# Patient Record
Sex: Male | Born: 1940 | Race: Black or African American | Hispanic: No | Smoking: Current some day smoker
Health system: Southern US, Community
[De-identification: ages and names within clinical notes are randomized; demographics above are authoritative.]

## PROBLEM LIST (undated history)

## (undated) DIAGNOSIS — Z8709 Personal history of other diseases of the respiratory system: Secondary | ICD-10-CM

## (undated) DIAGNOSIS — R0602 Shortness of breath: Secondary | ICD-10-CM

## (undated) DIAGNOSIS — R200 Anesthesia of skin: Secondary | ICD-10-CM

## (undated) DIAGNOSIS — I10 Essential (primary) hypertension: Secondary | ICD-10-CM

## (undated) DIAGNOSIS — J449 Chronic obstructive pulmonary disease, unspecified: Secondary | ICD-10-CM

## (undated) DIAGNOSIS — F419 Anxiety disorder, unspecified: Secondary | ICD-10-CM

## (undated) DIAGNOSIS — Z9289 Personal history of other medical treatment: Secondary | ICD-10-CM

## (undated) DIAGNOSIS — I5030 Unspecified diastolic (congestive) heart failure: Secondary | ICD-10-CM

## (undated) DIAGNOSIS — I4891 Unspecified atrial fibrillation: Secondary | ICD-10-CM

## (undated) DIAGNOSIS — D649 Anemia, unspecified: Secondary | ICD-10-CM

## (undated) DIAGNOSIS — R918 Other nonspecific abnormal finding of lung field: Secondary | ICD-10-CM

## (undated) DIAGNOSIS — I272 Pulmonary hypertension, unspecified: Secondary | ICD-10-CM

## (undated) DIAGNOSIS — J45909 Unspecified asthma, uncomplicated: Secondary | ICD-10-CM

## (undated) HISTORY — PX: EYE SURGERY: SHX253

## (undated) HISTORY — PX: NOSE SURGERY: SHX723

## (undated) HISTORY — PX: HERNIA REPAIR: SHX51

## (undated) HISTORY — PX: OTHER SURGICAL HISTORY: SHX169

## (undated) HISTORY — DX: Anemia, unspecified: D64.9

## (undated) HISTORY — DX: Chronic obstructive pulmonary disease, unspecified: J44.9

## (undated) HISTORY — DX: Unspecified atrial fibrillation: I48.91

## (undated) HISTORY — PX: TENDON REPAIR: SHX5111

---

## 2003-01-06 ENCOUNTER — Emergency Department (HOSPITAL_COMMUNITY): Admission: EM | Admit: 2003-01-06 | Discharge: 2003-01-06 | Payer: Self-pay | Admitting: Emergency Medicine

## 2003-01-07 ENCOUNTER — Emergency Department (HOSPITAL_COMMUNITY): Admission: EM | Admit: 2003-01-07 | Discharge: 2003-01-07 | Payer: Self-pay | Admitting: Emergency Medicine

## 2003-01-08 ENCOUNTER — Emergency Department (HOSPITAL_COMMUNITY): Admission: EM | Admit: 2003-01-08 | Discharge: 2003-01-08 | Payer: Self-pay | Admitting: Emergency Medicine

## 2003-01-27 ENCOUNTER — Ambulatory Visit (HOSPITAL_COMMUNITY): Admission: RE | Admit: 2003-01-27 | Discharge: 2003-01-27 | Payer: Self-pay | Admitting: General Surgery

## 2003-03-03 ENCOUNTER — Emergency Department (HOSPITAL_COMMUNITY): Admission: EM | Admit: 2003-03-03 | Discharge: 2003-03-03 | Payer: Self-pay | Admitting: Emergency Medicine

## 2003-08-18 ENCOUNTER — Emergency Department (HOSPITAL_COMMUNITY): Admission: EM | Admit: 2003-08-18 | Discharge: 2003-08-18 | Payer: Self-pay | Admitting: Emergency Medicine

## 2004-06-10 ENCOUNTER — Encounter (INDEPENDENT_AMBULATORY_CARE_PROVIDER_SITE_OTHER): Payer: Self-pay | Admitting: Internal Medicine

## 2004-06-10 LAB — CONVERTED CEMR LAB
Cholesterol: 135 mg/dL
Triglycerides: 89 mg/dL

## 2005-06-27 ENCOUNTER — Ambulatory Visit (HOSPITAL_COMMUNITY): Admission: RE | Admit: 2005-06-27 | Discharge: 2005-06-27 | Payer: Self-pay | Admitting: General Surgery

## 2005-06-30 ENCOUNTER — Ambulatory Visit (HOSPITAL_COMMUNITY): Admission: RE | Admit: 2005-06-30 | Discharge: 2005-06-30 | Payer: Self-pay | Admitting: General Surgery

## 2005-06-30 ENCOUNTER — Ambulatory Visit: Payer: Self-pay | Admitting: Cardiology

## 2006-01-24 ENCOUNTER — Ambulatory Visit: Payer: Self-pay | Admitting: Internal Medicine

## 2006-01-24 LAB — CONVERTED CEMR LAB
BUN: 11 mg/dL
Basophils Absolute: 0 10*3/uL
Basophils Relative: 1 %
Calcium: 9.6 mg/dL
Creatinine, Ser: 1.03 mg/dL
Eosinophils Absolute: 0.2 10*3/uL
Eosinophils Relative: 4 %
HCT: 40.5 %
Hemoglobin: 12.6 g/dL
MCV: 89.4 fL
Monocytes Absolute: 0.8 10*3/uL
RBC count: 4.53 10*6/uL
RBC: 4.53 M/uL
WBC, blood: 4.6 10*3/uL
WBC: 4.6 10*3/uL

## 2006-02-13 ENCOUNTER — Encounter: Payer: Self-pay | Admitting: Internal Medicine

## 2006-02-13 DIAGNOSIS — D649 Anemia, unspecified: Secondary | ICD-10-CM

## 2006-02-13 DIAGNOSIS — J309 Allergic rhinitis, unspecified: Secondary | ICD-10-CM | POA: Insufficient documentation

## 2006-02-13 DIAGNOSIS — I4891 Unspecified atrial fibrillation: Secondary | ICD-10-CM | POA: Insufficient documentation

## 2006-02-13 DIAGNOSIS — I10 Essential (primary) hypertension: Secondary | ICD-10-CM | POA: Insufficient documentation

## 2006-03-20 ENCOUNTER — Ambulatory Visit: Payer: Self-pay | Admitting: Internal Medicine

## 2006-03-27 ENCOUNTER — Ambulatory Visit: Payer: Self-pay | Admitting: Internal Medicine

## 2006-04-03 ENCOUNTER — Ambulatory Visit: Payer: Self-pay | Admitting: Internal Medicine

## 2006-04-03 LAB — CONVERTED CEMR LAB: Prothrombin Time: 26.1 s

## 2006-04-12 ENCOUNTER — Ambulatory Visit (HOSPITAL_COMMUNITY): Admission: RE | Admit: 2006-04-12 | Discharge: 2006-04-12 | Payer: Self-pay | Admitting: Internal Medicine

## 2006-04-12 ENCOUNTER — Ambulatory Visit: Payer: Self-pay | Admitting: Internal Medicine

## 2006-05-03 ENCOUNTER — Encounter (INDEPENDENT_AMBULATORY_CARE_PROVIDER_SITE_OTHER): Payer: Self-pay | Admitting: Family Medicine

## 2006-05-03 ENCOUNTER — Ambulatory Visit: Payer: Self-pay | Admitting: Internal Medicine

## 2006-05-03 DIAGNOSIS — R109 Unspecified abdominal pain: Secondary | ICD-10-CM | POA: Insufficient documentation

## 2006-05-03 DIAGNOSIS — I509 Heart failure, unspecified: Secondary | ICD-10-CM

## 2006-05-03 LAB — CONVERTED CEMR LAB
INR: 1.3
Prothrombin Time: 14.2 s

## 2006-05-10 ENCOUNTER — Encounter (INDEPENDENT_AMBULATORY_CARE_PROVIDER_SITE_OTHER): Payer: Self-pay | Admitting: Internal Medicine

## 2006-05-11 ENCOUNTER — Ambulatory Visit (HOSPITAL_COMMUNITY): Admission: RE | Admit: 2006-05-11 | Discharge: 2006-05-11 | Payer: Self-pay | Admitting: Internal Medicine

## 2006-05-11 ENCOUNTER — Ambulatory Visit: Payer: Self-pay | Admitting: Internal Medicine

## 2006-05-11 DIAGNOSIS — J441 Chronic obstructive pulmonary disease with (acute) exacerbation: Secondary | ICD-10-CM

## 2006-05-11 DIAGNOSIS — R609 Edema, unspecified: Secondary | ICD-10-CM | POA: Insufficient documentation

## 2006-05-11 LAB — CONVERTED CEMR LAB: INR: 1.1

## 2006-05-14 ENCOUNTER — Encounter (INDEPENDENT_AMBULATORY_CARE_PROVIDER_SITE_OTHER): Payer: Self-pay | Admitting: *Deleted

## 2006-05-21 ENCOUNTER — Ambulatory Visit: Payer: Self-pay | Admitting: Internal Medicine

## 2006-05-21 DIAGNOSIS — F528 Other sexual dysfunction not due to a substance or known physiological condition: Secondary | ICD-10-CM

## 2006-05-21 LAB — CONVERTED CEMR LAB
INR: 1.2
Prothrombin Time: 13.4 s

## 2006-05-22 ENCOUNTER — Telehealth (INDEPENDENT_AMBULATORY_CARE_PROVIDER_SITE_OTHER): Payer: Self-pay | Admitting: *Deleted

## 2006-05-23 ENCOUNTER — Telehealth (INDEPENDENT_AMBULATORY_CARE_PROVIDER_SITE_OTHER): Payer: Self-pay | Admitting: Internal Medicine

## 2006-05-26 ENCOUNTER — Inpatient Hospital Stay (HOSPITAL_COMMUNITY): Admission: EM | Admit: 2006-05-26 | Discharge: 2006-05-31 | Payer: Self-pay | Admitting: Emergency Medicine

## 2006-05-28 ENCOUNTER — Ambulatory Visit: Payer: Self-pay | Admitting: Cardiology

## 2006-05-28 ENCOUNTER — Telehealth (INDEPENDENT_AMBULATORY_CARE_PROVIDER_SITE_OTHER): Payer: Self-pay | Admitting: Internal Medicine

## 2006-05-30 LAB — CONVERTED CEMR LAB
Basophils Absolute: 0 10*3/uL
Basophils Relative: 0 %
Eosinophils Absolute: 0 10*3/uL
Eosinophils Relative: 0 %
HCT: 40.2 %
Lymphocytes Relative: 1 %
MCHC: 30.9 g/dL
Monocytes Absolute: 0.5 10*3/uL
Platelets: 160 10*3/uL
RDW: 14.7 %

## 2006-05-31 LAB — CONVERTED CEMR LAB
Creatinine, Ser: 1.01 mg/dL
GFR calc Af Amer: >60 mL/min
GFR calc non Af Amer: >60 mL/min
Glucose, Bld: 125 mg/dL
INR: 2.1
Sodium: 141 meq/L

## 2006-06-18 ENCOUNTER — Ambulatory Visit: Payer: Self-pay | Admitting: Internal Medicine

## 2006-06-22 ENCOUNTER — Ambulatory Visit: Payer: Self-pay | Admitting: Internal Medicine

## 2006-06-22 DIAGNOSIS — G4700 Insomnia, unspecified: Secondary | ICD-10-CM

## 2006-06-22 LAB — CONVERTED CEMR LAB: INR: 1.5

## 2006-07-03 ENCOUNTER — Telehealth (INDEPENDENT_AMBULATORY_CARE_PROVIDER_SITE_OTHER): Payer: Self-pay | Admitting: Internal Medicine

## 2006-07-05 ENCOUNTER — Encounter (INDEPENDENT_AMBULATORY_CARE_PROVIDER_SITE_OTHER): Payer: Self-pay | Admitting: Internal Medicine

## 2006-07-20 ENCOUNTER — Ambulatory Visit: Payer: Self-pay | Admitting: Internal Medicine

## 2006-08-17 ENCOUNTER — Ambulatory Visit: Payer: Self-pay | Admitting: Internal Medicine

## 2006-08-22 ENCOUNTER — Ambulatory Visit: Payer: Self-pay | Admitting: Internal Medicine

## 2006-08-23 ENCOUNTER — Ambulatory Visit (HOSPITAL_COMMUNITY): Admission: RE | Admit: 2006-08-23 | Discharge: 2006-08-23 | Payer: Self-pay | Admitting: Internal Medicine

## 2006-08-29 ENCOUNTER — Ambulatory Visit: Payer: Self-pay | Admitting: Cardiology

## 2006-08-31 ENCOUNTER — Ambulatory Visit: Payer: Self-pay | Admitting: Internal Medicine

## 2006-08-31 LAB — CONVERTED CEMR LAB: Prothrombin Time: 22.2 s

## 2006-09-10 ENCOUNTER — Ambulatory Visit: Payer: Self-pay | Admitting: Internal Medicine

## 2006-09-10 LAB — CONVERTED CEMR LAB: INR: 2.9

## 2006-10-01 ENCOUNTER — Ambulatory Visit: Payer: Self-pay | Admitting: Internal Medicine

## 2006-10-19 ENCOUNTER — Ambulatory Visit (HOSPITAL_COMMUNITY): Admission: RE | Admit: 2006-10-19 | Discharge: 2006-10-19 | Payer: Self-pay | Admitting: Internal Medicine

## 2006-10-19 ENCOUNTER — Ambulatory Visit: Payer: Self-pay | Admitting: Internal Medicine

## 2006-10-19 LAB — CONVERTED CEMR LAB
INR: 1.7
Prothrombin Time: 16 s

## 2006-11-01 ENCOUNTER — Ambulatory Visit: Payer: Self-pay | Admitting: Internal Medicine

## 2006-11-01 LAB — CONVERTED CEMR LAB
INR: 1.6
Prothrombin Time: 15.7 s

## 2006-11-14 ENCOUNTER — Ambulatory Visit: Payer: Self-pay | Admitting: Family Medicine

## 2006-11-14 LAB — CONVERTED CEMR LAB
INR: 1.2
Prothrombin Time: 13.6 s

## 2006-11-26 ENCOUNTER — Ambulatory Visit: Payer: Self-pay | Admitting: Internal Medicine

## 2006-11-26 LAB — CONVERTED CEMR LAB
INR: 4.6
INR: 4.1 — ABNORMAL HIGH (ref 0.0–1.5)
Prothrombin Time: 42.4 s — ABNORMAL HIGH (ref 11.6–15.2)

## 2006-12-06 ENCOUNTER — Ambulatory Visit: Payer: Self-pay | Admitting: Internal Medicine

## 2006-12-06 LAB — CONVERTED CEMR LAB: Prothrombin Time: 12.9 s

## 2006-12-07 ENCOUNTER — Encounter (INDEPENDENT_AMBULATORY_CARE_PROVIDER_SITE_OTHER): Payer: Self-pay | Admitting: Internal Medicine

## 2006-12-17 ENCOUNTER — Ambulatory Visit: Payer: Self-pay | Admitting: Internal Medicine

## 2006-12-31 ENCOUNTER — Ambulatory Visit: Payer: Self-pay | Admitting: Internal Medicine

## 2007-01-01 ENCOUNTER — Telehealth (INDEPENDENT_AMBULATORY_CARE_PROVIDER_SITE_OTHER): Payer: Self-pay | Admitting: Internal Medicine

## 2007-01-10 ENCOUNTER — Emergency Department (HOSPITAL_COMMUNITY): Admission: EM | Admit: 2007-01-10 | Discharge: 2007-01-10 | Payer: Self-pay | Admitting: Emergency Medicine

## 2007-01-11 ENCOUNTER — Emergency Department (HOSPITAL_COMMUNITY): Admission: EM | Admit: 2007-01-11 | Discharge: 2007-01-11 | Payer: Self-pay | Admitting: Emergency Medicine

## 2007-01-11 ENCOUNTER — Ambulatory Visit: Payer: Self-pay | Admitting: Internal Medicine

## 2007-01-11 LAB — CONVERTED CEMR LAB
INR: 2.6
Prothrombin Time: 19.5 s

## 2007-01-12 ENCOUNTER — Emergency Department (HOSPITAL_COMMUNITY): Admission: EM | Admit: 2007-01-12 | Discharge: 2007-01-12 | Payer: Self-pay | Admitting: *Deleted

## 2007-01-14 ENCOUNTER — Emergency Department (HOSPITAL_COMMUNITY): Admission: EM | Admit: 2007-01-14 | Discharge: 2007-01-14 | Payer: Self-pay | Admitting: Emergency Medicine

## 2007-01-21 ENCOUNTER — Ambulatory Visit: Payer: Self-pay | Admitting: Internal Medicine

## 2007-01-21 LAB — CONVERTED CEMR LAB: INR: 1.9

## 2007-01-22 ENCOUNTER — Telehealth (INDEPENDENT_AMBULATORY_CARE_PROVIDER_SITE_OTHER): Payer: Self-pay | Admitting: *Deleted

## 2007-01-22 ENCOUNTER — Encounter (INDEPENDENT_AMBULATORY_CARE_PROVIDER_SITE_OTHER): Payer: Self-pay | Admitting: Internal Medicine

## 2007-01-22 LAB — CONVERTED CEMR LAB
Calcium: 8.9 mg/dL (ref 8.4–10.5)
Cholesterol: 154 mg/dL (ref 0–200)
Glucose, Bld: 123 mg/dL — ABNORMAL HIGH (ref 70–99)
HDL: 37 mg/dL — ABNORMAL LOW (ref >39)
Sodium: 141 meq/L (ref 135–145)
Total CHOL/HDL Ratio: 4.2

## 2007-01-28 ENCOUNTER — Ambulatory Visit: Payer: Self-pay | Admitting: Internal Medicine

## 2007-02-04 ENCOUNTER — Ambulatory Visit: Payer: Self-pay | Admitting: Internal Medicine

## 2007-02-04 LAB — CONVERTED CEMR LAB: INR: 1.6

## 2007-02-15 ENCOUNTER — Ambulatory Visit: Payer: Self-pay | Admitting: Internal Medicine

## 2007-02-26 ENCOUNTER — Encounter (INDEPENDENT_AMBULATORY_CARE_PROVIDER_SITE_OTHER): Payer: Self-pay | Admitting: Internal Medicine

## 2007-03-06 ENCOUNTER — Ambulatory Visit: Payer: Self-pay | Admitting: Internal Medicine

## 2007-03-06 LAB — CONVERTED CEMR LAB
INR: 2.5
Prothrombin Time: 19.2 s

## 2007-03-11 ENCOUNTER — Encounter (INDEPENDENT_AMBULATORY_CARE_PROVIDER_SITE_OTHER): Payer: Self-pay | Admitting: Internal Medicine

## 2007-04-03 ENCOUNTER — Encounter (INDEPENDENT_AMBULATORY_CARE_PROVIDER_SITE_OTHER): Payer: Self-pay | Admitting: Internal Medicine

## 2007-04-04 ENCOUNTER — Inpatient Hospital Stay (HOSPITAL_COMMUNITY): Admission: EM | Admit: 2007-04-04 | Discharge: 2007-04-06 | Payer: Self-pay | Admitting: Emergency Medicine

## 2007-04-04 ENCOUNTER — Ambulatory Visit: Payer: Self-pay | Admitting: Internal Medicine

## 2007-04-05 LAB — CONVERTED CEMR LAB
Cholesterol: 146 mg/dL
TSH: 0.394 microintl units/mL
Triglycerides: 61 mg/dL

## 2007-04-06 LAB — CONVERTED CEMR LAB
BUN: 34 mg/dL
Basophils Absolute: 0 10*3/uL
Basophils Relative: 0 %
CO2: 32 meq/L
Chloride: 96 meq/L
Creatinine, Ser: 1.75 mg/dL
Glucose, Bld: 141 mg/dL
MCHC: 32.1 g/dL
Neutro Abs: 10 10*3/uL
Neutrophils Relative %: 92 %
Platelets: 244 10*3/uL
Prothrombin Time: 23.5 s
RBC: 4.03 M/uL
RDW: 14.6 %

## 2007-04-15 ENCOUNTER — Ambulatory Visit: Payer: Self-pay | Admitting: Internal Medicine

## 2007-04-15 LAB — CONVERTED CEMR LAB
ALT: 31 units/L (ref 0–53)
AST: 23 units/L (ref 0–37)
BUN: 19 mg/dL (ref 6–23)
Basophils Absolute: 0 10*3/uL (ref 0.0–0.1)
Basophils Relative: 0 % (ref 0–1)
CO2: 36 meq/L — ABNORMAL HIGH (ref 19–32)
Calcium: 9.5 mg/dL (ref 8.4–10.5)
Creatinine, Ser: 1.08 mg/dL (ref 0.40–1.50)
Eosinophils Relative: 3 % (ref 0–5)
HCT: 40.4 % (ref 39.0–52.0)
Hemoglobin: 13 g/dL (ref 13.0–17.0)
MCHC: 32.3 g/dL (ref 30.0–36.0)
Monocytes Absolute: 1.3 10*3/uL — ABNORMAL HIGH (ref 0.1–1.0)
Monocytes Relative: 14 % — ABNORMAL HIGH (ref 3–12)
Prothrombin Time: 46.4 s — ABNORMAL HIGH (ref 11.6–15.2)
RBC: 4.46 M/uL (ref 4.22–5.81)
RDW: 14.8 % (ref 11.5–15.5)
Total Bilirubin: 0.7 mg/dL (ref 0.3–1.2)

## 2007-04-22 ENCOUNTER — Ambulatory Visit: Payer: Self-pay | Admitting: Internal Medicine

## 2007-05-06 ENCOUNTER — Ambulatory Visit: Payer: Self-pay | Admitting: Internal Medicine

## 2007-05-06 LAB — CONVERTED CEMR LAB
Bilirubin Urine: NEGATIVE
INR: 3.4
Protein, U semiquant: NEGATIVE
Prothrombin Time: 22.4 s
Urobilinogen, UA: NEGATIVE

## 2007-05-24 ENCOUNTER — Ambulatory Visit: Payer: Self-pay | Admitting: Internal Medicine

## 2007-05-24 LAB — CONVERTED CEMR LAB: INR: 1.6

## 2007-06-03 ENCOUNTER — Ambulatory Visit: Payer: Self-pay | Admitting: Internal Medicine

## 2007-06-03 LAB — CONVERTED CEMR LAB: Prothrombin Time: 17.7 s

## 2007-06-21 ENCOUNTER — Ambulatory Visit: Payer: Self-pay | Admitting: Internal Medicine

## 2007-06-21 LAB — CONVERTED CEMR LAB: INR: 1.5

## 2007-07-08 ENCOUNTER — Ambulatory Visit: Payer: Self-pay | Admitting: Internal Medicine

## 2007-07-24 ENCOUNTER — Ambulatory Visit: Payer: Self-pay | Admitting: Internal Medicine

## 2007-07-24 DIAGNOSIS — R05 Cough: Secondary | ICD-10-CM

## 2007-07-24 LAB — CONVERTED CEMR LAB: INR: 2.3

## 2007-07-25 ENCOUNTER — Ambulatory Visit (HOSPITAL_COMMUNITY): Admission: RE | Admit: 2007-07-25 | Discharge: 2007-07-25 | Payer: Self-pay | Admitting: Ophthalmology

## 2007-08-14 ENCOUNTER — Ambulatory Visit: Payer: Self-pay | Admitting: Internal Medicine

## 2007-08-14 LAB — CONVERTED CEMR LAB
INR: 2.3
Prothrombin Time: 18.6 s

## 2007-08-19 ENCOUNTER — Encounter (INDEPENDENT_AMBULATORY_CARE_PROVIDER_SITE_OTHER): Payer: Self-pay | Admitting: Internal Medicine

## 2007-09-04 ENCOUNTER — Ambulatory Visit: Payer: Self-pay | Admitting: Internal Medicine

## 2007-09-04 LAB — CONVERTED CEMR LAB
INR: 3
Prothrombin Time: 21 s

## 2007-09-09 ENCOUNTER — Encounter (INDEPENDENT_AMBULATORY_CARE_PROVIDER_SITE_OTHER): Payer: Self-pay | Admitting: Internal Medicine

## 2007-09-26 ENCOUNTER — Ambulatory Visit: Payer: Self-pay | Admitting: Internal Medicine

## 2007-10-10 ENCOUNTER — Ambulatory Visit: Payer: Self-pay | Admitting: Internal Medicine

## 2007-10-28 ENCOUNTER — Ambulatory Visit: Payer: Self-pay | Admitting: Internal Medicine

## 2007-10-28 LAB — CONVERTED CEMR LAB: INR: 3.9

## 2007-11-01 ENCOUNTER — Encounter (INDEPENDENT_AMBULATORY_CARE_PROVIDER_SITE_OTHER): Payer: Self-pay | Admitting: Internal Medicine

## 2007-11-11 ENCOUNTER — Ambulatory Visit: Payer: Self-pay | Admitting: Internal Medicine

## 2007-11-25 ENCOUNTER — Ambulatory Visit: Payer: Self-pay | Admitting: Internal Medicine

## 2007-11-27 ENCOUNTER — Encounter (INDEPENDENT_AMBULATORY_CARE_PROVIDER_SITE_OTHER): Payer: Self-pay | Admitting: Internal Medicine

## 2007-12-16 ENCOUNTER — Ambulatory Visit: Payer: Self-pay | Admitting: Internal Medicine

## 2007-12-17 ENCOUNTER — Encounter (INDEPENDENT_AMBULATORY_CARE_PROVIDER_SITE_OTHER): Payer: Self-pay | Admitting: Internal Medicine

## 2007-12-18 ENCOUNTER — Encounter (INDEPENDENT_AMBULATORY_CARE_PROVIDER_SITE_OTHER): Payer: Self-pay | Admitting: Internal Medicine

## 2007-12-18 LAB — CONVERTED CEMR LAB
BUN: 12 mg/dL (ref 6–23)
CO2: 23 meq/L (ref 19–32)
Chloride: 106 meq/L (ref 96–112)
Glucose, Bld: 91 mg/dL (ref 70–99)
Potassium: 4.2 meq/L (ref 3.5–5.3)

## 2007-12-28 ENCOUNTER — Emergency Department (HOSPITAL_COMMUNITY): Admission: EM | Admit: 2007-12-28 | Discharge: 2007-12-28 | Payer: Self-pay | Admitting: Emergency Medicine

## 2008-01-08 ENCOUNTER — Telehealth (INDEPENDENT_AMBULATORY_CARE_PROVIDER_SITE_OTHER): Payer: Self-pay | Admitting: Internal Medicine

## 2008-01-10 ENCOUNTER — Encounter (INDEPENDENT_AMBULATORY_CARE_PROVIDER_SITE_OTHER): Payer: Self-pay | Admitting: Internal Medicine

## 2008-01-15 ENCOUNTER — Ambulatory Visit: Payer: Self-pay | Admitting: Internal Medicine

## 2008-01-23 ENCOUNTER — Ambulatory Visit (HOSPITAL_COMMUNITY): Admission: RE | Admit: 2008-01-23 | Discharge: 2008-01-23 | Payer: Self-pay | Admitting: Ophthalmology

## 2008-01-29 ENCOUNTER — Ambulatory Visit: Payer: Self-pay | Admitting: Internal Medicine

## 2008-02-12 ENCOUNTER — Ambulatory Visit: Payer: Self-pay | Admitting: Internal Medicine

## 2008-02-26 ENCOUNTER — Ambulatory Visit: Payer: Self-pay | Admitting: Internal Medicine

## 2008-02-26 ENCOUNTER — Ambulatory Visit (HOSPITAL_COMMUNITY): Admission: RE | Admit: 2008-02-26 | Discharge: 2008-02-26 | Payer: Self-pay | Admitting: Internal Medicine

## 2008-02-26 DIAGNOSIS — R0602 Shortness of breath: Secondary | ICD-10-CM

## 2008-02-26 LAB — CONVERTED CEMR LAB: INR: 1.3

## 2008-03-03 ENCOUNTER — Ambulatory Visit: Payer: Self-pay | Admitting: Internal Medicine

## 2008-03-03 LAB — CONVERTED CEMR LAB: INR: 1.5

## 2008-03-16 ENCOUNTER — Emergency Department (HOSPITAL_COMMUNITY): Admission: EM | Admit: 2008-03-16 | Discharge: 2008-03-16 | Payer: Self-pay | Admitting: Emergency Medicine

## 2008-03-17 ENCOUNTER — Ambulatory Visit: Payer: Self-pay | Admitting: Internal Medicine

## 2008-03-17 ENCOUNTER — Ambulatory Visit (HOSPITAL_COMMUNITY): Admission: RE | Admit: 2008-03-17 | Discharge: 2008-03-17 | Payer: Self-pay | Admitting: Internal Medicine

## 2008-03-17 DIAGNOSIS — Z9189 Other specified personal risk factors, not elsewhere classified: Secondary | ICD-10-CM

## 2008-03-17 LAB — CONVERTED CEMR LAB: INR: 1.5

## 2008-03-23 ENCOUNTER — Encounter (INDEPENDENT_AMBULATORY_CARE_PROVIDER_SITE_OTHER): Payer: Self-pay | Admitting: Internal Medicine

## 2008-04-02 ENCOUNTER — Ambulatory Visit: Payer: Self-pay | Admitting: Internal Medicine

## 2008-04-17 ENCOUNTER — Ambulatory Visit: Payer: Self-pay | Admitting: Cardiology

## 2008-04-17 ENCOUNTER — Inpatient Hospital Stay (HOSPITAL_COMMUNITY): Admission: EM | Admit: 2008-04-17 | Discharge: 2008-04-21 | Payer: Self-pay | Admitting: Emergency Medicine

## 2008-04-20 ENCOUNTER — Encounter (INDEPENDENT_AMBULATORY_CARE_PROVIDER_SITE_OTHER): Payer: Self-pay | Admitting: Family Medicine

## 2008-04-21 LAB — CONVERTED CEMR LAB
BUN: 29 mg/dL
Basophils Absolute: 0 10*3/uL
Basophils Relative: 0 %
CO2: 34 meq/L
Chloride: 98 meq/L
Cholesterol: 116 mg/dL
Creatinine, Ser: 1.35 mg/dL
Eosinophils Absolute: 0 10*3/uL
Glucose, Bld: 128 mg/dL
HDL: 16 mg/dL
MCHC: 32.1 g/dL
MCV: 92.3 fL
Monocytes Relative: 2 %
Neutro Abs: 18.4 10*3/uL
Neutrophils Relative %: 97 %
Platelets: 458 10*3/uL
Potassium: 4.4 meq/L
RBC: 3.92 M/uL
RDW: 14.8 %
Total CHOL/HDL Ratio: 7.3
Triglycerides: 78 mg/dL

## 2008-04-24 ENCOUNTER — Ambulatory Visit: Payer: Self-pay | Admitting: Internal Medicine

## 2008-04-24 LAB — CONVERTED CEMR LAB
INR: 3.4
TSH: 0.618 microintl units/mL

## 2008-04-30 ENCOUNTER — Ambulatory Visit: Payer: Self-pay | Admitting: Cardiology

## 2008-05-06 ENCOUNTER — Ambulatory Visit (HOSPITAL_COMMUNITY): Admission: RE | Admit: 2008-05-06 | Discharge: 2008-05-06 | Payer: Self-pay | Admitting: Cardiology

## 2008-05-08 ENCOUNTER — Ambulatory Visit: Payer: Self-pay | Admitting: Internal Medicine

## 2008-05-08 DIAGNOSIS — M545 Low back pain, unspecified: Secondary | ICD-10-CM | POA: Insufficient documentation

## 2008-05-22 ENCOUNTER — Ambulatory Visit: Payer: Self-pay | Admitting: Internal Medicine

## 2008-06-05 ENCOUNTER — Ambulatory Visit: Payer: Self-pay | Admitting: Internal Medicine

## 2008-06-05 LAB — CONVERTED CEMR LAB: INR: 2.6

## 2008-06-25 ENCOUNTER — Ambulatory Visit: Payer: Self-pay | Admitting: Family Medicine

## 2008-06-25 ENCOUNTER — Encounter (INDEPENDENT_AMBULATORY_CARE_PROVIDER_SITE_OTHER): Payer: Self-pay | Admitting: Internal Medicine

## 2008-06-25 LAB — CONVERTED CEMR LAB: INR: 2.8

## 2008-07-23 ENCOUNTER — Ambulatory Visit: Payer: Self-pay | Admitting: Internal Medicine

## 2008-07-23 LAB — CONVERTED CEMR LAB: INR: 2

## 2008-08-20 ENCOUNTER — Ambulatory Visit: Payer: Self-pay | Admitting: Internal Medicine

## 2008-09-03 ENCOUNTER — Ambulatory Visit: Payer: Self-pay | Admitting: Internal Medicine

## 2008-09-03 LAB — CONVERTED CEMR LAB: INR: 5

## 2008-09-17 ENCOUNTER — Ambulatory Visit: Payer: Self-pay | Admitting: Internal Medicine

## 2008-10-01 ENCOUNTER — Ambulatory Visit: Payer: Self-pay | Admitting: Internal Medicine

## 2008-10-01 DIAGNOSIS — M6530 Trigger finger, unspecified finger: Secondary | ICD-10-CM | POA: Insufficient documentation

## 2008-10-01 LAB — CONVERTED CEMR LAB: INR: 5.5

## 2008-10-02 ENCOUNTER — Encounter (INDEPENDENT_AMBULATORY_CARE_PROVIDER_SITE_OTHER): Payer: Self-pay | Admitting: Internal Medicine

## 2008-10-02 LAB — CONVERTED CEMR LAB
BUN: 11 mg/dL (ref 6–23)
Basophils Absolute: 0 10*3/uL (ref 0.0–0.1)
Basophils Relative: 1 % (ref 0–1)
CO2: 26 meq/L (ref 19–32)
Calcium: 9 mg/dL (ref 8.4–10.5)
Chloride: 105 meq/L (ref 96–112)
Creatinine, Ser: 1.07 mg/dL (ref 0.40–1.50)
Eosinophils Relative: 3 % (ref 0–5)
HCT: 39.6 % (ref 39.0–52.0)
Hemoglobin: 12.2 g/dL — ABNORMAL LOW (ref 13.0–17.0)
MCHC: 30.8 g/dL (ref 30.0–36.0)
Monocytes Absolute: 0.7 10*3/uL (ref 0.1–1.0)
Neutro Abs: 2.1 10*3/uL (ref 1.7–7.7)
Platelets: 213 10*3/uL (ref 150–400)
RDW: 13.8 % (ref 11.5–15.5)

## 2008-10-14 ENCOUNTER — Ambulatory Visit: Payer: Self-pay | Admitting: Internal Medicine

## 2008-10-28 ENCOUNTER — Ambulatory Visit: Payer: Self-pay | Admitting: Internal Medicine

## 2008-10-28 LAB — CONVERTED CEMR LAB: INR: 2.9

## 2008-11-16 ENCOUNTER — Encounter (INDEPENDENT_AMBULATORY_CARE_PROVIDER_SITE_OTHER): Payer: Self-pay | Admitting: Internal Medicine

## 2008-11-18 ENCOUNTER — Ambulatory Visit: Payer: Self-pay | Admitting: Internal Medicine

## 2008-11-18 LAB — CONVERTED CEMR LAB: INR: 3.1

## 2008-11-26 ENCOUNTER — Encounter (INDEPENDENT_AMBULATORY_CARE_PROVIDER_SITE_OTHER): Payer: Self-pay | Admitting: Internal Medicine

## 2008-12-02 ENCOUNTER — Ambulatory Visit: Payer: Self-pay | Admitting: Internal Medicine

## 2008-12-02 LAB — CONVERTED CEMR LAB: INR: 2.6

## 2008-12-07 ENCOUNTER — Encounter (INDEPENDENT_AMBULATORY_CARE_PROVIDER_SITE_OTHER): Payer: Self-pay | Admitting: Internal Medicine

## 2008-12-09 ENCOUNTER — Ambulatory Visit: Payer: Self-pay | Admitting: Cardiology

## 2008-12-16 ENCOUNTER — Inpatient Hospital Stay (HOSPITAL_COMMUNITY): Admission: EM | Admit: 2008-12-16 | Discharge: 2008-12-17 | Payer: Self-pay | Admitting: Emergency Medicine

## 2008-12-16 ENCOUNTER — Encounter (INDEPENDENT_AMBULATORY_CARE_PROVIDER_SITE_OTHER): Payer: Self-pay | Admitting: *Deleted

## 2008-12-16 LAB — CONVERTED CEMR LAB
Brain Natriuretic Peptide: 503
CK-MB: 2.7 ng/mL
Calcium: 9.4 mg/dL
Chloride: 102 meq/L
Glomerular Filtration Rate, Af Am: >60 mL/min/{1.73_m2}
MCV: 90.9 fL
Platelets: 164 10*3/uL
Potassium: 4 meq/L
Sodium: 139 meq/L
Troponin I: <0.05 ng/mL

## 2008-12-17 ENCOUNTER — Encounter: Payer: Self-pay | Admitting: Family Medicine

## 2008-12-24 ENCOUNTER — Encounter (INDEPENDENT_AMBULATORY_CARE_PROVIDER_SITE_OTHER): Payer: Self-pay | Admitting: *Deleted

## 2008-12-24 ENCOUNTER — Ambulatory Visit: Payer: Self-pay | Admitting: Cardiology

## 2008-12-24 LAB — CONVERTED CEMR LAB: POC INR: 5.8

## 2008-12-28 ENCOUNTER — Ambulatory Visit: Payer: Self-pay | Admitting: Cardiology

## 2008-12-28 LAB — CONVERTED CEMR LAB: POC INR: 1.9

## 2009-01-13 ENCOUNTER — Ambulatory Visit: Payer: Self-pay | Admitting: Cardiology

## 2009-01-29 ENCOUNTER — Ambulatory Visit: Payer: Self-pay | Admitting: Cardiology

## 2009-02-15 ENCOUNTER — Encounter: Payer: Self-pay | Admitting: Family Medicine

## 2009-03-04 ENCOUNTER — Ambulatory Visit: Payer: Self-pay | Admitting: Cardiology

## 2009-03-24 ENCOUNTER — Ambulatory Visit: Payer: Self-pay | Admitting: Cardiology

## 2009-03-24 ENCOUNTER — Ambulatory Visit: Payer: Self-pay | Admitting: Family Medicine

## 2009-03-25 ENCOUNTER — Encounter (INDEPENDENT_AMBULATORY_CARE_PROVIDER_SITE_OTHER): Payer: Self-pay | Admitting: *Deleted

## 2009-03-25 ENCOUNTER — Inpatient Hospital Stay (HOSPITAL_COMMUNITY): Admission: EM | Admit: 2009-03-25 | Discharge: 2009-03-27 | Payer: Self-pay | Admitting: Emergency Medicine

## 2009-03-25 ENCOUNTER — Ambulatory Visit: Payer: Self-pay | Admitting: Cardiology

## 2009-03-26 ENCOUNTER — Encounter (INDEPENDENT_AMBULATORY_CARE_PROVIDER_SITE_OTHER): Payer: Self-pay | Admitting: *Deleted

## 2009-03-26 ENCOUNTER — Encounter: Payer: Self-pay | Admitting: Cardiology

## 2009-03-26 ENCOUNTER — Encounter (INDEPENDENT_AMBULATORY_CARE_PROVIDER_SITE_OTHER): Payer: Self-pay | Admitting: Internal Medicine

## 2009-03-26 LAB — CONVERTED CEMR LAB
Albumin: 3.6 g/dL
Alkaline Phosphatase: 95 units/L
BUN: 13 mg/dL
CO2: 31 meq/L
Calcium: 9.2 mg/dL
Chloride: 102 meq/L
Creatinine, Ser: 1.13 mg/dL
Glucose, Bld: 126 mg/dL
Potassium: 5.5 meq/L
Total Protein: 7.6 g/dL

## 2009-03-27 ENCOUNTER — Encounter (INDEPENDENT_AMBULATORY_CARE_PROVIDER_SITE_OTHER): Payer: Self-pay | Admitting: *Deleted

## 2009-03-27 LAB — CONVERTED CEMR LAB
BUN: 20 mg/dL
Calcium: 9.4 mg/dL
Glomerular Filtration Rate, Af Am: >60 mL/min/{1.73_m2}
Potassium: 4.2 meq/L

## 2009-04-12 ENCOUNTER — Encounter (INDEPENDENT_AMBULATORY_CARE_PROVIDER_SITE_OTHER): Payer: Self-pay | Admitting: *Deleted

## 2009-04-12 ENCOUNTER — Encounter: Payer: Self-pay | Admitting: Family Medicine

## 2009-04-15 ENCOUNTER — Ambulatory Visit: Payer: Self-pay | Admitting: Cardiology

## 2009-04-15 LAB — CONVERTED CEMR LAB: POC INR: 2.3

## 2009-04-20 ENCOUNTER — Ambulatory Visit: Payer: Self-pay | Admitting: Cardiology

## 2009-04-20 ENCOUNTER — Ambulatory Visit (HOSPITAL_COMMUNITY): Admission: RE | Admit: 2009-04-20 | Discharge: 2009-04-20 | Payer: Self-pay | Admitting: Cardiology

## 2009-04-20 LAB — CONVERTED CEMR LAB
BUN: 17 mg/dL (ref 6–23)
Creatinine, Ser: 1.36 mg/dL (ref 0.40–1.50)
Potassium: 3.9 meq/L (ref 3.5–5.3)
Pro B Natriuretic peptide (BNP): 479.8 pg/mL — ABNORMAL HIGH (ref 0.0–100.0)

## 2009-04-23 ENCOUNTER — Encounter (INDEPENDENT_AMBULATORY_CARE_PROVIDER_SITE_OTHER): Payer: Self-pay | Admitting: *Deleted

## 2009-05-13 ENCOUNTER — Ambulatory Visit: Payer: Self-pay | Admitting: Cardiology

## 2009-05-13 LAB — CONVERTED CEMR LAB: POC INR: 2.3

## 2009-06-14 ENCOUNTER — Encounter (INDEPENDENT_AMBULATORY_CARE_PROVIDER_SITE_OTHER): Payer: Self-pay | Admitting: *Deleted

## 2009-06-22 ENCOUNTER — Ambulatory Visit: Payer: Self-pay | Admitting: Family Medicine

## 2009-06-23 LAB — CONVERTED CEMR LAB
Calcium: 9.5 mg/dL (ref 8.4–10.5)
Chloride: 104 meq/L (ref 96–112)
Creatinine, Ser: 0.96 mg/dL (ref 0.40–1.50)
Sodium: 141 meq/L (ref 135–145)

## 2009-06-25 ENCOUNTER — Emergency Department (HOSPITAL_COMMUNITY): Admission: EM | Admit: 2009-06-25 | Discharge: 2009-06-25 | Payer: Self-pay | Admitting: Emergency Medicine

## 2009-07-07 ENCOUNTER — Encounter (INDEPENDENT_AMBULATORY_CARE_PROVIDER_SITE_OTHER): Payer: Self-pay | Admitting: Pharmacist

## 2009-07-29 ENCOUNTER — Encounter (INDEPENDENT_AMBULATORY_CARE_PROVIDER_SITE_OTHER): Payer: Self-pay | Admitting: Pharmacist

## 2009-09-04 ENCOUNTER — Emergency Department (HOSPITAL_COMMUNITY): Admission: EM | Admit: 2009-09-04 | Discharge: 2009-09-04 | Payer: Self-pay | Admitting: Emergency Medicine

## 2009-10-13 ENCOUNTER — Encounter (INDEPENDENT_AMBULATORY_CARE_PROVIDER_SITE_OTHER): Payer: Self-pay | Admitting: Pharmacist

## 2009-10-18 ENCOUNTER — Emergency Department (HOSPITAL_COMMUNITY): Admission: EM | Admit: 2009-10-18 | Discharge: 2009-10-18 | Payer: Self-pay | Admitting: Emergency Medicine

## 2009-10-27 ENCOUNTER — Encounter (INDEPENDENT_AMBULATORY_CARE_PROVIDER_SITE_OTHER): Payer: Self-pay | Admitting: Pharmacist

## 2009-11-24 ENCOUNTER — Encounter (INDEPENDENT_AMBULATORY_CARE_PROVIDER_SITE_OTHER): Payer: Self-pay | Admitting: Pharmacist

## 2009-11-26 ENCOUNTER — Ambulatory Visit: Payer: Self-pay | Admitting: Family Medicine

## 2009-11-26 ENCOUNTER — Encounter (INDEPENDENT_AMBULATORY_CARE_PROVIDER_SITE_OTHER): Payer: Self-pay | Admitting: *Deleted

## 2009-11-26 DIAGNOSIS — M109 Gout, unspecified: Secondary | ICD-10-CM

## 2009-11-26 LAB — CONVERTED CEMR LAB
BUN: 11 mg/dL
Calcium: 9.3 mg/dL
Creatinine, Ser: 1.09 mg/dL

## 2009-11-27 ENCOUNTER — Encounter: Payer: Self-pay | Admitting: Family Medicine

## 2009-11-29 ENCOUNTER — Encounter: Payer: Self-pay | Admitting: Family Medicine

## 2009-11-29 LAB — CONVERTED CEMR LAB
Chloride: 107 meq/L (ref 96–112)
Potassium: 4.7 meq/L (ref 3.5–5.3)
Sodium: 145 meq/L (ref 135–145)
Uric Acid, Serum: 9.9 mg/dL — ABNORMAL HIGH (ref 4.0–7.8)

## 2009-12-05 ENCOUNTER — Emergency Department (HOSPITAL_COMMUNITY): Admission: EM | Admit: 2009-12-05 | Discharge: 2009-12-05 | Payer: Self-pay | Admitting: Emergency Medicine

## 2009-12-18 ENCOUNTER — Inpatient Hospital Stay (HOSPITAL_COMMUNITY): Admission: EM | Admit: 2009-12-18 | Discharge: 2009-12-20 | Payer: Self-pay | Admitting: Emergency Medicine

## 2009-12-18 ENCOUNTER — Encounter (INDEPENDENT_AMBULATORY_CARE_PROVIDER_SITE_OTHER): Payer: Self-pay | Admitting: *Deleted

## 2009-12-18 LAB — CONVERTED CEMR LAB: TSH: 0.429 microintl units/mL

## 2009-12-22 ENCOUNTER — Encounter: Payer: Self-pay | Admitting: Physician Assistant

## 2009-12-22 ENCOUNTER — Ambulatory Visit: Payer: Self-pay | Admitting: Family Medicine

## 2009-12-22 DIAGNOSIS — J449 Chronic obstructive pulmonary disease, unspecified: Secondary | ICD-10-CM

## 2009-12-22 DIAGNOSIS — J4489 Other specified chronic obstructive pulmonary disease: Secondary | ICD-10-CM | POA: Insufficient documentation

## 2009-12-23 LAB — CONVERTED CEMR LAB: Prothrombin Time: 18 s — ABNORMAL HIGH (ref 11.6–15.2)

## 2009-12-27 ENCOUNTER — Ambulatory Visit: Payer: Self-pay | Admitting: Cardiology

## 2009-12-27 LAB — CONVERTED CEMR LAB: POC INR: 2.9

## 2010-01-03 ENCOUNTER — Ambulatory Visit: Payer: Self-pay | Admitting: Family Medicine

## 2010-01-05 ENCOUNTER — Encounter: Payer: Self-pay | Admitting: Family Medicine

## 2010-01-10 ENCOUNTER — Ambulatory Visit: Payer: Self-pay | Admitting: Cardiology

## 2010-01-10 LAB — CONVERTED CEMR LAB: POC INR: 2.9

## 2010-01-24 ENCOUNTER — Ambulatory Visit: Payer: Self-pay | Admitting: Family Medicine

## 2010-01-24 DIAGNOSIS — K4090 Unilateral inguinal hernia, without obstruction or gangrene, not specified as recurrent: Secondary | ICD-10-CM | POA: Insufficient documentation

## 2010-01-26 ENCOUNTER — Ambulatory Visit: Payer: Self-pay | Admitting: Cardiology

## 2010-02-04 ENCOUNTER — Encounter (INDEPENDENT_AMBULATORY_CARE_PROVIDER_SITE_OTHER): Payer: Self-pay | Admitting: *Deleted

## 2010-02-04 ENCOUNTER — Ambulatory Visit: Payer: Self-pay | Admitting: Cardiology

## 2010-02-07 ENCOUNTER — Encounter (INDEPENDENT_AMBULATORY_CARE_PROVIDER_SITE_OTHER): Payer: Self-pay | Admitting: *Deleted

## 2010-02-08 LAB — CONVERTED CEMR LAB
AST: 17 units/L (ref 0–37)
Albumin: 3.9 g/dL (ref 3.5–5.2)
Alkaline Phosphatase: 130 units/L — ABNORMAL HIGH (ref 39–117)
BUN: 13 mg/dL (ref 6–23)
Eosinophils Absolute: 0 10*3/uL (ref 0.0–0.7)
INR: 1.11 (ref <1.50)
Lymphocytes Relative: 13 % (ref 12–46)
Lymphs Abs: 0.6 10*3/uL — ABNORMAL LOW (ref 0.7–4.0)
Neutro Abs: 3.7 10*3/uL (ref 1.7–7.7)
Neutrophils Relative %: 82 % — ABNORMAL HIGH (ref 43–77)
Platelets: 324 10*3/uL (ref 150–400)
Potassium: 5.3 meq/L (ref 3.5–5.3)
Prothrombin Time: 14.5 s (ref 11.6–15.2)
WBC: 4.5 10*3/uL (ref 4.0–10.5)

## 2010-02-18 ENCOUNTER — Ambulatory Visit: Payer: Self-pay | Admitting: Cardiology

## 2010-02-23 ENCOUNTER — Ambulatory Visit: Payer: Self-pay | Admitting: Cardiovascular Disease

## 2010-03-04 ENCOUNTER — Ambulatory Visit: Payer: Self-pay | Admitting: Cardiology

## 2010-03-04 ENCOUNTER — Encounter: Payer: Self-pay | Admitting: Cardiology

## 2010-03-23 ENCOUNTER — Ambulatory Visit: Admission: RE | Admit: 2010-03-23 | Discharge: 2010-03-23 | Payer: Self-pay | Source: Home / Self Care

## 2010-03-23 LAB — CONVERTED CEMR LAB: POC INR: 2.7

## 2010-03-25 ENCOUNTER — Ambulatory Visit: Admit: 2010-03-25 | Payer: Self-pay | Admitting: Family Medicine

## 2010-04-03 ENCOUNTER — Encounter: Payer: Self-pay | Admitting: Cardiology

## 2010-04-12 LAB — CONVERTED CEMR LAB
CO2: 28 meq/L (ref 19–32)
Chloride: 102 meq/L (ref 96–112)
Creatinine, Ser: 0.93 mg/dL (ref 0.40–1.50)
Potassium: 4.3 meq/L (ref 3.5–5.3)
Sodium: 139 meq/L (ref 135–145)

## 2010-04-12 NOTE — Assessment & Plan Note (Signed)
Summary: F UP   Vital Signs:  Patient profile:   70 year old male Height:      66 inches Weight:      163.75 pounds BMI:     26.53 Pulse rate:   84 / minute Pulse rhythm:   regular Resp:     16 per minute BP sitting:   160 / 98  (left arm) Cuff size:   regular  Vitals Entered By: Everitt Amber LPN (November 26, 2009 10:57 AM)  Nutrition Counseling: Patient's BMI is greater than 25 and therefore counseled on weight management options. CC: Follow up chronic problems, wants to get rx for cialis or levitra   Primary Care Provider:  Dr. Syliva Overman  CC:  Follow up chronic problems and wants to get rx for cialis or levitra.  History of Present Illness: Reports  that he has been doing fairly well. He was recently in the Ed for treatment of gout affecting his riught great toe, states he was initilly being treated as infection, but when no better re-evaluation established a dx of gout. He has not taken his BP med today, states he does not take when going to MD as it makes hiom "sleep", suggested taking it at bedtime instead, and stressed the importance oftaking at the same time every day. Denies recent fever or chills. Denies sinus pressure, nasal congestion , ear pain or sore throat. Denies chest congestion, or cough productive of sputum. Denies chest pain, palpitations, PND, orthopnea or leg swelling. Denies abdominal pain, nausea, vomitting, diarrhea or constipation. Denies change in bowel movements or bloody stool. Denies dysuria , frequency, incontinence or hesitancy.c/o erectile dysfunction and wANTS A SCRIPT.The complaint is not new. Denies  joint pain, swelling, or reduced mobility. Denies headaches, vertigo, seizures. Denies depression, anxiety or insomnia. Denies  rash, lesions, or itch.     Preventive Screening-Counseling & Management  Alcohol-Tobacco     Smoking Cessation Counseling: yes  Current Medications (verified): 1)  Coumadin 4 Mg Tabs (Warfarin Sodium)  .... 2 By Mouth On Mon & Thurs, 1 By Mouth All Other Days 2)  Furosemide 40 Mg  Tabs (Furosemide) .Marland Kitchen.. 1 By Mouth Once Daily 3)  Metoprolol Tartrate 25 Mg  Tabs (Metoprolol Tartrate) .Marland Kitchen.. 1 By Mouth Two Times A Day 4)  Diltiazem Hcl Er Beads 360 Mg Cp24 (Diltiazem Hcl Er Beads) .Marland Kitchen.. 1 By Mouth Once Daily 5)  Spiriva Handihaler 18 Mcg  Caps (Tiotropium Bromide Monohydrate) .Marland Kitchen.. 1 Puff Once Daily 6)  Potassium Chloride Crys Cr 20 Meq  Tbcr (Potassium Chloride Crys Cr) .Marland Kitchen.. 1 By Mouth Once Daily 7)  Proair Hfa 108 (90 Base) Mcg/act Aers (Albuterol Sulfate) .... 2 Puffs Q 6 Hours As Needed For Shortness of Breath 8)  Albuterol Sulfate (2.5 Mg/81ml) 0.083% Nebu (Albuterol Sulfate) .... One Vial Per Nebulizer Every Six Hours As Needed 9)  Travatan Z 0.004 % Soln (Travoprost) .... One Drop in Each Eye At Bedtime 10)  Alphagan P 0.1 % Soln (Brimonidine Tartrate) .... One Drop in Both Eyes Two Times A Day  Allergies (verified): No Known Drug Allergies  Review of Systems      See HPI Eyes:  Denies discharge, eye pain, and red eye. GU:  Complains of erectile dysfunction, nocturia, and urinary hesitancy. Endo:  Denies cold intolerance, excessive thirst, excessive urination, and heat intolerance. Heme:  Denies abnormal bruising and bleeding. Allergy:  Denies hives or rash and itching eyes.  Physical Exam  General:  Well-developed,well-nourished,in no acute distress; alert,appropriate  and cooperative throughout examination HEENT: No facial asymmetry,  EOMI, No sinus tenderness, TM's Clear, oropharynx  pink and moist.   Chest: Clear to auscultation bilaterally.  CVS: S1, S2, No murmurs, No S3.   Abd: Soft, Nontender.  MS: Adequate ROM spine, hips, shoulders and knees.  Ext: No edema.   CNS: CN 2-12 intact, power tone and sensation normal throughout.   Skin: Intact, no visible lesions or rashes.  Psych: Good eye contact, normal affect.  Memory intact, not anxious or depressed  appearing.    Impression & Recommendations:  Problem # 1:  GOUT (ICD-274.9) Assessment Improved  Orders: T-Uric Acid (Blood) ([id number])  Problem # 2:  C H F (ICD-428.0) Assessment: Improved  His updated medication list for this problem includes:    Coumadin 4 Mg Tabs (Warfarin sodium) .Marland Kitchen... 2 by mouth on mon & thurs, 1 by mouth all other days    Furosemide 40 Mg Tabs (Furosemide) .Marland Kitchen... 1 by mouth once daily    Metoprolol Tartrate 25 Mg Tabs (Metoprolol tartrate) .Marland Kitchen... 1 by mouth two times a day  Problem # 3:  HYPERTENSION (ICD-401.9) Assessment: Deteriorated  His updated medication list for this problem includes:    Furosemide 40 Mg Tabs (Furosemide) .Marland Kitchen... 1 by mouth once daily    Metoprolol Tartrate 25 Mg Tabs (Metoprolol tartrate) .Marland Kitchen... 1 by mouth two times a day    Diltiazem Hcl Er Beads 360 Mg Cp24 (Diltiazem hcl er beads) .Marland Kitchen... 1 by mouth once daily Check your Blood Pressure regularly. If it is above150/95  you should make an appointment.   Orders: T-Basic Metabolic Panel [id number])  BP today: 160/98 Prior BP: 110/74 (06/22/2009)  Labs Reviewed: K+: 4.0 (06/22/2009) Creat: : 0.96 (06/22/2009)   Chol: 116 (04/21/2008)   HDL: 16 (04/21/2008)   LDL: 84 (04/21/2008)   TG: 78 (04/21/2008)  Problem # 4:  ERECTILE DYSFUNCTION (ICD-302.72) Assessment: Unchanged  The following medications were removed from the medication list:    Cialis 20 Mg Tabs (Tadalafil) .Marland Kitchen... Take 1 tablet by mouth once a day as needed His updated medication list for this problem includes:    Cialis 5 Mg Tabs (Tadalafil) .Marland Kitchen... Take 1 tablet by mouth once a day  Problem # 5:  DYSPNEA (ICD-786.05) Assessment: Improved  Problem # 6:  TOBACCO ABUSE-50 PACK YEARS (ICD-305.1) Assessment: Unchanged  Encouraged smoking cessation and discussed different methods for smoking cessation.   Complete Medication List: 1)  Coumadin 4 Mg Tabs (Warfarin sodium) .... 2 by mouth on mon & thurs, 1 by mouth  all other days 2)  Furosemide 40 Mg Tabs (Furosemide) .Marland Kitchen.. 1 by mouth once daily 3)  Metoprolol Tartrate 25 Mg Tabs (Metoprolol tartrate) .Marland Kitchen.. 1 by mouth two times a day 4)  Diltiazem Hcl Er Beads 360 Mg Cp24 (Diltiazem hcl er beads) .Marland Kitchen.. 1 by mouth once daily 5)  Spiriva Handihaler 18 Mcg Caps (Tiotropium bromide monohydrate) .Marland Kitchen.. 1 puff once daily 6)  Potassium Chloride Crys Cr 20 Meq Tbcr (Potassium chloride crys cr) .Marland Kitchen.. 1 by mouth once daily 7)  Proair Hfa 108 (90 Base) Mcg/act Aers (Albuterol sulfate) .... 2 puffs q 6 hours as needed for shortness of breath 8)  Albuterol Sulfate (2.5 Mg/31ml) 0.083% Nebu (Albuterol sulfate) .... One vial per nebulizer every six hours as needed 9)  Travatan Z 0.004 % Soln (Travoprost) .... One drop in each eye at bedtime 10)  Alphagan P 0.1 % Soln (Brimonidine tartrate) .... One drop in both eyes two times a  day 11)  Cialis 5 Mg Tabs (Tadalafil) .... Take 1 tablet by mouth once a day  Other Orders: Influenza Vaccine NON MCR ([id number])  Patient Instructions: 1)  Please schedule a follow-up appointment in 3.5 months. 2)  Tobacco is very bad for your health and your loved ones! You Should stop smoking!. 3)  Stop Smoking Tips: Choose a Quit date. Cut down before the Quit date. decide what you will do as a substitute when you feel the urge to smoke(gum,toothpick,exercise). 4)  Uric acid and chem7 today Prescriptions: CIALIS 5 MG TABS (TADALAFIL) Take 1 tablet by mouth once a day  #30 x 0   Entered by:   Everitt Amber LPN   Authorized by:   Syliva Overman MD   Signed by:   Syliva Overman MD on 11/27/2009   Method used:   Handwritten   RxID:   [id number] PROAIR HFA 108 (90 BASE) MCG/ACT AERS (ALBUTEROL SULFATE) 2 puffs q 6 hours as needed for shortness of breath  #8.5 Gram x 3   Entered by:   Everitt Amber LPN   Authorized by:   Syliva Overman MD   Signed by:   Everitt Amber LPN on 08/65/7846   Method used:   Electronically to        [facility] [address] [phone]* (retail)       [address] [place], [place]  [address]       Ph: [phone]       Fax: [phone]   RxID:   [id number] FUROSEMIDE 40 MG  TABS (FUROSEMIDE) 1 by mouth once daily  #30 x 3   Entered by:   Everitt Amber LPN   Authorized by:   Syliva Overman MD   Signed by:   Everitt Amber LPN on 18/84/1660   Method used:   Electronically to        [facility] [phone]* (retail)       [address] [place]  [address]       Ph: [phone]       Fax: [phone]   RxID:   [id number] DILTIAZEM HCL ER BEADS 360 MG CP24 (DILTIAZEM HCL ER BEADS) 1 by mouth once daily  #30.0 Each x 3   Entered by:   Everitt Amber LPN   Authorized by:   Syliva Overman MD   Signed by:   Everitt Amber LPN on 62/69/4854   Method used:   Electronically to        [facility] [phone]* (retail)       [address] [place], [place]  [address]       Ph: [phone]       Fax: [phone]   RxID:   [id number]     Influenza Vaccine    Vaccine Type: Fluvax Non-MCR    Site: right deltoid    Mfr: novatis    Dose: 0.5 ml    Route: IM    Given by: Everitt Amber LPN    Exp. Date: 07/2010    Lot #: [id number]

## 2010-04-12 NOTE — Letter (Signed)
Summary: Custom - Delinquent Coumadin 1  [facility]  [address], [place] [address]   Phone: [phone]  Fax: [phone]     July 07, 2009 MRN: [id number]   Theodore [name] [address] [place], [place]  [address]   Dear [name],  This letter is being sent to you as a reminder that it is necessary for you to get your INR/PT checked regularly so that we can optimize your care.  Our records indicate that you were scheduled to have a test done recently.  As of today, we have not received the results of this test.  It is very important that you have your INR checked.  Please call our office at the number listed above to schedule an appointment at your earliest convenience.    If you have recently had your protime checked or have discontinued this medication, please contact our office at the above phone number to clarify this issue.  Thank you for this prompt attention to this important health care matter.  Sincerely, Vashti Hey, RN  [facility] Team

## 2010-04-12 NOTE — Letter (Signed)
Summary: MED REVIEW   MED REVIEW   Imported By: Lind Guest 12/23/2009 14:09:01  _____________________________________________________________________  External Attachment:    Type:   Image     Comment:   External Document

## 2010-04-12 NOTE — Letter (Signed)
Summary: Letter  Letter   Imported By: Lind Guest 11/29/2009 14:04:54  _____________________________________________________________________  External Attachment:    Type:   Image     Comment:   External Document

## 2010-04-12 NOTE — Assessment & Plan Note (Signed)
Summary: NEEDS SURG CLEARANCE PER DR.ZIEGLER FOR HERNIA REPAIR/TG   Visit Type:  Follow-up Primary Provider:  Dr. Syliva Overman   History of Present Illness: [name] returns to the office for continued assessment and treatment of chronic atrial fibrillation, chronic obstructive pulmonary disease and congestive heart failure with preserved LV systolic function.  He was admitted to the hospital approximately 2 months ago for vertigo.  Those records were retrieved and reviewed.  MRI of the brain was negative.  As a result of bradycardia with heart rates in the 40s on telemetry, his rate control medications were readjusted.  He had no symptoms referable to bradycardia nor has he had any particular problems since discharge.  EKG  Procedure date:  02/04/2010  Findings:      Atrial fibrillation with a rapid ventricular response Heart rate is 122 bpm Left anterior fascicular block No previous tracing for comparison.  MRI Brain  Procedure date:  12/20/2009  Findings:      MRI of Brain     IMPRESSION:   No acute intracranial abnormality.  Brain is negative aside from   rare evidence of small vessel ischemia in the posterior   circulation, and probable fetal type PCA origins - anatomic   variant.    Read By:  Augusto Gamble,  M.D.  CXR  Procedure date:  12/19/2009  Findings:      Clinical Data: Atrial fibrillation.  Dizziness.    CHEST - 2 VIEW    Comparison: 12/18/2009    Findings: The cardiopericardial silhouette is enlarged. Pulmonary   arteries remain prominent.  The right hilum now has imaging   features very similar to the 06/25/2009 exam suggesting that the   prominence on the most recent comparison study was simply related   to a slight projectional difference.  There is no edema or focal   pneumonia. Telemetry leads overlie the chest.    IMPRESSION:   Cardiomegaly without acute cardiopulmonary findings.    Read By:  Kennith Center,  M.D.   Current  Medications (verified): 1)  Coumadin 4 Mg Tabs (Warfarin Sodium) .... 2 By Mouth On Mon & Thurs, 1 By Mouth All Other Days 2)  Furosemide 40 Mg  Tabs (Furosemide) .Marland Kitchen.. 1 By Mouth Once Every Other Day 3)  Spiriva Handihaler 18 Mcg  Caps (Tiotropium Bromide Monohydrate) .Marland Kitchen.. 1 Puff Once Daily 4)  Potassium Chloride Crys Cr 20 Meq  Tbcr (Potassium Chloride Crys Cr) .Marland Kitchen.. 1 By Mouth Once Daily 5)  Proair Hfa 108 (90 Base) Mcg/act Aers (Albuterol Sulfate) .... 2 Puffs Q 6 Hours As Needed For Shortness of Breath 6)  Albuterol Sulfate (2.5 Mg/27ml) 0.083% Nebu (Albuterol Sulfate) .... One Vial Per Nebulizer Every Six Hours As Needed 7)  Travatan Z 0.004 % Soln (Travoprost) .... One Drop in Each Eye At Bedtime 8)  Cartia Xt 300 Mg Xr24h-Cap (Diltiazem Hcl Coated Beads) .... Take 1 Tablet By Mouth Once A Day 9)  Warfarin Sodium 4 Mg Tabs (Warfarin Sodium) .... Use As Directed 10)  Metoprolol Succinate 50 Mg Xr24h-Tab (Metoprolol Succinate) .... Take One Tablet By Mouth Daily  Allergies (verified): No Known Drug Allergies  Comments:  Nurse/Medical Assistant: patient brought meds and the only diffrence is he no longer has the allopurinol  Past History:  PMH, FH, and Social History reviewed and updated.  Past Medical History: Atrial fibrillation;  coumadin; asymptomatic bradycardia on telemetry in 12/2009 H/o CHF with preserved LV EF; pulmonary hypertension; normal EF in 03/2009 Hypertension Chronic  obstructive pulmonary disease Anemia: Hemoglobin 11.6 in 04/2008->resolved LE edema Tobacco abuse Erectile dysfunction Glaucoma Allergic rhinitis  Review of Systems       The patient complains of dyspnea on exertion.  The patient denies anorexia, vision loss, decreased hearing, hoarseness, chest pain, syncope, peripheral edema, prolonged cough, headaches, abdominal pain, weight loss, weight gain, and hemoptysis.    Vital Signs:  Patient profile:   70 year old male Weight:      156 pounds O2  Sat:      91 % on Room air Pulse rate:   114 / minute BP sitting:   143 / 85  (right arm)  Vitals Entered By: Dreama Saa, CNA (February 04, 2010 10:53 AM)  O2 Flow:  Room air  Physical Exam  General:  Proportionate weight and height; well-developed, well-nourished, in no acute distress; Weight: 156, 11 pounds less then 9 months ago. Neck-No JVD; no carotid bruits: Lungs-No tachypnea, no rales;expiratory rhonchi; no wheezes; prolonged expiratory phase; increased A-P diameter; rales at the base of the left lateral lung field Cardiovascular-rapid, irregular rhythm; normal PMI; normal S1 and S2; prominent S4 Abdomen-BS normal; soft and non-tender without masses or organomegaly; epigastric bruit Musculoskeletal-No deformities, no cyanosis or clubbing: Neurologic-Normal cranial nerves; symmetric strength and tone:  Skin-Warm, excessive dryness and flaking over the lower extremities Extremities- distal pulses intact; no edema:     Impression & Recommendations:  Problem # 1:  INGUINAL HERNIA, LEFT (ICD-550.90) Although [name] has significant comorbidities including atrial arrhythmias and chronic obstructive pulmonary disease, the proposed surgical procedure entails little physiologic stress, and the risk of perioperative complications is perfectly acceptable.  Beta blocker will be started both for its potential cardioprotective effect and for better control of heart rate, and should be continued throughout the operative interval.  Problem # 2:  COUMADIN THERAPY (ICD-V58.61) Risk of thromboembolism is low during a brief discontinuation of anticoagulant therapy.  Warfarin can be stopped 4 days prior to the procedure and resumed the evening of the procedure.  Problem # 3:  ATRIAL FIBRILLATION (ICD-427.31) Rate in atrial fibrillation is excessive.  There was probably no need to reduce his doses of rate control medicine for asymptomatic bradycardia, particularly if it occurred at night.   Metoprolol will be resumed at a decreased dose of 25 mg q.d., and diltiazem increased to a dose lower than he took previously, 300 mg q.d.  A nursing visit in 2 weeks will assess heart rate control.  Problem # 4:  COPD (ICD-496) Patient reports that cigarette consumption has decreased by a factor of 10 to approximately 2-to 3 cigarettes per day.  I suggested that he discontinue tobacco use entirely.  He has previously stopped with the assistance of nicotine patches for as long as one month.  He will use this nicotine replacement modality once again.  Problem # 5:  HYPERTENSION (ICD-401.9) Blood pressure control is good; current medications will be continued.  A return office visit is anticipated in 4 months.  In the interim, anticoagulation will be managed in our clinic.  Stool for Hemoccult testing and a CBC will be obtained as well as his other preoperative laboratory studies.  Other Orders: T-Comprehensive Metabolic Panel [id number]) T-CBC w/Diff [id number]) T-PTT [id number]) T-Protime, Auto ([id number]) Hemoccult Cards (Take Home) (Hemoccult Cards)  Patient Instructions: 1)  Your physician recommends that you schedule a follow-up appointment in: 4 MONTHS 2)  Your physician recommends that you return for lab work in: TODAY 3)  Your physician has recommended you  make the following change in your medication: INCREASE CARTIA TO 300MG  DAILY WITH NEXT REFILL, START METOPROLOL SUCCINATE 50MG  DAILY 4)  You have been referred to NURSE VISIT IN 2 WEEK WITH A RHYTHM STRIP 5)  Your physician has requested that you regularly monitor and record your blood pressure readings at home.  Please use the same machine at the same time of day to check your readings and record them to bring to your follow-up visit. 6)  Your physician has asked that you test your stool for blood. It is necessary to test 3 different stool specimens for accuracy. You will be given 3 hemoccult cards for specimen collection.  For each stool specimen, place a small portion of stool sample (from 2 different areas of the stool) into the 2 squares on the card. Close card. Repeat with 2 more stool specimens. Bring the cards back to the office for testing. 7)  Your physician discussed the hazards of tobacco use.  Tobacco use cessation is recommended and techniques and options to help you quit were discussed. Prescriptions: METOPROLOL SUCCINATE 50 MG XR24H-TAB (METOPROLOL SUCCINATE) Take one tablet by mouth daily  #30 x 3   Entered by:   Teressa Lower RN   Authorized by:   Kathlen Brunswick, MD, [facility]   Signed by:   Teressa Lower RN on 02/04/2010   Method used:   Electronically to        [facility]. [phone]* (retail)       [address] [place], [place]  [address]       Ph: [phone]       Fax: [phone]   RxID:   [id number] CARTIA XT 300 MG XR24H-CAP (DILTIAZEM HCL COATED BEADS) Take 1 tablet by mouth once a day  #30 x 3   Entered by:   Teressa Lower RN   Authorized by:   Kathlen Brunswick, MD, [facility]   Signed by:   Teressa Lower RN on 02/04/2010   Method used:   Electronically to        [facility]. [phone]* (retail)       [address] [place]  [address]       Ph: [phone]       Fax: [phone]   RxID:   [id number]

## 2010-04-12 NOTE — Medication Information (Signed)
Summary: CCN 4MG  TWO TAB T,T,S,S 1 TAB M,W,F  Anticoagulant Therapy  Managed by: Vashti Hey, RN PCP: Dr. Syliva Overman Supervising MD: Diona Browner MD, Remi Deter Indication 1: Atrial Fibrillation (chronic) Lab Used: [facility] [place] Site: [place] INR POC 2.9  Dietary changes: no    Health status changes: no    Bleeding/hemorrhagic complications: no    Recent/future hospitalizations: yes       Details: In [facility] 12/18/09 - 12/20/09 for vertigo    Any missed doses?: yes     Details: Coumadin has been increased to 8mg  qd except 4mg  on M,W,F  Is patient compliant with meds? no     Details: documented non-compliance   Allergies: No Known Drug Allergies  Anticoagulation Management History:      The patient is taking warfarin and comes in today for a routine follow up visit.  He is being anticoagulated because of chronic atrial fibrillation.  Positive risk factors for bleeding include an age of 18 years or older.  Negative risk factors for bleeding include no history of CVA/TIA, no history of GI bleeding, and absence of serious comorbidities.  The bleeding index is 'intermediate risk'.  Positive CHADS2 values include History of CHF and History of HTN.  Negative CHADS2 values include Age > 22 years old, History of Diabetes, and Prior Stroke/CVA/TIA.  Plans are to continue warfarin for life.  His last INR was 1.47.  Anticoagulation responsible provider: Diona Browner MD, Remi Deter.  INR POC: 2.9.  Cuvette Lot#: [id number].  Exp: 10/11.    Anticoagulation Management Assessment/Plan:      The patient's current anticoagulation dose is Coumadin 4 mg tabs: 2 by mouth on Mon & Thurs, 1 by mouth all other days.  The target INR is 2.0-3.0.  The next INR is due 01/10/2010.  Anticoagulation instructions were given to patient.  Results were reviewed/authorized by Vashti Hey, RN.  He was notified by Vashti Hey RN.        Coagulation management information includes: Cell phone [phone].  Prior  Anticoagulation Instructions: INR 2.3 Continue coumadin 1 tablet once daily except 2 tablets on Tuesdays, Thursdays and Saturdays  Current Anticoagulation Instructions: INR 2.9 Decrease dose to 4mg  once daily except 8mg  on Tuesdays, Thursdays and Saturdays Dose was increase to 8mg  once daily except 4mg  on M,W,F last week and INR jumpef from 1.47 to 2.9

## 2010-04-12 NOTE — Miscellaneous (Signed)
  Clinical Lists Changes  Medications: Changed medication from POTASSIUM CHLORIDE CRYS CR 20 MEQ  TBCR (POTASSIUM CHLORIDE CRYS CR) 1 by mouth once daily to POTASSIUM CHLORIDE CRYS CR 20 MEQ  TBCR (POTASSIUM CHLORIDE CRYS CR) 1/2  by mouth once daily - Signed Orders: Added new Test order of T-Basic Metabolic Panel [id number]) - Signed

## 2010-04-12 NOTE — Assessment & Plan Note (Signed)
Summary: post hosp [facility] per pt phone call/tg   Visit Type:  Follow-up Primary Provider:  Dr. 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  Since resuming daily furosemide, Mr. Brockmann has returned to asymptomatic status.  He is relatively sedentary, but walks outside without difficulty.  He denies chest discomfort, lightheadedness or syncope.  He has had no pedal edema.  A recent surveillance colonoscopy was reportedly negative.  Current Medications (verified): 1)  Coumadin 4 Mg Tabs (Warfarin Sodium) .... 2 By Mouth On Mon & Thurs, 1 By Mouth All Other Days 2)  Furosemide 40 Mg  Tabs (Furosemide) .Marland Kitchen.. 1 By Mouth Once Daily 3)  Metoprolol Tartrate 25 Mg  Tabs (Metoprolol Tartrate) .Marland Kitchen.. 1 By Mouth Two Times A Day 4)  Diltiazem Hcl Er Beads 360 Mg Cp24 (Diltiazem Hcl Er Beads) .Marland Kitchen.. 1 By Mouth Once Daily 5)  Spiriva Handihaler 18 Mcg  Caps (Tiotropium Bromide Monohydrate) .Marland Kitchen.. 1 Puff Once Daily 6)  Potassium Chloride Crys Cr 20 Meq  Tbcr (Potassium Chloride Crys Cr) .Marland Kitchen.. 1 By Mouth Once Daily 7)  Proair Hfa 108 (90 Base) Mcg/act Aers (Albuterol Sulfate) .... 2 Puffs Q 6 Hours As Needed For Shortness of Breath 8)  Albuterol Sulfate (2.5 Mg/44ml) 0.083% Nebu (Albuterol Sulfate) .... One Vial Per Nebulizer Every Six Hours As Needed 9)  Cialis 20 Mg Tabs (Tadalafil) .... Take 1 Tablet By Mouth Once A Day As Needed  Allergies (verified): No Known Drug Allergies  Past History:  PMH, FH, and Social History reviewed and updated.  Past Medical History: Atrial fibrillation;  coumadin History of Congestive heart failure with preserved left ventricular systolic function; moderate        pulmonary hypertension; normal EF in 03/2009 Hypertension Chronic  obstructive pulmonary disease Anemia: Hemoglobin 11.6 in 04/2008->resolved LE edema tobacco abuse erectile dysfunction insomnia cataracts glaucoma Allergic rhinitis  Past Surgical History: Right hand surgical procedure for a tendon repair Bilateral cataract surgery Herniorrhaphy  Review of Systems       see history of present illness  Vital Signs:  Patient profile:   70 year old male Weight:      167 pounds Pulse rate:   50 / minute BP sitting:   127 / 61  (right arm)  Physical Exam  General:    Well developed; no acute distress; proportionate weight and height Neck-No JVD; no carotid bruits: Lungs-No tachypnea,few earlier respiratory rales on the left; no rhonchi; no wheezes; slight prolongation of the expiratory phase Cardiovascular-irregular rhythm; normal PMI; split S1 and S2; minimal systolic ejection murmur Abdomen-BS normal; soft and non-tender without masses or organomegaly:  Musculoskeletal-No deformities, no cyanosis or clubbing: Neurologic-Normal cranial nerves; symmetric strength and tone:  Skin-Warm, no significant lesions: Extremities-Nl distal pulses;trace edema:    Impression & Recommendations:  Problem # 1:  C H F (ICD-428.0) Patient has clear diastolic dysfunction with congestive heart failure despite a normal ejection fraction and normal renal function.  He does well with a relatively low dose of furosemide as long as he takes it.  Continuing compliance was encouraged.  A repeat chemistry profile and BNP level will be obtained.  Problem # 2:  ATRIAL FIBRILLATION (ICD-427.31) Heart rate is well controlled.  Chronic anticoagulation is required and has been well tolerated.  Recent CBC and colonoscopy  were normal.  Stool for Hemoccult testing and blood count will be followed annually.  Warfarin dosage will continue to be adjusted in anticoagulation clinic.  Problem # 3:  HYPERTENSION (ICD-401.9) Blood pressure control is excellent; current medication  will be continued.  I will plan to see this nice gentleman again in 6 months.  Other Orders: T-Basic Metabolic Panel [phone]) T-BNP  (B Natriuretic Peptide) [phone]) T-Chest x-ray, 2 views ([id number])   Patient Instructions: 1)  Your physician recommends that you schedule a follow-up appointment in: 6 MONTHS 2)  Your physician recommends that you return for lab work in: TODAY 3)  A chest x-ray takes a picture of the organs and structures inside the chest, including the heart, lungs, and blood vessels. This test can show several things, including, whether the heart is enlarged; whether fluid is building up in the lungs; and whether pacemaker / defibrillator leads are still in place. TODAY

## 2010-04-12 NOTE — Miscellaneous (Signed)
  Clinical Lists Changes  Medications: Added new medication of ALLOPURINOL 300 MG TABS (ALLOPURINOL) Take 1 tablet by mouth once a day - Signed Rx of ALLOPURINOL 300 MG TABS (ALLOPURINOL) Take 1 tablet by mouth once a day;  #30 x 3;  Signed;  Entered by: Syliva Overman MD;  Authorized by: Syliva Overman MD;  Method used: Electronically to [facility] [address] [phone]*, [address], [place], [place]  [address], Ph: [phone], Fax: [phone]    Prescriptions: ALLOPURINOL 300 MG TABS (ALLOPURINOL) Take 1 tablet by mouth once a day  #30 x 3   Entered and Authorized by:   Syliva Overman MD   Signed by:   Syliva Overman MD on 11/27/2009   Method used:   Electronically to        [facility] [address] [phone]* (retail)       [address] [place]  [address]       Ph: [phone]       Fax: [phone]   RxID:   [id number]

## 2010-04-12 NOTE — Letter (Signed)
Summary: Custom - Delinquent Coumadin 2  [facility]  [address] [place] [address]   Phone: [phone]  Fax: [phone]     October 27, 2009 MRN: [id number]   Theodore [name] [address] [place], [place]  [address]   Dear [name],  We have attempted to contact you by phone and letter on multiple occasions to contact our office for important blood work associated with the blood thinner, warfarin (Coumadin).  Warfarin is a very important drug that can cause life threatening side effects including, bleeding, and thus requires close laboratory monitoring.  We are unable to accept responsibility for blood thinner-related health problems you may develop because you have not followed our recommendations for appropriate monitoring.  These may include abnormal bleeding occurrences and/or development of blood clots (stroke, heart attack, blood clots in legs or lungs, etc.).  We need for you to contact this office at the number listed above to schedule and complete this very important blood work.  Thank you for your assistance in this urgent matter.  Sincerely, Vashti Hey, RN [facility] Team

## 2010-04-12 NOTE — Letter (Signed)
Summary: [name], agricultural at [facility]  [address] [place] [address]   Phone: [phone]  Fax: [phone]      April 23, 2009 MRN: [id number]   Theodore [name] [address] [place], [place]  [address]   Dear Mr. BORQUEZ,  Your test ordered by Selena Batten has been reviewed by your physician (or physician assistant) and was found to be normal or stable. Your physician (or physician assistant) felt no changes were needed at this time.  ____ Echocardiogram  ____ Cardiac Stress Test  __x__ Lab Work  ____ Peripheral vascular study of arms, legs or neck  __x__ CT scan or X-ray  ____ Lung or Breathing test  ____ Other:  No change in medical treatment at this time, per Dr. Dietrich Pates. Enclosed is a copy of your labwork for your records.   Thank you, Reygan Heagle Allyne Gee RN    Vienna Bing, MD, Lenise Arena.C.Gaylord Shih, MD, F.A.C.C Lewayne Bunting, MD, F.A.C.C Nona Dell, MD, F.A.C.C Charlton Haws, MD, Lenise Arena.C.C

## 2010-04-12 NOTE — Progress Notes (Signed)
Summary: [facility]  [facility]   Imported By: Lind Guest 04/16/2009 09:40:27  _____________________________________________________________________  External Attachment:    Type:   Image     Comment:   External Document

## 2010-04-12 NOTE — Assessment & Plan Note (Signed)
Summary: office visit   Vital Signs:  Patient profile:   70 year old male Height:      66 inches Weight:      173.13 pounds BMI:     28.04 O2 Sat:      92 % Pulse rate:   95 / minute Pulse rhythm:   regular Resp:     16 per minute BP sitting:   110 / 74  (left arm) Cuff size:   regular  Vitals Entered By: Everitt Amber LPN (June 22, 2009 11:19 AM)  Nutrition Counseling: Patient's BMI is greater than 25 and therefore counseled on weight management options. CC: he gets short of breath every now and then    Primary Care Provider:  Dr. Syliva Overman  CC:  he gets short of breath every now and then .  History of Present Illness: Reports  thathe has been doing fairly  well. Since his last visit he was hospitalised with acute heart failre, but reports that he is essentially symptom free at this time, only getting short of breath occasionally. Denies recent fever or chills. Denies sinus pressure, nasal congestion , ear pain or sore throat. Denies chest congestion, or cough productive of sputum. Denies chest pain, palpitations, PND, orthopnea or leg swelling. Denies abdominal pain, nausea, vomitting, diarrhea or constipation. Denies change in bowel movements or bloody stool. Denies dysuria , frequency, incontinence or hesitancy. Denies  joint pain, swelling, or reduced mobility. Denies headaches, vertigo, seizures. Denies depression, anxiety or insomnia. Denies  rash, lesions, or itch.     Current Medications (verified): 1)  Coumadin 4 Mg Tabs (Warfarin Sodium) .... 2 By Mouth On Mon & Thurs, 1 By Mouth All Other Days 2)  Furosemide 40 Mg  Tabs (Furosemide) .Marland Kitchen.. 1 By Mouth Once Daily 3)  Metoprolol Tartrate 25 Mg  Tabs (Metoprolol Tartrate) .Marland Kitchen.. 1 By Mouth Two Times A Day 4)  Diltiazem Hcl Er Beads 360 Mg Cp24 (Diltiazem Hcl Er Beads) .Marland Kitchen.. 1 By Mouth Once Daily 5)  Spiriva Handihaler 18 Mcg  Caps (Tiotropium Bromide Monohydrate) .Marland Kitchen.. 1 Puff Once Daily 6)  Potassium Chloride  Crys Cr 20 Meq  Tbcr (Potassium Chloride Crys Cr) .Marland Kitchen.. 1 By Mouth Once Daily 7)  Proair Hfa 108 (90 Base) Mcg/act Aers (Albuterol Sulfate) .... 2 Puffs Q 6 Hours As Needed For Shortness of Breath 8)  Albuterol Sulfate (2.5 Mg/89ml) 0.083% Nebu (Albuterol Sulfate) .... One Vial Per Nebulizer Every Six Hours As Needed 9)  Cialis 20 Mg Tabs (Tadalafil) .... Take 1 Tablet By Mouth Once A Day As Needed 10)  Travatan Z 0.004 % Soln (Travoprost) .... One Drop in Each Eye At Bedtime 11)  Alphagan P 0.1 % Soln (Brimonidine Tartrate) .... One Drop in Both Eyes Two Times A Day  Allergies (verified): No Known Drug Allergies  Review of Systems      See HPI Eyes:  Denies blurring and discharge. Endo:  Denies cold intolerance, excessive hunger, excessive thirst, excessive urination, heat intolerance, polyuria, and weight change. Heme:  Denies abnormal bruising and bleeding. Allergy:  Complains of sneezing.  Physical Exam  General:  Well-developed,well-nourished,in no acute distress; alert,appropriate and cooperative throughout examination HEENT: No facial asymmetry,  EOMI, No sinus tenderness, TM's Clear, oropharynx  pink and moist. no JVD  Chest: decreased air entry, few crackles and wheezes CVS: S1, S2, No murmurs, No S3.   Abd: Soft, Nontender.  MS: Adequate ROM spine, hips, shoulders and knees.  Ext: No edema.   CNS:  CN 2-12 intact, power tone and sensation normal throughout.   Skin: Intact, no visible lesions or rashes.  Psych: Good eye contact, normal affect.  Memory intact, not anxious or depressed appearing.     Impression & Recommendations:  Problem # 1:  C H F (ICD-428.0) Assessment Improved  His updated medication list for this problem includes:    Coumadin 4 Mg Tabs (Warfarin sodium) .Marland Kitchen... 2 by mouth on mon & thurs, 1 by mouth all other days    Furosemide 40 Mg Tabs (Furosemide) .Marland Kitchen... 1 by mouth once daily    Metoprolol Tartrate 25 Mg Tabs (Metoprolol tartrate) .Marland Kitchen... 1 by mouth  two times a day  Orders: T-Basic Metabolic Panel [id number])  Problem # 2:  HYPERTENSION (ICD-401.9) Assessment: Unchanged  His updated medication list for this problem includes:    Furosemide 40 Mg Tabs (Furosemide) .Marland Kitchen... 1 by mouth once daily    Metoprolol Tartrate 25 Mg Tabs (Metoprolol tartrate) .Marland Kitchen... 1 by mouth two times a day    Diltiazem Hcl Er Beads 360 Mg Cp24 (Diltiazem hcl er beads) .Marland Kitchen... 1 by mouth once daily  BP today: 110/74 Prior BP: 127/61 (04/20/2009)  Labs Reviewed: K+: 3.9 (04/20/2009) Creat: : 1.36 (04/20/2009)   Chol: 116 (04/21/2008)   HDL: 16 (04/21/2008)   LDL: 84 (04/21/2008)   TG: 78 (04/21/2008)  Problem # 3:  ALLERGIC RHINITIS (ICD-477.9) Assessment: Improved  Problem # 4:  ATRIAL FIBRILLATION (ICD-427.31) Assessment: Improved  His updated medication list for this problem includes:    Coumadin 4 Mg Tabs (Warfarin sodium) .Marland Kitchen... 2 by mouth on mon & thurs, 1 by mouth all other days    Metoprolol Tartrate 25 Mg Tabs (Metoprolol tartrate) .Marland Kitchen... 1 by mouth two times a day    Diltiazem Hcl Er Beads 360 Mg Cp24 (Diltiazem hcl er beads) .Marland Kitchen... 1 by mouth once daily rate is controlled, and pt has his coumadin followed at the coumadin clinic  Complete Medication List: 1)  Coumadin 4 Mg Tabs (Warfarin sodium) .... 2 by mouth on mon & thurs, 1 by mouth all other days 2)  Furosemide 40 Mg Tabs (Furosemide) .Marland Kitchen.. 1 by mouth once daily 3)  Metoprolol Tartrate 25 Mg Tabs (Metoprolol tartrate) .Marland Kitchen.. 1 by mouth two times a day 4)  Diltiazem Hcl Er Beads 360 Mg Cp24 (Diltiazem hcl er beads) .Marland Kitchen.. 1 by mouth once daily 5)  Spiriva Handihaler 18 Mcg Caps (Tiotropium bromide monohydrate) .Marland Kitchen.. 1 puff once daily 6)  Potassium Chloride Crys Cr 20 Meq Tbcr (Potassium chloride crys cr) .Marland Kitchen.. 1 by mouth once daily 7)  Proair Hfa 108 (90 Base) Mcg/act Aers (Albuterol sulfate) .... 2 puffs q 6 hours as needed for shortness of breath 8)  Albuterol Sulfate (2.5 Mg/50ml) 0.083% Nebu  (Albuterol sulfate) .... One vial per nebulizer every six hours as needed 9)  Cialis 20 Mg Tabs (Tadalafil) .... Take 1 tablet by mouth once a day as needed 10)  Travatan Z 0.004 % Soln (Travoprost) .... One drop in each eye at bedtime 11)  Alphagan P 0.1 % Soln (Brimonidine tartrate) .... One drop in both eyes two times a day  Other Orders: T-PSA ([id number])  Patient Instructions: 1)  Please schedule a follow-up appointment in 3 months. 2)  BMP prior to visit, ICD-9:  stat today 3)  PSA 4)  It is important that you exercise regularly at least 20 minutes 5 times a week. If you develop chest pain, have severe difficulty breathing, or feel very tired ,  stop exercising immediately and seek medical attention. 5)  You need to lose weight. Consider a lower calorie diet and regular exercise.

## 2010-04-12 NOTE — Miscellaneous (Signed)
Summary: LABS 03/25/2009  Clinical Lists Changes  Observations: Added new observation of TSH: 0.543 microintl units/mL (03/25/2009 9:34) Added new observation of T4, FREE: 1.04 ng/dL (29/56/2130 8:65) Added new observation of INR: 4.2  (03/25/2009 9:34) Added new observation of PT PATIENT: 44.3 s (03/25/2009 9:34)

## 2010-04-12 NOTE — Letter (Signed)
Summary: [place] [facility], agricultural at [facility]  [address], [place] [address]   Phone: [phone]  Fax: [phone]     February 07, 2010 MRN: [id number]   Theodore [name] [address] [place], [place]  [address]      YOUR LAB WORK IS DUE   April 11, 2010  Please go to [facility], located across the street from [facility] on the second floor.  Hours are Monday - Friday 7am until 7:30pm         Saturday 8am until 12noon      _X_ YOUR LABWORK IS NOT FASTING --YOU MAY EAT PRIOR TO LABWORK

## 2010-04-12 NOTE — Assessment & Plan Note (Signed)
Summary: NEW PATIENT   Vital Signs:  Patient profile:   70 year old male Height:      66 inches Weight:      170 pounds BMI:     27.54 Pulse rate:   91 / minute Pulse rhythm:   irregularly irregular Resp:     16 per minute BP sitting:   150 / 90  (left arm)  Vitals Entered By: Worthy Keeler LPN (March 24, 2009 10:46 AM)  Nutrition Counseling: Patient's BMI is greater than 25 and therefore counseled on weight management options. CC: new patient Is Patient Diabetic? No Pain Assessment Patient in pain? no        Primary Care Provider:  Dr. Syliva Overman  CC:  new patient.  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 He comes in today with no specificcomplaints or concerns. e has noted his breathing has been a bit more labored, he attributes that to lack of an inhaler and requestsarefill. He is still smoking with no quit date set. He denies depression or anxiety. He denies abdominal pain or change in bowel habits.    Preventive Screening-Counseling & Management  Alcohol-Tobacco     Smoking Cessation Counseling: yes  Current Medications (verified): 1)  Coumadin 4 Mg Tabs (Warfarin Sodium) .... 2 By Mouth On Mon & Thurs, 1 By Mouth All Other Days 2)  Furosemide 40 Mg  Tabs (Furosemide) .Marland Kitchen.. 1 By Mouth Once Daily 3)  Cialis 20 Mg  Tabs (Tadalafil) .Marland Kitchen.. 1 By Mouth As Needed 4)  Metoprolol Tartrate 25 Mg  Tabs (Metoprolol Tartrate) .Marland Kitchen.. 1 By Mouth Two Times A Day 5)  Diltiazem Hcl Er Beads 360 Mg Cp24 (Diltiazem Hcl Er Beads) .Marland Kitchen.. 1 By Mouth Once Daily 6)  Spiriva Handihaler 18 Mcg  Caps (Tiotropium Bromide Monohydrate) .Marland Kitchen.. 1 Puff Once Daily 7)  Potassium Chloride Crys Cr 20 Meq  Tbcr (Potassium Chloride Crys Cr) .Marland Kitchen.. 1 By Mouth Once Daily 8)  Proair Hfa 108 (90 Base) Mcg/act Aers (Albuterol Sulfate) .... 2 Puffs Q 6 Hours As Needed For  Shortness of Breath 9)  Albuterol Sulfate (2.5 Mg/29ml) 0.083% Nebu (Albuterol Sulfate) .... One Vial Per Nebulizer Every Six Hours As Needed  Allergies (verified): No Known Drug Allergies  Past History:  Past Surgical History: SURGERY ON RIGHT HAND FOR TENDONITIS bilateral cataract surgery hernia surgery several years ago  Family History: Reviewed history from 03/03/2008 and no changes required. father-deceased-?CA mother-deceased-?CA sisters x1 brothers x2, 1 brother has CAD children    male-14    male-29  Social History: Married, lives with wife Smoker 3-4 cig. a day Alcohol use-no Drug use-no Retired from [employer] 13 yrs ago, was working part time in the school system up to 6 yrs ago  Review of Systems      See HPI General:  Complains of fatigue; denies chills, fever, and malaise. Eyes:  pt has a h/o  glaucoma. ENT:  Complains of nasal congestion; intermittent clear nasal drainage. CV:  Complains of palpitations and shortness of breath with exertion; denies chest pain or discomfort, lightheadness, near fainting, swelling of feet, and swelling of hands; chronic a fib, . Resp:  Complains of shortness of breath; denies cough and sputum productive. GI:  Denies abdominal pain, constipation, diarrhea, nausea, and vomiting. GU:  Complains of erectile dysfunction; denies dysuria and urinary frequency. MS:  Complains of low back pain and mid back pain; denies joint pain and stiffness; chronic. Derm:  Denies itching and rash. Neuro:  Denies headaches, seizures, sensation of room spinning, and tingling. Psych:  Denies anxiety and depression. Endo:  Denies cold intolerance, excessive hunger, excessive thirst, excessive urination, heat intolerance, polyuria, and weight change. Heme:  Denies abnormal bruising and bleeding. Allergy:  Complains of seasonal allergies.  Physical Exam  General:  Well-developed,well-nourished,in no acute distress; alert,appropriate and  cooperative throughout examination HEENT: No facial asymmetry,  EOMI, No sinus tenderness, TM's Clear, oropharynx  pink and moist.   Chest: decreased air entry, few crackles and wheezes CVS: S1, S2, No murmurs, No S3.   Abd: Soft, Nontender.  MS: Adequate ROM spine, hips, shoulders and knees.  Ext: No edema.   CNS: CN 2-12 intact, power tone and sensation normal throughout.   Skin: Intact, no visible lesions or rashes.  Psych: Good eye contact, normal affect.  Memory intact, not anxious or depressed appearing.     Impression & Recommendations:  Problem # 1:  C H F (ICD-428.0) Assessment Unchanged  His updated medication list for this problem includes:    Coumadin 4 Mg Tabs (Warfarin sodium) .Marland Kitchen... 2 by mouth on mon & thurs, 1 by mouth all other days    Furosemide 40 Mg Tabs (Furosemide) .Marland Kitchen... 1 by mouth once daily    Metoprolol Tartrate 25 Mg Tabs (Metoprolol tartrate) .Marland Kitchen... 1 by mouth two times a day  Problem # 2:  ATRIAL FIBRILLATION (ICD-427.31) Assessment: Unchanged  His updated medication list for this problem includes:    Coumadin 4 Mg Tabs (Warfarin sodium) .Marland Kitchen... 2 by mouth on mon & thurs, 1 by mouth all other days    Metoprolol Tartrate 25 Mg Tabs (Metoprolol tartrate) .Marland Kitchen... 1 by mouth two times a day    Diltiazem Hcl Er Beads 360 Mg Cp24 (Diltiazem hcl er beads) .Marland Kitchen... 1 by mouth once daily  Problem # 3:  TOBACCO ABUSE-50 PACK YEARS (ICD-305.1) Assessment: Unchanged  Encouraged smoking cessation and discussed different methods for smoking cessation.   Problem # 4:  HYPERTENSION (ICD-401.9) Assessment: Unchanged  His updated medication list for this problem includes:    Furosemide 40 Mg Tabs (Furosemide) .Marland Kitchen... 1 by mouth once daily    Metoprolol Tartrate 25 Mg Tabs (Metoprolol tartrate) .Marland Kitchen... 1 by mouth two times a day    Diltiazem Hcl Er Beads 360 Mg Cp24 (Diltiazem hcl er beads) .Marland Kitchen... 1 by mouth once daily  Orders: T-Basic Metabolic Panel [id number])  BP  today: 150/90 Prior BP: 163/94 (01/29/2009)  Labs Reviewed: K+: 4.0 (12/16/2008) Creat: : 0.97 (12/16/2008)   Chol: 116 (04/21/2008)   HDL: 16 (04/21/2008)   LDL: 84 (04/21/2008)   TG: 78 (04/21/2008)  Problem # 5:  ALLERGIC RHINITIS (ICD-477.9) Assessment: Unchanged  Complete Medication List: 1)  Coumadin 4 Mg Tabs (Warfarin sodium) .... 2 by mouth on mon & thurs, 1 by mouth all other days 2)  Furosemide 40 Mg Tabs (Furosemide) .Marland Kitchen.. 1 by mouth once daily 3)  Metoprolol Tartrate 25 Mg Tabs (Metoprolol tartrate) .Marland Kitchen.. 1 by mouth two times a day 4)  Diltiazem Hcl Er Beads 360 Mg Cp24 (Diltiazem hcl er beads) .Marland Kitchen.. 1 by mouth once daily 5)  Spiriva Handihaler 18 Mcg Caps (Tiotropium bromide monohydrate) .Marland Kitchen.. 1 puff once daily 6)  Potassium Chloride Crys Cr 20 Meq Tbcr (Potassium chloride crys cr) .Marland Kitchen.. 1 by mouth once daily 7)  Proair Hfa 108 (90 Base) Mcg/act Aers (  Albuterol sulfate) .... 2 puffs q 6 hours as needed for shortness of breath 8)  Albuterol Sulfate (2.5 Mg/54ml) 0.083% Nebu (Albuterol sulfate) .... One vial per nebulizer every six hours as needed 9)  Cialis 20 Mg Tabs (Tadalafil) .... Take 1 tablet by mouth once a day as needed  Other Orders: T-CBC w/Diff ([id number]) T-PSA (09811-91478) Tdap => 55yrs IM (29562) Admin 1st Vaccine (13086) Gastroenterology Referral (GI)  Patient Instructions: 1)  Please schedule a follow-up appointment in 3 months. 2)  Tobacco is very bad for your health and your loved ones! You Should stop smoking!. 3)  Stop Smoking Tips: Choose a Quit date. Cut down before the Quit date. decide what you will do as a substitute when you feel the urge to smoke(gum,toothpick,exercise). 4)  BMP prior to visit, ICD-9: 5)  CBC w/ Diff prior to visit, ICD-9:  fasting asap 6)  PSA prior to visit, ICD-9: 7)  you will be referred for a screening colonscopy. 8)  pLS ensure that you check your coumadin regularly at the coumadin clinc, this is very impt. 9)  tDap  today. 10)  Check with your pharmacy to see if the shingles vaccine is covered pls. Prescriptions: CIALIS 20 MG TABS (TADALAFIL) Take 1 tablet by mouth once a day as needed  #3 x 3   Entered and Authorized by:   Syliva Overman MD   Signed by:   Syliva Overman MD on 03/24/2009   Method used:   Electronically to        [facility]  [address] (retail)       [address]       [place], [place]  [address]       Ph: [phone]       Fax: [phone]   RxID:   [id number]    Immunizations Administered:  Tetanus Vaccine:    Vaccine Type: Tdap    Site: right deltoid    Mfr: Sanofi Pasteur    Dose: 0.5 ml    Route: IM    Given by: Worthy Keeler LPN    Exp. Date: 04/10/2011    Lot #: [id number]    VIS given: 01/29/07 version given March 24, 2009.

## 2010-04-12 NOTE — Medication Information (Signed)
Summary: ccr-lr  Anticoagulant Therapy  Managed by: Vashti Hey, RN PCP: Dr. Syliva Overman Supervising MD: Dietrich Pates MD, Molly Maduro Indication 1: Atrial Fibrillation (chronic) Lab Used: [facility] [place] Site: [place] INR POC 2.0  Dietary changes: no    Health status changes: no    Bleeding/hemorrhagic complications: no    Recent/future hospitalizations: no    Any changes in medication regimen? no    Recent/future dental: no  Any missed doses?: no       Is patient compliant with meds? yes       Allergies: No Known Drug Allergies  Anticoagulation Management History:      The patient is taking warfarin and comes in today for a routine follow up visit.  Warfarin therapy is being given due to chronic atrial fibrillation.  Positive risk factors for bleeding include an age of 71 years or older.  Negative risk factors for bleeding include no history of CVA/TIA, no history of GI bleeding, and absence of serious comorbidities.  The bleeding index is 'intermediate risk'.  Positive CHADS2 values include History of CHF and History of HTN.  Negative CHADS2 values include Age > 9 years old, History of Diabetes, and Prior Stroke/CVA/TIA.  Plans are to continue warfarin for life.  His last INR was 1.47.  Anticoagulation responsible provider: Dietrich Pates MD, Molly Maduro.  INR POC: 2.0.  Cuvette Lot#: [id number].  Exp: 10/11.    Anticoagulation Management Assessment/Plan:      The patient's current anticoagulation dose is Coumadin 4 mg tabs: 2 by mouth on Mon & Thurs, 1 by mouth all other days, Warfarin sodium 4 mg tabs: Use as directed.  The target INR is 2.0-3.0.  The next INR is due 02/23/2010.  Anticoagulation instructions were given to patient.  Results were reviewed/authorized by Vashti Hey, RN.  He was notified by Vashti Hey RN.         Prior Anticoagulation Instructions: INR 2.9 Continue coumadin 4mg  once daily except 8mg  on Tuesdays, Thursdays and Saturdays Gave pt coumadin 2mg   tablet samples to take for the next 3 days until he gets Rx filled on 01/13/10.  Current Anticoagulation Instructions: INR 2.0 Take extra 1/2 tablet today then resume 1 tablet once daily except 2 tablets on Tuesdays, Thursdays and Saturdays

## 2010-04-12 NOTE — Assessment & Plan Note (Signed)
Summary: certification   Allergies: No Known Drug Allergies   Complete Medication List: 1)  Coumadin 4 Mg Tabs (Warfarin sodium) .... 2 by mouth on mon & thurs, 1 by mouth all other days 2)  Furosemide 40 Mg Tabs (Furosemide) .Marland Kitchen.. 1 by mouth once daily 3)  Cartia Xt 240 Mg Xr24h-cap (Diltiazem hcl coated beads) .... Take 1 tablet by mouth once a day 4)  Spiriva Handihaler 18 Mcg Caps (Tiotropium bromide monohydrate) .Marland Kitchen.. 1 puff once daily 5)  Potassium Chloride Crys Cr 20 Meq Tbcr (Potassium chloride crys cr) .Marland Kitchen.. 1 by mouth once daily 6)  Proair Hfa 108 (90 Base) Mcg/act Aers (Albuterol sulfate) .... 2 puffs q 6 hours as needed for shortness of breath 7)  Albuterol Sulfate (2.5 Mg/51ml) 0.083% Nebu (Albuterol sulfate) .... One vial per nebulizer every six hours as needed 8)  Travatan Z 0.004 % Soln (Travoprost) .... One drop in each eye at bedtime 9)  Allopurinol 300 Mg Tabs (Allopurinol) .... Take 1 tablet by mouth once a day   Orders Added: 1)  New Certification-Home Health [G0180]

## 2010-04-12 NOTE — Medication Information (Signed)
Summary: ccr-lr  Anticoagulant Therapy  Managed by: Theodore Hey, RN PCP: Dr. Syliva Overman Supervising MD: Diona Browner MD, Remi Deter Indication 1: Atrial Fibrillation (chronic) Lab Used: [facility] [place] Site:  INR POC 2.3  Dietary changes: no    Health status changes: no    Bleeding/hemorrhagic complications: no    Recent/future hospitalizations: no    Any changes in medication regimen? no    Recent/future dental: no  Any missed doses?: no       Is patient compliant with meds? yes       Allergies: No Known Drug Allergies  Anticoagulation Management History:      The patient is taking warfarin and comes in today for a routine follow up visit.  Anticoagulation is being administered due to chronic atrial fibrillation.  Positive risk factors for bleeding include an age of 70 years or older.  Negative risk factors for bleeding include no history of CVA/TIA, no history of GI bleeding, and absence of serious comorbidities.  The bleeding index is 'intermediate risk'.  Positive CHADS2 values include History of CHF and History of HTN.  Negative CHADS2 values include Age > 13 years old, History of Diabetes, and Prior Stroke/CVA/TIA.  Plans are to continue warfarin for life.  His last INR was 4.2.  Anticoagulation responsible provider: Diona Browner MD, Remi Deter.  INR POC: 2.3.  Cuvette Lot#: [id number].  Exp: 10/11.    Anticoagulation Management Assessment/Plan:      The patient's current anticoagulation dose is Coumadin 4 mg tabs: 2 by mouth on Mon & Thurs, 1 by mouth all other days.  The target INR is 2.0-3.0.  The next INR is due 06/10/2009.  Anticoagulation instructions were given to patient.  Results were reviewed/authorized by Theodore Hey, RN.  He was notified by Theodore Hey RN.         Prior Anticoagulation Instructions: INR 2.4 Continue coumadin 4mg  once daily except 8mg  on Tuesdays, Thursdays and Saturdays  Current Anticoagulation Instructions: INR 2.3 Continue  coumadin 1 tablet once daily except 2 tablets on Tuesdays, Thursdays and Saturdays

## 2010-04-12 NOTE — Miscellaneous (Signed)
Summary: LABS BMP,11/26/2009,TSH,12/18/2009  Clinical Lists Changes  Observations: Added new observation of TSH: 0.429 microintl units/mL (12/18/2009 8:16) Added new observation of CALCIUM: 9.3 mg/dL (29/51/8841 6:60) Added new observation of CREATININE: 1.09 mg/dL (63/03/6008 9:32) Added new observation of BUN: 11 mg/dL (35/57/3220 2:54) Added new observation of BG RANDOM: 85 mg/dL (27/08/2374 2:83) Added new observation of CO2 PLSM/SER: 29 meq/L (11/26/2009 8:16) Added new observation of CL SERUM: 107 meq/L (11/26/2009 8:16) Added new observation of K SERUM: 4.7 meq/L (11/26/2009 8:16) Added new observation of NA: 145 meq/L (11/26/2009 8:16)

## 2010-04-12 NOTE — Assessment & Plan Note (Signed)
Summary: F UP FROM HOSPITAL   Vital Signs:  Patient profile:   70 year old male Height:      66 inches Weight:      160.25 pounds BMI:     25.96 O2 Sat:      93 % Pulse rate:   89 / minute Resp:     16 per minute BP sitting:   128 / 76  (left arm)  Vitals Entered By: Everitt Amber LPN (December 22, 2009 4:20 PM) CC: Hospital follow up    Primary Provider:  Dr. Syliva Overman  CC:  Hospital follow up .  History of Present Illness: Pt was discharged from hosp 12-20-09.  States he has been feeling well since dishg. Little cough only, which he developed in the hospital.  They prescribed cough medicine for him.   He denies any dizziness, or syncope. No chest pain, palpitations or difficulty breathing. Pt needs his coumadin level checked.  Is taking coumadin as directed in hospital.  States he will not stop taking it again, that he now understands the importance of  it.  Hx of htn. Taking medications as prescribed and denies side effects.  No headache, chest pain or palpitations.  Hx of COPD. Using inhalers as prescribed.     Current Medications (verified): 1)  Coumadin 4 Mg Tabs (Warfarin Sodium) .... 2 By Mouth On Mon & Thurs, 1 By Mouth All Other Days 2)  Furosemide 40 Mg  Tabs (Furosemide) .Marland Kitchen.. 1 By Mouth Once Daily 3)  Cartia Xt 240 Mg Xr24h-Cap (Diltiazem Hcl Coated Beads) .... Take 1 Tablet By Mouth Once A Day 4)  Spiriva Handihaler 18 Mcg  Caps (Tiotropium Bromide Monohydrate) .Marland Kitchen.. 1 Puff Once Daily 5)  Potassium Chloride Crys Cr 20 Meq  Tbcr (Potassium Chloride Crys Cr) .Marland Kitchen.. 1 By Mouth Once Daily 6)  Proair Hfa 108 (90 Base) Mcg/act Aers (Albuterol Sulfate) .... 2 Puffs Q 6 Hours As Needed For Shortness of Breath 7)  Albuterol Sulfate (2.5 Mg/35ml) 0.083% Nebu (Albuterol Sulfate) .... One Vial Per Nebulizer Every Six Hours As Needed 8)  Travatan Z 0.004 % Soln (Travoprost) .... One Drop in Each Eye At Bedtime 9)  Allopurinol 300 Mg Tabs (Allopurinol) .... Take 1 Tablet By  Mouth Once A Day  Allergies (verified): No Known Drug Allergies  Past History:  Past medical history reviewed for relevance to current acute and chronic problems.  Past Medical History: Reviewed history from 04/20/2009 and no changes required. Atrial fibrillation;  coumadin History of Congestive heart failure with preserved left ventricular systolic function; moderate        pulmonary hypertension; normal EF in 03/2009 Hypertension Chronic obstructive pulmonary disease Anemia: Hemoglobin 11.6 in 04/2008->resolved LE edema tobacco abuse erectile dysfunction insomnia cataracts glaucoma Allergic rhinitis  Review of Systems General:  Denies chills and fever. ENT:  Denies earache, nasal congestion, sinus pressure, and sore throat. CV:  Denies chest pain or discomfort and palpitations. Resp:  Complains of cough; denies shortness of breath, sputum productive, and wheezing.  Physical Exam  General:  Well-developed,well-nourished,in no acute distress; alert,appropriate and cooperative throughout examination Head:  Normocephalic and atraumatic without obvious abnormalities. No apparent alopecia or balding. Ears:  External ear exam shows no significant lesions or deformities.  Otoscopic examination reveals clear canals, tympanic membranes are intact bilaterally without bulging, retraction, inflammation or discharge. Hearing is grossly normal bilaterally. Nose:  External nasal examination shows no deformity or inflammation. Nasal mucosa are pink and moist without lesions or exudates.  Mouth:  Oral mucosa and oropharynx without lesions or exudates.   Neck:  No deformities, masses, or tenderness noted. Lungs:  Normal respiratory effort, chest expands symmetrically. Lungs are clear to auscultation, no crackles or wheezes. Heart:  Normal rate and regular rhythm. S1 and S2 normal without gallop, murmur, click, rub or other extra sounds. Cervical Nodes:  No lymphadenopathy noted Psych:   Cognition and judgment appear intact. Alert and cooperative with normal attention span and concentration. No apparent delusions, illusions, hallucinations   Impression & Recommendations:  Problem # 1:  ATRIAL FIBRILLATION (ICD-427.31) Assessment Comment Only  The following medications were removed from the medication list:    Metoprolol Tartrate 25 Mg Tabs (Metoprolol tartrate) .Marland Kitchen... 1 by mouth two times a day His updated medication list for this problem includes:    Coumadin 4 Mg Tabs (Warfarin sodium) .Marland Kitchen... 2 by mouth on mon & thurs, 1 by mouth all other days    Cartia Xt 240 Mg Xr24h-cap (Diltiazem hcl coated beads) .Marland Kitchen... Take 1 tablet by mouth once a day  Orders: INR/PT-[facility] ([address]25) [address] Coumadin Clinic Referral (Coumadin clinic)  Problem # 2:  HYPERTENSION (ICD-401.9) Assessment: Improved  The following medications were removed from the medication list:    Metoprolol Tartrate 25 Mg Tabs (Metoprolol tartrate) .Marland Kitchen... 1 by mouth two times a day His updated medication list for this problem includes:    Furosemide 40 Mg Tabs (Furosemide) .Marland Kitchen... 1 by mouth once daily    Cartia Xt 240 Mg Xr24h-cap (Diltiazem hcl coated beads) .Marland Kitchen... Take 1 tablet by mouth once a day  BP today: 128/76 Prior BP: 160/98 (11/26/2009)  Labs Reviewed: K+: 4.7 (11/26/2009) Creat: : 1.09 (11/26/2009)   Chol: 116 (04/21/2008)   HDL: 16 (04/21/2008)   LDL: 84 (04/21/2008)   TG: 78 (04/21/2008)  Problem # 3:  COPD (ICD-496) Assessment: Comment Only  His updated medication list for this problem includes:    Spiriva Handihaler 18 Mcg Caps (Tiotropium bromide monohydrate) .Marland Kitchen... 1 puff once daily    Proair Hfa 108 (90 Base) Mcg/act Aers (Albuterol sulfate) .Marland Kitchen... 2 puffs q 6 hours as needed for shortness of breath    Albuterol Sulfate (2.5 Mg/97ml) 0.083% Nebu (Albuterol sulfate) ..... One vial per nebulizer every six hours as needed  Complete Medication List: 1)  Coumadin 4 Mg Tabs (Warfarin sodium) .... 2  by mouth on mon & thurs, 1 by mouth all other days 2)  Furosemide 40 Mg Tabs (Furosemide) .Marland Kitchen.. 1 by mouth once daily 3)  Cartia Xt 240 Mg Xr24h-cap (Diltiazem hcl coated beads) .... Take 1 tablet by mouth once a day 4)  Spiriva Handihaler 18 Mcg Caps (Tiotropium bromide monohydrate) .Marland Kitchen.. 1 puff once daily 5)  Potassium Chloride Crys Cr 20 Meq Tbcr (Potassium chloride crys cr) .Marland Kitchen.. 1 by mouth once daily 6)  Proair Hfa 108 (90 Base) Mcg/act Aers (Albuterol sulfate) .... 2 puffs q 6 hours as needed for shortness of breath 7)  Albuterol Sulfate (2.5 Mg/43ml) 0.083% Nebu (Albuterol sulfate) .... One vial per nebulizer every six hours as needed 8)  Travatan Z 0.004 % Soln (Travoprost) .... One drop in each eye at bedtime 9)  Allopurinol 300 Mg Tabs (Allopurinol) .... Take 1 tablet by mouth once a day  Patient Instructions: 1)  Please schedule a follow-up appointment in 1 month. 2)  Have your blood drawn today to check your coumadin level. 3)  You are being referred to the coumadin clinic for future testing and monitoring of  your coumadin level.

## 2010-04-12 NOTE — Medication Information (Signed)
Summary: ccr-lr  Anticoagulant Therapy  Managed by: Vashti Hey, RN PCP: Dr. Syliva Overman Supervising MD: Dietrich Pates MD, Molly Maduro Indication 1: Atrial Fibrillation (chronic) Lab Used: [facility] [place] Site: [place] INR POC 2.3  Dietary changes: no    Health status changes: no    Bleeding/hemorrhagic complications: no    Recent/future hospitalizations: yes       Details: Admitted to [facility] 03/25/09-03/27/09 for CHF  Any changes in medication regimen? no    Recent/future dental: no  Any missed doses?: no       Is patient compliant with meds? yes       Allergies: No Known Drug Allergies  Anticoagulation Management History:      The patient is taking warfarin and comes in today for a routine follow up visit.  Anticoagulation is being administered due to chronic atrial fibrillation.  Positive risk factors for bleeding include an age of 35 years or older.  Negative risk factors for bleeding include no history of CVA/TIA, no history of GI bleeding, and absence of serious comorbidities.  The bleeding index is 'intermediate risk'.  Positive CHADS2 values include History of CHF and History of HTN.  Negative CHADS2 values include Age > 21 years old, History of Diabetes, and Prior Stroke/CVA/TIA.  Plans are to continue warfarin for life.  His last INR was 4.2.  Anticoagulation responsible provider: Dietrich Pates MD, Molly Maduro.  INR POC: 2.3.  Cuvette Lot#: [id number].  Exp: 10/11.    Anticoagulation Management Assessment/Plan:      The patient's current anticoagulation dose is Coumadin 4 mg tabs: 2 by mouth on Mon & Thurs, 1 by mouth all other days.  The target INR is 2.0-3.0.  The next INR is due 05/13/2009.  Anticoagulation instructions were given to patient.  Results were reviewed/authorized by Vashti Hey, RN.  He was notified by Vashti Hey RN.         Prior Anticoagulation Instructions: INR 2.4 Continue coumadin 4mg  once daily except 8mg  on Tuesdays, Thursdays and  Saturdays  Current Anticoagulation Instructions: Same as Prior Instructions.

## 2010-04-12 NOTE — Letter (Signed)
Summary: Custom - Delinquent Coumadin 2  [facility]  [address], [place] [address]   Phone: [phone]  Fax: [phone]     November 24, 2009 MRN: [id number]   SAHITH NURSE [address] [place], [place]  [address]   Dear Mr. STANDAGE,  We have attempted to contact you by phone and letter on multiple occasions to contact our office for important blood work associated with the blood thinner, warfarin (Coumadin).  Warfarin is a very important drug that can cause life threatening side effects including, bleeding, and thus requires close laboratory monitoring.  We are unable to accept responsibility for blood thinner-related health problems you may develop because you have not followed our recommendations for appropriate monitoring.  These may include abnormal bleeding occurrences and/or development of blood clots (stroke, heart attack, blood clots in legs or lungs, etc.).  We need for you to contact this office at the number listed above to schedule and complete this very important blood work.  Thank you for your assistance in this urgent matter.  Sincerely, Vashti Hey RN [facility] Team

## 2010-04-12 NOTE — Miscellaneous (Signed)
Summary: Home Care Report  Home Care Report   Imported By: Lind Guest 01/05/2010 13:38:49  _____________________________________________________________________  External Attachment:    Type:   Image     Comment:   External Document

## 2010-04-12 NOTE — Medication Information (Signed)
Summary: ccr  Anticoagulant Therapy  Managed by: Vashti Hey, RN PCP: Dr. Syliva Overman Supervising MD: Diona Browner MD, Remi Deter Indication 1: Atrial Fibrillation (chronic) Lab Used: [facility] [place] Site: [place] INR POC 2.9  Dietary changes: no    Health status changes: no    Bleeding/hemorrhagic complications: no    Recent/future hospitalizations: no    Any changes in medication regimen? no    Recent/future dental: no  Any missed doses?: yes     Details: Ran out of coumadin yesterday  Is patient compliant with meds? no       Allergies: No Known Drug Allergies  Anticoagulation Management History:      The patient is taking warfarin and comes in today for a routine follow up visit.  He is being anticoagulated because of chronic atrial fibrillation.  Positive risk factors for bleeding include an age of 47 years or older.  Negative risk factors for bleeding include no history of CVA/TIA, no history of GI bleeding, and absence of serious comorbidities.  The bleeding index is 'intermediate risk'.  Positive CHADS2 values include History of CHF and History of HTN.  Negative CHADS2 values include Age > 62 years old, History of Diabetes, and Prior Stroke/CVA/TIA.  Plans are to continue warfarin for life.  His last INR was 1.47.  Anticoagulation responsible provider: Diona Browner MD, Remi Deter.  INR POC: 2.9.  Cuvette Lot#: [id number].  Exp: 10/11.    Anticoagulation Management Assessment/Plan:      The patient's current anticoagulation dose is Coumadin 4 mg tabs: 2 by mouth on Mon & Thurs, 1 by mouth all other days.  The target INR is 2.0-3.0.  The next INR is due 01/26/2010.  Anticoagulation instructions were given to patient.  Results were reviewed/authorized by Vashti Hey, RN.  He was notified by Vashti Hey RN.         Prior Anticoagulation Instructions: INR 2.9 Decrease dose to 4mg  once daily except 8mg  on Tuesdays, Thursdays and Saturdays Dose was increase to 8mg  once  daily except 4mg  on M,W,F last week and INR jumpef from 1.47 to 2.9  Current Anticoagulation Instructions: INR 2.9 Continue coumadin 4mg  once daily except 8mg  on Tuesdays, Thursdays and Saturdays Gave pt coumadin 2mg  tablet samples to take for the next 3 days until he gets Rx filled on 01/13/10.

## 2010-04-12 NOTE — Miscellaneous (Signed)
Summary: HOSP LABS 03/26/09 03/27/2009  Clinical Lists Changes  Observations: Added new observation of CALCIUM: 9.4 mg/dL (96/06/5407 8:11) Added new observation of GFR AA: >60 mL/min/1.10m2 (03/27/2009 9:52) Added new observation of GFR: 50 mL/min (03/27/2009 9:52) Added new observation of CREATININE: 1.40 mg/dL (91/47/8295 6:21) Added new observation of BUN: 20 mg/dL (30/86/5784 6:96) Added new observation of BG RANDOM: 78 mg/dL (29/52/8413 2:44) Added new observation of CO2 PLSM/SER: 35 meq/L (03/27/2009 9:52) Added new observation of CL SERUM: 100 meq/L (03/27/2009 9:52) Added new observation of K SERUM: 4.2 meq/L (03/27/2009 9:52) Added new observation of NA: 140 meq/L (03/27/2009 9:52) Added new observation of CALCIUM: 9.2 mg/dL (03/15/7251 6:64) Added new observation of ALBUMIN: 3.6 g/dL (40/34/7425 9:56) Added new observation of PROTEIN, TOT: 7.6 g/dL (38/75/6433 2:95) Added new observation of SGPT (ALT): 13 units/L (03/26/2009 9:52) Added new observation of SGOT (AST): 23 units/L (03/26/2009 9:52) Added new observation of ALK PHOS: 95 units/L (03/26/2009 9:52) Added new observation of GFR AA: >60 mL/min/1.42m2 (03/26/2009 9:52) Added new observation of GFR: >60 mL/min (03/26/2009 9:52) Added new observation of CREATININE: 1.13 mg/dL (18/84/1660 6:30) Added new observation of BUN: 13 mg/dL (16/03/930 3:55) Added new observation of BG RANDOM: 126 mg/dL (73/22/0254 2:70) Added new observation of CO2 PLSM/SER: 31 meq/L (03/26/2009 9:52) Added new observation of CL SERUM: 102 meq/L (03/26/2009 9:52) Added new observation of K SERUM: 5.5 meq/L (03/26/2009 9:52) Added new observation of NA: 139 meq/L (03/26/2009 9:52)

## 2010-04-12 NOTE — Letter (Signed)
Summary: Appointment - Missed  [facility], Main Office  [address]   [place], [place] [address]   Phone: [phone]  Fax: [phone]     June 14, 2009 MRN: [id number]   SAGAN MASELLI [address] [place], [place]  [address]   Dear Mr. MUSA,  Our records indicate you missed your appointment on    06-10-09    [facility] It is very important that we reach you to reschedule this appointment. We look forward to participating in your health care needs. Please contact us at the number listed above at your earliest convenience to reschedule this appointment.     Sincerely,    Glass blower/designer

## 2010-04-12 NOTE — Consult Note (Signed)
Summary: [facility]   [facility]   Imported By: Roderic Ovens 04/12/2009 10:12:29  _____________________________________________________________________  External Attachment:    Type:   Image     Comment:   External Document

## 2010-04-12 NOTE — Letter (Signed)
Summary: Custom - Delinquent Coumadin 2  [facility]  [address], [place] [address]   Phone: [phone]  Fax: [phone]     Jul 29, 2009 MRN: [id number]   ELVER STADLER [address] [place], [place]  [address]   Dear Mr. SMITHEY,  We have attempted to contact you by phone and letter on multiple occasions to contact our office for important blood work associated with the blood thinner, warfarin (Coumadin).  Warfarin is a very important drug that can cause life threatening side effects including, bleeding, and thus requires close laboratory monitoring.  We are unable to accept responsibility for blood thinner-related health problems you may develop because you have not followed our recommendations for appropriate monitoring.  These may include abnormal bleeding occurrences and/or development of blood clots (stroke, heart attack, blood clots in legs or lungs, etc.).  We need for you to contact this office at the number listed above to schedule and complete this very important blood work.  Thank you for your assistance in this urgent matter.  Sincerely, Vashti Hey RN [facility] Team  Tried to call you to set up appt but you phone was disconnected.  Please call to schedule your appointment.

## 2010-04-12 NOTE — Letter (Signed)
Summary: Custom - Delinquent Coumadin 2  [facility]  [address], [place] [address]   Phone: [phone]  Fax: [phone]     October 13, 2009 MRN: [id number]   Theodore [name] [address] [place], [place]  [address]   Dear [name],  We have attempted to contact you by phone and letter on multiple occasions to contact our office for important blood work associated with the blood thinner, warfarin (Coumadin).  Warfarin is a very important drug that can cause life threatening side effects including, bleeding, and thus requires close laboratory monitoring.  We are unable to accept responsibility for blood thinner-related health problems you may develop because you have not followed our recommendations for appropriate monitoring.  These may include abnormal bleeding occurrences and/or development of blood clots (stroke, heart attack, blood clots in legs or lungs, etc.).  We need for you to contact this office at the number listed above to schedule and complete this very important blood work.  Thank you for your assistance in this urgent matter.  Sincerely, Vashti Hey, RN [facility] Team  PLEASE CALL OUR OFFICE IMMEDIATELY TO DISCUSS THIS ISSUE!

## 2010-04-12 NOTE — Assessment & Plan Note (Signed)
Summary: F UP   Vital Signs:  Patient profile:   70 year old male Height:      66 inches Weight:      156.50 pounds Resp:     16 per minute BP sitting:   150 / 90  (left arm)  Vitals Entered By: Mauricia Area CMA (January 24, 2010 4:48 PM) CC: Pain in abdomen when coughs   Primary Care Provider:  Dr. Syliva Overman  CC:  Pain in abdomen when coughs.  History of Present Illness: LLQ pain and swelling intermittently with cough x 4 week. Reports  that he is doing fairly well. Denies recent fever or chills. Denies sinus pressure, nasal congestion , ear pain or sore throat. Denies chest congestion, or cough productive of sputum. Denies chest pain, palpitations, PND, orthopnea or leg swelling. Denies  nausea, vomitting, diarrhea or constipation.Reports LLQ pain and swelling intermittently over the pasr several months. Denies change in bowel movements or bloody stool. Denies dysuria , frequency, incontinence or hesitancy. Reports  joint pain, swelling, and  reduced mobility. Denies headaches, vertigo, seizures. Denies depression, anxiety or insomnia. Denies  rash, lesions, or itch.     Allergies (verified): No Known Drug Allergies  Review of Systems      See HPI General:  Complains of fatigue and sleep disorder. Eyes:  Denies double vision, eye pain, and red eye. MS:  Complains of joint pain and stiffness. Psych:  Denies anxiety and depression. Endo:  Denies excessive hunger, excessive thirst, and excessive urination. Heme:  Denies abnormal bruising, bleeding, enlarge lymph nodes, and fevers. Allergy:  Denies hives or rash and itching eyes.  Physical Exam  General:  Well-developed,well-nourished,in no acute distress; alert,appropriate and cooperative throughout examination HEENT: No facial asymmetry,  EOMI, No sinus tenderness, TM's Clear, oropharynx  pink and moist.   Chest: decreasedair entry, no crackles or wheezes CVS: S1, S2, No murmurs, No S3.   Abd: Soft,  Nontender. Left inguinal hernia [id number]  ROM spine, hips, shoulders and knees.  Ext: No edema.   CNS: CN 2-12 intact, power tone and sensation normal throughout.   Skin: Intact, no visible lesions or rashes.  Psych: Good eye contact, normal affect.  Memory intact, not anxious or depressed appearing.  Head:  ecreased    Impression & Recommendations:  Problem # 1:  INGUINAL HERNIA, LEFT (ICD-550.90) Assessment Comment Only  Orders: Surgical Referral (Surgery)  Problem # 2:  COPD (ICD-496) Assessment: Unchanged  His updated medication list for this problem includes:    Spiriva Handihaler 18 Mcg Caps (Tiotropium bromide monohydrate) .Marland Kitchen... 1 puff once daily    Proair Hfa 108 (90 Base) Mcg/act Aers (Albuterol sulfate) .Marland Kitchen... 2 puffs q 6 hours as needed for shortness of breath    Albuterol Sulfate (2.5 Mg/35ml) 0.083% Nebu (Albuterol sulfate) ..... One vial per nebulizer every six hours as needed  Orders: Medicare Electronic Prescription [phone])  Problem # 3:  C H F (ICD-428.0) Assessment: Unchanged  His updated medication list for this problem includes:    Coumadin 4 Mg Tabs (Warfarin sodium) .Marland Kitchen... 2 by mouth on mon & thurs, 1 by mouth all other days    Furosemide 40 Mg Tabs (Furosemide) .Marland Kitchen... 1 by mouth once every other day    Warfarin Sodium 4 Mg Tabs (Warfarin sodium) ..... Use as directed  Problem # 4:  INSOMNIA (ICD-780.52) Assessment: Comment Only  Discussed sleep hygiene.   Complete Medication List: 1)  Coumadin 4 Mg Tabs (Warfarin sodium) .... 2 by mouth  on mon & thurs, 1 by mouth all other days 2)  Furosemide 40 Mg Tabs (Furosemide) .Marland Kitchen.. 1 by mouth once every other day 3)  Spiriva Handihaler 18 Mcg Caps (Tiotropium bromide monohydrate) .Marland Kitchen.. 1 puff once daily 4)  Potassium Chloride Crys Cr 20 Meq Tbcr (Potassium chloride crys cr) .Marland Kitchen.. 1 by mouth once daily 5)  Proair Hfa 108 (90 Base) Mcg/act Aers (Albuterol sulfate) .... 2 puffs q 6 hours as needed for shortness  of breath 6)  Albuterol Sulfate (2.5 Mg/35ml) 0.083% Nebu (Albuterol sulfate) .... One vial per nebulizer every six hours as needed 7)  Travatan Z 0.004 % Soln (Travoprost) .... One drop in each eye at bedtime 8)  Allopurinol 300 Mg Tabs (Allopurinol) .... Take 1 tablet by mouth once a day 9)  Cartia Xt 240 Mg Xr24h-cap (Diltiazem hcl coated beads) .... Take 1 tablet by mouth once a day 10)  Warfarin Sodium 4 Mg Tabs (Warfarin sodium) .... Use as directed  Patient Instructions: 1)  Please schedule a follow-up appointment in 4 months. 2)  You will be referred to dr Lovell Sheehan eval of possible left  inguinal hernia. Prescriptions: SPIRIVA HANDIHALER 18 MCG  CAPS (TIOTROPIUM BROMIDE MONOHYDRATE) 1 puff once daily  #30.0 Each x 3   Entered by:   Adella Hare LPN   Authorized by:   Syliva Overman MD   Signed by:   Adella Hare LPN on 30/16/0109   Method used:   Electronically to        [facility] [address] [phone]* (retail)       [address] [place], [place]  [address]       Ph: [phone]       Fax: [phone]   RxID:   [id number] WARFARIN SODIUM 4 MG TABS (WARFARIN SODIUM) Use as directed  #120 x 1   Entered and Authorized by:   Syliva Overman MD   Signed by:   Syliva Overman MD on 01/24/2010   Method used:   Electronically to        [facility] [address] [phone]* (retail)       [address] [place], [place]  [address]       Ph: [phone]       Fax: [phone]   RxID:   [id number] CARTIA XT 240 MG XR24H-CAP (DILTIAZEM HCL COATED BEADS) Take 1 tablet by mouth once a day  #30 x 5   Entered and Authorized by:   Syliva Overman MD   Signed by:   Syliva Overman MD on 01/24/2010   Method used:   Electronically to        [facility] [address] [phone]* (retail)       [address] [place], [place]  [address]       Ph: [phone]       Fax: [phone]   RxID:   [id number]    Orders Added: 1)  Est. Patient Level IV [41937] 2)  Medicare  Electronic Prescription [G8553] 3)  Surgical Referral [Surgery]

## 2010-04-12 NOTE — Medication Information (Signed)
Summary: ccr-lr  Anticoagulant Therapy  Managed by: Vashti Hey, RN PCP: Dr. Syliva Overman Supervising MD: Dietrich Pates MD, Molly Maduro Indication 1: Atrial Fibrillation (chronic) Lab Used: [facility] [place] Site: [place] INR POC 2.4  Dietary changes: no    Health status changes: no    Bleeding/hemorrhagic complications: no    Recent/future hospitalizations: no    Any changes in medication regimen? no    Recent/future dental: no  Any missed doses?: no       Is patient compliant with meds? yes       Allergies: No Known Drug Allergies  Anticoagulation Management History:      The patient is taking warfarin and comes in today for a routine follow up visit.  Anticoagulation is being administered due to chronic atrial fibrillation.  Positive risk factors for bleeding include an age of 71 years or older.  Negative risk factors for bleeding include no history of CVA/TIA, no history of GI bleeding, and absence of serious comorbidities.  The bleeding index is 'intermediate risk'.  Positive CHADS2 values include History of CHF and History of HTN.  Negative CHADS2 values include Age > 71 years old, History of Diabetes, and Prior Stroke/CVA/TIA.  Plans are to continue warfarin for life.  His last INR was 1.96.  Anticoagulation responsible provider: Dietrich Pates MD, Molly Maduro.  INR POC: 2.4.  Cuvette Lot#: [id number].  Exp: 10/11.    Anticoagulation Management Assessment/Plan:      The patient's current anticoagulation dose is Coumadin 4 mg tabs: 2 by mouth on Mon & Thurs, 1 by mouth all other days.  The target INR is 2.0-3.0.  The next INR is due 04/15/2009.  Anticoagulation instructions were given to patient.  Results were reviewed/authorized by Vashti Hey, RN.  He was notified by Vashti Hey RN.         Prior Anticoagulation Instructions: INR 1.9 Increase coumadin to 4mg  once daily except 8mg  on Tuesdays, Thursdays and Saturdays  Current Anticoagulation Instructions: INR 2.4 Continue  coumadin 4mg  once daily except 8mg  on Tuesdays, Thursdays and Saturdays

## 2010-04-14 NOTE — Assessment & Plan Note (Signed)
Summary: nurse visit per lynn hadn't started metoprolol yet/sn  Nurse Visit   Vital Signs:  Patient profile:   70 year old male Weight:      162 pounds O2 Sat:      93 % on Room air Pulse rate:   72 / minute BP sitting:   152 / 85  (left arm)  Vitals Entered ByLarita Fife Via LPN (March 04, 2010 9:47 AM)  O2 Flow:  Room air  Current Medications (verified): 1)  Furosemide 40 Mg  Tabs (Furosemide) .Marland Kitchen.. 1 By Mouth Once Every Other Day 2)  Spiriva Handihaler 18 Mcg  Caps (Tiotropium Bromide Monohydrate) .Marland Kitchen.. 1 Puff Once Daily 3)  Potassium Chloride Crys Cr 20 Meq  Tbcr (Potassium Chloride Crys Cr) .... 1/2  By Mouth Once Daily 4)  Proair Hfa 108 (90 Base) Mcg/act Aers (Albuterol Sulfate) .... 2 Puffs Q 6 Hours As Needed For Shortness of Breath 5)  Albuterol Sulfate (2.5 Mg/75ml) 0.083% Nebu (Albuterol Sulfate) .... One Vial Per Nebulizer Every Six Hours As Needed 6)  Travatan Z 0.004 % Soln (Travoprost) .... One Drop in Each Eye At Bedtime 7)  Cartia Xt 300 Mg Xr24h-Cap (Diltiazem Hcl Coated Beads) .... Take 1 Tablet By Mouth Once A Day 8)  Warfarin Sodium 4 Mg Tabs (Warfarin Sodium) .... Use As Directed 9)  Metoprolol Succinate 50 Mg Xr24h-Tab (Metoprolol Succinate) .... Take One Tablet By Mouth Daily  Allergies (verified): No Known Drug Allergies  Primary Theodore [name]:  Dr. Syliva Overman   History of Present Illness: S: Pt. arrives in office for 1 month BP check and rhythm strip with nurse. B: On last OV with Dr. Dietrich Pates on 02-04-10 pt. was advised to increase Cartia to 300mg  by mouth once daily (on next refill) and start taking Metoprolol Succ. 50mg  by mouth once daily. A: Rhythm strip scanned into chart. Pt. c/o SOB (O2=93%). He did not bring in BP diary but did bring meds. He has not started increased dose of Cartia as he has not yet finished current bottle of 240mg  (has 3 tabs left) Pt. is taking Metoprolol as advised. His BP this morning is 152/85 and pulse is 72. On last OV  BP=143/85 and pulse=114.  R: Pt. advised that we will contact him with Dr. Langston Masker recommendations  03/11/10  Increase diltiazem dose as previously advised.  Theodore [name], M.D.   Pt. states he has started the increased dose of Diltiazem and he has no complaints at this time. Larita Fife Via LPN  March 11, 2010 11:04 AM

## 2010-04-14 NOTE — Medication Information (Signed)
Summary: ccr-lr  Anticoagulant Therapy  Managed by: Theodore Hey, RN PCP: Dr. Syliva Overman Supervising MD: Dietrich Pates MD, Molly Maduro Indication 1: Atrial Fibrillation (chronic) Lab Used: [facility] [place] Site:  INR POC 2.7  Dietary changes: no    Health status changes: no    Bleeding/hemorrhagic complications: no    Recent/future hospitalizations: no    Any changes in medication regimen? no    Recent/future dental: no  Any missed doses?: no       Is patient compliant with meds? yes       Allergies: No Known Drug Allergies  Anticoagulation Management History:      The patient is taking warfarin and comes in today for a routine follow up visit.  Anticoagulation is being administered due to chronic atrial fibrillation.  Positive risk factors for bleeding include an age of 71 years or older.  Negative risk factors for bleeding include no history of CVA/TIA, no history of GI bleeding, and absence of serious comorbidities.  The bleeding index is 'intermediate risk'.  Positive CHADS2 values include History of CHF and History of HTN.  Negative CHADS2 values include Age > 49 years old, History of Diabetes, and Prior Stroke/CVA/TIA.  Plans are to continue warfarin for life.  His last INR was 1.11.  Anticoagulation responsible provider: Dietrich Pates MD, Molly Maduro.  INR POC: 2.7.  Cuvette Lot#: [id number].  Exp: 10/11.    Anticoagulation Management Assessment/Plan:      The patient's current anticoagulation dose is Warfarin sodium 4 mg tabs: Use as directed.  The target INR is 2.0-3.0.  The next INR is due 04/20/2010.  Anticoagulation instructions were given to patient.  Results were reviewed/authorized by Theodore Hey, RN.  He was notified by Theodore Hey RN.         Prior Anticoagulation Instructions: INR 1.8 Take coumadin 1 1/2 tablets tonight then increase dose to 2 tablets once daily except 1 tablet on Mondays, Wednesdays and Fridays  Current Anticoagulation  Instructions: INR 2.7 Continue coumadin 8mg  once daily except 4mg  on Mondays, Wednesdays and Fridays

## 2010-04-14 NOTE — Medication Information (Signed)
Summary: ccr-lr  Anticoagulant Therapy  Managed by: Vashti Hey, RN PCP: Dr. Syliva Overman Supervising MD: Eden Emms MD, Theron Arista Indication 1: Atrial Fibrillation (chronic) Lab Used: [facility] [place] Site: [place] INR POC 1.8  Dietary changes: no    Health status changes: no    Bleeding/hemorrhagic complications: no    Recent/future hospitalizations: no    Any changes in medication regimen? no    Recent/future dental: no  Any missed doses?: no       Is patient compliant with meds? yes       Allergies: No Known Drug Allergies  Anticoagulation Management History:      The patient is taking warfarin and comes in today for a routine follow up visit.  Warfarin therapy is being given due to chronic atrial fibrillation.  Positive risk factors for bleeding include an age of 71 years or older.  Negative risk factors for bleeding include no history of CVA/TIA, no history of GI bleeding, and absence of serious comorbidities.  The bleeding index is 'intermediate risk'.  Positive CHADS2 values include History of CHF and History of HTN.  Negative CHADS2 values include Age > 80 years old, History of Diabetes, and Prior Stroke/CVA/TIA.  Plans are to continue warfarin for life.  His last INR was 1.11.  Anticoagulation responsible provider: Eden Emms MD, Theron Arista.  INR POC: 1.8.  Cuvette Lot#: [id number].  Exp: 10/11.    Anticoagulation Management Assessment/Plan:      The patient's current anticoagulation dose is Coumadin 71 mg tabs: 2 by mouth on Mon & Thurs, 1 by mouth all other days, Warfarin sodium 4 mg tabs: Use as directed.  The target INR is 2.0-3.0.  The next INR is due 03/23/2010.  Anticoagulation instructions were given to patient.  Results were reviewed/authorized by Vashti Hey, RN.  He was notified by Vashti Hey RN.         Prior Anticoagulation Instructions: INR 2.0 Take extra 1/2 tablet today then resume 1 tablet once daily except 2 tablets on Tuesdays, Thursdays and  Saturdays  Current Anticoagulation Instructions: INR 1.8 Take coumadin 1 1/2 tablets tonight then increase dose to 2 tablets once daily except 1 tablet on Mondays, Wednesdays and Fridays

## 2010-04-20 ENCOUNTER — Encounter: Payer: Self-pay | Admitting: Cardiology

## 2010-04-20 ENCOUNTER — Encounter (INDEPENDENT_AMBULATORY_CARE_PROVIDER_SITE_OTHER): Payer: MEDICARE

## 2010-04-20 DIAGNOSIS — Z7901 Long term (current) use of anticoagulants: Secondary | ICD-10-CM

## 2010-04-20 DIAGNOSIS — I4891 Unspecified atrial fibrillation: Secondary | ICD-10-CM

## 2010-04-25 ENCOUNTER — Encounter: Payer: Self-pay | Admitting: Family Medicine

## 2010-04-25 ENCOUNTER — Ambulatory Visit (INDEPENDENT_AMBULATORY_CARE_PROVIDER_SITE_OTHER): Payer: MEDICARE | Admitting: Family Medicine

## 2010-04-25 DIAGNOSIS — Z7901 Long term (current) use of anticoagulants: Secondary | ICD-10-CM

## 2010-04-25 DIAGNOSIS — J449 Chronic obstructive pulmonary disease, unspecified: Secondary | ICD-10-CM

## 2010-04-25 DIAGNOSIS — I10 Essential (primary) hypertension: Secondary | ICD-10-CM

## 2010-04-26 DIAGNOSIS — R972 Elevated prostate specific antigen [PSA]: Secondary | ICD-10-CM | POA: Insufficient documentation

## 2010-04-27 LAB — CONVERTED CEMR LAB: PSA: 6.21 ng/mL — ABNORMAL HIGH (ref <=4.00)

## 2010-04-28 NOTE — Medication Information (Signed)
Summary: ccr-lr [place]  Anticoagulant Therapy Managed by: Vashti Hey, RN Patient Assessment Part 2:  Have you MISSED ANY DOSES or CHANGED TABLETS?  0  Have you had any BRUISING or BLEEDING ( nose or gum bleeds,blood in urine or stool)?  Have you STARTED or STOPPED any MEDICATIONS, including OTC meds,herbals or supplements?  Have you CHANGED your DIET, especially green vegetables,or ALCOHOL intake?  Have you had any ILLNESSES or HOSPITALIZATIONS?  Have you had any signs of CLOTTING?(chest discomfort,dizziness,shortness of breath,arms tingling,slurred speech,swelling or redness in leg)       Regimen Out:    Total Weekly: 44 mg mg  Next INR Due: 05/18/2010      Allergies: No Known Drug Allergies  Anticoagulant Therapy  Managed by: Vashti Hey, RN PCP: Dr. Syliva Overman Supervising MD: Dietrich Pates MD, Molly Maduro Indication 1: Atrial Fibrillation (chronic) Lab Used: [facility] [place] Site: [place] INR POC 2.1  Dietary changes: no    Health status changes: no    Bleeding/hemorrhagic complications: no    Recent/future hospitalizations: no    Any changes in medication regimen? no    Recent/future dental: no  Any missed doses?: no       Is patient compliant with meds? yes         Anticoagulation Management History:      The patient is taking warfarin and comes in today for a routine follow up visit.  Anticoagulation is being administered due to chronic atrial fibrillation.  Positive risk factors for bleeding include an age of 38 years or older.  Negative risk factors for bleeding include no history of CVA/TIA, no history of GI bleeding, and absence of serious comorbidities.  The bleeding index is 'intermediate risk'.  Positive CHADS2 values include History of CHF and History of HTN.  Negative CHADS2 values include Age > 38 years old, History of Diabetes, and Prior Stroke/CVA/TIA.  Plans are to continue warfarin for life.  His last INR was 1.11.   Anticoagulation responsible provider: Dietrich Pates MD, Molly Maduro.  INR POC: 2.1.  Cuvette Lot#: [id number].  Exp: 10/11.    Anticoagulation Management Assessment/Plan:      The patient's current anticoagulation dose is Warfarin sodium 4 mg tabs: Use as directed.  The target INR is 2.0-3.0.  The next INR is due 05/18/2010.  Anticoagulation instructions were given to patient.  Results were reviewed/authorized by Vashti Hey, RN.  He was notified by Vashti Hey RN.         Prior Anticoagulation Instructions: INR 2.7 Continue coumadin 8mg  once daily except 4mg  on Mondays, Wednesdays and Fridays  Current Anticoagulation Instructions: INR 2.1 Continue coumadin 8mg  once daily except 4mg  on Mondays, Wednesdays and Fridays

## 2010-05-10 NOTE — Assessment & Plan Note (Signed)
Summary: follow up 3 mth/slj   Vital Signs:  Patient profile:   70 year old male Height:      66 inches Weight:      162 pounds BMI:     26.24 O2 Sat:      92 % Pulse rate:   101 / minute Pulse rhythm:   regular Resp:     16 per minute BP sitting:   150 / 74  (left arm) Cuff size:   regular  Vitals Entered By: Everitt Amber LPN (April 25, 2010 3:42 PM)  Nutrition Counseling: Patient's BMI is greater than 25 and therefore counseled on weight management options. CC: Follow up chronic problems   Primary Care Provider:  Dr. Syliva Overman  CC:  Follow up chronic problems.  History of Present Illness: Reports  that he is doing fairly well. Denies recent fever or chills. Denies sinus pressure, nasal congestion , ear pain or sore throat. Denies chest congestion, or cough productive of sputum.He has chronic exertional dyspnea due to copd and chf Denies chest pain, palpitations, PND, orthopnea or leg swelling. Denies  nausea, vomitting, diarrhea or constipation.intermittent left lower qud pain Denies change in bowel movements or bloody stool. Denies dysuria , frequency, incontinence or hesitancy.  Denies headaches, vertigo, seizures. Denies depression, anxiety or insomnia. Denies  rash, lesions, or itch.     Preventive Screening-Counseling & Management  Alcohol-Tobacco     Smoking Cessation Counseling: yes  Current Medications (verified): 1)  Furosemide 40 Mg  Tabs (Furosemide) .Marland Kitchen.. 1 By Mouth Once Every Other Day 2)  Spiriva Handihaler 18 Mcg  Caps (Tiotropium Bromide Monohydrate) .Marland Kitchen.. 1 Puff Once Daily 3)  Potassium Chloride Crys Cr 20 Meq  Tbcr (Potassium Chloride Crys Cr) .... 1/2  By Mouth Once Daily 4)  Proair Hfa 108 (90 Base) Mcg/act Aers (Albuterol Sulfate) .... 2 Puffs Q 6 Hours As Needed For Shortness of Breath 5)  Albuterol Sulfate (2.5 Mg/27ml) 0.083% Nebu (Albuterol Sulfate) .... One Vial Per Nebulizer Every Six Hours As Needed 6)  Travatan Z 0.004 % Soln  (Travoprost) .... One Drop in Each Eye At Bedtime 7)  Cartia Xt 300 Mg Xr24h-Cap (Diltiazem Hcl Coated Beads) .... Take 1 Tablet By Mouth Once A Day 8)  Warfarin Sodium 4 Mg Tabs (Warfarin Sodium) .... Use As Directed 9)  Metoprolol Succinate 50 Mg Xr24h-Tab (Metoprolol Succinate) .... Take One Tablet By Mouth Daily  Allergies (verified): No Known Drug Allergies  Review of Systems      See HPI General:  Complains of fatigue. Eyes:  Denies discharge, eye pain, and red eye. CV:  Denies chest pain or discomfort, difficulty breathing while lying down, palpitations, and shortness of breath with exertion. MS:  Complains of joint pain and stiffness. Endo:  Denies cold intolerance, excessive hunger, excessive thirst, and excessive urination. Heme:  Denies abnormal bruising and bleeding. Allergy:  Denies hives or rash and itching eyes.  Physical Exam  General:  Well-developed,well-nourished,in no acute distress; alert,appropriate and cooperative throughout examination HEENT: No facial asymmetry,  EOMI, No sinus tenderness, TM's Clear, oropharynx  pink and moist.   Chest: decreasedair entry, no crackles or wheezes CVS: S1, S2, No murmurs, No S3.   Abd: Soft, Nontender. Left inguinal hernia ZO:[id number]  ROM spine, hips, shoulders and knees.  Ext: No edema.   CNS: CN 2-12 intact, power tone and sensation normal throughout.   Skin: Intact, no visible lesions or rashes.  Psych: Good eye contact, normal affect.  Memory intact, not  anxious or depressed appearing.    Impression & Recommendations:  Problem # 1:  ELEVATED PROSTATE SPECIFIC ANTIGEN (ICD-790.93) Assessment Comment Only  Orders: T-PSA ([id number]) Urology Referral (Urology)  Problem # 2:  INGUINAL HERNIA, LEFT (ICD-550.90) Assessment: Unchanged pt awaiting date foor surgery he has been cleared medically by cardiology  Problem # 3:  COPD (ICD-496) Assessment: Unchanged  His updated medication list for this problem  includes:    Spiriva Handihaler 18 Mcg Caps (Tiotropium bromide monohydrate) .Marland Kitchen... 1 puff once daily    Proair Hfa 108 (90 Base) Mcg/act Aers (Albuterol sulfate) .Marland Kitchen... 2 puffs q 6 hours as needed for shortness of breath    Albuterol Sulfate (2.5 Mg/32ml) 0.083% Nebu (Albuterol sulfate) ..... One vial per nebulizer every six hours as needed  Pulmonary Functions Reviewed: FEV1: 1.41 (01/24/2006)   O2 sat: 92 (04/25/2010)     Vaccines Reviewed: Pneumovax: Historical (01/24/2006)   Flu Vax: Fluvax Non-MCR (11/26/2009)  Problem # 4:  C H F (ICD-428.0) Assessment: Unchanged  His updated medication list for this problem includes:    Furosemide 40 Mg Tabs (Furosemide) .Marland Kitchen... 1 by mouth once every other day    Warfarin Sodium 4 Mg Tabs (Warfarin sodium) ..... Use as directed    Metoprolol Succinate 50 Mg Xr24h-tab (Metoprolol succinate) .Marland Kitchen... Take one tablet by mouth daily  Problem # 5:  HYPERTENSION (ICD-401.9) Assessment: Unchanged  His updated medication list for this problem includes:    Furosemide 40 Mg Tabs (Furosemide) .Marland Kitchen... 1 by mouth once every other day    Cartia Xt 300 Mg Xr24h-cap (Diltiazem hcl coated beads) .Marland Kitchen... Take 1 tablet by mouth once a day    Metoprolol Succinate 50 Mg Xr24h-tab (Metoprolol succinate) .Marland Kitchen... Take one tablet by mouth daily  BP today: 150/74 Prior BP: 152/85 (03/04/2010)  Labs Reviewed: K+: 4.3 (04/12/2010) Creat: : 0.93 (04/12/2010)   Chol: 116 (04/21/2008)   HDL: 16 (04/21/2008)   LDL: 84 (04/21/2008)   TG: 78 (04/21/2008)  Problem # 6:  TOBACCO ABUSE-50 PACK YEARS (ICD-305.1) Assessment: Unchanged  Encouraged smoking cessation and discussed different methods for smoking cessation.   Complete Medication List: 1)  Furosemide 40 Mg Tabs (Furosemide) .Marland Kitchen.. 1 by mouth once every other day 2)  Spiriva Handihaler 18 Mcg Caps (Tiotropium bromide monohydrate) .Marland Kitchen.. 1 puff once daily 3)  Potassium Chloride Crys Cr 20 Meq Tbcr (Potassium chloride crys cr) ....  1/2  by mouth once daily 4)  Proair Hfa 108 (90 Base) Mcg/act Aers (Albuterol sulfate) .... 2 puffs q 6 hours as needed for shortness of breath 5)  Albuterol Sulfate (2.5 Mg/41ml) 0.083% Nebu (Albuterol sulfate) .... One vial per nebulizer every six hours as needed 6)  Travatan Z 0.004 % Soln (Travoprost) .... One drop in each eye at bedtime 7)  Cartia Xt 300 Mg Xr24h-cap (Diltiazem hcl coated beads) .... Take 1 tablet by mouth once a day 8)  Warfarin Sodium 4 Mg Tabs (Warfarin sodium) .... Use as directed 9)  Metoprolol Succinate 50 Mg Xr24h-tab (Metoprolol succinate) .... Take one tablet by mouth daily  Patient Instructions: 1)  Please schedule a follow-up appointment in 4 months. 2)  Tobacco is very bad for your health and your loved ones! You Should stop smoking!. 3)  Stop Smoking Tips: Choose a Quit date. Cut down before the Quit date. decide what you will do as a substitute when you feel the urge to smoke(gum,toothpick,exercise). 4)  PSA prior to visit, ICD-9:   today.Pls call for result in  2 days 5)  You were cleared for surgery by Dr Dietrich Pates since Dec we will send the note to Dr Marliss Coots   Orders Added: 1)  Est. Patient Level IV [98119] 2)  T-PSA [[id number]] 3)  Urology Referral [Urology]

## 2010-05-15 DIAGNOSIS — Z1211 Encounter for screening for malignant neoplasm of colon: Secondary | ICD-10-CM

## 2010-05-15 DIAGNOSIS — Z79899 Other long term (current) drug therapy: Secondary | ICD-10-CM | POA: Insufficient documentation

## 2010-05-15 DIAGNOSIS — Z23 Encounter for immunization: Secondary | ICD-10-CM | POA: Insufficient documentation

## 2010-05-18 ENCOUNTER — Encounter (INDEPENDENT_AMBULATORY_CARE_PROVIDER_SITE_OTHER): Payer: MEDICARE

## 2010-05-18 ENCOUNTER — Encounter: Payer: Self-pay | Admitting: Cardiovascular Disease

## 2010-05-18 DIAGNOSIS — I4891 Unspecified atrial fibrillation: Secondary | ICD-10-CM

## 2010-05-18 DIAGNOSIS — Z7901 Long term (current) use of anticoagulants: Secondary | ICD-10-CM

## 2010-05-18 LAB — CONVERTED CEMR LAB: POC INR: 2

## 2010-05-24 NOTE — Medication Information (Signed)
Summary: ccr-lr  Anticoagulant Therapy  Managed by: Vashti Hey, RN PCP: Dr. Syliva Overman Supervising MD: Eden Emms MD, Theron Arista Indication 1: Atrial Fibrillation (chronic) Lab Used: [facility]  Site: [place] INR POC 2.0  Dietary changes: no    Health status changes: no    Bleeding/hemorrhagic complications: no    Recent/future hospitalizations: no    Any changes in medication regimen? no    Recent/future dental: no  Any missed doses?: no       Is patient compliant with meds? yes       Allergies: No Known Drug Allergies  Anticoagulation Management History:      The patient is taking warfarin and comes in today for a routine follow up visit.  Anticoagulation is being administered due to chronic atrial fibrillation.  Positive risk factors for bleeding include an age of 4 years or older.  Negative risk factors for bleeding include no history of CVA/TIA, no history of GI bleeding, and absence of serious comorbidities.  The bleeding index is 'intermediate risk'.  Positive CHADS2 values include History of CHF and History of HTN.  Negative CHADS2 values include Age > 40 years old, History of Diabetes, and Prior Stroke/CVA/TIA.  Plans are to continue warfarin for life.  His last INR was 1.11.  Anticoagulation responsible provider: Eden Emms MD, Theron Arista.  INR POC: 2.0.  Cuvette Lot#: [id number].  Exp: 10/11.    Anticoagulation Management Assessment/Plan:      The patient's current anticoagulation dose is Warfarin sodium 4 mg tabs: Use as directed.  The target INR is 2.0-3.0.  The next INR is due 06/15/2010.  Anticoagulation instructions were given to patient.  Results were reviewed/authorized by Vashti Hey, RN.  He was notified by Vashti Hey RN.         Prior Anticoagulation Instructions: INR 2.1 Continue coumadin 8mg  once daily except 4mg  on Mondays, Wednesdays and Fridays  Current Anticoagulation Instructions: INR 2.0 Take coumadin 1 extra tablet today then resume 2  tablets once daily except 1 tablet on Mondays, Wednesdays and Fridays

## 2010-05-25 LAB — DIFFERENTIAL
Basophils Absolute: 0 10*3/uL (ref 0.0–0.1)
Basophils Relative: 0 % (ref 0–1)
Lymphocytes Relative: 10 % — ABNORMAL LOW (ref 12–46)
Lymphocytes Relative: 17 % (ref 12–46)
Lymphs Abs: 0.6 10*3/uL — ABNORMAL LOW (ref 0.7–4.0)
Lymphs Abs: 1.2 10*3/uL (ref 0.7–4.0)
Lymphs Abs: 1.4 10*3/uL (ref 0.7–4.0)
Monocytes Absolute: 0.7 10*3/uL (ref 0.1–1.0)
Monocytes Absolute: 0.8 10*3/uL (ref 0.1–1.0)
Monocytes Absolute: 1.3 10*3/uL — ABNORMAL HIGH (ref 0.1–1.0)
Monocytes Relative: 11 % (ref 3–12)
Monocytes Relative: 18 % — ABNORMAL HIGH (ref 3–12)
Neutro Abs: 4.5 10*3/uL (ref 1.7–7.7)
Neutro Abs: 4.6 10*3/uL (ref 1.7–7.7)
Neutro Abs: 5.4 10*3/uL (ref 1.7–7.7)

## 2010-05-25 LAB — CBC
HCT: 33 % — ABNORMAL LOW (ref 39.0–52.0)
HCT: 36.5 % — ABNORMAL LOW (ref 39.0–52.0)
HCT: 37.5 % — ABNORMAL LOW (ref 39.0–52.0)
Hemoglobin: 10.6 g/dL — ABNORMAL LOW (ref 13.0–17.0)
Hemoglobin: 11.6 g/dL — ABNORMAL LOW (ref 13.0–17.0)
MCHC: 31.9 g/dL (ref 30.0–36.0)
RBC: 3.64 MIL/uL — ABNORMAL LOW (ref 4.22–5.81)
RBC: 4.13 MIL/uL — ABNORMAL LOW (ref 4.22–5.81)
RDW: 13.8 % (ref 11.5–15.5)
WBC: 6.2 10*3/uL (ref 4.0–10.5)
WBC: 7.4 10*3/uL (ref 4.0–10.5)

## 2010-05-25 LAB — LIPID PANEL
Cholesterol: 123 mg/dL (ref 0–200)
LDL Cholesterol: 71 mg/dL (ref 0–99)
VLDL: 14 mg/dL (ref 0–40)

## 2010-05-25 LAB — COMPREHENSIVE METABOLIC PANEL
Albumin: 3.5 g/dL (ref 3.5–5.2)
Alkaline Phosphatase: 91 U/L (ref 39–117)
BUN: 13 mg/dL (ref 6–23)
Chloride: 96 mEq/L (ref 96–112)
Glucose, Bld: 144 mg/dL — ABNORMAL HIGH (ref 70–99)
Potassium: 4.2 mEq/L (ref 3.5–5.1)
Total Bilirubin: 0.5 mg/dL (ref 0.3–1.2)

## 2010-05-25 LAB — GLUCOSE, CAPILLARY
Glucose-Capillary: 117 mg/dL — ABNORMAL HIGH (ref 70–99)
Glucose-Capillary: 144 mg/dL — ABNORMAL HIGH (ref 70–99)

## 2010-05-25 LAB — URINALYSIS, ROUTINE W REFLEX MICROSCOPIC
Glucose, UA: NEGATIVE mg/dL
Hgb urine dipstick: NEGATIVE
Ketones, ur: NEGATIVE mg/dL
Protein, ur: NEGATIVE mg/dL

## 2010-05-25 LAB — CARDIAC PANEL(CRET KIN+CKTOT+MB+TROPI)
CK, MB: 4.5 ng/mL — ABNORMAL HIGH (ref 0.3–4.0)
Relative Index: INVALID (ref 0.0–2.5)
Total CK: 86 U/L (ref 7–232)

## 2010-05-25 LAB — POCT CARDIAC MARKERS: Myoglobin, poc: 85.3 ng/mL (ref 12–200)

## 2010-05-25 LAB — BASIC METABOLIC PANEL
BUN: 10 mg/dL (ref 6–23)
GFR calc Af Amer: >60 mL/min (ref >60)
GFR calc non Af Amer: >60 mL/min (ref >60)
Potassium: 4 mEq/L (ref 3.5–5.1)
Sodium: 140 mEq/L (ref 135–145)

## 2010-05-25 LAB — PROTIME-INR
INR: 1.18 (ref 0.00–1.49)
Prothrombin Time: 14.5 seconds (ref 11.6–15.2)

## 2010-05-29 LAB — DIFFERENTIAL
Basophils Absolute: 0 10*3/uL (ref 0.0–0.1)
Basophils Absolute: 0 10*3/uL (ref 0.0–0.1)
Eosinophils Absolute: 0 10*3/uL (ref 0.0–0.7)
Eosinophils Relative: 1 % (ref 0–5)
Lymphocytes Relative: 15 % (ref 12–46)
Lymphocytes Relative: 16 % (ref 12–46)
Monocytes Absolute: 0.2 10*3/uL (ref 0.1–1.0)
Monocytes Absolute: 0.7 10*3/uL (ref 0.1–1.0)
Monocytes Relative: 6 % (ref 3–12)
Neutro Abs: 2.7 10*3/uL (ref 1.7–7.7)

## 2010-05-29 LAB — CARDIAC PANEL(CRET KIN+CKTOT+MB+TROPI)
Relative Index: 3.3 — ABNORMAL HIGH (ref 0.0–2.5)
Relative Index: 3.6 — ABNORMAL HIGH (ref 0.0–2.5)
Total CK: 223 U/L (ref 7–232)
Troponin I: 0.03 ng/mL (ref 0.00–0.06)
Troponin I: 0.04 ng/mL (ref 0.00–0.06)
Troponin I: 0.04 ng/mL (ref 0.00–0.06)

## 2010-05-29 LAB — BASIC METABOLIC PANEL
BUN: 20 mg/dL (ref 6–23)
CO2: 30 mEq/L (ref 19–32)
CO2: 35 mEq/L — ABNORMAL HIGH (ref 19–32)
Chloride: 101 mEq/L (ref 96–112)
GFR calc non Af Amer: 50 mL/min — ABNORMAL LOW (ref >60)
GFR calc non Af Amer: >60 mL/min (ref >60)
Glucose, Bld: 114 mg/dL — ABNORMAL HIGH (ref 70–99)
Glucose, Bld: 78 mg/dL (ref 70–99)
Potassium: 4.2 mEq/L (ref 3.5–5.1)
Potassium: 4.8 mEq/L (ref 3.5–5.1)
Sodium: 141 mEq/L (ref 135–145)

## 2010-05-29 LAB — CBC
HCT: 40.8 % (ref 39.0–52.0)
HCT: 40.9 % (ref 39.0–52.0)
Hemoglobin: 12.9 g/dL — ABNORMAL LOW (ref 13.0–17.0)
MCHC: 31.9 g/dL (ref 30.0–36.0)
MCV: 91.1 fL (ref 78.0–100.0)
MCV: 91.3 fL (ref 78.0–100.0)
Platelets: 167 10*3/uL (ref 150–400)
RDW: 14.4 % (ref 11.5–15.5)
RDW: 14.6 % (ref 11.5–15.5)

## 2010-05-29 LAB — BRAIN NATRIURETIC PEPTIDE
Pro B Natriuretic peptide (BNP): 1380 pg/mL — ABNORMAL HIGH (ref 0.0–100.0)
Pro B Natriuretic peptide (BNP): 1590 pg/mL — ABNORMAL HIGH (ref 0.0–100.0)

## 2010-05-29 LAB — COMPREHENSIVE METABOLIC PANEL
Albumin: 3.6 g/dL (ref 3.5–5.2)
BUN: 13 mg/dL (ref 6–23)
Creatinine, Ser: 1.13 mg/dL (ref 0.4–1.5)
Total Bilirubin: 1 mg/dL (ref 0.3–1.2)
Total Protein: 7.6 g/dL (ref 6.0–8.3)

## 2010-05-29 LAB — POCT CARDIAC MARKERS
CKMB, poc: 5.6 ng/mL (ref 1.0–8.0)
Myoglobin, poc: 200 ng/mL (ref 12–200)

## 2010-05-29 LAB — LIPID PANEL
Cholesterol: 141 mg/dL (ref 0–200)
LDL Cholesterol: 89 mg/dL (ref 0–99)

## 2010-05-31 LAB — CBC
HCT: 36 % — ABNORMAL LOW (ref 39.0–52.0)
Hemoglobin: 12.1 g/dL — ABNORMAL LOW (ref 13.0–17.0)
MCV: 90.9 fL (ref 78.0–100.0)
RBC: 3.97 MIL/uL — ABNORMAL LOW (ref 4.22–5.81)
WBC: 6 10*3/uL (ref 4.0–10.5)

## 2010-05-31 LAB — BLOOD GAS, ARTERIAL
FIO2: 21 %
Patient temperature: 37
TCO2: 25.7 mmol/L (ref 0–100)
pH, Arterial: 7.398 (ref 7.350–7.450)

## 2010-05-31 LAB — BASIC METABOLIC PANEL
Chloride: 102 mEq/L (ref 96–112)
GFR calc Af Amer: >60 mL/min (ref >60)
Potassium: 4 mEq/L (ref 3.5–5.1)
Sodium: 139 mEq/L (ref 135–145)

## 2010-05-31 LAB — DIFFERENTIAL
Eosinophils Absolute: 0.1 10*3/uL (ref 0.0–0.7)
Eosinophils Relative: 1 % (ref 0–5)
Lymphs Abs: 0.7 10*3/uL (ref 0.7–4.0)
Monocytes Relative: 21 % — ABNORMAL HIGH (ref 3–12)

## 2010-05-31 LAB — BRAIN NATRIURETIC PEPTIDE: Pro B Natriuretic peptide (BNP): 542 pg/mL — ABNORMAL HIGH (ref 0.0–100.0)

## 2010-06-02 ENCOUNTER — Encounter: Payer: Self-pay | Admitting: Adult Health

## 2010-06-02 ENCOUNTER — Ambulatory Visit: Payer: Self-pay | Admitting: Cardiology

## 2010-06-02 ENCOUNTER — Ambulatory Visit (INDEPENDENT_AMBULATORY_CARE_PROVIDER_SITE_OTHER): Payer: MEDICARE | Admitting: Adult Health

## 2010-06-02 ENCOUNTER — Encounter: Payer: Self-pay | Admitting: Cardiology

## 2010-06-02 DIAGNOSIS — I10 Essential (primary) hypertension: Secondary | ICD-10-CM

## 2010-06-02 DIAGNOSIS — I4891 Unspecified atrial fibrillation: Secondary | ICD-10-CM

## 2010-06-02 DIAGNOSIS — I509 Heart failure, unspecified: Secondary | ICD-10-CM

## 2010-06-02 MED ORDER — FUROSEMIDE 40 MG PO TABS: 40.0000 mg | ORAL_TABLET | ORAL | Status: DC

## 2010-06-02 NOTE — Progress Notes (Signed)
Theodore [name] is a pleasant 70 y/o AAM we are following for ongoing assessment and treatment of Atrial fib (on coumadin and followed in our coumadin clinic), hypertension, with history of CHF.  He is without complaint today. He has been medically compliant but has not taken his lasix today because of appointments.  He smokes on cigarette a week.  He is watching salt intake.  He denies symptoms.  Review of systems complete and found to be negative unless listed above  General: Well developed, well nourished, in no acute distress Head: Eyes PERRLA, No xanthomas.   Normal cephalic and atramatic  Lungs: Mild bibasilar crackles without wheezes.Marland Kitchen Heart: IRRR S1 S2, with soft S4 murmur.  Pulses are 2+ & equal.            No carotid bruit. No JVD.  No abdominal bruits. No femoral bruits. Abdomen: Bowel sounds are positive, abdomen soft and non-tender without masses or                  Hernia's noted. Msk:  Back normal, normal gait. Normal strength and tone for age. Extremities: No clubbing, cyanosis or edema.  DP +1 Neuro: Alert and oriented X 3. Psych:  Good affect, responds appropriately

## 2010-06-02 NOTE — Patient Instructions (Signed)
Your physician wants you to follow-up in: 6 months  You will receive a reminder letter in the mail two months in advance. If you don't receive a letter, please call our office to schedule the follow-up appointment.  Your physician recommends that you continue on your current medications as directed. Please refer to the Current Medication list given to you today.  

## 2010-06-02 NOTE — Assessment & Plan Note (Addendum)
Theodore [name] is doing well concerning his rate control.  He is medically compliant and has brought all of his medications.  He did not take the lasix yet today because of appointments but will take it when he goes home.  He is asymptomatic, denying palpitations or increased shortness of breath. He has not been scheduled for ventral hernia repair.  He was cleared by cardiology to have this completed back in November of 2011.

## 2010-06-02 NOTE — Assessment & Plan Note (Signed)
Currently moderately controlled. Suspect some of this is from not taking lasix.

## 2010-06-02 NOTE — Assessment & Plan Note (Signed)
No evidence of fluid overload.  Mild crackles in the bases.

## 2010-06-08 ENCOUNTER — Encounter: Payer: Self-pay | Admitting: Cardiology

## 2010-06-08 DIAGNOSIS — I4891 Unspecified atrial fibrillation: Secondary | ICD-10-CM

## 2010-06-08 DIAGNOSIS — Z7901 Long term (current) use of anticoagulants: Secondary | ICD-10-CM | POA: Insufficient documentation

## 2010-06-15 ENCOUNTER — Ambulatory Visit (INDEPENDENT_AMBULATORY_CARE_PROVIDER_SITE_OTHER): Payer: MEDICARE | Admitting: *Deleted

## 2010-06-15 ENCOUNTER — Telehealth: Payer: Self-pay | Admitting: Cardiology

## 2010-06-15 DIAGNOSIS — I4891 Unspecified atrial fibrillation: Secondary | ICD-10-CM

## 2010-06-15 DIAGNOSIS — Z7901 Long term (current) use of anticoagulants: Secondary | ICD-10-CM

## 2010-06-15 NOTE — Telephone Encounter (Signed)
Per dr ziglars office patient was seen 06/02/10 by [place] and they never received a fax or notification of surgical clearance request, she states that they sent it over before patient appointment. He is having hernia repair.

## 2010-06-16 LAB — BASIC METABOLIC PANEL
BUN: 11 mg/dL (ref 6–23)
Creatinine, Ser: 0.97 mg/dL (ref 0.4–1.5)
GFR calc non Af Amer: >60 mL/min (ref >60)
Glucose, Bld: 115 mg/dL — ABNORMAL HIGH (ref 70–99)
Potassium: 4 mEq/L (ref 3.5–5.1)

## 2010-06-16 LAB — DIFFERENTIAL
Basophils Absolute: 0 10*3/uL (ref 0.0–0.1)
Eosinophils Absolute: 0.1 10*3/uL (ref 0.0–0.7)
Eosinophils Relative: 2 % (ref 0–5)
Lymphocytes Relative: 11 % — ABNORMAL LOW (ref 12–46)

## 2010-06-16 LAB — CBC
HCT: 37.3 % — ABNORMAL LOW (ref 39.0–52.0)
MCV: 90.9 fL (ref 78.0–100.0)
Platelets: 164 10*3/uL (ref 150–400)
RDW: 14.2 % (ref 11.5–15.5)

## 2010-06-16 LAB — PROTIME-INR
INR: 1.76 — ABNORMAL HIGH (ref 0.00–1.49)
Prothrombin Time: 20.4 seconds — ABNORMAL HIGH (ref 11.6–15.2)
Prothrombin Time: 22.2 seconds — ABNORMAL HIGH (ref 11.6–15.2)

## 2010-06-16 NOTE — Telephone Encounter (Signed)
Sent copy of office note with clearance to Dr. Aris Lot office

## 2010-06-23 ENCOUNTER — Other Ambulatory Visit: Payer: Self-pay | Admitting: Family Medicine

## 2010-06-23 DIAGNOSIS — R600 Localized edema: Secondary | ICD-10-CM

## 2010-06-23 DIAGNOSIS — J449 Chronic obstructive pulmonary disease, unspecified: Secondary | ICD-10-CM

## 2010-06-27 LAB — DIFFERENTIAL
Basophils Absolute: 0.1 10*3/uL (ref 0.0–0.1)
Lymphocytes Relative: 11 % — ABNORMAL LOW (ref 12–46)
Monocytes Absolute: 0.8 10*3/uL (ref 0.1–1.0)
Monocytes Relative: 10 % (ref 3–12)
Neutro Abs: 6.4 10*3/uL (ref 1.7–7.7)

## 2010-06-27 LAB — CBC
Hemoglobin: 13.1 g/dL (ref 13.0–17.0)
RBC: 4.62 MIL/uL (ref 4.22–5.81)
WBC: 8.2 10*3/uL (ref 4.0–10.5)

## 2010-06-28 LAB — URINE CULTURE: Culture: NO GROWTH

## 2010-06-28 LAB — BASIC METABOLIC PANEL
BUN: 15 mg/dL (ref 6–23)
BUN: 20 mg/dL (ref 6–23)
BUN: 28 mg/dL — ABNORMAL HIGH (ref 6–23)
BUN: 29 mg/dL — ABNORMAL HIGH (ref 6–23)
CO2: 30 mEq/L (ref 19–32)
CO2: 34 mEq/L — ABNORMAL HIGH (ref 19–32)
Calcium: 8.9 mg/dL (ref 8.4–10.5)
Calcium: 9.2 mg/dL (ref 8.4–10.5)
Calcium: 9.2 mg/dL (ref 8.4–10.5)
Calcium: 9.4 mg/dL (ref 8.4–10.5)
Calcium: 9.5 mg/dL (ref 8.4–10.5)
Chloride: 103 mEq/L (ref 96–112)
Creatinine, Ser: 1.09 mg/dL (ref 0.4–1.5)
Creatinine, Ser: 1.32 mg/dL (ref 0.4–1.5)
GFR calc Af Amer: >60 mL/min (ref >60)
GFR calc non Af Amer: 52 mL/min — ABNORMAL LOW (ref >60)
GFR calc non Af Amer: 54 mL/min — ABNORMAL LOW (ref >60)
GFR calc non Af Amer: >60 mL/min (ref >60)
Glucose, Bld: 128 mg/dL — ABNORMAL HIGH (ref 70–99)
Glucose, Bld: 129 mg/dL — ABNORMAL HIGH (ref 70–99)
Glucose, Bld: 142 mg/dL — ABNORMAL HIGH (ref 70–99)
Glucose, Bld: 153 mg/dL — ABNORMAL HIGH (ref 70–99)
Potassium: 4.7 mEq/L (ref 3.5–5.1)
Sodium: 138 mEq/L (ref 135–145)
Sodium: 138 mEq/L (ref 135–145)
Sodium: 139 mEq/L (ref 135–145)
Sodium: 140 mEq/L (ref 135–145)

## 2010-06-28 LAB — GLUCOSE, CAPILLARY
Glucose-Capillary: 145 mg/dL — ABNORMAL HIGH (ref 70–99)
Glucose-Capillary: 146 mg/dL — ABNORMAL HIGH (ref 70–99)
Glucose-Capillary: 150 mg/dL — ABNORMAL HIGH (ref 70–99)
Glucose-Capillary: 151 mg/dL — ABNORMAL HIGH (ref 70–99)
Glucose-Capillary: 166 mg/dL — ABNORMAL HIGH (ref 70–99)
Glucose-Capillary: 170 mg/dL — ABNORMAL HIGH (ref 70–99)
Glucose-Capillary: 170 mg/dL — ABNORMAL HIGH (ref 70–99)
Glucose-Capillary: 173 mg/dL — ABNORMAL HIGH (ref 70–99)

## 2010-06-28 LAB — BRAIN NATRIURETIC PEPTIDE
Pro B Natriuretic peptide (BNP): 1020 pg/mL — ABNORMAL HIGH (ref 0.0–100.0)
Pro B Natriuretic peptide (BNP): 959 pg/mL — ABNORMAL HIGH (ref 0.0–100.0)

## 2010-06-28 LAB — DIFFERENTIAL
Basophils Absolute: 0 10*3/uL (ref 0.0–0.1)
Basophils Absolute: 0 10*3/uL (ref 0.0–0.1)
Basophils Absolute: 0 10*3/uL (ref 0.0–0.1)
Basophils Relative: 0 % (ref 0–1)
Basophils Relative: 0 % (ref 0–1)
Eosinophils Absolute: 0 10*3/uL (ref 0.0–0.7)
Eosinophils Relative: 0 % (ref 0–5)
Eosinophils Relative: 0 % (ref 0–5)
Eosinophils Relative: 0 % (ref 0–5)
Lymphocytes Relative: 1 % — ABNORMAL LOW (ref 12–46)
Lymphocytes Relative: 5 % — ABNORMAL LOW (ref 12–46)
Lymphs Abs: 0.3 10*3/uL — ABNORMAL LOW (ref 0.7–4.0)
Lymphs Abs: 0.5 10*3/uL — ABNORMAL LOW (ref 0.7–4.0)
Lymphs Abs: 0.8 10*3/uL (ref 0.7–4.0)
Monocytes Absolute: 0.4 10*3/uL (ref 0.1–1.0)
Monocytes Absolute: 0.9 10*3/uL (ref 0.1–1.0)
Monocytes Relative: 11 % (ref 3–12)
Monocytes Relative: 3 % (ref 3–12)
Neutro Abs: 17.6 10*3/uL — ABNORMAL HIGH (ref 1.7–7.7)
Neutro Abs: 18.4 10*3/uL — ABNORMAL HIGH (ref 1.7–7.7)
Neutro Abs: 6.4 10*3/uL (ref 1.7–7.7)
Neutrophils Relative %: 93 % — ABNORMAL HIGH (ref 43–77)

## 2010-06-28 LAB — URINALYSIS, ROUTINE W REFLEX MICROSCOPIC
Glucose, UA: NEGATIVE mg/dL
Nitrite: NEGATIVE
Protein, ur: NEGATIVE mg/dL
pH: 5.5 (ref 5.0–8.0)

## 2010-06-28 LAB — CARDIAC PANEL(CRET KIN+CKTOT+MB+TROPI)
CK, MB: 5.3 ng/mL — ABNORMAL HIGH (ref 0.3–4.0)
CK, MB: 6.1 ng/mL — ABNORMAL HIGH (ref 0.3–4.0)
Relative Index: 3.5 — ABNORMAL HIGH (ref 0.0–2.5)
Relative Index: 3.8 — ABNORMAL HIGH (ref 0.0–2.5)
Total CK: 175 U/L (ref 7–232)
Troponin I: 0.08 ng/mL — ABNORMAL HIGH (ref 0.00–0.06)

## 2010-06-28 LAB — TSH: TSH: 0.618 u[IU]/mL (ref 0.350–4.500)

## 2010-06-28 LAB — CBC
HCT: 33.7 % — ABNORMAL LOW (ref 39.0–52.0)
HCT: 34 % — ABNORMAL LOW (ref 39.0–52.0)
Hemoglobin: 10.9 g/dL — ABNORMAL LOW (ref 13.0–17.0)
Hemoglobin: 11.2 g/dL — ABNORMAL LOW (ref 13.0–17.0)
Hemoglobin: 11.6 g/dL — ABNORMAL LOW (ref 13.0–17.0)
MCHC: 32.1 g/dL (ref 30.0–36.0)
MCHC: 32.2 g/dL (ref 30.0–36.0)
MCHC: 32.3 g/dL (ref 30.0–36.0)
MCV: 91.8 fL (ref 78.0–100.0)
Platelets: 363 10*3/uL (ref 150–400)
Platelets: 408 10*3/uL — ABNORMAL HIGH (ref 150–400)
Platelets: 458 10*3/uL — ABNORMAL HIGH (ref 150–400)
RBC: 3.8 MIL/uL — ABNORMAL LOW (ref 4.22–5.81)
RDW: 14.3 % (ref 11.5–15.5)
RDW: 14.6 % (ref 11.5–15.5)
RDW: 14.8 % (ref 11.5–15.5)
RDW: 14.8 % (ref 11.5–15.5)
WBC: 16.5 10*3/uL — ABNORMAL HIGH (ref 4.0–10.5)
WBC: 18.3 10*3/uL — ABNORMAL HIGH (ref 4.0–10.5)

## 2010-06-28 LAB — PROTIME-INR
INR: 1.2 (ref 0.00–1.49)
INR: 1.3 (ref 0.00–1.49)
INR: 1.4 (ref 0.00–1.49)
Prothrombin Time: 16.3 seconds — ABNORMAL HIGH (ref 11.6–15.2)
Prothrombin Time: 17.6 seconds — ABNORMAL HIGH (ref 11.6–15.2)
Prothrombin Time: 19.9 seconds — ABNORMAL HIGH (ref 11.6–15.2)

## 2010-06-28 LAB — LIPID PANEL
Cholesterol: 116 mg/dL (ref 0–200)
LDL Cholesterol: 84 mg/dL (ref 0–99)
Triglycerides: 78 mg/dL (ref <150)
VLDL: 16 mg/dL (ref 0–40)

## 2010-06-28 LAB — POCT CARDIAC MARKERS
CKMB, poc: 4.3 ng/mL (ref 1.0–8.0)
Myoglobin, poc: 321 ng/mL (ref 12–200)

## 2010-06-28 LAB — APTT: aPTT: 41 seconds — ABNORMAL HIGH (ref 24–37)

## 2010-06-29 ENCOUNTER — Other Ambulatory Visit: Payer: Self-pay | Admitting: General Surgery

## 2010-06-29 ENCOUNTER — Encounter (HOSPITAL_COMMUNITY): Payer: Medicare Other

## 2010-06-29 ENCOUNTER — Ambulatory Visit (INDEPENDENT_AMBULATORY_CARE_PROVIDER_SITE_OTHER): Payer: MEDICARE | Admitting: *Deleted

## 2010-06-29 DIAGNOSIS — R0989 Other specified symptoms and signs involving the circulatory and respiratory systems: Secondary | ICD-10-CM

## 2010-06-29 LAB — CBC
HCT: 40.2 % (ref 39.0–52.0)
Hemoglobin: 12.7 g/dL — ABNORMAL LOW (ref 13.0–17.0)
WBC: 4 10*3/uL (ref 4.0–10.5)

## 2010-06-29 LAB — BASIC METABOLIC PANEL
GFR calc non Af Amer: >60 mL/min (ref >60)
Potassium: 4.5 mEq/L (ref 3.5–5.1)
Sodium: 138 mEq/L (ref 135–145)

## 2010-06-29 LAB — SURGICAL PCR SCREEN
MRSA, PCR: NEGATIVE
Staphylococcus aureus: NEGATIVE

## 2010-06-29 LAB — PROTIME-INR: INR: 2.39 — ABNORMAL HIGH (ref 0.00–1.49)

## 2010-06-29 LAB — APTT: aPTT: 40 seconds — ABNORMAL HIGH (ref 24–37)

## 2010-06-29 NOTE — Patient Instructions (Signed)
INR 2.39 Resume coumadin

## 2010-07-04 ENCOUNTER — Ambulatory Visit (HOSPITAL_COMMUNITY)
Admission: RE | Admit: 2010-07-04 | Discharge: 2010-07-04 | Disposition: A | Discharge status: Home / Self Care | Payer: Medicare Other | Source: Ambulatory Visit | Attending: General Surgery | Admitting: General Surgery

## 2010-07-04 DIAGNOSIS — J4489 Other specified chronic obstructive pulmonary disease: Secondary | ICD-10-CM | POA: Insufficient documentation

## 2010-07-04 DIAGNOSIS — Z01812 Encounter for preprocedural laboratory examination: Secondary | ICD-10-CM | POA: Insufficient documentation

## 2010-07-04 DIAGNOSIS — J449 Chronic obstructive pulmonary disease, unspecified: Secondary | ICD-10-CM | POA: Insufficient documentation

## 2010-07-04 DIAGNOSIS — Z7901 Long term (current) use of anticoagulants: Secondary | ICD-10-CM | POA: Insufficient documentation

## 2010-07-04 DIAGNOSIS — I10 Essential (primary) hypertension: Secondary | ICD-10-CM | POA: Insufficient documentation

## 2010-07-04 DIAGNOSIS — Z79899 Other long term (current) drug therapy: Secondary | ICD-10-CM | POA: Insufficient documentation

## 2010-07-04 DIAGNOSIS — K4090 Unilateral inguinal hernia, without obstruction or gangrene, not specified as recurrent: Secondary | ICD-10-CM | POA: Insufficient documentation

## 2010-07-04 LAB — PROTIME-INR
INR: 1.31 (ref 0.00–1.49)
Prothrombin Time: 16.5 seconds — ABNORMAL HIGH (ref 11.6–15.2)

## 2010-07-11 ENCOUNTER — Other Ambulatory Visit: Payer: Self-pay | Admitting: Family Medicine

## 2010-07-18 ENCOUNTER — Ambulatory Visit (INDEPENDENT_AMBULATORY_CARE_PROVIDER_SITE_OTHER): Payer: Medicare Other | Admitting: *Deleted

## 2010-07-18 DIAGNOSIS — I4891 Unspecified atrial fibrillation: Secondary | ICD-10-CM

## 2010-07-18 DIAGNOSIS — Z7901 Long term (current) use of anticoagulants: Secondary | ICD-10-CM

## 2010-07-18 NOTE — Op Note (Signed)
NAMEBRAYAM, BOEKE NO.:  [id number]  MEDICAL RECORD NO.:  [id number]           PATIENT TYPE:  O  LOCATION:  [facility]                          FACILITY:  [facility]  PHYSICIAN:  Tilford Pillar, MD      DATE OF BIRTH:  07-11-1940  DATE OF PROCEDURE:  07/04/2010 DATE OF DISCHARGE:  07/04/2010                              OPERATIVE REPORT   PREOPERATIVE DIAGNOSIS:  Left inguinal hernia.  POSTOPERATIVE DIAGNOSIS:  Left inguinal hernia.  PROCEDURE:  Left inguinal hernia repair with mesh.  SURGEON:  Dr. Tilford Pillar  ANESTHESIA:  Spinal and local anesthetic.  SPECIMEN:  None.  ESTIMATED BLOOD LOSS:  Minimal.  INDICATIONS:  The patient is a 70 year old male who presented to my office with a bulge in the left groin.  Risks, benefits, and alternatives of left inguinal hernia repair with mesh were discussed at length with the patient including but not limited to risk of bleeding, infection, infection of the mesh requiring removal and subsequent repair, possible recurrence, paresthesias, nerve entrapment, and chronic pain, ischemic orchitis, testicular and possible testicular loss as well as possibility of entrapped cardiac and pulmonary events.  His questions and concerns were addressed, and the patient was consented for the planned procedure.  OPERATION:  The patient was taken to the operating room and was placed in the supine position on the operating table after spinal anesthetic was administered by Anesthesia.  At this point, after confirming the spinal anesthetic had taken effect, his left groin was prepped and draped in standard fashion.  Incision was created in the left groin with a 10 blade scalpel additional dissection down through the subcu tissue was carried out using electrocautery including division of Scarpa fascia.  This dissection was carried out down to the external oblique fascia which was scored with a 15 blade scalpel and opened medially to the  external inguinal ring with Metzenbaum scissors.  At this point, the cord structures are dissected free from the surrounding canal walls including the hernia sac.  A window was created bluntly behind the cord structures over the pubic tubercle with blunt digital dissection.  A Penrose drain was utilized to help retraction of the cord structures. At this time, I carefully dissected the hernia sac off of the cord structures, once freed the hernia sac was twisted upon itself in a high ligation fashion with 0 Ethibond suture.  The redundant sac was excised and disposed off.  A large mesh plug was then inserted into the internal inguinal ring and secured with 0 Ethibond suture x2 to pex the mesh in location.  The mesh onlay was then placed pexing medially over the pubic tubercle inferiorly to the shelving portion of the inguinal ligament superiorly to the conjoined tendon and laterally to the keyhole defect is secured and the tails were placed underneath of the external oblique fascia.  The pexing sutures were all carried out using 0 Ethibond suture in simple interrupted fashion.  At this point, I was quite pleased with the appearance of the repair.  The wound was irrigated the cord structures were returned back into the inguinal canal.  The  external oblique fascia was reapproximated using a 2-0 Vicryl.  The local anesthetic was instilled.  A 3-0 Vicryl was utilized to reapproximate Scarpa fascia and then a 4-0 Monocryl in running subcu sutures utilized to reapproximate the skin edges.  Skin was washed and dried with moist dry towel.  Benzoin was applied around the incision.  Half inch Steri- Strips were placed.  Drapes were removed.  The patient was allowed to come out of general anesthetic and was transferred back to regular hospital bed in stable condition.  At the conclusion of the procedure all instrument, sponge, and needle counts were correct.  The patient tolerated the procedure  extremely well.     Tilford Pillar, MD     BZ/MEDQ  D:  07/15/2010  T:  07/15/2010  Job:  [id number]  Electronically Signed by Tilford Pillar MD on 07/18/2010 08:54:14 AM

## 2010-07-26 NOTE — Group Therapy Note (Signed)
NAMEBAIN, WHICHARD NO.:  [id number]   MEDICAL RECORD NO.:  [id number]          PATIENT TYPE:  INP   LOCATION:  A211                          FACILITY:  [facility]   PHYSICIAN:  Skeet Latch, DO    DATE OF BIRTH:  12/13/1940   DATE OF PROCEDURE:  04/05/2007  DATE OF DISCHARGE:                                 PROGRESS NOTE   SUBJECTIVE:  Mr. Novosel states that he is doing wonderfully today. The  patient states that his shortness of breath has improved. The patient  denies any chest pain, abdominal pain, altered mental status, dizziness,  or light-headedness at this time. The patient still has some wheezy-  typeepisodes, but overall he states that he is feeling better.   OBJECTIVE:  VITAL SIGNS:  Temperature 98.2, pulse 76, respirations 16,  blood pressure 134/79.  GENERAL:  He is a 70 year old African-American male. He is well  nourished, well hydrated, and well developed in no acute distress.   PHYSICAL EXAMINATION:  LUNGS:  He is wheezing. Noted bilaterally. Slight  rales at the bases.  HEART: Irregularly irregular rhythm, no murmurs appreciated.  ABDOMEN:  Nontender, soft, positive bowel sounds.  EXTREMITIES:  Trace edema.  NEUROLOGIC:  He is alert and oriented x3. Cranial nerves II-XII are  grossly intact.   Chest x-ray showed cardiomegaly with mild interstitial edema and small  effusions.   LABORATORY DATA:  Lipid panel shows a cholesterol of 146, triglycerides  61, HDL 29, LDL 105. BNP 615. His phosphorus is 3.6, magnesium 2.0,  sodium 136, potassium 4.3, chloride 99, CO2 31, glucose 152, BUN 21,  creatinine 1.30, white count 4.4, hemoglobin 12.1, hematocrit 38.0,  platelets 234, PTT 43, PT 24.3, INR 2.1.   ASSESSMENT AND PLAN:  1. Shortness of breath, probably secondary to his CHF. The patient      continues to have IV diuresis at this time and he seems to be      improving.  2. Atrial fibrillation. The patient's Toprol has been increased to 25   mg b.i.d. The patient continues to be on telemetry.  3. Chronic obstructive pulmonary disease. The patient is on IV Solu-      Medrol and this will be continued at this time. The patient is also      getting breathing treatments every 4 hours p.r.n.   We will continue to follow him. Hopefully, the patient will be  discharged in the next 1 to 2 days as stable.      Skeet Latch, DO  Electronically Signed     SM/MEDQ  D:  04/05/2007  T:  04/06/2007  Job:  [id number]

## 2010-07-26 NOTE — Assessment & Plan Note (Signed)
[facility]                       [place] [facility] OFFICE NOTE   Theodore [name], Theodore [name]                     MRN:          [id number]  DATE:08/17/2006                            DOB:          10/14/1940    IDENTIFICATION:  Theodore [name] is a 70 year old gentleman with a history of  atrial fibrillation and presumed diastolic dysfunction.  I last saw him  in clinic back in April.   In the interval he has done okay.  He says he is walking some, he gets  out of breath a little at the end of the walk.  He denies any PND, no  palpitations, no dizziness, notes some ankle swelling.   CURRENT MEDICATIONS:  1. ProAir 2 puffs q.i.d.  2. Metoprolol ER 25 b.i.d.  3. Lasix 120 daily.  4. Spiriva b.i.d.  5. Prevacid 30 daily.  6. Coumadin as directed.  7. Diltiazem ER 360 daily.   PHYSICAL EXAMINATION:  On exam the patient is in no distress.  Blood  pressure is 142/78, pulse 72 and irregular, weight is 187 up 5 pounds  from previous.  NECK:  JVP approximately 10.  LUNGS:  Rales bilateral bases.  CARDIAC:  Irregularly irregular, S1-S2, no S3, no significant murmurs.  ABDOMEN:  No hepatomegaly.  EXTREMITIES:  Trace to 1+ pitting edema.   IMPRESSION:  1. Atrial fibrillation, will set the patient up for a Holter monitor.      Evaluate his 24 hour rate control.  2. Diastolic dysfunction with congestive heart failure.  We will check      a BMET, BNP today.  I have told him to take 4 Lasix tomorrow with      an extra potassium.  Be in touch with him regarding the labs in      order to proceed.   Tentatively I will set followup for 2 months, sooner if problems  develop.  I will be in touch with him regarding the test results.     Pricilla Riffle, MD, [facility]  Electronically Signed    PVR/MedQ  DD: 08/17/2006  DT: 08/17/2006  Job #: [id number]

## 2010-07-26 NOTE — Discharge Summary (Signed)
NAMESANTANNA, Theodore [name] NO.:  [id number]   MEDICAL RECORD NO.:  [id number]          PATIENT TYPE:  INP   LOCATION:  A211                          FACILITY:  [facility]   PHYSICIAN:  Skeet Latch, DO    DATE OF BIRTH:  [birth year]   DATE OF ADMISSION:  04/04/2007  DATE OF DISCHARGE:  01/24/2009LH                               DISCHARGE SUMMARY   DISCHARGE DIAGNOSES:  1. Acute exacerbation of congestive heart failure.  2. Chronic destructive pulmonary disease.  3. Chronic atrial fibrillation.  4. Hypertension.  5. History of asthma.   BRIEF HOSPITAL COURSE:  This is a 66-year African American male with a  history of CHF, COPD, atrial fibrillation who presented to the hospital  with progressive shortness of breath.  The patient states over the last  few days he had progressive shortness of breath.  The patient usually  has exertional dyspnea but states it got severe and his inhaler  nebulizers did not work, so the patient came to the emergency room for  evaluation.  The patient was seen in the emergency room.  Initial chest  x-ray showed stable cardiomegaly with increase in mild interstitial  edema with some small effusions.  The patient was admitted to telemetry  unit with strict I's and O's.  The patient was given IV steroids and  nasal cannula oxygen.  The patient was placed on DVT and GI prophylaxis.  The patient was placed on Lasix IV, placed on his home medications which  included diltiazem and Toprol-XL.  Secondary to his CHF, cardiology was  consulted and they agreed with IV diuresis, increased his IV Lasix.  His  Toprol-XL was also increased.  The patient's Coumadin was adjusted by  pharmacy also.  The patient's breathing has improved and the patient is  anticipating being discharged today.   RADIOLOGY FINDINGS:  Chest x-ray showed:  1. Cardiomegaly with increasing central venous congestion and mild      interstitial edema.  2. Small bilateral pleural  effusions.  3. No focal infiltrates.  4. Prominent appearance of the hila that is unchanged from multiple      appearances.   HOME MEDICATIONS:  Will include:  1. Metoprolol tartrate 25 mg one tablet twice a day.  2. Diltiazem 360 mg daily.  3. Continue his previous dose of his potassium.  4. Lasix 40 mg daily.  5. ProAir inhaler two puffs q.6h.  6. Albuterol 2.5/3 mL four times a day.  7. Spiriva inhaler daily.  8. Warfarin 4 mg:  Monday, Wednesday, Friday, Sunday, two tablets; on      Tuesday, Thursday, Saturday one-and-a-half tablets.  9. The patient will be sent home on a prednisone taper.   VITAL SIGNS ON DISCHARGE:  Temperature is 97.6, pulse 86, respirations  24, blood pressure 131/74, saturating 95% on room air.   LABORATORIES:  His PT is 23.5, INR 2.0.  BNP is 350.  Sodium 137,  potassium 4.2, chloride 96, CO2 32, glucose 141, BUN 34, creatinine  1.75.  White count 10.9, hemoglobin 11.8, hematocrit 36.8, platelets  244.   CONDITION ON DISCHARGE:  Stable.   DISPOSITION:  The patient will be discharged to home.   INSTRUCTIONS:  The patient is to follow up with Dr. Jen Mow in the next 1-2  weeks.  I will defer to her regarding the patient's appointment with  cardiology.  The patient is to maintain low-salt heart-healthy diet.  He  is to increase his activity slowly.   I explained in detail about the patient's diet and need to decrease his  salt intake, decrease sodas and any added salt to his food.  Explained  to the patient regarding his congestive heart failure and fluid intake.  He needs to decrease his fluid intake also and take his diuretics as  directed.  The patient is told to return to the emergency room if he has  severe shortness of breath or any increasing severe edema on his lower  extremities.  I believe the patient understands these instructions.      Skeet Latch, DO  Electronically Signed     SM/MEDQ  D:  04/06/2007  T:  04/07/2007  Job:   [id number]   cc:   Erle Crocker, M.D.

## 2010-07-26 NOTE — Consult Note (Signed)
NAMEMACAULEY, MOSSBERG NO.:  [id number]   MEDICAL RECORD NO.:  [id number]          PATIENT TYPE:  INP   LOCATION:  A211                          FACILITY:  [facility]   PHYSICIAN:  Pricilla Riffle, MD, FACCDATE OF BIRTH:  1940/09/28   DATE OF CONSULTATION:  DATE OF DISCHARGE:                                 CONSULTATION   IDENTIFICATION:  Mr. Brisbin is a gentleman I follow in clinic.  We are  asked to see regarding atrial fibrillation with rapid response and CHF.   HISTORY OF PRESENT ILLNESS:  I saw the patient in clinic not that long  ago, he said over the past week to week and a half he has had worsening  shortness of breath.  He denies chest pain.  He also said he felt his  heart rate going faster some, 1 episode of dizziness, but no syncope.  He denies PND.  He has chronic two-pillow orthopnea, has some yellowish  sputum, but has been chronic no fevers, chills.   ALLERGIES:  None.   MEDICATIONS:  Prior to hospitalization include:  1. Pro Air 2 puffs daily.  2. Metoprolol ER 25 daily.  3. Lasix 20/40 daily.  4. Spiriva b.i.d.  5. Coumadin.  6. Diltiazem 360.  7. Penicillin 500 t.i.d., question when started here in the hospital.  8. Lasix 40 IV daily.  9. Diltiazem 360.  10.Toprol XL 25.  11.Solu-Medrol 80, then 60 IV q. 8.  12.Nicotine.  13.Xopenex.   PAST MEDICAL HISTORY:  1. Atrial fibrillation.  2. COPD/asthma/bronchitis.  3. History of diastolic dysfunction.   SOCIAL HISTORY:  The patient has a history of smoking, does not drink.   FAMILY HISTORY:  Noncontributory.   REVIEW OF SYSTEMS:  No fevers, no chills.  No nasal discharge.  Otherwise, HEENT:  Negative.  CARDIAC:  No chest pain.  Trace edema.  Occasional palpitations, presyncope, but no syncope.  GU:  Negative.  MUSCULOSKELETAL:  Negative.  GI:  Negative.  No nausea, vomiting.  Appetite okay.  Endocrine negative.  Otherwise all systems reviewed  negative to the above problem, except as noted  above.   PHYSICAL EXAMINATION:  GENERAL:  On exam the patient is in no acute  distress.  VITAL SIGNS:  Blood pressure was 140s systolic.  Pulse was in the 100s-  130s, atrial fibrillation.  Respiratory rate was 20.  He is in no acute  distress now on the telemetry floor.  HEENT:  Normocephalic, atraumatic.  EOMI.  PERRL.  Mucous membranes are  moist.  NECK:  Increased JVP.  No bruits.  LUNGS:  Moving air, mild wheezes with forced expiration.  Rales at  bases.  CARDIAC:  Irregularly irregular, S1-S2, question S3.  No significant  murmurs.  ABDOMEN:  Slightly distended.  No frank hepatomegaly.  Supple,  nontender.  EXTREMITIES:  Trace edema, 2+ pulses.  MUSCULOSKELETAL:  Deferred.  NEUROLOGICAL:  Alert and oriented x3.  Cranial nerves II-XII grossly  intact.  Strength - moving all extremities, otherwise deferred.   Chest x-ray shows stable cardiomegaly with mild interstitial edema and  small effusions.  A 12-lead ELECTROCARDIOGRAM, atrial fibrillation with occasional PVCs,  left anterior fascicular block, possible inferior MI.   Labs significant for a hemoglobin of 12.7, WBC of 7.3, platelets of 230,  BUN/creatinine of 20/1.45, potassium 3.8, INR of 2, troponin less than  0.05.  BNP 974 was 379 in clinic a few weeks ago, MB of 4.   IMPRESSION:  The patient is very familiar to me from clinic.  He has a  history of significant COPD, moderate LVH on echo, CHF, presents  secondary to diastolic dysfunction and atrial fibrillation. Note his  last labs as an outpatient was BNP of 379.  He has been using 40 of  Lasix daily, at 60 he gets leg cramps.   He presents with 1-week history of shortness of breath, found to be in  CHF with rapid atrial fibrillation.  Agree with IV diuresis.  Would  increase to 40 IV b.i.d., watch K and creatinine, continue __________ ,  increase Toprol to 25 b.i.d. Agree with empiric steroids until situation  is cleared.   Would recommend a dietary consult  as the patient says he had salt at  home and changed diet to a low salt diet.  Atrial fibrillation as noted  above.  Continue Coumadin.   Otherwise, we will continue to follow with you.      Pricilla Riffle, MD, [facility]  Electronically Signed     PVR/MEDQ  D:  04/04/2007  T:  04/05/2007  Job:  [id number]

## 2010-07-26 NOTE — Discharge Summary (Signed)
NAMEBRANON, SABINE NO.:  [id number]   MEDICAL RECORD NO.:  [id number]          PATIENT TYPE:  INP   LOCATION:  A331                          FACILITY:  [facility]   PHYSICIAN:  Dorris Singh, DO    DATE OF BIRTH:  23-Jul-1940   DATE OF ADMISSION:  04/17/2008  DATE OF DISCHARGE:  02/09/2010LH                               DISCHARGE SUMMARY   ADDENDUM:   PRIMARY CARE PHYSICIAN:  Dr. Jen Mow.   CONSULTATIONS:  [facility].   ADMISSION DIAGNOSES:  1. Pneumonia.  2. Anemia.  3. History of congestive heart failure.  4. History of atrial fibrillation.  5. History of hypertension.   DISCHARGE DIAGNOSES:  1. Pneumonia, which is resolving.  2. Congestive heart failure.  3. History of atrial fibrillation.  4. Coumadin therapy.  5. History of hypertension.  6. History of anemia.  7. Claudication.  8. Bronchitis.   The patient's testing that was done includes:  On April 17, 2008, he  had a two-view chest x-ray which demonstrated cardiomegaly with  pulmonary venous hypertension and question pulmonary arterial  hypertension, chronic bronchitic changes.  On April 17, 2008, he had  a one-view chest x-ray which showed suspicion for a lingular infiltrate,  also infiltrate at the right base, consider follow-up chest radiograph  post therapy to assess for clearing.  A two-view chest x-ray in  February, no focal acute chronic cardiomegaly.   The patient's H and P was done by Dr. Lilian Kapur, please refer.  To  summarize, the patient is a 70 year old African American male who was  admitted with the probability of having pneumonia on chest x-ray.  He  was started on antibiotic therapy and also placed on nebulizer  treatments.  He continued to improve without any problems.  Another  chest x-ray was obtained, which did not show pneumonia, however, he had  an elevated BNP at this time, so cardiology was consulted to manage  this.  The patient continued to do well  without any problem.  He was  also complaining of some issues of possible claudication and cardiology  had decided that they will follow this up outpatient.  While he was  here, he was placed on Lasix 60 mg p.o. daily; however, he will be sent  back home on 40 mg p.o. daily with close follow-up with his primary care  physician due to the concerns of worsening renal function if not  followed up properly and monitored while he is on such high doses of  Lasix.  The patient continued to do well, however, he never looked  clinically sick.  [facility] stated that from their standpoint  he could be discharged to follow up with them outpatient.  He should  follow up with Dr. Jen Mow to manage his Coumadin therapy.   His will be sent home on the following medications:  1. Spiriva one inhalation daily.  2. Albuterol sulfate as needed.  3. __________ XT 360 mg daily.  4. Warfarin 4 mg 2 pills daily, except on Tuesday and Friday take 3.  5. Klor-Con 20 mEq daily.  6. Metoprolol 25  mg twice a day.  7. Torsemide 40 mg daily.  8. Levaquin 500 mg p.o. x5 days.  9. Z-Pak to use as directed.  10.Tessalon Perles 100 mg p.o. daily.   DISCHARGE INSTRUCTIONS:  The patient is to follow up Dr. Jen Mow in 1-3  days.  I recommend that he have BNP drawn and his Lasix can be adjusted  as necessary.  He is to follow up with Dr. Dietrich Pates on April 30, 2008, and also he is suppose to follow up with radiology on April 30, 2008, to have an arterial study done for possible claudication.  He is  to take medications as recommended.  He is to continue nebulizer  treatments.  He is to return if symptoms worsen.      Dorris Singh, DO  Electronically Signed     CB/MEDQ  D:  04/21/2008  T:  04/21/2008  Job:  [id number]   cc:   Erle Crocker, M.D.

## 2010-07-26 NOTE — Group Therapy Note (Signed)
Theodore [name] NO.:  [id number]   MEDICAL RECORD NO.:  [id number]          PATIENT TYPE:  INP   LOCATION:  A331                          FACILITY:  [facility]   PHYSICIAN:  Dorris Singh, DO    DATE OF BIRTH:  October 25, 1940   DATE OF PROCEDURE:  04/19/2008  DATE OF DISCHARGE:                                 PROGRESS NOTE   Patient seen today on oxygen.  States that he does not feel bad, is  surprised that he is still here.  I discussed with them that, you know,  even though clinically he was looking better his BNP was actually quite  elevated and we are going to try to diurese him a little bit more and  have cardiology see him as well.   VITALS:  Are as follows:  Temperature 97.9, pulse 97, respirations 20,  blood pressure 127/85.  GENERAL:  The patient is a 70 year old African American male who is well-  developed, well-nourished, in no acute distress.  HEART:  Regular rate and rhythm.  LUNGS:  Clear to auscultation bilaterally.  ABDOMEN:  Soft, nontender, nondistended.  EXTREMITIES:  Positive pulses.  No ecchymosis or cyanosis, +1 pitting  edema.   LABS FOR TODAY:  Are as follows:  A white count of 16.5, hemoglobin of  11.2, hematocrit of 35, platelet count of 383, glucose of 173, sodium of  134, potassium of 4.9, chloride 99, CO2 26, glucose 142, BUN 20 and  creatinine 1.32.  Also had a BNP of 1070 and we have been monitoring his  cardiac enzymes which were 0.1.   ASSESSMENT AND PLAN:  1. Elevated BNP.  2. Patient was originally diagnosed with pneumonia, however there is      no acute findings.  There was some concern with his first chest      radiograph that he could have been here for pneumonia.  However, he      does have a white count today.  Will go ahead and continue to      monitor his white count and any other vital signs as well and start      him empirically on antibiotics.  Await cardiology consult for the      morning and I have ordered a  2-D echocardiogram on him.      Dorris Singh, DO  Electronically Signed     CB/MEDQ  D:  04/19/2008  T:  04/19/2008  Job:  [id number]

## 2010-07-26 NOTE — Consult Note (Signed)
[name], [name] NO.:  [id number]   MEDICAL RECORD NO.:  [id number]          PATIENT TYPE:  INP   LOCATION:  A331                          FACILITY:  [facility]   PHYSICIAN:  Theodore Sidle, MD DATE OF BIRTH:  [birth year]   DATE OF CONSULTATION:  04/20/2008  DATE OF DISCHARGE:                                 CONSULTATION   PRIMARY CARE PHYSICIAN:  Theodore [name], M.D.   CARDIOLOGIST:  Previously followed by Dr. Dietrich [name].   REASON FOR CONSULTATION:  Congestive heart failure.   HISTORY OF PRESENT ILLNESS:  [name] is a 70 year old male patient  with a history of permanent atrial fibrillation on chronic Coumadin  therapy followed by his primary care physician, as well as a history of  chronic diastolic congestive heart failure, who missed several of his  medications for the 2 days prior to presenting to the hospital.  He  presented to [facility] with development of cough and increased  sputum production and dyspnea with exertion.  His sputum was non  purulent.  He denied any hemoptysis.  He did have a chest x-ray in the  emergency room that was suspicious for pneumonia.  Specifically, he had  suspicion for lingular infiltrate and also infiltrate of the right base.  He was admitted for IV antibiotic therapy.  He was placed back on his  Lasix on an IV formulation.  Follow up chest x-ray showed no acute  disease.  His initial white count was normal, and he had no fever.  His  BNP was noted to be elevated.  It was in the 1000 range at one point.  We are therefore asked to further evaluate.  The patient now has an  elevated white count on Solu-Medrol therapy.  He feels better in regards  to cough and shortness of breath.  He has noted some chest tightness  only with cough and with use of his albuterol.  He sleeps on 2 pillows  chronically.  He does note some mild pedal edema.  He denies paroxysmal  nocturnal dyspnea.  He denies any syncope.   PAST  MEDICAL HISTORY:  As outlined above.  He also has a history of:  1. COPD.  2. Hypertension.  3. He reports a history of hyperlipidemia but is not on any therapy.  4. Echocardiogram in March of 2008 demonstrated moderately decreased      RV systolic function, mild to moderate increased RV systolic      pressure, low normal LV function, moderate LVH.   MEDICATIONS AT HOME:  1. Spiriva daily.  2. Albuterol.  3. Cartia XT 360 mg daily.  4. Warfarin as directed.  5. Potassium 20 mEq daily.  6. Metoprolol XL 25 mg b.i.d.  7. Furosemide 40 mg daily.   ALLERGIES:  No known drug allergies.   SOCIAL HISTORY:  The patient lives in [place] with his wife.  He has  a 50 plus pack year history of smoking.  He continues to smoke 1-2  cigarettes per day.  Denies alcohol abuse.   FAMILY HISTORY:  Significant for cancer in  his mother (unknown type).  She died in her 67s.  His father died of colon cancer.   REVIEW OF SYSTEMS:  Please see history of present illness.  Denies  fevers, but has had chills.  Denies weight changes.  Denies headache or  rash.  Denies dysuria or hematuria.  Denies bright red blood per rectum  or melena.  Denies dysphagia.  He does note some leg pain with  ambulation.  This is suspicious for claudication.  The rest of the  review of systems are negative.   PHYSICAL EXAMINATION:  GENERAL:  He is a well-nourished, well-developed  male in no acute distress.  VITAL SIGNS:  Blood pressure is 131/72, pulse 65, respirations 20,  temperature 96.9, oxygen saturation 96% on 2 liters.  HEENT:  Normal.  NECK:  Without JVD.  LYMPH:  Without lymphadenopathy.  ENDOCRINE:  Without thyromegaly.  CARDIAC:  Normal S1 and S2.  Irregularly irregular rhythm.  No murmur.  LUNGS:  Bibasilar crackles.  Otherwise clear.  SKIN:  Without rash.  ABDOMEN:  Soft, nontender with normoactive bowel sounds.  No  organomegaly.  EXTREMITIES:  Trace edema bilaterally.  MUSCULOSKELETAL:  Without joint  deformity.  NEUROLOGIC:  He is alert and oriented x3.  Cranial nerves II-XII are  grossly intact.  VASCULAR:  No carotid bruits noted bilaterally.  Dorsalis pedis and  posterior tibialis pulses are diminished bilaterally.   CHEST X-RAY:  Chest x-ray from April 17, 2008 is as outlined above.  Chest x-ray from April 19, 2008 reveals no acute finding of  cardiomegaly.   ELECTROCARDIOGRAM:  Atrial fibrillation with heart rate of 62, leftward  axis, T wave inversions in III, aVF and V5 through 6.   LABORATORY DATA:  White count 18,300, hemoglobin 11, hematocrit 34,  platelet count 408,000.  Sodium 138, potassium 4.7, BUN 28, creatinine  1.37, glucose 129.  BNP of 893.  TSH of 0.618.  CK of 99, 139, 175.  CK-  MB of 3.2, 5.3, 6.1.  Troponin I of 0.1, 0.08, 0.1.  INR of 1.4.  Urine  culture no growth.   ASSESSMENT:  1. Probable acute-on-chronic diastolic congestive heart failure in a      patient with noted normal left ventricular function in the past.      He does note noncompliance with medications recently.  2. Questionable chronic obstructive pulmonary disease exacerbation.  3. Hypertension.  4. Permanent atrial fibrillation on chronic Coumadin therapy with      controlled ventricular rate.  5. Questionable lower extremity claudication.  6. Medical noncompliance.  7. Tobacco abuse.   RECOMMENDATIONS:  The patient was also interviewed and examined by Dr.  Diona [name].  The patient presents with shortness of breath and cough.  He  does have an element of congestive heart failure by chest x-ray.  His  heart rate is controlled in permanent atrial fibrillation.  He does have  a minor increase in his troponin without anginal symptoms.  He seems to  have improved on IV antibiotics, IV Lasix and IV Solu-Medrol.  It is  questionable that he may have superimposed chronic obstructive pulmonary  disease exacerbation in the setting of his acute-on-chronic diastolic  congestive heart failure.   At this point, we plan to proceed with a 2-D  echocardiogram and adjust his medical therapy with diuresis.  We do not  plan ischemic work-up at this time unless he has left ventricular  systolic dysfunction or new wall motion abnormalities.   Thank you very much for  the consultation.  We will be glad to follow the  patient throughout the remainder of this admission.      Tereso Newcomer, PA-C      Theodore Sidle, MD  Electronically Signed    SW/MEDQ  D:  04/20/2008  T:  04/20/2008  Job:  [id number]   cc:   Dorris Singh, DO   Theodore [name], M.D.

## 2010-07-26 NOTE — Assessment & Plan Note (Signed)
[facility]                       [place] [facility]ARDI[facility] OFFICE NOTE   Teng, Decou OVIDIO STEELE                     MRN:          [id number]  DATE:10/19/2006                            DOB:          [birth year]    IDENTIFICATION:  Mr. Phung is a 69 year old gentleman with a history of  atrial fibrillation and presumed diastolic dysfunction.  I saw him back  in June.  When I saw him last I set him up for Holter monitor.  He wore  this for 24 hours.  Average heart rate was actually 75 beats per minute  (atrial fibrillation), rates from 43 to 140.  I also checked a BNP and  BMET.  BNP was slightly elevated at 422.  I had instructed him to take  extra Lasix every other day for a few days with extra potassium.  He is  not on a true potassium supplement.   On talking to the patient he says he has noticed his fluid was better,  ankles down.  He takes extra Lasix every now and then.  He does say he  has had a cold for the past 2 months with some wheezing, cough  productive of mild, yellow sputum.   CURRENT MEDICATIONS:  1. ProAir 2 puffs q.i.d.  2. Metoprolol 25 b.i.d.  3. Lasix 120 daily.  4. Spiriva b.i.d.  5. Prevacid 30.  6. Warfarin 4 as directed.  7. Diltiazem ER 360.   PHYSICAL EXAMINATION:  On exam, the patient is in no distress.  Blood pressure is 140/72, pulse is 68 and irregular.  Weight is 189, up  from 187 last visit.  LUNGS:  Mild wheezes throughout.  No rales.  CARDIAC EXAM:  Irregularly irregular, S1, S2, no S3.  ABDOMEN:  Slightly distended, supple, nontender, no hepatomegaly.  EXTREMITIES:  Trivial edema.   IMPRESSION:  1. Wheezing.  I would check a BMET, BNP, as well as a CBC today and a      chest x-ray.  I am not convinced he has significant volume      overload, but will compare the films.  Be in touch with him with      the results.  2. Atrial fibrillation.  Adequate rate control, continue.   I will set to see the patient  tentatively in 3-4 months, but we will be  in touch with him regarding the results and forward to Dr. Jen Mow' office.     Pricilla Riffle, MD, [facility]  Electronically Signed    PVR/MedQ  DD: 10/19/2006  DT: 10/19/2006  Job #: [id number]   cc:   Erle Crocker, M.D.

## 2010-07-26 NOTE — H&P (Signed)
NAMETYLEK, BONEY NO.:  [id number]   MEDICAL RECORD NO.:  [id number]          PATIENT TYPE:  INP   LOCATION:  A211                          FACILITY:  [facility]   PHYSICIAN:  Skeet Latch, DO    DATE OF BIRTH:  Jun 07, 1940   DATE OF ADMISSION:  04/04/2007  DATE OF DISCHARGE:  LH                              HISTORY & PHYSICAL   PRIMARY CARE PHYSICIAN:  Dr. Jen Mow.   CHIEF COMPLAINT:  Shortness of breath.   HISTORY OF PRESENT ILLNESS:  This is a 70 year old African-American male  who has a history of CHF, COPD, atrial fibrillation, who presented with  ongoing shortness of breath.  The patient states that over the last few  days his shortness of breath has progressed.  The patient is usually  short of breath on exertion and states that today he really got severe  shortness of breath, for which he took his inhaler and nebulizer with no  relief.  The patient states that then he drove to the emergency room to  be evaluated.  The patient was seen multiple times this past year for  similar complaints.   PAST MEDICAL HISTORY:  Asthma, COPD, hypertension, congestive heart  failure, atrial fibrillation.   SOCIAL HISTORY:  He is a 1-pack-per-day smoker for over 40 years.  Still  currently smokes.  Does not drink any alcohol.  No illicit drug use.   ALLERGIES:  No known drug allergies.   CURRENT MEDICATIONS:  1. Lasix 20 mg p.r.n.  2. Diltiazem HCl 360 mg once a daily.  3. Toprol XL 25 mg daily.  4. Coumadin 4 mg specialized dosing.  5. Albuterol inhaler 2 puffs as needed.  6. Hydrocodone 5/500 mg as needed.  7. Penicillin V 500 mg 3 times daily.   FAMILY HISTORY:  Unremarkable.   PAST SURGICAL HISTORY:  Left tendon repair in his left arm and hernia  repair.   REVIEW OF SYSTEMS:  HEENT:  Unremarkable.  RESPIRATORY:  Positive for  shortness of breath and wheezing.  CARDIOVASCULAR:  No palpitations,  chest pain.  GENITOURINARY:  No complaints.  GASTROINTESTINAL:   No  nausea, vomiting or diarrhea.  NEUROLOGIC:  No dizziness,  lightheadedness.   PHYSICAL EXAMINATION:  VITAL SIGNS:  Temperature 97.5, pulse 83,  respirations 17, blood pressure 146/97, satting 92% on room air.  GENERAL:  This is a 70 year old African-American male, looks stated age.  Well-nourished, well-hydrated, well-developed.  Does not appear to be in  any acute distress.  Is pleasant and cooperative.  HEENT:  Head is atraumatic, normocephalic.  PERRLA.  EOMI.  NECK:  Soft, supple.  Nontender, nondistended.  CARDIOVASCULAR:  Regular rate and rhythm.  No murmurs appreciated.  LUNGS:  Decreased breath sounds. Wheezing is noted bilaterally.  No  rhonchi.  ABDOMEN:  Soft, nondistended, nontender.  Positive bowel sounds.  EXTREMITIES:  He has trace edema, bilateral lower extremities.  No  cyanosis, no clubbing.  NEUROLOGIC:  Cranial nerves II-XII are grossly intact.  The patient is  alert and oriented x 3.   LABORATORY DATA:  CK-MB is 4.0, troponin less  than 0.05.  BNP is 974.  PT 23.7, INR 2.0.  Sodium 138, potassium 3.8, chloride 96, CO2 30,  glucose 116, BUN 22, creatinine 1.45.  White count 7.3, hemoglobin 12.7,  hematocrit 40.2, platelets 230.  Chest x-ray shows stable cardiomegaly  with increase in mild interstitial edema, small effusions.  No focal  infiltrates.  Stable hilar prominence.  EKG in the emergency room showed  atrial fibrillation with RVR.   ASSESSMENT:  1. Shortness of breath.  2. Atrial fibrillation.  3. Congestive heart failure.  4. Chronic obstructive pulmonary disease.   PLAN:  The patient will be admitted to telemetry unit.  The patient will  have strict Is and Os and we will try to keep his sats greater than 90%.  The patient will be placed on a heart-healthy diet.  His IV will be  saline locked.  We will get a.m. labs, check his potassium, BNP, PT/INR,  CBC.  We will get a repeat EKG in the a.m.  The patient is on Coumadin.  We will continue him on  Coumadin per pharmacy protocol.  The patient  will be placed on GI prophylaxis.  For his CHF, we will give Lasix IV  daily.  We will place the patient on his Diltiazem and Toprol orally at  this time.  This may need to be increased or changed to IV if the  patient's heart rate stays elevated.  As for his nicotine dependence, we  will place the patient on a nicotine patch and get counseling as well as  counseling for his CHF.  Depending on the progress, the patient may need  a cardiology consult.  We will see how the patient does overnight.  If  he does not improve, we will get a cardiology consult at that time.      Skeet Latch, DO  Electronically Signed     SM/MEDQ  D:  04/04/2007  T:  04/04/2007  Job:  [id number]

## 2010-07-26 NOTE — Assessment & Plan Note (Signed)
[facility]                       [place] CARDIOLOGY OFFICE NOTE   Theodore [name]                     MRN:          [id number]  DATE:04/30/2008                            DOB:          November 04, 1940    CARDIOLOGIST:  Jonelle Sidle, MD   PRIMARY CARE PHYSICIAN:  Erle Crocker, MD   REASON FOR VISIT:  Post hospitalization followup.   HISTORY OF PRESENT ILLNESS:  Theodore [name] is a 70 year old male patient  with a history of permanent atrial fibrillation, chronic Coumadin  therapy that is followed by Dr. Jen Mow who was recently hospitalized at  [facility] with acute on chronic diastolic congestive heart  failure.  He missed his Lasix for a few days prior to developing volume  overload.  He also had questionable COPD exacerbation and was treated  with antibiotics.  He improved with restarting his diuretics.  He was  eventually discharged to home and follows up today.  He is doing well  without any significant shortness of breath.  He describes NYHA class  IIB symptoms.  He denies orthopnea or PND.  He does have pedal edema  that is fairly stable.  He denies any chest discomfort.  He denies  syncope.   CURRENT MEDICATIONS:  ProAir 2 puffs q.i.d. p.r.n., Metoprolol ER 25 mg  b.i.d., Spiriva daily, Warfarin as directed, Diltiazem ER 360 mg daily,  Potassium 20 mEq daily, Furosemide 40 mg daily,  Tessalon Perles p.r.n. nebulizer, Rozerem 8 mg at bedtime p.r.n.   PHYSICAL EXAMINATION:  GENERAL:  He is well-nourished, well-developed  male.  VITAL SIGNS:  Blood pressure is 118/62, pulse 72, and weight 182 pounds.  HEENT:  Normal.  NECK:  With minimal JVD.  CARDIAC:  Normal S1 and S2.  Regular rate and rhythm.  LUNGS:  Clear to auscultation bilaterally.  No wheezing.  No rales.  ABDOMEN:  Soft and nontender.  EXTREMITIES:  With 1+ pitting edema bilaterally.  NEUROLOGIC:  He is alert and oriented x3.  Cranial nerves II-XII grossly  intact.  SKIN:  Warm and dry.   ASSESSMENT AND PLAN:  1. Chronic diastolic congestive heart failure.  The patient had an      echocardiogram during his hospitalization February 2010, that      demonstrated an EF of 55% with mildly increased LV wall thickness.      He had mild-to-moderate mitral regurgitation, mild-to-moderate      tricuspid regurgitation, moderately increased peak pulmonary artery      systolic pressure, and mildly reduced RV function.  I suspect, he      has an element of right heart failure as well.  We will continue      his Lasix at the current dose.  We will check a BMET today.  I have      recommended lower extremity compression stockings to help him      further with his edema.  I talked to him today about reducing his      salt.  He seems to have some dietary indiscretion.  I have also      asked him to  weigh himself daily and to contact us if he gains 3      pounds or more in 1 day.  2. Permanent atrial fibrillation.  He is rate controlled.  His      Coumadin is followed by Dr. Jen Mow.  3. Hypertension.  This is well controlled.  4. Chronic obstructive pulmonary disease.  He continues on his      pulmonary medicines.  5. Insomnia.  He recently was given Rozerem by his primary care      physician.  He is requesting samples.  If we have samples of this,      we can provide him with 1 box.  If not, I have recommended he try      Benadryl over the counter to see if this helps with sleeping.  6. Leg pain.  The patient complained of leg pain in the hospital that      was somewhat concerning for claudication.  We will arrange ABIs in      followup of this.  If he has any significant signs of PAD, we can      refer him to one of the physicians in our group that treats this.      Otherwise, he can follow up as indicated.   DISPOSITION:  Follow up with me or Dr. Diona Browner in 6 months or sooner  p.r.n.      Tereso Newcomer, PA-C  Electronically Signed      Jonelle Sidle, MD  Electronically Signed   SW/MedQ  DD: 04/30/2008  DT: 05/01/2008  Job #: [id number]   cc:   Erle Crocker, M.D.

## 2010-07-26 NOTE — Assessment & Plan Note (Signed)
[facility]                       [place] CARDIOLOGY OFFICE NOTE   Theodore [name], Theodore [name]                     MRN:          [id number]  DATE:01/28/2007                            DOB:          24-Mar-1940    IDENTIFICATION:  Theodore [name] is a 70 year old gentleman last seen in  August of this year.  He has a history of atrial fibrillation and  presumed diastolic dysfunction.   Note, echocardiogram shows normal left ventricular function, moderate  LVH (March 2008).  When I saw him last, he had some wheezing.  I did  some blood work.  His BNP was mildly elevated at 591.  I told him to  increase his Lasix to 4 tablets (20 mg tablets) every day as needed.  It  turns out the patient backed down on his Lasix before I saw him the last  time to 20 to 40 mg daily (1-2 tablets).  He did this because he was  having right sided cramping.   In the interval, he has done about the same.  His breathing might be a  little bit better than last summer.   CURRENT MEDICATIONS:  1. ProAir 2 puffs q.i.d.  2. Metoprolol ER 25 b.i.d.  3. Lasix 20 or 40 mg daily.  4. Spiriva b.i.d.  5. Coumadin as directed.  6. Diltiazem 360 daily.   PHYSICAL EXAMINATION:  GENERAL:  The patient is in no distress.  VITAL SIGNS:  Blood pressure 140/64, pulse is 64 and irregular, weight  192 which is up from 189 on last check.  LUNGS:  Show mild wheezing throughout with some decreased air flow.  No  rales.  CARDIAC:  Irregularly irregular.  S1 S2.  No S3.  ABDOMEN:  Obese.  No hepatomegaly.  EXTREMITIES:  No significant edema.   IMPRESSION:  1. Dyspnea.  Again, he has significant chronic obstructive pulmonary      disease.  He said his inhalers make it a little hard for him to      sleep at night and I told him discuss with Dr. Jen Mow any      modification that can be made.  On exam, I am not convinced there      is any significant volume overload but I would like to check the  patient for B-MET, BNP today.  Note, I told him to drink liquids      with eating but not to over do it.  He may be drinking more fluid      than he actually needs and we are just chasing it with the Lasix.  2. Atrial fibrillation on Coumadin, rate controlled medicines.  Note,      a Holter monitor this past year average heart rate was under good      control.   All will set to see him back in the winter, sooner if problems develop.     Pricilla Riffle, MD, [facility]  Electronically Signed    PVR/MedQ  DD: 01/28/2007  DT: 01/28/2007  Job #: [id number]   cc:   Erle Crocker, M.D.

## 2010-07-26 NOTE — H&P (Signed)
NAMEJETSON, PICKREL NO.:  [id number]   MEDICAL RECORD NO.:  [id number]          PATIENT TYPE:  INP   LOCATION:  A331                          FACILITY:  [facility]   PHYSICIAN:  Skeet Latch, DO    DATE OF BIRTH:  Apr 06, 1940   DATE OF ADMISSION:  04/17/2008  DATE OF DISCHARGE:  LH                              HISTORY & PHYSICAL   PRIMARY CARE PHYSICIAN:  Erle Crocker, M.D.   CHIEF COMPLAINT:  Cough and shortness of breath.   HISTORY OF PRESENT ILLNESS:  This is a 70 year old African American male  who presents with a productive cough for approximately 3-4 weeks.  The  patient is also complaining of some chest soreness.  Denies fever,  chills, nausea, vomiting, abdominal pain.  The patient states the cough  started 3-4 weeks ago.  It is productive in nature.  The patient also  admits to not taking his medications regularly.  The patient was seen by  his primary care physician over the past week.  The patient does admit  to having some mild shortness of breath associated with the cough.  Upon  being seen in the emergency room, chest x-ray showed suspicion for  lingular infiltrate and also infiltrate at the right lung base being  suspicious for pneumonia.   PAST MEDICAL HISTORY:  1. COPD.  2. Asthma.  3. Hypertension.  4. Congestive heart failure.  5. Atrial fibrillation.   SOCIAL HISTORY:  He is a 1 pack a day smoker for 40 years.  Denies any  illicit drug use or alcohol abuse.   ALLERGIES:  NONE.   CURRENT HOME MEDICATIONS:  1. Spiriva daily.  2. Albuterol sulfate twice a day as needed.  3. Taztia XT 360 mg daily.  4. Warfarin 4 mg 2 pills daily except on Tuesday and Friday he takes 3      pills.  5. Klor-Con 20 mEq daily.  6. Toprol 25 mg twice a day.  7. Furosemide 40 mg daily.   FAMILY HISTORY:  Unremarkable.   PAST SURGICAL HISTORY:  1. Left tendon repair.  2. Left__________hernia repair.   REVIEW OF SYSTEMS:  RESPIRATORY:  Positive for  shortness of breath with  some wheezing.  HEENT:  Unremarkable.  CARDIOVASCULAR:  No chest pain,  palpitations.  GENITOURINARY:  Unremarkable.  GASTROINTESTINAL:  No  abdominal pain, nausea, vomiting or diarrhea.  NEUROLOGIC:  Dizziness  and lightheadedness.   PHYSICAL EXAMINATION:  VITAL SIGNS:  Temperature 98, pulse 54,  respirations 20, blood pressure 121/69, satting 94% on 2 liters.  GENERAL:  Well-nourished, well-developed, well-hydrated in no acute  distress.  HEENT:  Head is normocephalic, atraumatic.  Eyes:  PERRLA.  EOMI.  NECK:  Soft, supple, nontender and nondistended.  CARDIOVASCULAR:  Regular rate and rhythm.  No murmurs, rubs or gallops.  LUNGS:  He has decreased breath sounds, slight rales at the bases with  slight expiratory wheezing noted.  No rhonchi.  ABDOMEN:  Soft, nontender and nondistended.  Positive bowel sounds.  EXTREMITIES:  No clubbing, cyanosis, or edema.  NEUROLOGIC:  Cranial nerves II-XII are grossly intact.  The patient is  alert and oriented x3.   LABORATORY DATA:  BNP is 959, sodium 139, potassium 4.4, chloride 103,  CO2 30, glucose 102, BUN 13, creatinine 1.01, PTT 41, PT 15.8, INR 1.2,  CK-MB 4.3, troponin less than 0.05, myoglobin 321, white count 8.3,  hemoglobin 11.2, hematocrit 34.8, platelet count 410,000.   DIAGNOSTICS:  Chest x-ray showed suspicion for lingular infiltrate and  also with infiltrate at the right base.  Consider follow up chest  radiograph post-therapy to assess for clearing.   ASSESSMENT:  1. Probable pneumonia.  2. History of anemia.  3. History of congestive heart failure.  4. History of atrial fibrillation.  5. History of hypertension.   PLAN:  The patient will be admitted to the service of [facility].  For  his pneumonia, the patient will be placed on IV antibiotics and will  receive __________breathing treatments as well as oxygen to keep his  sats above or greater than 90%.  The patient will have incentive   spirometry also at bedside.  For his history of CHF, the patient's Lasix  will be changed over to IV Lasix.  We will get a repeat BNP in the a.m.  For his hypertension, the patient will be placed on his home  medications.  If his blood pressure seems to be stable, we will continue  to monitor closely.  For his COPD, the patient will be on nebulizing  treatments.  We will place the patient on IV Solu-Medrol at this time  and try to wean quickly as possible.  For his history of atrial  fibrillation, the patient is on Coumadin.  We will discharge with  Lovenox and have pharmacy dose his Coumadin.  Lastly, the patient will  be on GI prophylaxis at this time.      Skeet Latch, DO  Electronically Signed     SM/MEDQ  D:  04/18/2008  T:  04/18/2008  Job:  [id number]   cc:   Erle Crocker, M.D.

## 2010-07-26 NOTE — Assessment & Plan Note (Signed)
[place] [facility]                       [place] CARDIOLOGY OFFICE NOTE   Aquila, Delaughter NOBEL BRAR                     MRN:          [id number]  DATE:06/03/2007                            DOB:          January 25, 1941    PATIENT IDENTIFICATION:  [name] is a 70 year old gentleman who I saw  back in November of last year.  He has a history of atrial fibrillation,  presumed diastolic dysfunction and chronic dyspnea.  He has significant  COPD.   When I saw him last, some labs were checked as BNP was slightly elevated  at 611.  I recommended continuing his medical therapy.   Today he Says he has been doing fairly well.  He denies any chest  pressure.  Note, he was admitted in January for heart failure.  It looks  like he was in rapid atrial fibrillation on arrival.  He denies that his  breathing has been near that at all.   CURRENT MEDICINES:  1. ProAir two puffs q.i.d.  2. Metoprolol b.i.d.  3. ER b.i.d. 25 mg.  4. Spiriva b.i.d.  5. Coumadin as directed.  6. Diltiazem 360.  7. Potassium 20.  8. Lasix 40.  9. Loratadine 10.   PHYSICAL EXAMINATION:  GENERAL:  The patient is in no distress.  VITAL SIGNS:  Blood pressure is 126/70, pulse is 64, weight is 196 up 4  pounds from November.  LUNGS:  Relatively clear with some decreased air flow.  No wheezing  audible.  No rales.  CARDIAC:  Irregularly irregular.  No S3.  ABDOMEN:  Benign.  EXTREMITIES:  No significant edema.   IMPRESSION:  1. Congestive heart failure, again, felt to be secondary to diastolic      dysfunction, also in the setting of atrial fibrillation.  I would      check his labs today.  His status overall looks pretty good.  I      would not change anything.  He is watching his salt and fluid      intake more.  Continue on the same medical regimen.  2. Atrial fibrillation.  Again, as noted above.  Holter monitor done      this last summer showed      good rate control on average.  3.  I will set to see the patient, otherwise in July, sooner if      problems develop.     Pricilla Riffle, MD, [facility]  Electronically Signed    PVR/MedQ  DD: 06/03/2007  DT: 06/04/2007  Job #: [id number]   cc:   Erle Crocker, M.D.

## 2010-07-29 NOTE — Discharge Summary (Signed)
NAMETIGRAN, HAYNIE NO.:  [id number]   MEDICAL RECORD NO.:  [id number]          PATIENT TYPE:  INP   LOCATION:  A219                          FACILITY:  [facility]   PHYSICIAN:  Skeet Latch, DO    DATE OF BIRTH:  11-Mar-1941   DATE OF ADMISSION:  05/25/2006  DATE OF DISCHARGE:  LH                               DISCHARGE SUMMARY   ADDENDUM:  This is an addendum to the discharge summary on the patient,  05/31/2006.  Please in discharge medications take out __________  SR 180  mg p.o. daily.      Skeet Latch, DO  Electronically Signed     SM/MEDQ  D:  05/31/2006  T:  05/31/2006  Job:  [id number]

## 2010-07-29 NOTE — Group Therapy Note (Signed)
NAMEMUKHTAR, SHAMS NO.:  [id number]   MEDICAL RECORD NO.:  [id number]          PATIENT TYPE:  INP   LOCATION:  IC03                          FACILITY:  [facility]   PHYSICIAN:  Edward L. Juanetta Gosling, M.D.DATE OF BIRTH:  09-Nov-1940   DATE OF PROCEDURE:  DATE OF DISCHARGE:                                 PROGRESS NOTE   The patient of the hospitalist.  Mr. Buffalo is overall about the same.  He continues with high CO2 on BiPAP but I am virtually certain that his  CO2 is high, even at home, when he is doing well.   PHYSICAL EXAMINATION:  His exam otherwise shows that he is awake and  alert, he looks fairly comfortable.  His blood pressure is 132/80, pulse 100.  His chest is relatively clear.  His temp 36.1.   White count 9,500, hemoglobin 12.1, PT 17.5, pH 7.36, pCO2 of 67, pO2 of  81.  This was on BiPAP.   ASSESSMENT:  My assessment is that he is better.   PLAN:  Is to try to get him to see if he can tolerate being off the  BiPAP and recheck a blood gas in about an hour and see where he is.      Edward L. Juanetta Gosling, M.D.  Electronically Signed     ELH/MEDQ  D:  05/28/2006  T:  05/28/2006  Job:  [id number]

## 2010-07-29 NOTE — Group Therapy Note (Signed)
NAMEJAVANI, Theodore [name] NO.:  [id number]   MEDICAL RECORD NO.:  [id number]          PATIENT TYPE:  INP   LOCATION:  IC03                          FACILITY:  [facility]   PHYSICIAN:  Edward L. Juanetta Gosling, M.D.DATE OF BIRTH:  12/15/1940   DATE OF PROCEDURE:  DATE OF DISCHARGE:                                 PROGRESS NOTE   [name] is in the Intensive Care Unit.  He has severe COPD with  chronic and acute respiratory failure.  He also has congestive heart  failure, hypertension.  Today he says he is feeling a little better, he  is sleepy, he remains on BiPAP, his legs are still quite swollen.  His  heart rate is in the 80's, looks like atrial fib still.  His blood  pressure 123/87.  Chest a little clear, he still has some rhonchi and  wheezes.   LAB WORK:  Sodium was 135, potassium 4.3, chloride 97, CO2 of 32,  glucose 129, BUN 39, creatinine 1.33.  The SGOT is 43, albumin 2.8.  The  CBC shows white count is 8200, hemoglobin is 11.8.  Blood gas:  A pH  7.34, pCO2 of 63, pO2 of 81.   ASSESSMENT:  He is improving slowly, he looks better.  He still has, of  course, multiple medical problems and multiple difficulties.  I think we  should continue working with trying to get his, hopefully getting him  off of the BiPAP, continue with everything else and follow.      Edward L. Juanetta Gosling, M.D.  Electronically Signed     ELH/MEDQ  D:  05/27/2006  T:  05/27/2006  Job:  [id number]

## 2010-07-29 NOTE — Procedures (Signed)
NAMEJAHREE, Theodore [name] NO.:  [id number]   MEDICAL RECORD NO.:  [id number]          PATIENT TYPE:  INP   LOCATION:  IC03                          FACILITY:  [facility]   PHYSICIAN:  Gerrit Friends. Dietrich Pates, MD, FACCDATE OF BIRTH:  1940-11-01   DATE OF PROCEDURE:  05/28/2006  DATE OF DISCHARGE:                                ECHOCARDIOGRAM   CLINICAL DATA:  A 70 year old gentleman with congestive heart failure,  hypertension and dyspnea.  M-mode aorta 3.7, left atrium 4.4, septum  1.5, posterior wall 1.6, LV diastole 4.3, LV systole 3.5.   1. Technically adequate echocardiographic study.  2. Moderate left and right atrial enlargement.  3. Normal right ventricular size and wall thickness; moderately      impaired systolic function.  4. Normal mitral valve leaflets; moderate annular calcification with      very mild regurgitation.  5. Normal diameter of the proximal ascending aorta; mild to moderate      calcification of the wall and annulus.  6. Mild sclerosis of the aortic valve.  7. Normal tricuspid valve; very mild regurgitation; mild to moderate      elevation in estimated RV systolic pressure.  8. Normal pulmonic valve and proximal pulmonary artery.  9. Normal left ventricular size; moderate hypertrophy; low normal      overall LV systolic function.  10.Normal IVC.  11.Comparison with prior study of June 30, 2005:  No significant      interval change.      Gerrit Friends. Dietrich Pates, MD, [facility]  Electronically Signed     RMR/MEDQ  D:  05/29/2006  T:  05/29/2006  Job:  [id number]

## 2010-07-29 NOTE — Procedures (Signed)
NAMEAKING, KLABUNDE NO.:  [id number]   MEDICAL RECORD NO.:  [id number]          PATIENT TYPE:  OUT   LOCATION:  RAD                           FACILITY:  [facility]   PHYSICIAN:  Vineland Bing, M.D. [facility] OF BIRTH:  24-Mar-1940   DATE OF PROCEDURE:  06/30/2005  DATE OF DISCHARGE:                                  ECHOCARDIOGRAM   CLINICAL DATA:  70 year old gentleman with dyspnea, cardiomegaly and  hypertension. M-mode aorta 3.2, left atrium 5.2, septum 1.5, posterior wall  1.5, LV diastole 4.9, LV systole 3.6.   1.  Technically adequate echocardiographic study.  2.  Mild to moderate left atrial enlargement; moderate right atrial      enlargement. Normal right ventricular size with mildly depressed      function and mild hypertrophy.  3.  Normal trileaflet aortic valve.  4.  Normal diameter of the ascending aorta with mild calcification of the      wall.  5.  Normal mitral valve; moderate posterior annular calcification.  6.  Normal tricuspid valve; mild regurgitation; moderate elevation in      estimated RV systolic pressure.  7.  Normal pulmonic valve and proximal pulmonary artery.  8.  Normal left ventricular size; moderate concentric hypertrophy; regional      and global function are low normal.  9.  Normal IVC.  10. Comparison with prior study of January 27, 2003, left ventricular      hypertrophy is more impressive with perhaps slightly decreased left      ventricular systolic function. There has been a decrease in RV systolic      function and a decrease in the intensity of tricuspid regurgitation.       Bing, M.D. [facility]  Electronically Signed     RR/MEDQ  D:  06/30/2005  T:  07/01/2005  Job:  [id number]

## 2010-07-29 NOTE — Consult Note (Signed)
Theodore [name], Theodore [name] NO.:  [id number]   MEDICAL RECORD NO.:  [id number]          PATIENT TYPE:  INP   LOCATION:  IC03                          FACILITY:  [facility]   PHYSICIAN:  Gerrit Friends. Dietrich Pates, MD, FACCDATE OF BIRTH:  December 07, 1940   DATE OF CONSULTATION:  05/28/2006  DATE OF DISCHARGE:                                 CONSULTATION   PRIMARY CARE PHYSICIAN:  Dr. Jen Mow.   HISTORY OF PRESENT ILLNESS:  A 70 year old gentleman with a history of  atrial fibrillation and COPD admitted to the hospital with chronic and  acute respiratory failure.  Mr. Wafer does not recall ever having seen  a cardiologist, but he is quite vague regarding the details of his  medical history.  He has been anticoagulated as managed by Dr. Jen Mow for  chronic atrial fibrillation.  He has had previous echocardiograms in  2004 and 2007 revealing evidence for pulmonary hypertension and LVH with  preserved left ventricular systolic function.  There has been no  significant valvular disease except for tricuspid regurgitation.  Mr.  Doo has a longstanding history of cigarette smoking and COPD.  He  presented to the emergency department on May 25, 2006 with increasing  dyspnea at rest and possible orthopnea accompanied by increasing pedal  edema.  Initial evaluation was notable for a chest x-ray showing  vascular redistribution without pulmonary edema, mildly elevated cardiac  markers with troponin of 0.15 and CPK of 400-500 with a negative MB  fraction, normal renal function, normal CBC and a BNP level of 730.  He  was in atrial fibrillation with a rapid ventricular response and had  hypertension out of control.  With initial therapy including BiPAP, he  has improved to the point that he is now on nasal oxygen and feeling  relatively well.  He denies any chest discomfort.  He does not note  palpitations.  He has had no lightheadedness or syncope.   In the hospital, he has diuresed 4-5 liters on  moderate dose intravenous  Lasix.  Arterial blood gas has been consistent with a chronic  respiratory acidosis.  He has been afebrile.  The renal function has  tolerated diuresis well.   PAST MEDICAL HISTORY:  Notable for hypertension.   PAST SURGICAL HISTORY:  The patient had one outpatient surgery on his  left arm for a tendon problem approximately 10 years ago.   MEDICATIONS PRIOR TO ADMISSION:  1. Lisinopril 40 mg daily.  2. Prednisone 20 mg daily.  3. Levsin one daily.  4. Warfarin 5 mg daily.  5. Furosemide 20 mg daily.  6. Verapamil 180 mg daily.   FAMILY HISTORY:  Mother and father both died due to neoplastic disease.   ALLERGIES:  The patient has no known allergies.   SOCIAL HISTORY:  A 50 pack-year history of cigarette smoking; no  extensive alcohol.   REVIEW OF SYSTEMS:  Chronic dyspnea with mild exertion; vaccinations are  not up-to-date; all other systems reviewed and are negative.   PHYSICAL EXAMINATION:  GENERAL:  A pleasant gentleman in mild  respiratory distress.  VITAL SIGNS:  The  heart rate is 120 and irregular.  Blood pressure  150/80, respirations 24, afebrile.  HEENT:  Anicteric sclerae; normal lids and conjunctiva; normal oral  mucosa.  NECK:  No jugular venous distension; normal carotid upstrokes without  bruits.  ENDOCRINE:  No thyromegaly.  HEMATOPOIETIC:  No adenopathy.  LUNGS:  Early inspiratory rales throughout; markedly prolonged  expiratory phase with expiratory wheezing; moderate use of accessory  muscles.  CARDIAC:  Irregular rhythm; normal first and second heart sounds; no  third heart sound appreciated; normal PMI.  ABDOMEN:  Slightly taut and distended; nontender; normal bowel sounds;  no masses; no organomegaly.  EXTREMITIES:  2-3+ edema; distal pulses decreased, which may simply  reflect the presence of edema.  NEUROMUSCULAR:  Symmetric strength and tone; normal cranial nerves.  MUSCULOSKELETAL:  No joint deformities.  SKIN:  No  significant lesions; thinning of the skin over the lower  extremities.   ELECTROCARDIOGRAM:  On May 26, 2006, when the last tracing was  obtained, atrial fibrillation with a controlled ventricular response was  present.  Left anterior fascicular block.  Nonspecific T-wave  abnormality.  No prior tracing for comparison.   LABORATORY DATA:  Laboratory otherwise notable for a good lipid profile,  normal CBC, normal chemistry profile except for a slightly elevated  glucose, normal TSH, normal magnesium, mildly elevated total CPK with a  normal MB and slightly elevated troponin.  BNP was 730 on admission.  Arterial blood gas shows adequate oxygenation with supplemental oxygen,  moderately elevated CO2 and a normal pH.   CHEST X-RAY:  Cardiomegaly; vascular redistribution; no edema.   IMPRESSION:  Mr. Lopezgarcia presents with progressive respiratory problems,  likely predominantly related to chronic obstructive pulmonary disease.  He does have a component of pulmonary hypertension, probably  contributing to right-sided congestive heart failure.  He has responded  well to initial therapy with diuretics and bronchodilators, which should  be continued.  Warfarin requires adjustment, as does his rate control  medication and antihypertensives.  Repeat echocardiogram will be  obtained.  He does not appear to have any acute myocardial injury of  significance.  He has had nonsustained ventricular tachycardia on  telemetry, which does not require specific therapy.   We greatly appreciate the request for consultation and will be happy to  follow this nice gentleman with you both in hospital and subsequent to  discharge.      Gerrit Friends. Dietrich Pates, MD, [facility]  Electronically Signed     RMR/MEDQ  D:  05/28/2006  T:  05/28/2006  Job:  [id number]

## 2010-07-29 NOTE — H&P (Signed)
NAMEHOGAN, HOOBLER NO.:  [id number]   MEDICAL RECORD NO.:  [id number]          PATIENT TYPE:  INP   LOCATION:  IC03                          FACILITY:  [facility]   PHYSICIAN:  Skeet Latch, DO    DATE OF BIRTH:  1940/05/20   DATE OF ADMISSION:  05/25/2006  DATE OF DISCHARGE:  LH                              HISTORY & PHYSICAL   PRIMARY CARE PHYSICIAN:  Erle Crocker, M.D.   CHIEF COMPLAINT:  Shortness of breath, leg-swelling.   HISTORY OF PRESENT ILLNESS:  This is a 70 year old African-American  male, who presents to the emergency room, complaining of shortness of  breath.  This history is provided from ER notes, secondary to patient is  unable to give history due to his respiratory status.  Apparently,  patient has been short of breath for a few days.  Patient has had  multiple episodes of shortness of breath and it has persisted.  Patient  also apparently states that he has been having increased lower leg  edema.  States that the symptoms have become severe and they are  aggravated by lying flat, walking, exercising and other positions.  Patient also was complaining of some abdominal pain, cough that is  productive.  The patient did drive himself to the emergency room,  secondary to his wife's being ill.   PAST MEDICAL HISTORY:  Includes asthma, COPD and hypertension.   SOCIAL HISTORY:  Patient is a smoker, no known alcohol or illicit drug  use.   ALLERGIES:  No known drug allergies.   FAMILY HISTORY:  Unknown.   PAST SURGICAL HISTORY:  Unknown.   REVIEW OF SYSTEMS:  Unable to obtain.   CURRENT MEDICATIONS:  1. Lisinopril 40 mg daily.  2. Prednisone 20 mg once a day.  3. Levsin daily.  4. Coumadin 5 mg daily.  5. Lasix 20 mg daily.  6. Calan SR 180 mg once a day.   PHYSICAL EXAM:  VITAL SIGNS:  Temperature is 98.2, blood pressure  113/76, pulse 110s, respiratory rate in the 20s.  Patient was saturating  100%.  GENERALLY:  The patient is  alert and oriented, can respond to questions.  Currently, patient is not in acute distress, is breathing rapidly.  HEENT:  Head is atraumatic, normocephalic.  Eyes are PERLA, EOMI.  NECK:  Supple, no JVD appreciated.  CARDIOVASCULAR:  Irregularly irregular rhythm, tachycardic.  RESPIRATORY:  Increased breath sounds, slight rales appreciated.  ABDOMEN:  Soft, distended, nontender, positive bowel sounds.  EXTREMITIES:  Showed +3 pitting edema bilaterally, no clubbing, cyanosis  or edema.  NEUROLOGICALLY:  He is alert and oriented times three.  Cranial nerves  II through XII are grossly intact.   LABORATORIES:  First ABG with pH of 7.39, pCO2 of 49.7, pO2 is 109,  bicarb is 29.54.  Second ABG with pH 7.358, pCO2 53.2, bicarb 29.1.  White blood cell count 13.1, hemoglobin 13.1, hematocrit 41.8, platelets  are 208.  PT 15.3, INR 1.2.  Sodium 140, potassium 4.5, chloride 102,  CO2 32, glucose 152, BUN 24, creatinine 1.2.  AST is 44, ALT is 40.  Troponins less than 0.05.  BNP 730.   Patient did have an EKG which showed atrial fibrillation with rapid  ventricular response with premature aberrantly conducted complexes that  showed a left axis.  Rate was 160.   Chest x-ray showed cardiomegaly, possible pulmonary artery hypertension  and no acute pulmonary abnormalities noted.   ASSESSMENT:  1. Acute respiratory distress.  2. Hypertensive crisis.  3. Atrial fibrillation.  4. Tachycardia.  5. Emphysema.  6. Congestive heart failure.   PLAN:  1. For the patient's respiratory distress, the patient is currently on      BiPAP.  This will be maintained.  The ABG has been repeated.  Also      we will get another ABG in the a.m.  Patient will be sent to the      ICU for close monitoring.  We will try to keep his sats above 90%.      Patient will be on continuous pulse oximetry.  2. For his hypertensive crisis, the patient is currently on a      nitroglycerin drip.  This will be titrated per  protocol.  Patient      will be placed on his home medications, which include lisinopril,      and his Calan.  I will try to wean his nitroglycerin per protocol.  3. For his atrial fibrillation, the patient is currently on 5 mg of      Coumadin.  His INR is 1.2.  We will increase his Coumadin to 7.5      and get daily PT and INR.  Patient will also have a cardiology      consult regarding his atrial fibrillation.  For his tachycardia,      patient is currently on a Cardizem drip.  This also will be      continued per protocol and be weaned as needed.  4. For his edema, the patient has received approximately 80 mg of      Lasix.  Patient has had good urinary output.  I will continue him      in approximately 60 mg of IV Lasix and increase as needed,      monitoring his BUN and creatinine.  5. For his congestive heart failure, we will continue diuretics, as      stated above.  We will repeat a chest x-ray in the a.m. or possibly      get a CT of his chest.  6. Also, we will get an acute abdominal series.  Patient is      complaining of some abdominal discomfort at this time.  7. I will monitor patient closely and decide if patient may need to be      transferred to another facility.      Skeet Latch, DO  Electronically Signed     SM/MEDQ  D:  05/25/2006  T:  05/26/2006  Job:  [id number]   cc:   Erle Crocker, M.D.

## 2010-07-29 NOTE — Group Therapy Note (Signed)
Theodore [name], Theodore [name] NO.:  [id number]   MEDICAL RECORD NO.:  [id number]          PATIENT TYPE:  INP   LOCATION:  IC03                          FACILITY:  [facility]   PHYSICIAN:  Edward L. Juanetta Gosling, M.D.DATE OF BIRTH:  October 04, 1940   DATE OF PROCEDURE:  DATE OF DISCHARGE:                                 PROGRESS NOTE   SUBJECTIVE:  Mr. Gaal seems overall to be doing better.  He was able  to stay off of the BiPAP, and things are going fairly well.  He is awake  and alert and has no complaints.   PHYSICAL EXAMINATION:  GENERAL:  His physical examination this morning  shows he is awake and alert.  CHEST:  His chest still shows fairly marked bilateral rhonchi.  VITAL SIGNS:  His heart rate is in the 70s, still in atrial fibroid;  blood pressure 120 systolic.   His BMET this morning shows BUN 41, creatinine 1.08.  He is fully  anticoagulated with an INR of 2.6.  He did not have a chest x-ray or a  blood gas this morning which I think it is okay.  I do not think he  necessarily needs to have that checked today with him doing clinically  as well as he is.   ASSESSMENT:  He is much improved.  He has multiple problems including  congestive heart failure, chronic obstructive pulmonary disease, atrial  fibrillation, all of which are contributing to his shortness of breath.  I think he has chronic respiratory failure and expect that his partial  pressure of carbon dioxide probably runs in the high 50s to low 60s all  the time.      Edward L. Juanetta Gosling, M.D.  Electronically Signed     ELH/MEDQ  D:  05/29/2006  T:  05/29/2006  Job:  [id number]

## 2010-07-29 NOTE — Procedures (Signed)
NAME:  JOS, CYGAN NO.:  [id number]   MEDICAL RECORD NO.:  [id number]                   PATIENT TYPE:  OUT   LOCATION:  RAD                                  FACILITY:  [facility]   PHYSICIAN:  Vida Roller, M.D.                DATE OF BIRTH:  10/30/1940   DATE OF PROCEDURE:  01/27/2003  DATE OF DISCHARGE:                                  ECHOCARDIOGRAM   TAPE NUMBER:  [id number]   TAPE COUNT:  [id number]   CLINICAL INFORMATION:  [age]-year-old male with hypertension and  congestive heart failure.  Technical quality of the study is good.   PREVIOUS STUDY:  Attention is directed to an echocardiogram performed in  January 2001.   M-MODE:  AORTA:  34 mm  (<4.0)  LEFT ATRIUM:  45 mm  (<4.0)  SEPTUM:  15 mm  (0.7-1.1)  POSTERIOR WALL:  13 mm  (0.7-1.1)  LV-DIASTOLE:  47 mm  (<5.7)  LV-SYSTOLE:  29 mm  (<4.0)  E-SEPTAL:  @@  (<0.5)  RV-DIASTOLE:  @@  (<2.5)  IVC:  @@  (<2.0)   TWO-DIMENSIONAL AND DOPPLER IMAGING:  The left ventricle is normal size with  mild concentric left ventricular hypertrophy.  The overall systolic function  is normal with an estimated ejection fraction of 60-65%.  There is mildly  impaired diastolic function with a grade 1 relaxation abnormality as seen by  the transmitral and pulmonary venous Doppler profiles.   The right ventricle is mildly enlarged.  The overall free wall appears to  move normally.  The overall ejection fraction appears to be normal.   Both atria are enlarged, right significantly greater than left.  There is no  obvious atrial septal defect.   The aortic valve is mildly sclerotic with no evidence of stenosis or  regurgitation.   The mitral valve is morphologically unremarkable with mild annular  calcification.  There is mild insufficiency.  No stenosis is seen.   The tricuspid valve appears to have a dilated annulus.  There is moderate to  moderately severe regurgitation.  No stenosis is seen.   The  pulmonic valve was not well seen but appears to have trivial  insufficiency.   The pericardial structures appear normal.   The ascending aorta is not well seen.   The inferior vena cava appears to be normal size with normal respiratory  variation.  Estimated right ventricular systolic pressure is between 45 and  50 mmHg.      ___________________________________________                                            Vida Roller, M.D.   JH/MEDQ  D:  01/27/2003  T:  01/27/2003  Job:  [id number]

## 2010-07-29 NOTE — Discharge Summary (Signed)
NAMEKENNIS, WISSMANN NO.:  [id number]   MEDICAL RECORD NO.:  [id number]          PATIENT TYPE:  INP   LOCATION:  A219                          FACILITY:  [facility]   PHYSICIAN:  Skeet Latch, DO    DATE OF BIRTH:  09-18-1940   DATE OF ADMISSION:  05/25/2006  DATE OF DISCHARGE:  03/20/2008LH                               DISCHARGE SUMMARY   DISCHARGE DIAGNOSES:  1. Acute respiratory distress.  2. Hypertensive crisis.  3. Atrial fibrillation.  4. Tachycardia.  5. Emphysema.  6. Congestive heart failure.   HISTORY OF PRESENT ILLNESS:  [name] is a 70 year old African-  American male who presented to the emergency room complaining of  shortness of breath.  Apparently the patient had some shortness of  breath for a few days and has had multiple episodes, and also was  complaining of increased lower extremity edema.  The patient's symptoms  had become severe and they were aggravated by the patient lying flat,  walking, exercise or any positions.  The patient did have some slight  abdominal pain with cough that was productive on admission.  The patient  drove himself to the emergency room to be evaluated.   PAST MEDICAL HISTORY:  Asthma.  COPD.  Hypertension.   BRIEF HOSPITAL COURSE:  The patient was seen in the emergency room and  evaluated.  The patient had ABG that showed a pH of 7.39, pCO2 of 49,  pO2 109, bicarb 29.54.  The patient was in some acute distress--  respiratory.  The patient was placed on BiPAP and was continued on BiPAP  when I evaluated him initially.  EKG did show some atrial fibrillation  with RVR and his chest x-ray showed some cardiomegaly, pulmonary artery  hypertension but no acute abnormalities.  The patient was maintained on  BiPAP in the ER.  The patient was sent to the ICU to keep his sats above  90% and have continuous pulse oximetry.  The patient was also found to  be extremely hypertensive.  The patient was placed on a  nitroglycerin  drip.  For his atrial fibrillation the patient was currently on Coumadin  and was maintained on his Coumadin.  It was subtherapeutic, has been  adjusted during hospital stay.  A Cardiology consult was obtained.  The  patient's medications were adjusted.  The patient ws subsequently placed  on O2 via nasal cannula and sent to the general medical floor.  Prior to  this all his nitroglycerin drip and Cardizem drip were discontinued and  the patient was placed on oral medications.  The patient was continued  on diuretics for his edema and his edema has improved.  At this time the  patient's respiratory status has improved, edema also has improved and  his heart rate and cardiovascular status were also improved.  Patient  stable enough for discharge.  He was to be sent home on the following  medications.   DISCHARGE MEDICATIONS:  1. Lisinopril 40 mg p.o. daily.  2. Toprol 25 mg p.o. b.i.d.  3. Diltiazem 360 mg p.o. daily.  4. Lasix 60 mg  daily.  5. Levaquin 500 mg daily x5 days.  6. Calan-SR 180 mg p.o. daily.  7. Coumadin 4 mg daily.  8. Prednisone 20 mg p.o. daily.  9. Prevacid 30 mg p.o. daily.   DIET:  Maintain a low salt, 2-gram sodium, low fat diet.  Limit his  alcohol intake.   ACTIVITY:  He is to increase his activity slowly.   CONDITION ON DISCHARGE:  Stable.   DISPOSITION:  The patient will be discharged to home.   DISCHARGE INSTRUCTIONS:  1. He is to call his physician if he notes abnormal amount of weight      gain, increasing shortness of breath or swelling in the      extremities.  2. The patient is to follow up with Dr. Jen Mow in 1-2 weeks.  3. Follow up with [facility] in 1-2 weeks.      Skeet Latch, DO  Electronically Signed     SM/MEDQ  D:  05/31/2006  T:  05/31/2006  Job:  [id number]   cc:   Erle Crocker, M.D.   [facility]

## 2010-07-29 NOTE — Assessment & Plan Note (Signed)
[facility]                       [place] CARDIOLOGY OFFICE NOTE   Theodore [name], Theodore [name]                     MRN:          [id number]  DATE:06/18/2006                            DOB:          12/04/1940    IDENTIFICATION:  Theodore [name] is a 70 year old gentleman who we are  following post hospital.   The patient was admitted in March with acute respiratory distress,  hypertensive crisis, atrial fibrillation.  He was diuresed and treated  medically.   Since discharge, he has had some shortness of breath, but says it is  getting better.  He denies palpitations.  No chest pain.   CURRENT MEDICATIONS:  1. ProAir 2 puffs q.i.d.  2. Metoprolol ER 25 mg b.i.d.  3. Furosemide 20 mg daily? was 60 mg on discharge, paperwork.  4. Spiriva b.i.d.  5. Prevacid 30 mg daily.  6. Coumadin as directed.  7. Diltiazem ER 360 mg daily.  Note, the patient had also been on prednisone, which he is no longer on  and Levaquin.  Also lisinopril, which again is not on his current list  was on the discharge summary.   PAST MEDICAL HISTORY:  1. Hypertension.  2. Atrial fibrillation.  3. Emphysema.  4. CHF.  5. Asthma.   FAMILY HISTORY:  Mother and father had cancer, no CAD.   SOCIAL HISTORY:  A 50 pack-year history of tobacco, no extensive  alcohol.   REVIEW OF SYSTEMS:  All systems reviewed, negative to the above problem  except as noted.  Note, echo in 2007 is all I see with pulmonary  hypertension, LVH, normal LVEF.   PHYSICAL EXAMINATION:  On exam the patient is in no acute distress.  Blood pressure 120/78, pulse is 72, weight 182.  HEENT:  Normocephalic, atraumatic, EOMI, PERRL.  NECK:  No bruits, JVP is normal.  LUNGS:  Clear, some decreased airflow, no wheezes or rales.  CARDIAC:  Irregularly irregular S1, S2, no S3.  ABDOMEN:  Supple, nontender.  EXTREMITIES:  Trace to 1+ edema.   IMPRESSION:  1. Atrial fibrillation.  The patient is on rate control  and Coumadin.      At some time we would like to get a Holter monitor, evaluate 24-      hour control.  We will hold for now.  2. Congestive heart failure, most likely diastolic.  I will need to      get the copy of the echo recently done.  It was normal in the past.      I would check a BMET and a BNP today.  I have talked to him about      salt and watching the amount that he takes in.  Continue again      current medicines.   I would like to see him back in about 6-8 weeks.  Again, I will be in  touch with him regarding his blood work, but keep the regimen the same  for now.     Pricilla Riffle, MD, [facility]  Electronically Signed    PVR/MedQ  DD: 06/18/2006  DT: 06/19/2006  Job #: [id number]  cc:   Erle Crocker, M.D.

## 2010-07-29 NOTE — Group Therapy Note (Signed)
Theodore [name], KARGE NO.:  [id number]   MEDICAL RECORD NO.:  [id number]          PATIENT TYPE:  INP   LOCATION:  IC03                          FACILITY:  [facility]   PHYSICIAN:  Margaretmary Dys, M.D.DATE OF BIRTH:  Jan 05, 1941   DATE OF PROCEDURE:  DATE OF DISCHARGE:                                 PROGRESS NOTE   SUBJECTIVE:  The patient remains in the intensive care unit.  He had  BiPAP overnight.  He has acute on chronic respiratory failure due to  severe COPD exacerbation.  The patient feels a little better today.   OBJECTIVE:  Conscious, alert, comfortable, not in acute distress.  VITAL SIGNS:  Blood pressure 120/70, pulse was ranging between 80-92.  Temperature 98.3.  Saturation was 100% on 4 liters.  The patient was on  BiPAP of 16/7.  HEENT:  Normocephalic and atraumatic.  Oral mucosa was moist.  Normal  exudates.  NECK:  No supple.  No JVD.  No lymphadenopathy.  LUNGS:  Showed occasional crackles but was mostly clear.  HEART:  S1-S2 irregularly irregular.  ABDOMEN:  Soft, nontender.  Bowel sounds positive.  EXTREMITIES:  No edema.   LABORATORY/DIAGNOSTIC DATA:  Blood gas this morning showed a pH of  7.341, pCO2 was 62, pO2 of 81.4, bicarbonate of 30.  Saturation was  96.2%.  White blood cell count 8.2, hemoglobin of 11.8, hematocrit 37.5,  platelet count was 166.  PT 15.3, INR 1.25, PTT is 29.  Sodium 135,  potassium 4.3, chloride of 97.  CO2 32, glucose 129, BUN of 39,  creatinine 1.3.  Albumin was 2.8.  Cardiac enzymes were really not  impressive.   ASSESSMENT/PLAN:  Acute on chronic obstructive pulmonary disease  exacerbation with hypercapnic respiratory failure:  The patient is doing  a little bit better today.  Continue on antibiotic intravenous steroids  and BiPAP therapy.  Overall, the patient remains stable.  He has atrial  fibrillation.  Remains on a Cardizem drip.  We will try to start  switching him to oral in the morning.  All his other  home medications  have been continued.      Margaretmary Dys, M.D.  Electronically Signed     AM/MEDQ  D:  05/27/2006  T:  05/27/2006  Job:  [id number]

## 2010-07-29 NOTE — Group Therapy Note (Signed)
Theodore [name], IODICE NO.:  [id number]   MEDICAL RECORD NO.:  [id number]          PATIENT TYPE:  INP   LOCATION:  A219                          FACILITY:  [facility]   PHYSICIAN:  Edward L. Juanetta Gosling, M.D.DATE OF BIRTH:  02-Oct-1940   DATE OF PROCEDURE:  DATE OF DISCHARGE:                                 PROGRESS NOTE   SUBJECTIVE:  Mr. Gilson is doing much better.  He is awake and alert,  sitting up.  He has moved out of the intensive care unit and he says he  feels back to normal.  His physical exam shows his temperature is 97.9,  pulse 83, respirations 20, blood pressure 140/90, O2 sat is 96% on 2  liters.  His chest is clear.  His heart is regular.   ASSESSMENT:  He has what is clearly COPD and CHF.  He has chronic  respiratory failure and I am going to plan at this point to go ahead and  sign off.  I do not think he needs my services at this point but I will  of course be glad to see him at anytime at your request.      Ramon Dredge L. Juanetta Gosling, M.D.  Electronically Signed     ELH/MEDQ  D:  05/30/2006  T:  05/30/2006  Job:  [id number]

## 2010-07-29 NOTE — Consult Note (Signed)
NAMEROGERS, DITTER NO.:  [id number]   MEDICAL RECORD NO.:  [id number]          PATIENT TYPE:  INP   LOCATION:  IC03                          FACILITY:  [facility]   PHYSICIAN:  Edward L. Juanetta Gosling, M.D.DATE OF BIRTH:  Jun 10, 1940   DATE OF CONSULTATION:  DATE OF DISCHARGE:                                 CONSULTATION   REASON FOR CONSULTATION:  Respiratory failure.   HISTORY:  Mr. Fancher is a 70 year old with history of shortness of  breath.  He came to the emergency room on the 15th and was complaining  of abdominal discomfort and shortness of breath.  He says he has been  getting short of breath for the last several weeks.  He has had a lot of  swelling of his legs.  He said he had lower abdominal pain.  He has been  coughing and coughing up some sputum.  Earlier, during his  hospitalization he had what looked like a markedly reduced breath  sounds, etc. and he was started on BiPAP and improved.  He has limited  information about his past medical history but says he has a history of  hypertension.   MEDICATIONS:  He does not know of any history of COPD but he does use an  inhaler and he is on a large number of medications at home, including  lisinopril 40 mg daily, prednisone 20 mg daily, Levsin SL as needed,  Coumadin 5 mg daily, Lasix 20 mg daily, Calan SR 180 mg daily.   ALLERGIES:  He has no known drug allergies.   PAST MEDICAL HISTORY:  He does have the hypertension.   FAMILY HISTORY:  His family history he says both parents died of cancer  but he did not know the type.   SOCIAL HISTORY:  He has about a 60 pack-year smoking history.  He says  he is down to about four cigarettes a day.  I am uncertain of his  alcohol history.   REVIEW OF SYSTEMS:  Other than as mentioned, he has had a lot of leg  swelling and a cough.   PHYSICAL EXAMINATION:  GENERAL:  He appears to be in atrial  fibrillation, which I believe must be a chronic problem since he is on  Coumadin.  VITAL SIGNS:  His pulse rate is 70, blood pressure initially was about  180/104.  HEENT:  His pupils were reactive.  Mucous membranes are moist.  NECK:  Supple.  He does some have jugular venous distention.  CHEST:  Shows decreased breath sounds from wheezing.  HEART:  Irregular.  ABDOMEN:  Soft.  He really does not have any masses.  He has 2+ edema of  both legs and feet.   PERTINENT LABORATORY AND X-RAY DATA:  Chest x-ray shows cardiac  enlargement.  He had an echocardiogram done in 2004 which was said to  have a diagnosis of congestive heart failure.  It showed a normal left  ventricular size with concentric left ventricular hypertrophy.  Ejection  fraction 60-65%.  Impaired diastolic function.  The right ventricle was  enlarged with high right ventricular systolic  pressure.  Another  echocardiogram done in 2007 shows normal right ventricular size, mildly  depressed function and mild hypertrophy.  Left ventricular size was  normal.  Moderate concentric hypertrophy.  Compared to the echo of 2004,  the left ventricular hypertrophy was more impressive with slightly  decreased left ventricular systolic function.  Decrease in right  ventricular systolic function.  His chest x-ray shows cardiomegaly.  He  does have CO2 retention on his blood gas.  His prothrombin time is very  slightly elevated.   ASSESSMENT:  I think the problem is likely a combination of COPD and  probably some CHF.  He does not seem to have pneumonia.  Today his white  blood count is 11,800, hemoglobin 12.5, platelets 194.  BUN is 25,  creatinine 1.3.  Blood gas on 50% in BiPAP - CO2 is 195, pCO2 of 50, pH  7.36.  Cardiac panel shows some nonspecific elevation.  Magnesium and  phosphorous are normal.  His prothrombin time has slightly elevated,  15.7 and 1.2 INR.   PLAN:  Continue with his treatment including antibiotic __________ .  Continue Zithromax and Rocephin.  Continue with his Lasix at an   increased dose over what he had before.  Continue Prinivil.  Add  Protonix.  He is on prednisone orally.  He is on verapamil.  He is on  Coumadin.  Apparently he is on a Cardizem drip to control his atrial  fibrillation.  Concern, of course, is that he has a number of  intertwined problems and I suspect that what has happened now is that he  has an exacerbation of his COPD, which has made his other problems  worse.      Edward L. Juanetta Gosling, M.D.  Electronically Signed     ELH/MEDQ  D:  05/26/2006  T:  05/27/2006  Job:  [id number]

## 2010-08-04 ENCOUNTER — Other Ambulatory Visit: Payer: Self-pay | Admitting: Family Medicine

## 2010-08-05 ENCOUNTER — Other Ambulatory Visit: Payer: Self-pay

## 2010-08-05 DIAGNOSIS — R600 Localized edema: Secondary | ICD-10-CM

## 2010-08-05 MED ORDER — FUROSEMIDE 40 MG PO TABS: 40.0000 mg | ORAL_TABLET | Freq: Every day | ORAL | Status: DC

## 2010-08-05 MED ORDER — ALBUTEROL SULFATE HFA 108 (90 BASE) MCG/ACT IN AERS: 2.0000 | INHALATION_SPRAY | Freq: Four times a day (QID) | RESPIRATORY_TRACT | Status: DC | PRN

## 2010-08-15 ENCOUNTER — Ambulatory Visit (INDEPENDENT_AMBULATORY_CARE_PROVIDER_SITE_OTHER): Payer: Medicare Other | Admitting: *Deleted

## 2010-08-15 DIAGNOSIS — I4891 Unspecified atrial fibrillation: Secondary | ICD-10-CM

## 2010-08-15 DIAGNOSIS — Z7901 Long term (current) use of anticoagulants: Secondary | ICD-10-CM

## 2010-08-18 ENCOUNTER — Telehealth: Payer: Self-pay | Admitting: *Deleted

## 2010-08-19 NOTE — Telephone Encounter (Signed)
Asked pt to take either take  diltiazem or metoprolol in the evening over the weekend and call back on Monday if his symptoms are not resolved by alternating the medications

## 2010-08-22 ENCOUNTER — Encounter: Payer: Self-pay | Admitting: Family Medicine

## 2010-08-22 NOTE — Telephone Encounter (Signed)
Pt stated dizziness resolved with alternating bp medicaitons

## 2010-08-25 ENCOUNTER — Other Ambulatory Visit: Payer: Self-pay | Admitting: Family Medicine

## 2010-08-25 ENCOUNTER — Encounter: Payer: Self-pay | Admitting: Family Medicine

## 2010-08-28 ENCOUNTER — Other Ambulatory Visit: Payer: Self-pay | Admitting: Family Medicine

## 2010-08-29 ENCOUNTER — Emergency Department (HOSPITAL_COMMUNITY)
Admission: EM | Admit: 2010-08-29 | Discharge: 2010-08-29 | Disposition: A | Discharge status: Home / Self Care | Payer: Medicare Other | Attending: Emergency Medicine | Admitting: Emergency Medicine

## 2010-08-29 ENCOUNTER — Emergency Department (HOSPITAL_COMMUNITY): Payer: Medicare Other

## 2010-08-29 ENCOUNTER — Ambulatory Visit (INDEPENDENT_AMBULATORY_CARE_PROVIDER_SITE_OTHER): Payer: Medicare Other | Admitting: Family Medicine

## 2010-08-29 ENCOUNTER — Encounter: Payer: Self-pay | Admitting: Family Medicine

## 2010-08-29 VITALS — BP 160/80 | HR 66 | Resp 16 | Ht 67.5 in | Wt 168.0 lb

## 2010-08-29 DIAGNOSIS — F17200 Nicotine dependence, unspecified, uncomplicated: Secondary | ICD-10-CM | POA: Insufficient documentation

## 2010-08-29 DIAGNOSIS — I509 Heart failure, unspecified: Secondary | ICD-10-CM | POA: Insufficient documentation

## 2010-08-29 DIAGNOSIS — I4891 Unspecified atrial fibrillation: Secondary | ICD-10-CM

## 2010-08-29 DIAGNOSIS — J449 Chronic obstructive pulmonary disease, unspecified: Secondary | ICD-10-CM

## 2010-08-29 DIAGNOSIS — R609 Edema, unspecified: Secondary | ICD-10-CM

## 2010-08-29 DIAGNOSIS — R0602 Shortness of breath: Secondary | ICD-10-CM

## 2010-08-29 DIAGNOSIS — I10 Essential (primary) hypertension: Secondary | ICD-10-CM

## 2010-08-29 DIAGNOSIS — B029 Zoster without complications: Secondary | ICD-10-CM

## 2010-08-29 DIAGNOSIS — J4489 Other specified chronic obstructive pulmonary disease: Secondary | ICD-10-CM | POA: Insufficient documentation

## 2010-08-29 DIAGNOSIS — R600 Localized edema: Secondary | ICD-10-CM

## 2010-08-29 DIAGNOSIS — R109 Unspecified abdominal pain: Secondary | ICD-10-CM

## 2010-08-29 MED ORDER — FUROSEMIDE 40 MG PO TABS: 40.0000 mg | ORAL_TABLET | Freq: Every day | ORAL | Status: DC

## 2010-08-29 MED ORDER — POTASSIUM CHLORIDE CRYS ER 20 MEQ PO TBCR: EXTENDED_RELEASE_TABLET | ORAL | Status: DC

## 2010-08-29 NOTE — Progress Notes (Signed)
  Subjective:    Patient ID: Theodore [name], male    DOB: 05-23-1940, 70 y.o.   MRN: [id number]  HPI Seen in the Ed today, forabd pain , RUQ was dx with shingles. He will start med for this. Reports bloating , belching , and excessive belching, has had this prob for a while and actually this week was nauseated. Until his shingles is treated, I will need to hold off on gallbladder studies, last checked in 2008   Review of Systems Denies recent fever or chills. Denies sinus pressure, nasal congestion, ear pain or sore throat. Chronic shortness of breath with cough and sputum Denies chest pains, palpitations, paroxysmal nocturnal dyspnea, orthopnea and leg swelling Denies dysuria, frequency, hesitancy or incontinence. Denies joint pain, swelling and limitation in mobility. Denies headaches, seizure, numbness, or tingling. Denies depression, anxiety or insomnia.         Objective:   Physical Exam Patient alert and oriented and in no Cardiopulmonary distress.  HEENT: No facial asymmetry, EOMI, no sinus tenderness, TM's clear, Oropharynx pink and moist.  Neck supple no adenopathy.  Chest: decreased air entry , scattered and few wheezes and crackles CVS: S1, S2 no murmurs, no S3.  ABD: Soft non tender. Bowel sounds normal.  Ext: No edema  MS: decreased  ROM spine, shoulders, hips and knees.  Skin:active shingles rash on LUQ, just under breast, Psych: Good eye contact, normal affect. Memory intact not anxious or depressed appearing.  CNS: CN 2-12 intact, power, tone and sensation normal throughout.        Assessment & Plan:

## 2010-08-29 NOTE — Patient Instructions (Signed)
F/u in 4 months.  You do have shingles  You will be referred for a gall bladder US in the next 2 to 3 weeks.

## 2010-08-31 MED ORDER — ALBUTEROL SULFATE HFA 108 (90 BASE) MCG/ACT IN AERS: 2.0000 | INHALATION_SPRAY | Freq: Four times a day (QID) | RESPIRATORY_TRACT | Status: DC | PRN

## 2010-08-31 MED ORDER — WARFARIN SODIUM 4 MG PO TABS: 4.0000 mg | ORAL_TABLET | Freq: Every day | ORAL | Status: DC

## 2010-09-12 ENCOUNTER — Ambulatory Visit (INDEPENDENT_AMBULATORY_CARE_PROVIDER_SITE_OTHER): Payer: Medicare Other | Admitting: *Deleted

## 2010-09-12 DIAGNOSIS — I4891 Unspecified atrial fibrillation: Secondary | ICD-10-CM

## 2010-09-12 DIAGNOSIS — Z7901 Long term (current) use of anticoagulants: Secondary | ICD-10-CM

## 2010-09-12 LAB — POCT INR: INR: 3.2

## 2010-09-14 DIAGNOSIS — B029 Zoster without complications: Secondary | ICD-10-CM | POA: Insufficient documentation

## 2010-09-14 NOTE — Assessment & Plan Note (Signed)
Continues to deteriorate with nicotine use

## 2010-09-14 NOTE — Assessment & Plan Note (Signed)
Diagnosed on the day of the visit in the Ed, he is to start medication, and advised not to scratch the rash

## 2010-09-14 NOTE — Assessment & Plan Note (Signed)
Currently stable, pt encouraged to quit smoking so his condition will improve

## 2010-09-14 NOTE — Assessment & Plan Note (Signed)
Deteriorated, symptoms are suggestive of gall bladder disease, will refer for [place]

## 2010-09-14 NOTE — Assessment & Plan Note (Addendum)
Medication compliance addressed. Commitment to regular exercise and healthy  food choices, with portion control discussed. DASH diet and low fat diet discussed and literature offered. No Changes in medication made at this visit.  

## 2010-09-24 ENCOUNTER — Other Ambulatory Visit: Payer: Self-pay | Admitting: Family Medicine

## 2010-09-27 ENCOUNTER — Telehealth: Payer: Self-pay | Admitting: Family Medicine

## 2010-09-27 ENCOUNTER — Other Ambulatory Visit: Payer: Self-pay | Admitting: Family Medicine

## 2010-09-27 ENCOUNTER — Ambulatory Visit (HOSPITAL_COMMUNITY)
Admission: RE | Admit: 2010-09-27 | Discharge: 2010-09-27 | Disposition: A | Discharge status: Home / Self Care | Payer: Medicare Other | Source: Ambulatory Visit | Attending: Family Medicine | Admitting: Family Medicine

## 2010-09-27 DIAGNOSIS — R1013 Epigastric pain: Secondary | ICD-10-CM

## 2010-09-27 DIAGNOSIS — K3189 Other diseases of stomach and duodenum: Secondary | ICD-10-CM | POA: Insufficient documentation

## 2010-09-27 DIAGNOSIS — R109 Unspecified abdominal pain: Secondary | ICD-10-CM | POA: Insufficient documentation

## 2010-09-27 NOTE — Telephone Encounter (Signed)
Message copied by Diamantina Monks on Tue Sep 27, 2010  1:53 PM ------      Message from: Syliva Overman MD E      Created: Tue Sep 27, 2010 12:43 PM       pls advise he has no gallstones, I recommend another test to see if the gallbladder works normally, hIDA scan, let me know if he agrees so I can refer to scheduling

## 2010-09-27 NOTE — Telephone Encounter (Signed)
pls review this message, I see no documentration that pt wants the hida scan, but am entering basedon what you stated

## 2010-09-27 NOTE — Telephone Encounter (Signed)
He wants to know about the pain medicine

## 2010-09-27 NOTE — Telephone Encounter (Signed)
Patient will need an appt for this. No antibiotics without ov. If nothing available this week, will need to go to urgent care

## 2010-09-28 NOTE — Telephone Encounter (Signed)
It was attached to imaging result, routed to Dr Lodema Hong

## 2010-09-29 ENCOUNTER — Telehealth: Payer: Self-pay | Admitting: Family Medicine

## 2010-09-29 ENCOUNTER — Other Ambulatory Visit: Payer: Self-pay | Admitting: Family Medicine

## 2010-09-29 DIAGNOSIS — B0229 Other postherpetic nervous system involvement: Secondary | ICD-10-CM

## 2010-09-29 MED ORDER — GABAPENTIN 300 MG PO CAPS: 300.0000 mg | ORAL_CAPSULE | Freq: Two times a day (BID) | ORAL | Status: DC

## 2010-09-29 NOTE — Telephone Encounter (Signed)
Pt walked in c/o uncontrolled pain in LUQ from recent shingles , cannot sleep at night, will send in gabapentin 300mg  twice daily for 2 months total

## 2010-10-02 ENCOUNTER — Emergency Department (HOSPITAL_COMMUNITY)
Admission: EM | Admit: 2010-10-02 | Discharge: 2010-10-02 | Disposition: A | Discharge status: Home / Self Care | Payer: Medicare Other | Attending: Emergency Medicine | Admitting: Emergency Medicine

## 2010-10-02 ENCOUNTER — Emergency Department (HOSPITAL_COMMUNITY): Payer: Medicare Other

## 2010-10-02 ENCOUNTER — Encounter (HOSPITAL_COMMUNITY): Payer: Self-pay | Admitting: *Deleted

## 2010-10-02 ENCOUNTER — Other Ambulatory Visit: Payer: Self-pay

## 2010-10-02 DIAGNOSIS — R0602 Shortness of breath: Secondary | ICD-10-CM | POA: Insufficient documentation

## 2010-10-02 DIAGNOSIS — Z862 Personal history of diseases of the blood and blood-forming organs and certain disorders involving the immune mechanism: Secondary | ICD-10-CM | POA: Insufficient documentation

## 2010-10-02 DIAGNOSIS — J441 Chronic obstructive pulmonary disease with (acute) exacerbation: Secondary | ICD-10-CM | POA: Insufficient documentation

## 2010-10-02 DIAGNOSIS — I509 Heart failure, unspecified: Secondary | ICD-10-CM | POA: Insufficient documentation

## 2010-10-02 DIAGNOSIS — I4891 Unspecified atrial fibrillation: Secondary | ICD-10-CM | POA: Insufficient documentation

## 2010-10-02 DIAGNOSIS — B029 Zoster without complications: Secondary | ICD-10-CM | POA: Insufficient documentation

## 2010-10-02 DIAGNOSIS — F17200 Nicotine dependence, unspecified, uncomplicated: Secondary | ICD-10-CM | POA: Insufficient documentation

## 2010-10-02 DIAGNOSIS — Z7901 Long term (current) use of anticoagulants: Secondary | ICD-10-CM | POA: Insufficient documentation

## 2010-10-02 DIAGNOSIS — I10 Essential (primary) hypertension: Secondary | ICD-10-CM | POA: Insufficient documentation

## 2010-10-02 LAB — BLOOD GAS, ARTERIAL
Acid-Base Excess: 3.8 mmol/L — ABNORMAL HIGH (ref 0.0–2.0)
Bicarbonate: 29.1 mEq/L — ABNORMAL HIGH (ref 20.0–24.0)
O2 Saturation: 94 %
Patient temperature: 37
TCO2: 26.5 mmol/L (ref 0–100)

## 2010-10-02 LAB — CBC
Hemoglobin: 13.3 g/dL (ref 13.0–17.0)
RBC: 4.48 MIL/uL (ref 4.22–5.81)
WBC: 4.1 10*3/uL (ref 4.0–10.5)

## 2010-10-02 LAB — DIFFERENTIAL
Basophils Relative: 1 % (ref 0–1)
Lymphs Abs: 1.6 10*3/uL (ref 0.7–4.0)
Monocytes Relative: 17 % — ABNORMAL HIGH (ref 3–12)
Neutro Abs: 1.6 10*3/uL — ABNORMAL LOW (ref 1.7–7.7)
Neutrophils Relative %: 39 % — ABNORMAL LOW (ref 43–77)

## 2010-10-02 LAB — BASIC METABOLIC PANEL
BUN: 16 mg/dL (ref 6–23)
Chloride: 102 mEq/L (ref 96–112)
GFR calc Af Amer: >60 mL/min (ref >60)
Potassium: 4.5 mEq/L (ref 3.5–5.1)

## 2010-10-02 LAB — CARDIAC PANEL(CRET KIN+CKTOT+MB+TROPI)
Relative Index: 3.3 — ABNORMAL HIGH (ref 0.0–2.5)
Troponin I: <0.3 ng/mL (ref <0.30)

## 2010-10-02 MED ORDER — IPRATROPIUM BROMIDE 0.02 % IN SOLN
0.5000 mg | Freq: Once | RESPIRATORY_TRACT | Status: AC
  Administered 2010-10-02: 0.5 mg via RESPIRATORY_TRACT
  Filled 2010-10-02: qty 2.5

## 2010-10-02 MED ORDER — ALBUTEROL SULFATE (5 MG/ML) 0.5% IN NEBU
5.0000 mg | INHALATION_SOLUTION | Freq: Once | RESPIRATORY_TRACT | Status: AC
  Administered 2010-10-02: 5 mg via RESPIRATORY_TRACT
  Filled 2010-10-02: qty 1

## 2010-10-02 MED ORDER — ALBUTEROL SULFATE HFA 108 (90 BASE) MCG/ACT IN AERS
INHALATION_SPRAY | RESPIRATORY_TRACT | Status: AC
  Administered 2010-10-02: 2
  Filled 2010-10-02: qty 6.7

## 2010-10-02 MED ORDER — PREDNISONE 20 MG PO TABS: 20.0000 mg | ORAL_TABLET | Freq: Every day | ORAL | Status: AC

## 2010-10-02 MED ORDER — SODIUM CHLORIDE 0.9 % IJ SOLN
INTRAMUSCULAR | Status: AC
  Administered 2010-10-02: 10 mL via INTRAVENOUS
  Filled 2010-10-02: qty 10

## 2010-10-02 MED ORDER — IPRATROPIUM BROMIDE 0.02 % IN SOLN
RESPIRATORY_TRACT | Status: AC
  Filled 2010-10-02: qty 2.5

## 2010-10-02 MED ORDER — ALBUTEROL SULFATE HFA 108 (90 BASE) MCG/ACT IN AERS: 1.0000 | INHALATION_SPRAY | Freq: Four times a day (QID) | RESPIRATORY_TRACT | Status: DC | PRN

## 2010-10-02 NOTE — ED Notes (Signed)
Respiratory in with pt given treatment

## 2010-10-02 NOTE — ED Notes (Signed)
Increased sob x 1 wk, worse today with non-productive cough.  Denies chest pain.  Pt speaking short phrases in triage, tripoding.

## 2010-10-02 NOTE — ED Notes (Signed)
Pt states that he feels much better now. Pt resting comfortably on stretcher. Talking in full sentences.

## 2010-10-02 NOTE — ED Notes (Signed)
Pt states he is ready to go home

## 2010-10-02 NOTE — ED Notes (Signed)
Pt sitting up in bed no complaints.

## 2010-10-02 NOTE — ED Notes (Signed)
Provided pt with soft drink per nurse

## 2010-10-02 NOTE — ED Provider Notes (Signed)
History     Chief Complaint  Patient presents with  . Shortness of Breath   HPI Comments: Patient presents with gradually worsening shortness of breath over the course of the last week. He states that he has both COPD and congestive heart failure and continues to smoke cigarettes. He notes that he has had a slight cough productive of a clear phlegm. He denies fevers, swelling, nausea or vomiting, abdominal pain, chest pain. He has ran out of his albuterol inhaler approximately one week ago and thus has had no wheezing medicine since that time.  He is unsure of the last time that he required steroid treatment for his wheezing. He also notes that occasionally he will take a fluid pill which will improve his breathing difficulties. Currently his symptoms are moderate, worse with position and exertion. It is associated with coughing.  Patient is a 70 y.o. male presenting with shortness of breath. The history is provided by the patient and medical records.  Shortness of Breath  Associated symptoms include shortness of breath.    Past Medical History  Diagnosis Date  . Anemia     Hemoglobin 11.6 in 04/2008->resolved   . Edema     Lower Extremity  . Tobacco abuse   . Glaucoma   . Allergic rhinitis   . Atrial fibrillation     Coumadin;asymptomatic bradycardia on telemetry in 12/2009  . CHF (congestive heart failure)     H/o CHF with preserved LV EF; pulmonary hypertension; normal EF in 03/2009  . Hypertension   . Shingles     Past Surgical History  Procedure Date  . Bilateral cataract surgery   . Herniorrhapy   . Tendon repair     right hand surgical procedure for a tendon repair    Family History  Problem Relation Age of Onset  . Cancer Mother   . Cancer Father   . Coronary artery disease Brother     History  Substance Use Topics  . Smoking status: Current Everyday Smoker -- 0.5 packs/day    Types: Cigarettes  . Smokeless tobacco: Never Used  . Alcohol Use: No       Review of Systems  Respiratory: Positive for shortness of breath.   All other systems reviewed and are negative.    Physical Exam  BP 159/111  Pulse 87  Resp 30  Ht 5\' 6"  (1.676 m)  Wt 160 lb (72.576 kg)  BMI 25.82 kg/m2  SpO2 92%  Physical Exam  Constitutional: He appears well-developed and well-nourished.  HENT:  Head: Normocephalic and atraumatic.  Mouth/Throat: Oropharynx is clear and moist. No oropharyngeal exudate.  Eyes: Conjunctivae and EOM are normal. Pupils are equal, round, and reactive to light. Right eye exhibits no discharge. Left eye exhibits no discharge. No scleral icterus.  Neck: Normal range of motion. Neck supple. No JVD present. No thyromegaly present.  Cardiovascular: Normal rate, regular rhythm, normal heart sounds and intact distal pulses.  Exam reveals no gallop and no friction rub.   No murmur heard. Pulmonary/Chest: He has wheezes. He has rales.       Tachypnea, diffuse wheezing in all lung fields. Patient is able to speak in full sentences.  Abdominal: Soft. Bowel sounds are normal. He exhibits no distension and no mass. There is no tenderness.  Musculoskeletal: Normal range of motion. He exhibits no edema and no tenderness.  Lymphadenopathy:    He has no cervical adenopathy.  Neurological: He is alert. Coordination normal.  Skin: Skin is warm and  dry. No rash noted. He is not diaphoretic. No erythema.  Psychiatric: He has a normal mood and affect. His behavior is normal.    ED Course  Procedures  MDM Overall patient appears to be in mild respiratory distress with tachypnea and diffuse wheezing. With history of congestive heart failure this may be the source however given that he has run out of his beta agonist medication as of one week ago this is also a likely source. We'll obtain imaging of his chest to rule out pulmonary edema, and laboratory workup including a BNP, EKG which has been obtained and shows no ischemia, and treat with  nebulizer treatments for potential COPD exacerbation.  ED ECG REPORT   Date: 10/02/2010   Rate: 102  Rhythm: normal sinus rhythm and atrial fibrillation  QRS Axis: left  Intervals: atrial fibrillation  ST/T Wave abnormalities: normal  Conduction Disutrbances:none  Narrative Interpretation: afib, rate controlled, inferior Q waves  Old EKG Reviewed: none available  Patient has improved significantly and states that the albuterol is making so much better. Review of his lab work and chest x-ray shows that despite having a elevated BNP, he has no overt pulmonary edema. I suspect that his wheezing has come from being out of his albuterol for the last week. I will place him on prednisone for the next week. His expresses understanding to the plan.  Results for orders placed during the hospital encounter of 10/02/10  CBC      Component Value Range   WBC 4.1  4.0 - 10.5 (K/uL)   RBC 4.48  4.22 - 5.81 (MIL/uL)   Hemoglobin 13.3  13.0 - 17.0 (g/dL)   HCT 16.1  09.6 - 04.5 (%)   MCV 94.6  78.0 - 100.0 (fL)   MCH 29.7  26.0 - 34.0 (pg)   MCHC 31.4  30.0 - 36.0 (g/dL)   RDW 40.9  81.1 - 91.4 (%)   Platelets 184  150 - 400 (K/uL)  DIFFERENTIAL      Component Value Range   Neutrophils Relative 39 (*) 43 - 77 (%)   Neutro Abs 1.6 (*) 1.7 - 7.7 (K/uL)   Lymphocytes Relative 39  12 - 46 (%)   Lymphs Abs 1.6  0.7 - 4.0 (K/uL)   Monocytes Relative 17 (*) 3 - 12 (%)   Monocytes Absolute 0.7  0.1 - 1.0 (K/uL)   Eosinophils Relative 5  0 - 5 (%)   Eosinophils Absolute 0.2  0.0 - 0.7 (K/uL)   Basophils Relative 1  0 - 1 (%)   Basophils Absolute 0.1  0.0 - 0.1 (K/uL)  BASIC METABOLIC PANEL      Component Value Range   Sodium 139  135 - 145 (mEq/L)   Potassium 4.5  3.5 - 5.1 (mEq/L)   Chloride 102  96 - 112 (mEq/L)   CO2 33 (*) 19 - 32 (mEq/L)   Glucose, Bld 136 (*) 70 - 99 (mg/dL)   BUN 16  6 - 23 (mg/dL)   Creatinine, Ser 7.82  0.50 - 1.35 (mg/dL)   Calcium 9.4  8.4 - 95.6 (mg/dL)   GFR calc  non Af Amer >60  >60 (mL/min)   GFR calc Af Amer >60  >60 (mL/min)  CARDIAC PANEL(CRET KIN+CKTOT+MB+TROPI)      Component Value Range   Total CK 123  7 - 232 (U/L)   CK, MB 4.1 (*) 0.3 - 4.0 (ng/mL)   Troponin I <0.30  <0.30 (ng/mL)   Relative Index  3.3 (*) 0.0 - 2.5   PRO B NATRIURETIC PEPTIDE      Component Value Range   BNP, POC 5836.0 (*) 0 - 125 (pg/mL)  BLOOD GAS, ARTERIAL      Component Value Range   O2 Content, Ven 2.0     Delivery systems NASAL CANNULA     pH, Arterial 7.348 (*) 7.350 - 7.450    pCO2 54.3 (*) 35.0 - 45.0 (mmHg)   pO2, Arterial 73.5 (*) 80.0 - 100.0 (mmHg)   Bicarbonate 29.1 (*) 20.0 - 24.0 (mEq/L)   TCO2 26.5  0 - 100 (mmol/L)   Acid-Base Excess 3.8 (*) 0.0 - 2.0 (mmol/L)   O2 Saturation 94.0     Patient temperature 37.0     Collection site LEFT BRACHIAL     Drawn by COLLECTED BY RT     Sample type ARTERIAL     Allens test (pass/fail) NOT INDICATED (*) PASS   PROTIME-INR      Component Value Range   Prothrombin Time 26.5 (*) 11.6 - 15.2 (seconds)   INR 2.39 (*) 0.00 - 1.49     Dg Chest Portable 1 View  10/02/2010  *RADIOLOGY REPORT*  Clinical Data: Asthma exacerbation with shortness of breath.  PORTABLE CHEST - 1 VIEW 10/02/2010 1808 hours:  Comparison: Two-view chest x-ray 08/29/2010, 12/29/2009, and 06/25/2009.  Findings: Stable marked cardiomegaly.  Thoracic aorta mildly tortuous atherosclerotic, unchanged.  Hilar and mediastinal contours otherwise unremarkable.  Lungs clear.  Pulmonary vascularity normal.  No visible pleural effusions.  IMPRESSION: Stable marked cardiomegaly.  No acute cardiopulmonary disease.  Original Report Authenticated By: Arnell Sieving, M.D.           Vida Roller, MD 10/02/10 2219

## 2010-10-04 ENCOUNTER — Telehealth: Payer: Self-pay

## 2010-10-04 NOTE — Telephone Encounter (Signed)
Please advise 

## 2010-10-05 ENCOUNTER — Other Ambulatory Visit: Payer: Self-pay

## 2010-10-05 DIAGNOSIS — J449 Chronic obstructive pulmonary disease, unspecified: Secondary | ICD-10-CM

## 2010-10-05 MED ORDER — BUDESONIDE-FORMOTEROL FUMARATE 160-4.5 MCG/ACT IN AERO: 2.0000 | INHALATION_SPRAY | Freq: Two times a day (BID) | RESPIRATORY_TRACT | Status: DC

## 2010-10-05 MED ORDER — ALBUTEROL SULFATE HFA 108 (90 BASE) MCG/ACT IN AERS: 2.0000 | INHALATION_SPRAY | Freq: Two times a day (BID) | RESPIRATORY_TRACT | Status: DC

## 2010-10-05 NOTE — Telephone Encounter (Signed)
Patient aware and meds called into [facility]

## 2010-10-05 NOTE — Telephone Encounter (Signed)
Ok to change to 2 puffs four times daily, also let pt know he need to strt aditional inhaler since he is using the albuterol so often, advise pharmacy alo and erx symbicort 160/4.5 2 puffs twice daily,#1 refill 3, also encourage him to stop smoking tis is making his breathing worse

## 2010-10-10 ENCOUNTER — Ambulatory Visit (INDEPENDENT_AMBULATORY_CARE_PROVIDER_SITE_OTHER): Payer: Medicare Other | Admitting: *Deleted

## 2010-10-10 DIAGNOSIS — Z7901 Long term (current) use of anticoagulants: Secondary | ICD-10-CM

## 2010-10-10 DIAGNOSIS — I4891 Unspecified atrial fibrillation: Secondary | ICD-10-CM

## 2010-10-20 ENCOUNTER — Other Ambulatory Visit: Payer: Self-pay | Admitting: Family Medicine

## 2010-10-20 ENCOUNTER — Ambulatory Visit (INDEPENDENT_AMBULATORY_CARE_PROVIDER_SITE_OTHER): Payer: Medicare Other | Admitting: *Deleted

## 2010-10-20 DIAGNOSIS — I4891 Unspecified atrial fibrillation: Secondary | ICD-10-CM

## 2010-10-20 DIAGNOSIS — Z7901 Long term (current) use of anticoagulants: Secondary | ICD-10-CM

## 2010-10-20 LAB — POCT INR: INR: 2.4

## 2010-10-20 MED ORDER — WARFARIN SODIUM 4 MG PO TABS: ORAL_TABLET | ORAL | Status: DC

## 2010-11-03 ENCOUNTER — Encounter: Payer: Medicare Other | Admitting: *Deleted

## 2010-11-03 ENCOUNTER — Ambulatory Visit (INDEPENDENT_AMBULATORY_CARE_PROVIDER_SITE_OTHER): Payer: Medicare Other | Admitting: *Deleted

## 2010-11-03 DIAGNOSIS — Z7901 Long term (current) use of anticoagulants: Secondary | ICD-10-CM

## 2010-11-03 DIAGNOSIS — I4891 Unspecified atrial fibrillation: Secondary | ICD-10-CM

## 2010-11-03 LAB — POCT INR: INR: 3.2

## 2010-11-24 ENCOUNTER — Ambulatory Visit (INDEPENDENT_AMBULATORY_CARE_PROVIDER_SITE_OTHER): Payer: Medicare Other | Admitting: *Deleted

## 2010-11-24 DIAGNOSIS — I4891 Unspecified atrial fibrillation: Secondary | ICD-10-CM

## 2010-11-24 DIAGNOSIS — Z7901 Long term (current) use of anticoagulants: Secondary | ICD-10-CM

## 2010-11-24 LAB — POCT INR: INR: 2.6

## 2010-11-28 ENCOUNTER — Other Ambulatory Visit: Payer: Self-pay

## 2010-11-28 DIAGNOSIS — B0229 Other postherpetic nervous system involvement: Secondary | ICD-10-CM

## 2010-11-28 MED ORDER — GABAPENTIN 300 MG PO CAPS: 300.0000 mg | ORAL_CAPSULE | Freq: Two times a day (BID) | ORAL | Status: DC

## 2010-12-01 LAB — DIFFERENTIAL
Basophils Absolute: 0
Basophils Absolute: 0
Basophils Relative: 0
Eosinophils Absolute: 0.1
Eosinophils Relative: 0
Eosinophils Relative: 0
Eosinophils Relative: 2
Lymphocytes Relative: 12
Lymphocytes Relative: 4 — ABNORMAL LOW
Lymphs Abs: 0.4 — ABNORMAL LOW
Lymphs Abs: 0.5 — ABNORMAL LOW
Lymphs Abs: 1.1
Monocytes Absolute: 0.2
Monocytes Absolute: 0.4
Monocytes Absolute: 1.1 — ABNORMAL HIGH
Monocytes Relative: 15 — ABNORMAL HIGH
Monocytes Relative: 4
Neutro Abs: 10 — ABNORMAL HIGH
Neutro Abs: 3.7

## 2010-12-01 LAB — BASIC METABOLIC PANEL
BUN: 22
Calcium: 9.1
Calcium: 9.3
Calcium: 9.4
GFR calc Af Amer: 47 — ABNORMAL LOW
GFR calc non Af Amer: 39 — ABNORMAL LOW
GFR calc non Af Amer: 49 — ABNORMAL LOW
GFR calc non Af Amer: 55 — ABNORMAL LOW
Glucose, Bld: 116 — ABNORMAL HIGH
Glucose, Bld: 141 — ABNORMAL HIGH
Potassium: 4.2
Potassium: 4.3
Sodium: 136
Sodium: 137

## 2010-12-01 LAB — PROTIME-INR
INR: 2.1 — ABNORMAL HIGH
Prothrombin Time: 24.3 — ABNORMAL HIGH

## 2010-12-01 LAB — MAGNESIUM: Magnesium: 2

## 2010-12-01 LAB — CBC
HCT: 36.8 — ABNORMAL LOW
HCT: 38 — ABNORMAL LOW
Hemoglobin: 11.8 — ABNORMAL LOW
Hemoglobin: 12.1 — ABNORMAL LOW
Platelets: 230
Platelets: 234
RBC: 4.03 — ABNORMAL LOW
RDW: 14.1
RDW: 14.6
WBC: 10.9 — ABNORMAL HIGH
WBC: 4.4
WBC: 7.3

## 2010-12-01 LAB — LIPID PANEL
Cholesterol: 146
HDL: 29 — ABNORMAL LOW
Total CHOL/HDL Ratio: 5

## 2010-12-01 LAB — APTT: aPTT: 43 — ABNORMAL HIGH

## 2010-12-01 LAB — POCT CARDIAC MARKERS
CKMB, poc: 4
Myoglobin, poc: 220
Troponin i, poc: <0.05

## 2010-12-01 LAB — B-NATRIURETIC PEPTIDE (CONVERTED LAB): Pro B Natriuretic peptide (BNP): 350 — ABNORMAL HIGH

## 2010-12-07 ENCOUNTER — Telehealth: Payer: Self-pay | Admitting: *Deleted

## 2010-12-07 DIAGNOSIS — J449 Chronic obstructive pulmonary disease, unspecified: Secondary | ICD-10-CM

## 2010-12-07 MED ORDER — ALBUTEROL SULFATE HFA 108 (90 BASE) MCG/ACT IN AERS: 2.0000 | INHALATION_SPRAY | RESPIRATORY_TRACT | Status: DC | PRN

## 2010-12-07 NOTE — Telephone Encounter (Signed)
Please change to 2 puffs eveery 4 to 6 hours as needed for wheezing, send in new directions with 2 refills pls

## 2010-12-07 NOTE — Telephone Encounter (Signed)
MED CHANGE SENT TO PHARMACY

## 2010-12-12 LAB — CBC
MCHC: 32.1
MCV: 91.8
Platelets: 191

## 2010-12-12 LAB — DIFFERENTIAL
Basophils Relative: 0
Eosinophils Absolute: 0.1
Eosinophils Relative: 2
Monocytes Relative: 12
Neutrophils Relative %: 75

## 2010-12-12 LAB — BASIC METABOLIC PANEL
BUN: 6
CO2: 29
Chloride: 107
Creatinine, Ser: 0.98

## 2010-12-12 LAB — POCT CARDIAC MARKERS
CKMB, poc: 4
Myoglobin, poc: 179

## 2010-12-13 LAB — BASIC METABOLIC PANEL
BUN: 10
CO2: 28
GFR calc non Af Amer: >60
Glucose, Bld: 105 — ABNORMAL HIGH
Potassium: 4.3

## 2010-12-20 ENCOUNTER — Emergency Department (HOSPITAL_COMMUNITY): Payer: Medicare Other

## 2010-12-20 ENCOUNTER — Emergency Department (HOSPITAL_COMMUNITY)
Admission: EM | Admit: 2010-12-20 | Discharge: 2010-12-20 | Disposition: A | Discharge status: Home / Self Care | Payer: Medicare Other | Attending: Emergency Medicine | Admitting: Emergency Medicine

## 2010-12-20 ENCOUNTER — Encounter (HOSPITAL_COMMUNITY): Payer: Self-pay | Admitting: *Deleted

## 2010-12-20 DIAGNOSIS — I10 Essential (primary) hypertension: Secondary | ICD-10-CM

## 2010-12-20 DIAGNOSIS — Z79899 Other long term (current) drug therapy: Secondary | ICD-10-CM | POA: Insufficient documentation

## 2010-12-20 DIAGNOSIS — R0602 Shortness of breath: Secondary | ICD-10-CM | POA: Insufficient documentation

## 2010-12-20 DIAGNOSIS — F17200 Nicotine dependence, unspecified, uncomplicated: Secondary | ICD-10-CM | POA: Insufficient documentation

## 2010-12-20 DIAGNOSIS — J40 Bronchitis, not specified as acute or chronic: Secondary | ICD-10-CM | POA: Insufficient documentation

## 2010-12-20 DIAGNOSIS — R05 Cough: Secondary | ICD-10-CM | POA: Insufficient documentation

## 2010-12-20 DIAGNOSIS — R059 Cough, unspecified: Secondary | ICD-10-CM | POA: Insufficient documentation

## 2010-12-20 DIAGNOSIS — J3489 Other specified disorders of nose and nasal sinuses: Secondary | ICD-10-CM | POA: Insufficient documentation

## 2010-12-20 DIAGNOSIS — R062 Wheezing: Secondary | ICD-10-CM

## 2010-12-20 MED ORDER — PREDNISONE 20 MG PO TABS: 60.0000 mg | ORAL_TABLET | Freq: Every day | ORAL | Status: DC

## 2010-12-20 MED ORDER — ALBUTEROL SULFATE HFA 108 (90 BASE) MCG/ACT IN AERS: 2.0000 | INHALATION_SPRAY | RESPIRATORY_TRACT | Status: DC | PRN

## 2010-12-20 MED ORDER — IPRATROPIUM BROMIDE 0.02 % IN SOLN
0.5000 mg | Freq: Once | RESPIRATORY_TRACT | Status: AC
  Administered 2010-12-20: 0.5 mg via RESPIRATORY_TRACT
  Filled 2010-12-20: qty 2.5

## 2010-12-20 MED ORDER — AMOXICILLIN 500 MG PO CAPS: 500.0000 mg | ORAL_CAPSULE | Freq: Three times a day (TID) | ORAL | Status: DC

## 2010-12-20 MED ORDER — ALBUTEROL SULFATE (5 MG/ML) 0.5% IN NEBU
5.0000 mg | INHALATION_SOLUTION | Freq: Once | RESPIRATORY_TRACT | Status: AC
  Administered 2010-12-20: 5 mg via RESPIRATORY_TRACT
  Filled 2010-12-20: qty 1

## 2010-12-20 MED ORDER — PREDNISONE 20 MG PO TABS
60.0000 mg | ORAL_TABLET | Freq: Once | ORAL | Status: AC
  Administered 2010-12-20: 60 mg via ORAL
  Filled 2010-12-20: qty 3

## 2010-12-20 NOTE — ED Provider Notes (Addendum)
History   Chart scribed for Suzi Roots, MD by Enos Fling; the patient was seen in room APA06/[facility]06; this patient's care was started at 10:49 AM.    CSN: [id number] Arrival date & time: 12/20/2010 10:31 AM  Chief Complaint  Patient presents with  . Shortness of Breath   HPI Theodore [name] is a 70 y.o. male who presents to the Emergency Department complaining of shortness of breath. Pt states sob onset 2-3 days ago and has been worsening since with associated wheezing, runny nose, and productive cough. Pt has been using his inhaler with transient relief. SOB brought on by exertion. No fever, chills, diaphoresis, chest pain, or n/v/d. Pt is current smoker. States his next PCP appt is in 1 month.  PCP Dr. Lodema Hong  Past Medical History  Diagnosis Date  . Anemia     Hemoglobin 11.6 in 04/2008->resolved   . Edema     Lower Extremity  . Tobacco abuse   . Glaucoma   . Allergic rhinitis   . Atrial fibrillation     Coumadin;asymptomatic bradycardia on telemetry in 12/2009  . CHF (congestive heart failure)     H/o CHF with preserved LV EF; pulmonary hypertension; normal EF in 03/2009  . Hypertension   . Shingles     Past Surgical History  Procedure Date  . Bilateral cataract surgery   . Herniorrhapy   . Tendon repair     right hand surgical procedure for a tendon repair    Family History  Problem Relation Age of Onset  . Cancer Mother   . Cancer Father   . Coronary artery disease Brother     History  Substance Use Topics  . Smoking status: Current Everyday Smoker -- 0.5 packs/day    Types: Cigarettes  . Smokeless tobacco: Never Used  . Alcohol Use: No      Review of Systems  Constitutional: Negative for fever.  HENT: Positive for rhinorrhea.   Respiratory: Positive for cough, shortness of breath and wheezing.   Cardiovascular: Negative for chest pain.  Gastrointestinal: Negative for vomiting and diarrhea.  Genitourinary: Negative for flank pain.    Musculoskeletal: Negative for joint swelling.  Skin: Negative for rash.  Neurological: Negative for headaches.  Hematological:       Negative for jaundice    Allergies  Review of patient's allergies indicates no known allergies.  Home Medications   Current Outpatient Rx  Name Route Sig Dispense Refill  . ALBUTEROL SULFATE HFA 108 (90 BASE) MCG/ACT IN AERS Inhalation Inhale 2 puffs into the lungs every 4 (four) hours as needed for wheezing. 1 Inhaler 2  . ALBUTEROL SULFATE (2.5 MG/3ML) 0.083% IN NEBU       . BUDESONIDE-FORMOTEROL FUMARATE 160-4.5 MCG/ACT IN AERO Inhalation Inhale 2 puffs into the lungs 2 (two) times daily. 1 Inhaler 12  . DILTIAZEM HCL COATED BEADS 300 MG PO CP24 Oral Take 300 mg by mouth daily.      . FUROSEMIDE 40 MG PO TABS Oral Take 1 tablet (40 mg total) by mouth daily. 30 tablet 3  . GABAPENTIN 300 MG PO CAPS Oral Take 1 capsule (300 mg total) by mouth 2 (two) times daily. 60 capsule 2  . METOPROLOL SUCCINATE PO Oral Take 50 mg by mouth daily.     Marland Kitchen POTASSIUM CHLORIDE CRYS CR 20 MEQ PO TBCR Oral Take 10 mEq by mouth daily.      Marland Kitchen SPIRIVA HANDIHALER 18 MCG IN CAPS  INHALE CONTENTS OF 1 CAPSULE  USING HANDIHALER ONCE DAILY 30 capsule 2  . TRAVOPROST 0.004 % OP SOLN Both Eyes Place 1 drop into both eyes at bedtime.     . WARFARIN SODIUM 4 MG PO TABS  4-8 mg. Take 8 mg on Tuesday and Saturday, 4 mg on Sun, Mon,Wed,Thurs,Fri      . ALBUTEROL SULFATE HFA 108 (90 BASE) MCG/ACT IN AERS Inhalation Inhale 1-2 puffs into the lungs every 6 (six) hours as needed for wheezing. 1 Inhaler 0  . HYDROCODONE-ACETAMINOPHEN 5-325 MG PO TABS Oral Take 1 tablet by mouth every 6 (six) hours as needed. For pain      BP 183/111  Pulse 78  Temp(Src) 97.6 F (36.4 C) (Oral)  Resp 18  Ht 5\' 6"  (1.676 m)  Wt 150 lb (68.04 kg)  BMI 24.21 kg/m2  SpO2 94%  Physical Exam  Nursing note and vitals reviewed. Constitutional: He is oriented to person, place, and time. He appears  well-developed and well-nourished. No distress.  HENT:  Head: Normocephalic.  Mouth/Throat: Oropharynx is clear and moist and mucous membranes are normal.       Nasal congestion  Eyes: Conjunctivae are normal. Pupils are equal, round, and reactive to light.  Neck: Normal range of motion. Neck supple.  Cardiovascular: Normal rate, regular rhythm and intact distal pulses.  Exam reveals no gallop and no friction rub.   No murmur heard. Pulmonary/Chest: Effort normal. No respiratory distress. He has wheezes. He has no rales. He exhibits no tenderness.  Abdominal: Soft. There is no tenderness.  Musculoskeletal: Normal range of motion. He exhibits no edema.  Neurological: He is alert and oriented to person, place, and time.  Skin: Skin is warm and dry. No rash noted.  Psychiatric: He has a normal mood and affect.    ED Course  Procedures - none  Labs Reviewed - No data to display Dg Chest 2 View  12/20/2010  *RADIOLOGY REPORT*  Clinical Data: Cough and congestion.  Short of breath  CHEST - 2 VIEW  Comparison: 10/02/2010  Findings: Cardiac enlargement without heart failure.  Lungs are clear without infiltrate or effusion.  Underlying COPD.  IMPRESSION: Cardiac enlargement and COPD.  No acute cardiopulmonary disease.  Original Report Authenticated By: Camelia Phenes, M.D.     MDM  Albuterol and atrovent neb. Cxr. Recheck improved, but persistent wheezing. pred po. Alb neb. Recheck no increased wob. Pt w prod cough, yellow sputum, wheezing. Will rx abx, mdi, pred. rec smoking cessation.   IMPRESSION: No diagnosis found.  SCRIBE ATTESTATION: I personally performed the services described in this documentation, which was scribed in my presence. The recorded information has been reviewed and considered. Suzi Roots, MD       Suzi Roots, MD 12/20/10 1153  Suzi Roots, MD 12/20/10 1256

## 2010-12-20 NOTE — ED Notes (Signed)
Pt c/o sob. Pt states he became sob 4 days ago. Pt has productive cough and states he has been getting thick clear sputum up.

## 2010-12-21 LAB — PROTIME-INR: INR: 2.2 — ABNORMAL HIGH

## 2010-12-21 LAB — CBC
HCT: 35.8 — ABNORMAL LOW
MCHC: 32.6
MCV: 88.9
RBC: 4.02 — ABNORMAL LOW

## 2010-12-21 LAB — DIFFERENTIAL
Basophils Relative: 1
Eosinophils Absolute: 0.2
Eosinophils Relative: 4
Lymphs Abs: 1.1
Monocytes Absolute: 0.9 — ABNORMAL HIGH
Monocytes Relative: 18 — ABNORMAL HIGH
Neutrophils Relative %: 57

## 2010-12-22 ENCOUNTER — Encounter: Payer: Medicare Other | Admitting: *Deleted

## 2010-12-24 ENCOUNTER — Inpatient Hospital Stay (HOSPITAL_COMMUNITY)
Admission: EM | Admit: 2010-12-24 | Discharge: 2010-12-27 | DRG: 190 | Disposition: A | Discharge status: Home / Self Care | Payer: Medicare Other | Attending: Family Medicine | Admitting: Family Medicine

## 2010-12-24 ENCOUNTER — Other Ambulatory Visit: Payer: Self-pay

## 2010-12-24 ENCOUNTER — Emergency Department (HOSPITAL_COMMUNITY): Payer: Medicare Other

## 2010-12-24 ENCOUNTER — Encounter (HOSPITAL_COMMUNITY): Payer: Self-pay | Admitting: *Deleted

## 2010-12-24 DIAGNOSIS — M6530 Trigger finger, unspecified finger: Secondary | ICD-10-CM

## 2010-12-24 DIAGNOSIS — R0602 Shortness of breath: Secondary | ICD-10-CM | POA: Diagnosis present

## 2010-12-24 DIAGNOSIS — Z1211 Encounter for screening for malignant neoplasm of colon: Secondary | ICD-10-CM

## 2010-12-24 DIAGNOSIS — F528 Other sexual dysfunction not due to a substance or known physiological condition: Secondary | ICD-10-CM

## 2010-12-24 DIAGNOSIS — I5033 Acute on chronic diastolic (congestive) heart failure: Secondary | ICD-10-CM | POA: Diagnosis present

## 2010-12-24 DIAGNOSIS — Z9189 Other specified personal risk factors, not elsewhere classified: Secondary | ICD-10-CM

## 2010-12-24 DIAGNOSIS — G4700 Insomnia, unspecified: Secondary | ICD-10-CM

## 2010-12-24 DIAGNOSIS — B029 Zoster without complications: Secondary | ICD-10-CM

## 2010-12-24 DIAGNOSIS — R001 Bradycardia, unspecified: Secondary | ICD-10-CM | POA: Diagnosis not present

## 2010-12-24 DIAGNOSIS — R05 Cough: Secondary | ICD-10-CM

## 2010-12-24 DIAGNOSIS — K4090 Unilateral inguinal hernia, without obstruction or gangrene, not specified as recurrent: Secondary | ICD-10-CM

## 2010-12-24 DIAGNOSIS — Z79899 Other long term (current) drug therapy: Secondary | ICD-10-CM

## 2010-12-24 DIAGNOSIS — B0229 Other postherpetic nervous system involvement: Secondary | ICD-10-CM

## 2010-12-24 DIAGNOSIS — R609 Edema, unspecified: Secondary | ICD-10-CM

## 2010-12-24 DIAGNOSIS — I4891 Unspecified atrial fibrillation: Secondary | ICD-10-CM | POA: Diagnosis present

## 2010-12-24 DIAGNOSIS — Z23 Encounter for immunization: Secondary | ICD-10-CM

## 2010-12-24 DIAGNOSIS — R0902 Hypoxemia: Secondary | ICD-10-CM

## 2010-12-24 DIAGNOSIS — I509 Heart failure, unspecified: Secondary | ICD-10-CM | POA: Diagnosis present

## 2010-12-24 DIAGNOSIS — J441 Chronic obstructive pulmonary disease with (acute) exacerbation: Principal | ICD-10-CM

## 2010-12-24 DIAGNOSIS — I10 Essential (primary) hypertension: Secondary | ICD-10-CM | POA: Diagnosis present

## 2010-12-24 DIAGNOSIS — D649 Anemia, unspecified: Secondary | ICD-10-CM

## 2010-12-24 DIAGNOSIS — M545 Low back pain: Secondary | ICD-10-CM

## 2010-12-24 DIAGNOSIS — J449 Chronic obstructive pulmonary disease, unspecified: Secondary | ICD-10-CM

## 2010-12-24 DIAGNOSIS — J4489 Other specified chronic obstructive pulmonary disease: Secondary | ICD-10-CM | POA: Diagnosis present

## 2010-12-24 DIAGNOSIS — Z720 Tobacco use: Secondary | ICD-10-CM | POA: Diagnosis present

## 2010-12-24 DIAGNOSIS — R972 Elevated prostate specific antigen [PSA]: Secondary | ICD-10-CM

## 2010-12-24 DIAGNOSIS — M109 Gout, unspecified: Secondary | ICD-10-CM

## 2010-12-24 DIAGNOSIS — J309 Allergic rhinitis, unspecified: Secondary | ICD-10-CM

## 2010-12-24 DIAGNOSIS — F17200 Nicotine dependence, unspecified, uncomplicated: Secondary | ICD-10-CM | POA: Diagnosis present

## 2010-12-24 DIAGNOSIS — Z7901 Long term (current) use of anticoagulants: Secondary | ICD-10-CM

## 2010-12-24 DIAGNOSIS — I498 Other specified cardiac arrhythmias: Secondary | ICD-10-CM | POA: Diagnosis present

## 2010-12-24 DIAGNOSIS — R109 Unspecified abdominal pain: Secondary | ICD-10-CM

## 2010-12-24 HISTORY — DX: Shortness of breath: R06.02

## 2010-12-24 LAB — CBC
HCT: 42.2 % (ref 39.0–52.0)
MCH: 29.5 pg (ref 26.0–34.0)
MCV: 95 fL (ref 78.0–100.0)
Platelets: 295 10*3/uL (ref 150–400)
RDW: 14.2 % (ref 11.5–15.5)

## 2010-12-24 LAB — BASIC METABOLIC PANEL
Calcium: 10 mg/dL (ref 8.4–10.5)
Creatinine, Ser: 0.98 mg/dL (ref 0.50–1.35)
GFR calc non Af Amer: 81 mL/min — ABNORMAL LOW (ref >90)
Glucose, Bld: 103 mg/dL — ABNORMAL HIGH (ref 70–99)
Sodium: 140 mEq/L (ref 135–145)

## 2010-12-24 LAB — PROTIME-INR
INR: 2.18 — ABNORMAL HIGH (ref 0.00–1.49)
Prothrombin Time: 24.6 seconds — ABNORMAL HIGH (ref 11.6–15.2)

## 2010-12-24 LAB — DIFFERENTIAL
Basophils Absolute: 0 10*3/uL (ref 0.0–0.1)
Eosinophils Absolute: 0.1 10*3/uL (ref 0.0–0.7)
Eosinophils Relative: 1 % (ref 0–5)
Monocytes Absolute: 1 10*3/uL (ref 0.1–1.0)

## 2010-12-24 MED ORDER — ALBUTEROL SULFATE (5 MG/ML) 0.5% IN NEBU: 5.0000 mg | INHALATION_SOLUTION | RESPIRATORY_TRACT | Status: DC

## 2010-12-24 MED ORDER — IPRATROPIUM BROMIDE 0.02 % IN SOLN
0.5000 mg | Freq: Four times a day (QID) | RESPIRATORY_TRACT | Status: DC
  Administered 2010-12-24 – 2010-12-27 (×9): 0.5 mg via RESPIRATORY_TRACT
  Filled 2010-12-24 (×8): qty 2.5

## 2010-12-24 MED ORDER — ALBUTEROL SULFATE (5 MG/ML) 0.5% IN NEBU: 2.5000 mg | INHALATION_SOLUTION | RESPIRATORY_TRACT | Status: DC | PRN

## 2010-12-24 MED ORDER — SODIUM CHLORIDE 0.9 % IJ SOLN: 3.0000 mL | INTRAMUSCULAR | Status: DC | PRN

## 2010-12-24 MED ORDER — ONDANSETRON HCL 4 MG PO TABS: 4.0000 mg | ORAL_TABLET | Freq: Four times a day (QID) | ORAL | Status: DC | PRN

## 2010-12-24 MED ORDER — SODIUM CHLORIDE 0.9 % IJ SOLN
3.0000 mL | Freq: Two times a day (BID) | INTRAMUSCULAR | Status: DC
  Administered 2010-12-24 – 2010-12-27 (×6): 3 mL via INTRAVENOUS
  Filled 2010-12-24 (×4): qty 3

## 2010-12-24 MED ORDER — PREDNISONE 20 MG PO TABS
60.0000 mg | ORAL_TABLET | Freq: Once | ORAL | Status: AC
  Administered 2010-12-24: 60 mg via ORAL

## 2010-12-24 MED ORDER — WARFARIN SODIUM 2 MG PO TABS
4.0000 mg | ORAL_TABLET | Freq: Once | ORAL | Status: AC
  Administered 2010-12-25: 4 mg via ORAL
  Filled 2010-12-24: qty 2
  Filled 2010-12-24: qty 1

## 2010-12-24 MED ORDER — ALBUTEROL SULFATE (5 MG/ML) 0.5% IN NEBU
INHALATION_SOLUTION | RESPIRATORY_TRACT | Status: AC
  Filled 2010-12-24: qty 1

## 2010-12-24 MED ORDER — ALBUTEROL SULFATE (5 MG/ML) 0.5% IN NEBU
2.5000 mg | INHALATION_SOLUTION | Freq: Four times a day (QID) | RESPIRATORY_TRACT | Status: DC
  Administered 2010-12-24 – 2010-12-27 (×9): 2.5 mg via RESPIRATORY_TRACT
  Filled 2010-12-24 (×8): qty 0.5

## 2010-12-24 MED ORDER — ACETAMINOPHEN 650 MG RE SUPP: 650.0000 mg | Freq: Four times a day (QID) | RECTAL | Status: DC | PRN

## 2010-12-24 MED ORDER — METHYLPREDNISOLONE SODIUM SUCC 1000 MG IJ SOLR
80.0000 mg | Freq: Four times a day (QID) | INTRAMUSCULAR | Status: DC
  Filled 2010-12-24 (×8): qty 0.64

## 2010-12-24 MED ORDER — IPRATROPIUM BROMIDE 0.02 % IN SOLN: 0.5000 mg | RESPIRATORY_TRACT | Status: DC | PRN

## 2010-12-24 MED ORDER — POTASSIUM CHLORIDE CRYS ER 10 MEQ PO TBCR
10.0000 meq | EXTENDED_RELEASE_TABLET | Freq: Every day | ORAL | Status: DC
  Administered 2010-12-24 – 2010-12-27 (×4): 10 meq via ORAL
  Filled 2010-12-24 (×4): qty 1

## 2010-12-24 MED ORDER — GABAPENTIN 300 MG PO CAPS
300.0000 mg | ORAL_CAPSULE | Freq: Two times a day (BID) | ORAL | Status: DC
  Administered 2010-12-24 – 2010-12-27 (×6): 300 mg via ORAL
  Filled 2010-12-24 (×10): qty 1

## 2010-12-24 MED ORDER — METOPROLOL SUCCINATE ER 50 MG PO TB24
50.0000 mg | ORAL_TABLET | Freq: Every day | ORAL | Status: DC
  Administered 2010-12-24: 50 mg via ORAL
  Filled 2010-12-24 (×5): qty 1

## 2010-12-24 MED ORDER — FUROSEMIDE 10 MG/ML IJ SOLN
40.0000 mg | Freq: Two times a day (BID) | INTRAMUSCULAR | Status: DC
  Administered 2010-12-24 – 2010-12-26 (×4): 40 mg via INTRAVENOUS
  Filled 2010-12-24 (×4): qty 4

## 2010-12-24 MED ORDER — ALBUTEROL SULFATE (5 MG/ML) 0.5% IN NEBU
5.0000 mg | INHALATION_SOLUTION | Freq: Once | RESPIRATORY_TRACT | Status: AC
  Administered 2010-12-24: 5 mg via RESPIRATORY_TRACT

## 2010-12-24 MED ORDER — ONDANSETRON HCL 4 MG/2ML IJ SOLN: 4.0000 mg | Freq: Four times a day (QID) | INTRAMUSCULAR | Status: DC | PRN

## 2010-12-24 MED ORDER — METHYLPREDNISOLONE SODIUM SUCC 40 MG IJ SOLR
80.0000 mg | Freq: Four times a day (QID) | INTRAMUSCULAR | Status: DC
  Administered 2010-12-24 – 2010-12-25 (×2): 80 mg via INTRAVENOUS
  Filled 2010-12-24 (×2): qty 2

## 2010-12-24 MED ORDER — ACETAMINOPHEN 325 MG PO TABS: 650.0000 mg | ORAL_TABLET | Freq: Four times a day (QID) | ORAL | Status: DC | PRN

## 2010-12-24 MED ORDER — PREDNISONE 20 MG PO TABS
60.0000 mg | ORAL_TABLET | Freq: Every day | ORAL | Status: DC
  Filled 2010-12-24: qty 3

## 2010-12-24 MED ORDER — ALBUTEROL SULFATE (5 MG/ML) 0.5% IN NEBU
5.0000 mg | INHALATION_SOLUTION | Freq: Once | RESPIRATORY_TRACT | Status: AC
  Administered 2010-12-24: 5 mg via RESPIRATORY_TRACT
  Filled 2010-12-24: qty 1

## 2010-12-24 MED ORDER — DILTIAZEM HCL ER COATED BEADS 180 MG PO CP24
300.0000 mg | ORAL_CAPSULE | Freq: Every day | ORAL | Status: DC
  Administered 2010-12-24: 300 mg via ORAL
  Filled 2010-12-24 (×5): qty 1

## 2010-12-24 MED ORDER — IPRATROPIUM BROMIDE 0.02 % IN SOLN
0.5000 mg | Freq: Once | RESPIRATORY_TRACT | Status: AC
  Administered 2010-12-24: 0.5 mg via RESPIRATORY_TRACT
  Filled 2010-12-24: qty 2.5

## 2010-12-24 MED ORDER — TRAVOPROST 0.004 % OP SOLN
1.0000 [drp] | Freq: Every day | OPHTHALMIC | Status: DC
  Administered 2010-12-26: 1 [drp] via OPHTHALMIC
  Filled 2010-12-24: qty 0.1

## 2010-12-24 MED ORDER — SODIUM CHLORIDE 0.9 % IJ SOLN
3.0000 mL | INTRAMUSCULAR | Status: DC | PRN
  Filled 2010-12-24 (×4): qty 3

## 2010-12-24 MED ORDER — MOXIFLOXACIN HCL 400 MG PO TABS
400.0000 mg | ORAL_TABLET | Freq: Every day | ORAL | Status: DC
  Administered 2010-12-25 – 2010-12-26 (×2): 400 mg via ORAL
  Filled 2010-12-24 (×2): qty 1

## 2010-12-24 MED ORDER — FUROSEMIDE 10 MG/ML IJ SOLN
40.0000 mg | Freq: Once | INTRAMUSCULAR | Status: AC
  Administered 2010-12-24: 40 mg via INTRAVENOUS
  Filled 2010-12-24: qty 4

## 2010-12-24 NOTE — ED Notes (Signed)
Attempted to give report to floor. Nurse states she is in report at this time and will have to call back. Report given to Rivka Spring, RN.

## 2010-12-24 NOTE — ED Notes (Signed)
Attempted to call report, RN not available at this time, will call back.

## 2010-12-24 NOTE — ED Provider Notes (Signed)
History     CSN: [id number] Arrival date & time: 12/24/2010  1:05 PM  Chief Complaint  Patient presents with  . Shortness of Breath    (Consider location/radiation/quality/duration/timing/severity/associated sxs/prior treatment) HPI This 70 year old male has a history of COPD and heart failure who now presents with a one-week history of cough wheezing and shortness of breath. He finished a 4-5 day course of prednisone yesterday and has been using his albuterol inhaler with some improvement. Since he finished his prednisone yesterday his wheezing and shortness of breath is getting worse. He is able to speak short sentences with some shortness of breath at rest. He has had no fever no confusion no chest pain no abdominal pain. He had a negative chest x-ray already within a week. He is on doxycycline this week.  He does not want to be admitted. Past Medical History  Diagnosis Date  . Anemia     Hemoglobin 11.6 in 04/2008->resolved   . Edema     Lower Extremity  . Tobacco abuse   . Glaucoma   . Allergic rhinitis   . Atrial fibrillation     Coumadin;asymptomatic bradycardia on telemetry in 12/2009  . CHF (congestive heart failure)     H/o CHF with preserved LV EF; pulmonary hypertension; normal EF in 03/2009  . Hypertension   . Shingles   . Shortness of breath   . COPD (chronic obstructive pulmonary disease)     Past Surgical History  Procedure Date  . Bilateral cataract surgery   . Herniorrhapy   . Tendon repair     right hand surgical procedure for a tendon repair    Family History  Problem Relation Age of Onset  . Cancer Mother   . Cancer Father   . Coronary artery disease Brother     History  Substance Use Topics  . Smoking status: Current Everyday Smoker -- 0.5 packs/day for .5 years    Types: Cigarettes  . Smokeless tobacco: Never Used  . Alcohol Use: No      Review of Systems  Constitutional: Negative for fever.       10 Systems reviewed and are negative  for acute change except as noted in the HPI.  HENT: Negative for congestion.   Eyes: Negative for discharge and redness.  Respiratory: Positive for cough, shortness of breath and wheezing. Negative for chest tightness.   Cardiovascular: Positive for leg swelling. Negative for chest pain.       Chronic stable peripheral edema  Gastrointestinal: Negative for vomiting and abdominal pain.  Musculoskeletal: Negative for back pain.  Skin: Negative for rash.  Neurological: Negative for syncope, numbness and headaches.  Psychiatric/Behavioral:       No behavior change.    Allergies  Review of patient's allergies indicates no known allergies.  Home Medications   Current Outpatient Rx  Name Route Sig Dispense Refill  . ALBUTEROL SULFATE HFA 108 (90 BASE) MCG/ACT IN AERS Inhalation Inhale 2 puffs into the lungs every 4 (four) hours as needed for wheezing. 1 Inhaler 0  . ALBUTEROL SULFATE (2.5 MG/3ML) 0.083% IN NEBU Nebulization Take 2.5 mg by nebulization 3 (three) times daily as needed. For shortness of breath    . BUDESONIDE-FORMOTEROL FUMARATE 160-4.5 MCG/ACT IN AERO Inhalation Inhale 2 puffs into the lungs 2 (two) times daily. Needs a refill, please     . DILTIAZEM HCL COATED BEADS 300 MG PO CP24 Oral Take 300 mg by mouth daily.      . FUROSEMIDE 40 MG  PO TABS Oral Take 1 tablet (40 mg total) by mouth daily. 30 tablet 3  . GABAPENTIN 300 MG PO CAPS Oral Take 1 capsule (300 mg total) by mouth 2 (two) times daily. 60 capsule 2  . METOPROLOL SUCCINATE 50 MG PO TB24 Oral Take 50 mg by mouth daily.     Marland Kitchen POTASSIUM CHLORIDE CRYS CR 20 MEQ PO TBCR Oral Take 10 mEq by mouth daily.      . TRAVOPROST 0.004 % OP SOLN Both Eyes Place 1 drop into both eyes at bedtime.     . WARFARIN SODIUM 4 MG PO TABS  4-8 mg. Take 8 mg on Tuesday and Saturday, 4 mg on Sun, Mon,Wed,Thurs,Fri      . IPRATROPIUM BROMIDE 0.02 % IN SOLN Nebulization Take 2.5 mLs (500 mcg total) by nebulization every 4 (four) hours as  needed for wheezing. 75 mL 1  . MOXIFLOXACIN HCL 400 MG PO TABS Oral Take 1 tablet (400 mg total) by mouth daily at 6 PM. 5 tablet 0  . PREDNISONE 50 MG PO TABS  Prednisone 40 mg po daily x  1 day  Then Prednisone 30 mg po daily x 1 day then Prednisone 20 mg po daily x 1 day then Prednisone 10 mg po daily x 1day then Prednisone 10 mg po daily x 1 day Prednisone 10 mg po daily x 1 day then stop... 4 tablet 0  . TIOTROPIUM BROMIDE MONOHYDRATE 18 MCG IN CAPS         BP 140/78  Pulse 77  Temp(Src) 98.3 F (36.8 C) (Oral)  Resp 20  Ht 5\' 6"  (1.676 m)  Wt 153 lb 7 oz (69.6 kg)  BMI 24.77 kg/m2  SpO2 94%  Physical Exam  Nursing note and vitals reviewed. Constitutional:       Awake, alert, nontoxic appearance.  HENT:  Head: Atraumatic.  Eyes: Right eye exhibits no discharge. Left eye exhibits no discharge.  Neck: Neck supple.  Cardiovascular:  No murmur heard.      Irregularly irregular rate and rhythm with history of chronic atrial fibrillation  Pulmonary/Chest: He has wheezes. He has no rales. He exhibits no tenderness.       No rales or rhonchi but expiratory wheezes are present with mild to moderate respiratory distress at rest the patient is able to speak short sentences with normal room-air pulse oximetry 92%  Abdominal: Soft. Bowel sounds are normal. There is no tenderness. There is no rebound.  Musculoskeletal: He exhibits edema. He exhibits no tenderness.       Baseline ROM, no obvious new focal weakness.  Chronic stasis dermatitis with mild edema to both legs at baseline.  Neurological:       Mental status and motor strength appears baseline for patient and situation.  Skin: No rash noted.  Psychiatric: He has a normal mood and affect.    ED Course  Procedures (including critical care time)  10:07 PM the patient wanted to go home but we stood him up he watches to couple of feet and his pulse oximetry in room air dropped to 85% and he was markedly short of breath and  generally weak so he sat back down and did and was convinced he does actually need to stay for admission due to his hypoxia and he agrees.  Labs are ordered and when they resulted hospitalists will be called.  ECG: Atrial fibrillation with premature ventricular complexes with ventricular rate 94 beats per minute, left axis deviation, no P  waves present, QRS duration is normal, inferior Q waves present, nonspecific ST segment changes present, no significant change from July 2012 Labs Reviewed  BASIC METABOLIC PANEL - Abnormal; Notable for the following:    CO2 37 (*)    Glucose, Bld 103 (*)    BUN 35 (*)    GFR calc non Af Amer 81 (*)    All other components within normal limits  PROTIME-INR - Abnormal; Notable for the following:    Prothrombin Time 24.6 (*)    INR 2.18 (*)    All other components within normal limits  PRO B NATRIURETIC PEPTIDE - Abnormal; Notable for the following:    BNP, POC 21159.0 (*)    All other components within normal limits  CBC - Abnormal; Notable for the following:    RBC 4.14 (*)    Hemoglobin 12.0 (*)    All other components within normal limits  BASIC METABOLIC PANEL - Abnormal; Notable for the following:    Chloride 95 (*)    CO2 40 (*)    Glucose, Bld 187 (*)    BUN 29 (*)    GFR calc non Af Amer 83 (*)    All other components within normal limits  PRO B NATRIURETIC PEPTIDE - Abnormal; Notable for the following:    BNP, POC 13417.0 (*)    All other components within normal limits  PROTIME-INR - Abnormal; Notable for the following:    Prothrombin Time 22.9 (*)    INR 1.99 (*)    All other components within normal limits  BASIC METABOLIC PANEL - Abnormal; Notable for the following:    Chloride 92 (*)    CO2 39 (*)    Glucose, Bld 125 (*)    BUN 27 (*)    GFR calc non Af Amer 85 (*)    All other components within normal limits  PRO B NATRIURETIC PEPTIDE - Abnormal; Notable for the following:    BNP, POC 3456.0 (*)    All other components  within normal limits  BASIC METABOLIC PANEL - Abnormal; Notable for the following:    Chloride 91 (*)    CO2 37 (*)    Glucose, Bld 146 (*)    BUN 29 (*)    GFR calc non Af Amer 80 (*)    All other components within normal limits  CBC  DIFFERENTIAL  TROPONIN I  MAGNESIUM   No results found.   1. COPD exacerbation   2. Hypoxia   3. Atrial fibrillation   4. Post herpetic neuralgia   5. C H F   6. COPD   7. DYSPNEA   8. Tobacco abuse disorder   9. Bradycardia   10. GOUT   11. ANEMIA-NOS   12. ERECTILE DYSFUNCTION   13. HYPERTENSION   14. ALLERGIC RHINITIS   15. CHRONIC OBSTRUCTIVE PULMONARY DISEASE, ACUTE EXACERBATION   16. INGUINAL HERNIA, LEFT   17. LOW BACK PAIN, ACUTE   18. TRIGGER FINGER   19. INSOMNIA   20. EDEMA LEG   21. Cough   22. ABDOMINAL PAIN   23. NOT TAKING MEDICATION   24. ELEVATED PROSTATE SPECIFIC ANTIGEN   25. Encounter for long-term (current) use of other medications   26. Special screening for malignant neoplasms, colon   27. Need for prophylactic vaccination and inoculation against influenza   28. Long term current use of anticoagulant   29. Shingles       MDM  Patient / Family / Caregiver informed  of clinical course, understand medical decision-making process, and agree with plan.        Hurman Horn, MD 12/27/10 [phone]

## 2010-12-24 NOTE — ED Notes (Signed)
Hospitalist currently at bedside

## 2010-12-24 NOTE — ED Notes (Signed)
Attempted to ambulate pt in hallway(apprx 10 ft). Pt O2 sat dropped to 85 % and pt c/o weakness and increasing sob. Pt returned to room and sat on side of bed. edp at bedisde.

## 2010-12-24 NOTE — ED Notes (Signed)
Pt c/o shortness of breath, dizziness and wheezing since Monday. Seen in ED this week but is not feeling any better.

## 2010-12-24 NOTE — H&P (Signed)
PCP:   Syliva Overman, MD, MD   Chief Complaint:  Wheezing, shortness of breath, dizziness  HPI: Theodore [name] is a 70 year old African American gentleman with prior history of diastolic CHF, COPD, tobacco use, chronic A. fib on Coumadin reports that he was in his usual state of health up until a week ago. He started experiencing minimally productive cough associated with increasing shortness of breath and wheezing about a week ago for which he's tried using his inhalers at home. Then came to the ER on 10/9 for unremarkable chest x-ray, was treated with nebulizers and subsequently sent home on amoxicillin and prednisone taper. Patient reports that he felt a little better with the above medications but then started feeling worse again over the last 2 days and decided to come to the ER with worsening shortness of breath, wheezing and weakness. Denies any fevers, reports chills off and on for the last 4 days. He was treated with 60 mg of prednisone and nebulizer treatments with a plan to initially go home, but desatted into the 80s when he ambulated and hence [facility] were consulted for admission and management  Allergies:  No Known Allergies    Past Medical History  Diagnosis Date  . Anemia     Hemoglobin 11.6 in 04/2008->resolved   . Edema     Lower Extremity  . Tobacco abuse   . Glaucoma   . Allergic rhinitis   . Atrial fibrillation     Coumadin;asymptomatic bradycardia on telemetry in 12/2009  . CHF (congestive heart failure)     H/o CHF with preserved LV EF; pulmonary hypertension; normal EF in 03/2009  . Hypertension   . Shingles   . Shortness of breath   . COPD (chronic obstructive pulmonary disease)     Past Surgical History  Procedure Date  . Bilateral cataract surgery   . Herniorrhapy   . Tendon repair     right hand surgical procedure for a tendon repair    Prior to Admission medications   Medication Sig Start Date End Date Taking? Authorizing Provider    albuterol (PROAIR HFA) 108 (90 BASE) MCG/ACT inhaler Inhale 2 puffs into the lungs every 4 (four) hours as needed for wheezing. 12/07/10  Yes Syliva Overman, MD  albuterol (PROVENTIL HFA;VENTOLIN HFA) 108 (90 BASE) MCG/ACT inhaler Inhale 2 puffs into the lungs every 4 (four) hours as needed for wheezing. 12/20/10 12/20/11 Yes Suzi Roots, MD  albuterol (PROVENTIL) (2.5 MG/3ML) 0.083% nebulizer solution Take 2.5 mg by nebulization 3 (three) times daily as needed. For shortness of breath 10/20/10  Yes Syliva Overman, MD  amoxicillin (AMOXIL) 500 MG capsule Take 1 capsule (500 mg total) by mouth 3 (three) times daily. One (1) po tid x 10 days 12/20/10 12/30/10 Yes Suzi Roots, MD  budesonide-formoterol [facility]) 160-4.5 MCG/ACT inhaler Inhale 2 puffs into the lungs 2 (two) times daily. Needs a refill, please  10/05/10 10/05/11 Yes Syliva Overman, MD  diltiazem (CARTIA XT) 300 MG 24 hr capsule Take 300 mg by mouth daily.     Yes Historical Provider, MD  furosemide (LASIX) 40 MG tablet Take 1 tablet (40 mg total) by mouth daily. 08/29/10  Yes Syliva Overman, MD  gabapentin (NEURONTIN) 300 MG capsule Take 1 capsule (300 mg total) by mouth 2 (two) times daily. 11/28/10 01/29/12 Yes Syliva Overman, MD  metoprolol (TOPROL-XL) 50 MG 24 hr tablet Take 50 mg by mouth daily.    Yes Historical Provider, MD  potassium chloride SA (K-DUR,KLOR-CON) 20  MEQ tablet Take 10 mEq by mouth daily.   08/29/10  Yes Syliva Overman, MD  predniSONE (DELTASONE) 20 MG tablet Take 60 mg by mouth daily. X 4 days  12/20/10 12/30/10 Yes Suzi Roots, MD  travoprost, benzalkonium, (TRAVATAN Z) 0.004 % ophthalmic solution Place 1 drop into both eyes at bedtime.    Yes Historical Provider, MD  warfarin (COUMADIN) 4 MG tablet 4-8 mg. Take 8 mg on Tuesday and Saturday, 4 mg on Sun, Finis Bud   10/20/10  Yes Gerrit Friends. Dietrich Pates, MD  albuterol (PROVENTIL HFA;VENTOLIN HFA) 108 (90 BASE) MCG/ACT inhaler Inhale 1-2 puffs into the  lungs every 6 (six) hours as needed for wheezing. 10/02/10 10/02/11  Vida Roller, MD  HYDROcodone-acetaminophen (NORCO) 5-325 MG per tablet Take 1 tablet by mouth every 6 (six) hours as needed. For pain    Historical Provider, MD  METOPROLOL SUCCINATE PO Take 50 mg by mouth daily.     Historical Provider, MD  tiotropium (SPIRIVA HANDIHALER) 18 MCG inhalation capsule   09/24/10   Syliva Overman, MD    Social History: Is married lives at home with his wife,  is a long-term smoker for over 60 years. Previously smoked 1-1/2 packs per day currently cut down to 2 cigarettes per day . He reports that he does not drink alcohol or use illicit drugs.  Family History  Problem Relation Age of Onset  . Cancer Mother   . Cancer Father   . Coronary artery disease Brother     Review of Systems:  Constitutional: Positive for chills and weakness HEENT: Denies photophobia, eye pain, redness, hearing loss, ear pain, congestion, sore throat, rhinorrhea, sneezing, mouth sores, trouble swallowing, neck pain, neck stiffness and tinnitus.   Respiratory: As per history of present illness Cardiovascular: Denies chest pain, palpitations, denies PND or orthopnea Gastrointestinal: Denies nausea, vomiting, abdominal pain, diarrhea, constipation, blood in stool and abdominal distention.  Genitourinary: Denies dysuria, urgency, frequency, hematuria, flank pain and difficulty urinating.  Musculoskeletal: Denies myalgias, back pain, joint swelling, arthralgias and gait problem.  Skin: Denies pallor, rash and wound.  Neurological: Positive for dizziness Hematological: Denies adenopathy. Easy bruising, personal or family bleeding history  Psychiatric/Behavioral: Denies suicidal ideation, mood changes, confusion, nervousness, sleep disturbance and agitation   Physical Exam: Blood pressure 177/107, pulse 94, temperature 97.3 F (36.3 C), temperature source Oral, resp. rate 20, height 5\' 6"  (1.676 m), weight 65.772 kg  (145 lb), SpO2 94.00%. Gen: Awake oriented x3 not in respiratory distress, not using the accessory muscles of respiration. HEENT: PE RRLA, mucosa moist and pink, neck no JVD CVS: S1-S2 irregularly irregular rhythm Lungs: Poor Air movement bilaterally Abdomen: Soft nontender with normal bowel sounds no organomegaly Extremities: 1+ edema and good peripheral pulses Neuro: No localizing signs  Labs on Admission:  Results for orders placed during the hospital encounter of 12/24/10 (from the past 48 hour(s))  CBC     Status: Normal   Collection Time   12/24/10  3:24 PM      Component Value Range Comment   WBC 8.5  4.0 - 10.5 (K/uL)    RBC 4.44  4.22 - 5.81 (MIL/uL)    Hemoglobin 13.1  13.0 - 17.0 (g/dL)    HCT 40.9  81.1 - 91.4 (%)    MCV 95.0  78.0 - 100.0 (fL)    MCH 29.5  26.0 - 34.0 (pg)    MCHC 31.0  30.0 - 36.0 (g/dL)    RDW 78.2  95.6 - 21.3 (%)  Platelets 295  150 - 400 (K/uL)   DIFFERENTIAL     Status: Normal   Collection Time   12/24/10  3:24 PM      Component Value Range Comment   Neutrophils Relative 66  43 - 77 (%)    Neutro Abs 5.6  1.7 - 7.7 (K/uL)    Lymphocytes Relative 22  12 - 46 (%)    Lymphs Abs 1.8  0.7 - 4.0 (K/uL)    Monocytes Relative 12  3 - 12 (%)    Monocytes Absolute 1.0  0.1 - 1.0 (K/uL)    Eosinophils Relative 1  0 - 5 (%)    Eosinophils Absolute 0.1  0.0 - 0.7 (K/uL)    Basophils Relative 0  0 - 1 (%)    Basophils Absolute 0.0  0.0 - 0.1 (K/uL)   BASIC METABOLIC PANEL     Status: Abnormal   Collection Time   12/24/10  3:24 PM      Component Value Range Comment   Sodium 140  135 - 145 (mEq/L)    Potassium 4.6  3.5 - 5.1 (mEq/L)    Chloride 97  96 - 112 (mEq/L)    CO2 37 (*) 19 - 32 (mEq/L)    Glucose, Bld 103 (*) 70 - 99 (mg/dL)    BUN 35 (*) 6 - 23 (mg/dL)    Creatinine, Ser 0.98  0.50 - 1.35 (mg/dL)    Calcium 11.9  8.4 - 10.5 (mg/dL)    GFR calc non Af Amer 81 (*) >90 (mL/min)    GFR calc Af Amer >90  >90 (mL/min)   PROTIME-INR      Status: Abnormal   Collection Time   12/24/10  3:24 PM      Component Value Range Comment   Prothrombin Time 24.6 (*) 11.6 - 15.2 (seconds)    INR 2.18 (*) 0.00 - 1.49    PRO B NATRIURETIC PEPTIDE     Status: Abnormal   Collection Time   12/24/10  3:24 PM      Component Value Range Comment   BNP, POC 21159.0 (*) 0 - 125 (pg/mL)   TROPONIN I     Status: Normal   Collection Time   12/24/10  3:24 PM      Component Value Range Comment   Troponin I <0.30  <0.30 (ng/mL)     Radiological Exams on Admission: Chest x-ray: No active cardiopulmonary disease  EKG: A. fib rate of 94, no acute ST-T wave changes  Assessment/Plan Active Problems:  1. CHRONIC OBSTRUCTIVE PULMONARY DISEASE, ACUTE EXACERBATION: Will admit to a telemetry floor, start him on Atrovent and albuterol nebulizers every 6 hours and when necessary Start IV Solu-Medrol 80 mg q. 6, moxifloxacin, oxygen to keep sats greater than 92%. 2. CHF exacerbation: He does show evidence of volume overload based on 2 plus pitting edema, although chest x-ray does not show evidence of pulmonary edema or pulmonary vascular congestion. I suspect his proBNP level is a little too disproportionately high. We'll repeat this in a.m., will diurese him with IV Lasix 40 mg every 12, continue Toprol, strict I.'s and O.'s, daily weights, also get cardiac enzymes x2 sets. We'll also order a 2-D echo since his last echo was from January 2011 3. chronic Atrial fibrillation: Rate controlled continue diltiazem, metoprolol and Coumadin per pharmacy 4. Tobacco abuse disorder: Counseled Code Status full   Time Spent on Admission:  Della Homan 12/24/2010, 7:06 PM

## 2010-12-25 ENCOUNTER — Other Ambulatory Visit: Payer: Self-pay

## 2010-12-25 DIAGNOSIS — R001 Bradycardia, unspecified: Secondary | ICD-10-CM | POA: Diagnosis not present

## 2010-12-25 LAB — BASIC METABOLIC PANEL
BUN: 29 mg/dL — ABNORMAL HIGH (ref 6–23)
CO2: 40 mEq/L (ref 19–32)
Calcium: 9.4 mg/dL (ref 8.4–10.5)
Creatinine, Ser: 0.92 mg/dL (ref 0.50–1.35)
Glucose, Bld: 187 mg/dL — ABNORMAL HIGH (ref 70–99)

## 2010-12-25 LAB — CBC
Hemoglobin: 12 g/dL — ABNORMAL LOW (ref 13.0–17.0)
MCH: 29 pg (ref 26.0–34.0)
MCV: 95.2 fL (ref 78.0–100.0)
RBC: 4.14 MIL/uL — ABNORMAL LOW (ref 4.22–5.81)

## 2010-12-25 MED ORDER — METHYLPREDNISOLONE SODIUM SUCC 40 MG IJ SOLR
40.0000 mg | Freq: Three times a day (TID) | INTRAMUSCULAR | Status: DC
  Administered 2010-12-25 – 2010-12-26 (×3): 40 mg via INTRAVENOUS
  Filled 2010-12-25 (×3): qty 1

## 2010-12-25 MED ORDER — METOPROLOL SUCCINATE ER 50 MG PO TB24
50.0000 mg | ORAL_TABLET | Freq: Every day | ORAL | Status: DC
  Administered 2010-12-26 – 2010-12-27 (×2): 50 mg via ORAL
  Filled 2010-12-25 (×2): qty 1

## 2010-12-25 NOTE — Progress Notes (Signed)
Dr. Sharl Ma was notified of pts CO2 level >40 this am. Also the patient had a 4 beat run of V. Tach with HR of 30's.  The patient was asymptomatic and asleep on assessment.  The pt is also having some pauses.  New orders given and followed.

## 2010-12-25 NOTE — Progress Notes (Addendum)
Subjective: Patient seen and examined, became bradycardic this am, HR in 40's with 4 beat run of v tach, sinus pause, < 3 sec, BNP is elevated , actually went up/ from yesterday. Breathing is better. Patient is on high dose SOlumedrol. Objective: Vital signs in last 24 hours: Temp:  [97.3 F (36.3 C)-97.8 F (36.6 C)] 97.6 F (36.4 C) (10/14 0600) Pulse Rate:  [48-115] 48  (10/14 0600) Resp:  [16-26] 16  (10/14 0600) BP: (144-177)/(80-107) 144/82 mmHg (10/14 0600) SpO2:  [92 %-99 %] 99 % (10/14 0745) Weight:  [65.772 kg (145 lb)-69.763 kg (153 lb 12.8 oz)] 150 lb 12.8 oz (68.402 kg) (10/14 0600) Weight change:  Last BM Date: 12/24/10  Intake/Output from previous day: 10/13 0701 - 10/14 0700 In: 600 [P.O.:600] Out: 1800 [Urine:1800]     Physical Exam: General: Alert, awake, oriented x3, in no acute distress. HEENT: No bruits, no goiter. Heart: Regular rate and rhythm, without murmurs, rubs, gallops. Lungs: Bibasilar crackles.. Abdomen: Soft, nontender, nondistended, positive bowel sounds. Extremities: Bilateral 2 + Edema Neuro: Grossly intact, nonfocal.    Lab Results: Basic Metabolic Panel:  Basename 12/25/10 0610 12/24/10 1524  NA 140 140  K 4.8 4.6  CL 95* 97  CO2 40* 37*  GLUCOSE 187* 103*  BUN 29* 35*  CREATININE 0.92 0.98  CALCIUM 9.4 10.0  MG -- --  PHOS -- --     CBC:  Basename 12/25/10 0610 12/24/10 1524  WBC 5.5 8.5  NEUTROABS -- 5.6  HGB 12.0* 13.1  HCT 39.4 42.2  MCV 95.2 95.0  PLT 302 295   Cardiac Enzymes:  Basename 12/24/10 1524  CKTOTAL --  CKMB --  CKMBINDEX --  TROPONINI <0.30   BNP:  Basename 12/25/10 0610 12/24/10 1524  POCBNP 13417.0* 21159.0*   D-Dimer: No results found for this basename: DDIMER:2 in the last 72 hours CBG: No results found for this basename: GLUCAP:6 in the last 72 hours Hemoglobin A1C: No results found for this basename: HGBA1C in the last 72 hours Fasting Lipid Panel: No results found for this  basename: CHOL,HDL,LDLCALC,TRIG,CHOLHDL,LDLDIRECT in the last 72 hours Thyroid Function Tests: No results found for this basename: TSH,T4TOTAL,FREET4,T3FREE,THYROIDAB in the last 72 hours Anemia Panel: No results found for this basename: VITAMINB12,FOLATE,FERRITIN,TIBC,IRON,RETICCTPCT in the last 72 hours  Alcohol Level: No results found for this basename: ETH:2 in the last 72 hours  No results found for this or any previous visit (from the past 240 hour(s)).  Studies/Results: Dg Chest 2 View  12/24/2010  *RADIOLOGY REPORT*  Clinical Data: Shortness of breath and cough.  CHEST - 2 VIEW  Comparison: 12/20/2010  Findings: The cardiopericardial silhouette is enlarged. The lungs are clear without focal infiltrate, edema, pneumothorax or pleural effusion. Imaged bony structures of the thorax are intact. Telemetry leads overlie the chest.  IMPRESSION: Stable.  No acute cardiopulmonary process.  Original Report Authenticated By: ERIC A. MANSELL, M.D.    Medications: Scheduled Meds:   . albuterol  2.5 mg Nebulization Q6H  . albuterol  5 mg Nebulization Once  . albuterol  5 mg Nebulization Once  . albuterol  5 mg Nebulization Once  . diltiazem  300 mg Oral Daily  . furosemide  40 mg Intravenous Once  . furosemide  40 mg Intravenous Q12H  . gabapentin  300 mg Oral BID  . ipratropium  0.5 mg Nebulization Once  . ipratropium  0.5 mg Nebulization Q6H  . methylPREDNISolone sodium succinate  80 mg Intravenous Q6H  . metoprolol  50 mg Oral Daily  . moxifloxacin  400 mg Oral q1800  . potassium chloride SA  10 mEq Oral Daily  . predniSONE  60 mg Oral Once  . sodium chloride  3 mL Intravenous Q12H  . travoprost (benzalkonium)  1 drop Both Eyes QHS  . warfarin  4 mg Oral ONCE-1800  . DISCONTD: albuterol  5 mg Nebulization Q4H  . DISCONTD: methylPREDNISolone sodium succinate  80 mg Intravenous Q6H  . DISCONTD: predniSONE  60 mg Oral QAC breakfast   Continuous Infusions:  PRN  Meds:.acetaminophen, acetaminophen, albuterol, ipratropium, ondansetron (ZOFRAN) IV, ondansetron, sodium chloride, DISCONTD: sodium chloride  Assessment/Plan:  Principal Problem:  CHF: Patient now has elevated BNP , concerning for CHF exacerbation, Will increase the lasix to 40 mg IV Q 12 hr. And check BNP in am. @ D echo is pending.. Active Problems:  Atrial fibrillation: HR well controlled, on warfarin. Infact patient has bradycardia, will hold the metoprolol and cardizem for HR of less than 50. EKG done today shows A fib.Marland KitchenMarland KitchenWill continue warfarin and check PT/INR.  COPD: Breathing much improved, will cut down the solumedrol to 40 mg IV Q 8hr, due to worsening fluid overload, Continue the albuterol and ipratropium nebulizer treatments. Bradycardia: Patient now has bradycardia, will hold the cardizem and Metoprolol for HR less than 50. Also has sinus pause, and 4 beat run of v tach, check Magnesium levels. DVT prophylaxis: patient on warfarin.     LOS: 1 day   Mckenleigh Tarlton S 12/25/2010, 8:14 AM

## 2010-12-26 ENCOUNTER — Ambulatory Visit: Payer: Medicare Other | Admitting: Family Medicine

## 2010-12-26 ENCOUNTER — Encounter: Payer: Self-pay | Admitting: Family Medicine

## 2010-12-26 DIAGNOSIS — I369 Nonrheumatic tricuspid valve disorder, unspecified: Secondary | ICD-10-CM

## 2010-12-26 LAB — BASIC METABOLIC PANEL
CO2: 39 mEq/L — ABNORMAL HIGH (ref 19–32)
Chloride: 92 mEq/L — ABNORMAL LOW (ref 96–112)
GFR calc non Af Amer: 85 mL/min — ABNORMAL LOW (ref >90)
Glucose, Bld: 125 mg/dL — ABNORMAL HIGH (ref 70–99)
Potassium: 4.6 mEq/L (ref 3.5–5.1)
Sodium: 137 mEq/L (ref 135–145)

## 2010-12-26 MED ORDER — FUROSEMIDE 10 MG/ML IJ SOLN
40.0000 mg | Freq: Every day | INTRAMUSCULAR | Status: DC
  Administered 2010-12-27: 40 mg via INTRAVENOUS
  Filled 2010-12-26: qty 4

## 2010-12-26 MED ORDER — METHYLPREDNISOLONE SODIUM SUCC 40 MG IJ SOLR
40.0000 mg | Freq: Two times a day (BID) | INTRAMUSCULAR | Status: DC
Start: 2010-12-26 — End: 2010-12-27
  Administered 2010-12-26 – 2010-12-27 (×2): 40 mg via INTRAVENOUS
  Filled 2010-12-26 (×3): qty 1

## 2010-12-26 MED ORDER — DILTIAZEM HCL ER COATED BEADS 120 MG PO CP24
120.0000 mg | ORAL_CAPSULE | Freq: Every day | ORAL | Status: DC
  Administered 2010-12-26 – 2010-12-27 (×2): 120 mg via ORAL
  Filled 2010-12-26 (×2): qty 1

## 2010-12-26 MED ORDER — DILTIAZEM HCL ER COATED BEADS 180 MG PO CP24
180.0000 mg | ORAL_CAPSULE | Freq: Every day | ORAL | Status: DC
  Administered 2010-12-26 – 2010-12-27 (×2): 180 mg via ORAL
  Filled 2010-12-26 (×2): qty 1

## 2010-12-26 NOTE — Progress Notes (Signed)
Please address code status of patient.  Thanks. 

## 2010-12-26 NOTE — Progress Notes (Signed)
*  PRELIMINARY RESULTS* Echocardiogram 2D Echocardiogram has been performed.  Theodore [name] 12/26/2010, 1:50 PM

## 2010-12-26 NOTE — Progress Notes (Signed)
Subjective: Patient seen and examined, breathing is better heart rate is well controlled.   Objective: Vital signs in last 24 hours: Temp:  [97.8 F (36.6 C)-98.6 F (37 C)] 97.8 F (36.6 C) (10/15 0634) Pulse Rate:  [56-69] 69  (10/15 0634) Resp:  [18] 18  (10/15 0634) BP: (121-154)/(63-85) 154/85 mmHg (10/15 0634) SpO2:  [93 %-98 %] 93 % (10/15 1447) FiO2 (%):  [21 %] 21 % (10/15 0755) Weight:  [69.4 kg (153 lb)] 153 lb (69.4 kg) (10/15 0634) Weight change: 3.629 kg (8 lb) Last BM Date: 12/25/10  Intake/Output from previous day: 10/14 0701 - 10/15 0700 In: 480 [P.O.:480] Out: 875 [Urine:875]     Physical Exam: General: Alert, awake, oriented x3, in no acute distress. HEENT: No bruits, no goiter. Heart: Regular rate and rhythm, without murmurs, rubs, gallops. Lungs: Clear to auscultation bilaterally. Abdomen: Soft, nontender, nondistended, positive bowel sounds. Extremities: No clubbing cyanosis or edema with positive pedal pulses. Neuro: Grossly intact, nonfocal.    Lab Results: Basic Metabolic Panel:  Basename 12/26/10 0552 12/25/10 0610  NA 137 140  K 4.6 4.8  CL 92* 95*  CO2 39* 40*  GLUCOSE 125* 187*  BUN 27* 29*  CREATININE 0.87 0.92  CALCIUM 9.4 9.4  MG -- 2.1  PHOS -- --   Liver Function Tests: No results found for this basename: AST:2,ALT:2,ALKPHOS:2,BILITOT:2,PROT:2,ALBUMIN:2 in the last 72 hours No results found for this basename: LIPASE:2,AMYLASE:2 in the last 72 hours No results found for this basename: AMMONIA:2 in the last 72 hours CBC:  Basename 12/25/10 0610 12/24/10 1524  WBC 5.5 8.5  NEUTROABS -- 5.6  HGB 12.0* 13.1  HCT 39.4 42.2  MCV 95.2 95.0  PLT 302 295   Cardiac Enzymes:  Basename 12/24/10 1524  CKTOTAL --  CKMB --  CKMBINDEX --  TROPONINI <0.30   BNP:  Basename 12/26/10 0602 12/25/10 0610 12/24/10 1524  POCBNP 3456.0* 13417.0* 21159.0*   D-Dimer: No results found for this basename: DDIMER:2 in the last 72  hours CBG: No results found for this basename: GLUCAP:6 in the last 72 hours Hemoglobin A1C: No results found for this basename: HGBA1C in the last 72 hours Fasting Lipid Panel: No results found for this basename: CHOL,HDL,LDLCALC,TRIG,CHOLHDL,LDLDIRECT in the last 72 hours Thyroid Function Tests: No results found for this basename: TSH,T4TOTAL,FREET4,T3FREE,THYROIDAB in the last 72 hours Anemia Panel: No results found for this basename: VITAMINB12,FOLATE,FERRITIN,TIBC,IRON,RETICCTPCT in the last 72 hours  No results found for this basename: ETH:2 in the last 72 hours  No results found for this or any previous visit (from the past 240 hour(s)).  Studies/Results: Dg Chest 2 View  12/24/2010  *RADIOLOGY REPORT*  Clinical Data: Shortness of breath and cough.  CHEST - 2 VIEW  Comparison: 12/20/2010  Findings: The cardiopericardial silhouette is enlarged. The lungs are clear without focal infiltrate, edema, pneumothorax or pleural effusion. Imaged bony structures of the thorax are intact. Telemetry leads overlie the chest.  IMPRESSION: Stable.  No acute cardiopulmonary process.  Original Report Authenticated By: ERIC A. MANSELL, M.D.    Medications: Scheduled Meds:   . albuterol  2.5 mg Nebulization Q6H  . diltiazem  120 mg Oral Daily   And  . diltiazem  180 mg Oral Daily  . furosemide  40 mg Intravenous Q12H  . gabapentin  300 mg Oral BID  . ipratropium  0.5 mg Nebulization Q6H  . methylPREDNISolone sodium succinate  40 mg Intravenous Q8H  . metoprolol  50 mg Oral Daily  .  moxifloxacin  400 mg Oral q1800  . potassium chloride SA  10 mEq Oral Daily  . sodium chloride  3 mL Intravenous Q12H  . travoprost (benzalkonium)  1 drop Both Eyes QHS  . warfarin  4 mg Oral ONCE-1800  . DISCONTD: diltiazem  300 mg Oral Daily   Continuous Infusions:  PRN Meds:.acetaminophen, acetaminophen, albuterol, ipratropium, ondansetron (ZOFRAN) IV, ondansetron, sodium  chloride  Assessment/Plan:  CHF: Good diuresis with lasix, will switch to once daily 40 mg IV daily. :Active Problems:  Atrial fibrillation: HR well controlled, on warfarin. Infact patient has bradycardia, will hold the metoprolol and cardizem for HR of less than 50. EKG done today shows A fib.Marland KitchenMarland KitchenWill continue warfarin and check PT/INR.  COPD: Breathing much improved, will cut down the solumedrol to 40 mg IV Q 12 hr, due to worsening fluid overload, Continue the albuterol and ipratropium nebulizer treatments.  Bradycardia: resolved. DVT prophylaxis: patient on warfarin.     LOS: 2 days   LAMA,GAGAN S 12/26/2010, 3:01 PM

## 2010-12-27 LAB — BASIC METABOLIC PANEL
CO2: 37 mEq/L — ABNORMAL HIGH (ref 19–32)
GFR calc non Af Amer: 80 mL/min — ABNORMAL LOW (ref >90)
Glucose, Bld: 146 mg/dL — ABNORMAL HIGH (ref 70–99)
Potassium: 4.6 mEq/L (ref 3.5–5.1)
Sodium: 135 mEq/L (ref 135–145)

## 2010-12-27 MED ORDER — MOXIFLOXACIN HCL 400 MG PO TABS: 400.0000 mg | ORAL_TABLET | Freq: Every day | ORAL | Status: AC

## 2010-12-27 MED ORDER — PREDNISONE 50 MG PO TABS: ORAL_TABLET | ORAL | Status: AC

## 2010-12-27 MED ORDER — IPRATROPIUM BROMIDE 0.02 % IN SOLN: 500.0000 ug | RESPIRATORY_TRACT | Status: DC | PRN

## 2010-12-27 NOTE — Discharge Summary (Signed)
Physician Discharge Summary  Patient ID: Theodore [name] MRN: [id number] DOB/AGE: Aug 14, 1940 70 y.o.  Admit date: 12/24/2010 Discharge date: 12/27/2010  Primary Care Physician:  Theodore Overman, MD, MD   Discharge Diagnoses:   CHF  COPD Atrial Fibrillation Tobacco abuse Hypertension   Present on Admission:  .C H F .Atrial fibrillation .COPD .DYSPNEA .Tobacco abuse disorder  Current Discharge Medication List    START taking these medications   Details  ipratropium (ATROVENT) 0.02 % nebulizer solution Take 2.5 mLs (500 mcg total) by nebulization every 4 (four) hours as needed for wheezing. Qty: 75 mL, Refills: 1    moxifloxacin (AVELOX) 400 MG tablet Take 1 tablet (400 mg total) by mouth daily at 6 PM. Qty: 5 tablet, Refills: 0      CONTINUE these medications which have CHANGED   Details  predniSONE (DELTASONE) 50 MG tablet Prednisone 40 mg po daily x  1 day  Then Prednisone 30 mg po daily x 1 day then Prednisone 20 mg po daily x 1 day then Prednisone 10 mg po daily x 1day then Prednisone 10 mg po daily x 1 day Prednisone 10 mg po daily x 1 day then stop... Qty: 4 tablet, Refills: 0      CONTINUE these medications which have NOT CHANGED   Details  albuterol (PROVENTIL HFA;VENTOLIN HFA) 108 (90 BASE) MCG/ACT inhaler Inhale 2 puffs into the lungs every 4 (four) hours as needed for wheezing. Qty: 1 Inhaler, Refills: 0    albuterol (PROVENTIL) (2.5 MG/3ML) 0.083% nebulizer solution Take 2.5 mg by nebulization 3 (three) times daily as needed. For shortness of breath    budesonide-formoterol (SYMBICORT) 160-4.5 MCG/ACT inhaler Inhale 2 puffs into the lungs 2 (two) times daily. Needs a refill, please     diltiazem (CARTIA XT) 300 MG 24 hr capsule Take 300 mg by mouth daily.      furosemide (LASIX) 40 MG tablet Take 1 tablet (40 mg total) by mouth daily. Qty: 30 tablet, Refills: 3   Associated Diagnoses: Leg edema    gabapentin (NEURONTIN) 300 MG capsule Take 1  capsule (300 mg total) by mouth 2 (two) times daily. Qty: 60 capsule, Refills: 2   Associated Diagnoses: Post herpetic neuralgia    metoprolol (TOPROL-XL) 50 MG 24 hr tablet Take 50 mg by mouth daily.     potassium chloride SA (K-DUR,KLOR-CON) 20 MEQ tablet Take 10 mEq by mouth daily.      travoprost, benzalkonium, (TRAVATAN Z) 0.004 % ophthalmic solution Place 1 drop into both eyes at bedtime.     warfarin (COUMADIN) 4 MG tablet 4-8 mg. Take 8 mg on Tuesday and Saturday, 4 mg on Sun, Mon,Wed,Thurs,Fri      tiotropium (SPIRIVA HANDIHALER) 18 MCG inhalation capsule        STOP taking these medications     amoxicillin (AMOXIL) 500 MG capsule      HYDROcodone-acetaminophen (NORCO) 5-325 MG per tablet           Follow up Dr Algie Coffer in 2 weeks     Significant Diagnostic Studies:  Dg Chest 2 View  12/24/2010  *RADIOLOGY REPORT*  Clinical Data: Shortness of breath and cough.  CHEST - 2 VIEW  Comparison: 12/20/2010  Findings: The cardiopericardial silhouette is enlarged. The lungs are clear without focal infiltrate, edema, pneumothorax or pleural effusion. Imaged bony structures of the thorax are intact. Telemetry leads overlie the chest.  IMPRESSION: Stable.  No acute cardiopulmonary process.  Original Report Authenticated By: ERIC A. MANSELL, M.D.  Brief H and P: For complete details please refer to admission H and P, but in brief , [name] is a 70 year old African American gentleman with prior history of diastolic CHF, COPD, tobacco use, chronic A. fib on Coumadin reports that he was in his usual state of health up until a week ago. He started experiencing minimally productive cough associated with increasing shortness of breath and wheezing about a week ago for which he's tried using his inhalers at home. Then came to the ER on 10/9 for unremarkable chest x-ray, was treated with nebulizers and subsequently sent home on amoxicillin and prednisone taper. Patient reports that he  felt a little better with the above medications but then started feeling worse again over the last 2 days and decided to come to the ER with worsening shortness of breath, wheezing and weakness. Denies any fevers, reports chills off and on for the last 4 days. He was treated with 60 mg of prednisone and nebulizer treatments with a plan to initially go home, but desatted into the 80s when he ambulated and hence [facility] were consulted for admission and management     Hospital Course:  Principal Problem:  *C H F: Patient was started on Lasix and improved with the excellent diuresis. At this time patient is euvolemic and will be discharged home on his home medication of Lasix which he takes at 40 mg by mouth daily. Active Problems:  Atrial fibrillation: Patient has been taking Cardizem at home and he was started on Cardizem in the hospital along with that a Toprol. He is also taking Coumadin and his other is therapeutic at this time. Patient goes to Dr. Dietrich Pates  to check his PT/INR. And will follow up with him in one week.    COPD : Patient was admitted with COPD exacerbation and was started on IV Solu-Medrol along with an aggressive treatments with albuterol and ipratropium nebulizers. Patient has responded well to the above treatments and will be discharged home on prednisone taper. We'll also continue the patient on by mouth Avelox for 5 more days.  DYSPNEA This is again multifactorial due to thiscopd exacerbation as well as CHF exacerbation. Which is resolved at this time.  Bradycardia  patient was found to have bradycardia in the hospital so his metoprolol as well as Cardizem were held. Patient was found to be hypokalemic and potassium was replaced. Patient's medications including metoprolol as well as Cardizem were restarted and patient's heart rate is stable at this time.  At this time the patient is stable for discharge and will be discharged home.   Time spent on Discharge: 45  min  Signed: LAMA,GAGAN S 12/27/2010, 2:53 PM

## 2010-12-27 NOTE — Progress Notes (Signed)
Discharge Summary: a/o.vss. Up ad lib. Saline lock removed. Tele d/c. No complaints of distress. Discharge instructions given. Prescriptions given. Pt verbalized understanding of instuctions. Left floor via wheelchair with nursing staff and family members.

## 2011-01-04 ENCOUNTER — Ambulatory Visit (INDEPENDENT_AMBULATORY_CARE_PROVIDER_SITE_OTHER): Payer: Medicare Other | Admitting: *Deleted

## 2011-01-04 DIAGNOSIS — I4891 Unspecified atrial fibrillation: Secondary | ICD-10-CM

## 2011-01-04 DIAGNOSIS — Z7901 Long term (current) use of anticoagulants: Secondary | ICD-10-CM

## 2011-01-04 LAB — POCT INR: INR: 4.2

## 2011-01-06 ENCOUNTER — Encounter: Payer: Self-pay | Admitting: Family Medicine

## 2011-01-11 ENCOUNTER — Encounter: Payer: Self-pay | Admitting: Family Medicine

## 2011-01-11 ENCOUNTER — Ambulatory Visit (INDEPENDENT_AMBULATORY_CARE_PROVIDER_SITE_OTHER): Payer: Medicare Other | Admitting: Family Medicine

## 2011-01-11 VITALS — BP 110/62 | HR 50 | Resp 16 | Ht 67.5 in | Wt 152.4 lb

## 2011-01-11 DIAGNOSIS — Z720 Tobacco use: Secondary | ICD-10-CM

## 2011-01-11 DIAGNOSIS — J449 Chronic obstructive pulmonary disease, unspecified: Secondary | ICD-10-CM

## 2011-01-11 DIAGNOSIS — I10 Essential (primary) hypertension: Secondary | ICD-10-CM

## 2011-01-11 DIAGNOSIS — Z23 Encounter for immunization: Secondary | ICD-10-CM

## 2011-01-11 DIAGNOSIS — I4891 Unspecified atrial fibrillation: Secondary | ICD-10-CM

## 2011-01-11 DIAGNOSIS — F17200 Nicotine dependence, unspecified, uncomplicated: Secondary | ICD-10-CM

## 2011-01-11 MED ORDER — METOPROLOL SUCCINATE ER 50 MG PO TB24: 50.0000 mg | ORAL_TABLET | Freq: Every day | ORAL | Status: DC

## 2011-01-11 NOTE — Patient Instructions (Addendum)
F/u in 4 months  INCREASE symbicort to TWO puffs twice daily   Flu vaccine today  You need to stop smoking

## 2011-01-12 ENCOUNTER — Ambulatory Visit (INDEPENDENT_AMBULATORY_CARE_PROVIDER_SITE_OTHER): Payer: Medicare Other | Admitting: *Deleted

## 2011-01-12 DIAGNOSIS — I4891 Unspecified atrial fibrillation: Secondary | ICD-10-CM

## 2011-01-12 DIAGNOSIS — Z7901 Long term (current) use of anticoagulants: Secondary | ICD-10-CM

## 2011-01-15 NOTE — Assessment & Plan Note (Signed)
Controlled, no change in medication  

## 2011-01-15 NOTE — Progress Notes (Signed)
  Subjective:    Patient ID: Theodore [name], male    DOB: 1940/09/25, 70 y.o.   MRN: [id number]  HPI Pt recently hospitalized for 10/13 to 10/16 with COPD exacrbation.He has completed prednisone taper and antibiotic course  reports significant symptom improvement.Unfortunately , he is still unwilling to comiting to smoking cessation   Review of Systems See HPI Denies recent fever or chills. Denies sinus pressure, nasal congestion, ear pain or sore throat. Chronic  chest congestion, productive cough  And wheezing. Denies chest pains, palpitations and leg swelling Denies abdominal pain, nausea, vomiting,diarrhea or constipation.   Denies dysuria, frequency, hesitancy or incontinence. Denies joint pain, swelling and limitation in mobility. Denies headaches, seizures, numbness, or tingling. Denies depression, anxiety or insomnia. Denies skin break down or rash.        Objective:   Physical Exam Patient alert and oriented and in no cardiopulmonary distress.  HEENT: No facial asymmetry, EOMI, no sinus tenderness,  oropharynx pink and moist.  Neck supple no adenopathy.  Chest: Markedly decreased air entry bilaterally with wheezing  CVS: S1, S2 no murmurs, no S3.  ABD: Soft non tender. Bowel sounds normal.  Ext: No edema  MS: Adequate ROM spine, shoulders, hips and knees.  Skin: Intact, no ulcerations or rash noted.  Psych: Good eye contact, normal affect. Memory impaired not anxious or depressed appearing.  CNS: CN 2-12 intact, power, normal throughout.        Assessment & Plan:

## 2011-01-15 NOTE — Assessment & Plan Note (Signed)
Deteriorating, recently hospitalized x 4days with exacerbation, reports symptom resolution at this time

## 2011-01-15 NOTE — Assessment & Plan Note (Signed)
Unchanged and unwilling to quit 

## 2011-02-01 ENCOUNTER — Encounter: Payer: Medicare Other | Admitting: *Deleted

## 2011-02-14 ENCOUNTER — Other Ambulatory Visit: Payer: Self-pay | Admitting: Cardiology

## 2011-03-09 ENCOUNTER — Other Ambulatory Visit: Payer: Self-pay | Admitting: Family Medicine

## 2011-03-10 ENCOUNTER — Other Ambulatory Visit: Payer: Self-pay | Admitting: Family Medicine

## 2011-03-16 ENCOUNTER — Telehealth: Payer: Self-pay | Admitting: Family Medicine

## 2011-03-16 DIAGNOSIS — R600 Localized edema: Secondary | ICD-10-CM

## 2011-03-16 MED ORDER — FUROSEMIDE 40 MG PO TABS: 40.0000 mg | ORAL_TABLET | Freq: Every day | ORAL | Status: DC

## 2011-03-16 NOTE — Telephone Encounter (Signed)
Sent in

## 2011-03-17 ENCOUNTER — Other Ambulatory Visit: Payer: Self-pay

## 2011-03-17 ENCOUNTER — Telehealth: Payer: Self-pay | Admitting: Family Medicine

## 2011-03-17 DIAGNOSIS — R600 Localized edema: Secondary | ICD-10-CM

## 2011-03-17 MED ORDER — FUROSEMIDE 40 MG PO TABS: 40.0000 mg | ORAL_TABLET | Freq: Two times a day (BID) | ORAL | Status: DC

## 2011-03-17 NOTE — Telephone Encounter (Signed)
pls refill x 4 with the directions pt has actually been taking, verify withpharmacy if twice daily pls fill twice daily and correct med list

## 2011-03-17 NOTE — Telephone Encounter (Signed)
They were sent in yesterday to [facility] electronically and I tried to call pt to let him know and the number that was provided was disconnected

## 2011-03-17 NOTE — Telephone Encounter (Signed)
States he takes them bid. Med list has qd. Do I need to send in twice daily?

## 2011-03-17 NOTE — Telephone Encounter (Signed)
Refilled x 4 as bid

## 2011-03-18 ENCOUNTER — Inpatient Hospital Stay (HOSPITAL_COMMUNITY): Payer: Medicare Other

## 2011-03-18 ENCOUNTER — Encounter (HOSPITAL_COMMUNITY): Payer: Self-pay | Admitting: *Deleted

## 2011-03-18 ENCOUNTER — Emergency Department (HOSPITAL_COMMUNITY): Payer: Medicare Other

## 2011-03-18 ENCOUNTER — Other Ambulatory Visit: Payer: Self-pay

## 2011-03-18 ENCOUNTER — Inpatient Hospital Stay (HOSPITAL_COMMUNITY)
Admission: EM | Admit: 2011-03-18 | Discharge: 2011-03-20 | DRG: 291 | Disposition: A | Discharge status: Home / Self Care | Payer: Medicare Other | Attending: Internal Medicine | Admitting: Internal Medicine

## 2011-03-18 DIAGNOSIS — Z79899 Other long term (current) drug therapy: Secondary | ICD-10-CM

## 2011-03-18 DIAGNOSIS — M545 Low back pain, unspecified: Secondary | ICD-10-CM

## 2011-03-18 DIAGNOSIS — J309 Allergic rhinitis, unspecified: Secondary | ICD-10-CM

## 2011-03-18 DIAGNOSIS — J441 Chronic obstructive pulmonary disease with (acute) exacerbation: Secondary | ICD-10-CM | POA: Diagnosis present

## 2011-03-18 DIAGNOSIS — B0229 Other postherpetic nervous system involvement: Secondary | ICD-10-CM

## 2011-03-18 DIAGNOSIS — I4891 Unspecified atrial fibrillation: Secondary | ICD-10-CM | POA: Diagnosis present

## 2011-03-18 DIAGNOSIS — Z7901 Long term (current) use of anticoagulants: Secondary | ICD-10-CM

## 2011-03-18 DIAGNOSIS — K4090 Unilateral inguinal hernia, without obstruction or gangrene, not specified as recurrent: Secondary | ICD-10-CM

## 2011-03-18 DIAGNOSIS — D649 Anemia, unspecified: Secondary | ICD-10-CM | POA: Diagnosis present

## 2011-03-18 DIAGNOSIS — D638 Anemia in other chronic diseases classified elsewhere: Secondary | ICD-10-CM | POA: Diagnosis present

## 2011-03-18 DIAGNOSIS — Z1211 Encounter for screening for malignant neoplasm of colon: Secondary | ICD-10-CM

## 2011-03-18 DIAGNOSIS — Z23 Encounter for immunization: Secondary | ICD-10-CM

## 2011-03-18 DIAGNOSIS — Z720 Tobacco use: Secondary | ICD-10-CM

## 2011-03-18 DIAGNOSIS — J449 Chronic obstructive pulmonary disease, unspecified: Secondary | ICD-10-CM

## 2011-03-18 DIAGNOSIS — B029 Zoster without complications: Secondary | ICD-10-CM

## 2011-03-18 DIAGNOSIS — M6530 Trigger finger, unspecified finger: Secondary | ICD-10-CM

## 2011-03-18 DIAGNOSIS — M109 Gout, unspecified: Secondary | ICD-10-CM

## 2011-03-18 DIAGNOSIS — I509 Heart failure, unspecified: Secondary | ICD-10-CM | POA: Diagnosis present

## 2011-03-18 DIAGNOSIS — J189 Pneumonia, unspecified organism: Secondary | ICD-10-CM | POA: Diagnosis present

## 2011-03-18 DIAGNOSIS — F528 Other sexual dysfunction not due to a substance or known physiological condition: Secondary | ICD-10-CM

## 2011-03-18 DIAGNOSIS — J9600 Acute respiratory failure, unspecified whether with hypoxia or hypercapnia: Secondary | ICD-10-CM | POA: Diagnosis present

## 2011-03-18 DIAGNOSIS — I10 Essential (primary) hypertension: Secondary | ICD-10-CM

## 2011-03-18 DIAGNOSIS — F17200 Nicotine dependence, unspecified, uncomplicated: Secondary | ICD-10-CM | POA: Diagnosis present

## 2011-03-18 DIAGNOSIS — I5033 Acute on chronic diastolic (congestive) heart failure: Principal | ICD-10-CM | POA: Diagnosis present

## 2011-03-18 DIAGNOSIS — R600 Localized edema: Secondary | ICD-10-CM

## 2011-03-18 DIAGNOSIS — R609 Edema, unspecified: Secondary | ICD-10-CM

## 2011-03-18 LAB — CBC
HCT: 43 % (ref 39.0–52.0)
Hemoglobin: 12.6 g/dL — ABNORMAL LOW (ref 13.0–17.0)
Hemoglobin: 11.4 g/dL — ABNORMAL LOW (ref 13.0–17.0)
MCH: 28.2 pg (ref 26.0–34.0)
MCH: 28.6 pg (ref 26.0–34.0)
MCHC: 29.3 g/dL — ABNORMAL LOW (ref 30.0–36.0)
MCHC: 29.6 g/dL — ABNORMAL LOW (ref 30.0–36.0)
MCV: 96.2 fL (ref 78.0–100.0)
MCV: 96.7 fL (ref 78.0–100.0)
RBC: 4.47 MIL/uL (ref 4.22–5.81)

## 2011-03-18 LAB — COMPREHENSIVE METABOLIC PANEL
ALT: 17 U/L (ref 0–53)
Albumin: 3.9 g/dL (ref 3.5–5.2)
Alkaline Phosphatase: 97 U/L (ref 39–117)
BUN: 17 mg/dL (ref 6–23)
BUN: 17 mg/dL (ref 6–23)
Calcium: 10 mg/dL (ref 8.4–10.5)
Calcium: 9.4 mg/dL (ref 8.4–10.5)
Creatinine, Ser: 0.91 mg/dL (ref 0.50–1.35)
Creatinine, Ser: 0.89 mg/dL (ref 0.50–1.35)
GFR calc Af Amer: >90 mL/min (ref >90)
GFR calc Af Amer: >90 mL/min (ref >90)
Glucose, Bld: 125 mg/dL — ABNORMAL HIGH (ref 70–99)
Glucose, Bld: 130 mg/dL — ABNORMAL HIGH (ref 70–99)
Potassium: 3.8 mEq/L (ref 3.5–5.1)
Sodium: 143 mEq/L (ref 135–145)
Total Protein: 8.2 g/dL (ref 6.0–8.3)
Total Protein: 7.2 g/dL (ref 6.0–8.3)

## 2011-03-18 LAB — DIFFERENTIAL
Basophils Absolute: 0 10*3/uL (ref 0.0–0.1)
Basophils Relative: 0 % (ref 0–1)
Eosinophils Absolute: 0.2 10*3/uL (ref 0.0–0.7)
Eosinophils Relative: 2 % (ref 0–5)
Lymphocytes Relative: 5 % — ABNORMAL LOW (ref 12–46)
Lymphs Abs: 2.2 10*3/uL (ref 0.7–4.0)
Lymphs Abs: 0.4 10*3/uL — ABNORMAL LOW (ref 0.7–4.0)
Monocytes Absolute: 0.9 10*3/uL (ref 0.1–1.0)
Monocytes Relative: 11 % (ref 3–12)
Neutro Abs: 6.3 10*3/uL (ref 1.7–7.7)
Neutrophils Relative %: 60 % (ref 43–77)
Neutrophils Relative %: 93 % — ABNORMAL HIGH (ref 43–77)

## 2011-03-18 LAB — POCT I-STAT TROPONIN I: Troponin i, poc: 0.05 ng/mL (ref 0.00–0.08)

## 2011-03-18 LAB — INFLUENZA PANEL BY PCR (TYPE A & B)
H1N1 flu by pcr: NOT DETECTED
Influenza A By PCR: NEGATIVE
Influenza B By PCR: NEGATIVE

## 2011-03-18 LAB — CARDIAC PANEL(CRET KIN+CKTOT+MB+TROPI)
Relative Index: 4.7 — ABNORMAL HIGH (ref 0.0–2.5)
Total CK: 176 U/L (ref 7–232)
Troponin I: <0.3 ng/mL (ref <0.30)

## 2011-03-18 LAB — PROTIME-INR: Prothrombin Time: 22.9 seconds — ABNORMAL HIGH (ref 11.6–15.2)

## 2011-03-18 LAB — PRO B NATRIURETIC PEPTIDE
Pro B Natriuretic peptide (BNP): 3141 pg/mL — ABNORMAL HIGH (ref 0–125)
Pro B Natriuretic peptide (BNP): 3627 pg/mL — ABNORMAL HIGH (ref 0–125)

## 2011-03-18 LAB — MAGNESIUM: Magnesium: 2 mg/dL (ref 1.5–2.5)

## 2011-03-18 MED ORDER — BRIMONIDINE TARTRATE-TIMOLOL 0.2-0.5 % OP SOLN: 1.0000 [drp] | Freq: Two times a day (BID) | OPHTHALMIC | Status: DC

## 2011-03-18 MED ORDER — SODIUM CHLORIDE 0.9 % IJ SOLN
INTRAMUSCULAR | Status: AC
  Administered 2011-03-18: 3 mL
  Filled 2011-03-18: qty 3

## 2011-03-18 MED ORDER — SODIUM CHLORIDE 0.9 % IV SOLN
Freq: Once | INTRAVENOUS | Status: AC
  Administered 2011-03-18: 07:00:00 via INTRAVENOUS

## 2011-03-18 MED ORDER — METOPROLOL TARTRATE 1 MG/ML IV SOLN
5.0000 mg | Freq: Once | INTRAVENOUS | Status: AC
  Administered 2011-03-18: 5 mg via INTRAVENOUS
  Filled 2011-03-18: qty 5

## 2011-03-18 MED ORDER — ALBUTEROL SULFATE (5 MG/ML) 0.5% IN NEBU
2.5000 mg | INHALATION_SOLUTION | Freq: Four times a day (QID) | RESPIRATORY_TRACT | Status: DC
  Administered 2011-03-18 – 2011-03-20 (×7): 2.5 mg via RESPIRATORY_TRACT
  Filled 2011-03-18 (×8): qty 0.5

## 2011-03-18 MED ORDER — DEXTROSE 5 % IV SOLN
1.0000 g | INTRAVENOUS | Status: DC
  Administered 2011-03-19 – 2011-03-20 (×2): 1 g via INTRAVENOUS
  Filled 2011-03-18 (×3): qty 10

## 2011-03-18 MED ORDER — IPRATROPIUM BROMIDE 0.02 % IN SOLN
0.5000 mg | Freq: Four times a day (QID) | RESPIRATORY_TRACT | Status: DC
  Administered 2011-03-18 – 2011-03-20 (×7): 0.5 mg via RESPIRATORY_TRACT
  Filled 2011-03-18 (×8): qty 2.5

## 2011-03-18 MED ORDER — IOHEXOL 350 MG/ML SOLN
100.0000 mL | Freq: Once | INTRAVENOUS | Status: AC | PRN
  Administered 2011-03-18: 100 mL via INTRAVENOUS

## 2011-03-18 MED ORDER — DILTIAZEM HCL ER COATED BEADS 180 MG PO CP24
180.0000 mg | ORAL_CAPSULE | Freq: Every day | ORAL | Status: DC
  Administered 2011-03-18 – 2011-03-20 (×3): 180 mg via ORAL
  Filled 2011-03-18 (×3): qty 1

## 2011-03-18 MED ORDER — POTASSIUM CHLORIDE CRYS ER 10 MEQ PO TBCR
10.0000 meq | EXTENDED_RELEASE_TABLET | Freq: Every day | ORAL | Status: DC
  Administered 2011-03-19 – 2011-03-20 (×2): 10 meq via ORAL
  Filled 2011-03-18 (×2): qty 1

## 2011-03-18 MED ORDER — DEXTROSE 5 % IV SOLN
500.0000 mg | INTRAVENOUS | Status: DC
  Administered 2011-03-18 – 2011-03-20 (×3): 500 mg via INTRAVENOUS
  Filled 2011-03-18 (×5): qty 500

## 2011-03-18 MED ORDER — FUROSEMIDE 10 MG/ML IJ SOLN
40.0000 mg | Freq: Two times a day (BID) | INTRAMUSCULAR | Status: DC
  Administered 2011-03-18 – 2011-03-20 (×4): 40 mg via INTRAVENOUS
  Filled 2011-03-18 (×4): qty 4

## 2011-03-18 MED ORDER — METHYLPREDNISOLONE SODIUM SUCC 125 MG IJ SOLR
60.0000 mg | Freq: Two times a day (BID) | INTRAMUSCULAR | Status: DC
  Administered 2011-03-18 – 2011-03-19 (×2): 60 mg via INTRAVENOUS
  Filled 2011-03-18 (×2): qty 2

## 2011-03-18 MED ORDER — BRIMONIDINE TARTRATE 0.2 % OP SOLN
1.0000 [drp] | Freq: Two times a day (BID) | OPHTHALMIC | Status: DC
  Administered 2011-03-18 – 2011-03-20 (×4): 1 [drp] via OPHTHALMIC
  Filled 2011-03-18: qty 5

## 2011-03-18 MED ORDER — TRAVOPROST (BAK FREE) 0.004 % OP SOLN
1.0000 [drp] | Freq: Every day | OPHTHALMIC | Status: DC
  Administered 2011-03-18 – 2011-03-19 (×2): 1 [drp] via OPHTHALMIC
  Filled 2011-03-18: qty 2.5

## 2011-03-18 MED ORDER — IPRATROPIUM BROMIDE 0.02 % IN SOLN
RESPIRATORY_TRACT | Status: AC
  Filled 2011-03-18: qty 2.5

## 2011-03-18 MED ORDER — ALBUTEROL (5 MG/ML) CONTINUOUS INHALATION SOLN
10.0000 mg/h | INHALATION_SOLUTION | Freq: Once | RESPIRATORY_TRACT | Status: AC
  Administered 2011-03-18: 10 mg/h via RESPIRATORY_TRACT

## 2011-03-18 MED ORDER — METHYLPREDNISOLONE SODIUM SUCC 125 MG IJ SOLR
125.0000 mg | Freq: Once | INTRAMUSCULAR | Status: AC
  Administered 2011-03-18: 125 mg via INTRAVENOUS
  Filled 2011-03-18: qty 2

## 2011-03-18 MED ORDER — GABAPENTIN 300 MG PO CAPS
300.0000 mg | ORAL_CAPSULE | Freq: Two times a day (BID) | ORAL | Status: DC
  Administered 2011-03-18 – 2011-03-20 (×5): 300 mg via ORAL
  Filled 2011-03-18 (×5): qty 1

## 2011-03-18 MED ORDER — ALBUTEROL (5 MG/ML) CONTINUOUS INHALATION SOLN
INHALATION_SOLUTION | RESPIRATORY_TRACT | Status: AC
  Filled 2011-03-18: qty 20

## 2011-03-18 MED ORDER — TIMOLOL MALEATE 0.5 % OP SOLN
1.0000 [drp] | Freq: Two times a day (BID) | OPHTHALMIC | Status: DC
  Administered 2011-03-18 – 2011-03-20 (×4): 1 [drp] via OPHTHALMIC
  Filled 2011-03-18: qty 5

## 2011-03-18 MED ORDER — FUROSEMIDE 40 MG PO TABS
80.0000 mg | ORAL_TABLET | Freq: Once | ORAL | Status: AC
  Administered 2011-03-18: 80 mg via ORAL
  Filled 2011-03-18: qty 2

## 2011-03-18 MED ORDER — WARFARIN SODIUM 2 MG PO TABS
4.0000 mg | ORAL_TABLET | Freq: Once | ORAL | Status: AC
  Administered 2011-03-18: 4 mg via ORAL
  Filled 2011-03-18: qty 2
  Filled 2011-03-18: qty 1

## 2011-03-18 MED ORDER — IPRATROPIUM BROMIDE 0.02 % IN SOLN
0.5000 mg | Freq: Once | RESPIRATORY_TRACT | Status: AC
  Administered 2011-03-18: 0.5 mg via RESPIRATORY_TRACT

## 2011-03-18 NOTE — ED Notes (Signed)
Reports out of fluid pills X 4 days

## 2011-03-18 NOTE — Consult Note (Signed)
ANTICOAGULATION CONSULT NOTE - Initial Consult  Pharmacy Consult for Warfarin Indication: atrial fibrillation  No Known Allergies  Patient Measurements:     Vital Signs: Temp: 97.7 F (36.5 C) (01/05 0628) Temp src: Oral (01/05 0628) BP: 149/66 mmHg (01/05 1041) Pulse Rate: 96  (01/05 1041)  Labs:  [facility] 03/18/11 1110 03/18/11 0625  HGB 11.4* 12.6*  HCT 38.5* 43.0  PLT 215 237  APTT 34 --  LABPROT 26.8* 22.9*  INR 2.43* 1.99*  HEPARINUNFRC -- --  CREATININE 0.89 0.91  CKTOTAL -- --  CKMB -- --  TROPONINI -- --   The CrCl is unknown because both a height and weight (above a minimum accepted value) are required for this calculation.  Medical History: Past Medical History  Diagnosis Date  . Anemia     Hemoglobin 11.6 in 04/2008->resolved   . Edema     Lower Extremity  . Tobacco abuse   . Glaucoma   . Allergic rhinitis   . Campath-induced atrial fibrillation     Coumadin;asymptomatic bradycardia on telemetry in 12/2009  . CHF (congestive heart failure)     H/o CHF with preserved LV EF; pulmonary hypertension; normal EF in 03/2009  . Hypertension   . Shingles   . Shortness of breath   . COPD (chronic obstructive pulmonary disease)     Medications:   (Not in a hospital admission)  Assessment: INR therapeutic today (2.4)  Goal of Therapy:  INR 2-3   Plan: Continue home regimen (4mg  today) INR daily for now  Theodore [name] A 03/18/2011,1:26 PM

## 2011-03-18 NOTE — ED Notes (Signed)
Report called to floor, pt lunch tray here. Pt eating, will transfer when he is finished.

## 2011-03-18 NOTE — ED Notes (Signed)
Pt up to bathroom without distress.  nad noted.

## 2011-03-18 NOTE — ED Notes (Addendum)
States he has been taking all his meds as prescribed, with the exception of his Lasix.  Ran out of Lasix.  Last dose was Monday 03/13/11 - had felt fluid was building up and doubled up on his Lasix - had tried to contact his MD for more medication- unable to reach.  Noted to have neck vein distention, edema lower extremeties, abdomen large and tight, bowel sounds present x 4 Quadrants

## 2011-03-18 NOTE — H&P (Addendum)
PCP:  Syliva Overman, MD, MD   DOA:  03/18/2011  6:17 AM  Chief Complaint:  Shortness of breath  HPI: 71 year old male with history significant for COPD (active smoker), emphysema, CHF (EF 55% 12/2010), Atrial fibrillation on coumadin, Hypertension who presented to ED with complaints of worsening shortness of breath over a coarse of few days prior to admission. Patient reports he is on standing diuretic therapy for CHF but has missed Lasix for 6 days  Due to inability to refill this medication. Patient also reports associated chest tightness and cough productive of whitish sputum. As per patient he never checked his temperature but felt he had a fever and chills. He also reports feeling generalized weakness over last few days prior to admission. No complaints of palpitations on admission but reports occasional feeling fast heart beat with no associated lightheadedness or dizziness. No complaints of abdominal pain or nausea or vomiting, no blood in stool or urine. No sick contact at home.  Allergies: No Known Allergies  Prior to Admission medications   Medication Sig Start Date End Date Taking? Authorizing Provider  albuterol (PROVENTIL) (2.5 MG/3ML) 0.083% nebulizer solution Take 2.5 mg by nebulization 3 (three) times daily as needed. For shortness of breath 10/20/10  Yes Syliva Overman, MD  brimonidine-timolol (COMBIGAN) 0.2-0.5 % ophthalmic solution Place 1 drop into both eyes every 12 (twelve) hours.     Yes Historical Provider, MD  budesonide-formoterol (SYMBICORT) 160-4.5 MCG/ACT inhaler Inhale 2 puffs into the lungs 2 (two) times daily.  10/05/10 10/05/11 Yes Syliva Overman, MD  CARTIA XT 300 MG 24 hr capsule TAKE ONE CAPSULE BY MOUTH EVERY DAY 02/14/11  Yes Joni Reining, NP  furosemide (LASIX) 40 MG tablet Take 1 tablet (40 mg total) by mouth 2 (two) times daily. 03/17/11  Yes Syliva Overman, MD  gabapentin (NEURONTIN) 300 MG capsule Take 300 mg by mouth 2 (two) times daily. Does not  take daily  11/28/10 01/29/12 Yes Syliva Overman, MD  metoprolol (TOPROL-XL) 50 MG 24 hr tablet Take 1 tablet (50 mg total) by mouth daily. 01/11/11  Yes Syliva Overman, MD  potassium chloride SA (K-DUR,KLOR-CON) 20 MEQ tablet Take 10 mEq by mouth daily.   08/29/10  Yes Syliva Overman, MD  PROAIR HFA 108 (90 BASE) MCG/ACT inhaler INHALE 2 PUFFS INTO THE LUNGS EVERY 4 HOURS AS NEEDED FOR WHEEZING 03/10/11  Yes Syliva Overman, MD  tiotropium (SPIRIVA HANDIHALER) 18 MCG inhalation capsule Place 18 mcg into inhaler and inhale daily.  09/24/10  Yes Syliva Overman, MD  travoprost, benzalkonium, (TRAVATAN Z) 0.004 % ophthalmic solution Place 1 drop into both eyes at bedtime.    Yes Historical Provider, MD  warfarin (COUMADIN) 4 MG tablet 4-8 mg. Take 8 mg on Tuesday and Friday, 4 mg on Sun, Kelvin Cellar   10/20/10  Yes Gerrit Friends. Dietrich Pates, MD    Past Medical History  Diagnosis Date  . Anemia     Hemoglobin 11.6 in 04/2008->resolved   . Edema     Lower Extremity  . Tobacco abuse   . Glaucoma   . Allergic rhinitis   . Campath-induced atrial fibrillation     Coumadin;asymptomatic bradycardia on telemetry in 12/2009  . CHF (congestive heart failure)     H/o CHF with preserved LV EF; pulmonary hypertension; normal EF in 03/2009  . Hypertension   . Shingles   . Shortness of breath   . COPD (chronic obstructive pulmonary disease)     Past Surgical History  Procedure Date  .  Bilateral cataract surgery   . Herniorrhapy   . Tendon repair     right hand surgical procedure for a tendon repair    Social History:  reports that he has been smoking Cigarettes.  He has a .25 pack-year smoking history. He has never used smokeless tobacco. He reports that he does not drink alcohol or use illicit drugs.  Family History  Problem Relation Age of Onset  . Cancer Mother   . Cancer Father   . Coronary artery disease Brother     Review of Systems:  Constitutional: Denies fever, chills,  diaphoresis, appetite change and fatigue.  HEENT: Denies photophobia, eye pain, redness, hearing loss, ear pain, congestion, sore throat, rhinorrhea, sneezing, mouth sores, trouble swallowing, neck pain, neck stiffness and tinnitus.  Respiratory: as per HPI Cardiovascular: Denies chest pain, palpitations and leg swelling.  Gastrointestinal: Denies nausea, vomiting, abdominal pain, diarrhea, constipation, blood in stool and abdominal distention.  Genitourinary: Denies dysuria, urgency, frequency, hematuria, flank pain and difficulty urinating.  Musculoskeletal: Denies myalgias, back pain, joint swelling, arthralgias and gait problem.  Skin: Denies pallor, rash and wound.  Neurological: Denies dizziness, seizures, syncope, weakness, light-headedness, numbness and headaches.  Hematological: Denies adenopathy. Easy bruising, personal or family bleeding history  Psychiatric/Behavioral: Denies suicidal ideation, mood changes, confusion, nervousness, sleep disturbance and agitation   Physical Exam:  Filed Vitals:   03/18/11 0800 03/18/11 0818 03/18/11 0900 03/18/11 1041  BP: 183/105 161/88 159/83 149/66  Pulse: 56 57 63 96  Temp:      TempSrc:      Resp: 21 24 19 20   SpO2: 100% 98% 99% 98%    Constitutional: Vital signs reviewed.  no acute distress, cooperative with exam. Alert and oriented x3.  Head: Normocephalic and atraumatic Ear: TM normal bilaterally Mouth: no erythema or exudates, MMM Eyes: PERRL, EOMI, conjunctivae normal  Neck: Supple, Trachea midline normal ROM, No JVD, mass, thyromegaly, or carotid bruit present.  Cardiovascular: Irregular rhythm, rate controlled, S1 normal, S2 normal, no MRG, pulses symmetric and intact bilaterally Pulmonary/Chest: expiratory wheezing appreciated over mid and upper lung lobes, inspiratory crackles at bases Abdominal: Soft. Non-tender, non-distended, bowel sounds are normal, no masses, organomegaly, or guarding present.  GU: no CVA  tenderness Musculoskeletal: No joint deformities, erythema, or stiffness, ROM full and no nontender Ext: (+3) lower extremity edema bilaterally Hematology: no cervical, inginal, or axillary adenopathy.  Neurological: A&O x3, Strenght is normal and symmetric bilaterally, cranial nerve II-XII are grossly intact, no focal motor deficit, sensory intact to light touch bilaterally.  Skin: Warm, dry and intact. No rash, cyanosis, or clubbing.  Psychiatric: Normal mood and affect. speech and behavior is normal. Judgment and thought content normal. Cognition and memory are normal.   Labs on Admission:  Results for orders placed during the hospital encounter of 03/18/11 (from the past 48 hour(s))  CBC     Status: Abnormal   Collection Time   03/18/11  6:25 AM      Component Value Range Comment   WBC 8.1  4.0 - 10.5 (K/uL)    RBC 4.47  4.22 - 5.81 (MIL/uL)    Hemoglobin 12.6 (*) 13.0 - 17.0 (g/dL)    HCT 16.1  09.6 - 04.5 (%)    MCV 96.2  78.0 - 100.0 (fL)    MCH 28.2  26.0 - 34.0 (pg)    MCHC 29.3 (*) 30.0 - 36.0 (g/dL)    RDW 40.9  81.1 - 91.4 (%)    Platelets 237  150 -  400 (K/uL)   DIFFERENTIAL     Status: Normal   Collection Time   03/18/11  6:25 AM      Component Value Range Comment   Neutrophils Relative 60  43 - 77 (%)    Neutro Abs 4.9  1.7 - 7.7 (K/uL)    Lymphocytes Relative 27  12 - 46 (%)    Lymphs Abs 2.2  0.7 - 4.0 (K/uL)    Monocytes Relative 11  3 - 12 (%)    Monocytes Absolute 0.9  0.1 - 1.0 (K/uL)    Eosinophils Relative 2  0 - 5 (%)    Eosinophils Absolute 0.2  0.0 - 0.7 (K/uL)    Basophils Relative 0  0 - 1 (%)    Basophils Absolute 0.0  0.0 - 0.1 (K/uL)   COMPREHENSIVE METABOLIC PANEL     Status: Abnormal   Collection Time   03/18/11  6:25 AM      Component Value Range Comment   Sodium 141  135 - 145 (mEq/L)    Potassium 3.8  3.5 - 5.1 (mEq/L)    Chloride 103  96 - 112 (mEq/L)    CO2 34 (*) 19 - 32 (mEq/L)    Glucose, Bld 125 (*) 70 - 99 (mg/dL)    BUN 17  6 - 23  (mg/dL)    Creatinine, Ser 6.04  0.50 - 1.35 (mg/dL)    Calcium 54.0  8.4 - 10.5 (mg/dL)    Total Protein 8.2  6.0 - 8.3 (g/dL)    Albumin 3.9  3.5 - 5.2 (g/dL)    AST 25  0 - 37 (U/L)    ALT 16  0 - 53 (U/L)    Alkaline Phosphatase 97  39 - 117 (U/L)    Total Bilirubin 0.5  0.3 - 1.2 (mg/dL)    GFR calc non Af Amer 83 (*) >90 (mL/min)    GFR calc Af Amer >90  >90 (mL/min)   PRO B NATRIURETIC PEPTIDE     Status: Abnormal   Collection Time   03/18/11  6:25 AM      Component Value Range Comment   Pro B Natriuretic peptide (BNP) 3141.0 (*) 0 - 125 (pg/mL)   PROTIME-INR     Status: Abnormal   Collection Time   03/18/11  6:25 AM      Component Value Range Comment   Prothrombin Time 22.9 (*) 11.6 - 15.2 (seconds)    INR 1.99 (*) 0.00 - 1.49    POCT I-STAT TROPONIN I     Status: Normal   Collection Time   03/18/11  6:30 AM      Component Value Range Comment   Troponin i, poc 0.05  0.00 - 0.08 (ng/mL)    Comment 3              Radiological Exams on Admission: No results found.  Assessment/Plan  Principal Problem:   *ACUTE EXACERBATION OF CHF (CONGESTIVE HEART FAILURE) - likely secondary to missed lasix - As per 2 D ECHO 12/2010 EF ~ 55% - we will start patient of Lasix 40 mg IV BID (Cr 0.81), strict intake and output, daily weight to ensure adequate diuresis - we will stop metoprolol for now as patient also has COPD exacerbation but we will continue Cardizem 120 mg Daily PO - obtain BNP level - cycle cardiac enzymes (total of 3 sets) - follow up TSH level - keep Potassium > 4 and Magnesium > 2 - continue potassium  supplement (home medication)  Active Problems:  ATRIAL FIBRILLATION - currently rate control - patient is on metoprolol and Cardizem at home (would address this at the time of discharge if both beta blocker and calcium channel blocker is necessary) - we will stop metoprolol due to COPD exacerbation but we will continue cardizem for rate control - continue coumadin as  per pharmacy protocol (INR 1.99 on admission)   CHRONIC OBSTRUCTIVE PULMONARY DISEASE, ACUTE EXACERBATION - patient is an active smoker, smokes 1/2 pack a day - we will provide nebulizer treatments with Atrovent and Xopenex Q 6 Hours for shortness of breath and wheezing - as patient was saturating as low as 82% on 2 L nasal canula we will obtain CT chest angio to rule out PE - we will also start solumedrol 60 mg Q 12 Hours IV for at least 24 hours and then perhaps change to PO regimen if patient is clinically better - will start antibiotics for possible pneumonia - provide smoking cessation counseling   COMMUNITY ACQUIRED PNEUMONIA - bacterial versus influenza - follow up 2 sets of blood cultures, influenza PCR - start azithromycin and ceftriaxone IV   ANEMIA-NOS - hemoglobin stable at the time of admission - no blood transfusion warranted at present  EDUCATION - patient is aware of plan of care and treatment - contact information provided for any questions or concerns  DIET - heart healthy  DVT PROPHYLAXIS - therapeutic on coumadin  DISPOSITION - to telemetry   Time Spent on Admission: Greater than 30 minutes  Manson Passey [facility] Pager #: [phone] 03/18/2011, 11:10 AM

## 2011-03-18 NOTE — ED Notes (Signed)
Pt states is feeling better and breathing easier.  Speaking clear sentences.  nad noted.

## 2011-03-18 NOTE — ED Provider Notes (Addendum)
History     CSN: [id number]  Arrival date & time 03/18/11  0612   First MD Initiated Contact with Patient 03/18/11 0622      Chief Complaint  Patient presents with  . Shortness of Breath    (Consider location/radiation/quality/duration/timing/severity/associated sxs/prior treatment) Patient is a 71 y.o. male presenting with shortness of breath. The history is provided by the patient. The history is limited by the condition of the patient.  Shortness of Breath  Associated symptoms include shortness of breath.   patient presents with shortness of breath for the past week. He has been out of his medications for his congestive heart failure for last 4 days. Notes cough which has been nonproductive. Denies any chest pain or chest pressure. No fever noted. Patient does have a history of COPD as well as CHF. Does note some increased lower extremity edema. He is not on home oxygen and no medications were used prior to arrival  Past Medical History  Diagnosis Date  . Anemia     Hemoglobin 11.6 in 04/2008->resolved   . Edema     Lower Extremity  . Tobacco abuse   . Glaucoma   . Allergic rhinitis   . Campath-induced atrial fibrillation     Coumadin;asymptomatic bradycardia on telemetry in 12/2009  . CHF (congestive heart failure)     H/o CHF with preserved LV EF; pulmonary hypertension; normal EF in 03/2009  . Hypertension   . Shingles   . Shortness of breath   . COPD (chronic obstructive pulmonary disease)     Past Surgical History  Procedure Date  . Bilateral cataract surgery   . Herniorrhapy   . Tendon repair     right hand surgical procedure for a tendon repair    Family History  Problem Relation Age of Onset  . Cancer Mother   . Cancer Father   . Coronary artery disease Brother     History  Substance Use Topics  . Smoking status: Current Everyday Smoker -- 0.5 packs/day for .5 years    Types: Cigarettes  . Smokeless tobacco: Never Used  . Alcohol Use: No       Review of Systems  Respiratory: Positive for shortness of breath.   All other systems reviewed and are negative.    Allergies  Review of patient's allergies indicates no known allergies.  Home Medications   Current Outpatient Rx  Name Route Sig Dispense Refill  . ALBUTEROL SULFATE HFA 108 (90 BASE) MCG/ACT IN AERS Inhalation Inhale 2 puffs into the lungs every 4 (four) hours as needed for wheezing. 1 Inhaler 0  . ALBUTEROL SULFATE (2.5 MG/3ML) 0.083% IN NEBU Nebulization Take 2.5 mg by nebulization 3 (three) times daily as needed. For shortness of breath    . BRIMONIDINE TARTRATE-TIMOLOL 0.2-0.5 % OP SOLN Both Eyes Place 1 drop into both eyes every 12 (twelve) hours.      . BUDESONIDE-FORMOTEROL FUMARATE 160-4.5 MCG/ACT IN AERO Inhalation Inhale 2 puffs into the lungs 2 (two) times daily. Needs a refill, please     . CARTIA XT 300 MG PO CP24  TAKE ONE CAPSULE BY MOUTH EVERY DAY 30 capsule 0    Pt. Is overdue for OV and must schedule  . FUROSEMIDE 40 MG PO TABS Oral Take 1 tablet (40 mg total) by mouth 2 (two) times daily. 60 tablet 3  . GABAPENTIN 300 MG PO CAPS Oral Take 1 capsule (300 mg total) by mouth 2 (two) times daily. 60 capsule 2  .  IPRATROPIUM BROMIDE 0.02 % IN SOLN Nebulization Take 2.5 mLs (500 mcg total) by nebulization every 4 (four) hours as needed for wheezing. 75 mL 1  . METOPROLOL SUCCINATE ER 50 MG PO TB24 Oral Take 1 tablet (50 mg total) by mouth daily. 30 tablet 4  . POTASSIUM CHLORIDE CRYS ER 20 MEQ PO TBCR Oral Take 10 mEq by mouth daily.      Marland Kitchen PROAIR HFA 108 (90 BASE) MCG/ACT IN AERS  INHALE 2 PUFFS INTO THE LUNGS EVERY 4 HOURS AS NEEDED FOR WHEEZING 8.5 g 4  . SPIRIVA HANDIHALER 18 MCG IN CAPS  INHALE CONTENTS OF 1 CAPSULE USING HANDIHALER ONCE DAILY 30 capsule 2  . TIOTROPIUM BROMIDE MONOHYDRATE 18 MCG IN CAPS       . TRAVOPROST 0.004 % OP SOLN Both Eyes Place 1 drop into both eyes at bedtime.     . WARFARIN SODIUM 4 MG PO TABS  4-8 mg. Take 8 mg on  Tuesday and Saturday, 4 mg on Sun, Mon,Wed,Thurs,Fri        BP 199/108  Pulse 92  Resp 28  SpO2 99%  Physical Exam  Nursing note and vitals reviewed. Constitutional: He is oriented to person, place, and time. He appears well-developed. He appears toxic. He has a sickly appearance. He appears ill. He appears distressed.  HENT:  Head: Normocephalic and atraumatic.  Eyes: Conjunctivae, EOM and lids are normal. Pupils are equal, round, and reactive to light.  Neck: Normal range of motion. Neck supple. No tracheal deviation present. No mass present.  Cardiovascular: Normal rate, regular rhythm and normal heart sounds.  Exam reveals no gallop.   No murmur heard. Pulmonary/Chest: Accessory muscle usage present. No stridor. Tachypnea noted. He is in respiratory distress. He has decreased breath sounds. He has wheezes. He has no rhonchi. He has no rales.  Abdominal: Soft. Normal appearance and bowel sounds are normal. He exhibits no distension. There is no tenderness. There is no rebound and no CVA tenderness.  Musculoskeletal: Normal range of motion. He exhibits no edema and no tenderness.  Neurological: He is alert and oriented to person, place, and time. He has normal strength. No cranial nerve deficit or sensory deficit. GCS eye subscore is 4. GCS verbal subscore is 5. GCS motor subscore is 6.  Skin: Skin is warm. No abrasion and no rash noted. He is diaphoretic.  Psychiatric: His behavior is normal. His mood appears anxious. His speech is rapid and/or pressured.    ED Course  Procedures (including critical care time)   Labs Reviewed  CBC  DIFFERENTIAL  COMPREHENSIVE METABOLIC PANEL  I-STAT TROPONIN I  PRO B NATRIURETIC PEPTIDE   No results found.   No diagnosis found.    MDM   Date: 03/18/2011  Rate: 83  Rhythm: atrial fibrillation  QRS Axis: left  Intervals: normal  ST/T Wave abnormalities: nonspecific ST changes  Conduction Disutrbances:afib  Narrative  Interpretation:   Old EKG Reviewed: changes noted  7:02 AM Patient given albuterol and with Atrovent & Medrol. Patient's labs and x-rays are pending at this time, patient's out to Dr. Preston Fleeting, who will followup on the patient        Toy Baker, MD 03/18/11 0703  0710: He is feeling somewhat better after an albuterol nebulizer treatment, but on auscultation, he still sounds fairly tight and has considerable wheezing. Exam shows 2-3+ edema, and laboratory workup shows significantly elevated BNP. He had been given a dose of oral Lasix, but likely will need  to be admitted for diuresis.  Results for orders placed during the hospital encounter of 03/18/11  CBC      Component Value Range   WBC 8.1  4.0 - 10.5 (K/uL)   RBC 4.47  4.22 - 5.81 (MIL/uL)   Hemoglobin 12.6 (*) 13.0 - 17.0 (g/dL)   HCT 09.8  11.9 - 14.7 (%)   MCV 96.2  78.0 - 100.0 (fL)   MCH 28.2  26.0 - 34.0 (pg)   MCHC 29.3 (*) 30.0 - 36.0 (g/dL)   RDW 82.9  56.2 - 13.0 (%)   Platelets 237  150 - 400 (K/uL)  DIFFERENTIAL      Component Value Range   Neutrophils Relative 60  43 - 77 (%)   Neutro Abs 4.9  1.7 - 7.7 (K/uL)   Lymphocytes Relative 27  12 - 46 (%)   Lymphs Abs 2.2  0.7 - 4.0 (K/uL)   Monocytes Relative 11  3 - 12 (%)   Monocytes Absolute 0.9  0.1 - 1.0 (K/uL)   Eosinophils Relative 2  0 - 5 (%)   Eosinophils Absolute 0.2  0.0 - 0.7 (K/uL)   Basophils Relative 0  0 - 1 (%)   Basophils Absolute 0.0  0.0 - 0.1 (K/uL)  COMPREHENSIVE METABOLIC PANEL      Component Value Range   Sodium 141  135 - 145 (mEq/L)   Potassium 3.8  3.5 - 5.1 (mEq/L)   Chloride 103  96 - 112 (mEq/L)   CO2 34 (*) 19 - 32 (mEq/L)   Glucose, Bld 125 (*) 70 - 99 (mg/dL)   BUN 17  6 - 23 (mg/dL)   Creatinine, Ser 8.65  0.50 - 1.35 (mg/dL)   Calcium 78.4  8.4 - 10.5 (mg/dL)   Total Protein 8.2  6.0 - 8.3 (g/dL)   Albumin 3.9  3.5 - 5.2 (g/dL)   AST 25  0 - 37 (U/L)   ALT 16  0 - 53 (U/L)   Alkaline Phosphatase 97  39 - 117 (U/L)    Total Bilirubin 0.5  0.3 - 1.2 (mg/dL)   GFR calc non Af Amer 83 (*) >90 (mL/min)   GFR calc Af Amer >90  >90 (mL/min)  PRO B NATRIURETIC PEPTIDE      Component Value Range   Pro B Natriuretic peptide (BNP) 3141.0 (*) 0 - 125 (pg/mL)  PROTIME-INR      Component Value Range   Prothrombin Time 22.9 (*) 11.6 - 15.2 (seconds)   INR 1.99 (*) 0.00 - 1.49   POCT I-STAT TROPONIN I      Component Value Range   Troponin i, poc 0.05  0.00 - 0.08 (ng/mL)   Comment 3            Dg Chest Port 1 View  03/18/2011  *RADIOLOGY REPORT*  Clinical Data: Chest pain, shortness of breath.  PORTABLE CHEST - 1 VIEW  Comparison: 12/24/2010  Findings: Enlarged cardiac contour, similar to prior.  Central vascular congestion. Apical emphysematous changes.  Mild perihilar interstitial prominence.  No pneumothorax.  Multilevel degenerative changes.  No acute osseous abnormality.  IMPRESSION: Cardiomegaly.  Perihilar / interstitial prominence may be chronic or secondary to edema.  Apical emphysematous change.  Original Report Authenticated By: Waneta Martins, M.D.      Dione Booze, MD 03/18/11 6962  9528: Patient is resting comfortably and still maintaining adequate oxygen saturations are, although he continues to be hypertensive with blood pressure 175/112. Case has been discussed  with Dr. Rito Ehrlich who agrees to admit the patient, and requested to be given an extra dose of metoprolol 5 mg IV to try and control his blood pressure. He will be admitted to the telemetry floor.  Impression: Exacerbation of CHF. COPD. Hypertension. Atrial fibrillation.  Dione Booze, MD 03/18/11 [phone]

## 2011-03-18 NOTE — ED Notes (Signed)
Pt ate 100% of breakfast tray.  Pt sitting in chair, watching t.v.  nad noted.

## 2011-03-19 LAB — GLUCOSE, CAPILLARY: Glucose-Capillary: 153 mg/dL — ABNORMAL HIGH (ref 70–99)

## 2011-03-19 LAB — CBC
HCT: 34.9 % — ABNORMAL LOW (ref 39.0–52.0)
MCV: 94.6 fL (ref 78.0–100.0)
Platelets: 199 10*3/uL (ref 150–400)
RBC: 3.69 MIL/uL — ABNORMAL LOW (ref 4.22–5.81)
WBC: 6.5 10*3/uL (ref 4.0–10.5)

## 2011-03-19 LAB — CARDIAC PANEL(CRET KIN+CKTOT+MB+TROPI)
CK, MB: 6.4 ng/mL (ref 0.3–4.0)
Relative Index: 4.6 — ABNORMAL HIGH (ref 0.0–2.5)
Relative Index: 4.8 — ABNORMAL HIGH (ref 0.0–2.5)
Total CK: 157 U/L (ref 7–232)
Total CK: 133 U/L (ref 7–232)

## 2011-03-19 LAB — BASIC METABOLIC PANEL
CO2: 33 mEq/L — ABNORMAL HIGH (ref 19–32)
Chloride: 98 mEq/L (ref 96–112)
GFR calc Af Amer: >90 mL/min (ref >90)
Potassium: 3.8 mEq/L (ref 3.5–5.1)
Sodium: 139 mEq/L (ref 135–145)

## 2011-03-19 LAB — PROTIME-INR: INR: 2.58 — ABNORMAL HIGH (ref 0.00–1.49)

## 2011-03-19 MED ORDER — WARFARIN SODIUM 2 MG PO TABS
4.0000 mg | ORAL_TABLET | Freq: Once | ORAL | Status: AC
  Administered 2011-03-19: 4 mg via ORAL
  Filled 2011-03-19: qty 2

## 2011-03-19 NOTE — Consult Note (Signed)
ANTICOAGULATION CONSULT NOTE   Pharmacy Consult for Warfarin Indication: atrial fibrillation  No Known Allergies  Patient Measurements: Height: 5\' 6"  (167.6 cm) Weight: 161 lb 2.5 oz (73.1 kg) IBW/kg (Calculated) : 63.8    Vital Signs: Temp: 98 F (36.7 C) (01/06 0520) Temp src: Oral (01/06 0520) BP: 173/90 mmHg (01/06 0520) Pulse Rate: 74  (01/06 0520)  Labs:  Basename 03/19/11 0630 03/18/11 2316 03/18/11 1635 03/18/11 1110 03/18/11 0625  HGB 10.9* -- -- 11.4* --  HCT 34.9* -- -- 38.5* 43.0  PLT 199 -- -- 215 237  APTT -- -- -- 34 --  LABPROT 28.1* -- -- 26.8* 22.9*  INR 2.58* -- -- 2.43* 1.99*  HEPARINUNFRC -- -- -- -- --  CREATININE 0.89 -- -- 0.89 0.91  CKTOTAL 133 157 176 -- --  CKMB 6.4* 7.3* 8.2* -- --  TROPONINI <0.30 <0.30 <0.30 -- --   Estimated Creatinine Clearance: 68.7 ml/min (by C-G formula based on Cr of 0.89).  Medical History: Past Medical History  Diagnosis Date  . Anemia     Hemoglobin 11.6 in 04/2008->resolved   . Edema     Lower Extremity  . Tobacco abuse   . Glaucoma   . Allergic rhinitis   . Campath-induced atrial fibrillation     Coumadin;asymptomatic bradycardia on telemetry in 12/2009  . CHF (congestive heart failure)     H/o CHF with preserved LV EF; pulmonary hypertension; normal EF in 03/2009  . Hypertension   . Shingles   . Shortness of breath   . COPD (chronic obstructive pulmonary disease)     Medications:  Prescriptions prior to admission  Medication Sig Dispense Refill  . albuterol (PROVENTIL) (2.5 MG/3ML) 0.083% nebulizer solution Take 2.5 mg by nebulization 3 (three) times daily as needed. For shortness of breath      . brimonidine-timolol (COMBIGAN) 0.2-0.5 % ophthalmic solution Place 1 drop into both eyes every 12 (twelve) hours.        . budesonide-formoterol (SYMBICORT) 160-4.5 MCG/ACT inhaler Inhale 2 puffs into the lungs 2 (two) times daily.       Marland Kitchen CARTIA XT 300 MG 24 hr capsule TAKE ONE CAPSULE BY MOUTH EVERY DAY   30 capsule  0  . furosemide (LASIX) 40 MG tablet Take 1 tablet (40 mg total) by mouth 2 (two) times daily.  60 tablet  3  . gabapentin (NEURONTIN) 300 MG capsule Take 300 mg by mouth 2 (two) times daily. Does not take daily       . metoprolol (TOPROL-XL) 50 MG 24 hr tablet Take 1 tablet (50 mg total) by mouth daily.  30 tablet  4  . potassium chloride SA (K-DUR,KLOR-CON) 20 MEQ tablet Take 10 mEq by mouth daily.        Marland Kitchen PROAIR HFA 108 (90 BASE) MCG/ACT inhaler INHALE 2 PUFFS INTO THE LUNGS EVERY 4 HOURS AS NEEDED FOR WHEEZING  8.5 g  4  . tiotropium (SPIRIVA HANDIHALER) 18 MCG inhalation capsule Place 18 mcg into inhaler and inhale daily.       . travoprost, benzalkonium, (TRAVATAN Z) 0.004 % ophthalmic solution Place 1 drop into both eyes at bedtime.       Marland Kitchen warfarin (COUMADIN) 4 MG tablet 4-8 mg. Take 8 mg on Tuesday and Friday, 4 mg on Sun, Mon,Wed,Thurs,Sat          Assessment: INR therapeutic today   Goal of Therapy:  INR 2-3   Plan: Continue home regimen (4mg  today) INR daily for  now  Valrie Hart A 03/19/2011,8:30 AM

## 2011-03-19 NOTE — Progress Notes (Signed)
Subjective: Reports breathing is improving, no chest pain, minimal cough  Objective: Vital signs in last 24 hours: Temp:  [98 F (36.7 C)-98.1 F (36.7 C)] 98.1 F (36.7 C) (01/06 1400) Pulse Rate:  [65-74] 67  (01/06 1400) Resp:  [20] 20  (01/06 1400) BP: (154-173)/(86-94) 154/86 mmHg (01/06 1400) SpO2:  [95 %-100 %] 95 % (01/06 1407) Weight:  [73.1 kg (161 lb 2.5 oz)] 161 lb 2.5 oz (73.1 kg) (01/06 0520) Weight change:  Last BM Date: 03/17/11  Intake/Output from previous day: 01/05 0701 - 01/06 0700 In: 1160 [P.O.:960; I.V.:200] Out: 3375 [Urine:3375]     Physical Exam: General: Alert, awake, oriented x3, in no acute distress. HEENT: No bruits, no goiter. Heart: Irregular rate and rhythm Lungs: Rhonchi at right base Abdomen: Soft, nontender, nondistended, positive bowel sounds. Extremities: 1-2+ edema bilaterally Neuro: Grossly intact, nonfocal.    Lab Results: Basic Metabolic Panel:  Basename 03/19/11 0630 03/18/11 1110  NA 139 143  K 3.8 4.4  CL 98 101  CO2 33* 34*  GLUCOSE 174* 130*  BUN 18 17  CREATININE 0.89 0.89  CALCIUM 9.6 9.4  MG -- 2.0  PHOS -- 3.2   Liver Function Tests:  Basename 03/18/11 1110 03/18/11 0625  AST 24 25  ALT 17 16  ALKPHOS 87 97  BILITOT 0.4 0.5  PROT 7.2 8.2  ALBUMIN 3.4* 3.9   No results found for this basename: LIPASE:2,AMYLASE:2 in the last 72 hours No results found for this basename: AMMONIA:2 in the last 72 hours CBC:  Basename 03/19/11 0630 03/18/11 1110 03/18/11 0625  WBC 6.5 6.8 --  NEUTROABS -- 6.3 4.9  HGB 10.9* 11.4* --  HCT 34.9* 38.5* --  MCV 94.6 96.7 --  PLT 199 215 --   Cardiac Enzymes:  Basename 03/19/11 0630 03/18/11 2316 03/18/11 1635  CKTOTAL 133 157 176  CKMB 6.4* 7.3* 8.2*  CKMBINDEX -- -- --  TROPONINI <0.30 <0.30 <0.30   BNP:  Basename 03/18/11 1110 03/18/11 0625  PROBNP 3627.0* 3141.0*   D-Dimer: No results found for this basename: DDIMER:2 in the last 72  hours CBG:  Basename 03/19/11 0719  GLUCAP 153*   Hemoglobin A1C: No results found for this basename: HGBA1C in the last 72 hours Fasting Lipid Panel: No results found for this basename: CHOL,HDL,LDLCALC,TRIG,CHOLHDL,LDLDIRECT in the last 72 hours Thyroid Function Tests:  Basename 03/18/11 1110  TSH 3.030  T4TOTAL --  FREET4 --  T3FREE --  THYROIDAB --   Anemia Panel: No results found for this basename: VITAMINB12,FOLATE,FERRITIN,TIBC,IRON,RETICCTPCT in the last 72 hours Coagulation:  Basename 03/19/11 0630 03/18/11 1110  LABPROT 28.1* 26.8*  INR 2.58* 2.43*   Urine Drug Screen: Drugs of Abuse  No results found for this basename: labopia, cocainscrnur, labbenz, amphetmu, thcu, labbarb    Alcohol Level: No results found for this basename: ETH:2 in the last 72 hours  No results found for this or any previous visit (from the past 240 hour(s)).  Studies/Results: Ct Angio Chest W/cm &/or Wo Cm  03/18/2011  *RADIOLOGY REPORT*  Clinical Data:  Shortness of breath.  Chest congestion.  CT ANGIOGRAPHY CHEST WITH CONTRAST  Technique:  Multidetector CT imaging of the chest was performed using the standard protocol during bolus administration of intravenous contrast.  Multiplanar CT image reconstructions including MIPs were obtained to evaluate the vascular anatomy.  Contrast: OMNIPAQUE IOHEXOL 350 MG/ML IV SOLN  Comparison:  12/16/2008 at [facility] and portable chest obtained earlier today.  Findings:  Normally  opacified pulmonary arteries with no pulmonary arterial filling defects.  Bullous changes in both lungs with little change.  Mild patchy peribronchial densities in the right lower lobe with improvement.  Minimal patchy interstitial prominence in the upper lobes, with improvement.  No lung masses. No significant change in mildly prominent mediastinal lymph nodes, including an 8 mm short axis AP window lymph node on image number 37.  Mildly enlarged heart.  Unremarkable  upper abdomen.  Thoracic spine degenerative changes.  Review of the MIP images confirms the above findings.  IMPRESSION:  1.  No pulmonary emboli or acute abnormalities. 2.  COPD. 3.  Improved lung densities, as described above.  Original Report Authenticated By: Darrol Angel, M.D.   Dg Chest Port 1 View  03/18/2011  *RADIOLOGY REPORT*  Clinical Data: Chest pain, shortness of breath.  PORTABLE CHEST - 1 VIEW  Comparison: 12/24/2010  Findings: Enlarged cardiac contour, similar to prior.  Central vascular congestion. Apical emphysematous changes.  Mild perihilar interstitial prominence.  No pneumothorax.  Multilevel degenerative changes.  No acute osseous abnormality.  IMPRESSION: Cardiomegaly.  Perihilar / interstitial prominence may be chronic or secondary to edema.  Apical emphysematous change.  Original Report Authenticated By: Waneta Martins, M.D.    Medications: Scheduled Meds:   . albuterol  2.5 mg Nebulization Q6H  . azithromycin  500 mg Intravenous Q24H  . brimonidine  1 drop Both Eyes Q12H   And  . timolol  1 drop Both Eyes Q12H  . cefTRIAXone (ROCEPHIN)  IV  1 g Intravenous Q24H  . diltiazem  180 mg Oral Daily  . furosemide  40 mg Intravenous BID  . gabapentin  300 mg Oral BID  . ipratropium  0.5 mg Nebulization Q6H  . methylPREDNISolone (SOLU-MEDROL) injection  60 mg Intravenous Q12H  . potassium chloride SA  10 mEq Oral Daily  . sodium chloride      . Travoprost (BAK Free)  1 drop Both Eyes QHS  . warfarin  4 mg Oral ONCE-1800   Continuous Infusions:  PRN Meds:.  Assessment/Plan:  #1. Acute respiratory failure, probably multifactorial by CHF, pneumonia, improving #2 Acute exacerbation of diastolic congestive heart failure. Last echo done in October showed a preserved ejection fraction. Patient reports that he ran out of his Lasix and developed onset of shortness of breath. He is currently diuresing very well with IV Lasix, but still has a fair amount of volume to go. He  still has significant lower extremity edema and shortness of breath. We'll continue him on IV Lasix for now and monitor urine output. #3. Community-acquired pneumonia, found on CT scan. The patient is on ceftriaxone and azithromycin. Likely transition to by mouth tomorrow. #4 COPD. Appears to be stable. No evidence of wheezing. We'll discontinue steroids, cont nebs #5. Atrial fibrillation. Appears to be rate controlled, beta blocker was stopped due to initial wheezing, he is anticoagulated on Coumadin  Disposition. The patient is not ready for discharge yet.   LOS: 1 day   Cecilee Rosner [facility] 03/19/2011, 7:32 PM

## 2011-03-20 LAB — BASIC METABOLIC PANEL
Chloride: 93 mEq/L — ABNORMAL LOW (ref 96–112)
Creatinine, Ser: 0.85 mg/dL (ref 0.50–1.35)
GFR calc Af Amer: >90 mL/min (ref >90)
GFR calc non Af Amer: 86 mL/min — ABNORMAL LOW (ref >90)

## 2011-03-20 LAB — CBC
HCT: 39.1 % (ref 39.0–52.0)
Platelets: 206 10*3/uL (ref 150–400)
RDW: 13.6 % (ref 11.5–15.5)
WBC: 12.6 10*3/uL — ABNORMAL HIGH (ref 4.0–10.5)

## 2011-03-20 LAB — GLUCOSE, CAPILLARY: Glucose-Capillary: 98 mg/dL (ref 70–99)

## 2011-03-20 LAB — PROTIME-INR
INR: 2.81 — ABNORMAL HIGH (ref 0.00–1.49)
Prothrombin Time: 30 seconds — ABNORMAL HIGH (ref 11.6–15.2)

## 2011-03-20 MED ORDER — FUROSEMIDE 40 MG PO TABS: 40.0000 mg | ORAL_TABLET | Freq: Two times a day (BID) | ORAL | Status: DC

## 2011-03-20 MED ORDER — AZITHROMYCIN 500 MG PO TABS: 500.0000 mg | ORAL_TABLET | Freq: Every day | ORAL | Status: AC

## 2011-03-20 MED ORDER — WARFARIN SODIUM 2.5 MG PO TABS: 2.5000 mg | ORAL_TABLET | Freq: Once | ORAL | Status: DC

## 2011-03-20 NOTE — Consult Note (Signed)
ANTICOAGULATION CONSULT NOTE   Pharmacy Consult for Warfarin Indication: atrial fibrillation  No Known Allergies  Patient Measurements: Height: 5\' 6"  (167.6 cm) Weight: 159 lb 9.8 oz (72.4 kg) IBW/kg (Calculated) : 63.8    Vital Signs: Temp: 98.4 F (36.9 C) (01/07 0545) Temp src: Oral (01/07 0545) BP: 187/70 mmHg (01/07 0545) Pulse Rate: 64  (01/07 0545)  Labs:  Basename 03/20/11 0521 03/19/11 0630 03/18/11 2316 03/18/11 1635 03/18/11 1110  HGB 11.8* 10.9* -- -- --  HCT 39.1 34.9* -- -- 38.5*  PLT 206 199 -- -- 215  APTT -- -- -- -- 34  LABPROT 30.0* 28.1* -- -- 26.8*  INR 2.81* 2.58* -- -- 2.43*  HEPARINUNFRC -- -- -- -- --  CREATININE 0.85 0.89 -- -- 0.89  CKTOTAL -- 133 157 176 --  CKMB -- 6.4* 7.3* 8.2* --  TROPONINI -- <0.30 <0.30 <0.30 --   Estimated Creatinine Clearance: 71.9 ml/min (by C-G formula based on Cr of 0.85).  Medical History: Past Medical History  Diagnosis Date  . Anemia     Hemoglobin 11.6 in 04/2008->resolved   . Edema     Lower Extremity  . Tobacco abuse   . Glaucoma   . Allergic rhinitis   . Campath-induced atrial fibrillation     Coumadin;asymptomatic bradycardia on telemetry in 12/2009  . CHF (congestive heart failure)     H/o CHF with preserved LV EF; pulmonary hypertension; normal EF in 03/2009  . Hypertension   . Shingles   . Shortness of breath   . COPD (chronic obstructive pulmonary disease)    Medications:  Prescriptions prior to admission  Medication Sig Dispense Refill  . albuterol (PROVENTIL) (2.5 MG/3ML) 0.083% nebulizer solution Take 2.5 mg by nebulization 3 (three) times daily as needed. For shortness of breath      . brimonidine-timolol (COMBIGAN) 0.2-0.5 % ophthalmic solution Place 1 drop into both eyes every 12 (twelve) hours.        . budesonide-formoterol (SYMBICORT) 160-4.5 MCG/ACT inhaler Inhale 2 puffs into the lungs 2 (two) times daily.       Marland Kitchen CARTIA XT 300 MG 24 hr capsule TAKE ONE CAPSULE BY MOUTH EVERY DAY   30 capsule  0  . furosemide (LASIX) 40 MG tablet Take 1 tablet (40 mg total) by mouth 2 (two) times daily.  60 tablet  3  . gabapentin (NEURONTIN) 300 MG capsule Take 300 mg by mouth 2 (two) times daily. Does not take daily       . metoprolol (TOPROL-XL) 50 MG 24 hr tablet Take 1 tablet (50 mg total) by mouth daily.  30 tablet  4  . potassium chloride SA (K-DUR,KLOR-CON) 20 MEQ tablet Take 10 mEq by mouth daily.        Marland Kitchen PROAIR HFA 108 (90 BASE) MCG/ACT inhaler INHALE 2 PUFFS INTO THE LUNGS EVERY 4 HOURS AS NEEDED FOR WHEEZING  8.5 g  4  . tiotropium (SPIRIVA HANDIHALER) 18 MCG inhalation capsule Place 18 mcg into inhaler and inhale daily.       . travoprost, benzalkonium, (TRAVATAN Z) 0.004 % ophthalmic solution Place 1 drop into both eyes at bedtime.       Marland Kitchen warfarin (COUMADIN) 4 MG tablet 4-8 mg. Take 8 mg on Tuesday and Friday, 4 mg on Sun, Mon,Wed,Thurs,Sat         Assessment: INR therapeutic and trending up  Goal of Therapy:  INR 2-3   Plan: Coumadin 2.5mg  today INR daily for now  [place],  Michal Strzelecki A 03/20/2011,8:41 AM

## 2011-03-20 NOTE — Discharge Summary (Signed)
Physician Discharge Summary  Patient ID: Theodore [name] MRN: [id number] DOB/AGE: [id number] y.o. Primary Care Physician:Margaret Lodema Hong, MD, MD Admit date: 03/18/2011 Discharge date: 03/20/2011    Discharge Diagnoses:  1. Acute exacerbation of congestive heart failure, improved. 2. COPD with exacerbation, resolving. 3. Atrial fibrillation, on Coumadin. Therapeutic INR. 4. Anemia of chronic disease, can have outpatient investigation for this if necessary.   Current Discharge Medication List    START taking these medications   Details  azithromycin (ZITHROMAX) 500 MG tablet Take 1 tablet (500 mg total) by mouth daily. Qty: 5 tablet, Refills: 1      CONTINUE these medications which have CHANGED   Details  furosemide (LASIX) 40 MG tablet Take 1 tablet (40 mg total) by mouth 2 (two) times daily. Qty: 60 tablet, Refills: 1   Associated Diagnoses: Leg edema      CONTINUE these medications which have NOT CHANGED   Details  albuterol (PROVENTIL) (2.5 MG/3ML) 0.083% nebulizer solution Take 2.5 mg by nebulization 3 (three) times daily as needed. For shortness of breath    brimonidine-timolol (COMBIGAN) 0.2-0.5 % ophthalmic solution Place 1 drop into both eyes every 12 (twelve) hours.      budesonide-formoterol (SYMBICORT) 160-4.5 MCG/ACT inhaler Inhale 2 puffs into the lungs 2 (two) times daily.     CARTIA XT 300 MG 24 hr capsule TAKE ONE CAPSULE BY MOUTH EVERY DAY Qty: 30 capsule, Refills: 0    gabapentin (NEURONTIN) 300 MG capsule Take 300 mg by mouth 2 (two) times daily. Does not take daily     potassium chloride SA (K-DUR,KLOR-CON) 20 MEQ tablet Take 10 mEq by mouth daily.      PROAIR HFA 108 (90 BASE) MCG/ACT inhaler INHALE 2 PUFFS INTO THE LUNGS EVERY 4 HOURS AS NEEDED FOR WHEEZING Qty: 8.5 g, Refills: 4    tiotropium (SPIRIVA HANDIHALER) 18 MCG inhalation capsule Place 18 mcg into inhaler and inhale daily.     travoprost, benzalkonium, (TRAVATAN Z) 0.004 % ophthalmic  solution Place 1 drop into both eyes at bedtime.     warfarin (COUMADIN) 4 MG tablet 4-8 mg. Take 8 mg on Tuesday and Friday, 4 mg on Sun, Mon,Wed,Thurs,Sat        STOP taking these medications     metoprolol (TOPROL-XL) 50 MG 24 hr tablet         Discharged Condition: Improved and stable.    Consults: None.  Significant Diagnostic Studies: Ct Angio Chest W/cm &/or Wo Cm  03/18/2011  *RADIOLOGY REPORT*  Clinical Data:  Shortness of breath.  Chest congestion.  CT ANGIOGRAPHY CHEST WITH CONTRAST  Technique:  Multidetector CT imaging of the chest was performed using the standard protocol during bolus administration of intravenous contrast.  Multiplanar CT image reconstructions including MIPs were obtained to evaluate the vascular anatomy.  Contrast: OMNIPAQUE IOHEXOL 350 MG/ML IV SOLN  Comparison:  12/16/2008 at [facility] and portable chest obtained earlier today.  Findings:  Normally opacified pulmonary arteries with no pulmonary arterial filling defects.  Bullous changes in both lungs with little change.  Mild patchy peribronchial densities in the right lower lobe with improvement.  Minimal patchy interstitial prominence in the upper lobes, with improvement.  No lung masses. No significant change in mildly prominent mediastinal lymph nodes, including an 8 mm short axis AP window lymph node on image number 37.  Mildly enlarged heart.  Unremarkable upper abdomen.  Thoracic spine degenerative changes.  Review of the MIP images confirms the above findings.  IMPRESSION:  1.  No pulmonary emboli or acute abnormalities. 2.  COPD. 3.  Improved lung densities, as described above.  Original Report Authenticated By: Darrol Angel, M.D.   Dg Chest Port 1 View  03/18/2011  *RADIOLOGY REPORT*  Clinical Data: Chest pain, shortness of breath.  PORTABLE CHEST - 1 VIEW  Comparison: 12/24/2010  Findings: Enlarged cardiac contour, similar to prior.  Central vascular congestion. Apical  emphysematous changes.  Mild perihilar interstitial prominence.  No pneumothorax.  Multilevel degenerative changes.  No acute osseous abnormality.  IMPRESSION: Cardiomegaly.  Perihilar / interstitial prominence may be chronic or secondary to edema.  Apical emphysematous change.  Original Report Authenticated By: Waneta Martins, M.D.    Lab Results: Basic Metabolic Panel:  Basename 03/20/11 0521 03/19/11 0630 03/18/11 1110  NA 137 139 --  K 3.8 3.8 --  CL 93* 98 --  CO2 38* 33* --  GLUCOSE 118* 174* --  BUN 20 18 --  CREATININE 0.85 0.89 --  CALCIUM 10.0 9.6 --  MG -- -- 2.0  PHOS -- -- 3.2   Liver Function Tests:  [facility] 03/18/11 1110 03/18/11 0625  AST 24 25  ALT 17 16  ALKPHOS 87 97  BILITOT 0.4 0.5  PROT 7.2 8.2  ALBUMIN 3.4* 3.9     CBC:  Basename 03/20/11 0521 03/19/11 0630 03/18/11 1110 03/18/11 0625  WBC 12.6* 6.5 -- --  NEUTROABS -- -- 6.3 4.9  HGB 11.8* 10.9* -- --  HCT 39.1 34.9* -- --  MCV 95.4 94.6 -- --  PLT 206 199 -- --       Hospital Course: This for a pleasant 71 year old man was admitted with a history of dyspnea. He had ran-out of his Lasix that he takes twice a day 40 mg. He also had a cough productive of whitish sputum. He denied any significant chest pain. During his hospitalization he was treated with intravenous diuretics which improved his symptoms significantly. His chest x-ray did not show gross pulmonary edema. He did have a CT scan of his chest looking for pulmonary embolism. A CT scan of the chest was negative for pulmonary embolism and showed improved lung densities from previous CT scans. Chest x-ray showed cardiomegaly and perihilar and interstitial prominence which was likely some edema. Today, he is feeling well although still appears to have a cough.  Discharge Exam: Blood pressure 187/70, pulse 64, temperature 98.4 F (36.9 C), temperature source Oral, resp. rate 20, height 5\' 6"  (1.676 m), weight 72.4 kg (159 lb 9.8 oz), SpO2  94.00%. He looks systemically well. There is no increased work of breathing. There is no peripheral central cyanosis. He is saturating 94% on room air. Lung fields show a few crackles at both bases. There is no wheezing, or bronchial breathing. Heart sounds are present and normal without murmurs. Jugular venous pressure not elevated. He does have some peripheral pitting edema of his legs but this is not particularly significant. He is alert and orientated without a focal neurological signs.  Disposition: Home. I've given a prescription for Lasix 40 mg twice a day with one refill to make sure that he has a supply of this. He is also going to be sent home with Zithromax antibiotic for further 5 day course. He will followup with his primary care physician as previously planned.  Discharge Orders    Future Appointments: Provider: Department: Dept Phone: Center:   05/11/2011 3:15 PM Syliva Overman, MD [facility] [phone] [facility]  Future Orders Please Complete By Expires   Diet - low sodium heart healthy      Increase activity slowly         Follow-up Information    Follow up with Syliva Overman, MD .         Signed: Wilson Singer Pager [phone]  03/20/2011, 10:14 AM

## 2011-03-20 NOTE — Progress Notes (Signed)
UR Chart Review Completed  

## 2011-03-24 ENCOUNTER — Encounter (HOSPITAL_COMMUNITY): Payer: Self-pay | Admitting: Emergency Medicine

## 2011-03-27 ENCOUNTER — Other Ambulatory Visit: Payer: Self-pay | Admitting: Adult Health

## 2011-04-03 ENCOUNTER — Ambulatory Visit (INDEPENDENT_AMBULATORY_CARE_PROVIDER_SITE_OTHER): Payer: Medicare Other | Admitting: Family Medicine

## 2011-04-03 ENCOUNTER — Encounter: Payer: Self-pay | Admitting: Family Medicine

## 2011-04-03 VITALS — BP 132/70 | HR 62 | Resp 16 | Ht 67.5 in | Wt 159.4 lb

## 2011-04-03 DIAGNOSIS — I4891 Unspecified atrial fibrillation: Secondary | ICD-10-CM

## 2011-04-03 DIAGNOSIS — I509 Heart failure, unspecified: Secondary | ICD-10-CM

## 2011-04-03 DIAGNOSIS — Z720 Tobacco use: Secondary | ICD-10-CM

## 2011-04-03 DIAGNOSIS — I10 Essential (primary) hypertension: Secondary | ICD-10-CM

## 2011-04-03 DIAGNOSIS — J441 Chronic obstructive pulmonary disease with (acute) exacerbation: Secondary | ICD-10-CM

## 2011-04-03 DIAGNOSIS — F17200 Nicotine dependence, unspecified, uncomplicated: Secondary | ICD-10-CM

## 2011-04-03 MED ORDER — POTASSIUM CHLORIDE CRYS ER 20 MEQ PO TBCR: 10.0000 meq | EXTENDED_RELEASE_TABLET | Freq: Two times a day (BID) | ORAL | Status: DC

## 2011-04-03 MED ORDER — WARFARIN SODIUM 4 MG PO TABS: ORAL_TABLET | ORAL | Status: DC

## 2011-04-03 MED ORDER — TIOTROPIUM BROMIDE MONOHYDRATE 18 MCG IN CAPS: 18.0000 ug | ORAL_CAPSULE | Freq: Every day | RESPIRATORY_TRACT | Status: DC

## 2011-04-03 MED ORDER — CARVEDILOL 3.125 MG PO TABS: 3.1250 mg | ORAL_TABLET | Freq: Two times a day (BID) | ORAL | Status: DC

## 2011-04-03 MED ORDER — DILTIAZEM HCL ER COATED BEADS 300 MG PO CP24: ORAL_CAPSULE | ORAL | Status: DC

## 2011-04-03 NOTE — Patient Instructions (Signed)
For your heart- take the new medication Coreg 1 tablet twice a day For your water pill- take 1 tablet twice a day, take 1/2 tablet of potassium with each dose of water pill Continue your inhalers Congratulations on cutting back on the tobacco. Call and get a follow-up appt for the coumadin clinic F/U with Dr. Lodema Hong in 2 months

## 2011-04-03 NOTE — Assessment & Plan Note (Signed)
INR at goal, coumadin refilled for 30 days, pt to f/u with coumadin clinic

## 2011-04-03 NOTE — Assessment & Plan Note (Signed)
Switch Metoprolol to Coreg, as more cardioselective with his COPD

## 2011-04-03 NOTE — Assessment & Plan Note (Signed)
Pt improving from recent exacerbation, he was out of his meds at that time. He is to restart Lasix twice a day, potassium will be increased with lasix as he has had muscle cramps in the past

## 2011-04-03 NOTE — Assessment & Plan Note (Signed)
Pt cutting back, continue to encourage cessation

## 2011-04-03 NOTE — Assessment & Plan Note (Signed)
Improvement s/p admission and antibiotics. I think his SOB currently is more CHF

## 2011-04-03 NOTE — Progress Notes (Signed)
  Subjective:    Patient ID: Theodore [name], male    DOB: Jan 11, 1941, 71 y.o.   MRN: [id number]  HPI PT admitted secondary to CHF exacerbation and COPD on 1/5-1/7, sent home on Lasix 40mg  BID and Azithromycin. Metoprolol was discontinued . He has only been taking lasix once a day. His cardiologist is Dr. Dietrich Pates  COPD- breathing improved currently on albuterol, symbicort and Spiriva, he has cut back to 2-3 cig a day.  Afib- pt missed coumadin appt, needs refill, INR at discharge was at goal between 2 and 3     Review of Systems  GEN- denies fatigue, fever, weight loss,weakness, +recent illness HEENT- denies eye drainage, change in vision, nasal discharge, CVS- denies chest pain, palpitations RESP- + SOB, +cough, wheeze ABD- denies N/V, change in stools, abd pain MSK- denies joint pain, muscle aches, injury Neuro- denies headache, dizziness, syncope, seizure activity      Objective:   Physical Exam GEN- NAD, alert and oriented x3 HEENT- MMM, oropharynx clear Neck- Supple, no JVD CVS- RRR, no murmur RESP-bibasilar crackles EXT- Trace edema bilaterally  Pulses- Radial, DP- 2+        Assessment & Plan:

## 2011-04-18 ENCOUNTER — Other Ambulatory Visit: Payer: Self-pay | Admitting: Family Medicine

## 2011-05-11 ENCOUNTER — Ambulatory Visit (INDEPENDENT_AMBULATORY_CARE_PROVIDER_SITE_OTHER): Payer: Medicare Other | Admitting: Family Medicine

## 2011-05-11 ENCOUNTER — Encounter: Payer: Self-pay | Admitting: Family Medicine

## 2011-05-11 VITALS — BP 124/74 | HR 62 | Resp 16 | Ht 67.5 in | Wt 160.1 lb

## 2011-05-11 DIAGNOSIS — R609 Edema, unspecified: Secondary | ICD-10-CM

## 2011-05-11 DIAGNOSIS — Z720 Tobacco use: Secondary | ICD-10-CM

## 2011-05-11 DIAGNOSIS — R972 Elevated prostate specific antigen [PSA]: Secondary | ICD-10-CM

## 2011-05-11 DIAGNOSIS — J40 Bronchitis, not specified as acute or chronic: Secondary | ICD-10-CM

## 2011-05-11 DIAGNOSIS — Z13220 Encounter for screening for lipoid disorders: Secondary | ICD-10-CM

## 2011-05-11 DIAGNOSIS — I4891 Unspecified atrial fibrillation: Secondary | ICD-10-CM

## 2011-05-11 DIAGNOSIS — I10 Essential (primary) hypertension: Secondary | ICD-10-CM

## 2011-05-11 DIAGNOSIS — Z125 Encounter for screening for malignant neoplasm of prostate: Secondary | ICD-10-CM

## 2011-05-11 DIAGNOSIS — F17200 Nicotine dependence, unspecified, uncomplicated: Secondary | ICD-10-CM

## 2011-05-11 MED ORDER — POTASSIUM CHLORIDE ER 10 MEQ PO TBCR: 10.0000 meq | EXTENDED_RELEASE_TABLET | Freq: Every day | ORAL | Status: DC

## 2011-05-11 MED ORDER — PENICILLIN V POTASSIUM 500 MG PO TABS
500.0000 mg | ORAL_TABLET | Freq: Three times a day (TID) | ORAL | Status: AC
Start: 2011-05-11 — End: 2011-05-21

## 2011-05-11 NOTE — Patient Instructions (Addendum)
F/u in 4 month.   Penicillin is sent in for your cough and chest congestion.  New potassium tablet ONE daily  PSA today, you will need to f/u with the urologist we will call with the appt after the lab is available  Copy of PSA will be sent to Dr Jerre Simon  Stop gabapentin  Fasting lipid and chem 7 in 4 months , before next visit.  Please stop smoking

## 2011-05-12 LAB — PSA, MEDICARE: PSA: 6.35 ng/mL — ABNORMAL HIGH (ref <=4.00)

## 2011-05-14 NOTE — Assessment & Plan Note (Signed)
Chronic right lower ext edema , anti coagullated, likely due to venous insufficiency, py reports swelling resolves with leg elevation

## 2011-05-14 NOTE — Assessment & Plan Note (Signed)
Has seen urologist in past I believe and is lost to follow up, pt has n recollection of who he saw and when, will rept lab and refer back

## 2011-05-14 NOTE — Assessment & Plan Note (Signed)
Acute symptoms, antibiotic prescribed 

## 2011-05-14 NOTE — Assessment & Plan Note (Signed)
Rate currently controlled, maintained on coumadin

## 2011-05-14 NOTE — Progress Notes (Signed)
  Subjective:    Patient ID: Theodore [name], male    DOB: May 13, 1940, 71 y.o.   MRN: [id number]  HPI The PT is here for follow up and re-evaluation of chronic medical conditions, medication management and review of any available recent lab and radiology data.  Preventive health is updated, specifically  Cancer screening and Immunization.   Questions or concerns regarding consultations or procedures which the PT has had in the interim are  addressed. The PT denies any adverse reactions to current medications since the last visit.  3 week h/o chest congestion and cough with wheeze, which is wprsening, sputum is brown, no fever, intermittent chills  C/o right lower ext swelling which resolves on leg elevation      Review of Systems See HPI  Denies sinus pressure, nasal congestion, ear pain or sore throat.  Denies chest pains, palpitations and leg swelling Denies abdominal pain, nausea, vomiting,diarrhea or constipation.   Denies dysuria, frequency, hesitancy or incontinence. Denies joint pain, swelling and limitation in mobility. Denies headaches, seizures, numbness, or tingling. Denies depression, anxiety or insomnia. Denies skin break down or rash.        Objective:   Physical Exam Patient alert and oriented and in no cardiopulmonary distress.  HEENT: No facial asymmetry, EOMI, no sinus tenderness,  oropharynx pink and moist.  Neck supple no adenopathy.  Chest: decreased air entry, bilateral crackles and wheezes CVS: S1, S2 no murmurs, no S3.  ABD: Soft non tender. Bowel sounds normal.  Ext: mild right lower ext edema  MS: Adequate ROM spine, shoulders, hips and knees.  Skin: Intact, no ulcerations or rash noted.  Psych: Good eye contact, normal affect. Memory intact not anxious or depressed appearing.  CNS: CN 2-12 intact, power, tone and sensation normal throughout.        Assessment & Plan:

## 2011-05-14 NOTE — Assessment & Plan Note (Signed)
Controlled, no change in medication  

## 2011-05-14 NOTE — Assessment & Plan Note (Signed)
Cessation counseling done, no quit date set 

## 2011-05-24 ENCOUNTER — Ambulatory Visit (INDEPENDENT_AMBULATORY_CARE_PROVIDER_SITE_OTHER): Payer: Medicare Other | Admitting: *Deleted

## 2011-05-24 DIAGNOSIS — Z7901 Long term (current) use of anticoagulants: Secondary | ICD-10-CM

## 2011-05-24 DIAGNOSIS — I4891 Unspecified atrial fibrillation: Secondary | ICD-10-CM

## 2011-05-24 LAB — POCT INR: INR: 1.3

## 2011-05-24 MED ORDER — WARFARIN SODIUM 4 MG PO TABS: ORAL_TABLET | ORAL | Status: DC

## 2011-06-07 ENCOUNTER — Ambulatory Visit (INDEPENDENT_AMBULATORY_CARE_PROVIDER_SITE_OTHER): Payer: Medicare Other | Admitting: *Deleted

## 2011-06-07 DIAGNOSIS — I4891 Unspecified atrial fibrillation: Secondary | ICD-10-CM

## 2011-06-07 DIAGNOSIS — Z7901 Long term (current) use of anticoagulants: Secondary | ICD-10-CM

## 2011-06-07 LAB — POCT INR: INR: 2.5

## 2011-06-28 ENCOUNTER — Ambulatory Visit (INDEPENDENT_AMBULATORY_CARE_PROVIDER_SITE_OTHER): Payer: Medicare Other | Admitting: *Deleted

## 2011-06-28 DIAGNOSIS — I4891 Unspecified atrial fibrillation: Secondary | ICD-10-CM

## 2011-06-28 DIAGNOSIS — Z7901 Long term (current) use of anticoagulants: Secondary | ICD-10-CM

## 2011-07-19 ENCOUNTER — Other Ambulatory Visit: Payer: Self-pay | Admitting: Family Medicine

## 2011-07-19 ENCOUNTER — Other Ambulatory Visit: Payer: Self-pay

## 2011-08-03 ENCOUNTER — Other Ambulatory Visit: Payer: Self-pay

## 2011-08-03 MED ORDER — ALBUTEROL SULFATE HFA 108 (90 BASE) MCG/ACT IN AERS: 2.0000 | INHALATION_SPRAY | RESPIRATORY_TRACT | Status: DC | PRN

## 2011-09-07 ENCOUNTER — Other Ambulatory Visit: Payer: Self-pay | Admitting: Family Medicine

## 2011-09-12 ENCOUNTER — Ambulatory Visit: Payer: Medicare Other | Admitting: Family Medicine

## 2011-09-15 ENCOUNTER — Encounter: Payer: Self-pay | Admitting: Family Medicine

## 2011-09-16 ENCOUNTER — Other Ambulatory Visit: Payer: Self-pay | Admitting: Family Medicine

## 2011-10-05 ENCOUNTER — Other Ambulatory Visit: Payer: Self-pay

## 2011-10-05 MED ORDER — BUDESONIDE-FORMOTEROL FUMARATE 160-4.5 MCG/ACT IN AERO: 2.0000 | INHALATION_SPRAY | Freq: Two times a day (BID) | RESPIRATORY_TRACT | Status: DC

## 2011-10-11 ENCOUNTER — Telehealth: Payer: Self-pay

## 2011-10-11 DIAGNOSIS — Z79899 Other long term (current) drug therapy: Secondary | ICD-10-CM

## 2011-10-11 NOTE — Telephone Encounter (Signed)
I will do this one time this time, so order pls. He has  Not kept his appt here , needs to come in, would be good if she/[facility] representative comes with him and brings med he is taking, this needs to be scheduled

## 2011-10-12 ENCOUNTER — Other Ambulatory Visit: Payer: Self-pay | Admitting: Family Medicine

## 2011-10-13 ENCOUNTER — Encounter (HOSPITAL_COMMUNITY): Payer: Self-pay | Admitting: *Deleted

## 2011-10-13 ENCOUNTER — Emergency Department (HOSPITAL_COMMUNITY): Payer: Medicare Other

## 2011-10-13 ENCOUNTER — Emergency Department (HOSPITAL_COMMUNITY)
Admission: EM | Admit: 2011-10-13 | Discharge: 2011-10-13 | Disposition: A | Discharge status: Home / Self Care | Payer: Medicare Other | Attending: Emergency Medicine | Admitting: Emergency Medicine

## 2011-10-13 DIAGNOSIS — I4891 Unspecified atrial fibrillation: Secondary | ICD-10-CM | POA: Insufficient documentation

## 2011-10-13 DIAGNOSIS — J4489 Other specified chronic obstructive pulmonary disease: Secondary | ICD-10-CM | POA: Insufficient documentation

## 2011-10-13 DIAGNOSIS — N509 Disorder of male genital organs, unspecified: Secondary | ICD-10-CM | POA: Insufficient documentation

## 2011-10-13 DIAGNOSIS — R0602 Shortness of breath: Secondary | ICD-10-CM | POA: Insufficient documentation

## 2011-10-13 DIAGNOSIS — F17200 Nicotine dependence, unspecified, uncomplicated: Secondary | ICD-10-CM | POA: Insufficient documentation

## 2011-10-13 DIAGNOSIS — S2092XA Blister (nonthermal) of unspecified parts of thorax, initial encounter: Secondary | ICD-10-CM | POA: Insufficient documentation

## 2011-10-13 DIAGNOSIS — Z7901 Long term (current) use of anticoagulants: Secondary | ICD-10-CM | POA: Insufficient documentation

## 2011-10-13 DIAGNOSIS — J449 Chronic obstructive pulmonary disease, unspecified: Secondary | ICD-10-CM | POA: Insufficient documentation

## 2011-10-13 DIAGNOSIS — L0390 Cellulitis, unspecified: Secondary | ICD-10-CM

## 2011-10-13 DIAGNOSIS — L0291 Cutaneous abscess, unspecified: Secondary | ICD-10-CM

## 2011-10-13 DIAGNOSIS — S30823A Blister (nonthermal) of scrotum and testes, initial encounter: Secondary | ICD-10-CM

## 2011-10-13 DIAGNOSIS — I10 Essential (primary) hypertension: Secondary | ICD-10-CM | POA: Insufficient documentation

## 2011-10-13 DIAGNOSIS — Z79899 Other long term (current) drug therapy: Secondary | ICD-10-CM | POA: Insufficient documentation

## 2011-10-13 DIAGNOSIS — I509 Heart failure, unspecified: Secondary | ICD-10-CM | POA: Insufficient documentation

## 2011-10-13 DIAGNOSIS — X838XXA Intentional self-harm by other specified means, initial encounter: Secondary | ICD-10-CM | POA: Insufficient documentation

## 2011-10-13 LAB — CBC WITH DIFFERENTIAL/PLATELET
Basophils Absolute: 0 10*3/uL (ref 0.0–0.1)
Eosinophils Absolute: 0.2 10*3/uL (ref 0.0–0.7)
Eosinophils Relative: 3 % (ref 0–5)
MCH: 28.1 pg (ref 26.0–34.0)
MCHC: 31.1 g/dL (ref 30.0–36.0)
MCV: 90.5 fL (ref 78.0–100.0)
Monocytes Absolute: 0.7 10*3/uL (ref 0.1–1.0)
Platelets: 259 10*3/uL (ref 150–400)
RDW: 13.5 % (ref 11.5–15.5)

## 2011-10-13 LAB — BASIC METABOLIC PANEL
Calcium: 9.5 mg/dL (ref 8.4–10.5)
Creatinine, Ser: 0.83 mg/dL (ref 0.50–1.35)
GFR calc non Af Amer: 86 mL/min — ABNORMAL LOW (ref >90)
Sodium: 138 mEq/L (ref 135–145)

## 2011-10-13 MED ORDER — VANCOMYCIN HCL IN DEXTROSE 1-5 GM/200ML-% IV SOLN
1000.0000 mg | Freq: Once | INTRAVENOUS | Status: AC
  Administered 2011-10-13: 1000 mg via INTRAVENOUS
  Filled 2011-10-13: qty 200

## 2011-10-13 MED ORDER — CEPHALEXIN 500 MG PO CAPS: 500.0000 mg | ORAL_CAPSULE | Freq: Two times a day (BID) | ORAL | Status: AC

## 2011-10-13 MED ORDER — SILVER SULFADIAZINE 1 % EX CREA: TOPICAL_CREAM | Freq: Two times a day (BID) | CUTANEOUS | Status: DC

## 2011-10-13 NOTE — ED Notes (Signed)
Patient would like something to drink. MD aware.

## 2011-10-13 NOTE — Consult Note (Signed)
Subjective: Theodore [name] is a 71 yo BM who is on coumadin for afib.  He scratched his scrotum a few weeks ago and had trouble controlling the bleeding so he applied a rubber band tourniquet which helped.  He did it again a week ago but left the rubber band on over night.   Since he removed the rubber band he has developed a large 4-5cm lesion on the lower left scrotum that is weeping but minimally painful and he has been managing it with neosporin and dressings.  He has had no fevers or voiding complaints.   ROS: Negative except as above and he has chronic intermittant shortness of breath.    Active Ambulatory Problems    Diagnosis Date Noted  . GOUT 11/26/2009  . ANEMIA-NOS 02/13/2006  . ERECTILE DYSFUNCTION 05/21/2006  . HYPERTENSION 02/13/2006  . Atrial fibrillation 02/13/2006  . Acute exacerbation of CHF (congestive heart failure) 05/03/2006  . ALLERGIC RHINITIS 02/13/2006  . CHRONIC OBSTRUCTIVE PULMONARY DISEASE, ACUTE EXACERBATION 05/11/2006  . INGUINAL HERNIA, LEFT 01/24/2010  . LOW BACK PAIN, ACUTE 05/08/2008  . TRIGGER FINGER 10/01/2008  . EDEMA LEG 05/11/2006  . Encounter for long-term (current) use of other medications 05/15/2010  . Special screening for malignant neoplasms, colon 05/15/2010  . Need for prophylactic vaccination and inoculation against influenza 05/15/2010  . Long term current use of anticoagulant 06/08/2010  . Shingles 09/14/2010  . Tobacco abuse disorder 12/24/2010  . Community acquired pneumonia 03/18/2011  . Bronchitis 05/11/2011  . Elevated PSA 05/11/2011   Resolved Ambulatory Problems    Diagnosis Date Noted  . COPD 12/22/2009  . INSOMNIA 06/22/2006  . DYSPNEA 02/26/2008  . Cough 07/24/2007  . ABDOMINAL PAIN 05/03/2006  . NOT TAKING MEDICATION 03/17/2008  . ELEVATED PROSTATE SPECIFIC ANTIGEN 04/26/2010  . Shingles   . Bradycardia 12/25/2010   Past Medical History  Diagnosis Date  . Anemia   . Tobacco abuse   . Glaucoma   . Allergic  rhinitis   . CHF (congestive heart failure)   . Hypertension   . COPD (chronic obstructive pulmonary disease)    Current facility-administered medications:vancomycin (VANCOCIN) IVPB 1000 mg/200 mL premix, 1,000 mg, Intravenous, Once, Joya Gaskins, MD, 1,000 mg at 10/13/11 1359 Current outpatient prescriptions:albuterol (PROAIR HFA) 108 (90 BASE) MCG/ACT inhaler, Inhale 2 puffs into the lungs every 4 (four) hours as needed for wheezing., Disp: 8.5 g, Rfl: 4;  albuterol (PROVENTIL) (2.5 MG/3ML) 0.083% nebulizer solution, INHALE 1 VIAL VIA NEBULIZER EVERY 6 HOURS AS NEEDED, Disp: 375 mL, Rfl: 2 brimonidine-timolol (COMBIGAN) 0.2-0.5 % ophthalmic solution, Place 1 drop into both eyes every 12 (twelve) hours.  , Disp: , Rfl: ;  budesonide-formoterol (SYMBICORT) 160-4.5 MCG/ACT inhaler, Inhale 2 puffs into the lungs 2 (two) times daily., Disp: 1 Inhaler, Rfl: 3;  CARTIA XT 300 MG 24 hr capsule, TAKE 1 CAPSULE BY MOUTH EVERY DAY, Disp: 30 capsule, Rfl: 2 carvedilol (COREG) 3.125 MG tablet, Take 1 tablet (3.125 mg total) by mouth 2 (two) times daily., Disp: 60 tablet, Rfl: 3;  furosemide (LASIX) 40 MG tablet, Take 40 mg by mouth every morning., Disp: , Rfl: ;  potassium chloride (KLOR-CON 10) 10 MEQ tablet, Take 1 tablet (10 mEq total) by mouth daily after breakfast., Disp: 30 tablet, Rfl: 5 tiotropium (SPIRIVA HANDIHALER) 18 MCG inhalation capsule, Place 1 capsule (18 mcg total) into inhaler and inhale daily., Disp: 30 capsule, Rfl: 6;  travoprost, benzalkonium, (TRAVATAN Z) 0.004 % ophthalmic solution, Place 1 drop into  both eyes at bedtime. , Disp: , Rfl:  warfarin (COUMADIN) 4 MG tablet, Take 4-8 mg by mouth daily. Take 2 tablets (8 mg) on Tuesdays and Fridays, then take 1 tablet (4 mg) on Sundays, Mondays,Wednesdays,Thursdays,Saturdays, Disp: , Rfl: ;  DISCONTD: furosemide (LASIX) 40 MG tablet, Take 1 tablet (40 mg total) by mouth 2 (two) times daily., Disp: 60 tablet, Rfl: 1 DISCONTD: warfarin  (COUMADIN) 4 MG tablet, Take 8 mg on Tuesday and Friday, 4 mg on Sun, Mon,Wed,Thurs,Sat, Disp: 45 tablet, Rfl: 3  History  Substance Use Topics  . Smoking status: Current Everyday Smoker -- 0.5 packs/day for .5 years    Types: Cigarettes  . Smokeless tobacco: Never Used  . Alcohol Use: No   Family History  Problem Relation Age of Onset  . Cancer Mother   . Cancer Father   . Coronary artery disease Brother     Objective: Vital signs in last 24 hours: Temp:  [98.1 F (36.7 C)] 98.1 F (36.7 C) (08/02 1006) Pulse Rate:  [65-70] 65  (08/02 1230) Resp:  [20-22] 22  (08/02 1230) BP: (147-184)/(80-103) 147/80 mmHg (08/02 1230) SpO2:  [95 %-98 %] 98 % (08/02 1230) Weight:  [72.122 kg (159 lb)] 72.122 kg (159 lb) (08/02 1006)  Intake/Output from previous day:   Intake/Output this shift:    General appearance: alert, cooperative and no distress Resp: clear to auscultation bilaterally Cardio: regular rate and rhythm GI: soft, non-tender; bowel sounds normal; no masses,  no organomegaly Male genitalia: He has a normal phallus with an adequate meatus.  There is a 5cm erythematous left lower scrotal lesion without crepitous and only minimal induration.  The tissue is thickened and well demarcated consistent with the rubber band as the mechanism of injury.  There is no extension to the surrounding structures and the testes and epididymes are normal. Skin: Skin color, texture, turgor normal. No rashes or lesions Neurologic: Alert and oriented X 3, normal strength and tone. Normal symmetric reflexes. Normal coordination and gait Lymph nodes: Normal femoral and inguinal nodes.  Lab Results:   Basename 10/13/11 1013  WBC 5.2  HGB 11.2*  HCT 36.0*  PLT 259   BMET  Basename 10/13/11 1013  NA 138  K 3.7  CL 98  CO2 32  GLUCOSE 132*  BUN 9  CREATININE 0.83  CALCIUM 9.5   PT/INR  Basename 10/13/11 1013  LABPROT 20.9*  INR 1.77*   ABG No results found for this basename:  PHART:2,PCO2:2,PO2:2,HCO3:2 in the last 72 hours  Studies/Results: US Scrotum  10/13/2011  *RADIOLOGY REPORT*  Clinical Data:  Scrotal injury.  SCROTAL ULTRASOUND DOPPLER ULTRASOUND OF THE TESTICLES  Technique: Complete ultrasound examination of the testicles, epididymis, and other scrotal structures was performed.  Color and spectral Doppler ultrasound were also utilized to evaluate blood flow to the testicles.  Comparison:  None  Findings:  Right testis:  Measures 4.7 x 2.9 x 3.8 cm.  There is arterial and venous flow within the right testicle.  Right testicle has a normal appearance without a testicular mass or lesion.  Left testis:  Left testicle measures 2.9 x 4.1 x 3.2 cm.  There is a left hydrocele.  No evidence for testicular mass or lesion. There is arterial and venous flow within the left testicle.  Right epididymis:  Normal appearance of right epididymis with a small cyst that measures 0.5 cm.  Left epididymis:  No gross abnormality.  Hydrocele:  There are bilateral small hydroceles.  Varicocele:  Absent  Pulsed Doppler interrogation of both testes demonstrates low resistance flow bilaterally.  There appears to be diffuse skin thickening within the scrotum. There are hyperechoic areas which could represent gas within the scrotal soft tissues.  IMPRESSION: Normal appearance of both testicles and normal blood flow.  No evidence for testicular torsion.  Bilateral hydroceles.  Diffuse soft tissue thickening in the scrotum.  Cannot exclude gas within the soft tissues.  Original Report Authenticated By: Richarda Overlie, M.D.   [place] Art/ven Flow Abd Pelv Doppler  10/13/2011  *RADIOLOGY REPORT*  Clinical Data:  Scrotal injury.  SCROTAL ULTRASOUND DOPPLER ULTRASOUND OF THE TESTICLES  Technique: Complete ultrasound examination of the testicles, epididymis, and other scrotal structures was performed.  Color and spectral Doppler ultrasound were also utilized to evaluate blood flow to the testicles.  Comparison:  None   Findings:  Right testis:  Measures 4.7 x 2.9 x 3.8 cm.  There is arterial and venous flow within the right testicle.  Right testicle has a normal appearance without a testicular mass or lesion.  Left testis:  Left testicle measures 2.9 x 4.1 x 3.2 cm.  There is a left hydrocele.  No evidence for testicular mass or lesion. There is arterial and venous flow within the left testicle.  Right epididymis:  Normal appearance of right epididymis with a small cyst that measures 0.5 cm.  Left epididymis:  No gross abnormality.  Hydrocele:  There are bilateral small hydroceles.  Varicocele:  Absent  Pulsed Doppler interrogation of both testes demonstrates low resistance flow bilaterally.  There appears to be diffuse skin thickening within the scrotum. There are hyperechoic areas which could represent gas within the scrotal soft tissues.  IMPRESSION: Normal appearance of both testicles and normal blood flow.  No evidence for testicular torsion.  Bilateral hydroceles.  Diffuse soft tissue thickening in the scrotum.  Cannot exclude gas within the soft tissues.  Original Report Authenticated By: Richarda Overlie, M.D.    Anti-infectives: Anti-infectives     Start     Dose/Rate Route Frequency Ordered Stop   10/13/11 1345   vancomycin (VANCOCIN) IVPB 1000 mg/200 mL premix        1,000 mg 200 mL/hr over 60 Minutes Intravenous  Once 10/13/11 1340            Current Facility-Administered Medications  Medication Dose Route Frequency Provider Last Rate Last Dose  . vancomycin (VANCOCIN) IVPB 1000 mg/200 mL premix  1,000 mg Intravenous Once Joya Gaskins, MD   1,000 mg at 10/13/11 1359   Current Outpatient Prescriptions  Medication Sig Dispense Refill  . albuterol (PROAIR HFA) 108 (90 BASE) MCG/ACT inhaler Inhale 2 puffs into the lungs every 4 (four) hours as needed for wheezing.  8.5 g  4  . albuterol (PROVENTIL) (2.5 MG/3ML) 0.083% nebulizer solution INHALE 1 VIAL VIA NEBULIZER EVERY 6 HOURS AS NEEDED  375 mL  2  .  brimonidine-timolol (COMBIGAN) 0.2-0.5 % ophthalmic solution Place 1 drop into both eyes every 12 (twelve) hours.        . budesonide-formoterol (SYMBICORT) 160-4.5 MCG/ACT inhaler Inhale 2 puffs into the lungs 2 (two) times daily.  1 Inhaler  3  . CARTIA XT 300 MG 24 hr capsule TAKE 1 CAPSULE BY MOUTH EVERY DAY  30 capsule  2  . carvedilol (COREG) 3.125 MG tablet Take 1 tablet (3.125 mg total) by mouth 2 (two) times daily.  60 tablet  3  . furosemide (LASIX) 40 MG tablet Take 40 mg by mouth every  morning.      . potassium chloride (KLOR-CON 10) 10 MEQ tablet Take 1 tablet (10 mEq total) by mouth daily after breakfast.  30 tablet  5  . tiotropium (SPIRIVA HANDIHALER) 18 MCG inhalation capsule Place 1 capsule (18 mcg total) into inhaler and inhale daily.  30 capsule  6  . travoprost, benzalkonium, (TRAVATAN Z) 0.004 % ophthalmic solution Place 1 drop into both eyes at bedtime.       Marland Kitchen warfarin (COUMADIN) 4 MG tablet Take 4-8 mg by mouth daily. Take 2 tablets (8 mg) on Tuesdays and Fridays, then take 1 tablet (4 mg) on Sundays, Mondays,Wednesdays,Thursdays,Saturdays      . DISCONTD: furosemide (LASIX) 40 MG tablet Take 1 tablet (40 mg total) by mouth 2 (two) times daily.  60 tablet  1  . DISCONTD: warfarin (COUMADIN) 4 MG tablet Take 8 mg on Tuesday and Friday, 4 mg on Sun, Mon,Wed,Thurs,Sat  45 tablet  3    Assessment: He has an area of dead but not obviously gangrenous left scrotal wall but it doesn't appear to be consistent with fornier's gangrene. He remains on his coumadin.  Plan: He should use Silvadene cream on the scrotum pending further therapy. I would like to have him taken off of the Coumadin and set up for debridement of the affected area next week in [place] but he reports that he has no transportation and I will not be back up here next week. In light of that situation, he could just use the Silvadene cream and could f/u with me in 3-4 weeks when I am back up here. If he  progresses, he would either have to come to [facility] or be managed locally by one of the general surgeons.   LOS: 0 days    Dorreen Valiente J 10/13/2011

## 2011-10-13 NOTE — ED Provider Notes (Signed)
History   This chart was scribed for Joya Gaskins, MD by Charolett Bumpers . The patient was seen in room APA01/APA01. Patient's care was started at 0956.    CSN: [id number]  Arrival date & time 10/13/11  7829   First MD Initiated Contact with Patient 10/13/11 [phone]      Chief Complaint  Patient presents with  . Testicle Injury     HPI Theodore [name] is a 71 y.o. male who presents to the Emergency Department complaining of constant, moderate testicle injury for the past 4 days. Pt reports associated testicle pain and swelling. Pt states that he bleeds easily and has a h/o anemia. Pt states that he scratched his left testicle on Monday and was unable to stop the bleeding with pressure. Pt reports that he then applied a rubber band to testicle and did not take it off until Tuesday morning. Pt states that we he took the rubber band off, the skin tore and reports associated swelling. Pt reports that he applied antibiotic cream with no relief. Pt denies any difficulty urinating. Pt does reports some increased SOB and has a h/o COPD. Pt states that he is on Warfarin. Pt denies being on O2 at home.   Past Medical History  Diagnosis Date  . Anemia     Hemoglobin 11.6 in 04/2008->resolved   . Edema     Lower Extremity  . Tobacco abuse   . Glaucoma   . Allergic rhinitis   . Atrial fibrillation     Coumadin;asymptomatic bradycardia on telemetry in 12/2009  . CHF (congestive heart failure)     H/o CHF with preserved LV EF; pulmonary hypertension; normal EF in 03/2009  . Hypertension   . Shingles   . Shortness of breath   . COPD (chronic obstructive pulmonary disease)     Past Surgical History  Procedure Date  . Bilateral cataract surgery   . Herniorrhapy   . Tendon repair     right hand surgical procedure for a tendon repair    Family History  Problem Relation Age of Onset  . Cancer Mother   . Cancer Father   . Coronary artery disease Brother     History  Substance  Use Topics  . Smoking status: Current Everyday Smoker -- 0.5 packs/day for .5 years    Types: Cigarettes  . Smokeless tobacco: Never Used  . Alcohol Use: No      Review of Systems  Constitutional: Negative for fever and chills.  Respiratory: Positive for shortness of breath. Negative for cough.   Gastrointestinal: Negative for abdominal pain.  Genitourinary: Positive for scrotal swelling and testicular pain. Negative for difficulty urinating.  Musculoskeletal: Negative for back pain.  Hematological: Bruises/bleeds easily.  All other systems reviewed and are negative.    Allergies  Review of patient's allergies indicates no known allergies.  Home Medications   Current Outpatient Rx  Name Route Sig Dispense Refill  . ALBUTEROL SULFATE HFA 108 (90 BASE) MCG/ACT IN AERS Inhalation Inhale 2 puffs into the lungs every 4 (four) hours as needed for wheezing. 8.5 g 4  . ALBUTEROL SULFATE (2.5 MG/3ML) 0.083% IN NEBU  INHALE 1 VIAL VIA NEBULIZER EVERY 6 HOURS AS NEEDED 375 mL 2  . BRIMONIDINE TARTRATE-TIMOLOL 0.2-0.5 % OP SOLN Both Eyes Place 1 drop into both eyes every 12 (twelve) hours.      . BUDESONIDE-FORMOTEROL FUMARATE 160-4.5 MCG/ACT IN AERO Inhalation Inhale 2 puffs into the lungs 2 (two) times daily. 1  Inhaler 3  . CARTIA XT 300 MG PO CP24  TAKE 1 CAPSULE BY MOUTH EVERY DAY 30 capsule 2    Please send refills to pt PCP Dr. Syliva Overman  . CARVEDILOL 3.125 MG PO TABS Oral Take 1 tablet (3.125 mg total) by mouth 2 (two) times daily. 60 tablet 3  . FUROSEMIDE 40 MG PO TABS Oral Take 1 tablet (40 mg total) by mouth 2 (two) times daily. 60 tablet 1  . POTASSIUM CHLORIDE ER 10 MEQ PO TBCR Oral Take 1 tablet (10 mEq total) by mouth daily after breakfast. 30 tablet 5    Discontinue potassium cl effective 02/28/201 ...  . TIOTROPIUM BROMIDE MONOHYDRATE 18 MCG IN CAPS Inhalation Place 1 capsule (18 mcg total) into inhaler and inhale daily. 30 capsule 6  . TRAVOPROST 0.004 % OP  SOLN Both Eyes Place 1 drop into both eyes at bedtime.     . WARFARIN SODIUM 4 MG PO TABS  Take 8 mg on Tuesday and Friday, 4 mg on Sun, Mon,Wed,Thurs,Sat 45 tablet 3   BP 184/103  Pulse 70  Temp 98.1 F (36.7 C) (Oral)  Resp 20  Ht 5\' 6"  (1.676 m)  Wt 159 lb (72.122 kg)  BMI 25.66 kg/m2  SpO2 95%  Physical Exam CONSTITUTIONAL: Well developed/well nourished HEAD AND FACE: Normocephalic/atraumatic EYES: EOMI/PERRL ENMT: Mucous membranes moist NECK: supple no meningeal signs CV: S1/S2 noted, no murmurs/rubs/gallops noted LUNGS: No apparent distress, coarse breath sounds.  ABDOMEN: soft, nontender, no rebound or guarding WU:[id number].  Left scrotum has excoriation/erythema but no significant tenderness or crepitance. It is not actively bleeding. The testicle appears to be separate from the wound.  No hernia noted.  Chaperone present NEURO: Pt is awake/alert, moves all extremitiesx4 EXTREMITIES: pulses normal, full ROM SKIN: warm, color normal PSYCH: no abnormalities of mood noted  ED Course  Procedures   DIAGNOSTIC STUDIES: Oxygen Saturation is 95% on room air, adequate by my interpretation.    COORDINATION OF CARE:  10:11-Discussed planned course of treatment with the patient including blood work, who is agreeable at this time. Will consult Urology.   11:08 AM Will obtain US of scrotum  13:45-Medication Orders: Vancomycin (Vancocin) IVPB 1000 mg/200 mL premix-once  2:07 PM D/w dr Annabell Howells, urology, to see patient in the ED  Seen by dr Annabell Howells, he recommends oral keflex, place silvadene cream bid.  He has no transportation to [place], so we agreed to have him return to ER in two days for recheck.    Results for orders placed during the hospital encounter of 10/13/11  CBC WITH DIFFERENTIAL      Component Value Range   WBC 5.2  4.0 - 10.5 K/uL   RBC 3.98 (*) 4.22 - 5.81 MIL/uL   Hemoglobin 11.2 (*) 13.0 - 17.0 g/dL   HCT 21.3 (*) 08.6 - 57.8 %   MCV 90.5  78.0 - 100.0 fL     MCH 28.1  26.0 - 34.0 pg   MCHC 31.1  30.0 - 36.0 g/dL   RDW 46.9  62.9 - 52.8 %   Platelets 259  150 - 400 K/uL   Neutrophils Relative 64  43 - 77 %   Neutro Abs 3.3  1.7 - 7.7 K/uL   Lymphocytes Relative 18  12 - 46 %   Lymphs Abs 0.9  0.7 - 4.0 K/uL   Monocytes Relative 14 (*) 3 - 12 %   Monocytes Absolute 0.7  0.1 - 1.0 K/uL   Eosinophils  Relative 3  0 - 5 %   Eosinophils Absolute 0.2  0.0 - 0.7 K/uL   Basophils Relative 1  0 - 1 %   Basophils Absolute 0.0  0.0 - 0.1 K/uL  BASIC METABOLIC PANEL      Component Value Range   Sodium 138  135 - 145 mEq/L   Potassium 3.7  3.5 - 5.1 mEq/L   Chloride 98  96 - 112 mEq/L   CO2 32  19 - 32 mEq/L   Glucose, Bld 132 (*) 70 - 99 mg/dL   BUN 9  6 - 23 mg/dL   Creatinine, Ser 1.61  0.50 - 1.35 mg/dL   Calcium 9.5  8.4 - 09.6 mg/dL   GFR calc non Af Amer 86 (*) >90 mL/min   GFR calc Af Amer >90  >90 mL/min  PROTIME-INR      Component Value Range   Prothrombin Time 20.9 (*) 11.6 - 15.2 seconds   INR 1.77 (*) 0.00 - 1.49   US Scrotum  10/13/2011  *RADIOLOGY REPORT*  Clinical Data:  Scrotal injury.  SCROTAL ULTRASOUND DOPPLER ULTRASOUND OF THE TESTICLES  Technique: Complete ultrasound examination of the testicles, epididymis, and other scrotal structures was performed.  Color and spectral Doppler ultrasound were also utilized to evaluate blood flow to the testicles.  Comparison:  None  Findings:  Right testis:  Measures 4.7 x 2.9 x 3.8 cm.  There is arterial and venous flow within the right testicle.  Right testicle has a normal appearance without a testicular mass or lesion.  Left testis:  Left testicle measures 2.9 x 4.1 x 3.2 cm.  There is a left hydrocele.  No evidence for testicular mass or lesion. There is arterial and venous flow within the left testicle.  Right epididymis:  Normal appearance of right epididymis with a small cyst that measures 0.5 cm.  Left epididymis:  No gross abnormality.  Hydrocele:  There are bilateral small  hydroceles.  Varicocele:  Absent  Pulsed Doppler interrogation of both testes demonstrates low resistance flow bilaterally.  There appears to be diffuse skin thickening within the scrotum. There are hyperechoic areas which could represent gas within the scrotal soft tissues.  IMPRESSION: Normal appearance of both testicles and normal blood flow.  No evidence for testicular torsion.  Bilateral hydroceles.  Diffuse soft tissue thickening in the scrotum.  Cannot exclude gas within the soft tissues.  Original Report Authenticated By: Richarda Overlie, M.D.   [place] Art/ven Flow Abd Pelv Doppler  10/13/2011  *RADIOLOGY REPORT*  Clinical Data:  Scrotal injury.  SCROTAL ULTRASOUND DOPPLER ULTRASOUND OF THE TESTICLES  Technique: Complete ultrasound examination of the testicles, epididymis, and other scrotal structures was performed.  Color and spectral Doppler ultrasound were also utilized to evaluate blood flow to the testicles.  Comparison:  None  Findings:  Right testis:  Measures 4.7 x 2.9 x 3.8 cm.  There is arterial and venous flow within the right testicle.  Right testicle has a normal appearance without a testicular mass or lesion.  Left testis:  Left testicle measures 2.9 x 4.1 x 3.2 cm.  There is a left hydrocele.  No evidence for testicular mass or lesion. There is arterial and venous flow within the left testicle.  Right epididymis:  Normal appearance of right epididymis with a small cyst that measures 0.5 cm.  Left epididymis:  No gross abnormality.  Hydrocele:  There are bilateral small hydroceles.  Varicocele:  Absent  Pulsed Doppler interrogation of both testes  demonstrates low resistance flow bilaterally.  There appears to be diffuse skin thickening within the scrotum. There are hyperechoic areas which could represent gas within the scrotal soft tissues.  IMPRESSION: Normal appearance of both testicles and normal blood flow.  No evidence for testicular torsion.  Bilateral hydroceles.  Diffuse soft tissue thickening  in the scrotum.  Cannot exclude gas within the soft tissues.  Original Report Authenticated By: Richarda Overlie, M.D.       MDM  Nursing notes including past medical history and social history reviewed and considered in documentation    I personally performed the services described in this documentation, which was scribed in my presence. The recorded information has been reviewed and considered.         Joya Gaskins, MD 10/13/11 1504

## 2011-10-13 NOTE — ED Notes (Signed)
Pt states he was bleeding to his scrotum and applied a rubber band to testicle. ?Abrasion/ rawness to left testicle.

## 2011-10-15 ENCOUNTER — Emergency Department (HOSPITAL_COMMUNITY)
Admission: EM | Admit: 2011-10-15 | Discharge: 2011-10-15 | Disposition: A | Discharge status: Home / Self Care | Payer: Medicare Other | Attending: Emergency Medicine | Admitting: Emergency Medicine

## 2011-10-15 ENCOUNTER — Encounter (HOSPITAL_COMMUNITY): Payer: Self-pay | Admitting: *Deleted

## 2011-10-15 DIAGNOSIS — I10 Essential (primary) hypertension: Secondary | ICD-10-CM | POA: Insufficient documentation

## 2011-10-15 DIAGNOSIS — J449 Chronic obstructive pulmonary disease, unspecified: Secondary | ICD-10-CM | POA: Insufficient documentation

## 2011-10-15 DIAGNOSIS — I4891 Unspecified atrial fibrillation: Secondary | ICD-10-CM | POA: Insufficient documentation

## 2011-10-15 DIAGNOSIS — I509 Heart failure, unspecified: Secondary | ICD-10-CM | POA: Insufficient documentation

## 2011-10-15 DIAGNOSIS — S39848A Other specified injuries of external genitals, initial encounter: Secondary | ICD-10-CM | POA: Insufficient documentation

## 2011-10-15 DIAGNOSIS — D649 Anemia, unspecified: Secondary | ICD-10-CM | POA: Insufficient documentation

## 2011-10-15 DIAGNOSIS — J4489 Other specified chronic obstructive pulmonary disease: Secondary | ICD-10-CM | POA: Insufficient documentation

## 2011-10-15 DIAGNOSIS — S30823A Blister (nonthermal) of scrotum and testes, initial encounter: Secondary | ICD-10-CM

## 2011-10-15 DIAGNOSIS — X58XXXA Exposure to other specified factors, initial encounter: Secondary | ICD-10-CM | POA: Insufficient documentation

## 2011-10-15 DIAGNOSIS — S3994XA Unspecified injury of external genitals, initial encounter: Secondary | ICD-10-CM | POA: Insufficient documentation

## 2011-10-15 DIAGNOSIS — Z7901 Long term (current) use of anticoagulants: Secondary | ICD-10-CM | POA: Insufficient documentation

## 2011-10-15 DIAGNOSIS — F17200 Nicotine dependence, unspecified, uncomplicated: Secondary | ICD-10-CM | POA: Insufficient documentation

## 2011-10-15 NOTE — ED Provider Notes (Signed)
History  This chart was scribed for Theodore Gaskins, MD by Erskine Emery. This patient was seen in room APA19/APA19 and the patient's care was started at 7:56.   CSN: [id number]  Arrival date & time 10/15/11  0750   First MD Initiated Contact with Patient 10/15/11 [id number]      Chief Complaint - wound check, scrotal wound   HPI  Theodore [name] is a 71 y.o. male who presents to the Emergency Department complaining of a persistent but gradually improving scrotal injury. Pt denies any associated nausea, vomiting, or dysuria. Pt was seen here 2 days ago and is here now for follow up.  Pt's PCP is Dr. Lodema Hong.    Past Medical History  Diagnosis Date  . Anemia     Hemoglobin 11.6 in 04/2008->resolved   . Edema     Lower Extremity  . Tobacco abuse   . Glaucoma   . Allergic rhinitis   . Atrial fibrillation     Coumadin;asymptomatic bradycardia on telemetry in 12/2009  . CHF (congestive heart failure)     H/o CHF with preserved LV EF; pulmonary hypertension; normal EF in 03/2009  . Hypertension   . Shingles   . Shortness of breath   . COPD (chronic obstructive pulmonary disease)     Past Surgical History  Procedure Date  . Bilateral cataract surgery   . Herniorrhapy   . Tendon repair     right hand surgical procedure for a tendon repair    Family History  Problem Relation Age of Onset  . Cancer Mother   . Cancer Father   . Coronary artery disease Brother     History  Substance Use Topics  . Smoking status: Current Everyday Smoker -- 0.5 packs/day for .5 years    Types: Cigarettes  . Smokeless tobacco: Never Used  . Alcohol Use: No      Review of Systems  Constitutional: Negative for fever and chills.  Respiratory: Negative for shortness of breath.   Gastrointestinal: Negative for nausea, vomiting and diarrhea.  Genitourinary: Positive for testicular pain. Negative for dysuria.  Neurological: Negative for weakness.    Allergies  Review of patient's allergies  indicates no known allergies.  Home Medications   Current Outpatient Rx  Name Route Sig Dispense Refill  . ALBUTEROL SULFATE HFA 108 (90 BASE) MCG/ACT IN AERS Inhalation Inhale 2 puffs into the lungs every 4 (four) hours as needed for wheezing. 8.5 g 4  . ALBUTEROL SULFATE (2.5 MG/3ML) 0.083% IN NEBU  INHALE 1 VIAL VIA NEBULIZER EVERY 6 HOURS AS NEEDED 375 mL 2  . BRIMONIDINE TARTRATE-TIMOLOL 0.2-0.5 % OP SOLN Both Eyes Place 1 drop into both eyes every 12 (twelve) hours.      . BUDESONIDE-FORMOTEROL FUMARATE 160-4.5 MCG/ACT IN AERO Inhalation Inhale 2 puffs into the lungs 2 (two) times daily. 1 Inhaler 3  . CARTIA XT 300 MG PO CP24  TAKE 1 CAPSULE BY MOUTH EVERY DAY 30 capsule 2    Please send refills to pt PCP Dr. Syliva Overman  . CARVEDILOL 3.125 MG PO TABS Oral Take 1 tablet (3.125 mg total) by mouth 2 (two) times daily. 60 tablet 3  . CEPHALEXIN 500 MG PO CAPS Oral Take 1 capsule (500 mg total) by mouth 2 (two) times daily. 20 capsule 0  . FUROSEMIDE 40 MG PO TABS Oral Take 40 mg by mouth every morning.    Marland Kitchen POTASSIUM CHLORIDE ER 10 MEQ PO TBCR Oral Take 1 tablet (10 mEq  total) by mouth daily after breakfast. 30 tablet 5    Discontinue potassium cl effective 02/28/201 ...  . SILVER SULFADIAZINE 1 % EX CREA Topical Apply topically 2 (two) times daily. 50 g 2  . TIOTROPIUM BROMIDE MONOHYDRATE 18 MCG IN CAPS Inhalation Place 1 capsule (18 mcg total) into inhaler and inhale daily. 30 capsule 6  . TRAVOPROST 0.004 % OP SOLN Both Eyes Place 1 drop into both eyes at bedtime.     . WARFARIN SODIUM 4 MG PO TABS Oral Take 4-8 mg by mouth daily. Take 2 tablets (8 mg) on Tuesdays and Fridays, then take 1 tablet (4 mg) on Sundays, Mondays,Wednesdays,Thursdays,Saturdays      BP 167/80  Pulse 78  Temp 98.3 F (36.8 C) (Oral)  Resp 16  Ht 5\' 6"  (1.676 m)  Wt 159 lb (72.122 kg)  BMI 25.66 kg/m2  SpO2 93%  Physical Exam  CONSTITUTIONAL: Well developed/well nourished HEAD AND FACE:  Normocephalic/atraumatic EYES: EOMI ENMT: Mucous membranes moist NECK: supple no meningeal signs ABDOMEN: soft, nontender, no rebound or guarding GU:no cva tenderness.  Scrotal wound noted, improved from prior exam, no crepitance/bleeding or erythema noted.  It is nontender to palpation NEURO: Pt is awake/alert, moves all extremitiesx4 EXTREMITIES: pulses normal, full ROM SKIN: warm, color normal PSYCH: no abnormalities of mood noted   ED Course  Procedures  DIAGNOSTIC STUDIES:    COORDINATION OF CARE: 8:00--I evaluated the patient and we discussed a treatment plan including continuing with his medications to which the pt agreed. I instructed the pt to see Dr. Lodema Hong or a urologist within the next week or two. I informed him that he doesn't need to come back to the ED unless his symptoms worsen or he develops a fever.  His wound is healing well, no crepitance and appears improved   Labs Reviewed - No data to display US Scrotum  10/13/2011  *RADIOLOGY REPORT*  Clinical Data:  Scrotal injury.  SCROTAL ULTRASOUND DOPPLER ULTRASOUND OF THE TESTICLES  Technique: Complete ultrasound examination of the testicles, epididymis, and other scrotal structures was performed.  Color and spectral Doppler ultrasound were also utilized to evaluate blood flow to the testicles.  Comparison:  None  Findings:  Right testis:  Measures 4.7 x 2.9 x 3.8 cm.  There is arterial and venous flow within the right testicle.  Right testicle has a normal appearance without a testicular mass or lesion.  Left testis:  Left testicle measures 2.9 x 4.1 x 3.2 cm.  There is a left hydrocele.  No evidence for testicular mass or lesion. There is arterial and venous flow within the left testicle.  Right epididymis:  Normal appearance of right epididymis with a small cyst that measures 0.5 cm.  Left epididymis:  No gross abnormality.  Hydrocele:  There are bilateral small hydroceles.  Varicocele:  Absent  Pulsed Doppler interrogation of  both testes demonstrates low resistance flow bilaterally.  There appears to be diffuse skin thickening within the scrotum. There are hyperechoic areas which could represent gas within the scrotal soft tissues.  IMPRESSION: Normal appearance of both testicles and normal blood flow.  No evidence for testicular torsion.  Bilateral hydroceles.  Diffuse soft tissue thickening in the scrotum.  Cannot exclude gas within the soft tissues.  Original Report Authenticated By: Richarda Overlie, M.D.   [place] Art/ven Flow Abd Pelv Doppler  10/13/2011  *RADIOLOGY REPORT*  Clinical Data:  Scrotal injury.  SCROTAL ULTRASOUND DOPPLER ULTRASOUND OF THE TESTICLES  Technique: Complete ultrasound  examination of the testicles, epididymis, and other scrotal structures was performed.  Color and spectral Doppler ultrasound were also utilized to evaluate blood flow to the testicles.  Comparison:  None  Findings:  Right testis:  Measures 4.7 x 2.9 x 3.8 cm.  There is arterial and venous flow within the right testicle.  Right testicle has a normal appearance without a testicular mass or lesion.  Left testis:  Left testicle measures 2.9 x 4.1 x 3.2 cm.  There is a left hydrocele.  No evidence for testicular mass or lesion. There is arterial and venous flow within the left testicle.  Right epididymis:  Normal appearance of right epididymis with a small cyst that measures 0.5 cm.  Left epididymis:  No gross abnormality.  Hydrocele:  There are bilateral small hydroceles.  Varicocele:  Absent  Pulsed Doppler interrogation of both testes demonstrates low resistance flow bilaterally.  There appears to be diffuse skin thickening within the scrotum. There are hyperechoic areas which could represent gas within the scrotal soft tissues.  IMPRESSION: Normal appearance of both testicles and normal blood flow.  No evidence for testicular torsion.  Bilateral hydroceles.  Diffuse soft tissue thickening in the scrotum.  Cannot exclude gas within the soft tissues.   Original Report Authenticated By: Richarda Overlie, M.D.       MDM   Nursing notes including past medical history and social history reviewed and considered in documentation Previous records reviewed and considered - recent visit for scrotal wound      I personally performed the services described in this documentation, which was scribed in my presence. The recorded information has been reviewed and considered.            Theodore Gaskins, MD 10/15/11 1028

## 2011-10-15 NOTE — ED Notes (Signed)
Pt here for wound check of his testicle.

## 2011-10-15 NOTE — ED Notes (Signed)
Pt seen by MD prior to nursing assessment and is being discharged.

## 2011-10-16 NOTE — Addendum Note (Signed)
Addended by: Kandis Fantasia B on: 10/16/2011 10:07 AM   Modules accepted: Orders

## 2011-10-16 NOTE — Telephone Encounter (Signed)
Order faxed and request made for thn to help patient with making and keeping appt.

## 2011-10-18 ENCOUNTER — Ambulatory Visit (INDEPENDENT_AMBULATORY_CARE_PROVIDER_SITE_OTHER): Payer: Medicare Other | Admitting: Family Medicine

## 2011-10-18 ENCOUNTER — Encounter: Payer: Self-pay | Admitting: Family Medicine

## 2011-10-18 VITALS — BP 142/86 | HR 74 | Resp 20 | Ht 67.5 in | Wt 162.0 lb

## 2011-10-18 DIAGNOSIS — R972 Elevated prostate specific antigen [PSA]: Secondary | ICD-10-CM

## 2011-10-18 DIAGNOSIS — I10 Essential (primary) hypertension: Secondary | ICD-10-CM

## 2011-10-18 DIAGNOSIS — J449 Chronic obstructive pulmonary disease, unspecified: Secondary | ICD-10-CM

## 2011-10-18 NOTE — Patient Instructions (Addendum)
F/u in 3 month  YOU NEED to see the urologist about your prostate,you were referred since Februaury, THIS iS IMPORTANT   YOU NEED to use the symbicort and spiriva inhalers every day to improve your breathing , You need oxygen and will be referred for this.

## 2011-10-22 DIAGNOSIS — J449 Chronic obstructive pulmonary disease, unspecified: Secondary | ICD-10-CM | POA: Insufficient documentation

## 2011-10-22 NOTE — Assessment & Plan Note (Signed)
Non compliant with daily inhalers for symptom control, and overuses rescue med. Re educated again, as has the [facility] staff done, regarding the importance of daily  Medication as prescribed and less reliance on rescue medication. Pt states he will change his behavior in this regard

## 2011-10-22 NOTE — Assessment & Plan Note (Signed)
Controlled, no change in medication DASH diet and commitment to daily physical activity for a minimum of 30 minutes discussed and encouraged, as a part of hypertension management. The importance of attaining a healthy weight is also discussed.  

## 2011-10-22 NOTE — Progress Notes (Signed)
  Subjective:    Patient ID: Theodore [name], male    DOB: [birth year], 71 y.o.   MRN: [id number]  HPI Pt in for  follow up of chronic problems. He was recently in the Ed and is currently being treated for laceration on penis, which he tried to stop with a rubber band and subsequently developed excoriated skin. This appears top be healing well, no erythema or drainage. Pt has been overusing and relying on his rescue inhalers for COPD management despite multiple efforts to have him change the behavior. He is hypoxic at [facility] visit and will benefit from supplemental oxygen, he is referred for this. He has still not been to urology re abn PSA and this needs follow up, now that [facility] is involved, hopefully this will be addressed   Review of Systems See HPI Denies recent fever or chills. Denies sinus pressure, nasal congestion, ear pain or sore throat. Denies chest congestion or  productive cough .Chronic shortness of breath with chest tightness Denies chest pains, palpitations and leg swelling Denies abdominal pain, nausea, vomiting,diarrhea or constipation.   Denies dysuria, frequency, hesitancy or incontinence. Denies joint pain, swelling and limitation in mobility. Denies headaches, seizures, numbness, or tingling. Denies depression, anxiety or insomnia. sh.        Objective:   Physical Exam Patient alert and oriented and in no cardiopulmonary distress.  HEENT: No facial asymmetry, EOMI, no sinus tenderness,  oropharynx pink and moist.  Neck supple no adenopathy.  Chest: decreased air entry throughout, no wheezes or crackles heard.  CVS: S1, S2 no murmurs, no S3.  ABD: Soft non tender. Bowel sounds normal.  Ext: No edema  MS: Adequate ROM spine, shoulders, hips and knees.  Skin:healing lesion on left testicle, no erythema or drainage.  Psych: Good eye contact, normal affect. Memory intact not anxious or depressed appearing.  CNS: CN 2-12 intact, power, tone and sensation  normal throughout.        Assessment & Plan:

## 2011-10-22 NOTE — Assessment & Plan Note (Signed)
Abnormal lab x 6 month, still has not been seen by urology, message to [facility] to ensure this happens

## 2011-10-23 ENCOUNTER — Telehealth: Payer: Self-pay

## 2011-10-23 NOTE — Telephone Encounter (Signed)
pls advise he needs to take the coumadin ass  Prescribed and keep appt at the coumadin clinic, i will not follow his coumadin

## 2011-10-24 ENCOUNTER — Telehealth: Payer: Self-pay | Admitting: Family Medicine

## 2011-10-24 NOTE — Telephone Encounter (Signed)
Called and spoke with Theodore [name] with [facility] and she will call and advise patient to take Coumadin 4 mg as prescribed and she will followup with coumadin clinic

## 2011-10-27 NOTE — Telephone Encounter (Signed)
Not yet, but will call for some samples

## 2011-11-08 NOTE — Telephone Encounter (Signed)
Have been trying to contact pt to let him know we have samples, will contact his casemanager to make her aware

## 2011-11-15 ENCOUNTER — Other Ambulatory Visit: Payer: Self-pay | Admitting: Family Medicine

## 2011-11-28 ENCOUNTER — Telehealth: Payer: Self-pay | Admitting: *Deleted

## 2011-11-28 NOTE — Telephone Encounter (Signed)
[name] is RN Sports coach for pt now.  Social work is involved as well.  They have helped pt and wife apply for medicaid and should be approved soon.  Alisa can draw INRs and call to me until pt approved.  Last INR 1.35  Pt was restarted on coumadin 4mg  daily by Dr Lodema Hong.  Order given for Alisa to draw repeat INR on 9/19 or 9/20 with results called to me.

## 2011-12-15 ENCOUNTER — Other Ambulatory Visit: Payer: Self-pay | Admitting: Family Medicine

## 2011-12-15 ENCOUNTER — Other Ambulatory Visit: Payer: Self-pay | Admitting: Cardiology

## 2011-12-18 ENCOUNTER — Ambulatory Visit (INDEPENDENT_AMBULATORY_CARE_PROVIDER_SITE_OTHER): Payer: Medicare Other | Admitting: *Deleted

## 2011-12-18 DIAGNOSIS — Z7901 Long term (current) use of anticoagulants: Secondary | ICD-10-CM

## 2011-12-18 DIAGNOSIS — I4891 Unspecified atrial fibrillation: Secondary | ICD-10-CM

## 2011-12-18 MED ORDER — WARFARIN SODIUM 4 MG PO TABS: ORAL_TABLET | ORAL | Status: DC

## 2011-12-28 ENCOUNTER — Encounter (HOSPITAL_COMMUNITY): Payer: Self-pay

## 2011-12-28 ENCOUNTER — Emergency Department (HOSPITAL_COMMUNITY): Payer: Medicare Other

## 2011-12-28 ENCOUNTER — Emergency Department (HOSPITAL_COMMUNITY)
Admission: EM | Admit: 2011-12-28 | Discharge: 2011-12-28 | Disposition: A | Discharge status: Home / Self Care | Payer: Medicare Other | Attending: Emergency Medicine | Admitting: Emergency Medicine

## 2011-12-28 ENCOUNTER — Telehealth: Payer: Self-pay | Admitting: Family Medicine

## 2011-12-28 DIAGNOSIS — Z79899 Other long term (current) drug therapy: Secondary | ICD-10-CM | POA: Insufficient documentation

## 2011-12-28 DIAGNOSIS — I509 Heart failure, unspecified: Secondary | ICD-10-CM | POA: Insufficient documentation

## 2011-12-28 DIAGNOSIS — J441 Chronic obstructive pulmonary disease with (acute) exacerbation: Secondary | ICD-10-CM | POA: Insufficient documentation

## 2011-12-28 DIAGNOSIS — I10 Essential (primary) hypertension: Secondary | ICD-10-CM | POA: Insufficient documentation

## 2011-12-28 DIAGNOSIS — D649 Anemia, unspecified: Secondary | ICD-10-CM | POA: Insufficient documentation

## 2011-12-28 DIAGNOSIS — Z7901 Long term (current) use of anticoagulants: Secondary | ICD-10-CM | POA: Insufficient documentation

## 2011-12-28 DIAGNOSIS — I4891 Unspecified atrial fibrillation: Secondary | ICD-10-CM | POA: Insufficient documentation

## 2011-12-28 DIAGNOSIS — Z87891 Personal history of nicotine dependence: Secondary | ICD-10-CM | POA: Insufficient documentation

## 2011-12-28 LAB — CBC WITH DIFFERENTIAL/PLATELET
Basophils Relative: 0 % (ref 0–1)
Eosinophils Absolute: 0.1 10*3/uL (ref 0.0–0.7)
HCT: 39.8 % (ref 39.0–52.0)
Hemoglobin: 12.4 g/dL — ABNORMAL LOW (ref 13.0–17.0)
Lymphs Abs: 1 10*3/uL (ref 0.7–4.0)
MCH: 29.1 pg (ref 26.0–34.0)
MCHC: 31.2 g/dL (ref 30.0–36.0)
Monocytes Absolute: 0.8 10*3/uL (ref 0.1–1.0)
Monocytes Relative: 14 % — ABNORMAL HIGH (ref 3–12)

## 2011-12-28 LAB — BASIC METABOLIC PANEL
BUN: 17 mg/dL (ref 6–23)
Chloride: 100 mEq/L (ref 96–112)
Creatinine, Ser: 0.95 mg/dL (ref 0.50–1.35)
GFR calc Af Amer: >90 mL/min (ref >90)
Glucose, Bld: 116 mg/dL — ABNORMAL HIGH (ref 70–99)

## 2011-12-28 MED ORDER — PREDNISONE 20 MG PO TABS
60.0000 mg | ORAL_TABLET | Freq: Once | ORAL | Status: AC
  Administered 2011-12-28: 60 mg via ORAL
  Filled 2011-12-28: qty 3

## 2011-12-28 MED ORDER — IPRATROPIUM BROMIDE 0.02 % IN SOLN
0.5000 mg | Freq: Once | RESPIRATORY_TRACT | Status: AC
  Administered 2011-12-28: 0.5 mg via RESPIRATORY_TRACT
  Filled 2011-12-28: qty 2.5

## 2011-12-28 MED ORDER — ALBUTEROL SULFATE (5 MG/ML) 0.5% IN NEBU
2.5000 mg | INHALATION_SOLUTION | Freq: Once | RESPIRATORY_TRACT | Status: AC
  Administered 2011-12-28: 2.5 mg via RESPIRATORY_TRACT
  Filled 2011-12-28: qty 0.5

## 2011-12-28 MED ORDER — PREDNISONE 10 MG PO TABS: 40.0000 mg | ORAL_TABLET | Freq: Every day | ORAL | Status: DC

## 2011-12-28 NOTE — ED Notes (Signed)
Sob and cough. Sob increasing per pt. Pt speaks short phrases in triage

## 2011-12-28 NOTE — ED Provider Notes (Addendum)
History  This chart was scribed for Theodore Jakes, MD by Theodore [name]. This patient was seen in room APA12/APA12 and the patient's care was started at 1655.   CSN: [id number]  Arrival date & time 12/28/11  1655   First MD Initiated Contact with Patient 12/28/11 1804      Chief Complaint  Patient presents with  . Shortness of Breath  . Cough   Patient is a 71 y.o. male presenting with shortness of breath and cough. The history is provided by the patient. No language interpreter was used.  Shortness of Breath  The current episode started 5 to 7 days ago. The problem occurs continuously. The problem has been gradually worsening. The problem is mild. Nothing relieves the symptoms. The symptoms are aggravated by activity. Associated symptoms include cough and shortness of breath. Pertinent negatives include no chest pain and no fever. He has received no recent medical care.  Cough Associated symptoms include shortness of breath. Pertinent negatives include no chest pain and no chills.   Theodore [name] is a 71 y.o. male who presents to the Emergency w/hx COPD Department complaining of increased SOB and productive cough over the past week. He states SOB is worse with exertion and minimal relief from his inhaler. He has CHF and minimal swelling in his legs, no CP. He is on coumadin for A-fib. He denies nausea, emesis, abdominal pain, fever.   His PCP is Dr. Lodema Hong  Past Medical History  Diagnosis Date  . Anemia     Hemoglobin 11.6 in 04/2008->resolved   . Edema     Lower Extremity  . Tobacco abuse   . Glaucoma(365)   . Allergic rhinitis   . Atrial fibrillation     Coumadin;asymptomatic bradycardia on telemetry in 12/2009  . CHF (congestive heart failure)     H/o CHF with preserved LV EF; pulmonary hypertension; normal EF in 03/2009  . Hypertension   . Shingles   . Shortness of breath   . COPD (chronic obstructive pulmonary disease)     Past Surgical History  Procedure Date    . Bilateral cataract surgery   . Herniorrhapy   . Tendon repair     right hand surgical procedure for a tendon repair    Family History  Problem Relation Age of Onset  . Cancer Mother   . Cancer Father   . Coronary artery disease Brother     History  Substance Use Topics  . Smoking status: Former Smoker -- 0.5 packs/[name] for .5 years    Types: Cigarettes    Quit date: 09/27/2011  . Smokeless tobacco: Never Used  . Alcohol Use: No      Review of Systems  Constitutional: Negative for fever and chills.  HENT: Positive for congestion.   Respiratory: Positive for cough and shortness of breath.   Cardiovascular: Positive for leg swelling (baseline). Negative for chest pain.  Gastrointestinal: Negative for nausea, vomiting and abdominal pain.  Neurological: Negative for weakness.  All other systems reviewed and are negative.    Allergies  Review of patient's allergies indicates no known allergies.  Home Medications   Current Outpatient Rx  Name Route Sig Dispense Refill  . ALBUTEROL SULFATE HFA 108 (90 BASE) MCG/ACT IN AERS Inhalation Inhale 2 puffs into the lungs every 4 (four) hours as needed for wheezing. 8.5 g 4  . ALBUTEROL SULFATE (2.5 MG/3ML) 0.083% IN NEBU  INHALE 1 VIAL VIA NEBULIZER EVERY 6 HOURS AS NEEDED 375 mL 2  .  BRIMONIDINE TARTRATE-TIMOLOL 0.2-0.5 % OP SOLN Both Eyes Place 1 drop into both eyes every 12 (twelve) hours.      . BUDESONIDE-FORMOTEROL FUMARATE 160-4.5 MCG/ACT IN AERO Inhalation Inhale 2 puffs into the lungs 2 (two) times daily. 1 Inhaler 3  . CARTIA XT 300 MG PO CP24  TAKE 1 CAPSULE BY MOUTH EVERY [name] 30 capsule 1  . CARVEDILOL 3.125 MG PO TABS  TAKE 1 TABLET BY MOUTH TWICE DAILY 60 tablet 2  . FUROSEMIDE 40 MG PO TABS Oral Take 40 mg by mouth every morning.    . FUROSEMIDE 40 MG PO TABS  TAKE 1 TABLET BY MOUTH TWICE DAILY 60 tablet 2  . POTASSIUM CHLORIDE ER 10 MEQ PO TBCR Oral Take 1 tablet (10 mEq total) by mouth daily after breakfast. 30  tablet 5    Discontinue potassium cl effective 02/28/201 ...  . PREDNISONE 10 MG PO TABS Oral Take 4 tablets (40 mg total) by mouth daily. 20 tablet 0  . SILVER SULFADIAZINE 1 % EX CREA Topical Apply topically 2 (two) times daily. 50 g 2  . SPIRIVA HANDIHALER 18 MCG IN CAPS  INHALE CONTENTS OF 1 CAPSULE USING HANDIHALER ONCE DAILY 30 capsule 2  . TIOTROPIUM BROMIDE MONOHYDRATE 18 MCG IN CAPS Inhalation Place 1 capsule (18 mcg total) into inhaler and inhale daily. 30 capsule 6  . TRAVOPROST 0.004 % OP SOLN Both Eyes Place 1 drop into both eyes at bedtime.     . WARFARIN SODIUM 4 MG PO TABS Oral Take 4-8 mg by mouth daily. Take 2 tablets (8 mg) on Tuesdays and Fridays, then take 1 tablet (4 mg) on Sundays, Mondays,Wednesdays,Thursdays,Saturdays    . WARFARIN SODIUM 4 MG PO TABS  Take 1 tablet daily except 2 tablets on Tuesdays and Fridays 45 tablet 2    Patient needs to call office and schedule office v ...    Triage Vitals: BP 154/58  Pulse 68  Temp 97.6 F (36.4 C) (Oral)  Resp 24  SpO2 92%  Physical Exam  Nursing note and vitals reviewed. Constitutional: He is oriented to person, place, and time. He appears well-developed and well-nourished. No distress.  HENT:  Head: Normocephalic and atraumatic.  Eyes: EOM are normal.  Neck: Neck supple. No tracheal deviation present.  Cardiovascular: Normal rate, regular rhythm and normal heart sounds.   No murmur heard. Pulmonary/Chest: Effort normal and breath sounds normal. No respiratory distress. He has no wheezes. He has no rales.  Abdominal: Soft. Bowel sounds are normal. He exhibits no distension. There is no tenderness. There is no rebound and no guarding.  Musculoskeletal: Normal range of motion. He exhibits edema (bilateral pittind edema). He exhibits no tenderness.  Lymphadenopathy:    He has no cervical adenopathy.  Neurological: He is alert and oriented to person, place, and time. No cranial nerve deficit. Coordination normal.        Cranial nerves intact 2-12.   Skin: Skin is warm and dry.  Psychiatric: He has a normal mood and affect. His behavior is normal.    ED Course  Procedures (including critical care time) DIAGNOSTIC STUDIES: Oxygen Saturation is 92% on room air, adequate by my interpretation.    COORDINATION OF CARE: At 630 PM Discussed treatment plan with patient which includes CXR, breathing treatment, prednisone, and blood work. Patient agrees.   Labs Reviewed  CBC WITH DIFFERENTIAL - Abnormal; Notable for the following:    Hemoglobin 12.4 (*)     Monocytes Relative 14 (*)  All other components within normal limits  BASIC METABOLIC PANEL - Abnormal; Notable for the following:    CO2 33 (*)     Glucose, Bld 116 (*)     GFR calc non Af Amer 81 (*)     All other components within normal limits   Dg Chest 2 View  12/28/2011  *RADIOLOGY REPORT*  Clinical Data: Shortness of breath and cough.  CHEST - 2 VIEW  Comparison: Chest x-ray 03/18/2011.  Findings: Lungs appear hyperexpanded with flattening of the hemidiaphragms, increased retrosternal air space and pruning of the pulmonary vasculature in the periphery, suggestive of underlying COPD.  No acute consolidative airspace disease.  Mild blunting of the costophrenic sulci is favored to reflect chronic pleural parenchymal scarring and is less likely indicative of trace bilateral pleural effusions.  No evidence of pulmonary edema.  Mild cardiomegaly is unchanged.  Atherosclerotic calcifications are noted within the arch of the aorta.  IMPRESSION: 1.  Chronic changes of COPD redemonstrated, without definite radiographic evidence of acute cardiopulmonary disease. 2.  Mild cardiomegaly is unchanged. 3.  Atherosclerosis.   Original Report Authenticated By: Florencia Reasons, M.D.    Results for orders placed during the hospital encounter of 12/28/11  CBC WITH DIFFERENTIAL      Component Value Range   WBC 6.0  4.0 - 10.5 K/uL   RBC 4.26  4.22 - 5.81  MIL/uL   Hemoglobin 12.4 (*) 13.0 - 17.0 g/dL   HCT 16.1  09.6 - 04.5 %   MCV 93.4  78.0 - 100.0 fL   MCH 29.1  26.0 - 34.0 pg   MCHC 31.2  30.0 - 36.0 g/dL   RDW 40.9  81.1 - 91.4 %   Platelets 228  150 - 400 K/uL   Neutrophils Relative 67  43 - 77 %   Neutro Abs 4.0  1.7 - 7.7 K/uL   Lymphocytes Relative 17  12 - 46 %   Lymphs Abs 1.0  0.7 - 4.0 K/uL   Monocytes Relative 14 (*) 3 - 12 %   Monocytes Absolute 0.8  0.1 - 1.0 K/uL   Eosinophils Relative 2  0 - 5 %   Eosinophils Absolute 0.1  0.0 - 0.7 K/uL   Basophils Relative 0  0 - 1 %   Basophils Absolute 0.0  0.0 - 0.1 K/uL  BASIC METABOLIC PANEL      Component Value Range   Sodium 143  135 - 145 mEq/L   Potassium 4.3  3.5 - 5.1 mEq/L   Chloride 100  96 - 112 mEq/L   CO2 33 (*) 19 - 32 mEq/L   Glucose, Bld 116 (*) 70 - 99 mg/dL   BUN 17  6 - 23 mg/dL   Creatinine, Ser 7.82  0.50 - 1.35 mg/dL   Calcium 9.9  8.4 - 95.6 mg/dL   GFR calc non Af Amer 81 (*) >90 mL/min   GFR calc Af Amer >90  >90 mL/min   already  1. COPD exacerbation       MDM  Patient with known history of COPD chest x-rays consistent with that no evidence of pulmonary edema. Patient received albuterol Atrovent nebulizer in the emergency Department breathing is improved. Question saturation on room air is 93%. Patient also received a first dose of prednisone in the emergency department. Will discharge home increase in the usage of his albuterol 2 every 6 hours. For the next 7 days. And we'll continue prednisone for the next 5 days.  I personally performed the services described in this documentation, which was scribed in my presence. The recorded information has been reviewed and considered.          Theodore Jakes, MD 12/28/11 1944  Theodore Jakes, MD 12/29/11 1055

## 2011-12-28 NOTE — Telephone Encounter (Signed)
Please let [facility] nurse, Marja Kays know that I saw her note about  Theodore [name] at the end of the day.  I had  considered prescribing the requested antibiotics, however, patient had managed to "find a way" to the ED before I was able to speak to him. His CXr and vitals suggested no infection, no antibiotics were prescribed, and he was discharged on steroid taper. Please let her know I appreciate the hard work and energy being put into this couple and will continue to  work with [facility] as best as we are able to

## 2011-12-29 ENCOUNTER — Telehealth: Payer: Self-pay | Admitting: Family Medicine

## 2011-12-29 NOTE — Telephone Encounter (Signed)
Spoke with [name] and she stated that she would go by and check on patient and see his status after he completes steroids.  May need to hold off on appt due to money issues.  She will keep [place] up to date.

## 2011-12-29 NOTE — Telephone Encounter (Signed)
Case worker aware.

## 2012-01-12 ENCOUNTER — Other Ambulatory Visit: Payer: Self-pay | Admitting: Family Medicine

## 2012-01-25 ENCOUNTER — Ambulatory Visit (INDEPENDENT_AMBULATORY_CARE_PROVIDER_SITE_OTHER): Payer: Medicare Other | Admitting: Family Medicine

## 2012-01-25 ENCOUNTER — Encounter: Payer: Self-pay | Admitting: Family Medicine

## 2012-01-25 ENCOUNTER — Ambulatory Visit (INDEPENDENT_AMBULATORY_CARE_PROVIDER_SITE_OTHER): Payer: Medicare Other | Admitting: *Deleted

## 2012-01-25 VITALS — BP 152/80 | HR 100 | Resp 18 | Ht 67.5 in | Wt 161.0 lb

## 2012-01-25 DIAGNOSIS — I10 Essential (primary) hypertension: Secondary | ICD-10-CM

## 2012-01-25 DIAGNOSIS — Z13220 Encounter for screening for lipoid disorders: Secondary | ICD-10-CM

## 2012-01-25 DIAGNOSIS — Z7901 Long term (current) use of anticoagulants: Secondary | ICD-10-CM

## 2012-01-25 DIAGNOSIS — I4891 Unspecified atrial fibrillation: Secondary | ICD-10-CM

## 2012-01-25 DIAGNOSIS — F17200 Nicotine dependence, unspecified, uncomplicated: Secondary | ICD-10-CM

## 2012-01-25 DIAGNOSIS — Z720 Tobacco use: Secondary | ICD-10-CM

## 2012-01-25 DIAGNOSIS — J449 Chronic obstructive pulmonary disease, unspecified: Secondary | ICD-10-CM

## 2012-01-25 DIAGNOSIS — R972 Elevated prostate specific antigen [PSA]: Secondary | ICD-10-CM

## 2012-01-25 DIAGNOSIS — Z125 Encounter for screening for malignant neoplasm of prostate: Secondary | ICD-10-CM

## 2012-01-25 NOTE — Progress Notes (Signed)
  Subjective:    Patient ID: Theodore [name], male    DOB: May 04, 1940, 71 y.o.   MRN: [id number]  HPI Last seen in E 10/17, got prednisone 20mg  daily for 5 days ,CXR showed no infiltrate,nad he is doing better Smoking 1 to 2 ciggs per day, understands the need to quit , but unwilling to commit at this time Breathing has improved, however, still not consistently using his symbicort inhaler , and doubtful that he is using spiriva. Re educated him about the need for both. Received his flu vaccine yesterday, no adverse effect. Denies leg swelling, PND or orthopnea   Review of Systems See HPI Denies recent fever or chills. Denies sinus pressure, nasal congestion, ear pain or sore throat. Denies chest congestion, productive cough has some wheezing and dyspnea, relies on daily  Denies chest pains, palpitations and leg swelling Denies abdominal pain, nausea, vomiting,diarrhea or constipation.   Denies dysuria, frequency, hesitancy or incontinence. Denies joint pain, swelling and limitation in mobility. Denies headaches, seizures, numbness, or tingling. Denies depression, anxiety or insomnia. Denies skin break down or rash.        Objective:   Physical Exam Patient alert and oriented and in no cardiopulmonary distress.  HEENT: No facial asymmetry, EOMI, no sinus tenderness,  oropharynx pink and moist.  Neck supple no adenopathy.  Chest: decreased air entry throughout, no crackles or wheezes  CVS: S1, S2 no murmurs, no S3.  ABD: Soft non tender. Bowel sounds normal.  Ext: No edema  MS: Adequate ROM spine, shoulders, hips and knees.  Skin: Intact, no ulcerations or rash noted.  Psych: Good eye contact, normal affect. Memory intact not anxious or depressed appearing.  CNS: CN 2-12 intact, power, tone and sensation normal throughout.        Assessment & Plan:

## 2012-01-25 NOTE — Patient Instructions (Addendum)
Annual wellness in 4 month, mid March  Call if you need me before  You NEED to use the symbicort and spiriva inhalers EVERY day, as prescribed, to help your breathing  You need to STOP smoking  Please try an dget to urology about your prostate early next year , the latest    Fasting lipid, chem 7, PSa,   March 1 or after

## 2012-01-28 ENCOUNTER — Encounter: Payer: Self-pay | Admitting: Family Medicine

## 2012-01-28 NOTE — Assessment & Plan Note (Signed)
Currently rate controlled and maintained on coumadin through the clinic

## 2012-01-28 NOTE — Assessment & Plan Note (Signed)
Controlled, no change in medication DASH diet and commitment to daily physical activity for a minimum of 30 minutes discussed and encouraged, as a part of hypertension management. The importance of attaining a healthy weight is also discussed.  

## 2012-01-28 NOTE — Assessment & Plan Note (Signed)
Urology f/u needs to be pursued, states when he was seen in an office because of lack of co pay, no services were rendered, will contact [facility] and ask to follow up on this

## 2012-01-28 NOTE — Assessment & Plan Note (Signed)
Re educated re the need to stop smoking as well as to the correct use of inhalers

## 2012-01-28 NOTE — Assessment & Plan Note (Signed)

## 2012-01-31 ENCOUNTER — Other Ambulatory Visit: Payer: Self-pay

## 2012-01-31 MED ORDER — FUROSEMIDE 40 MG PO TABS: 40.0000 mg | ORAL_TABLET | Freq: Every day | ORAL | Status: DC

## 2012-01-31 MED ORDER — BUDESONIDE-FORMOTEROL FUMARATE 160-4.5 MCG/ACT IN AERO: 2.0000 | INHALATION_SPRAY | Freq: Two times a day (BID) | RESPIRATORY_TRACT | Status: DC

## 2012-01-31 MED ORDER — TIOTROPIUM BROMIDE MONOHYDRATE 18 MCG IN CAPS: 18.0000 ug | ORAL_CAPSULE | Freq: Every day | RESPIRATORY_TRACT | Status: DC

## 2012-01-31 MED ORDER — DILTIAZEM HCL ER COATED BEADS 300 MG PO CP24: 300.0000 mg | ORAL_CAPSULE | Freq: Every day | ORAL | Status: DC

## 2012-01-31 MED ORDER — ALBUTEROL SULFATE HFA 108 (90 BASE) MCG/ACT IN AERS: 2.0000 | INHALATION_SPRAY | RESPIRATORY_TRACT | Status: DC | PRN

## 2012-02-05 ENCOUNTER — Other Ambulatory Visit: Payer: Self-pay | Admitting: Family Medicine

## 2012-03-15 ENCOUNTER — Telehealth: Payer: Self-pay

## 2012-03-15 ENCOUNTER — Other Ambulatory Visit: Payer: Self-pay | Admitting: Family Medicine

## 2012-03-15 ENCOUNTER — Encounter (HOSPITAL_COMMUNITY): Payer: Self-pay | Admitting: *Deleted

## 2012-03-15 ENCOUNTER — Inpatient Hospital Stay (HOSPITAL_COMMUNITY)
Admission: EM | Admit: 2012-03-15 | Discharge: 2012-03-19 | DRG: 191 | Disposition: A | Discharge status: Home Health | Payer: Medicare Other | Attending: Internal Medicine | Admitting: Internal Medicine

## 2012-03-15 ENCOUNTER — Emergency Department (HOSPITAL_COMMUNITY): Payer: Medicare Other

## 2012-03-15 DIAGNOSIS — J449 Chronic obstructive pulmonary disease, unspecified: Secondary | ICD-10-CM

## 2012-03-15 DIAGNOSIS — H409 Unspecified glaucoma: Secondary | ICD-10-CM | POA: Diagnosis present

## 2012-03-15 DIAGNOSIS — I498 Other specified cardiac arrhythmias: Secondary | ICD-10-CM | POA: Diagnosis present

## 2012-03-15 DIAGNOSIS — J209 Acute bronchitis, unspecified: Secondary | ICD-10-CM | POA: Diagnosis present

## 2012-03-15 DIAGNOSIS — J440 Chronic obstructive pulmonary disease with acute lower respiratory infection: Secondary | ICD-10-CM | POA: Diagnosis present

## 2012-03-15 DIAGNOSIS — R0902 Hypoxemia: Secondary | ICD-10-CM | POA: Diagnosis present

## 2012-03-15 DIAGNOSIS — D649 Anemia, unspecified: Secondary | ICD-10-CM | POA: Diagnosis present

## 2012-03-15 DIAGNOSIS — I509 Heart failure, unspecified: Secondary | ICD-10-CM | POA: Diagnosis present

## 2012-03-15 DIAGNOSIS — I5030 Unspecified diastolic (congestive) heart failure: Secondary | ICD-10-CM | POA: Diagnosis present

## 2012-03-15 DIAGNOSIS — Z7901 Long term (current) use of anticoagulants: Secondary | ICD-10-CM

## 2012-03-15 DIAGNOSIS — Z87891 Personal history of nicotine dependence: Secondary | ICD-10-CM

## 2012-03-15 DIAGNOSIS — I2789 Other specified pulmonary heart diseases: Secondary | ICD-10-CM | POA: Diagnosis present

## 2012-03-15 DIAGNOSIS — J441 Chronic obstructive pulmonary disease with (acute) exacerbation: Principal | ICD-10-CM | POA: Diagnosis present

## 2012-03-15 DIAGNOSIS — R001 Bradycardia, unspecified: Secondary | ICD-10-CM

## 2012-03-15 DIAGNOSIS — I10 Essential (primary) hypertension: Secondary | ICD-10-CM

## 2012-03-15 DIAGNOSIS — Z79899 Other long term (current) drug therapy: Secondary | ICD-10-CM

## 2012-03-15 DIAGNOSIS — I4891 Unspecified atrial fibrillation: Secondary | ICD-10-CM | POA: Diagnosis present

## 2012-03-15 HISTORY — DX: Unspecified asthma, uncomplicated: J45.909

## 2012-03-15 LAB — CBC WITH DIFFERENTIAL/PLATELET
Eosinophils Absolute: 0.1 10*3/uL (ref 0.0–0.7)
HCT: 39.9 % (ref 39.0–52.0)
Hemoglobin: 12.4 g/dL — ABNORMAL LOW (ref 13.0–17.0)
Lymphs Abs: 0.5 10*3/uL — ABNORMAL LOW (ref 0.7–4.0)
MCH: 29.2 pg (ref 26.0–34.0)
Monocytes Absolute: 1.2 10*3/uL — ABNORMAL HIGH (ref 0.1–1.0)
Monocytes Relative: 22 % — ABNORMAL HIGH (ref 3–12)
Neutrophils Relative %: 67 % (ref 43–77)
RBC: 4.24 MIL/uL (ref 4.22–5.81)

## 2012-03-15 LAB — URINE MICROSCOPIC-ADD ON

## 2012-03-15 LAB — COMPREHENSIVE METABOLIC PANEL
Alkaline Phosphatase: 86 U/L (ref 39–117)
BUN: 25 mg/dL — ABNORMAL HIGH (ref 6–23)
Chloride: 97 mEq/L (ref 96–112)
Creatinine, Ser: 1.07 mg/dL (ref 0.50–1.35)
GFR calc Af Amer: 78 mL/min — ABNORMAL LOW (ref >90)
Glucose, Bld: 113 mg/dL — ABNORMAL HIGH (ref 70–99)
Potassium: 3.7 mEq/L (ref 3.5–5.1)
Total Bilirubin: 0.6 mg/dL (ref 0.3–1.2)
Total Protein: 7.7 g/dL (ref 6.0–8.3)

## 2012-03-15 LAB — URINALYSIS, ROUTINE W REFLEX MICROSCOPIC
Bilirubin Urine: NEGATIVE
Protein, ur: 100 mg/dL — AB
Urobilinogen, UA: 2 mg/dL — ABNORMAL HIGH (ref 0.0–1.0)

## 2012-03-15 LAB — TROPONIN I: Troponin I: <0.3 ng/mL (ref <0.30)

## 2012-03-15 MED ORDER — BRIMONIDINE TARTRATE 0.2 % OP SOLN
OPHTHALMIC | Status: AC
  Filled 2012-03-15: qty 5

## 2012-03-15 MED ORDER — GUAIFENESIN ER 600 MG PO TB12
600.0000 mg | ORAL_TABLET | Freq: Two times a day (BID) | ORAL | Status: DC
  Administered 2012-03-15: 600 mg via ORAL
  Filled 2012-03-15 (×2): qty 1

## 2012-03-15 MED ORDER — PREDNISONE (PAK) 5 MG PO TABS: 5.0000 mg | ORAL_TABLET | ORAL | Status: DC

## 2012-03-15 MED ORDER — FUROSEMIDE 10 MG/ML IJ SOLN
40.0000 mg | Freq: Once | INTRAMUSCULAR | Status: AC
Start: 2012-03-15 — End: 2012-03-15
  Administered 2012-03-15: 40 mg via INTRAVENOUS
  Filled 2012-03-15: qty 4

## 2012-03-15 MED ORDER — IPRATROPIUM BROMIDE 0.02 % IN SOLN
0.5000 mg | Freq: Once | RESPIRATORY_TRACT | Status: AC
  Administered 2012-03-15: 0.5 mg via RESPIRATORY_TRACT
  Filled 2012-03-15: qty 2.5

## 2012-03-15 MED ORDER — LEVALBUTEROL HCL 0.63 MG/3ML IN NEBU
0.6300 mg | INHALATION_SOLUTION | Freq: Four times a day (QID) | RESPIRATORY_TRACT | Status: DC
  Administered 2012-03-16 – 2012-03-19 (×15): 0.63 mg via RESPIRATORY_TRACT
  Filled 2012-03-15 (×16): qty 3

## 2012-03-15 MED ORDER — WARFARIN - PHARMACIST DOSING INPATIENT: Freq: Every day | Status: DC

## 2012-03-15 MED ORDER — SODIUM CHLORIDE 0.9 % IV SOLN
Freq: Once | INTRAVENOUS | Status: AC
  Administered 2012-03-15: 20:00:00 via INTRAVENOUS

## 2012-03-15 MED ORDER — ACETAMINOPHEN 650 MG RE SUPP: 650.0000 mg | Freq: Four times a day (QID) | RECTAL | Status: DC | PRN

## 2012-03-15 MED ORDER — BRIMONIDINE TARTRATE 0.2 % OP SOLN
1.0000 [drp] | Freq: Two times a day (BID) | OPHTHALMIC | Status: DC
  Administered 2012-03-15 – 2012-03-19 (×7): 1 [drp] via OPHTHALMIC
  Filled 2012-03-15: qty 5

## 2012-03-15 MED ORDER — OXYCODONE HCL 5 MG PO TABS
5.0000 mg | ORAL_TABLET | ORAL | Status: DC | PRN
  Administered 2012-03-17 (×3): 5 mg via ORAL
  Filled 2012-03-15 (×3): qty 1

## 2012-03-15 MED ORDER — LEVALBUTEROL HCL 0.63 MG/3ML IN NEBU
0.6300 mg | INHALATION_SOLUTION | RESPIRATORY_TRACT | Status: DC | PRN
  Administered 2012-03-16: 0.63 mg via RESPIRATORY_TRACT

## 2012-03-15 MED ORDER — BUDESONIDE-FORMOTEROL FUMARATE 160-4.5 MCG/ACT IN AERO
2.0000 | INHALATION_SPRAY | Freq: Two times a day (BID) | RESPIRATORY_TRACT | Status: DC
  Administered 2012-03-16 – 2012-03-19 (×7): 2 via RESPIRATORY_TRACT
  Filled 2012-03-15: qty 6

## 2012-03-15 MED ORDER — ONDANSETRON HCL 4 MG/2ML IJ SOLN: 4.0000 mg | Freq: Four times a day (QID) | INTRAMUSCULAR | Status: DC | PRN

## 2012-03-15 MED ORDER — ALBUTEROL SULFATE (5 MG/ML) 0.5% IN NEBU
2.5000 mg | INHALATION_SOLUTION | Freq: Once | RESPIRATORY_TRACT | Status: AC
  Administered 2012-03-15: 2.5 mg via RESPIRATORY_TRACT
  Filled 2012-03-15: qty 0.5

## 2012-03-15 MED ORDER — LEVALBUTEROL HCL 0.63 MG/3ML IN NEBU
0.6300 mg | INHALATION_SOLUTION | RESPIRATORY_TRACT | Status: DC
  Administered 2012-03-15: 0.63 mg via RESPIRATORY_TRACT
  Filled 2012-03-15: qty 3

## 2012-03-15 MED ORDER — SODIUM CHLORIDE 0.9 % IJ SOLN
3.0000 mL | Freq: Two times a day (BID) | INTRAMUSCULAR | Status: DC
  Administered 2012-03-16 – 2012-03-19 (×4): 3 mL via INTRAVENOUS

## 2012-03-15 MED ORDER — METHYLPREDNISOLONE SODIUM SUCC 125 MG IJ SOLR: 60.0000 mg | Freq: Four times a day (QID) | INTRAMUSCULAR | Status: DC

## 2012-03-15 MED ORDER — TIOTROPIUM BROMIDE MONOHYDRATE 18 MCG IN CAPS
18.0000 ug | ORAL_CAPSULE | Freq: Every day | RESPIRATORY_TRACT | Status: DC
  Administered 2012-03-16 – 2012-03-19 (×4): 18 ug via RESPIRATORY_TRACT
  Filled 2012-03-15 (×2): qty 5

## 2012-03-15 MED ORDER — METHYLPREDNISOLONE SODIUM SUCC 125 MG IJ SOLR
60.0000 mg | Freq: Four times a day (QID) | INTRAMUSCULAR | Status: DC
  Administered 2012-03-15 – 2012-03-16 (×2): 60 mg via INTRAVENOUS
  Filled 2012-03-15 (×3): qty 2

## 2012-03-15 MED ORDER — DILTIAZEM HCL ER BEADS 300 MG PO CP24
300.0000 mg | ORAL_CAPSULE | Freq: Every day | ORAL | Status: DC
  Administered 2012-03-15: 300 mg via ORAL
  Filled 2012-03-15 (×2): qty 1

## 2012-03-15 MED ORDER — ONDANSETRON HCL 4 MG PO TABS: 4.0000 mg | ORAL_TABLET | Freq: Four times a day (QID) | ORAL | Status: DC | PRN

## 2012-03-15 MED ORDER — TIMOLOL MALEATE 0.5 % OP SOLN
1.0000 [drp] | Freq: Two times a day (BID) | OPHTHALMIC | Status: DC
  Administered 2012-03-16 – 2012-03-19 (×7): 1 [drp] via OPHTHALMIC
  Filled 2012-03-15 (×2): qty 5

## 2012-03-15 MED ORDER — CARVEDILOL 3.125 MG PO TABS
3.1250 mg | ORAL_TABLET | Freq: Two times a day (BID) | ORAL | Status: DC
  Administered 2012-03-15: 3.125 mg via ORAL
  Filled 2012-03-15 (×2): qty 1

## 2012-03-15 MED ORDER — SODIUM CHLORIDE 0.9 % IV SOLN: INTRAVENOUS | Status: DC

## 2012-03-15 MED ORDER — GUAIFENESIN-DM 100-10 MG/5ML PO SYRP
5.0000 mL | ORAL_SOLUTION | ORAL | Status: DC | PRN
  Administered 2012-03-17: 5 mL via ORAL
  Filled 2012-03-15: qty 5

## 2012-03-15 MED ORDER — SODIUM CHLORIDE 0.9 % IV SOLN: 250.0000 mL | INTRAVENOUS | Status: DC | PRN

## 2012-03-15 MED ORDER — LEVOFLOXACIN IN D5W 500 MG/100ML IV SOLN
500.0000 mg | INTRAVENOUS | Status: DC
  Filled 2012-03-15 (×2): qty 100

## 2012-03-15 MED ORDER — BRIMONIDINE TARTRATE-TIMOLOL 0.2-0.5 % OP SOLN: 1.0000 [drp] | Freq: Two times a day (BID) | OPHTHALMIC | Status: DC

## 2012-03-15 MED ORDER — ACETAMINOPHEN 325 MG PO TABS: 650.0000 mg | ORAL_TABLET | Freq: Four times a day (QID) | ORAL | Status: DC | PRN

## 2012-03-15 MED ORDER — ALBUTEROL SULFATE (5 MG/ML) 0.5% IN NEBU: 2.5000 mg | INHALATION_SOLUTION | RESPIRATORY_TRACT | Status: DC | PRN

## 2012-03-15 MED ORDER — FUROSEMIDE 40 MG PO TABS
40.0000 mg | ORAL_TABLET | Freq: Every day | ORAL | Status: DC
  Administered 2012-03-16 – 2012-03-19 (×4): 40 mg via ORAL
  Filled 2012-03-15 (×4): qty 1

## 2012-03-15 MED ORDER — WARFARIN SODIUM 7.5 MG PO TABS
7.5000 mg | ORAL_TABLET | Freq: Once | ORAL | Status: AC
  Administered 2012-03-15: 7.5 mg via ORAL
  Filled 2012-03-15: qty 1

## 2012-03-15 MED ORDER — LEVOFLOXACIN IN D5W 500 MG/100ML IV SOLN
500.0000 mg | Freq: Once | INTRAVENOUS | Status: AC
  Administered 2012-03-15: 500 mg via INTRAVENOUS
  Filled 2012-03-15: qty 100

## 2012-03-15 MED ORDER — POTASSIUM CHLORIDE CRYS ER 10 MEQ PO TBCR
10.0000 meq | EXTENDED_RELEASE_TABLET | Freq: Every day | ORAL | Status: DC
  Administered 2012-03-16 – 2012-03-19 (×4): 10 meq via ORAL
  Filled 2012-03-15 (×7): qty 1

## 2012-03-15 MED ORDER — SODIUM CHLORIDE 0.9 % IJ SOLN
3.0000 mL | Freq: Two times a day (BID) | INTRAMUSCULAR | Status: DC
  Administered 2012-03-17 – 2012-03-18 (×3): 3 mL via INTRAVENOUS
  Filled 2012-03-15: qty 3

## 2012-03-15 MED ORDER — ONDANSETRON HCL 4 MG/2ML IJ SOLN: 4.0000 mg | Freq: Three times a day (TID) | INTRAMUSCULAR | Status: DC | PRN

## 2012-03-15 MED ORDER — TRAVOPROST 0.004 % OP SOLN
1.0000 [drp] | Freq: Every day | OPHTHALMIC | Status: DC
  Administered 2012-03-16 – 2012-03-18 (×3): 1 [drp] via OPHTHALMIC
  Filled 2012-03-15 (×4): qty 0.1

## 2012-03-15 MED ORDER — METHYLPREDNISOLONE SODIUM SUCC 125 MG IJ SOLR
125.0000 mg | Freq: Once | INTRAMUSCULAR | Status: AC
  Administered 2012-03-15: 125 mg via INTRAVENOUS
  Filled 2012-03-15: qty 2

## 2012-03-15 MED ORDER — SODIUM CHLORIDE 0.9 % IJ SOLN: 3.0000 mL | INTRAMUSCULAR | Status: DC | PRN

## 2012-03-15 MED ORDER — LEVOFLOXACIN IN D5W 500 MG/100ML IV SOLN: 500.0000 mg | INTRAVENOUS | Status: DC

## 2012-03-15 MED ORDER — BIOTENE DRY MOUTH MT LIQD
15.0000 mL | Freq: Two times a day (BID) | OROMUCOSAL | Status: DC
  Administered 2012-03-15 – 2012-03-18 (×7): 15 mL via OROMUCOSAL

## 2012-03-15 NOTE — ED Provider Notes (Addendum)
History   This chart was scribed for Glynn Octave, MD by Gerlean Ren, ED Scribe. This patient was seen in room APA11/APA11 and the patient's care was started at 3:46 PM    CSN: [id number]  Arrival date & time 03/15/12  1533   First MD Initiated Contact with Patient 03/15/12 1542      Chief Complaint  Patient presents with  . Shortness of Breath    The history is provided by the patient. No language interpreter was used.   Theodore [name] is a 72 y.o. male with h/o anemia, CHF, atrial fibrillation, HTN, and COPD who presents to the Emergency Department complaining of one week of gradually worsening cough producing white phlegm with associated dyspnea preventing pt from walking long distances, which the social worker present in room states he can normally do with ease.  Pt denies chest pain, dizziness.  Pt states swelling in legs is normal to baseline.  Pt reports decreased appetite.  Pt is not on at-home oxygen.  Pt has not been hospitalized in at least 2 months.  Pt has received annual flu shot.  Pt has Symbicort inhaler taken b.i.d. and albuterol inhaler that he uses as needed that he has been using regularly.  Pt lives with his wife. Pt states he quit smoking tobacco 3 weeks ago and denies alcohol use. PCP is Dr. Lodema Hong. Past Medical History  Diagnosis Date  . Anemia     Hemoglobin 11.6 in 04/2008->resolved   . Edema     Lower Extremity  . Tobacco abuse   . Glaucoma(365)   . Allergic rhinitis   . Atrial fibrillation     Coumadin;asymptomatic bradycardia on telemetry in 12/2009  . CHF (congestive heart failure)     H/o CHF with preserved LV EF; pulmonary hypertension; normal EF in 03/2009  . Hypertension   . Shingles   . Shortness of breath   . COPD (chronic obstructive pulmonary disease)   . Asthma     Past Surgical History  Procedure Date  . Bilateral cataract surgery   . Herniorrhapy   . Tendon repair     right hand surgical procedure for a tendon repair    Family  History  Problem Relation Age of Onset  . Cancer Mother   . Cancer Father   . Coronary artery disease Brother     History  Substance Use Topics  . Smoking status: Current Some Day Smoker -- 0.2 packs/day for .5 years    Types: Cigarettes  . Smokeless tobacco: Never Used  . Alcohol Use: No      Review of Systems A complete 10 system review of systems was obtained and all systems are negative except as noted in the HPI and PMH.   Allergies  Review of patient's allergies indicates no known allergies.  Home Medications   Current Outpatient Rx  Name  Route  Sig  Dispense  Refill  . ALBUTEROL SULFATE (2.5 MG/3ML) 0.083% IN NEBU   Nebulization   Take 2.5 mg by nebulization every 6 (six) hours as needed. For shortness of breath         . CARVEDILOL 3.125 MG PO TABS   Oral   Take 3.125 mg by mouth 2 (two) times daily.         Marland Kitchen PREDNISONE (PAK) 5 MG PO TABS   Oral   Take 5 mg by mouth as directed. (6,5,4,3,2,1)         . ALBUTEROL SULFATE HFA 108 (90 BASE)  MCG/ACT IN AERS   Inhalation   Inhale 2 puffs into the lungs every 4 (four) hours as needed for wheezing.   8.5 g   3   . BRIMONIDINE TARTRATE-TIMOLOL 0.2-0.5 % OP SOLN   Both Eyes   Place 1 drop into both eyes every 12 (twelve) hours.           . BUDESONIDE-FORMOTEROL FUMARATE 160-4.5 MCG/ACT IN AERO   Inhalation   Inhale 2 puffs into the lungs 2 (two) times daily.   10.2 g   3   . DILTIAZEM HCL ER COATED BEADS 300 MG PO CP24   Oral   Take 1 capsule (300 mg total) by mouth daily.   30 capsule   3   . FUROSEMIDE 40 MG PO TABS   Oral   Take 1 tablet (40 mg total) by mouth daily.   60 tablet   3   . POTASSIUM CHLORIDE ER 10 MEQ PO TBCR   Oral   Take 1 tablet (10 mEq total) by mouth daily after breakfast.   30 tablet   5     Discontinue potassium cl effective 02/28/201 ...   . TIOTROPIUM BROMIDE MONOHYDRATE 18 MCG IN CAPS   Inhalation   Place 1 capsule (18 mcg total) into inhaler and  inhale daily.   30 capsule   3   . TRAVOPROST 0.004 % OP SOLN   Both Eyes   Place 1 drop into both eyes at bedtime.          . WARFARIN SODIUM 4 MG PO TABS   Oral   Take 4-8 mg by mouth daily. Take 2 tablets (8 mg) on Tuesdays and Fridays, then take 1 tablet (4 mg) on Sundays, Mondays,Wednesdays,Thursdays,Saturdays           BP 132/88  Pulse 68  Temp 98.4 F (36.9 C) (Oral)  Resp 24  SpO2 91%  Physical Exam  Nursing note and vitals reviewed. Constitutional: He is oriented to person, place, and time. He appears well-developed and well-nourished.  HENT:  Head: Normocephalic and atraumatic.  Eyes: Conjunctivae normal are normal.  Neck: Normal range of motion. No tracheal deviation present.  Cardiovascular: Normal rate, regular rhythm and normal heart sounds.   Pulmonary/Chest: No respiratory distress. He has wheezes.       Pursed lip breathing, speaking in full sentences, hoarse breath sounds with expiratory wheezes  Abdominal: Soft. Bowel sounds are normal. There is no tenderness.  Musculoskeletal: Normal range of motion.       Trace lower extremity edema.  Neurological: He is alert and oriented to person, place, and time.  Skin: Skin is warm and dry.  Psychiatric: He has a normal mood and affect.    ED Course  Procedures (including critical care time) DIAGNOSTIC STUDIES: Oxygen Saturation is 91% on Rhea, low by my interpretation.    COORDINATION OF CARE: 3:53 PM- Patient informed of clinical course, understands medical decision-making process, and agrees with plan.  Ordered Proventil nebulizer, Atrovent inhaler, solu-medrol, BNP, urinalysis, CBC, c-met, troponin I, and chest XR. 4:58 PM- Pt just returned from XR, increased wheezing with good air exchange.  Results for orders placed during the hospital encounter of 03/15/12  CBC WITH DIFFERENTIAL      Component Value Range   WBC 5.5  4.0 - 10.5 K/uL   RBC 4.24  4.22 - 5.81 MIL/uL   Hemoglobin 12.4 (*) 13.0 - 17.0  g/dL   HCT 16.1  09.6 - 04.5 %  MCV 94.1  78.0 - 100.0 fL   MCH 29.2  26.0 - 34.0 pg   MCHC 31.1  30.0 - 36.0 g/dL   RDW 45.4  09.8 - 11.9 %   Platelets 216  150 - 400 K/uL   Neutrophils Relative 67  43 - 77 %   Neutro Abs 3.7  1.7 - 7.7 K/uL   Lymphocytes Relative 10 (*) 12 - 46 %   Lymphs Abs 0.5 (*) 0.7 - 4.0 K/uL   Monocytes Relative 22 (*) 3 - 12 %   Monocytes Absolute 1.2 (*) 0.1 - 1.0 K/uL   Eosinophils Relative 1  0 - 5 %   Eosinophils Absolute 0.1  0.0 - 0.7 K/uL   Basophils Relative 0  0 - 1 %   Basophils Absolute 0.0  0.0 - 0.1 K/uL  COMPREHENSIVE METABOLIC PANEL      Component Value Range   Sodium 139  135 - 145 mEq/L   Potassium 3.7  3.5 - 5.1 mEq/L   Chloride 97  96 - 112 mEq/L   CO2 33 (*) 19 - 32 mEq/L   Glucose, Bld 113 (*) 70 - 99 mg/dL   BUN 25 (*) 6 - 23 mg/dL   Creatinine, Ser 1.47  0.50 - 1.35 mg/dL   Calcium 8.8  8.4 - 82.9 mg/dL   Total Protein 7.7  6.0 - 8.3 g/dL   Albumin 3.5  3.5 - 5.2 g/dL   AST 30  0 - 37 U/L   ALT 15  0 - 53 U/L   Alkaline Phosphatase 86  39 - 117 U/L   Total Bilirubin 0.6  0.3 - 1.2 mg/dL   GFR calc non Af Amer 67 (*) >90 mL/min   GFR calc Af Amer 78 (*) >90 mL/min  TROPONIN I      Component Value Range   Troponin I <0.30  <0.30 ng/mL  PRO B NATRIURETIC PEPTIDE      Component Value Range   Pro B Natriuretic peptide (BNP) 2263.0 (*) 0 - 125 pg/mL  URINALYSIS, ROUTINE W REFLEX MICROSCOPIC      Component Value Range   Color, Urine YELLOW  YELLOW   APPearance CLEAR  CLEAR   Specific Gravity, Urine >1.030 (*) 1.005 - 1.030   pH 5.5  5.0 - 8.0   Glucose, UA NEGATIVE  NEGATIVE mg/dL   Hgb urine dipstick TRACE (*) NEGATIVE   Bilirubin Urine NEGATIVE  NEGATIVE   Ketones, ur NEGATIVE  NEGATIVE mg/dL   Protein, ur 562 (*) NEGATIVE mg/dL   Urobilinogen, UA 2.0 (*) 0.0 - 1.0 mg/dL   Nitrite NEGATIVE  NEGATIVE   Leukocytes, UA NEGATIVE  NEGATIVE  PROTIME-INR      Component Value Range   Prothrombin Time 15.4 (*) 11.6 - 15.2  seconds   INR 1.24  0.00 - 1.49  D-DIMER, QUANTITATIVE      Component Value Range   D-Dimer, Quant 0.42  0.00 - 0.48 ug/mL-FEU  URINE MICROSCOPIC-ADD ON      Component Value Range   Squamous Epithelial / LPF RARE  RARE   RBC / HPF 0-2  <3 RBC/hpf   Bacteria, UA RARE  RARE    Dg Chest 2 View  03/15/2012  *RADIOLOGY REPORT*  Clinical Data: Cough and fever, shortness of breath  CHEST - 2 VIEW  Comparison: 12/28/2011  Findings: Colon projects over the upper abdomen.  Mild moderate enlargement of the cardiomediastinal silhouette is stable.  The lungs are clear.  No  pleural effusion.  No acute osseous finding.  IMPRESSION: Mild to moderate cardiomegaly, no acute cardiopulmonary process.   Original Report Authenticated By: Christiana Pellant, M.D.      No diagnosis found.    MDM  History of COPD present with one week of difficulty breathing, productive cough, increased inhaler use, dyspnea on exertion. No chest pain, fever, abdominal pain nausea vomiting.  Chest x-ray shows cardiomegaly without acute process. Patient is subtherapeutic on his Coumadin will add d-dimer to labs.  Suspect COPD exacerbation causing increased work or breathing or hypoxia. Will rule out PE. Does not appear to be volume overloaded.  D-dimer negative. Continue nebulizers, steroids, levaquin. On admission for COPD exacerbation hypoxia. He desaturates in the 70s with walking.  D/w Dr. Lendell Caprice.    Date: 03/15/2012  Rate: 82  Rhythm: atrial fibrillation  QRS Axis: left  Intervals: normal  ST/T Wave abnormalities: normal  Conduction Disutrbances:none  Narrative Interpretation: Unchanged inferior lateral Q waves  Old EKG Reviewed: unchanged    CRITICAL CARE Performed by: Glynn Octave   Total critical care time: 30  Critical care time was exclusive of separately billable procedures and treating other patients.  Critical care was necessary to treat or prevent imminent or life-threatening  deterioration.  Critical care was time spent personally by me on the following activities: development of treatment plan with patient and/or surrogate as well as nursing, discussions with consultants, evaluation of patient's response to treatment, examination of patient, obtaining history from patient or surrogate, ordering and performing treatments and interventions, ordering and review of laboratory studies, ordering and review of radiographic studies, pulse oximetry and re-evaluation of patient's condition.  I personally performed the services described in this documentation, which was scribed in my presence. The recorded information has been reviewed and is accurate.         Glynn Octave, MD 03/15/12 1916  Glynn Octave, MD 03/15/12 [id number]

## 2012-03-15 NOTE — Telephone Encounter (Signed)
Seems uncontrolled des[ppite optimal therapy. I recommend pred dose pack and referral to Dr Juanetta Gosling re uncontrolled symptoms. I will send the pred dose pack in and enter the referral. Also any assistance that they can arrange to help with heating , would be a great help, if they are able

## 2012-03-15 NOTE — H&P (Signed)
[facility] History and Physical  Jden Want Mountjoy ZOX:[id number] DOB: Sep 30, 1940 DOA: 03/15/2012   PCP: Syliva Overman, MD  Specialists: He is followed by Dr. Dietrich Pates, with cardiology  Chief Complaint: Shortness of breath for the last 3 days  HPI: Theodore [name] is a 72 y.o. male with a past medical history of atrial fibrillation, emphysema, history of tobacco abuse, who quit smoking about 4 weeks ago, who was in his usual state of health about 3 days ago, when he started having a cough. He looks like he spoke to his primary care physician and was prescribed steroids recently. He had similar episode back in October and was prescribed steroids. His primary care physician has been working with him regarding smoking cessation. And he finally quit smoking 4 weeks ago. He tells me that his wife had a "stomach bug" last week. He started having cough and shortness of breath 3 days ago. He has been feeling weak. Denies any fever. He's been coughing up whitish expectoration. Denies any fever or chills. Denies any chest pain. Has some swelling in his legs for which he takes diuretics. Denies using oxygen at home. He has been wheezing as well. He's taken the breathing treatments, as well as inhalers at home, with no effect. Since he wasn't improving, he decided to come in to the hospital. After receiving nebulizer treatments here in the ED, he is feeling a little better.  Home Medications: Prior to Admission medications   Medication Sig Start Date End Date Taking? Authorizing Provider  albuterol (PROVENTIL) (2.5 MG/3ML) 0.083% nebulizer solution Take 2.5 mg by nebulization every 6 (six) hours as needed. For shortness of breath   Yes Historical Provider, MD  carvedilol (COREG) 3.125 MG tablet Take 3.125 mg by mouth 2 (two) times daily.   Yes Historical Provider, MD  predniSONE (STERAPRED UNI-PAK) 5 MG TABS Take 5 mg by mouth as directed. [phone]) 03/15/12  Yes Kerri Perches, MD  albuterol  (PROAIR HFA) 108 (90 BASE) MCG/ACT inhaler Inhale 2 puffs into the lungs every 4 (four) hours as needed for wheezing. 01/31/12   Kerri Perches, MD  brimonidine-timolol (COMBIGAN) 0.2-0.5 % ophthalmic solution Place 1 drop into both eyes every 12 (twelve) hours.      Historical Provider, MD  budesonide-formoterol (SYMBICORT) 160-4.5 MCG/ACT inhaler Inhale 2 puffs into the lungs 2 (two) times daily. 01/31/12   Kerri Perches, MD  diltiazem (CARTIA XT) 300 MG 24 hr capsule Take 1 capsule (300 mg total) by mouth daily. 01/31/12   Kerri Perches, MD  furosemide (LASIX) 40 MG tablet Take 1 tablet (40 mg total) by mouth daily. 01/31/12   Kerri Perches, MD  potassium chloride (KLOR-CON 10) 10 MEQ tablet Take 1 tablet (10 mEq total) by mouth daily after breakfast. 05/11/11 05/10/12  Kerri Perches, MD  tiotropium (SPIRIVA HANDIHALER) 18 MCG inhalation capsule Place 1 capsule (18 mcg total) into inhaler and inhale daily. 01/31/12   Kerri Perches, MD  travoprost, benzalkonium, (TRAVATAN Z) 0.004 % ophthalmic solution Place 1 drop into both eyes at bedtime.     Historical Provider, MD  warfarin (COUMADIN) 4 MG tablet Take 4-8 mg by mouth daily. Take 2 tablets (8 mg) on Tuesdays and Fridays, then take 1 tablet (4 mg) on Sundays, Mondays,Wednesdays,Thursdays,Saturdays 05/24/11   Kathlen Brunswick, MD    Allergies: No Known Allergies  Past Medical History: Past Medical History  Diagnosis Date  . Anemia     Hemoglobin 11.6 in 04/2008->resolved   .  Edema     Lower Extremity  . Tobacco abuse   . Glaucoma(365)   . Allergic rhinitis   . Atrial fibrillation     Coumadin;asymptomatic bradycardia on telemetry in 12/2009  . CHF (congestive heart failure)     H/o CHF with preserved LV EF; pulmonary hypertension; normal EF in 03/2009  . Hypertension   . Shingles   . Shortness of breath   . COPD (chronic obstructive pulmonary disease)   . Asthma     Past Surgical History  Procedure Date   . Bilateral cataract surgery   . Herniorrhapy   . Tendon repair     right hand surgical procedure for a tendon repair    Social History:  reports that he quit smoking about 4 weeks ago. His smoking use included Cigarettes. He has a 27.5 pack-year smoking history. He has never used smokeless tobacco. He reports that he does not drink alcohol or use illicit drugs.  Living Situation: Lives with his wife Activity Level: Usually independent with his daily activities   Family History:  Family History  Problem Relation Age of Onset  . Cancer Mother   . Cancer Father   . Coronary artery disease Brother      Review of Systems - History obtained from the patient General ROS: positive for  - fatigue Psychological ROS: negative Ophthalmic ROS: negative ENT ROS: negative Allergy and Immunology ROS: negative Hematological and Lymphatic ROS: negative Endocrine ROS: negative Respiratory ROS: as in hpi Cardiovascular ROS: no chest pain or dyspnea on exertion Gastrointestinal ROS: no abdominal pain, change in bowel habits, or black or bloody stools Genito-Urinary ROS: no dysuria, trouble voiding, or hematuria Musculoskeletal ROS: negative Neurological ROS: no TIA or stroke symptoms Dermatological ROS: negative  Physical Examination  Filed Vitals:   03/15/12 1546 03/15/12 1617 03/15/12 1717  BP: 132/88    Pulse: 68    Temp: 98.4 F (36.9 C)    TempSrc: Oral    Resp: 24    SpO2: 91% 100% 84%    General appearance: alert, cooperative, appears stated age and no distress Head: Normocephalic, without obvious abnormality, atraumatic Eyes: conjunctivae/corneas clear. PERRL, EOM's intact.  Throat: lips, mucosa, and tongue normal; teeth and gums normal Neck: no adenopathy, no carotid bruit, no JVD, supple, symmetrical, trachea midline and thyroid not enlarged, symmetric, no tenderness/mass/nodules Back: symmetric, no curvature. ROM normal. No CVA tenderness. Resp: Wheezing Is appreciated,  bilaterally. Decreased air entry at the bases. No definite crackles Cardio: regular rate and rhythm, S1, S2 normal, no murmur, click, rub or gallop GI: soft, non-tender; bowel sounds normal; no masses,  no organomegaly Extremities: He has a 1+ pitting edema. Bilateral lower extremities Pulses: 2+ and symmetric Skin: He has extremely dry skin in the lower extremities. Lymph nodes: Cervical, supraclavicular, and axillary nodes normal. Neurologic: He is alert and oriented x3. No focal neurological deficits are present  Laboratory Data: Results for orders placed during the hospital encounter of 03/15/12 (from the past 48 hour(s))  CBC WITH DIFFERENTIAL     Status: Abnormal   Collection Time   03/15/12  2:53 PM      Component Value Range Comment   WBC 5.5  4.0 - 10.5 K/uL    RBC 4.24  4.22 - 5.81 MIL/uL    Hemoglobin 12.4 (*) 13.0 - 17.0 g/dL    HCT 96.0  45.4 - 09.8 %    MCV 94.1  78.0 - 100.0 fL    MCH 29.2  26.0 -  34.0 pg    MCHC 31.1  30.0 - 36.0 g/dL    RDW 62.9  52.8 - 41.3 %    Platelets 216  150 - 400 K/uL    Neutrophils Relative 67  43 - 77 %    Neutro Abs 3.7  1.7 - 7.7 K/uL    Lymphocytes Relative 10 (*) 12 - 46 %    Lymphs Abs 0.5 (*) 0.7 - 4.0 K/uL    Monocytes Relative 22 (*) 3 - 12 %    Monocytes Absolute 1.2 (*) 0.1 - 1.0 K/uL    Eosinophils Relative 1  0 - 5 %    Eosinophils Absolute 0.1  0.0 - 0.7 K/uL    Basophils Relative 0  0 - 1 %    Basophils Absolute 0.0  0.0 - 0.1 K/uL   COMPREHENSIVE METABOLIC PANEL     Status: Abnormal   Collection Time   03/15/12  2:53 PM      Component Value Range Comment   Sodium 139  135 - 145 mEq/L    Potassium 3.7  3.5 - 5.1 mEq/L    Chloride 97  96 - 112 mEq/L    CO2 33 (*) 19 - 32 mEq/L    Glucose, Bld 113 (*) 70 - 99 mg/dL    BUN 25 (*) 6 - 23 mg/dL    Creatinine, Ser 2.44  0.50 - 1.35 mg/dL    Calcium 8.8  8.4 - 01.0 mg/dL    Total Protein 7.7  6.0 - 8.3 g/dL    Albumin 3.5  3.5 - 5.2 g/dL    AST 30  0 - 37 U/L    ALT 15  0  - 53 U/L    Alkaline Phosphatase 86  39 - 117 U/L    Total Bilirubin 0.6  0.3 - 1.2 mg/dL    GFR calc non Af Amer 67 (*) >90 mL/min    GFR calc Af Amer 78 (*) >90 mL/min   TROPONIN I     Status: Normal   Collection Time   03/15/12  2:53 PM      Component Value Range Comment   Troponin I <0.30  <0.30 ng/mL   PRO B NATRIURETIC PEPTIDE     Status: Abnormal   Collection Time   03/15/12  2:53 PM      Component Value Range Comment   Pro B Natriuretic peptide (BNP) 2263.0 (*) 0 - 125 pg/mL   PROTIME-INR     Status: Abnormal   Collection Time   03/15/12  3:13 PM      Component Value Range Comment   Prothrombin Time 15.4 (*) 11.6 - 15.2 seconds    INR 1.24  0.00 - 1.49   D-DIMER, QUANTITATIVE     Status: Normal   Collection Time   03/15/12  4:17 PM      Component Value Range Comment   D-Dimer, Quant 0.42  0.00 - 0.48 ug/mL-FEU   URINALYSIS, ROUTINE W REFLEX MICROSCOPIC     Status: Abnormal   Collection Time   03/15/12  5:20 PM      Component Value Range Comment   Color, Urine YELLOW  YELLOW    APPearance CLEAR  CLEAR    Specific Gravity, Urine >1.030 (*) 1.005 - 1.030    pH 5.5  5.0 - 8.0    Glucose, UA NEGATIVE  NEGATIVE mg/dL    Hgb urine dipstick TRACE (*) NEGATIVE    Bilirubin Urine NEGATIVE  NEGATIVE  Ketones, ur NEGATIVE  NEGATIVE mg/dL    Protein, ur 161 (*) NEGATIVE mg/dL    Urobilinogen, UA 2.0 (*) 0.0 - 1.0 mg/dL    Nitrite NEGATIVE  NEGATIVE    Leukocytes, UA NEGATIVE  NEGATIVE   URINE MICROSCOPIC-ADD ON     Status: Normal   Collection Time   03/15/12  5:20 PM      Component Value Range Comment   Squamous Epithelial / LPF RARE  RARE    RBC / HPF 0-2  <3 RBC/hpf    Bacteria, UA RARE  RARE     Radiology Reports: Dg Chest 2 View  03/15/2012  *RADIOLOGY REPORT*  Clinical Data: Cough and fever, shortness of breath  CHEST - 2 VIEW  Comparison: 12/28/2011  Findings: Colon projects over the upper abdomen.  Mild moderate enlargement of the cardiomediastinal silhouette is stable.   The lungs are clear.  No pleural effusion.  No acute osseous finding.  IMPRESSION: Mild to moderate cardiomegaly, no acute cardiopulmonary process.   Original Report Authenticated By: Christiana Pellant, M.D.     Electrocardiogram: Pending  Problem List  Principal Problem:  *CHRONIC OBSTRUCTIVE PULMONARY DISEASE, ACUTE EXACERBATION Active Problems:  HYPERTENSION  Atrial fibrillation  Long term current use of anticoagulant  Glaucoma   Assessment: This is a 72 year old, African American male, who presents with three-day history of shortness of breath. He has a significant history of tobacco abuse and he quit smoking 4 weeks ago. Chest x-ray does not show any acute findings. He does have some wheezing in the lungs. This is most likely COPD exacerbation. He does have some pedal edema. However, no pulmonary edema is noted. He is afebrile so influenza is unlikely.  Plan: #1 acute COPD exacerbation with hypoxia: He'll be treated with nebulizer treatments, steroids, and antibiotics. Continue with inhaled steroids. I have congratulated him on staying off cigarettes for last 4 weeks. Have encouraged him to continue to do so. He will need to see a pulmonologist as an outpatient. He'll require pulmonary function tests as an outpatient. He was also hypoxic with sats dropping into the 70s with ambulation. Room-air saturations will be checked tomorrow. Patient may require home oxygen.  #2 history of atrial fibrillation: He is on chronic anticoagulation, which will be continued. His INR is subtherapeutic at this time. EKG will be obtained. Echocardiogram from 2012 showed a normal EF. Continue with Lasix for lower extremity edema. Will give him one dose of IV Lasix tonight. Continue with his calcium channel blocker and beta blockers.  #3 history of glaucoma. Continue with the eye drops.  Further management decisions will depend on results of further testing and patient's response to treatment.  Code Status: He  is a full code Family Communication: The above was discussed with the patient. No family at bedside  Disposition Plan: Will likely return home when improved   [facility]  [facility] Pager [phone]  If 7PM-7AM, please contact night-coverage [email or web address] Password [facility]  03/15/2012, 7:27 PM

## 2012-03-15 NOTE — Telephone Encounter (Signed)
States pt has used all of his rescue inhaler he had filled on 12/23. House stays very cold and its affecting his breathing. Has been using spriva, symbicort and ned tid. Wants to see what you recommend. Is going by there soon and will get his sats while she is there.  [phone]

## 2012-03-15 NOTE — ED Notes (Signed)
Shortness of breath since last week. Hx of asthma. States productive cough, white in color. Has used inhalers "too many times today" and has taken two neb treatments to day.

## 2012-03-15 NOTE — Telephone Encounter (Signed)
Alisa aware

## 2012-03-16 DIAGNOSIS — J209 Acute bronchitis, unspecified: Secondary | ICD-10-CM

## 2012-03-16 LAB — COMPREHENSIVE METABOLIC PANEL
AST: 24 U/L (ref 0–37)
Albumin: 3 g/dL — ABNORMAL LOW (ref 3.5–5.2)
BUN: 20 mg/dL (ref 6–23)
Calcium: 8.6 mg/dL (ref 8.4–10.5)
Chloride: 97 mEq/L (ref 96–112)
Creatinine, Ser: 0.94 mg/dL (ref 0.50–1.35)
Total Protein: 6.9 g/dL (ref 6.0–8.3)

## 2012-03-16 LAB — CBC
HCT: 36.2 % — ABNORMAL LOW (ref 39.0–52.0)
Hemoglobin: 11.3 g/dL — ABNORMAL LOW (ref 13.0–17.0)
MCH: 29.4 pg (ref 26.0–34.0)
MCV: 94 fL (ref 78.0–100.0)
Platelets: 207 10*3/uL (ref 150–400)
RBC: 3.85 MIL/uL — ABNORMAL LOW (ref 4.22–5.81)
WBC: 3.3 10*3/uL — ABNORMAL LOW (ref 4.0–10.5)

## 2012-03-16 LAB — TSH: TSH: 0.395 u[IU]/mL (ref 0.350–4.500)

## 2012-03-16 LAB — MAGNESIUM: Magnesium: 1.8 mg/dL (ref 1.5–2.5)

## 2012-03-16 MED ORDER — LEVOFLOXACIN 500 MG PO TABS
500.0000 mg | ORAL_TABLET | Freq: Every day | ORAL | Status: DC
  Administered 2012-03-16 – 2012-03-19 (×4): 500 mg via ORAL
  Filled 2012-03-16 (×5): qty 1

## 2012-03-16 MED ORDER — WARFARIN SODIUM 7.5 MG PO TABS
7.5000 mg | ORAL_TABLET | Freq: Once | ORAL | Status: AC
  Administered 2012-03-16: 7.5 mg via ORAL
  Filled 2012-03-16: qty 1

## 2012-03-16 MED ORDER — PREDNISONE 20 MG PO TABS
40.0000 mg | ORAL_TABLET | Freq: Two times a day (BID) | ORAL | Status: DC
  Administered 2012-03-16 – 2012-03-17 (×3): 40 mg via ORAL
  Filled 2012-03-16 (×3): qty 2

## 2012-03-16 MED ORDER — BENZONATATE 100 MG PO CAPS
100.0000 mg | ORAL_CAPSULE | Freq: Three times a day (TID) | ORAL | Status: DC
  Administered 2012-03-16 – 2012-03-19 (×11): 100 mg via ORAL
  Filled 2012-03-16 (×11): qty 1

## 2012-03-16 NOTE — Progress Notes (Signed)
ANTICOAGULATION CONSULT NOTE - Initial Consult  Pharmacy Consult for Coumadin Indication: atrial fibrillation  No Known Allergies  Patient Measurements: Height: 5\' 6"  (167.6 cm) Weight: 156 lb 11.2 oz (71.079 kg) IBW/kg (Calculated) : 63.8   Vital Signs: Temp: 97.9 F (36.6 C) (01/04 0625) Temp src: Oral (01/03 2047) BP: 112/56 mmHg (01/04 0625) Pulse Rate: 62  (01/04 0625)  Labs:  Basename 03/16/12 0606 03/15/12 1513 03/15/12 1453  HGB 11.3* -- 12.4*  HCT 36.2* -- 39.9  PLT 207 -- 216  APTT -- -- --  LABPROT 15.3* 15.4* --  INR 1.23 1.24 --  HEPARINUNFRC -- -- --  CREATININE 0.94 -- 1.07  CKTOTAL -- -- --  CKMB -- -- --  TROPONINI -- -- <0.30   Estimated Creatinine Clearance: 64.1 ml/min (by C-G formula based on Cr of 0.94).  Medical History: Past Medical History  Diagnosis Date  . Anemia     Hemoglobin 11.6 in 04/2008->resolved   . Edema     Lower Extremity  . Tobacco abuse   . Glaucoma(365)   . Allergic rhinitis   . Atrial fibrillation     Coumadin;asymptomatic bradycardia on telemetry in 12/2009  . CHF (congestive heart failure)     H/o CHF with preserved LV EF; pulmonary hypertension; normal EF in 03/2009  . Hypertension   . Shingles   . Shortness of breath   . COPD (chronic obstructive pulmonary disease)   . Asthma    Medications:  Scheduled:    . [COMPLETED] sodium chloride   Intravenous Once  . [COMPLETED] albuterol  2.5 mg Nebulization Once  . [COMPLETED] albuterol  2.5 mg Nebulization Once  . antiseptic oral rinse  15 mL Mouth Rinse BID  . brimonidine  1 drop Both Eyes BID   And  . timolol  1 drop Both Eyes BID  . budesonide-formoterol  2 puff Inhalation BID  . carvedilol  3.125 mg Oral BID WC  . diltiazem  300 mg Oral Daily  . [COMPLETED] furosemide  40 mg Intravenous Once  . furosemide  40 mg Oral Daily  . guaiFENesin  600 mg Oral BID  . [COMPLETED] ipratropium  0.5 mg Nebulization Once  . [COMPLETED] ipratropium  0.5 mg Nebulization  Once  . levalbuterol  0.63 mg Nebulization QID  . [COMPLETED] levofloxacin (LEVAQUIN) IV  500 mg Intravenous Once  . levofloxacin (LEVAQUIN) IV  500 mg Intravenous Q24H  . [COMPLETED] methylPREDNISolone (SOLU-MEDROL) injection  125 mg Intravenous Once  . methylPREDNISolone (SOLU-MEDROL) injection  60 mg Intravenous Q6H  . potassium chloride  10 mEq Oral QPC breakfast  . sodium chloride  3 mL Intravenous Q12H  . sodium chloride  3 mL Intravenous Q12H  . tiotropium  18 mcg Inhalation Daily  . travoprost (benzalkonium)  1 drop Both Eyes QHS  . [COMPLETED] warfarin  7.5 mg Oral Once  . warfarin  7.5 mg Oral ONCE-1800  . Warfarin - Pharmacist Dosing Inpatient   Does not apply q1800  . [DISCONTINUED] sodium chloride   Intravenous STAT  . [DISCONTINUED] brimonidine-timolol  1 drop Both Eyes Q12H  . [DISCONTINUED] levalbuterol  0.63 mg Nebulization Q4H  . [DISCONTINUED] levofloxacin (LEVAQUIN) IV  500 mg Intravenous Q24H  . [DISCONTINUED] methylPREDNISolone (SOLU-MEDROL) injection  60 mg Intravenous Q6H    Assessment: 71yo male on chronic Coumadin for Afib.  Pt reportedly takes 8mg  on Tue and Fri and 4mg  other days.  INR is sub-therapeutic on admission.  Pt received Coumadin 7.5mg  po last pm. Goal  of Therapy:  INR 2-3 Monitor platelets by anticoagulation protocol: Yes   Plan: Coumadin 7.5mg  today x 1 INR daily until therapeutic and stable.  Valrie Hart A 03/16/2012,8:05 AM

## 2012-03-16 NOTE — Progress Notes (Signed)
Pt ambulating in room and hallway.  O2 sats on room air 95 to 100%.  Dr. Lendell Caprice notified.

## 2012-03-16 NOTE — Progress Notes (Signed)
Pt brady in the 40s on the monitor with pauses of 1.88 sec.  Pt asymptomatic.  Resting in bed.  Dr. Lendell Caprice at nurse's station and notified.  She will go see the patient.  Gave orders to hold the Coreg and Diltiazem.  Orders followed.

## 2012-03-16 NOTE — Plan of Care (Signed)
Problem: Phase I Progression Outcomes Goal: OOB as tolerated unless otherwise ordered Outcome: Completed/Met Date Met:  03/16/12 Pt ambulating in room and hallway without difficulty.

## 2012-03-16 NOTE — Progress Notes (Addendum)
Pt bradycardic in the 40s.  Pt resting in bed.  Asymptomatic.  Dr. Lendell Caprice on floor and notified.  Will continue to monitor.

## 2012-03-16 NOTE — Progress Notes (Signed)
Chart reviewed. Per nursing staff, has frequent periods of pauses up to about 2-1/2 seconds. Also periods of bradycardia.  Subjective: Reports he can feel when his heart rate slows, because he becomes dizzy. Requesting something for cough. Breathing a little easier. Wants to walk around.  Objective: Vital signs in last 24 hours: Filed Vitals:   03/15/12 2051 03/16/12 0031 03/16/12 0625 03/16/12 0701  BP:   112/56   Pulse:   62   Temp:   97.9 F (36.6 C)   TempSrc:      Resp:   20   Height:      Weight:      SpO2: 96% 96% 96% 96%   Weight change:   Intake/Output Summary (Last 24 hours) at 03/16/12 1042 Last data filed at 03/16/12 0654  Gross per 24 hour  Intake    240 ml  Output    350 ml  Net   -110 ml   Telemetry: Atrial fibrillation  General: Occasional cough. Breathing nonlabored. Standing up at his bedside with oxygen in place. Alert and oriented Lungs: Coarse. Good air movement. No wheeze. Cardiovascular: Irregularly irregular Abdomen soft nontender Extremities no clubbing cyanosis or edema  Lab Results: Basic Metabolic Panel:  Lab 03/16/12 1610 03/15/12 1453  NA 138 139  K 4.0 3.7  CL 97 97  CO2 34* 33*  GLUCOSE 147* 113*  BUN 20 25*  CREATININE 0.94 1.07  CALCIUM 8.6 8.8  MG 1.8 --  PHOS -- --   Liver Function Tests:  Lab 03/16/12 0606 03/15/12 1453  AST 24 30  ALT 12 15  ALKPHOS 74 86  BILITOT 0.4 0.6  PROT 6.9 7.7  ALBUMIN 3.0* 3.5   No results found for this basename: LIPASE:2,AMYLASE:2 in the last 168 hours No results found for this basename: AMMONIA:2 in the last 168 hours CBC:  Lab 03/16/12 0606 03/15/12 1453  WBC 3.3* 5.5  NEUTROABS -- 3.7  HGB 11.3* 12.4*  HCT 36.2* 39.9  MCV 94.0 94.1  PLT 207 216   Cardiac Enzymes:  Lab 03/15/12 1453  CKTOTAL --  CKMB --  CKMBINDEX --  TROPONINI <0.30   BNP:  Lab 03/15/12 1453  PROBNP 2263.0*   D-Dimer:  Lab 03/15/12 1617  DDIMER 0.42   CBG: No results found for this  basename: GLUCAP:6 in the last 168 hours Hemoglobin A1C: No results found for this basename: HGBA1C in the last 168 hours Fasting Lipid Panel: No results found for this basename: CHOL,HDL,LDLCALC,TRIG,CHOLHDL,LDLDIRECT in the last 960 hours Thyroid Function Tests: No results found for this basename: TSH,T4TOTAL,FREET4,T3FREE,THYROIDAB in the last 168 hours Coagulation:  Lab 03/16/12 0606 03/15/12 1513  LABPROT 15.3* 15.4*  INR 1.23 1.24   Anemia Panel: No results found for this basename: VITAMINB12,FOLATE,FERRITIN,TIBC,IRON,RETICCTPCT in the last 168 hours Urine Drug Screen: Drugs of Abuse  No results found for this basename: labopia, cocainscrnur, labbenz, amphetmu, thcu, labbarb    Alcohol Level: No results found for this basename: ETH:2 in the last 168 hours Urinalysis:  Lab 03/15/12 1720  COLORURINE YELLOW  LABSPEC >1.030*  PHURINE 5.5  GLUCOSEU NEGATIVE  HGBUR TRACE*  BILIRUBINUR NEGATIVE  KETONESUR NEGATIVE  PROTEINUR 100*  UROBILINOGEN 2.0*  NITRITE NEGATIVE  LEUKOCYTESUR NEGATIVE   Micro Results: No results found for this or any previous visit (from the past 240 hour(s)). Studies/Results: Dg Chest 2 View  03/15/2012  *RADIOLOGY REPORT*  Clinical Data: Cough and fever, shortness of breath  CHEST - 2 VIEW  Comparison: 12/28/2011  Findings:  Colon projects over the upper abdomen.  Mild moderate enlargement of the cardiomediastinal silhouette is stable.  The lungs are clear.  No pleural effusion.  No acute osseous finding.  IMPRESSION: Mild to moderate cardiomegaly, no acute cardiopulmonary process.   Original Report Authenticated By: Christiana Pellant, M.D.    Scheduled Meds:   . antiseptic oral rinse  15 mL Mouth Rinse BID  . brimonidine  1 drop Both Eyes BID   And  . timolol  1 drop Both Eyes BID  . budesonide-formoterol  2 puff Inhalation BID  . furosemide  40 mg Oral Daily  . guaiFENesin  600 mg Oral BID  . levalbuterol  0.63 mg Nebulization QID  . levofloxacin  (LEVAQUIN) IV  500 mg Intravenous Q24H  . methylPREDNISolone (SOLU-MEDROL) injection  60 mg Intravenous Q6H  . potassium chloride  10 mEq Oral QPC breakfast  . sodium chloride  3 mL Intravenous Q12H  . sodium chloride  3 mL Intravenous Q12H  . tiotropium  18 mcg Inhalation Daily  . travoprost (benzalkonium)  1 drop Both Eyes QHS  . warfarin  7.5 mg Oral ONCE-1800  . Warfarin - Pharmacist Dosing Inpatient   Does not apply q1800   Continuous Infusions:  PRN Meds:.sodium chloride, acetaminophen, acetaminophen, guaiFENesin-dextromethorphan, levalbuterol, ondansetron (ZOFRAN) IV, ondansetron, oxyCODONE, sodium chloride Assessment/Plan: Principal Problem:  *CHRONIC OBSTRUCTIVE PULMONARY DISEASE, ACUTE EXACERBATION Active Problems:  Acute bronchitis  HYPERTENSION  Atrial fibrillation with periods of bradycardia and pauses  Long term current use of anticoagulant  Glaucoma  Hypoxia  Will check a room air pulse ox. Hold diltiazem and Coreg for now. Monitor on telemetry. Change steroids to by mouth. Continue bronchodilators. Increase activity as able. Continue antibiotics. Add Lawyer. Check TSH   LOS: 1 day   Gemini Beaumier L 03/16/2012, 10:42 AM

## 2012-03-16 NOTE — Progress Notes (Signed)
Patient had 5 pauses within an hour and a half. The longest being 2.24 seconds. Patient was sleeping and asymptomatic. Doctor was notified and new orders were given.

## 2012-03-17 LAB — TROPONIN I: Troponin I: <0.3 ng/mL (ref <0.30)

## 2012-03-17 MED ORDER — WARFARIN SODIUM 7.5 MG PO TABS
7.5000 mg | ORAL_TABLET | Freq: Once | ORAL | Status: AC
  Administered 2012-03-17: 7.5 mg via ORAL
  Filled 2012-03-17: qty 1

## 2012-03-17 MED ORDER — PREDNISONE 20 MG PO TABS
30.0000 mg | ORAL_TABLET | Freq: Every day | ORAL | Status: DC
  Administered 2012-03-18 – 2012-03-19 (×2): 30 mg via ORAL
  Filled 2012-03-17 (×2): qty 1

## 2012-03-17 NOTE — Progress Notes (Signed)
Subjective: Still with cough, wheezing.  Feels dizzy sometimes. Able to ambulate without difficulty.  Objective: Vital signs in last 24 hours: Filed Vitals:   03/16/12 2026 03/16/12 2129 03/17/12 0546 03/17/12 0652  BP:  133/72 125/79   Pulse:  55 65   Temp:  97.8 F (36.6 C) 97.8 F (36.6 C)   TempSrc:  Oral Oral   Resp:  20 20   Height:      Weight:      SpO2: 90% 93% 93% 95%   Weight change:   Intake/Output Summary (Last 24 hours) at 03/17/12 1046 Last data filed at 03/16/12 2100  Gross per 24 hour  Intake    960 ml  Output    500 ml  Net    460 ml   Telemetry: Atrial fibrillation. Had a 2-1/2 second pause this morning.  General: Occasional cough. Breathing nonlabored. Standing up watching TV without oxygen  Lungs: Clear to auscultation bilaterally without wheezes rhonchi or rales. Cardiovascular: Irregularly irregular Abdomen soft nontender Extremities no clubbing cyanosis or edema  Lab Results: Basic Metabolic Panel:  Lab 03/16/12 2440 03/15/12 1453  NA 138 139  K 4.0 3.7  CL 97 97  CO2 34* 33*  GLUCOSE 147* 113*  BUN 20 25*  CREATININE 0.94 1.07  CALCIUM 8.6 8.8  MG 1.8 --  PHOS -- --   Liver Function Tests:  Lab 03/16/12 0606 03/15/12 1453  AST 24 30  ALT 12 15  ALKPHOS 74 86  BILITOT 0.4 0.6  PROT 6.9 7.7  ALBUMIN 3.0* 3.5   No results found for this basename: LIPASE:2,AMYLASE:2 in the last 168 hours No results found for this basename: AMMONIA:2 in the last 168 hours CBC:  Lab 03/16/12 0606 03/15/12 1453  WBC 3.3* 5.5  NEUTROABS -- 3.7  HGB 11.3* 12.4*  HCT 36.2* 39.9  MCV 94.0 94.1  PLT 207 216   Cardiac Enzymes:  Lab 03/15/12 1453  CKTOTAL --  CKMB --  CKMBINDEX --  TROPONINI <0.30   BNP:  Lab 03/15/12 1453  PROBNP 2263.0*   D-Dimer:  Lab 03/15/12 1617  DDIMER 0.42   CBG: No results found for this basename: GLUCAP:6 in the last 168 hours Hemoglobin A1C: No results found for this basename: HGBA1C in the last 168  hours Fasting Lipid Panel: No results found for this basename: CHOL,HDL,LDLCALC,TRIG,CHOLHDL,LDLDIRECT in the last 102 hours Thyroid Function Tests:  Lab 03/16/12 1048  TSH 0.395  T4TOTAL --  FREET4 --  T3FREE --  THYROIDAB --   Coagulation:  Lab 03/17/12 0500 03/16/12 0606 03/15/12 1513  LABPROT 20.2* 15.3* 15.4*  INR 1.79* 1.23 1.24   Anemia Panel: No results found for this basename: VITAMINB12,FOLATE,FERRITIN,TIBC,IRON,RETICCTPCT in the last 168 hours Urine Drug Screen: Drugs of Abuse  No results found for this basename: labopia,  cocainscrnur,  labbenz,  amphetmu,  thcu,  labbarb    Alcohol Level: No results found for this basename: ETH:2 in the last 168 hours Urinalysis:  Lab 03/15/12 1720  COLORURINE YELLOW  LABSPEC >1.030*  PHURINE 5.5  GLUCOSEU NEGATIVE  HGBUR TRACE*  BILIRUBINUR NEGATIVE  KETONESUR NEGATIVE  PROTEINUR 100*  UROBILINOGEN 2.0*  NITRITE NEGATIVE  LEUKOCYTESUR NEGATIVE   Micro Results: No results found for this or any previous visit (from the past 240 hour(s)). Studies/Results: Dg Chest 2 View  03/15/2012  *RADIOLOGY REPORT*  Clinical Data: Cough and fever, shortness of breath  CHEST - 2 VIEW  Comparison: 12/28/2011  Findings: Colon projects over the  upper abdomen.  Mild moderate enlargement of the cardiomediastinal silhouette is stable.  The lungs are clear.  No pleural effusion.  No acute osseous finding.  IMPRESSION: Mild to moderate cardiomegaly, no acute cardiopulmonary process.   Original Report Authenticated By: Christiana Pellant, M.D.    Scheduled Meds:    . antiseptic oral rinse  15 mL Mouth Rinse BID  . benzonatate  100 mg Oral TID  . brimonidine  1 drop Both Eyes BID   And  . timolol  1 drop Both Eyes BID  . budesonide-formoterol  2 puff Inhalation BID  . furosemide  40 mg Oral Daily  . levalbuterol  0.63 mg Nebulization QID  . levofloxacin  500 mg Oral Daily  . potassium chloride  10 mEq Oral QPC breakfast  . predniSONE  40 mg  Oral BID  . sodium chloride  3 mL Intravenous Q12H  . sodium chloride  3 mL Intravenous Q12H  . tiotropium  18 mcg Inhalation Daily  . travoprost (benzalkonium)  1 drop Both Eyes QHS  . Warfarin - Pharmacist Dosing Inpatient   Does not apply q1800   Continuous Infusions:  PRN Meds:.sodium chloride, acetaminophen, acetaminophen, guaiFENesin-dextromethorphan, levalbuterol, ondansetron (ZOFRAN) IV, ondansetron, oxyCODONE, sodium chloride Assessment/Plan: Principal Problem:  *CHRONIC OBSTRUCTIVE PULMONARY DISEASE, ACUTE EXACERBATION Active Problems:  Acute bronchitis  HYPERTENSION fairly well controlled off Cardizem and Coreg.  Atrial fibrillation with periods of bradycardia and pauses: His coreg and Cardizem have been held.  Long term current use of anticoagulant: Coumadin per pharmacy.  Glaucoma  Hypoxia  COPD much improved Will taper prednisone quickly. Continue antibiotics and bronchodilators per Will consult cardiology for recommendations regarding periods of bradycardia and pauses off Cardizem and Coreg.    LOS: 2 days   Marquetta Weiskopf L 03/17/2012, 10:46 AM

## 2012-03-17 NOTE — Progress Notes (Signed)
ANTICOAGULATION CONSULT NOTE - Initial Consult  Pharmacy Consult for Coumadin Indication: atrial fibrillation  No Known Allergies  Patient Measurements: Height: 5\' 6"  (167.6 cm) Weight: 156 lb 11.2 oz (71.079 kg) IBW/kg (Calculated) : 63.8   Vital Signs: Temp: 97.8 F (36.6 C) (01/05 0546) Temp src: Oral (01/05 0546) BP: 125/79 mmHg (01/05 0546) Pulse Rate: 65  (01/05 0546)  Labs:  Basename 03/17/12 0500 03/16/12 0606 03/15/12 1513 03/15/12 1453  HGB -- 11.3* -- 12.4*  HCT -- 36.2* -- 39.9  PLT -- 207 -- 216  APTT -- -- -- --  LABPROT 20.2* 15.3* 15.4* --  INR 1.79* 1.23 1.24 --  HEPARINUNFRC -- -- -- --  CREATININE -- 0.94 -- 1.07  CKTOTAL -- -- -- --  CKMB -- -- -- --  TROPONINI -- -- -- <0.30   Estimated Creatinine Clearance: 64.1 ml/min (by C-G formula based on Cr of 0.94).  Medical History: Past Medical History  Diagnosis Date  . Anemia     Hemoglobin 11.6 in 04/2008->resolved   . Edema     Lower Extremity  . Tobacco abuse   . Glaucoma(365)   . Allergic rhinitis   . Atrial fibrillation     Coumadin;asymptomatic bradycardia on telemetry in 12/2009  . CHF (congestive heart failure)     H/o CHF with preserved LV EF; pulmonary hypertension; normal EF in 03/2009  . Hypertension   . Shingles   . Shortness of breath   . COPD (chronic obstructive pulmonary disease)   . Asthma    Medications:  Scheduled:     . antiseptic oral rinse  15 mL Mouth Rinse BID  . benzonatate  100 mg Oral TID  . brimonidine  1 drop Both Eyes BID   And  . timolol  1 drop Both Eyes BID  . budesonide-formoterol  2 puff Inhalation BID  . furosemide  40 mg Oral Daily  . levalbuterol  0.63 mg Nebulization QID  . levofloxacin  500 mg Oral Daily  . potassium chloride  10 mEq Oral QPC breakfast  . predniSONE  30 mg Oral Q breakfast  . sodium chloride  3 mL Intravenous Q12H  . sodium chloride  3 mL Intravenous Q12H  . tiotropium  18 mcg Inhalation Daily  . travoprost (benzalkonium)   1 drop Both Eyes QHS  . [COMPLETED] warfarin  7.5 mg Oral ONCE-1800  . Warfarin - Pharmacist Dosing Inpatient   Does not apply q1800  . [DISCONTINUED] predniSONE  40 mg Oral BID    Assessment: 71yo male on chronic Coumadin for Afib.  Pt reportedly takes 8mg  on Tue and Fri and 4mg  other days.  INR is sub-therapeutic on admission.  Pt received Coumadin 7.5mg  po last pm.  INR is now rising toward goal.   Goal of Therapy:  INR 2-3 Monitor platelets by anticoagulation protocol: Yes   Plan: Coumadin 7.5mg  today x 1 INR daily until therapeutic and stable.  Margo Aye, Seann Genther A 03/17/2012,11:41 AM

## 2012-03-17 NOTE — Progress Notes (Signed)
Pt had two second pause on monitor.  Heart rate on 50s.  Pt asymptomatic.  Dr. Lendell Caprice on floor and notified.  No new orders.  Will continue to monitor.

## 2012-03-17 NOTE — Progress Notes (Signed)
Pt's stat EKG results back.  Dr. Lendell Caprice paged.

## 2012-03-17 NOTE — Progress Notes (Signed)
Pt c/o new onset Chest Pain. Describes as sharp and that is started on the L upper chest and moves across to the upper middle chest.  VS obtained-stable, MD notified, orders given.  RT notified to do EKG.

## 2012-03-18 DIAGNOSIS — I059 Rheumatic mitral valve disease, unspecified: Secondary | ICD-10-CM

## 2012-03-18 DIAGNOSIS — J441 Chronic obstructive pulmonary disease with (acute) exacerbation: Secondary | ICD-10-CM

## 2012-03-18 DIAGNOSIS — I10 Essential (primary) hypertension: Secondary | ICD-10-CM

## 2012-03-18 DIAGNOSIS — R001 Bradycardia, unspecified: Secondary | ICD-10-CM

## 2012-03-18 DIAGNOSIS — I4891 Unspecified atrial fibrillation: Secondary | ICD-10-CM

## 2012-03-18 DIAGNOSIS — I498 Other specified cardiac arrhythmias: Secondary | ICD-10-CM

## 2012-03-18 LAB — PROTIME-INR: INR: 2.25 — ABNORMAL HIGH (ref 0.00–1.49)

## 2012-03-18 MED ORDER — WARFARIN SODIUM 5 MG PO TABS
5.0000 mg | ORAL_TABLET | Freq: Every day | ORAL | Status: DC
  Administered 2012-03-18: 5 mg via ORAL
  Filled 2012-03-18: qty 1

## 2012-03-18 MED ORDER — LISINOPRIL 5 MG PO TABS
5.0000 mg | ORAL_TABLET | Freq: Every day | ORAL | Status: DC
  Administered 2012-03-18 – 2012-03-19 (×2): 5 mg via ORAL
  Filled 2012-03-18 (×2): qty 1

## 2012-03-18 NOTE — Progress Notes (Signed)
Followed closely in community by Marja Kays MHA,BSN,RN,CCM and Lorna Few, LCSW - [facility] Care Management. Following closely with weekly contact/visits. Please call [phone] for questions or assistance.

## 2012-03-18 NOTE — Progress Notes (Signed)
*  PRELIMINARY RESULTS* Echocardiogram 2D Echocardiogram has been performed.  Nestor Ramp M 03/18/2012, 7:29 PM

## 2012-03-18 NOTE — Progress Notes (Signed)
Patient is currently active with [facility] Care Management for chronic disease management services.  Patient has been engaged by a [employer] and LCSW.  Our community based plan of care has focused on disease management of COPD, medication procurement/adherence, and community resource support.  Lives at home with wife.  He and his wife are having significant financial difficulty.  As a result heating his home has become a challenge.  He is using kerosene heaters and the oven in his kitchen for room specific heating.  Cold air and kerosene fumes in the home may be contributing to his COPD exacerbations.  LCSW has assisted with a Medicaid application.  They were denied due a lack of documentation.  Please be advised that the patient is functionally illiterate.  He is not forth coming about information regarding his living conditions, finances, or his literacy.  This information has been obtained by our community based team slowly over time.  We are available to assist with his inpatient care as needed if it is felt that our established rapport would be beneficial.  Patient will receive a post discharge transition of care call and will be evaluated for monthly home visits, assessments, and additional disease process education.  Inpatient Care Manager made aware that [facility] Care Management following.  Of note, [facility] Care Management services does not replace or interfere with any services that are arranged by inpatient case management or social work.  For additional questions or referrals please contact Anibal Henderson BSN RN [facility] Liaison at [phone].

## 2012-03-18 NOTE — Consult Note (Signed)
CARDIOLOGY CONSULT NOTE     Patient ID: Theodore [name] MRN: [id number] DOB/AGE: 73/14/1942 73 y.o.  Admit date: 03/15/2012 Referring Physician: PTH-Sullivan Primary Physician: Theodore Overman, MD Primary Cardiologist: Theodore [name] Reason for Consultation: Bradycardia with pauses, with history of atrial fibrillation.   HPI: Theodore [name] is a 72 y/o patient followed by Theodore [name] for atrial fibrillation on chronic coumadin being followed in our [place] clinic, hypertension and diastolic CHF with pulmonary hypertension.Marland Kitchen He was admitted for cough, shortness of breath and congestion with COPD exacerbation. He was found to have periods of bradycardia with pauses with ongoing atrial fibrillation. He was taken off both carvedilol and diltiazem by Theodore [name]. We are asked for cardiac recommendations.  He is followed as an OP by Theodore [name] and has PT-INR completed by them with results sent to our office. He has not been seen in our office since 2012. He states that he does not have transportation. He is not complaining of near syncope, but has occasional dizziness, and transient sharp pain in his chest at times. He states that his breathing better, coughing less and denies fever or chills.   Other history includes tobacco abuse, COPD, and anemia. Review of labs demonstrate Pro-BNP 2263 on admission. Troponin is negative X 4. Hgb 11.3 and HCT 36.2  Creatinine 0.94. Blood pressure on admission 132/88. BP since taking off of carvedilol and diltiazem 151/86.   Review of systems complete and found to be negative unless listed above   Past Medical History  Diagnosis Date  . Anemia     Hemoglobin 11.6 in 04/2008->resolved   . Edema     Lower Extremity  . Tobacco abuse   . Glaucoma(365)   . Allergic rhinitis   . Atrial fibrillation     Coumadin;asymptomatic bradycardia on telemetry in 12/2009  . CHF (congestive heart failure)     H/o CHF with preserved LV EF; pulmonary hypertension; normal EF in  03/2009  . Hypertension   . Shingles   . Shortness of breath   . COPD (chronic obstructive pulmonary disease)   . Asthma     Family History  Problem Relation Age of Onset  . Cancer Mother   . Cancer Father   . Coronary artery disease Brother     History   Social History  . Marital Status: Married    Spouse Name: N/A    Number of Children: 2  . Years of Education: N/A   Occupational History  . 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     Social History Main Topics  . Smoking status: Former Smoker -- 0.5 packs/day for 55 years    Types: Cigarettes    Quit date: 02/13/2012  . Smokeless tobacco: Never Used  . Alcohol Use: No  . Drug Use: No  . Sexually Active: No   Other Topics Concern  . Not on file   Social History Narrative  . No narrative on file    Past Surgical History  Procedure Date  . Bilateral cataract surgery   . Herniorrhapy   . Tendon repair     right hand surgical procedure for a tendon repair    Current medications:    . antiseptic oral rinse  15 mL Mouth Rinse BID  . benzonatate  100 mg Oral TID  . brimonidine  1 drop Both Eyes BID   And  . timolol  1 drop  Both Eyes BID  . budesonide-formoterol  2 puff Inhalation BID  . furosemide  40 mg Oral Daily  . levalbuterol  0.63 mg Nebulization QID  . levofloxacin  500 mg Oral Daily  . lisinopril  5 mg Oral Daily  . potassium chloride  10 mEq Oral QPC breakfast  . predniSONE  30 mg Oral Q breakfast  . sodium chloride  3 mL Intravenous Q12H  . sodium chloride  3 mL Intravenous Q12H  . tiotropium  18 mcg Inhalation Daily  . travoprost (benzalkonium)  1 drop Both Eyes QHS  . Warfarin - Pharmacist Dosing Inpatient   Does not apply q1800    Physical Exam: Blood pressure 151/86, pulse 97, temperature 97.5 F (36.4 C), temperature source Oral, resp. rate 20, height 5\' 6"  (1.676 m), weight 156 lb 11.2 oz (71.079 kg), SpO2 96.00%.     General: Well developed, well nourished, in no acute distress Head: Eyes PERRLA, No xanthomas.   Normal cephalic and atramatic  Lungs: Soft bilataral crackles in the lower lobes, without wheezes or coughing with inspiration. Heart: HRIR S1 S2, soft systolic murmur.  Pulses are 2+ & equal.            No carotid bruit. No JVD. Abdomen: Bowel sounds are positive, abdomen soft and non-tender without masses or                  Hernia's noted. Msk:  Back normal, normal gait. Normal strength and tone for age. Extremities: No clubbing, cyanosis, 1+ edema in the right leg, but not in the left..  DP +1 Neuro: Alert and oriented X 3. Psych:  Good affect, responds appropriately   Labs:   Lab Results  Component Value Date   WBC 3.3* 03/16/2012   HGB 11.3* 03/16/2012   HCT 36.2* 03/16/2012   MCV 94.0 03/16/2012   PLT 207 03/16/2012     Lab 03/16/12 0606  NA 138  K 4.0  CL 97  CO2 34*  BUN 20  CREATININE 0.94  CALCIUM 8.6  PROT 6.9  BILITOT 0.4  ALKPHOS 74  ALT 12  AST 24  GLUCOSE 147*   Lab Results  Component Value Date   CKTOTAL 133 03/19/2011   CKMB 6.4* 03/19/2011   TROPONINI <0.30 03/18/2012     Echocardiogram 2011   Left ventricle: Mild tomoderate LVH with disporoportionate septal hypertrophy. Systolic function was normal. The estimated ejection fraction was 55%. Wall motion was normal; there were no regional wall motion abnormalities. - Aortic valve: Mildly to moderately calcified annulus. Mildly thickened, mildly calcified leaflets. - Mitral valve: Very mild regurgitation. - Left atrium: The atrium was moderately dilated. - Right atrium: The atrium was moderately dilated. - Atrial septum: No defect or patent foramen ovale was identified. - Pulmonary arteries: Systolic pressure was moderately increased. PA peak pressure: 60mm Hg (S).  EKG: Atrial fibrillation LAFB versus inferior infarct (cited on or before 18-Mar-2011)  Telemetry (Last 24 hours) Atrial fib with rates  between 58-80 bpm. 2 sec pause noted at 7:21 this am. (asymptomatic)  ASSESSMENT AND PLAN:   1. Atrial fibrillation: Slow ventricular response over the last 3 days since admission with asymptomatic pauses on telemetry with longest measured at 2.24 sec. He does admit to having some dizziness episodes at home, but attributes this to "rushing around", does not seem to be a sporadic or unpredictable problem to suggest definite correlation with bradycardia or pauses. He has in fact been assessed in the past for bradycardia  as of 2011 has been maintained on beta blocker and calcium channel blocker for quite some time.Marland Kitchen He has been off all rate control medications for 24 hours and heart rate is relatively stable, although trend is increasing. He may ultimately require resumption of low-dose calcium channel blocker. No clear indication for pacemaker at this time.  2. Hypertension: He has been on ACE inhibitor in the past, by review of records Lisinopril (as high as 40 mg) and tolerated this. Will place on low dose, 5 mg daily for BP control and monitor response. Creatinine 0.94 this am. Check BMET in am.  3.  Diastolic Dysfunction with mild CHF: Pro-BNP was elevated on admission with no evidence of CHF per CXR. He has history of Cor Pulmonale.  Elevation probably related to myocardial strain in the setting of COPD. Repeat echo will be ordered. Continue on lasix PO.   Signed: Bettey Mare. Lyman Bishop NP [facility] 03/18/2012, 10:26 AM Co-Sign MD   Attending note:  Patient seen and examined. Records reviewed. Above note per Ms. Lawrence NP was modified by me as well. Patient being treated for COPD exacerbation, noted to be bradycardic with asymptomatic pauses on telemetry on combination of beta blocker and calcium channel blocker, which he has been on for some time. Also elevation in proBNP without edema by chest x-ray, previous LV function normal, but does have history of cor pulmonale. He is on by mouth  Lasix, heart rate trend increasing off Coreg and diltiazem.  On examination he is comfortable, lungs with diminished breath sounds and scattered rhonchi/crackles at the bases, no wheezing. Cardiac exam with irregularly irregular rhythm and soft systolic murmur, trace pitting edema.  Lab work reviewed finding hemoglobin 11.3, INR 2.2, TSH 0.39, potassium 3.7, BUN 25, creatinine 1.0, pro BNP 2263. 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  Atrial fibrillation with bradycardia and asymptomatic pauses, likely in the setting of conduction system disease and treatment with beta blocker and calcium channel blocker. Heart rate trend is increasing off medication. Would observe for now, however he may need to ultimately resume lower dose diltiazem to prevent rapid ventricular response. Echocardiogram also pending for reassessment of LV function. No clear indication for pacemaker.  Jonelle Sidle, M.D., F.A.C.C.

## 2012-03-18 NOTE — Progress Notes (Signed)
Utilization Review Complete  

## 2012-03-18 NOTE — Progress Notes (Signed)
[facility]             Progress Note   Subjective: Patient says he still feels short of breath. He is seen ambulating around his room.  He denies any chest pain or palpitations  Objective: Vital signs in last 24 hours: Temp:  [97.5 F (36.4 C)] 97.5 F (36.4 C) (01/06 1500) Pulse Rate:  [81-117] 90  (01/06 1500) Resp:  [20] 20  (01/06 1500) BP: (119-151)/(63-93) 119/63 mmHg (01/06 1500) SpO2:  [93 %-100 %] 93 % (01/06 1515) Weight change:  Last BM Date: 03/15/12  Intake/Output from previous day: 01/05 0701 - 01/06 0700 In: 1323 [P.O.:1320; I.V.:3] Out: 1050 [Urine:1050] Total I/O In: -  Out: 525 [Urine:525]   Physical Exam: General: Alert, awake, oriented x3, in no acute distress. HEENT: No bruits, no goiter. Heart: Regular rate and rhythm, without murmurs, rubs, gallops. Lungs: rhonchi at bases, no significant wheezing appreciated. Abdomen: Soft, nontender, nondistended, positive bowel sounds. Extremities: No clubbing cyanosis or edema with positive pedal pulses. Neuro: Grossly intact, nonfocal.    Lab Results: Basic Metabolic Panel:  Basename 03/16/12 0606  NA 138  K 4.0  CL 97  CO2 34*  GLUCOSE 147*  BUN 20  CREATININE 0.94  CALCIUM 8.6  MG 1.8  PHOS --   Liver Function Tests:  Basename 03/16/12 0606  AST 24  ALT 12  ALKPHOS 74  BILITOT 0.4  PROT 6.9  ALBUMIN 3.0*   No results found for this basename: LIPASE:2,AMYLASE:2 in the last 72 hours No results found for this basename: AMMONIA:2 in the last 72 hours CBC:  Basename 03/16/12 0606  WBC 3.3*  NEUTROABS --  HGB 11.3*  HCT 36.2*  MCV 94.0  PLT 207   Cardiac Enzymes:  Basename 03/18/12 0317 03/17/12 2140 03/17/12 1507  CKTOTAL -- -- --  CKMB -- -- --  CKMBINDEX -- -- --  TROPONINI <0.30 <0.30 <0.30   BNP: No results found for this basename: PROBNP:3 in the last 72 hours D-Dimer:  [facility] 03/15/12 1617  DDIMER 0.42   CBG: No results found for this basename:  GLUCAP:6 in the last 72 hours Hemoglobin A1C: No results found for this basename: HGBA1C in the last 72 hours Fasting Lipid Panel: No results found for this basename: CHOL,HDL,LDLCALC,TRIG,CHOLHDL,LDLDIRECT in the last 72 hours Thyroid Function Tests:  Basename 03/16/12 1048  TSH 0.395  T4TOTAL --  FREET4 --  T3FREE --  THYROIDAB --   Anemia Panel: No results found for this basename: VITAMINB12,FOLATE,FERRITIN,TIBC,IRON,RETICCTPCT in the last 72 hours Coagulation:  Basename 03/18/12 0317 03/17/12 0500  LABPROT 23.9* 20.2*  INR 2.25* 1.79*   Urine Drug Screen: Drugs of Abuse  No results found for this basename: labopia, cocainscrnur, labbenz, amphetmu, thcu, labbarb    Alcohol Level: No results found for this basename: ETH:2 in the last 72 hours Urinalysis:  Basename 03/15/12 1720  COLORURINE YELLOW  LABSPEC >1.030*  PHURINE 5.5  GLUCOSEU NEGATIVE  HGBUR TRACE*  BILIRUBINUR NEGATIVE  KETONESUR NEGATIVE  PROTEINUR 100*  UROBILINOGEN 2.0*  NITRITE NEGATIVE  LEUKOCYTESUR NEGATIVE    No results found for this or any previous visit (from the past 240 hour(s)).  Studies/Results: No results found.  Medications: Scheduled Meds:   . antiseptic oral rinse  15 mL Mouth Rinse BID  . benzonatate  100 mg Oral TID  . brimonidine  1 drop Both Eyes BID   And  . timolol  1 drop Both Eyes BID  . budesonide-formoterol  2 puff  Inhalation BID  . furosemide  40 mg Oral Daily  . levalbuterol  0.63 mg Nebulization QID  . levofloxacin  500 mg Oral Daily  . lisinopril  5 mg Oral Daily  . potassium chloride  10 mEq Oral QPC breakfast  . predniSONE  30 mg Oral Q breakfast  . sodium chloride  3 mL Intravenous Q12H  . sodium chloride  3 mL Intravenous Q12H  . tiotropium  18 mcg Inhalation Daily  . travoprost (benzalkonium)  1 drop Both Eyes QHS  . warfarin  5 mg Oral q1800  . Warfarin - Pharmacist Dosing Inpatient   Does not apply q1800   Continuous Infusions:  PRN  Meds:.sodium chloride, acetaminophen, acetaminophen, guaiFENesin-dextromethorphan, levalbuterol, ondansetron (ZOFRAN) IV, ondansetron, oxyCODONE, sodium chloride  Assessment/Plan:  Principal Problem:  *CHRONIC OBSTRUCTIVE PULMONARY DISEASE, ACUTE EXACERBATION Active Problems:  HYPERTENSION  Atrial fibrillation  Long term current use of anticoagulant  Glaucoma  Hypoxia  Acute bronchitis  Bradycardia  Plan:  1. COPD exacerbation.  Continue bronchodilators and prednisone taper.  I suspect he is approaching his baseline.  2. Atrial fibrillation with asymptomatic bradycardia.  Rate control meds have been held.  Cardiology following, appreciate assistance. Heart rate is improving.  Echo ordered and is pending.  Will likely need to restart diltiazem on discharge. Coumadin per pharmacy.  3. HTN.  Controlled.  4. Dispo.  From a respiratory standpoint, the patient will likely be ready for discharge by tomorrow.  Will await further work up/recommendations per cardiology.  Time spent coordinating care:   LOS: 3 days   MEMON,JEHANZEB [facility] Pager: [phone] 03/18/2012, 3:31 PM

## 2012-03-18 NOTE — Progress Notes (Signed)
ANTICOAGULATION CONSULT NOTE   Pharmacy Consult for Coumadin Indication: atrial fibrillation  No Known Allergies  Patient Measurements: Height: 5\' 6"  (167.6 cm) Weight: 156 lb 11.2 oz (71.079 kg) IBW/kg (Calculated) : 63.8   Vital Signs: BP: 151/86 mmHg (01/06 0502) Pulse Rate: 97  (01/06 0502)  Labs:  Basename 03/18/12 0317 03/17/12 2140 03/17/12 1507 03/17/12 0500 03/16/12 0606 03/15/12 1453  HGB -- -- -- -- 11.3* 12.4*  HCT -- -- -- -- 36.2* 39.9  PLT -- -- -- -- 207 216  APTT -- -- -- -- -- --  LABPROT 23.9* -- -- 20.2* 15.3* --  INR 2.25* -- -- 1.79* 1.23 --  HEPARINUNFRC -- -- -- -- -- --  CREATININE -- -- -- -- 0.94 1.07  CKTOTAL -- -- -- -- -- --  CKMB -- -- -- -- -- --  TROPONINI <0.30 <0.30 <0.30 -- -- --   Estimated Creatinine Clearance: 64.1 ml/min (by C-G formula based on Cr of 0.94).  Medical History: Past Medical History  Diagnosis Date  . Anemia     Hemoglobin 11.6 in 04/2008->resolved   . Edema     Lower Extremity  . Tobacco abuse   . Glaucoma(365)   . Allergic rhinitis   . Atrial fibrillation     Coumadin;asymptomatic bradycardia on telemetry in 12/2009  . CHF (congestive heart failure)     H/o CHF with preserved LV EF; pulmonary hypertension; normal EF in 03/2009  . Hypertension   . Shingles   . Shortness of breath   . COPD (chronic obstructive pulmonary disease)   . Asthma    Medications:  Scheduled:     . antiseptic oral rinse  15 mL Mouth Rinse BID  . benzonatate  100 mg Oral TID  . brimonidine  1 drop Both Eyes BID   And  . timolol  1 drop Both Eyes BID  . budesonide-formoterol  2 puff Inhalation BID  . furosemide  40 mg Oral Daily  . levalbuterol  0.63 mg Nebulization QID  . levofloxacin  500 mg Oral Daily  . lisinopril  5 mg Oral Daily  . potassium chloride  10 mEq Oral QPC breakfast  . predniSONE  30 mg Oral Q breakfast  . sodium chloride  3 mL Intravenous Q12H  . sodium chloride  3 mL Intravenous Q12H  . tiotropium  18  mcg Inhalation Daily  . travoprost (benzalkonium)  1 drop Both Eyes QHS  . warfarin  5 mg Oral q1800  . [COMPLETED] warfarin  7.5 mg Oral ONCE-1800  . Warfarin - Pharmacist Dosing Inpatient   Does not apply q1800    Assessment: 71yo male on chronic Coumadin for Afib.  Pt reportedly takes 8mg  on Tue and Fri and 4mg  other days.  INR is sub-therapeutic on admission but is now therapeutic after 7.5mg  x 2 doses.  I would favor trying to simplify regimen so will change to Coumadin 5mg  daily for now.  Anticipate coumadin requirements will be 5-6mg  daily to maintain therapeutic INR.  Goal of Therapy:  INR 2-3 Monitor platelets by anticoagulation protocol: Yes   Plan: Coumadin 5mg  daily Continue INR daily until therapeutic and stable.  Margo Aye, Yaire Kreher A 03/18/2012,10:53 AM

## 2012-03-18 NOTE — Care Management Note (Signed)
    Page 1 of 2   03/19/2012     4:47:17 PM   CARE MANAGEMENT NOTE 03/19/2012  Patient:  Theodore [name], Theodore [name]   Account Number:  [id number]  Date Initiated:  03/18/2012  Documentation initiated by:  Theodore [name]  Subjective/Objective Assessment:   Pt admitted from home with COPD, Bronchitis and Brady with pauses. Lives at home with spouse. Currently has [facility] RN but unsure who the agency is.     Action/Plan:   Anticipated DC Date:  03/20/2012   Anticipated DC Plan:  HOME W HOME HEALTH SERVICES      DC Planning Services  CM consult      Choice offered to / List presented to:          [name] arranged  [facility]-1 RN      Theodore [name] agency  [facility]   Status of service:  Completed, signed off Medicare Important Message given?   (If response is "NO", the following Medicare IM given date fields will be blank) Date Medicare IM given:   Date Additional Medicare IM given:    Discharge Disposition:  HOME W HOME HEALTH SERVICES  Per UR Regulation:    If discussed at Long Length of Stay Meetings, dates discussed:    Comments:  03/18/12 Theodore Holms RN BSN CM Spoke with son who was in the home. He states the [name] nurse is Theodore [name] but they do not know the agency. He will ask his mother when she returns from Dialysis and leave CM a VM. Son called. Theodore [name] is the name and # is [phone]. Email from [place] with [employer] "Theodore [name] lives and an unheated two-story home with poor insulation. He and his wife are heating individual rooms with two separate kerosene heaters. They also leave the oven door open with the oven on during the day to keep the kitchen warm. Cold air and fuel fumes may have contributed to this exacerbation." Per Theodore [name] they have assisted in all ways but it is a difficult home setting. CM will contact [facility] here at [facility]

## 2012-03-19 LAB — CBC
HCT: 37.9 % — ABNORMAL LOW (ref 39.0–52.0)
Hemoglobin: 11.6 g/dL — ABNORMAL LOW (ref 13.0–17.0)
MCH: 29 pg (ref 26.0–34.0)
MCHC: 30.6 g/dL (ref 30.0–36.0)
MCV: 94.8 fL (ref 78.0–100.0)
RDW: 13.2 % (ref 11.5–15.5)

## 2012-03-19 LAB — BASIC METABOLIC PANEL
BUN: 24 mg/dL — ABNORMAL HIGH (ref 6–23)
Chloride: 95 mEq/L — ABNORMAL LOW (ref 96–112)
Creatinine, Ser: 0.97 mg/dL (ref 0.50–1.35)
GFR calc Af Amer: >90 mL/min (ref >90)
Glucose, Bld: 83 mg/dL (ref 70–99)
Potassium: 4.6 mEq/L (ref 3.5–5.1)

## 2012-03-19 LAB — PROTIME-INR: Prothrombin Time: 29.5 seconds — ABNORMAL HIGH (ref 11.6–15.2)

## 2012-03-19 MED ORDER — ALBUTEROL SULFATE (2.5 MG/3ML) 0.083% IN NEBU: 2.5000 mg | INHALATION_SOLUTION | Freq: Three times a day (TID) | RESPIRATORY_TRACT | Status: DC

## 2012-03-19 MED ORDER — WARFARIN SODIUM 1 MG PO TABS: 1.0000 mg | ORAL_TABLET | Freq: Once | ORAL | Status: DC

## 2012-03-19 MED ORDER — LISINOPRIL 5 MG PO TABS: 5.0000 mg | ORAL_TABLET | Freq: Every day | ORAL | Status: DC

## 2012-03-19 MED ORDER — PREDNISONE 10 MG PO TABS: ORAL_TABLET | ORAL | Status: DC

## 2012-03-19 MED ORDER — LEVOFLOXACIN 500 MG PO TABS: 500.0000 mg | ORAL_TABLET | Freq: Every day | ORAL | Status: DC

## 2012-03-19 NOTE — Progress Notes (Signed)
   Primary cardiologist: Dr. [place] Bing  SUBJECTIVE:Feels good. No complaints of chest pain or dizziness. Some chronic DOE.  LABS: Basic Metabolic Panel:  Basename 03/19/12 0500  NA 135  K 4.6  CL 95*  CO2 40*  GLUCOSE 83  BUN 24*  CREATININE 0.97  CALCIUM 9.2  MG --  PHOS --   CBC:  Basename 03/19/12 0500  WBC 12.0*  NEUTROABS --  HGB 11.6*  HCT 37.9*  MCV 94.8  PLT 216   Cardiac Enzymes:  Basename 03/18/12 0317 03/17/12 2140 03/17/12 1507  CKTOTAL -- -- --  CKMB -- -- --  CKMBINDEX -- -- --  TROPONINI <0.30 <0.30 <0.30   Thyroid Function Tests:  Basename 03/18/12 0906  TSH 1.179  T4TOTAL --  T3FREE --  THYROIDAB --    PHYSICAL EXAM BP 135/86  Pulse 72  Temp 97.7 F (36.5 C) (Oral)  Resp 19  Ht 5\' 6"  (1.676 m)  Wt 156 lb 11.2 oz (71.079 kg)  BMI 25.29 kg/m2  SpO2 92% General: No acute distress Lungs: Decreased throughout, no wheezing. Heart: Irregularly irregular, no gallop. Extremities: Mild edema.  DP +1  TELEMETRY: Reviewed telemetry pt in atrial fibrillation rates between 69-129 when up walking.  Echocardiogram 1/6: Study Conclusions  - Left ventricle: The cavity size was normal. Wall thickness was increased in a pattern of mild LVH. Systolic function was normal. The estimated ejection fraction was in the range of 50% to 55%. There is hypokinesis of the basal-midinferior myocardium. Features are consistent with a pseudonormal left ventricular filling pattern, with concomitant abnormal relaxation and increased filling pressure (grade 2 diastolic dysfunction). - Aortic valve: Mildly calcified annulus. Trileaflet. - Mitral valve: Calcified annulus. Systolic bowing without prolapse. Mild regurgitation. - Left atrium: The atrium was severely dilated. - Right ventricle: Systolic function was mildly reduced. - Right atrium: The atrium was severely dilated. - Tricuspid valve: Mild regurgitation. - Pulmonary arteries: Systolic  pressure was severely increased. PA peak pressure: 70mm Hg (S).   ASSESSMENT AND PLAN:  1. Atrial Fibrillation: Heart rate averaging in the 70-90's, with some elevations into the 120's when he is up walking around in the room. There are a few instances of brady with pauses, <2.0 seconds over the last 24 hours. He is asymptomatic with this. He is off all rate control medications at this time.  2. Hypertension: Blood pressure is well controlled on low dose lisinopril, Creatinine is 0.97 this am.  3. Diastolic Dysfunction: Continue lasix at 40 mg daily, and ACE inhibitor.  4. Severe pulmonary hypertension: History of COPD, also mild RV dysfunction.  Bettey Mare. Lyman Bishop NP [facility] 03/19/2012, 8:13 AM  Attending note:  Patient seen and examined. Reviewed followup testing and modified above note by Ms. Lawrence NP. Main issue is maintenance of adequate heart rate control in atrial fibrillation without precipitating symptomatically bradycardia or significant pauses. At this point his heart rate control seems adequate, increases only when he ambulates. He is now not requiring any rate lowering medications. No new additions at this point, although might ultimately consider low-dose diltiazem or perhaps beta blocker if needed. No indication for pacemaker. Otherwise continued management of pulmonary status.  Jonelle Sidle, M.D., F.A.C.C.

## 2012-03-19 NOTE — Progress Notes (Signed)
ANTICOAGULATION CONSULT NOTE   Pharmacy Consult for Coumadin Indication: atrial fibrillation  No Known Allergies  Patient Measurements: Height: 5\' 6"  (167.6 cm) Weight: 156 lb 11.2 oz (71.079 kg) IBW/kg (Calculated) : 63.8   Vital Signs: Temp: 97.7 F (36.5 C) (01/07 0516) Temp src: Oral (01/07 0516) BP: 135/86 mmHg (01/07 0516) Pulse Rate: 72  (01/07 0516)  Labs:  Basename 03/19/12 0500 03/18/12 0317 03/17/12 2140 03/17/12 1507 03/17/12 0500  HGB 11.6* -- -- -- --  HCT 37.9* -- -- -- --  PLT 216 -- -- -- --  APTT -- -- -- -- --  LABPROT 29.5* 23.9* -- -- 20.2*  INR 2.99* 2.25* -- -- 1.79*  HEPARINUNFRC -- -- -- -- --  CREATININE 0.97 -- -- -- --  CKTOTAL -- -- -- -- --  CKMB -- -- -- -- --  TROPONINI -- <0.30 <0.30 <0.30 --   Estimated Creatinine Clearance: 62.1 ml/min (by C-G formula based on Cr of 0.97).  Medical History: Past Medical History  Diagnosis Date  . Anemia     Hemoglobin 11.6 in 04/2008->resolved   . Edema     Lower Extremity  . Tobacco abuse   . Glaucoma(365)   . Allergic rhinitis   . Atrial fibrillation     Coumadin;asymptomatic bradycardia on telemetry in 12/2009  . CHF (congestive heart failure)     H/o CHF with preserved LV EF; pulmonary hypertension; normal EF in 03/2009  . Hypertension   . Shingles   . Shortness of breath   . COPD (chronic obstructive pulmonary disease)   . Asthma    Medications:  Scheduled:     . antiseptic oral rinse  15 mL Mouth Rinse BID  . benzonatate  100 mg Oral TID  . brimonidine  1 drop Both Eyes BID   And  . timolol  1 drop Both Eyes BID  . budesonide-formoterol  2 puff Inhalation BID  . furosemide  40 mg Oral Daily  . levalbuterol  0.63 mg Nebulization QID  . levofloxacin  500 mg Oral Daily  . lisinopril  5 mg Oral Daily  . potassium chloride  10 mEq Oral QPC breakfast  . predniSONE  30 mg Oral Q breakfast  . sodium chloride  3 mL Intravenous Q12H  . sodium chloride  3 mL Intravenous Q12H  .  tiotropium  18 mcg Inhalation Daily  . travoprost (benzalkonium)  1 drop Both Eyes QHS  . warfarin  5 mg Oral q1800  . Warfarin - Pharmacist Dosing Inpatient   Does not apply q1800    Assessment: 72yo male on chronic Coumadin for Afib.  Pt reportedly takes 8mg  on Tue and Fri and 4mg  other days.  INR was sub-therapeutic on admission but now is at upper end of goal range.  No bleeding noted.   Goal of Therapy:  INR 2-3 Monitor platelets by anticoagulation protocol: Yes   Plan: Coumadin 1mg  po x1 today INR daily  Elson Clan 03/19/2012,9:04 AM

## 2012-03-19 NOTE — Discharge Summary (Signed)
Physician Discharge Summary  Theodore [name] ZOX:[id number] DOB: November 21, 1940 DOA: 03/15/2012  PCP: Syliva Overman, MD  Admit date: 03/15/2012 Discharge date: 03/19/2012  Time spent: 40 minutes  Recommendations for Outpatient Follow-up:  1. Patient is being followed by [facility] 2. He has also been set up with home health RN 3. Follow up with primary care doctor in 2 weeks 4. Follow up with cardiology in 2 weeks  Discharge Diagnoses:  Principal Problem:  *CHRONIC OBSTRUCTIVE PULMONARY DISEASE, ACUTE EXACERBATION Active Problems:  HYPERTENSION  Atrial fibrillation  Long term current use of anticoagulant  Glaucoma  Hypoxia  Acute bronchitis  Bradycardia   Discharge Condition: improved  Diet recommendation: low salt  Filed Weights   03/15/12 2047  Weight: 71.079 kg (156 lb 11.2 oz)    History of present illness:  Theodore [name] is a 72 y.o. male with a past medical history of atrial fibrillation, emphysema, history of tobacco abuse, who quit smoking about 4 weeks ago, who was in his usual state of health about 3 days ago, when he started having a cough. He looks like he spoke to his primary care physician and was prescribed steroids recently. He had similar episode back in October and was prescribed steroids. His primary care physician has been working with him regarding smoking cessation. And he finally quit smoking 4 weeks ago. He tells me that his wife had a "stomach bug" last week. He started having cough and shortness of breath 3 days ago. He has been feeling weak. Denies any fever. He's been coughing up whitish expectoration. Denies any fever or chills. Denies any chest pain. Has some swelling in his legs for which he takes diuretics. Denies using oxygen at home. He has been wheezing as well. He's taken the breathing treatments, as well as inhalers at home, with no effect. Since he wasn't improving, he decided to come in to the hospital. After receiving nebulizer treatments here in the  ED, he is feeling a little better.  Hospital Course:  Patient was admitted to the hospital for shortness of breath. He was found to have COPD exacerbation. He was started on steroid therapy with steroids, nebulizer treatments, antibiotics. The basal he did improve and is now ambulating on room air without any difficulty. He is maintaining his saturations above 95%. He has been transitioned to oral prednisone will complete a total of 7 days of antibiotics. We will continue his nebulization treatments at home. Patient was noted to be significantly bradycardic and had sinus pauses on telemetry. He was seen by cardiology and beta blocker and calcium channel blocker were discontinued. He was further monitored on telemetry his heart rate has improved and is not having further pauses. Pacemaker was not felt to be indicated at this point. Since his heart rate is in normal range at this point, resumption of AV nodal blocking agents was not recommended. Patient will followup with cardiology as if his heart rate is further elevated, this can be resumed. 2-D echocardiogram was done which did show preserved ejection fraction. He is continued on anticoagulation for atrial fibrillation. Currently patient is back to his baseline level of functioning. He feels well enough to go home. He is followed by [facility] at home and we will set up home health. He is recommended to follow up with his regular physician in the next one to 2 weeks.  Procedures:  none  Consultations:  Cardiology, Dr. Diona Browner  Discharge Exam: Filed Vitals:   03/19/12 0711 03/19/12 1125 03/19/12 1345 03/19/12  1556  BP:   132/79   Pulse:   68   Temp:   97.8 F (36.6 C)   TempSrc:   Oral   Resp:   18   Height:      Weight:      SpO2: 92% 94% 98% 93%    General: NAD Cardiovascular: s1, s2, irregular Respiratory: cta b  Discharge Instructions  Discharge Orders    Future Appointments: Provider: Department: Dept Phone: Center:   04/02/2012  3:30 PM Kerri Perches, MD  Primary Care [phone] [facility]   04/03/2012 1:20 PM Jodelle Gross, NP [facility] [place] [phone] [id number]     Future Orders Please Complete By Expires   Diet - low sodium heart healthy      Increase activity slowly      Call MD for:  difficulty breathing, headache or visual disturbances      Call MD for:  temperature >100.4          Medication List     As of 03/19/2012  4:38 PM    STOP taking these medications         carvedilol 3.125 MG tablet   Commonly known as: COREG      diltiazem 300 MG 24 hr capsule   Commonly known as: CARDIZEM CD      predniSONE 5 MG Tabs   Commonly known as: STERAPRED UNI-PAK      TAKE these medications         albuterol 108 (90 BASE) MCG/ACT inhaler   Commonly known as: PROVENTIL HFA;VENTOLIN HFA   Inhale 2 puffs into the lungs every 4 (four) hours as needed for wheezing.      albuterol (2.5 MG/3ML) 0.083% nebulizer solution   Commonly known as: PROVENTIL   Take 3 mLs (2.5 mg total) by nebulization 3 (three) times daily.      budesonide-formoterol 160-4.5 MCG/ACT inhaler   Commonly known as: SYMBICORT   Inhale 2 puffs into the lungs 2 (two) times daily.      COMBIGAN 0.2-0.5 % ophthalmic solution   Generic drug: brimonidine-timolol   Place 1 drop into both eyes every 12 (twelve) hours.      furosemide 40 MG tablet   Commonly known as: LASIX   Take 1 tablet (40 mg total) by mouth daily.      levofloxacin 500 MG tablet   Commonly known as: LEVAQUIN   Take 1 tablet (500 mg total) by mouth daily.      lisinopril 5 MG tablet   Commonly known as: PRINIVIL,ZESTRIL   Take 1 tablet (5 mg total) by mouth daily.      potassium chloride 10 MEQ tablet   Commonly known as: K-DUR   Take 1 tablet (10 mEq total) by mouth daily after breakfast.      predniSONE 10 MG tablet   Commonly known as: DELTASONE   Take 20mg  po daily for 2 days then 10mg  po daily for 2 days then stop       tiotropium 18 MCG inhalation capsule   Commonly known as: SPIRIVA   Place 1 capsule (18 mcg total) into inhaler and inhale daily.      travoprost (benzalkonium) 0.004 % ophthalmic solution   Commonly known as: TRAVATAN   Place 1 drop into both eyes at bedtime.      warfarin 4 MG tablet   Commonly known as: COUMADIN   Take 4-8 mg by mouth daily. Take 2 tablets (8 mg) on Tuesdays  and Fridays, then take 1 tablet (4 mg) on Sundays, Mondays,Wednesdays,Thursdays,Saturdays           Follow-up Information    Follow up with Syliva Overman, MD. On 04/02/2012. (Appointment scheduled for 3:30 pm.)    Contact information:   [address], [address] [place] [address] [phone]       Follow up with [place] Bing, MD. On 04/03/2012. (Appointment is scheduled for 1:20 pm)    Contact information:   [address] [place] [address] [phone]           The results of significant diagnostics from this hospitalization (including imaging, microbiology, ancillary and laboratory) are listed below for reference.    Significant Diagnostic Studies: Dg Chest 2 View  03/15/2012  *RADIOLOGY REPORT*  Clinical Data: Cough and fever, shortness of breath  CHEST - 2 VIEW  Comparison: 12/28/2011  Findings: Colon projects over the upper abdomen.  Mild moderate enlargement of the cardiomediastinal silhouette is stable.  The lungs are clear.  No pleural effusion.  No acute osseous finding.  IMPRESSION: Mild to moderate cardiomegaly, no acute cardiopulmonary process.   Original Report Authenticated By: Christiana Pellant, M.D.     Microbiology: No results found for this or any previous visit (from the past 240 hour(s)).   Labs: Basic Metabolic Panel:  Lab 03/19/12 2952 03/16/12 0606 03/15/12 1453  NA 135 138 139  K 4.6 4.0 3.7  CL 95* 97 97  CO2 40* 34* 33*  GLUCOSE 83 147* 113*  BUN 24* 20 25*  CREATININE 0.97 0.94 1.07  CALCIUM 9.2 8.6 8.8  MG -- 1.8 --  PHOS -- -- --   Liver  Function Tests:  Lab 03/16/12 0606 03/15/12 1453  AST 24 30  ALT 12 15  ALKPHOS 74 86  BILITOT 0.4 0.6  PROT 6.9 7.7  ALBUMIN 3.0* 3.5   No results found for this basename: LIPASE:5,AMYLASE:5 in the last 168 hours No results found for this basename: AMMONIA:5 in the last 168 hours CBC:  Lab 03/19/12 0500 03/16/12 0606 03/15/12 1453  WBC 12.0* 3.3* 5.5  NEUTROABS -- -- 3.7  HGB 11.6* 11.3* 12.4*  HCT 37.9* 36.2* 39.9  MCV 94.8 94.0 94.1  PLT 216 207 216   Cardiac Enzymes:  Lab 03/18/12 0317 03/17/12 2140 03/17/12 1507 03/15/12 1453  CKTOTAL -- -- -- --  CKMB -- -- -- --  CKMBINDEX -- -- -- --  TROPONINI <0.30 <0.30 <0.30 <0.30   BNP: BNP (last 3 results)  Basename 03/15/12 1453  PROBNP 2263.0*   CBG: No results found for this basename: GLUCAP:5 in the last 168 hours     Signed:  Nara Paternoster  [facility] 03/19/2012, 4:38 PM

## 2012-03-19 NOTE — Progress Notes (Signed)
Pt discharged home today per Dr. Kerry Hough with Home Health. Pt's IV site D/C'd and WNL. Pt's VS stable at this time. Pt provided with home medication list, discharge instructions, prescriptions, returned home meds from pharmacy, and given both inhalers. Pt verbalized understanding. Pt left floor via WC in stable condition accompanied by NT.

## 2012-03-19 NOTE — Progress Notes (Signed)
Pt's Home Health nurse, Elease Hashimoto, called and made aware of patient's discharge. Stated she would call and touch base with patient tomorrow. Will relay message to patient.

## 2012-03-20 ENCOUNTER — Telehealth: Payer: Self-pay

## 2012-03-20 ENCOUNTER — Other Ambulatory Visit: Payer: Self-pay | Admitting: Family Medicine

## 2012-03-20 MED ORDER — TEMAZEPAM 15 MG PO CAPS: 15.0000 mg | ORAL_CAPSULE | Freq: Every evening | ORAL | Status: DC | PRN

## 2012-03-20 NOTE — Telephone Encounter (Signed)
[  pls have her collect restoril which I am printing

## 2012-03-20 NOTE — Telephone Encounter (Signed)
Faxed to [facility] [place] and [name] will collect

## 2012-03-20 NOTE — Telephone Encounter (Signed)
States patient is home now but breathing is still bad. Having to heat with kerosene and the smell is very strong. Received a lot of steriods and nebs in the hospital and was not able to sleep at all lastnight. Wants to know if he can have something short term to help him rest. If so, [name] will pick it up at [facility] this afternoon

## 2012-03-21 ENCOUNTER — Telehealth: Payer: Self-pay | Admitting: Family Medicine

## 2012-03-22 MED ORDER — LISINOPRIL 5 MG PO TABS: 5.0000 mg | ORAL_TABLET | Freq: Every day | ORAL | Status: DC

## 2012-03-22 MED ORDER — LEVOFLOXACIN 500 MG PO TABS: 500.0000 mg | ORAL_TABLET | Freq: Every day | ORAL | Status: DC

## 2012-03-22 MED ORDER — PREDNISONE 10 MG PO TABS: ORAL_TABLET | ORAL | Status: DC

## 2012-03-22 NOTE — Telephone Encounter (Signed)
The meds he was given at ED which were printed off- he states he doesn't have the rx's. Sent them to his pharmacy

## 2012-03-29 ENCOUNTER — Emergency Department (HOSPITAL_COMMUNITY)
Admission: EM | Admit: 2012-03-29 | Discharge: 2012-03-29 | Disposition: A | Discharge status: Home / Self Care | Payer: Medicare Other | Attending: Emergency Medicine | Admitting: Emergency Medicine

## 2012-03-29 ENCOUNTER — Emergency Department (HOSPITAL_COMMUNITY): Payer: Medicare Other

## 2012-03-29 ENCOUNTER — Encounter (HOSPITAL_COMMUNITY): Payer: Self-pay | Admitting: *Deleted

## 2012-03-29 DIAGNOSIS — J449 Chronic obstructive pulmonary disease, unspecified: Secondary | ICD-10-CM | POA: Insufficient documentation

## 2012-03-29 DIAGNOSIS — Z79899 Other long term (current) drug therapy: Secondary | ICD-10-CM | POA: Insufficient documentation

## 2012-03-29 DIAGNOSIS — J45909 Unspecified asthma, uncomplicated: Secondary | ICD-10-CM | POA: Insufficient documentation

## 2012-03-29 DIAGNOSIS — I10 Essential (primary) hypertension: Secondary | ICD-10-CM | POA: Insufficient documentation

## 2012-03-29 DIAGNOSIS — R609 Edema, unspecified: Secondary | ICD-10-CM | POA: Insufficient documentation

## 2012-03-29 DIAGNOSIS — J4489 Other specified chronic obstructive pulmonary disease: Secondary | ICD-10-CM | POA: Insufficient documentation

## 2012-03-29 DIAGNOSIS — I509 Heart failure, unspecified: Secondary | ICD-10-CM

## 2012-03-29 DIAGNOSIS — R059 Cough, unspecified: Secondary | ICD-10-CM | POA: Insufficient documentation

## 2012-03-29 DIAGNOSIS — Z8619 Personal history of other infectious and parasitic diseases: Secondary | ICD-10-CM | POA: Insufficient documentation

## 2012-03-29 DIAGNOSIS — Z7901 Long term (current) use of anticoagulants: Secondary | ICD-10-CM | POA: Insufficient documentation

## 2012-03-29 DIAGNOSIS — R0602 Shortness of breath: Secondary | ICD-10-CM

## 2012-03-29 DIAGNOSIS — R600 Localized edema: Secondary | ICD-10-CM

## 2012-03-29 DIAGNOSIS — H409 Unspecified glaucoma: Secondary | ICD-10-CM | POA: Insufficient documentation

## 2012-03-29 DIAGNOSIS — R05 Cough: Secondary | ICD-10-CM | POA: Insufficient documentation

## 2012-03-29 DIAGNOSIS — F17200 Nicotine dependence, unspecified, uncomplicated: Secondary | ICD-10-CM | POA: Insufficient documentation

## 2012-03-29 DIAGNOSIS — Z8679 Personal history of other diseases of the circulatory system: Secondary | ICD-10-CM | POA: Insufficient documentation

## 2012-03-29 DIAGNOSIS — Z862 Personal history of diseases of the blood and blood-forming organs and certain disorders involving the immune mechanism: Secondary | ICD-10-CM | POA: Insufficient documentation

## 2012-03-29 DIAGNOSIS — M7989 Other specified soft tissue disorders: Secondary | ICD-10-CM | POA: Insufficient documentation

## 2012-03-29 LAB — CBC WITH DIFFERENTIAL/PLATELET
Basophils Relative: 0 % (ref 0–1)
Eosinophils Absolute: 0.1 10*3/uL (ref 0.0–0.7)
Eosinophils Relative: 1 % (ref 0–5)
Lymphs Abs: 0.9 10*3/uL (ref 0.7–4.0)
MCH: 29.9 pg (ref 26.0–34.0)
MCHC: 31.4 g/dL (ref 30.0–36.0)
MCV: 95.1 fL (ref 78.0–100.0)
Neutrophils Relative %: 79 % — ABNORMAL HIGH (ref 43–77)
Platelets: 221 10*3/uL (ref 150–400)
RBC: 4.12 MIL/uL — ABNORMAL LOW (ref 4.22–5.81)
RDW: 14.2 % (ref 11.5–15.5)

## 2012-03-29 LAB — COMPREHENSIVE METABOLIC PANEL
ALT: 26 U/L (ref 0–53)
Albumin: 3.5 g/dL (ref 3.5–5.2)
Alkaline Phosphatase: 88 U/L (ref 39–117)
Calcium: 9.4 mg/dL (ref 8.4–10.5)
GFR calc Af Amer: >90 mL/min (ref >90)
Glucose, Bld: 84 mg/dL (ref 70–99)
Potassium: 4.3 mEq/L (ref 3.5–5.1)
Sodium: 143 mEq/L (ref 135–145)
Total Protein: 7.3 g/dL (ref 6.0–8.3)

## 2012-03-29 LAB — D-DIMER, QUANTITATIVE: D-Dimer, Quant: <0.27 ug/mL-FEU (ref 0.00–0.48)

## 2012-03-29 LAB — TROPONIN I: Troponin I: <0.3 ng/mL (ref <0.30)

## 2012-03-29 MED ORDER — FUROSEMIDE 10 MG/ML IJ SOLN
60.0000 mg | Freq: Once | INTRAMUSCULAR | Status: AC
  Administered 2012-03-29: 60 mg via INTRAVENOUS
  Filled 2012-03-29: qty 6

## 2012-03-29 MED ORDER — ALBUTEROL SULFATE (5 MG/ML) 0.5% IN NEBU
5.0000 mg | INHALATION_SOLUTION | Freq: Once | RESPIRATORY_TRACT | Status: AC
  Administered 2012-03-29: 5 mg via RESPIRATORY_TRACT
  Filled 2012-03-29: qty 1

## 2012-03-29 MED ORDER — IPRATROPIUM BROMIDE 0.02 % IN SOLN
0.5000 mg | Freq: Once | RESPIRATORY_TRACT | Status: AC
  Administered 2012-03-29: 0.5 mg via RESPIRATORY_TRACT
  Filled 2012-03-29: qty 2.5

## 2012-03-29 MED ORDER — NITROGLYCERIN 2 % TD OINT
1.0000 [in_us] | TOPICAL_OINTMENT | Freq: Once | TRANSDERMAL | Status: AC
  Administered 2012-03-29: 1 [in_us] via TOPICAL
  Filled 2012-03-29: qty 1

## 2012-03-29 NOTE — ED Notes (Signed)
Sent from Dr simpsons office with sob   Recent adm with sob,  Sob for 2-3 days,  White sputum, no fever.

## 2012-03-29 NOTE — ED Notes (Signed)
Oxygen stayed around 96% while ambulating pt. Pt states he feels fine, no dizziness or sob

## 2012-03-29 NOTE — ED Provider Notes (Signed)
History  This chart was scribed for Ward Givens, MD by Madaline Brilliant, ED Scribe. The patient was seen in room APA12/APA12. Patient's care was started at 1218.   CSN: [id number]  Arrival date & time 03/29/12  1152   First MD Initiated Contact with Patient 03/29/12 1218      Chief Complaint  Patient presents with  . Shortness of Breath    (Consider location/radiation/quality/duration/timing/severity/associated sxs/prior treatment) The history is provided by the patient. No language interpreter was used.   Theodore [name] is a 72 y.o. male who presents to the Emergency Department complaining of a chronic moderate SOB that worsened 3-4 days ago. Symptoms have been constant. Breathing problems starting when he was last discharged from hospital (admited for SOB and lower extremity edema from 1/3 to 1/7) . Legs have been swelling since he has been home. Pt reports that he has productive cough with white sputum. He denies fevers, chest pain, abdominal pain, weakness and any other pain. Pt reports walking aggravates the SOB. He states he is taking fluid pills, but c/o taking the potassium pills because they give him leg cramps   PCP is Dr. Lodema Hong.  Past Medical History  Diagnosis Date  . Anemia     Hemoglobin 11.6 in 04/2008->resolved   . Edema     Lower Extremity  . Tobacco abuse   . Glaucoma(365)   . Allergic rhinitis   . Atrial fibrillation     Coumadin;asymptomatic bradycardia on telemetry in 12/2009  . CHF (congestive heart failure)     H/o CHF with preserved LV EF; pulmonary hypertension; normal EF in 03/2009  . Hypertension   . Shingles   . Shortness of breath   . COPD (chronic obstructive pulmonary disease)   . Asthma     Past Surgical History  Procedure Date  . Bilateral cataract surgery   . Herniorrhapy   . Tendon repair     right hand surgical procedure for a tendon repair    Family History  Problem Relation Age of Onset  . Cancer Mother   . Cancer Father   .  Coronary artery disease Brother     History  Substance Use Topics  . Smoking status: Smoker -- 0.5 packs/day for 55 years    Types: Cigarettes    Quit date: 02/13/2012  . Smokeless tobacco: Never Used  . Alcohol Use: No   Lives at home Lives with spouse No home oxygen Quit smoking 7-8 weeks ago   Review of Systems  Respiratory: Positive for cough and shortness of breath.   Cardiovascular: Positive for leg swelling.  All other systems reviewed and are negative.    Allergies  Review of patient's allergies indicates no known allergies.  Home Medications   Current Outpatient Rx  Name  Route  Sig  Dispense  Refill  . ALBUTEROL SULFATE HFA 108 (90 BASE) MCG/ACT IN AERS   Inhalation   Inhale 2 puffs into the lungs every 4 (four) hours as needed for wheezing.   8.5 g   3   . ALBUTEROL SULFATE (2.5 MG/3ML) 0.083% IN NEBU   Nebulization   Take 3 mLs (2.5 mg total) by nebulization 3 (three) times daily.   75 mL   1   . BRIMONIDINE TARTRATE-TIMOLOL 0.2-0.5 % OP SOLN   Both Eyes   Place 1 drop into both eyes every 12 (twelve) hours.           . BUDESONIDE-FORMOTEROL FUMARATE 160-4.5 MCG/ACT IN AERO  Inhalation   Inhale 2 puffs into the lungs 2 (two) times daily.   10.2 g   3   . FUROSEMIDE 40 MG PO TABS   Oral   Take 1 tablet (40 mg total) by mouth daily.   60 tablet   3   . LEVOFLOXACIN 500 MG PO TABS   Oral   Take 1 tablet (500 mg total) by mouth daily.   2 tablet   0   . LISINOPRIL 5 MG PO TABS   Oral   Take 1 tablet (5 mg total) by mouth daily.   30 tablet   1   . POTASSIUM CHLORIDE ER 10 MEQ PO TBCR   Oral   Take 1 tablet (10 mEq total) by mouth daily after breakfast.   30 tablet   5     Discontinue potassium cl effective 02/28/201 ...   . PREDNISONE 10 MG PO TABS      Take 20mg  po daily for 2 days then 10mg  po daily for 2 days then stop   6 tablet   0   . TEMAZEPAM 15 MG PO CAPS   Oral   Take 1 capsule (15 mg total) by mouth at  bedtime as needed for sleep.   30 capsule   0   . TIOTROPIUM BROMIDE MONOHYDRATE 18 MCG IN CAPS   Inhalation   Place 1 capsule (18 mcg total) into inhaler and inhale daily.   30 capsule   3   . TRAVOPROST 0.004 % OP SOLN   Both Eyes   Place 1 drop into both eyes at bedtime.          . WARFARIN SODIUM 4 MG PO TABS   Oral   Take 4-8 mg by mouth daily. Take 2 tablets (8 mg) on Tuesdays and Fridays, then take 1 tablet (4 mg) on Sundays, Mondays,Wednesdays,Thursdays,Saturdays           Triage Vitals:  BP 146/102  Pulse 120  Temp 97.7 F (36.5 C) (Oral)  Resp 24  Ht 5\' 6"  (1.676 m)  Wt 160 lb (72.576 kg)  BMI 25.82 kg/m2  SpO2 84%  Vital signs normal except for hypertension, tachycardia, and hypoxia (? Error)   Physical Exam  Nursing note and vitals reviewed. Constitutional: He is oriented to person, place, and time. He appears well-developed and well-nourished.  Non-toxic appearance. He does not appear ill. No distress.  HENT:  Head: Normocephalic and atraumatic.  Right Ear: External ear normal.  Left Ear: External ear normal.  Nose: Nose normal. No mucosal edema or rhinorrhea.  Mouth/Throat: Oropharynx is clear and moist and mucous membranes are normal. No dental abscesses or uvula swelling.  Eyes: Conjunctivae normal and EOM are normal. Pupils are equal, round, and reactive to light.  Neck: Normal range of motion and full passive range of motion without pain. Neck supple.  Cardiovascular: Normal rate, regular rhythm and normal heart sounds.  Exam reveals no gallop and no friction rub.   No murmur heard. Pulmonary/Chest: Effort normal. No respiratory distress. He has wheezes. He has no rhonchi. He has no rales. He exhibits no tenderness and no crepitus.       Good breath sounds. Rare end expiratory wheezing.   Abdominal: Soft. Normal appearance and bowel sounds are normal. He exhibits no distension. There is no tenderness. There is no rebound and no guarding.    Musculoskeletal: Normal range of motion. He exhibits edema. He exhibits no tenderness.  Moves all extremities well. He has +2 bilateral edema of lower legs.  Neurological: He is alert and oriented to person, place, and time. He has normal strength. No cranial nerve deficit.  Skin: Skin is warm, dry and intact. No rash noted. No erythema. No pallor.  Psychiatric: He has a normal mood and affect. His speech is normal and behavior is normal. His mood appears not anxious.    ED Course  Procedures (including critical care time)  Medications  furosemide (LASIX) injection 60 mg (60 mg Intravenous Given 03/29/12 1315)  albuterol (PROVENTIL) (5 MG/ML) 0.5% nebulizer solution 5 mg (5 mg Nebulization Given 03/29/12 1308)  ipratropium (ATROVENT) nebulizer solution 0.5 mg (0.5 mg Nebulization Given 03/29/12 1308)  nitroGLYCERIN (NITROGLYN) 2 % ointment 1 inch (1 inch Topical Given 03/29/12 1318)    DIAGNOSTIC STUDIES: Oxygen Saturation is 84% on [place], low  by my interpretation.    COORDINATION OF CARE: 1234 Plan is to give more IV diuretic and administer breathing treatment. Patient informed of current plan for treatment and evaluation and agrees with plan at this time.   1426 On follow-up, patient is able to walk around room. His lungs are clear. He states that he feels better.  He has almost filled a urinal with urine output after getting the Lasix. We'll check his pulse ox while ambulating and if not hypoxic he feels ready to go home. We discussed increasing his Lasix dose for the next couple days.  Per nursing staff Oxygen stayed around 96% while ambulating pt. Pt states he feels fine, no dizziness or sob   Results for orders placed during the hospital encounter of 03/29/12  CBC WITH DIFFERENTIAL      Component Value Range   WBC 11.8 (*) 4.0 - 10.5 K/uL   RBC 4.12 (*) 4.22 - 5.81 MIL/uL   Hemoglobin 12.3 (*) 13.0 - 17.0 g/dL   HCT 16.1  09.6 - 04.5 %   MCV 95.1  78.0 - 100.0 fL   MCH  29.9  26.0 - 34.0 pg   MCHC 31.4  30.0 - 36.0 g/dL   RDW 40.9  81.1 - 91.4 %   Platelets 221  150 - 400 K/uL   Neutrophils Relative 79 (*) 43 - 77 %   Neutro Abs 9.3 (*) 1.7 - 7.7 K/uL   Lymphocytes Relative 7 (*) 12 - 46 %   Lymphs Abs 0.9  0.7 - 4.0 K/uL   Monocytes Relative 13 (*) 3 - 12 %   Monocytes Absolute 1.5 (*) 0.1 - 1.0 K/uL   Eosinophils Relative 1  0 - 5 %   Eosinophils Absolute 0.1  0.0 - 0.7 K/uL   Basophils Relative 0  0 - 1 %   Basophils Absolute 0.0  0.0 - 0.1 K/uL  COMPREHENSIVE METABOLIC PANEL      Component Value Range   Sodium 143  135 - 145 mEq/L   Potassium 4.3  3.5 - 5.1 mEq/L   Chloride 102  96 - 112 mEq/L   CO2 36 (*) 19 - 32 mEq/L   Glucose, Bld 84  70 - 99 mg/dL   BUN 28 (*) 6 - 23 mg/dL   Creatinine, Ser 7.82  0.50 - 1.35 mg/dL   Calcium 9.4  8.4 - 95.6 mg/dL   Total Protein 7.3  6.0 - 8.3 g/dL   Albumin 3.5  3.5 - 5.2 g/dL   AST 23  0 - 37 U/L   ALT 26  0 - 53 U/L  Alkaline Phosphatase 88  39 - 117 U/L   Total Bilirubin 0.4  0.3 - 1.2 mg/dL   GFR calc non Af Amer 81 (*) >90 mL/min   GFR calc Af Amer >90  >90 mL/min  TROPONIN I      Component Value Range   Troponin I <0.30  <0.30 ng/mL  PRO B NATRIURETIC PEPTIDE      Component Value Range   Pro B Natriuretic peptide (BNP) 3788.0 (*) 0 - 125 pg/mL  D-DIMER, QUANTITATIVE      Component Value Range   D-Dimer, Quant <0.27  0.00 - 0.48 ug/mL-FEU    Laboratory interpretation all normal except for mild leukocytosis, elevated bicarbonate  Dg Chest 2 View  03/29/2012  *RADIOLOGY REPORT*  Clinical Data: Shortness of breath.  CHEST - 2 VIEW  Comparison: The two-view chest 03/15/2012.  Findings: The heart is mildly enlarged.  Hilar right paratracheal prominence is evident.  Question sarcoid.  Emphysematous changes are present.  No focal airspace consolidation is evident. Degenerative changes are noted in the mid thoracic spine.  IMPRESSION:  1.  Mild cardiomegaly without failure. 2.  Mild prominence of  the hila and right paratracheal stripe.  The small lymph nodes have been noted before.  This could be related to granulomatous disease.   Original Report Authenticated By: Marin Roberts, M.D.    Dg Chest 2 View  03/15/2012  *RADIOLOGY REPORT*  Clinical Data: Cough and fever, shortness of breath  CHEST - 2 VIEW  Comparison: 12/28/2011  Findings: Colon projects over the upper abdomen.  Mild moderate enlargement of the cardiomediastinal silhouette is stable.  The lungs are clear.  No pleural effusion.  No acute osseous finding.  IMPRESSION: Mild to moderate cardiomegaly, no acute cardiopulmonary process.   Original Report Authenticated By: Christiana Pellant, M.D.     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 CHF (congestive heart failure)   2. SOB (shortness of breath)   3. Pedal edema    Plan discharge  Devoria Albe, MD, FACEP    MDM   I personally performed the services described in this documentation, which was scribed in my presence. The recorded information has been reviewed and considered.  Devoria Albe, MD, Armando Gang        Ward Givens, MD 03/29/12 [phone]

## 2012-04-01 ENCOUNTER — Telehealth: Payer: Self-pay

## 2012-04-01 NOTE — Telephone Encounter (Signed)
Noted  

## 2012-04-01 NOTE — Telephone Encounter (Signed)
Spoke with physician and she advised that patient should go to the ED for evaluation.

## 2012-04-01 NOTE — Telephone Encounter (Signed)
Received another call from [facility] with [facility] and she states that patient is refusing to go to the ED because he feels "ok".  Advised nurse to tell patient that it is the recommendation of the physician for him to go to the ED.

## 2012-04-01 NOTE — Telephone Encounter (Signed)
Needs to go back to the ED

## 2012-04-02 ENCOUNTER — Other Ambulatory Visit: Payer: Self-pay | Admitting: *Deleted

## 2012-04-02 ENCOUNTER — Ambulatory Visit (INDEPENDENT_AMBULATORY_CARE_PROVIDER_SITE_OTHER): Payer: Medicare Other | Admitting: Family Medicine

## 2012-04-02 ENCOUNTER — Encounter: Payer: Self-pay | Admitting: Family Medicine

## 2012-04-02 VITALS — BP 126/72 | HR 76 | Resp 20 | Ht 67.5 in | Wt 156.1 lb

## 2012-04-02 DIAGNOSIS — G4700 Insomnia, unspecified: Secondary | ICD-10-CM

## 2012-04-02 DIAGNOSIS — I10 Essential (primary) hypertension: Secondary | ICD-10-CM

## 2012-04-02 DIAGNOSIS — J209 Acute bronchitis, unspecified: Secondary | ICD-10-CM

## 2012-04-02 DIAGNOSIS — R972 Elevated prostate specific antigen [PSA]: Secondary | ICD-10-CM

## 2012-04-02 DIAGNOSIS — J441 Chronic obstructive pulmonary disease with (acute) exacerbation: Secondary | ICD-10-CM

## 2012-04-02 DIAGNOSIS — J449 Chronic obstructive pulmonary disease, unspecified: Secondary | ICD-10-CM

## 2012-04-02 MED ORDER — TEMAZEPAM 15 MG PO CAPS: 15.0000 mg | ORAL_CAPSULE | Freq: Every evening | ORAL | Status: DC | PRN

## 2012-04-02 NOTE — Patient Instructions (Addendum)
F/U in 3  Month  Please use Symbicort and spiriva, EVERY day.  Please take your fluid pills when you go home , and remember to eat a low sodium diet , your heart does not work 100%  Use fresh or frozen fruit and vegetable  Sleep pill will be refilled  Congrats on stopping smoking 02/20/2012, please do not start again  Continue to work on urology appointment

## 2012-04-03 ENCOUNTER — Ambulatory Visit (INDEPENDENT_AMBULATORY_CARE_PROVIDER_SITE_OTHER): Payer: Medicare Other | Admitting: Adult Health

## 2012-04-03 ENCOUNTER — Encounter: Payer: Self-pay | Admitting: Adult Health

## 2012-04-03 ENCOUNTER — Ambulatory Visit (INDEPENDENT_AMBULATORY_CARE_PROVIDER_SITE_OTHER): Payer: Medicare Other | Admitting: *Deleted

## 2012-04-03 VITALS — BP 112/67 | HR 77 | Ht 66.0 in | Wt 157.5 lb

## 2012-04-03 DIAGNOSIS — Z7901 Long term (current) use of anticoagulants: Secondary | ICD-10-CM

## 2012-04-03 DIAGNOSIS — I4891 Unspecified atrial fibrillation: Secondary | ICD-10-CM

## 2012-04-03 DIAGNOSIS — I509 Heart failure, unspecified: Secondary | ICD-10-CM

## 2012-04-03 DIAGNOSIS — I10 Essential (primary) hypertension: Secondary | ICD-10-CM

## 2012-04-03 NOTE — Assessment & Plan Note (Signed)
He continues to have evidence of fluid retention with edema in the lower extremities. Dietary and medical compliance issues are main problem for him. I have asked him to increase the lasix to 40 mg BID for 3 days, and weigh himself daily. Reminding him that low sodium diet is imperative. He has been counseled on numerous occasions by PCP and our office on compliance issues. He verbalizes understanding. He is to weigh himself daily and have a BMET in 4 days. He will go back to 40 mg lasix daily after 3 days. He may be a candidate for [facility] if he continues to have recurrence of CHF to avoid frequent ER visits and hospitalization.

## 2012-04-03 NOTE — Assessment & Plan Note (Signed)
Currently well controlled. No other medication changes.

## 2012-04-03 NOTE — Patient Instructions (Addendum)
Your physician recommends that you schedule a follow-up appointment in: 1 week  Your physician recommends that you return for lab work in: Saturday  Your physician has recommended you make the following change in your medication:  Take Lasix 40 mg twice a day, along with potassium for 3 days, then resume your regular dose thereafter

## 2012-04-03 NOTE — Progress Notes (Deleted)
Name: Theodore [name]    DOB: 1940/05/18  Age: 72 y.o.  MR#: [id number]       PCP:  Syliva Overman, MD      Insurance: @PAYORNAME @   CC:   No chief complaint on file.   VS BP 112/67  Pulse 77  Ht 5\' 6"  (1.676 m)  Wt 157 lb 8 oz (71.442 kg)  BMI 25.42 kg/m2  SpO2 94%  Weights Current Weight  04/03/12 157 lb 8 oz (71.442 kg)  04/02/12 156 lb 1.9 oz (70.816 kg)  03/29/12 160 lb (72.576 kg)    Blood Pressure  BP Readings from Last 3 Encounters:  04/03/12 112/67  04/02/12 126/72  03/29/12 146/102     Admit date:  (Not on file) Last encounter with RMR:  Visit date not found   Allergy No Known Allergies  Current Outpatient Prescriptions  Medication Sig Dispense Refill  . albuterol (PROAIR HFA) 108 (90 BASE) MCG/ACT inhaler Inhale 2 puffs into the lungs every 4 (four) hours as needed for wheezing.  8.5 g  3  . albuterol (PROVENTIL) (2.5 MG/3ML) 0.083% nebulizer solution Take 3 mLs (2.5 mg total) by nebulization 3 (three) times daily.  75 mL  1  . brimonidine-timolol (COMBIGAN) 0.2-0.5 % ophthalmic solution Place 1 drop into both eyes every 12 (twelve) hours.        . budesonide-formoterol (SYMBICORT) 160-4.5 MCG/ACT inhaler Inhale 2 puffs into the lungs 2 (two) times daily.  10.2 g  3  . furosemide (LASIX) 40 MG tablet Take 1 tablet (40 mg total) by mouth daily.  60 tablet  3  . lisinopril (PRINIVIL,ZESTRIL) 5 MG tablet Take 1 tablet (5 mg total) by mouth daily.  30 tablet  1  . montelukast (SINGULAIR) 10 MG tablet Take 10 mg by mouth at bedtime.      . potassium chloride (KLOR-CON 10) 10 MEQ tablet Take 1 tablet (10 mEq total) by mouth daily after breakfast.  30 tablet  5  . temazepam (RESTORIL) 15 MG capsule Take 1 capsule (15 mg total) by mouth at bedtime as needed for sleep.  30 capsule  3  . tiotropium (SPIRIVA HANDIHALER) 18 MCG inhalation capsule Place 1 capsule (18 mcg total) into inhaler and inhale daily.  30 capsule  3  . travoprost, benzalkonium, (TRAVATAN Z) 0.004 %  ophthalmic solution Place 1 drop into both eyes at bedtime.       Marland Kitchen warfarin (COUMADIN) 4 MG tablet Take 4-8 mg by mouth daily. Take 2 tablets (8 mg) on Tuesdays and Fridays, then take 1 tablet (4 mg) on Sundays, Mondays,Wednesdays,Thursdays,Saturdays        Discontinued Meds:   There are no discontinued medications.  Patient Active Problem List  Diagnosis  . GOUT  . ANEMIA-NOS  . ERECTILE DYSFUNCTION  . HYPERTENSION  . Atrial fibrillation  . Acute exacerbation of CHF (congestive heart failure)  . ALLERGIC RHINITIS  . CHRONIC OBSTRUCTIVE PULMONARY DISEASE, ACUTE EXACERBATION  . INGUINAL HERNIA, LEFT  . LOW BACK PAIN, ACUTE  . TRIGGER FINGER  . EDEMA LEG  . Encounter for long-term (current) use of other medications  . Special screening for malignant neoplasms, colon  . Need for prophylactic vaccination and inoculation against influenza  . Long term current use of anticoagulant  . Shingles  . Tobacco abuse disorder  . Community acquired pneumonia  . Bronchitis  . Elevated PSA  . COPD (chronic obstructive pulmonary disease)  . Glaucoma  . Hypoxia  . Acute bronchitis  .  Bradycardia    LABS Admission on 03/29/2012, Discharged on 03/29/2012  Component Date Value  . WBC 03/29/2012 11.8*  . RBC 03/29/2012 4.12*  . Hemoglobin 03/29/2012 12.3*  . HCT 03/29/2012 39.2   . MCV 03/29/2012 95.1   . [facility] 03/29/2012 29.9   . MCHC 03/29/2012 31.4   . RDW 03/29/2012 14.2   . Platelets 03/29/2012 221   . Neutrophils Relative 03/29/2012 79*  . Neutro Abs 03/29/2012 9.3*  . Lymphocytes Relative 03/29/2012 7*  . Lymphs Abs 03/29/2012 0.9   . Monocytes Relative 03/29/2012 13*  . Monocytes Absolute 03/29/2012 1.5*  . Eosinophils Relative 03/29/2012 1   . Eosinophils Absolute 03/29/2012 0.1   . Basophils Relative 03/29/2012 0   . Basophils Absolute 03/29/2012 0.0   . Sodium 03/29/2012 143   . Potassium 03/29/2012 4.3   . Chloride 03/29/2012 102   . CO2 03/29/2012 36*  . Glucose,  Bld 03/29/2012 84   . BUN 03/29/2012 28*  . Creatinine, Ser 03/29/2012 0.96   . Calcium 03/29/2012 9.4   . Total Protein 03/29/2012 7.3   . Albumin 03/29/2012 3.5   . AST 03/29/2012 23   . ALT 03/29/2012 26   . Alkaline Phosphatase 03/29/2012 88   . Total Bilirubin 03/29/2012 0.4   . GFR calc non Af Amer 03/29/2012 81*  . GFR calc Af Amer 03/29/2012 >90   . Troponin I 03/29/2012 <0.30   . Pro B Natriuretic peptid* 03/29/2012 3788.0*  . D-Dimer, Quant 03/29/2012 <0.27   Admission on 03/15/2012, Discharged on 03/19/2012  Component Date Value  . WBC 03/15/2012 5.5   . RBC 03/15/2012 4.24   . Hemoglobin 03/15/2012 12.4*  . HCT 03/15/2012 39.9   . MCV 03/15/2012 94.1   . MCH 03/15/2012 29.2   . MCHC 03/15/2012 31.1   . RDW 03/15/2012 13.7   . Platelets 03/15/2012 216   . Neutrophils Relative 03/15/2012 67   . Neutro Abs 03/15/2012 3.7   . Lymphocytes Relative 03/15/2012 10*  . Lymphs Abs 03/15/2012 0.5*  . Monocytes Relative 03/15/2012 22*  . Monocytes Absolute 03/15/2012 1.2*  . Eosinophils Relative 03/15/2012 1   . Eosinophils Absolute 03/15/2012 0.1   . Basophils Relative 03/15/2012 0   . Basophils Absolute 03/15/2012 0.0   . Sodium 03/15/2012 139   . Potassium 03/15/2012 3.7   . Chloride 03/15/2012 97   . CO2 03/15/2012 33*  . Glucose, Bld 03/15/2012 113*  . BUN 03/15/2012 25*  . Creatinine, Ser 03/15/2012 1.07   . Calcium 03/15/2012 8.8   . Total Protein 03/15/2012 7.7   . Albumin 03/15/2012 3.5   . AST 03/15/2012 30   . ALT 03/15/2012 15   . Alkaline Phosphatase 03/15/2012 86   . Total Bilirubin 03/15/2012 0.6   . GFR calc non Af Amer 03/15/2012 67*  . GFR calc Af Amer 03/15/2012 78*  . Troponin I 03/15/2012 <0.30   . Pro B Natriuretic peptid* 03/15/2012 2263.0*  . Color, Urine 03/15/2012 YELLOW   . APPearance 03/15/2012 CLEAR   . Specific Gravity, Urine 03/15/2012 >1.030*  . pH 03/15/2012 5.5   . Glucose, UA 03/15/2012 NEGATIVE   . Hgb urine dipstick  03/15/2012 TRACE*  . Bilirubin Urine 03/15/2012 NEGATIVE   . Ketones, ur 03/15/2012 NEGATIVE   . Protein, ur 03/15/2012 100*  . Urobilinogen, UA 03/15/2012 2.0*  . Nitrite 03/15/2012 NEGATIVE   . Leukocytes, UA 03/15/2012 NEGATIVE   . Prothrombin Time 03/15/2012 15.4*  . INR 03/15/2012 1.24   .  D-Dimer, Quant 03/15/2012 0.42   . Squamous Epithelial / LPF 03/15/2012 RARE   . RBC / HPF 03/15/2012 0-2   . Bacteria, UA 03/15/2012 RARE   . Sodium 03/16/2012 138   . Potassium 03/16/2012 4.0   . Chloride 03/16/2012 97   . CO2 03/16/2012 34*  . Glucose, Bld 03/16/2012 147*  . BUN 03/16/2012 20   . Creatinine, Ser 03/16/2012 0.94   . Calcium 03/16/2012 8.6   . Total Protein 03/16/2012 6.9   . Albumin 03/16/2012 3.0*  . AST 03/16/2012 24   . ALT 03/16/2012 12   . Alkaline Phosphatase 03/16/2012 74   . Total Bilirubin 03/16/2012 0.4   . GFR calc non Af Amer 03/16/2012 82*  . GFR calc Af Amer 03/16/2012 >90   . WBC 03/16/2012 3.3*  . RBC 03/16/2012 3.85*  . Hemoglobin 03/16/2012 11.3*  . HCT 03/16/2012 36.2*  . MCV 03/16/2012 94.0   . [facility] 03/16/2012 29.4   . MCHC 03/16/2012 31.2   . RDW 03/16/2012 13.6   . Platelets 03/16/2012 207   . Prothrombin Time 03/16/2012 15.3*  . INR 03/16/2012 1.23   . Magnesium 03/16/2012 1.8   . TSH 03/16/2012 0.395   . Prothrombin Time 03/17/2012 20.2*  . INR 03/17/2012 1.79*  . Troponin I 03/17/2012 <0.30   . Troponin I 03/17/2012 <0.30   . Troponin I 03/18/2012 <0.30   . Prothrombin Time 03/18/2012 23.9*  . INR 03/18/2012 2.25*  . TSH 03/18/2012 1.179   . Prothrombin Time 03/19/2012 29.5*  . INR 03/19/2012 2.99*  . Sodium 03/19/2012 135   . Potassium 03/19/2012 4.6   . Chloride 03/19/2012 95*  . CO2 03/19/2012 40*  . Glucose, Bld 03/19/2012 83   . BUN 03/19/2012 24*  . Creatinine, Ser 03/19/2012 0.97   . Calcium 03/19/2012 9.2   . GFR calc non Af Amer 03/19/2012 81*  . GFR calc Af Amer 03/19/2012 >90   . WBC 03/19/2012 12.0*  . RBC  03/19/2012 4.00*  . Hemoglobin 03/19/2012 11.6*  . HCT 03/19/2012 37.9*  . MCV 03/19/2012 94.8   . [facility] 03/19/2012 29.0   . MCHC 03/19/2012 30.6   . RDW 03/19/2012 13.2   . Platelets 03/19/2012 216   Anti-coag visit on 01/25/2012  Component Date Value  . INR 01/22/2012 2.0*     Results for this Opt Visit:     Results for orders placed during the hospital encounter of 03/29/12  CBC WITH DIFFERENTIAL      Component Value Range   WBC 11.8 (*) 4.0 - 10.5 K/uL   RBC 4.12 (*) 4.22 - 5.81 MIL/uL   Hemoglobin 12.3 (*) 13.0 - 17.0 g/dL   HCT 40.9  81.1 - 91.4 %   MCV 95.1  78.0 - 100.0 fL   MCH 29.9  26.0 - 34.0 pg   MCHC 31.4  30.0 - 36.0 g/dL   RDW 78.2  95.6 - 21.3 %   Platelets 221  150 - 400 K/uL   Neutrophils Relative 79 (*) 43 - 77 %   Neutro Abs 9.3 (*) 1.7 - 7.7 K/uL   Lymphocytes Relative 7 (*) 12 - 46 %   Lymphs Abs 0.9  0.7 - 4.0 K/uL   Monocytes Relative 13 (*) 3 - 12 %   Monocytes Absolute 1.5 (*) 0.1 - 1.0 K/uL   Eosinophils Relative 1  0 - 5 %   Eosinophils Absolute 0.1  0.0 - 0.7 K/uL   Basophils Relative 0  0 -  1 %   Basophils Absolute 0.0  0.0 - 0.1 K/uL  COMPREHENSIVE METABOLIC PANEL      Component Value Range   Sodium 143  135 - 145 mEq/L   Potassium 4.3  3.5 - 5.1 mEq/L   Chloride 102  96 - 112 mEq/L   CO2 36 (*) 19 - 32 mEq/L   Glucose, Bld 84  70 - 99 mg/dL   BUN 28 (*) 6 - 23 mg/dL   Creatinine, Ser 3.08  0.50 - 1.35 mg/dL   Calcium 9.4  8.4 - 65.7 mg/dL   Total Protein 7.3  6.0 - 8.3 g/dL   Albumin 3.5  3.5 - 5.2 g/dL   AST 23  0 - 37 U/L   ALT 26  0 - 53 U/L   Alkaline Phosphatase 88  39 - 117 U/L   Total Bilirubin 0.4  0.3 - 1.2 mg/dL   GFR calc non Af Amer 81 (*) >90 mL/min   GFR calc Af Amer >90  >90 mL/min  TROPONIN I      Component Value Range   Troponin I <0.30  <0.30 ng/mL  PRO B NATRIURETIC PEPTIDE      Component Value Range   Pro B Natriuretic peptide (BNP) 3788.0 (*) 0 - 125 pg/mL  D-DIMER, QUANTITATIVE      Component Value  Range   D-Dimer, Quant <0.27  0.00 - 0.48 ug/mL-FEU    EKG Orders placed in visit on 04/03/12  . EKG 12-LEAD     Prior Assessment and Plan Problem List as of 04/03/2012          Acute exacerbation of CHF (congestive heart failure)   Last Assessment & Plan Note   04/03/2011 Office Visit Signed 04/03/2011  8:19 PM by Salley Scarlet, MD    Pt improving from recent exacerbation, he was out of his meds at that time. He is to restart Lasix twice a day, potassium will be increased with lasix as he has had muscle cramps in the past    Tobacco abuse disorder   Last Assessment & Plan Note   01/25/2012 Office Visit Signed 01/28/2012  2:25 PM by Kerri Perches, MD    Patient counseled for approximately 5 minutes regarding the health risks of ongoing nicotine use, specifically all types of cancer, heart disease, stroke and respiratory failure. The options available for help with cessation ,the behavioral changes to assist the process, and the option to either gradully reduce usage  Or abruptly stop.is also discussed. Pt is also encouraged to set specific goals in number of cigarettes used daily, as well as to set a quit date.     Glaucoma   GOUT   ANEMIA-NOS   ERECTILE DYSFUNCTION   HYPERTENSION   Last Assessment & Plan Note   01/25/2012 Office Visit Signed 01/28/2012  2:24 PM by Kerri Perches, MD    Controlled, no change in medication DASH diet and commitment to daily physical activity for a minimum of 30 minutes discussed and encouraged, as a part of hypertension management. The importance of attaining a healthy weight is also discussed.     Atrial fibrillation   Last Assessment & Plan Note   01/25/2012 Office Visit Signed 01/28/2012  2:23 PM by Kerri Perches, MD    Currently rate controlled and maintained on coumadin through the clinic    ALLERGIC RHINITIS   CHRONIC OBSTRUCTIVE PULMONARY DISEASE, ACUTE EXACERBATION   Last Assessment & Plan Note   04/03/2011 Office  Visit  Signed 04/03/2011  8:28 PM by Salley Scarlet, MD    Improvement s/p admission and antibiotics. I think his SOB currently is more CHF    INGUINAL HERNIA, LEFT   LOW BACK PAIN, ACUTE   TRIGGER FINGER   EDEMA LEG   Last Assessment & Plan Note   05/11/2011 Office Visit Signed 05/14/2011  7:20 AM by Kerri Perches, MD    Chronic right lower ext edema , anti coagullated, likely due to venous insufficiency, py reports swelling resolves with leg elevation    Encounter for long-term (current) use of other medications   Special screening for malignant neoplasms, colon   Need for prophylactic vaccination and inoculation against influenza   Long term current use of anticoagulant   Shingles   Last Assessment & Plan Note   08/29/2010 Office Visit Signed 09/14/2010  3:15 PM by Syliva Overman, MD    Diagnosed on the day of the visit in the Ed, he is to start medication, and advised not to scratch the rash    Community acquired pneumonia   Bronchitis   Last Assessment & Plan Note   05/11/2011 Office Visit Signed 05/14/2011  7:20 AM by Kerri Perches, MD    Acute symptoms, antibiotic prescribed    Elevated PSA   Last Assessment & Plan Note   01/25/2012 Office Visit Signed 01/28/2012  2:24 PM by Kerri Perches, MD    Urology f/u needs to be pursued, states when he was seen in an office because of lack of co pay, no services were rendered, will contact [facility] and ask to follow up on this    COPD (chronic obstructive pulmonary disease)   Last Assessment & Plan Note   01/25/2012 Office Visit Signed 01/28/2012  2:23 PM by Kerri Perches, MD    Re educated re the need to stop smoking as well as to the correct use of inhalers    Hypoxia   Acute bronchitis   Bradycardia       Imaging: Dg Chest 2 View  03/29/2012  *RADIOLOGY REPORT*  Clinical Data: Shortness of breath.  CHEST - 2 VIEW  Comparison: The two-view chest 03/15/2012.  Findings: The heart is mildly enlarged.  Hilar right  paratracheal prominence is evident.  Question sarcoid.  Emphysematous changes are present.  No focal airspace consolidation is evident. Degenerative changes are noted in the mid thoracic spine.  IMPRESSION:  1.  Mild cardiomegaly without failure. 2.  Mild prominence of the hila and right paratracheal stripe.  The small lymph nodes have been noted before.  This could be related to granulomatous disease.   Original Report Authenticated By: Marin Roberts, M.D.    Dg Chest 2 View  03/15/2012  *RADIOLOGY REPORT*  Clinical Data: Cough and fever, shortness of breath  CHEST - 2 VIEW  Comparison: 12/28/2011  Findings: Colon projects over the upper abdomen.  Mild moderate enlargement of the cardiomediastinal silhouette is stable.  The lungs are clear.  No pleural effusion.  No acute osseous finding.  IMPRESSION: Mild to moderate cardiomegaly, no acute cardiopulmonary process.   Original Report Authenticated By: Christiana Pellant, M.D.      [facility] Calculation: Score not calculated. Missing: Total Cholesterol

## 2012-04-03 NOTE — Progress Notes (Deleted)
Name: Theodore [name]    DOB: 1940-08-09  Age: 72 y.o.  MR#: [id number]       PCP:  Syliva Overman, MD      Insurance: @PAYORNAME @   CC:   No chief complaint on file.   VS BP 112/67  Ht 5\' 6"  (1.676 m)  Wt 157 lb 8 oz (71.442 kg)  BMI 25.42 kg/m2  SpO2 94%  Weights Current Weight  04/03/12 157 lb 8 oz (71.442 kg)  04/02/12 156 lb 1.9 oz (70.816 kg)  03/29/12 160 lb (72.576 kg)    Blood Pressure  BP Readings from Last 3 Encounters:  04/03/12 112/67  04/02/12 126/72  03/29/12 146/102     Admit date:  (Not on file) Last encounter with RMR:  Visit date not found   Allergy No Known Allergies  Current Outpatient Prescriptions  Medication Sig Dispense Refill  . albuterol (PROAIR HFA) 108 (90 BASE) MCG/ACT inhaler Inhale 2 puffs into the lungs every 4 (four) hours as needed for wheezing.  8.5 g  3  . albuterol (PROVENTIL) (2.5 MG/3ML) 0.083% nebulizer solution Take 3 mLs (2.5 mg total) by nebulization 3 (three) times daily.  75 mL  1  . brimonidine-timolol (COMBIGAN) 0.2-0.5 % ophthalmic solution Place 1 drop into both eyes every 12 (twelve) hours.        . budesonide-formoterol (SYMBICORT) 160-4.5 MCG/ACT inhaler Inhale 2 puffs into the lungs 2 (two) times daily.  10.2 g  3  . furosemide (LASIX) 40 MG tablet Take 1 tablet (40 mg total) by mouth daily.  60 tablet  3  . lisinopril (PRINIVIL,ZESTRIL) 5 MG tablet Take 1 tablet (5 mg total) by mouth daily.  30 tablet  1  . montelukast (SINGULAIR) 10 MG tablet Take 10 mg by mouth at bedtime.      . potassium chloride (KLOR-CON 10) 10 MEQ tablet Take 1 tablet (10 mEq total) by mouth daily after breakfast.  30 tablet  5  . temazepam (RESTORIL) 15 MG capsule Take 1 capsule (15 mg total) by mouth at bedtime as needed for sleep.  30 capsule  3  . tiotropium (SPIRIVA HANDIHALER) 18 MCG inhalation capsule Place 1 capsule (18 mcg total) into inhaler and inhale daily.  30 capsule  3  . travoprost, benzalkonium, (TRAVATAN Z) 0.004 % ophthalmic  solution Place 1 drop into both eyes at bedtime.       Marland Kitchen warfarin (COUMADIN) 4 MG tablet Take 4-8 mg by mouth daily. Take 2 tablets (8 mg) on Tuesdays and Fridays, then take 1 tablet (4 mg) on Sundays, Mondays,Wednesdays,Thursdays,Saturdays        Discontinued Meds:   There are no discontinued medications.  Patient Active Problem List  Diagnosis  . GOUT  . ANEMIA-NOS  . ERECTILE DYSFUNCTION  . HYPERTENSION  . Atrial fibrillation  . Acute exacerbation of CHF (congestive heart failure)  . ALLERGIC RHINITIS  . CHRONIC OBSTRUCTIVE PULMONARY DISEASE, ACUTE EXACERBATION  . INGUINAL HERNIA, LEFT  . LOW BACK PAIN, ACUTE  . TRIGGER FINGER  . EDEMA LEG  . Encounter for long-term (current) use of other medications  . Special screening for malignant neoplasms, colon  . Need for prophylactic vaccination and inoculation against influenza  . Long term current use of anticoagulant  . Shingles  . Tobacco abuse disorder  . Community acquired pneumonia  . Bronchitis  . Elevated PSA  . COPD (chronic obstructive pulmonary disease)  . Glaucoma  . Hypoxia  . Acute bronchitis  . Bradycardia  LABS Admission on 03/29/2012, Discharged on 03/29/2012  Component Date Value  . WBC 03/29/2012 11.8*  . RBC 03/29/2012 4.12*  . Hemoglobin 03/29/2012 12.3*  . HCT 03/29/2012 39.2   . MCV 03/29/2012 95.1   . [facility] 03/29/2012 29.9   . MCHC 03/29/2012 31.4   . RDW 03/29/2012 14.2   . Platelets 03/29/2012 221   . Neutrophils Relative 03/29/2012 79*  . Neutro Abs 03/29/2012 9.3*  . Lymphocytes Relative 03/29/2012 7*  . Lymphs Abs 03/29/2012 0.9   . Monocytes Relative 03/29/2012 13*  . Monocytes Absolute 03/29/2012 1.5*  . Eosinophils Relative 03/29/2012 1   . Eosinophils Absolute 03/29/2012 0.1   . Basophils Relative 03/29/2012 0   . Basophils Absolute 03/29/2012 0.0   . Sodium 03/29/2012 143   . Potassium 03/29/2012 4.3   . Chloride 03/29/2012 102   . CO2 03/29/2012 36*  . Glucose, Bld  03/29/2012 84   . BUN 03/29/2012 28*  . Creatinine, Ser 03/29/2012 0.96   . Calcium 03/29/2012 9.4   . Total Protein 03/29/2012 7.3   . Albumin 03/29/2012 3.5   . AST 03/29/2012 23   . ALT 03/29/2012 26   . Alkaline Phosphatase 03/29/2012 88   . Total Bilirubin 03/29/2012 0.4   . GFR calc non Af Amer 03/29/2012 81*  . GFR calc Af Amer 03/29/2012 >90   . Troponin I 03/29/2012 <0.30   . Pro B Natriuretic peptid* 03/29/2012 3788.0*  . D-Dimer, Quant 03/29/2012 <0.27   Admission on 03/15/2012, Discharged on 03/19/2012  Component Date Value  . WBC 03/15/2012 5.5   . RBC 03/15/2012 4.24   . Hemoglobin 03/15/2012 12.4*  . HCT 03/15/2012 39.9   . MCV 03/15/2012 94.1   . MCH 03/15/2012 29.2   . MCHC 03/15/2012 31.1   . RDW 03/15/2012 13.7   . Platelets 03/15/2012 216   . Neutrophils Relative 03/15/2012 67   . Neutro Abs 03/15/2012 3.7   . Lymphocytes Relative 03/15/2012 10*  . Lymphs Abs 03/15/2012 0.5*  . Monocytes Relative 03/15/2012 22*  . Monocytes Absolute 03/15/2012 1.2*  . Eosinophils Relative 03/15/2012 1   . Eosinophils Absolute 03/15/2012 0.1   . Basophils Relative 03/15/2012 0   . Basophils Absolute 03/15/2012 0.0   . Sodium 03/15/2012 139   . Potassium 03/15/2012 3.7   . Chloride 03/15/2012 97   . CO2 03/15/2012 33*  . Glucose, Bld 03/15/2012 113*  . BUN 03/15/2012 25*  . Creatinine, Ser 03/15/2012 1.07   . Calcium 03/15/2012 8.8   . Total Protein 03/15/2012 7.7   . Albumin 03/15/2012 3.5   . AST 03/15/2012 30   . ALT 03/15/2012 15   . Alkaline Phosphatase 03/15/2012 86   . Total Bilirubin 03/15/2012 0.6   . GFR calc non Af Amer 03/15/2012 67*  . GFR calc Af Amer 03/15/2012 78*  . Troponin I 03/15/2012 <0.30   . Pro B Natriuretic peptid* 03/15/2012 2263.0*  . Color, Urine 03/15/2012 YELLOW   . APPearance 03/15/2012 CLEAR   . Specific Gravity, Urine 03/15/2012 >1.030*  . pH 03/15/2012 5.5   . Glucose, UA 03/15/2012 NEGATIVE   . Hgb urine dipstick  03/15/2012 TRACE*  . Bilirubin Urine 03/15/2012 NEGATIVE   . Ketones, ur 03/15/2012 NEGATIVE   . Protein, ur 03/15/2012 100*  . Urobilinogen, UA 03/15/2012 2.0*  . Nitrite 03/15/2012 NEGATIVE   . Leukocytes, UA 03/15/2012 NEGATIVE   . Prothrombin Time 03/15/2012 15.4*  . INR 03/15/2012 1.24   . D-Dimer, Sharene Butters 03/15/2012  0.42   . Squamous Epithelial / LPF 03/15/2012 RARE   . RBC / HPF 03/15/2012 0-2   . Bacteria, UA 03/15/2012 RARE   . Sodium 03/16/2012 138   . Potassium 03/16/2012 4.0   . Chloride 03/16/2012 97   . CO2 03/16/2012 34*  . Glucose, Bld 03/16/2012 147*  . BUN 03/16/2012 20   . Creatinine, Ser 03/16/2012 0.94   . Calcium 03/16/2012 8.6   . Total Protein 03/16/2012 6.9   . Albumin 03/16/2012 3.0*  . AST 03/16/2012 24   . ALT 03/16/2012 12   . Alkaline Phosphatase 03/16/2012 74   . Total Bilirubin 03/16/2012 0.4   . GFR calc non Af Amer 03/16/2012 82*  . GFR calc Af Amer 03/16/2012 >90   . WBC 03/16/2012 3.3*  . RBC 03/16/2012 3.85*  . Hemoglobin 03/16/2012 11.3*  . HCT 03/16/2012 36.2*  . MCV 03/16/2012 94.0   . [facility] 03/16/2012 29.4   . MCHC 03/16/2012 31.2   . RDW 03/16/2012 13.6   . Platelets 03/16/2012 207   . Prothrombin Time 03/16/2012 15.3*  . INR 03/16/2012 1.23   . Magnesium 03/16/2012 1.8   . TSH 03/16/2012 0.395   . Prothrombin Time 03/17/2012 20.2*  . INR 03/17/2012 1.79*  . Troponin I 03/17/2012 <0.30   . Troponin I 03/17/2012 <0.30   . Troponin I 03/18/2012 <0.30   . Prothrombin Time 03/18/2012 23.9*  . INR 03/18/2012 2.25*  . TSH 03/18/2012 1.179   . Prothrombin Time 03/19/2012 29.5*  . INR 03/19/2012 2.99*  . Sodium 03/19/2012 135   . Potassium 03/19/2012 4.6   . Chloride 03/19/2012 95*  . CO2 03/19/2012 40*  . Glucose, Bld 03/19/2012 83   . BUN 03/19/2012 24*  . Creatinine, Ser 03/19/2012 0.97   . Calcium 03/19/2012 9.2   . GFR calc non Af Amer 03/19/2012 81*  . GFR calc Af Amer 03/19/2012 >90   . WBC 03/19/2012 12.0*  . RBC  03/19/2012 4.00*  . Hemoglobin 03/19/2012 11.6*  . HCT 03/19/2012 37.9*  . MCV 03/19/2012 94.8   . [facility] 03/19/2012 29.0   . MCHC 03/19/2012 30.6   . RDW 03/19/2012 13.2   . Platelets 03/19/2012 216   Anti-coag visit on 01/25/2012  Component Date Value  . INR 01/22/2012 2.0*     Results for this Opt Visit:     Results for orders placed during the hospital encounter of 03/29/12  CBC WITH DIFFERENTIAL      Component Value Range   WBC 11.8 (*) 4.0 - 10.5 K/uL   RBC 4.12 (*) 4.22 - 5.81 MIL/uL   Hemoglobin 12.3 (*) 13.0 - 17.0 g/dL   HCT 16.1  09.6 - 04.5 %   MCV 95.1  78.0 - 100.0 fL   MCH 29.9  26.0 - 34.0 pg   MCHC 31.4  30.0 - 36.0 g/dL   RDW 40.9  81.1 - 91.4 %   Platelets 221  150 - 400 K/uL   Neutrophils Relative 79 (*) 43 - 77 %   Neutro Abs 9.3 (*) 1.7 - 7.7 K/uL   Lymphocytes Relative 7 (*) 12 - 46 %   Lymphs Abs 0.9  0.7 - 4.0 K/uL   Monocytes Relative 13 (*) 3 - 12 %   Monocytes Absolute 1.5 (*) 0.1 - 1.0 K/uL   Eosinophils Relative 1  0 - 5 %   Eosinophils Absolute 0.1  0.0 - 0.7 K/uL   Basophils Relative 0  0 - 1 %  Basophils Absolute 0.0  0.0 - 0.1 K/uL  COMPREHENSIVE METABOLIC PANEL      Component Value Range   Sodium 143  135 - 145 mEq/L   Potassium 4.3  3.5 - 5.1 mEq/L   Chloride 102  96 - 112 mEq/L   CO2 36 (*) 19 - 32 mEq/L   Glucose, Bld 84  70 - 99 mg/dL   BUN 28 (*) 6 - 23 mg/dL   Creatinine, Ser 2.13  0.50 - 1.35 mg/dL   Calcium 9.4  8.4 - 08.6 mg/dL   Total Protein 7.3  6.0 - 8.3 g/dL   Albumin 3.5  3.5 - 5.2 g/dL   AST 23  0 - 37 U/L   ALT 26  0 - 53 U/L   Alkaline Phosphatase 88  39 - 117 U/L   Total Bilirubin 0.4  0.3 - 1.2 mg/dL   GFR calc non Af Amer 81 (*) >90 mL/min   GFR calc Af Amer >90  >90 mL/min  TROPONIN I      Component Value Range   Troponin I <0.30  <0.30 ng/mL  PRO B NATRIURETIC PEPTIDE      Component Value Range   Pro B Natriuretic peptide (BNP) 3788.0 (*) 0 - 125 pg/mL  D-DIMER, QUANTITATIVE      Component Value  Range   D-Dimer, Quant <0.27  0.00 - 0.48 ug/mL-FEU    EKG Orders placed in visit on 04/03/12  . EKG 12-LEAD     Prior Assessment and Plan Problem List as of 04/03/2012          Acute exacerbation of CHF (congestive heart failure)   Last Assessment & Plan Note   04/03/2011 Office Visit Signed 04/03/2011  8:19 PM by Salley Scarlet, MD    Pt improving from recent exacerbation, he was out of his meds at that time. He is to restart Lasix twice a day, potassium will be increased with lasix as he has had muscle cramps in the past    Tobacco abuse disorder   Last Assessment & Plan Note   01/25/2012 Office Visit Signed 01/28/2012  2:25 PM by Kerri Perches, MD    Patient counseled for approximately 5 minutes regarding the health risks of ongoing nicotine use, specifically all types of cancer, heart disease, stroke and respiratory failure. The options available for help with cessation ,the behavioral changes to assist the process, and the option to either gradully reduce usage  Or abruptly stop.is also discussed. Pt is also encouraged to set specific goals in number of cigarettes used daily, as well as to set a quit date.     Glaucoma   GOUT   ANEMIA-NOS   ERECTILE DYSFUNCTION   HYPERTENSION   Last Assessment & Plan Note   01/25/2012 Office Visit Signed 01/28/2012  2:24 PM by Kerri Perches, MD    Controlled, no change in medication DASH diet and commitment to daily physical activity for a minimum of 30 minutes discussed and encouraged, as a part of hypertension management. The importance of attaining a healthy weight is also discussed.     Atrial fibrillation   Last Assessment & Plan Note   01/25/2012 Office Visit Signed 01/28/2012  2:23 PM by Kerri Perches, MD    Currently rate controlled and maintained on coumadin through the clinic    ALLERGIC RHINITIS   CHRONIC OBSTRUCTIVE PULMONARY DISEASE, ACUTE EXACERBATION   Last Assessment & Plan Note   04/03/2011 Office Visit  Signed 04/03/2011  8:28 PM by Salley Scarlet, MD    Improvement s/p admission and antibiotics. I think his SOB currently is more CHF    INGUINAL HERNIA, LEFT   LOW BACK PAIN, ACUTE   TRIGGER FINGER   EDEMA LEG   Last Assessment & Plan Note   05/11/2011 Office Visit Signed 05/14/2011  7:20 AM by Kerri Perches, MD    Chronic right lower ext edema , anti coagullated, likely due to venous insufficiency, py reports swelling resolves with leg elevation    Encounter for long-term (current) use of other medications   Special screening for malignant neoplasms, colon   Need for prophylactic vaccination and inoculation against influenza   Long term current use of anticoagulant   Shingles   Last Assessment & Plan Note   08/29/2010 Office Visit Signed 09/14/2010  3:15 PM by Syliva Overman, MD    Diagnosed on the day of the visit in the Ed, he is to start medication, and advised not to scratch the rash    Community acquired pneumonia   Bronchitis   Last Assessment & Plan Note   05/11/2011 Office Visit Signed 05/14/2011  7:20 AM by Kerri Perches, MD    Acute symptoms, antibiotic prescribed    Elevated PSA   Last Assessment & Plan Note   01/25/2012 Office Visit Signed 01/28/2012  2:24 PM by Kerri Perches, MD    Urology f/u needs to be pursued, states when he was seen in an office because of lack of co pay, no services were rendered, will contact [facility] and ask to follow up on this    COPD (chronic obstructive pulmonary disease)   Last Assessment & Plan Note   01/25/2012 Office Visit Signed 01/28/2012  2:23 PM by Kerri Perches, MD    Re educated re the need to stop smoking as well as to the correct use of inhalers    Hypoxia   Acute bronchitis   Bradycardia       Imaging: Dg Chest 2 View  03/29/2012  *RADIOLOGY REPORT*  Clinical Data: Shortness of breath.  CHEST - 2 VIEW  Comparison: The two-view chest 03/15/2012.  Findings: The heart is mildly enlarged.  Hilar right  paratracheal prominence is evident.  Question sarcoid.  Emphysematous changes are present.  No focal airspace consolidation is evident. Degenerative changes are noted in the mid thoracic spine.  IMPRESSION:  1.  Mild cardiomegaly without failure. 2.  Mild prominence of the hila and right paratracheal stripe.  The small lymph nodes have been noted before.  This could be related to granulomatous disease.   Original Report Authenticated By: Marin Roberts, M.D.    Dg Chest 2 View  03/15/2012  *RADIOLOGY REPORT*  Clinical Data: Cough and fever, shortness of breath  CHEST - 2 VIEW  Comparison: 12/28/2011  Findings: Colon projects over the upper abdomen.  Mild moderate enlargement of the cardiomediastinal silhouette is stable.  The lungs are clear.  No pleural effusion.  No acute osseous finding.  IMPRESSION: Mild to moderate cardiomegaly, no acute cardiopulmonary process.   Original Report Authenticated By: Christiana Pellant, M.D.      [facility] Calculation: Score not calculated. Missing: Total Cholesterol

## 2012-04-03 NOTE — Progress Notes (Signed)
HPI: [name] is a 72 y/o patient of Dr.Rothbart, but has been seen by Dr. Eden Emms and Dr. Daleen Squibb as well who has been lost to follow up for one year with known history of hypertension, atrial fibrillation on chronic coumadin (lost to follow up for dosing since Nov of 2013), diastolic CHF with preserved systolic function per echo on 03/18/08, COPD and other chronic medical issues. He was seen in the ER on 03/29/2012  for LEE and dyspnea. He was treated with IV lasix 60 mg, albuterol and atrovent treatment with NTG. He diuresed, felt better and was released. He has since been taking lasix 40 mg once a day with potassium. He complains that potassium causes him to have leg cramps.  He is not adhering to low sodium diet. His wt is down 3 lbs from ER visit. He is without complaints today.  Marland Kitchen No Known Allergies  Current Outpatient Prescriptions  Medication Sig Dispense Refill  . albuterol (PROAIR HFA) 108 (90 BASE) MCG/ACT inhaler Inhale 2 puffs into the lungs every 4 (four) hours as needed for wheezing.  8.5 g  3  . albuterol (PROVENTIL) (2.5 MG/3ML) 0.083% nebulizer solution Take 3 mLs (2.5 mg total) by nebulization 3 (three) times daily.  75 mL  1  . brimonidine-timolol (COMBIGAN) 0.2-0.5 % ophthalmic solution Place 1 drop into both eyes every 12 (twelve) hours.        . budesonide-formoterol (SYMBICORT) 160-4.5 MCG/ACT inhaler Inhale 2 puffs into the lungs 2 (two) times daily.  10.2 g  3  . furosemide (LASIX) 40 MG tablet Take 1 tablet (40 mg total) by mouth daily.  60 tablet  3  . lisinopril (PRINIVIL,ZESTRIL) 5 MG tablet Take 1 tablet (5 mg total) by mouth daily.  30 tablet  1  . montelukast (SINGULAIR) 10 MG tablet Take 10 mg by mouth at bedtime.      . potassium chloride (KLOR-CON 10) 10 MEQ tablet Take 1 tablet (10 mEq total) by mouth daily after breakfast.  30 tablet  5  . temazepam (RESTORIL) 15 MG capsule Take 1 capsule (15 mg total) by mouth at bedtime as needed for sleep.  30 capsule  3  .  tiotropium (SPIRIVA HANDIHALER) 18 MCG inhalation capsule Place 1 capsule (18 mcg total) into inhaler and inhale daily.  30 capsule  3  . travoprost, benzalkonium, (TRAVATAN Z) 0.004 % ophthalmic solution Place 1 drop into both eyes at bedtime.       Marland Kitchen warfarin (COUMADIN) 4 MG tablet Take 4-8 mg by mouth daily. Take 2 tablets (8 mg) on Tuesdays and Fridays, then take 1 tablet (4 mg) on Sundays, Mondays,Wednesdays,Thursdays,Saturdays        Past Medical History  Diagnosis Date  . Anemia     Hemoglobin 11.6 in 04/2008->resolved   . Edema     Lower Extremity  . Tobacco abuse   . Glaucoma(365)   . Allergic rhinitis   . Atrial fibrillation     Coumadin;asymptomatic bradycardia on telemetry in 12/2009  . CHF (congestive heart failure)     H/o CHF with preserved LV EF; pulmonary hypertension; normal EF in 03/2009  . Hypertension   . Shingles   . Shortness of breath   . COPD (chronic obstructive pulmonary disease)   . Asthma     Past Surgical History  Procedure Date  . Bilateral cataract surgery   . Herniorrhapy   . Tendon repair     right hand surgical procedure for a tendon repair  ROS: Review of systems complete and found to be negative unless listed above  PHYSICAL EXAM BP 112/67  Pulse 77  Ht 5\' 6"  (1.676 m)  Wt 157 lb 8 oz (71.442 kg)  BMI 25.42 kg/m2  SpO2 94%  General: Well developed, well nourished, in no acute distress Head: Eyes PERRLA, No xanthomas.   Normal cephalic and atramatic  Lungs: Bilateral crackles in the bases. Heart: HRIR S1 S2, without MRG.  Pulses are 2+ & equal.            No carotid bruit. No JVD.  No abdominal bruits. No femoral bruits. Abdomen: Bowel sounds are positive, abdomen soft and non-tender without masses or                  Hernia's noted. Msk:  Back normal, normal gait. Normal strength and tone for age. Extremities: No clubbing, cyanosis, 2+ pitting edema.  DP +1 Neuro: Alert and oriented X 3. Psych:  Good affect, responds  appropriately  EKG: Atrial fibrillation rate of 89 bpm with left axis deviation.  ASSESSMENT AND PLAN

## 2012-04-11 ENCOUNTER — Telehealth: Payer: Self-pay | Admitting: *Deleted

## 2012-04-11 ENCOUNTER — Encounter: Payer: Self-pay | Admitting: Adult Health

## 2012-04-11 ENCOUNTER — Ambulatory Visit (INDEPENDENT_AMBULATORY_CARE_PROVIDER_SITE_OTHER): Payer: Medicare Other | Admitting: Adult Health

## 2012-04-11 VITALS — BP 108/70 | HR 60 | Ht 66.0 in | Wt 155.0 lb

## 2012-04-11 DIAGNOSIS — I509 Heart failure, unspecified: Secondary | ICD-10-CM

## 2012-04-11 DIAGNOSIS — Z720 Tobacco use: Secondary | ICD-10-CM

## 2012-04-11 DIAGNOSIS — I10 Essential (primary) hypertension: Secondary | ICD-10-CM

## 2012-04-11 DIAGNOSIS — I4891 Unspecified atrial fibrillation: Secondary | ICD-10-CM

## 2012-04-11 DIAGNOSIS — I5030 Unspecified diastolic (congestive) heart failure: Secondary | ICD-10-CM

## 2012-04-11 DIAGNOSIS — F17200 Nicotine dependence, unspecified, uncomplicated: Secondary | ICD-10-CM

## 2012-04-11 NOTE — Assessment & Plan Note (Signed)
Heart rate is well controled. He is complaint with medications. He will continue in our coumadin clinic for ongoing dosing.

## 2012-04-11 NOTE — Progress Notes (Signed)
HPI: Theodore [name] is a  72 y/o patient of Theodore [name], but has been seen by many different cardiologists in the practice due to follow up issues. We see him for ongoing assessment and treatment of atrial fibrillation, hypertension, and diastolic CHF with preserved LV fx. He was last seen in our office on 04/03/12 after ER visit for extensive LEE due to medical and dietary noncompliance. On last visit, I increased his lasix dose temporarily to 40 mg BID for 3 days, with close follow up today. I am pleased that he came back. He has weighed himself daily as requested and has recorded it along with with BP. He states he is feeling and breathing better. According to his scales, he has diuresed 5 lbs, our scales weigh him down 3 lbs from last visit. He working on keeping away from [employer], but continues to struggle with this due to low income and availability of appropriate foods. He denies medical noncompliance. He has lost a total of 6 lbs since initial ER visit one week ago.   No Known Allergies  Current Outpatient Prescriptions  Medication Sig Dispense Refill  . albuterol (PROAIR HFA) 108 (90 BASE) MCG/ACT inhaler Inhale 2 puffs into the lungs every 4 (four) hours as needed for wheezing.  8.5 g  3  . albuterol (PROVENTIL) (2.5 MG/3ML) 0.083% nebulizer solution Take 3 mLs (2.5 mg total) by nebulization 3 (three) times daily.  75 mL  1  . brimonidine-timolol (COMBIGAN) 0.2-0.5 % ophthalmic solution Place 1 drop into both eyes every 12 (twelve) hours.        . budesonide-formoterol (SYMBICORT) 160-4.5 MCG/ACT inhaler Inhale 2 puffs into the lungs 2 (two) times daily.  10.2 g  3  . furosemide (LASIX) 40 MG tablet Take 1 tablet (40 mg total) by mouth daily.  60 tablet  3  . lisinopril (PRINIVIL,ZESTRIL) 5 MG tablet Take 1 tablet (5 mg total) by mouth daily.  30 tablet  1  . montelukast (SINGULAIR) 10 MG tablet Take 10 mg by mouth at bedtime.      . potassium chloride (KLOR-CON 10) 10 MEQ tablet Take 1  tablet (10 mEq total) by mouth daily after breakfast.  30 tablet  5  . temazepam (RESTORIL) 15 MG capsule Take 1 capsule (15 mg total) by mouth at bedtime as needed for sleep.  30 capsule  3  . tiotropium (SPIRIVA HANDIHALER) 18 MCG inhalation capsule Place 1 capsule (18 mcg total) into inhaler and inhale daily.  30 capsule  3  . travoprost, benzalkonium, (TRAVATAN Z) 0.004 % ophthalmic solution Place 1 drop into both eyes at bedtime.       Marland Kitchen warfarin (COUMADIN) 4 MG tablet Take 4-8 mg by mouth daily. Take 2 tablets (8 mg) on Tuesdays and Fridays, then take 1 tablet (4 mg) on Sundays, Mondays,Wednesdays,Thursdays,Saturdays        Past Medical History  Diagnosis Date  . Anemia     Hemoglobin 11.6 in 04/2008->resolved   . Edema     Lower Extremity  . Tobacco abuse   . Glaucoma(365)   . Allergic rhinitis   . Atrial fibrillation     Coumadin;asymptomatic bradycardia on telemetry in 12/2009  . CHF (congestive heart failure)     H/o CHF with preserved LV EF; pulmonary hypertension; normal EF in 03/2009  . Hypertension   . Shingles   . Shortness of breath   . COPD (chronic obstructive pulmonary disease)   . Asthma     Past  Surgical History  Procedure Date  . Bilateral cataract surgery   . Herniorrhapy   . Tendon repair     right hand surgical procedure for a tendon repair    [id number] of systems complete and found to be negative unless listed above  PHYSICAL EXAM BP 108/70  Pulse 60  Ht 5\' 6"  (1.676 m)  Wt 155 lb (70.308 kg)  BMI 25.02 kg/m2  General: Well developed, well nourished, in no acute distress Head: Eyes PERRLA, No xanthomas.   Normal cephalic and atramatic  Lungs: Clear bilaterally in the upper lobes but crackles remain in the bases. Marland Kitchen Heart: HRIR S1 S2, without MRG.  Pulses are 2+ & equal.            No carotid bruit. No JVD.  No abdominal bruits. No femoral bruits. Abdomen: Bowel sounds are positive, abdomen soft and non-tender without masses or                   Hernia's noted. Msk:  Back normal, normal gait. Normal strength and tone for age. Extremities: No clubbing, cyanosis , mild non-pitting edema above the sock line.  DP 1/2.  Neuro: Alert and oriented X 3. Psych:  Good affect, responds appropriately  ASSESSMENT AND PLAN

## 2012-04-11 NOTE — Assessment & Plan Note (Signed)
He is cutting down on smoking, but has not completely quit yet.

## 2012-04-11 NOTE — Progress Notes (Deleted)
Name: Theodore [name]    DOB: 09/11/1940  Age: 72 y.o.  MR#: [id number]       PCP:  Syliva Overman, MD      Insurance: @PAYORNAME @   CC:   No chief complaint on file.   VS BP 108/70  Pulse 60  Ht 5\' 6"  (1.676 m)  Wt 155 lb (70.308 kg)  BMI 25.02 kg/m2  Weights Current Weight  04/11/12 155 lb (70.308 kg)  04/03/12 157 lb 8 oz (71.442 kg)  04/02/12 156 lb 1.9 oz (70.816 kg)    Blood Pressure  BP Readings from Last 3 Encounters:  04/11/12 108/70  04/03/12 112/67  04/02/12 126/72     Admit date:  (Not on file) Last encounter with RMR:  04/03/2012   Allergy No Known Allergies  Current Outpatient Prescriptions  Medication Sig Dispense Refill  . albuterol (PROAIR HFA) 108 (90 BASE) MCG/ACT inhaler Inhale 2 puffs into the lungs every 4 (four) hours as needed for wheezing.  8.5 g  3  . albuterol (PROVENTIL) (2.5 MG/3ML) 0.083% nebulizer solution Take 3 mLs (2.5 mg total) by nebulization 3 (three) times daily.  75 mL  1  . brimonidine-timolol (COMBIGAN) 0.2-0.5 % ophthalmic solution Place 1 drop into both eyes every 12 (twelve) hours.        . budesonide-formoterol (SYMBICORT) 160-4.5 MCG/ACT inhaler Inhale 2 puffs into the lungs 2 (two) times daily.  10.2 g  3  . furosemide (LASIX) 40 MG tablet Take 1 tablet (40 mg total) by mouth daily.  60 tablet  3  . lisinopril (PRINIVIL,ZESTRIL) 5 MG tablet Take 1 tablet (5 mg total) by mouth daily.  30 tablet  1  . montelukast (SINGULAIR) 10 MG tablet Take 10 mg by mouth at bedtime.      . potassium chloride (KLOR-CON 10) 10 MEQ tablet Take 1 tablet (10 mEq total) by mouth daily after breakfast.  30 tablet  5  . temazepam (RESTORIL) 15 MG capsule Take 1 capsule (15 mg total) by mouth at bedtime as needed for sleep.  30 capsule  3  . tiotropium (SPIRIVA HANDIHALER) 18 MCG inhalation capsule Place 1 capsule (18 mcg total) into inhaler and inhale daily.  30 capsule  3  . travoprost, benzalkonium, (TRAVATAN Z) 0.004 % ophthalmic solution Place 1  drop into both eyes at bedtime.       Marland Kitchen warfarin (COUMADIN) 4 MG tablet Take 4-8 mg by mouth daily. Take 2 tablets (8 mg) on Tuesdays and Fridays, then take 1 tablet (4 mg) on Sundays, Mondays,Wednesdays,Thursdays,Saturdays        Discontinued Meds:   There are no discontinued medications.  Patient Active Problem List  Diagnosis  . GOUT  . ANEMIA-NOS  . ERECTILE DYSFUNCTION  . HYPERTENSION  . Atrial fibrillation  . Acute exacerbation of CHF (congestive heart failure)  . ALLERGIC RHINITIS  . CHRONIC OBSTRUCTIVE PULMONARY DISEASE, ACUTE EXACERBATION  . INGUINAL HERNIA, LEFT  . LOW BACK PAIN, ACUTE  . TRIGGER FINGER  . EDEMA LEG  . Encounter for long-term (current) use of other medications  . Special screening for malignant neoplasms, colon  . Need for prophylactic vaccination and inoculation against influenza  . Long term current use of anticoagulant  . Shingles  . Tobacco abuse disorder  . Community acquired pneumonia  . Bronchitis  . Elevated PSA  . COPD (chronic obstructive pulmonary disease)  . Glaucoma  . Hypoxia  . Acute bronchitis  . Bradycardia    LABS Anti-coag visit  on 04/03/2012  Component Date Value  . INR 04/03/2012 1.5   Admission on 03/29/2012, Discharged on 03/29/2012  Component Date Value  . WBC 03/29/2012 11.8*  . RBC 03/29/2012 4.12*  . Hemoglobin 03/29/2012 12.3*  . HCT 03/29/2012 39.2   . MCV 03/29/2012 95.1   . [facility] 03/29/2012 29.9   . MCHC 03/29/2012 31.4   . RDW 03/29/2012 14.2   . Platelets 03/29/2012 221   . Neutrophils Relative 03/29/2012 79*  . Neutro Abs 03/29/2012 9.3*  . Lymphocytes Relative 03/29/2012 7*  . Lymphs Abs 03/29/2012 0.9   . Monocytes Relative 03/29/2012 13*  . Monocytes Absolute 03/29/2012 1.5*  . Eosinophils Relative 03/29/2012 1   . Eosinophils Absolute 03/29/2012 0.1   . Basophils Relative 03/29/2012 0   . Basophils Absolute 03/29/2012 0.0   . Sodium 03/29/2012 143   . Potassium 03/29/2012 4.3   . Chloride  03/29/2012 102   . CO2 03/29/2012 36*  . Glucose, Bld 03/29/2012 84   . BUN 03/29/2012 28*  . Creatinine, Ser 03/29/2012 0.96   . Calcium 03/29/2012 9.4   . Total Protein 03/29/2012 7.3   . Albumin 03/29/2012 3.5   . AST 03/29/2012 23   . ALT 03/29/2012 26   . Alkaline Phosphatase 03/29/2012 88   . Total Bilirubin 03/29/2012 0.4   . GFR calc non Af Amer 03/29/2012 81*  . GFR calc Af Amer 03/29/2012 >90   . Troponin I 03/29/2012 <0.30   . Pro B Natriuretic peptid* 03/29/2012 3788.0*  . D-Dimer, Quant 03/29/2012 <0.27   Admission on 03/15/2012, Discharged on 03/19/2012  Component Date Value  . WBC 03/15/2012 5.5   . RBC 03/15/2012 4.24   . Hemoglobin 03/15/2012 12.4*  . HCT 03/15/2012 39.9   . MCV 03/15/2012 94.1   . MCH 03/15/2012 29.2   . MCHC 03/15/2012 31.1   . RDW 03/15/2012 13.7   . Platelets 03/15/2012 216   . Neutrophils Relative 03/15/2012 67   . Neutro Abs 03/15/2012 3.7   . Lymphocytes Relative 03/15/2012 10*  . Lymphs Abs 03/15/2012 0.5*  . Monocytes Relative 03/15/2012 22*  . Monocytes Absolute 03/15/2012 1.2*  . Eosinophils Relative 03/15/2012 1   . Eosinophils Absolute 03/15/2012 0.1   . Basophils Relative 03/15/2012 0   . Basophils Absolute 03/15/2012 0.0   . Sodium 03/15/2012 139   . Potassium 03/15/2012 3.7   . Chloride 03/15/2012 97   . CO2 03/15/2012 33*  . Glucose, Bld 03/15/2012 113*  . BUN 03/15/2012 25*  . Creatinine, Ser 03/15/2012 1.07   . Calcium 03/15/2012 8.8   . Total Protein 03/15/2012 7.7   . Albumin 03/15/2012 3.5   . AST 03/15/2012 30   . ALT 03/15/2012 15   . Alkaline Phosphatase 03/15/2012 86   . Total Bilirubin 03/15/2012 0.6   . GFR calc non Af Amer 03/15/2012 67*  . GFR calc Af Amer 03/15/2012 78*  . Troponin I 03/15/2012 <0.30   . Pro B Natriuretic peptid* 03/15/2012 2263.0*  . Color, Urine 03/15/2012 YELLOW   . APPearance 03/15/2012 CLEAR   . Specific Gravity, Urine 03/15/2012 >1.030*  . pH 03/15/2012 5.5   . Glucose,  UA 03/15/2012 NEGATIVE   . Hgb urine dipstick 03/15/2012 TRACE*  . Bilirubin Urine 03/15/2012 NEGATIVE   . Ketones, ur 03/15/2012 NEGATIVE   . Protein, ur 03/15/2012 100*  . Urobilinogen, UA 03/15/2012 2.0*  . Nitrite 03/15/2012 NEGATIVE   . Leukocytes, UA 03/15/2012 NEGATIVE   . Prothrombin Time 03/15/2012  15.4*  . INR 03/15/2012 1.24   . D-Dimer, Quant 03/15/2012 0.42   . Squamous Epithelial / LPF 03/15/2012 RARE   . RBC / HPF 03/15/2012 0-2   . Bacteria, UA 03/15/2012 RARE   . Sodium 03/16/2012 138   . Potassium 03/16/2012 4.0   . Chloride 03/16/2012 97   . CO2 03/16/2012 34*  . Glucose, Bld 03/16/2012 147*  . BUN 03/16/2012 20   . Creatinine, Ser 03/16/2012 0.94   . Calcium 03/16/2012 8.6   . Total Protein 03/16/2012 6.9   . Albumin 03/16/2012 3.0*  . AST 03/16/2012 24   . ALT 03/16/2012 12   . Alkaline Phosphatase 03/16/2012 74   . Total Bilirubin 03/16/2012 0.4   . GFR calc non Af Amer 03/16/2012 82*  . GFR calc Af Amer 03/16/2012 >90   . WBC 03/16/2012 3.3*  . RBC 03/16/2012 3.85*  . Hemoglobin 03/16/2012 11.3*  . HCT 03/16/2012 36.2*  . MCV 03/16/2012 94.0   . [facility] 03/16/2012 29.4   . MCHC 03/16/2012 31.2   . RDW 03/16/2012 13.6   . Platelets 03/16/2012 207   . Prothrombin Time 03/16/2012 15.3*  . INR 03/16/2012 1.23   . Magnesium 03/16/2012 1.8   . TSH 03/16/2012 0.395   . Prothrombin Time 03/17/2012 20.2*  . INR 03/17/2012 1.79*  . Troponin I 03/17/2012 <0.30   . Troponin I 03/17/2012 <0.30   . Troponin I 03/18/2012 <0.30   . Prothrombin Time 03/18/2012 23.9*  . INR 03/18/2012 2.25*  . TSH 03/18/2012 1.179   . Prothrombin Time 03/19/2012 29.5*  . INR 03/19/2012 2.99*  . Sodium 03/19/2012 135   . Potassium 03/19/2012 4.6   . Chloride 03/19/2012 95*  . CO2 03/19/2012 40*  . Glucose, Bld 03/19/2012 83   . BUN 03/19/2012 24*  . Creatinine, Ser 03/19/2012 0.97   . Calcium 03/19/2012 9.2   . GFR calc non Af Amer 03/19/2012 81*  . GFR calc Af Amer  03/19/2012 >90   . WBC 03/19/2012 12.0*  . RBC 03/19/2012 4.00*  . Hemoglobin 03/19/2012 11.6*  . HCT 03/19/2012 37.9*  . MCV 03/19/2012 94.8   . [facility] 03/19/2012 29.0   . MCHC 03/19/2012 30.6   . RDW 03/19/2012 13.2   . Platelets 03/19/2012 216   Anti-coag visit on 01/25/2012  Component Date Value  . INR 01/22/2012 2.0*     Results for this Opt Visit:     Results for orders placed in visit on 04/03/12  POCT INR      Component Value Range   INR 1.5      EKG Orders placed in visit on 04/03/12  . EKG 12-LEAD     Prior Assessment and Plan Problem List as of 04/11/2012          Acute exacerbation of CHF (congestive heart failure)   Last Assessment & Plan Note   04/03/2012 Office Visit Signed 04/03/2012  3:18 PM by Jodelle Gross, NP    He continues to have evidence of fluid retention with edema in the lower extremities. Dietary and medical compliance issues are main problem for him. I have asked him to increase the lasix to 40 mg BID for 3 days, and weigh himself daily. Reminding him that low sodium diet is imperative. He has been counseled on numerous occasions by PCP and our office on compliance issues. He verbalizes understanding. He is to weigh himself daily and have a BMET in 4 days. He will go back to 40  mg lasix daily after 3 days. He may be a candidate for [facility] if he continues to have recurrence of CHF to avoid frequent ER visits and hospitalization.     Tobacco abuse disorder   Last Assessment & Plan Note   01/25/2012 Office Visit Signed 01/28/2012  2:25 PM by Kerri Perches, MD    Patient counseled for approximately 5 minutes regarding the health risks of ongoing nicotine use, specifically all types of cancer, heart disease, stroke and respiratory failure. The options available for help with cessation ,the behavioral changes to assist the process, and the option to either gradully reduce usage  Or abruptly stop.is also discussed. Pt is also encouraged to set specific  goals in number of cigarettes used daily, as well as to set a quit date.     Glaucoma   GOUT   ANEMIA-NOS   ERECTILE DYSFUNCTION   HYPERTENSION   Last Assessment & Plan Note   04/03/2012 Office Visit Signed 04/03/2012  3:18 PM by Jodelle Gross, NP    Currently well controlled. No other medication changes.    Atrial fibrillation   Last Assessment & Plan Note   01/25/2012 Office Visit Signed 01/28/2012  2:23 PM by Kerri Perches, MD    Currently rate controlled and maintained on coumadin through the clinic    ALLERGIC RHINITIS   CHRONIC OBSTRUCTIVE PULMONARY DISEASE, ACUTE EXACERBATION   Last Assessment & Plan Note   04/03/2011 Office Visit Signed 04/03/2011  8:28 PM by Salley Scarlet, MD    Improvement s/p admission and antibiotics. I think his SOB currently is more CHF    INGUINAL HERNIA, LEFT   LOW BACK PAIN, ACUTE   TRIGGER FINGER   EDEMA LEG   Last Assessment & Plan Note   05/11/2011 Office Visit Signed 05/14/2011  7:20 AM by Kerri Perches, MD    Chronic right lower ext edema , anti coagullated, likely due to venous insufficiency, py reports swelling resolves with leg elevation    Encounter for long-term (current) use of other medications   Special screening for malignant neoplasms, colon   Need for prophylactic vaccination and inoculation against influenza   Long term current use of anticoagulant   Shingles   Last Assessment & Plan Note   08/29/2010 Office Visit Signed 09/14/2010  3:15 PM by Syliva Overman, MD    Diagnosed on the day of the visit in the Ed, he is to start medication, and advised not to scratch the rash    Community acquired pneumonia   Bronchitis   Last Assessment & Plan Note   05/11/2011 Office Visit Signed 05/14/2011  7:20 AM by Kerri Perches, MD    Acute symptoms, antibiotic prescribed    Elevated PSA   Last Assessment & Plan Note   01/25/2012 Office Visit Signed 01/28/2012  2:24 PM by Kerri Perches, MD    Urology f/u needs to be  pursued, states when he was seen in an office because of lack of co pay, no services were rendered, will contact [facility] and ask to follow up on this    COPD (chronic obstructive pulmonary disease)   Last Assessment & Plan Note   01/25/2012 Office Visit Signed 01/28/2012  2:23 PM by Kerri Perches, MD    Re educated re the need to stop smoking as well as to the correct use of inhalers    Hypoxia   Acute bronchitis   Bradycardia       Imaging: Dg  Chest 2 View  03/29/2012  *RADIOLOGY REPORT*  Clinical Data: Shortness of breath.  CHEST - 2 VIEW  Comparison: The two-view chest 03/15/2012.  Findings: The heart is mildly enlarged.  Hilar right paratracheal prominence is evident.  Question sarcoid.  Emphysematous changes are present.  No focal airspace consolidation is evident. Degenerative changes are noted in the mid thoracic spine.  IMPRESSION:  1.  Mild cardiomegaly without failure. 2.  Mild prominence of the hila and right paratracheal stripe.  The small lymph nodes have been noted before.  This could be related to granulomatous disease.   Original Report Authenticated By: Marin Roberts, M.D.    Dg Chest 2 View  03/15/2012  *RADIOLOGY REPORT*  Clinical Data: Cough and fever, shortness of breath  CHEST - 2 VIEW  Comparison: 12/28/2011  Findings: Colon projects over the upper abdomen.  Mild moderate enlargement of the cardiomediastinal silhouette is stable.  The lungs are clear.  No pleural effusion.  No acute osseous finding.  IMPRESSION: Mild to moderate cardiomegaly, no acute cardiopulmonary process.   Original Report Authenticated By: Christiana Pellant, M.D.      [facility] Calculation: Score not calculated. Missing: Total Cholesterol

## 2012-04-11 NOTE — Patient Instructions (Addendum)
Your physician recommends that you schedule a follow-up appointment in: ONE MONTH  KEEP YOUR UPCOMING APT WITH [facility] ([facility]) 04-17-12  YOUR PHYSICIAN RECOMMENDS THAT YOU RECEIVE A HOME VISIT FROM [facility], SOMEONE WILL CONTACT YOU ABOUT A DATE/TIME THEY CAN COME OUT TO YOUR HOME TO EVALUATE  Your physician recommends that you return for lab work in: TODAY (BMET) SLIPS GIVEN .  YOUR PHYSICIAN RECOMMENDS THAT YOU WEIGH AND RECORD YOUR WEIGHTS DAILY, PLEASE CONTACT OUR OFFICE AT [phone] IF YOU NOTE A WEIGHT GAIN OF 2-3 POUNDS WITHIN 24-48 HOURS OR A 5 POUND WEIGHT GAIN WITHIN ONE WEEK.

## 2012-04-11 NOTE — Assessment & Plan Note (Signed)
Appears well compensated today. He is trying to avoid salty foods, but continues to struggle with this. He is taking more control of his own health maintenance with daily wts and BP checks. I will refer him to [facility] for ongoing support and education to keep him compliant and motivated, with close follow up our office.  I have spoken to him about [facility] and he is willing to have them come by and talk with him.

## 2012-04-11 NOTE — Telephone Encounter (Signed)
email sent to Marja Kays to advise pt needs [facility] referral, pt preference to call home number [phone], referral placed in chart, pt aware someone will call him to set up apt date and time

## 2012-04-12 ENCOUNTER — Encounter: Payer: Self-pay | Admitting: *Deleted

## 2012-04-12 LAB — BASIC METABOLIC PANEL
BUN: 11 mg/dL (ref 6–23)
CO2: 31 mEq/L (ref 19–32)
Calcium: 9.3 mg/dL (ref 8.4–10.5)
Glucose, Bld: 94 mg/dL (ref 70–99)
Sodium: 141 mEq/L (ref 135–145)

## 2012-04-17 ENCOUNTER — Ambulatory Visit (INDEPENDENT_AMBULATORY_CARE_PROVIDER_SITE_OTHER): Payer: Medicare Other | Admitting: *Deleted

## 2012-04-17 DIAGNOSIS — Z7901 Long term (current) use of anticoagulants: Secondary | ICD-10-CM

## 2012-04-17 DIAGNOSIS — I4891 Unspecified atrial fibrillation: Secondary | ICD-10-CM

## 2012-04-21 DIAGNOSIS — G4700 Insomnia, unspecified: Secondary | ICD-10-CM | POA: Insufficient documentation

## 2012-04-21 NOTE — Assessment & Plan Note (Signed)
Still needs urology evaluation for this problem,

## 2012-04-21 NOTE — Progress Notes (Signed)
  Subjective:    Patient ID: Theodore [name], male    DOB: 04/18/1940, 72 y.o.   MRN: [id number]  HPI Pt in for f/u of recent hospitalization for Acute exacerbation of COPD as his primary dx.He was also treated for acute bronchitis He was hospitalized from 01/03 to 03/19/2012.He reports continued  improvement in his breathing since his recent d/c . He denies fever or chills, he maintains a chronic cough, with shortness of breath with minimal activity. He is now also followed by pulmonary for this problem. Pt is following fluid restriction and the THF protocol fairly closely with the help of THN. He denies any recent palpitaions, PND or orthopnea   Review of Systems See HPI . Denies sinus pressure, nasal congestion, ear pain or sore throat. Chronic  chest congestion, productive cough or wheezing. Denies chest pains, palpitations and leg swelling Denies abdominal pain, nausea, vomiting,diarrhea or constipation.   Denies dysuria, frequency, hesitancy or incontinence. Denies joint pain, swelling and limitation in mobility. Denies headaches, seizures, numbness, or tingling. Denies depression, anxiety or insomnia. Denies skin break down or rash.        Objective:   Physical Exam  Patient alert and oriented and in no cardiopulmonary distress.  HEENT: No facial asymmetry, EOMI, no sinus tenderness,  oropharynx pink and moist.  Neck supple no adenopathy.  Chest: decreased though adequate air entry, scattered wheezes, no crackles CVS: S1, S2  no S3.No JVD  ABD: Soft non tender. Bowel sounds normal.  Ext: No edema  MS: Adequate ROM spine, shoulders, hips and knees.  Skin: Intact, no ulcerations or rash noted.  Psych: Good eye contact, normal affect. not anxious or depressed appearing.  CNS: CN 2-12 intact, power, tone and sensation normal throughout.       Assessment & Plan:

## 2012-04-21 NOTE — Assessment & Plan Note (Signed)
Currently stable, pt back to baseline following recent hospitalization

## 2012-04-21 NOTE — Assessment & Plan Note (Signed)
Controlled, no change in medication  

## 2012-04-21 NOTE — Assessment & Plan Note (Signed)
Treated during recent hospitalization

## 2012-04-21 NOTE — Assessment & Plan Note (Signed)
Uncontrolled, sleep hygiene reviewed and restoril to be refilled

## 2012-05-10 ENCOUNTER — Ambulatory Visit (INDEPENDENT_AMBULATORY_CARE_PROVIDER_SITE_OTHER): Payer: Medicare Other | Admitting: *Deleted

## 2012-05-10 ENCOUNTER — Ambulatory Visit (INDEPENDENT_AMBULATORY_CARE_PROVIDER_SITE_OTHER): Payer: Medicare Other | Admitting: Adult Health

## 2012-05-10 ENCOUNTER — Encounter: Payer: Self-pay | Admitting: Adult Health

## 2012-05-10 VITALS — BP 118/78 | HR 62 | Ht 66.0 in | Wt 153.1 lb

## 2012-05-10 DIAGNOSIS — Z7901 Long term (current) use of anticoagulants: Secondary | ICD-10-CM

## 2012-05-10 DIAGNOSIS — I4891 Unspecified atrial fibrillation: Secondary | ICD-10-CM

## 2012-05-10 DIAGNOSIS — I509 Heart failure, unspecified: Secondary | ICD-10-CM

## 2012-05-10 DIAGNOSIS — I5030 Unspecified diastolic (congestive) heart failure: Secondary | ICD-10-CM

## 2012-05-10 NOTE — Progress Notes (Deleted)
Name: Theodore [name]    DOB: 07-01-1940  Age: 72 y.o.  MR#: [id number]       PCP:  Syliva Overman, MD      Insurance: Payor: Cleatrice Burke MEDICARE  Plan: AARP MEDICARE COMPLETE  Product Type: *No Product type*    CC:   No chief complaint on file.   VS There were no vitals filed for this visit.  Weights Current Weight  04/11/12 155 lb (70.308 kg)  04/03/12 157 lb 8 oz (71.442 kg)  04/02/12 156 lb 1.9 oz (70.816 kg)    Blood Pressure  BP Readings from Last 3 Encounters:  04/11/12 108/70  04/03/12 112/67  04/02/12 126/72     Admit date:  (Not on file) Last encounter with RMR:  04/11/2012   Allergy Review of patient's allergies indicates no known allergies.  Current Outpatient Prescriptions  Medication Sig Dispense Refill  . albuterol (PROAIR HFA) 108 (90 BASE) MCG/ACT inhaler Inhale 2 puffs into the lungs every 4 (four) hours as needed for wheezing.  8.5 g  3  . albuterol (PROVENTIL) (2.5 MG/3ML) 0.083% nebulizer solution Take 3 mLs (2.5 mg total) by nebulization 3 (three) times daily.  75 mL  1  . brimonidine-timolol (COMBIGAN) 0.2-0.5 % ophthalmic solution Place 1 drop into both eyes every 12 (twelve) hours.        . budesonide-formoterol (SYMBICORT) 160-4.5 MCG/ACT inhaler Inhale 2 puffs into the lungs 2 (two) times daily.  10.2 g  3  . furosemide (LASIX) 40 MG tablet Take 1 tablet (40 mg total) by mouth daily.  60 tablet  3  . lisinopril (PRINIVIL,ZESTRIL) 5 MG tablet Take 1 tablet (5 mg total) by mouth daily.  30 tablet  1  . montelukast (SINGULAIR) 10 MG tablet Take 10 mg by mouth at bedtime.      . potassium chloride (KLOR-CON 10) 10 MEQ tablet Take 1 tablet (10 mEq total) by mouth daily after breakfast.  30 tablet  5  . temazepam (RESTORIL) 15 MG capsule Take 1 capsule (15 mg total) by mouth at bedtime as needed for sleep.  30 capsule  3  . tiotropium (SPIRIVA HANDIHALER) 18 MCG inhalation capsule Place 1 capsule (18 mcg total) into inhaler and inhale daily.  30  capsule  3  . travoprost, benzalkonium, (TRAVATAN Z) 0.004 % ophthalmic solution Place 1 drop into both eyes at bedtime.       Marland Kitchen warfarin (COUMADIN) 4 MG tablet Take 4-8 mg by mouth daily. Take 2 tablets (8 mg) on Tuesdays and Fridays, then take 1 tablet (4 mg) on Sundays, Mondays,Wednesdays,Thursdays,Saturdays       No current facility-administered medications for this visit.    Discontinued Meds:   There are no discontinued medications.  Patient Active Problem List  Diagnosis  . GOUT  . ANEMIA-NOS  . ERECTILE DYSFUNCTION  . HYPERTENSION  . Atrial fibrillation  . Acute exacerbation of CHF (congestive heart failure)  . ALLERGIC RHINITIS  . CHRONIC OBSTRUCTIVE PULMONARY DISEASE, ACUTE EXACERBATION  . INGUINAL HERNIA, LEFT  . LOW BACK PAIN, ACUTE  . TRIGGER FINGER  . EDEMA LEG  . Encounter for long-term (current) use of other medications  . Special screening for malignant neoplasms, colon  . Need for prophylactic vaccination and inoculation against influenza  . Long term current use of anticoagulant  . Shingles  . Tobacco abuse disorder  . Bronchitis  . Elevated PSA  . COPD (chronic obstructive pulmonary disease)  . Glaucoma  . Hypoxia  .  Acute bronchitis  . Bradycardia  . Diastolic CHF with preserved left ventricular function, NYHA class 4  . Insomnia    LABS    Component Value Date/Time   NA 141 04/11/2012 1405   NA 143 03/29/2012 1241   NA 135 03/19/2012 0500   K 4.3 04/11/2012 1405   K 4.3 03/29/2012 1241   K 4.6 03/19/2012 0500   CL 100 04/11/2012 1405   CL 102 03/29/2012 1241   CL 95* 03/19/2012 0500   CO2 31 04/11/2012 1405   CO2 36* 03/29/2012 1241   CO2 40* 03/19/2012 0500   GLUCOSE 94 04/11/2012 1405   GLUCOSE 84 03/29/2012 1241   GLUCOSE 83 03/19/2012 0500   BUN 11 04/11/2012 1405   BUN 28* 03/29/2012 1241   BUN 24* 03/19/2012 0500   CREATININE 1.03 04/11/2012 1405   CREATININE 0.96 03/29/2012 1241   CREATININE 0.97 03/19/2012 0500   CREATININE 0.94 03/16/2012 0606    CALCIUM 9.3 04/11/2012 1405   CALCIUM 9.4 03/29/2012 1241   CALCIUM 9.2 03/19/2012 0500   GFRNONAA 81* 03/29/2012 1241   GFRNONAA 81* 03/19/2012 0500   GFRNONAA 82* 03/16/2012 0606   GFRAA >90 03/29/2012 1241   GFRAA >90 03/19/2012 0500   GFRAA >90 03/16/2012 0606   CMP     Component Value Date/Time   NA 141 04/11/2012 1405   K 4.3 04/11/2012 1405   CL 100 04/11/2012 1405   CO2 31 04/11/2012 1405   GLUCOSE 94 04/11/2012 1405   BUN 11 04/11/2012 1405   CREATININE 1.03 04/11/2012 1405   CREATININE 0.96 03/29/2012 1241   CALCIUM 9.3 04/11/2012 1405   PROT 7.3 03/29/2012 1241   ALBUMIN 3.5 03/29/2012 1241   AST 23 03/29/2012 1241   ALT 26 03/29/2012 1241   ALKPHOS 88 03/29/2012 1241   BILITOT 0.4 03/29/2012 1241   GFRNONAA 81* 03/29/2012 1241   GFRAA >90 03/29/2012 1241       Component Value Date/Time   WBC 11.8* 03/29/2012 1241   WBC 12.0* 03/19/2012 0500   WBC 3.3* 03/16/2012 0606   HGB 12.3* 03/29/2012 1241   HGB 11.6* 03/19/2012 0500   HGB 11.3* 03/16/2012 0606   HCT 39.2 03/29/2012 1241   HCT 37.9* 03/19/2012 0500   HCT 36.2* 03/16/2012 0606   MCV 95.1 03/29/2012 1241   MCV 94.8 03/19/2012 0500   MCV 94.0 03/16/2012 0606    Lipid Panel     Component Value Date/Time   CHOL  Value: 123        ATP III CLASSIFICATION:  <200     mg/dL   Desirable  914-782  mg/dL   Borderline High  >=956    mg/dL   High        21/05/863 0620   TRIG 68 12/19/2009 0620   HDL 38* 12/19/2009 0620   CHOLHDL 3.2 12/19/2009 0620   VLDL 14 12/19/2009 0620   LDLCALC  Value: 71        Total Cholesterol/HDL:CHD Risk Coronary Heart Disease Risk Table                     Men   Women  1/2 Average Risk   3.4   3.3  Average Risk       5.0   4.4  2 X Average Risk   9.6   7.1  3 X Average Risk  23.4   11.0        Use the calculated Patient Ratio above  and the CHD Risk Table to determine the patient's CHD Risk.        ATP III CLASSIFICATION (LDL):  <100     mg/dL   Optimal  161-096  mg/dL   Near or Above                    Optimal  130-159  mg/dL    Borderline  045-409  mg/dL   High  >811     mg/dL   Very High 91/06/7827 5621    ABG    Component Value Date/Time   PHART 7.348* 10/02/2010 1921   PCO2ART 54.3* 10/02/2010 1921   PO2ART 73.5* 10/02/2010 1921   HCO3 29.1* 10/02/2010 1921   TCO2 26.5 10/02/2010 1921   O2SAT 94.0 10/02/2010 1921     Lab Results  Component Value Date   TSH 1.179 03/18/2012   BNP (last 3 results)  Recent Labs  03/15/12 1453 03/29/12 1241  PROBNP 2263.0* 3788.0*   Cardiac Panel (last 3 results) No results found for this basename: CKTOTAL, CKMB, TROPONINI, RELINDX,  in the last 72 hours  Iron/TIBC/Ferritin No results found for this basename: iron, tibc, ferritin     EKG Orders placed in visit on 04/03/12  . EKG 12-LEAD     Prior Assessment and Plan Problem List as of 05/10/2012     ICD-9-CM     Cardiology Problems   Acute exacerbation of CHF (congestive heart failure)   Last Assessment & Plan   04/11/2012 Office Visit Written 04/11/2012  2:17 PM by Jodelle Gross, NP     Appears well compensated today. He is trying to avoid salty foods, but continues to struggle with this. He is taking more control of his own health maintenance with daily wts and BP checks. I will refer him to [facility] for ongoing support and education to keep him compliant and motivated, with close follow up our office.  I have spoken to him about [facility] and he is willing to have them come by and talk with him.    HYPERTENSION   Last Assessment & Plan   04/02/2012 Office Visit Written 04/21/2012  1:28 PM by Kerri Perches, MD     Controlled, no change in medication     Atrial fibrillation   Last Assessment & Plan   04/11/2012 Office Visit Written 04/11/2012  2:11 PM by Jodelle Gross, NP     Heart rate is well controled. He is complaint with medications. He will continue in our coumadin clinic for ongoing dosing.    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     Glaucoma   GOUT   ANEMIA-NOS   ERECTILE DYSFUNCTION   ALLERGIC RHINITIS   CHRONIC OBSTRUCTIVE PULMONARY DISEASE, ACUTE EXACERBATION   Last Assessment & Plan   04/03/2011 Office Visit Written 04/03/2011  8:28 PM by Salley Scarlet, MD     Improvement s/p admission and antibiotics. I think his SOB currently is more CHF    INGUINAL HERNIA, LEFT   LOW BACK PAIN, ACUTE   TRIGGER FINGER   EDEMA LEG   Last Assessment & Plan   05/11/2011 Office Visit Written 05/14/2011  7:20 AM by  Kerri Perches, MD     Chronic right lower ext edema , anti coagullated, likely due to venous insufficiency, py reports swelling resolves with leg elevation    Encounter for long-term (current) use of other medications   Special screening for malignant neoplasms, colon   Need for prophylactic vaccination and inoculation against influenza   Long term current use of anticoagulant   Shingles   Last Assessment & Plan   08/29/2010 Office Visit Written 09/14/2010  3:15 PM by Syliva Overman, MD     Diagnosed on the day of the visit in the Ed, he is to start medication, and advised not to scratch the rash    Bronchitis   Last Assessment & Plan   05/11/2011 Office Visit Written 05/14/2011  7:20 AM by Kerri Perches, MD     Acute symptoms, antibiotic prescribed    Elevated PSA   Last Assessment & Plan   04/02/2012 Office Visit Written 04/21/2012  1:31 PM by Kerri Perches, MD     Still needs urology evaluation for this problem,    COPD (chronic obstructive pulmonary disease)   Last Assessment & Plan   04/02/2012 Office Visit Written 04/21/2012  1:29 PM by Kerri Perches, MD     Currently stable, pt back to baseline following recent hospitalization    Hypoxia   Acute bronchitis   Last Assessment & Plan   04/02/2012 Office Visit Written  04/21/2012  1:29 PM by Kerri Perches, MD     Treated during recent hospitalization    Bradycardia   Insomnia   Last Assessment & Plan   04/02/2012 Office Visit Written 04/21/2012  1:32 PM by Kerri Perches, MD     Uncontrolled, sleep hygiene reviewed and restoril to be refilled        Imaging: No results found.

## 2012-05-10 NOTE — Patient Instructions (Addendum)
Your physician recommends that you schedule a follow-up appointment in: 6 WEEKS  

## 2012-05-10 NOTE — Assessment & Plan Note (Signed)
Wt is stable, without evidence of fluid retention on exam. He is medically complaint. I have given him lots of encouragement to continue his healthy lifestyle. I will see him again in 6 weeks. He should continue daily wts and avoid salt. He verbalizes understanding.

## 2012-05-10 NOTE — Assessment & Plan Note (Signed)
Excellent control of his BP today. Review of his recordings continue to show that his BP is doing well as OP. Continue current medication regimen.

## 2012-05-10 NOTE — Progress Notes (Signed)
HPI: Theodore [name] is a 72 y/o patient of Dr.Rothbart, we are following for ongoing assessment and management of atrial fibrillation, hypertension, and diastolic CHF with preserved LV fx. He is now being seen on a monthly basis for close follow up due to medical non-compliance issues, dietary noncompliance. He is without complaint today. He has brought with him his wt charge and BP records. He has taken an active interest in his health now. His wt is well controlled and he is staying away from salty foods as much as he can.  He is without complaint of edema, dyspnea or palpitations. He needs to have INR checked today in our office. No Known Allergies  Current Outpatient Prescriptions  Medication Sig Dispense Refill  . albuterol (PROAIR HFA) 108 (90 BASE) MCG/ACT inhaler Inhale 2 puffs into the lungs every 4 (four) hours as needed for wheezing.  8.5 g  3  . albuterol (PROVENTIL) (2.5 MG/3ML) 0.083% nebulizer solution Take 3 mLs (2.5 mg total) by nebulization 3 (three) times daily.  75 mL  1  . brimonidine-timolol (COMBIGAN) 0.2-0.5 % ophthalmic solution Place 1 drop into both eyes every 12 (twelve) hours.        . budesonide-formoterol (SYMBICORT) 160-4.5 MCG/ACT inhaler Inhale 2 puffs into the lungs 2 (two) times daily.  10.2 g  3  . furosemide (LASIX) 40 MG tablet Take 1 tablet (40 mg total) by mouth daily.  60 tablet  3  . lisinopril (PRINIVIL,ZESTRIL) 5 MG tablet Take 1 tablet (5 mg total) by mouth daily.  30 tablet  1  . montelukast (SINGULAIR) 10 MG tablet Take 10 mg by mouth at bedtime.      . potassium chloride (KLOR-CON 10) 10 MEQ tablet Take 1 tablet (10 mEq total) by mouth daily after breakfast.  30 tablet  5  . temazepam (RESTORIL) 15 MG capsule Take 1 capsule (15 mg total) by mouth at bedtime as needed for sleep.  30 capsule  3  . tiotropium (SPIRIVA HANDIHALER) 18 MCG inhalation capsule Place 1 capsule (18 mcg total) into inhaler and inhale daily.  30 capsule  3  . travoprost,  benzalkonium, (TRAVATAN Z) 0.004 % ophthalmic solution Place 1 drop into both eyes at bedtime.       Marland Kitchen warfarin (COUMADIN) 4 MG tablet Take 4-8 mg by mouth daily. Take 2 tablets (8 mg) on Tuesdays and Fridays, then take 1 tablet (4 mg) on Sundays, Mondays,Wednesdays,Thursdays,Saturdays       No current facility-administered medications for this visit.    Past Medical History  Diagnosis Date  . Anemia     Hemoglobin 11.6 in 04/2008->resolved   . Edema     Lower Extremity  . Tobacco abuse   . Glaucoma(365)   . Allergic rhinitis   . Atrial fibrillation     Coumadin;asymptomatic bradycardia on telemetry in 12/2009  . CHF (congestive heart failure)     H/o CHF with preserved LV EF; pulmonary hypertension; normal EF in 03/2009  . Hypertension   . Shingles   . Shortness of breath   . COPD (chronic obstructive pulmonary disease)   . Asthma     Past Surgical History  Procedure Laterality Date  . Bilateral cataract surgery    . Herniorrhapy    . Tendon repair      right hand surgical procedure for a tendon repair    ZOX:WRUEAV of systems complete and found to be negative unless listed above  PHYSICAL EXAM BP 118/78  Pulse 62  Ht 5\' 6"  (1.676 m)  Wt 153 lb 1.9 oz (69.455 kg)  BMI 24.73 kg/m2  ZOX:WRUEAV of systems complete and found to be negative unless listed above  PHYSICAL EXAM BP 118/78  Pulse 62  Ht 5\' 6"  (1.676 m)  Wt 153 lb 1.9 oz (69.455 kg)  BMI 24.73 kg/m2 General: Well developed, well nourished, in no acute distress Head: Eyes PERRLA, No xanthomas.   Normal cephalic and atramatic  Lungs: Clear bilaterally to auscultation and percussion. Heart: HRRR S1 S2, without MRG.  Pulses are 2+ & equal.            No carotid bruit. No JVD.  No abdominal bruits. No femoral bruits. Abdomen: Bowel sounds are positive, abdomen soft and non-tender without masses or                  Hernia's noted. Msk:  Back normal, normal gait. Normal strength and tone for age. Extremities:  No clubbing, cyanosis or edema.  DP +1 Neuro: Alert and oriented X 3. Psych:  Good affect, responds appropriately   ASSESSMENT AND PLAN

## 2012-05-10 NOTE — Assessment & Plan Note (Signed)
Heart rate is very well controlled today. He remains medically compliant. I am encouraged that he is has taken interest in his own health maintanence. INR today is 2.1.

## 2012-05-13 ENCOUNTER — Other Ambulatory Visit: Payer: Self-pay | Admitting: Cardiology

## 2012-05-13 ENCOUNTER — Other Ambulatory Visit: Payer: Self-pay | Admitting: Family Medicine

## 2012-05-15 ENCOUNTER — Encounter (HOSPITAL_COMMUNITY): Payer: Self-pay

## 2012-05-15 ENCOUNTER — Inpatient Hospital Stay (HOSPITAL_COMMUNITY)
Admission: EM | Admit: 2012-05-15 | Discharge: 2012-05-20 | DRG: 193 | Disposition: A | Discharge status: Home / Self Care | Payer: Medicare Other | Attending: Internal Medicine | Admitting: Internal Medicine

## 2012-05-15 ENCOUNTER — Emergency Department (HOSPITAL_COMMUNITY): Payer: Medicare Other

## 2012-05-15 DIAGNOSIS — J309 Allergic rhinitis, unspecified: Secondary | ICD-10-CM

## 2012-05-15 DIAGNOSIS — J189 Pneumonia, unspecified organism: Secondary | ICD-10-CM

## 2012-05-15 DIAGNOSIS — J9620 Acute and chronic respiratory failure, unspecified whether with hypoxia or hypercapnia: Secondary | ICD-10-CM | POA: Diagnosis present

## 2012-05-15 DIAGNOSIS — J4489 Other specified chronic obstructive pulmonary disease: Secondary | ICD-10-CM | POA: Diagnosis present

## 2012-05-15 DIAGNOSIS — R0902 Hypoxemia: Secondary | ICD-10-CM

## 2012-05-15 DIAGNOSIS — R972 Elevated prostate specific antigen [PSA]: Secondary | ICD-10-CM

## 2012-05-15 DIAGNOSIS — J9600 Acute respiratory failure, unspecified whether with hypoxia or hypercapnia: Secondary | ICD-10-CM | POA: Diagnosis present

## 2012-05-15 DIAGNOSIS — H409 Unspecified glaucoma: Secondary | ICD-10-CM

## 2012-05-15 DIAGNOSIS — M6530 Trigger finger, unspecified finger: Secondary | ICD-10-CM

## 2012-05-15 DIAGNOSIS — I5030 Unspecified diastolic (congestive) heart failure: Secondary | ICD-10-CM

## 2012-05-15 DIAGNOSIS — Z1211 Encounter for screening for malignant neoplasm of colon: Secondary | ICD-10-CM

## 2012-05-15 DIAGNOSIS — K4090 Unilateral inguinal hernia, without obstruction or gangrene, not specified as recurrent: Secondary | ICD-10-CM

## 2012-05-15 DIAGNOSIS — M545 Low back pain, unspecified: Secondary | ICD-10-CM

## 2012-05-15 DIAGNOSIS — Z23 Encounter for immunization: Secondary | ICD-10-CM

## 2012-05-15 DIAGNOSIS — I509 Heart failure, unspecified: Secondary | ICD-10-CM

## 2012-05-15 DIAGNOSIS — J441 Chronic obstructive pulmonary disease with (acute) exacerbation: Secondary | ICD-10-CM

## 2012-05-15 DIAGNOSIS — F528 Other sexual dysfunction not due to a substance or known physiological condition: Secondary | ICD-10-CM

## 2012-05-15 DIAGNOSIS — M109 Gout, unspecified: Secondary | ICD-10-CM

## 2012-05-15 DIAGNOSIS — E876 Hypokalemia: Secondary | ICD-10-CM | POA: Diagnosis present

## 2012-05-15 DIAGNOSIS — B029 Zoster without complications: Secondary | ICD-10-CM

## 2012-05-15 DIAGNOSIS — I10 Essential (primary) hypertension: Secondary | ICD-10-CM

## 2012-05-15 DIAGNOSIS — Z87891 Personal history of nicotine dependence: Secondary | ICD-10-CM

## 2012-05-15 DIAGNOSIS — I4891 Unspecified atrial fibrillation: Secondary | ICD-10-CM

## 2012-05-15 DIAGNOSIS — G4700 Insomnia, unspecified: Secondary | ICD-10-CM

## 2012-05-15 DIAGNOSIS — Z7901 Long term (current) use of anticoagulants: Secondary | ICD-10-CM

## 2012-05-15 DIAGNOSIS — R001 Bradycardia, unspecified: Secondary | ICD-10-CM

## 2012-05-15 DIAGNOSIS — Z91199 Patient's noncompliance with other medical treatment and regimen due to unspecified reason: Secondary | ICD-10-CM

## 2012-05-15 DIAGNOSIS — Z79899 Other long term (current) drug therapy: Secondary | ICD-10-CM

## 2012-05-15 DIAGNOSIS — R609 Edema, unspecified: Secondary | ICD-10-CM

## 2012-05-15 DIAGNOSIS — J40 Bronchitis, not specified as acute or chronic: Secondary | ICD-10-CM

## 2012-05-15 DIAGNOSIS — Z720 Tobacco use: Secondary | ICD-10-CM

## 2012-05-15 DIAGNOSIS — I5032 Chronic diastolic (congestive) heart failure: Secondary | ICD-10-CM | POA: Diagnosis present

## 2012-05-15 DIAGNOSIS — J209 Acute bronchitis, unspecified: Secondary | ICD-10-CM

## 2012-05-15 DIAGNOSIS — J449 Chronic obstructive pulmonary disease, unspecified: Secondary | ICD-10-CM | POA: Diagnosis present

## 2012-05-15 DIAGNOSIS — D649 Anemia, unspecified: Secondary | ICD-10-CM

## 2012-05-15 LAB — PROTIME-INR: INR: 1.63 — ABNORMAL HIGH (ref 0.00–1.49)

## 2012-05-15 LAB — INFLUENZA PANEL BY PCR (TYPE A & B)
H1N1 flu by pcr: NOT DETECTED
Influenza B By PCR: NEGATIVE

## 2012-05-15 LAB — BASIC METABOLIC PANEL
BUN: 29 mg/dL — ABNORMAL HIGH (ref 6–23)
Chloride: 94 mEq/L — ABNORMAL LOW (ref 96–112)
Glucose, Bld: 126 mg/dL — ABNORMAL HIGH (ref 70–99)
Potassium: 3.1 mEq/L — ABNORMAL LOW (ref 3.5–5.1)

## 2012-05-15 LAB — CBC
HCT: 36 % — ABNORMAL LOW (ref 39.0–52.0)
Hemoglobin: 9.9 g/dL — ABNORMAL LOW (ref 13.0–17.0)
MCH: 28.6 pg (ref 26.0–34.0)
MCHC: 31.5 g/dL (ref 30.0–36.0)
MCV: 90.5 fL (ref 78.0–100.0)
Platelets: 212 10*3/uL (ref 150–400)
RBC: 3.98 MIL/uL — ABNORMAL LOW (ref 4.22–5.81)
RDW: 13.7 % (ref 11.5–15.5)
WBC: 12.1 10*3/uL — ABNORMAL HIGH (ref 4.0–10.5)

## 2012-05-15 LAB — BLOOD GAS, ARTERIAL
Bicarbonate: 29.4 mEq/L — ABNORMAL HIGH (ref 20.0–24.0)
Drawn by: 22223
O2 Content: 4 L/min
Patient temperature: 37
pH, Arterial: 7.415 (ref 7.350–7.450)

## 2012-05-15 LAB — COMPREHENSIVE METABOLIC PANEL
ALT: 10 U/L (ref 0–53)
AST: 23 U/L (ref 0–37)
Alkaline Phosphatase: 65 U/L (ref 39–117)
CO2: 32 mEq/L (ref 19–32)
Chloride: 96 mEq/L (ref 96–112)
GFR calc Af Amer: 64 mL/min — ABNORMAL LOW (ref >90)
GFR calc non Af Amer: 55 mL/min — ABNORMAL LOW (ref >90)
Glucose, Bld: 113 mg/dL — ABNORMAL HIGH (ref 70–99)
Potassium: 2.8 mEq/L — ABNORMAL LOW (ref 3.5–5.1)
Sodium: 139 mEq/L (ref 135–145)
Total Bilirubin: 1.1 mg/dL (ref 0.3–1.2)

## 2012-05-15 LAB — MRSA PCR SCREENING: MRSA by PCR: NEGATIVE

## 2012-05-15 LAB — MAGNESIUM: Magnesium: 1.8 mg/dL (ref 1.5–2.5)

## 2012-05-15 LAB — STREP PNEUMONIAE URINARY ANTIGEN: Strep Pneumo Urinary Antigen: NEGATIVE

## 2012-05-15 MED ORDER — FUROSEMIDE 10 MG/ML IJ SOLN
60.0000 mg | Freq: Once | INTRAMUSCULAR | Status: AC
  Administered 2012-05-15: 60 mg via INTRAVENOUS
  Filled 2012-05-15: qty 6

## 2012-05-15 MED ORDER — SODIUM CHLORIDE 0.9 % IV SOLN
250.0000 mL | INTRAVENOUS | Status: DC | PRN
  Administered 2012-05-18: 250 mL via INTRAVENOUS

## 2012-05-15 MED ORDER — LEVALBUTEROL HCL 0.63 MG/3ML IN NEBU: 0.6300 mg | INHALATION_SOLUTION | Freq: Four times a day (QID) | RESPIRATORY_TRACT | Status: DC | PRN

## 2012-05-15 MED ORDER — POTASSIUM CHLORIDE CRYS ER 20 MEQ PO TBCR
40.0000 meq | EXTENDED_RELEASE_TABLET | Freq: Three times a day (TID) | ORAL | Status: DC
  Administered 2012-05-15 (×3): 40 meq via ORAL
  Filled 2012-05-15 (×3): qty 2

## 2012-05-15 MED ORDER — FUROSEMIDE 40 MG PO TABS: 40.0000 mg | ORAL_TABLET | Freq: Every day | ORAL | Status: DC

## 2012-05-15 MED ORDER — POTASSIUM CHLORIDE CRYS ER 20 MEQ PO TBCR: 40.0000 meq | EXTENDED_RELEASE_TABLET | Freq: Every day | ORAL | Status: DC

## 2012-05-15 MED ORDER — TIMOLOL MALEATE 0.5 % OP SOLN
1.0000 [drp] | Freq: Two times a day (BID) | OPHTHALMIC | Status: DC
  Administered 2012-05-15: 1 [drp] via OPHTHALMIC
  Filled 2012-05-15: qty 5

## 2012-05-15 MED ORDER — SODIUM CHLORIDE 0.9 % IJ SOLN
3.0000 mL | Freq: Two times a day (BID) | INTRAMUSCULAR | Status: DC
  Administered 2012-05-15 – 2012-05-20 (×7): 3 mL via INTRAVENOUS

## 2012-05-15 MED ORDER — ONDANSETRON HCL 4 MG/2ML IJ SOLN: 4.0000 mg | Freq: Four times a day (QID) | INTRAMUSCULAR | Status: DC | PRN

## 2012-05-15 MED ORDER — WARFARIN - PHARMACIST DOSING INPATIENT: Freq: Every day | Status: DC

## 2012-05-15 MED ORDER — ENSURE COMPLETE PO LIQD
237.0000 mL | Freq: Two times a day (BID) | ORAL | Status: DC
  Administered 2012-05-15 – 2012-05-20 (×7): 237 mL via ORAL

## 2012-05-15 MED ORDER — DOCUSATE SODIUM 100 MG PO CAPS: 100.0000 mg | ORAL_CAPSULE | Freq: Two times a day (BID) | ORAL | Status: DC

## 2012-05-15 MED ORDER — SODIUM CHLORIDE 0.9 % IJ SOLN: 3.0000 mL | INTRAMUSCULAR | Status: DC | PRN

## 2012-05-15 MED ORDER — POTASSIUM CHLORIDE CRYS ER 20 MEQ PO TBCR
40.0000 meq | EXTENDED_RELEASE_TABLET | Freq: Once | ORAL | Status: AC
  Administered 2012-05-15: 40 meq via ORAL
  Filled 2012-05-15: qty 2

## 2012-05-15 MED ORDER — LEVOFLOXACIN IN D5W 750 MG/150ML IV SOLN
INTRAVENOUS | Status: AC
  Filled 2012-05-15: qty 150

## 2012-05-15 MED ORDER — DILTIAZEM HCL 100 MG IV SOLR
5.0000 mg/h | INTRAVENOUS | Status: DC
  Administered 2012-05-15: 5 mg/h via INTRAVENOUS

## 2012-05-15 MED ORDER — ALBUTEROL SULFATE (5 MG/ML) 0.5% IN NEBU: 2.5000 mg | INHALATION_SOLUTION | Freq: Three times a day (TID) | RESPIRATORY_TRACT | Status: DC

## 2012-05-15 MED ORDER — METOPROLOL TARTRATE 1 MG/ML IV SOLN: 2.5000 mg | INTRAVENOUS | Status: DC | PRN

## 2012-05-15 MED ORDER — POTASSIUM CHLORIDE ER 10 MEQ PO TBCR
40.0000 meq | EXTENDED_RELEASE_TABLET | Freq: Every day | ORAL | Status: DC
Start: 2012-05-15 — End: 2012-05-15
  Filled 2012-05-15: qty 4

## 2012-05-15 MED ORDER — BRIMONIDINE TARTRATE-TIMOLOL 0.2-0.5 % OP SOLN: 1.0000 [drp] | Freq: Two times a day (BID) | OPHTHALMIC | Status: DC

## 2012-05-15 MED ORDER — CEFTRIAXONE SODIUM 1 G IJ SOLR: 1.0000 g | Freq: Once | INTRAMUSCULAR | Status: DC

## 2012-05-15 MED ORDER — DILTIAZEM LOAD VIA INFUSION
10.0000 mg | Freq: Once | INTRAVENOUS | Status: DC | PRN
  Filled 2012-05-15: qty 10

## 2012-05-15 MED ORDER — LISINOPRIL 5 MG PO TABS
5.0000 mg | ORAL_TABLET | Freq: Every day | ORAL | Status: DC
  Administered 2012-05-15 – 2012-05-16 (×2): 5 mg via ORAL
  Filled 2012-05-15 (×2): qty 1

## 2012-05-15 MED ORDER — TIOTROPIUM BROMIDE MONOHYDRATE 18 MCG IN CAPS
18.0000 ug | ORAL_CAPSULE | Freq: Every day | RESPIRATORY_TRACT | Status: DC
  Administered 2012-05-15 – 2012-05-20 (×6): 18 ug via RESPIRATORY_TRACT
  Filled 2012-05-15 (×2): qty 5

## 2012-05-15 MED ORDER — DILTIAZEM HCL 100 MG IV SOLR
5.0000 mg/h | INTRAVENOUS | Status: DC
  Administered 2012-05-15: 5 mg/h via INTRAVENOUS
  Filled 2012-05-15: qty 100

## 2012-05-15 MED ORDER — ALBUTEROL SULFATE (5 MG/ML) 0.5% IN NEBU: 2.5000 mg | INHALATION_SOLUTION | RESPIRATORY_TRACT | Status: DC | PRN

## 2012-05-15 MED ORDER — ALBUTEROL SULFATE (5 MG/ML) 0.5% IN NEBU
2.5000 mg | INHALATION_SOLUTION | RESPIRATORY_TRACT | Status: DC
  Administered 2012-05-15: 2.5 mg via RESPIRATORY_TRACT
  Filled 2012-05-15: qty 0.5

## 2012-05-15 MED ORDER — ONDANSETRON HCL 4 MG PO TABS: 4.0000 mg | ORAL_TABLET | Freq: Four times a day (QID) | ORAL | Status: DC | PRN

## 2012-05-15 MED ORDER — ACETAMINOPHEN 650 MG RE SUPP: 650.0000 mg | Freq: Four times a day (QID) | RECTAL | Status: DC | PRN

## 2012-05-15 MED ORDER — LEVALBUTEROL HCL 0.63 MG/3ML IN NEBU
0.6300 mg | INHALATION_SOLUTION | Freq: Four times a day (QID) | RESPIRATORY_TRACT | Status: DC
  Administered 2012-05-15 – 2012-05-20 (×20): 0.63 mg via RESPIRATORY_TRACT
  Filled 2012-05-15 (×19): qty 3

## 2012-05-15 MED ORDER — LEVOFLOXACIN IN D5W 750 MG/150ML IV SOLN
750.0000 mg | INTRAVENOUS | Status: AC
  Administered 2012-05-15 – 2012-05-17 (×3): 750 mg via INTRAVENOUS
  Filled 2012-05-15 (×3): qty 150

## 2012-05-15 MED ORDER — VANCOMYCIN HCL IN DEXTROSE 1-5 GM/200ML-% IV SOLN
INTRAVENOUS | Status: AC
  Filled 2012-05-15: qty 200

## 2012-05-15 MED ORDER — WARFARIN SODIUM 7.5 MG PO TABS
7.5000 mg | ORAL_TABLET | Freq: Once | ORAL | Status: AC
  Administered 2012-05-15: 7.5 mg via ORAL
  Filled 2012-05-15: qty 1

## 2012-05-15 MED ORDER — TEMAZEPAM 15 MG PO CAPS
15.0000 mg | ORAL_CAPSULE | Freq: Every evening | ORAL | Status: DC | PRN
  Administered 2012-05-15 – 2012-05-19 (×4): 15 mg via ORAL
  Filled 2012-05-15 (×4): qty 1

## 2012-05-15 MED ORDER — IPRATROPIUM BROMIDE 0.02 % IN SOLN
0.5000 mg | RESPIRATORY_TRACT | Status: DC
  Administered 2012-05-15: 0.5 mg via RESPIRATORY_TRACT
  Filled 2012-05-15: qty 2.5

## 2012-05-15 MED ORDER — TRAVOPROST (BAK FREE) 0.004 % OP SOLN
1.0000 [drp] | Freq: Every day | OPHTHALMIC | Status: DC
  Administered 2012-05-15 – 2012-05-19 (×5): 1 [drp] via OPHTHALMIC
  Filled 2012-05-15: qty 2.5

## 2012-05-15 MED ORDER — BIOTENE DRY MOUTH MT LIQD
15.0000 mL | Freq: Four times a day (QID) | OROMUCOSAL | Status: DC
  Administered 2012-05-15 – 2012-05-20 (×14): 15 mL via OROMUCOSAL

## 2012-05-15 MED ORDER — SODIUM CHLORIDE 0.9 % IJ SOLN
3.0000 mL | Freq: Two times a day (BID) | INTRAMUSCULAR | Status: DC
  Administered 2012-05-15: 3 mL via INTRAVENOUS

## 2012-05-15 MED ORDER — VANCOMYCIN HCL IN DEXTROSE 1-5 GM/200ML-% IV SOLN
1000.0000 mg | Freq: Two times a day (BID) | INTRAVENOUS | Status: DC
  Administered 2012-05-15 – 2012-05-19 (×9): 1000 mg via INTRAVENOUS
  Filled 2012-05-15 (×11): qty 200

## 2012-05-15 MED ORDER — DEXTROSE 5 % IV SOLN
INTRAVENOUS | Status: AC
  Administered 2012-05-15: 1 g
  Filled 2012-05-15: qty 10

## 2012-05-15 MED ORDER — AZITHROMYCIN 250 MG PO TABS
500.0000 mg | ORAL_TABLET | Freq: Once | ORAL | Status: AC
  Administered 2012-05-15: 500 mg via ORAL
  Filled 2012-05-15: qty 2

## 2012-05-15 MED ORDER — BUDESONIDE-FORMOTEROL FUMARATE 160-4.5 MCG/ACT IN AERO
2.0000 | INHALATION_SPRAY | Freq: Two times a day (BID) | RESPIRATORY_TRACT | Status: DC
  Administered 2012-05-15 – 2012-05-20 (×11): 2 via RESPIRATORY_TRACT
  Filled 2012-05-15: qty 6

## 2012-05-15 MED ORDER — FUROSEMIDE 40 MG PO TABS
40.0000 mg | ORAL_TABLET | Freq: Every day | ORAL | Status: DC
  Administered 2012-05-16 – 2012-05-20 (×5): 40 mg via ORAL
  Filled 2012-05-15 (×5): qty 1

## 2012-05-15 MED ORDER — BRIMONIDINE TARTRATE 0.2 % OP SOLN
1.0000 [drp] | Freq: Two times a day (BID) | OPHTHALMIC | Status: DC
  Administered 2012-05-15: 1 [drp] via OPHTHALMIC
  Filled 2012-05-15: qty 5

## 2012-05-15 MED ORDER — DEXTROSE 5 % IV SOLN
1.0000 g | Freq: Three times a day (TID) | INTRAVENOUS | Status: DC
  Administered 2012-05-15 – 2012-05-19 (×13): 1 g via INTRAVENOUS
  Filled 2012-05-15 (×17): qty 1

## 2012-05-15 MED ORDER — MONTELUKAST SODIUM 10 MG PO TABS
10.0000 mg | ORAL_TABLET | Freq: Every day | ORAL | Status: DC
  Administered 2012-05-15 – 2012-05-19 (×5): 10 mg via ORAL
  Filled 2012-05-15 (×5): qty 1

## 2012-05-15 MED ORDER — ACETAMINOPHEN 325 MG PO TABS
650.0000 mg | ORAL_TABLET | Freq: Four times a day (QID) | ORAL | Status: DC | PRN
Start: 2012-05-15 — End: 2012-05-20
  Administered 2012-05-16 – 2012-05-20 (×7): 650 mg via ORAL
  Filled 2012-05-15 (×7): qty 2

## 2012-05-15 NOTE — Progress Notes (Signed)
ANTIBIOTIC CONSULT NOTE - INITIAL  Pharmacy Consult for vancomycin/cefepime/levofloxacin Indication: 8 days tx for pneumonia  No Known Allergies  Patient Measurements: Height: 5\' 6"  (167.6 cm) Weight: 149 lb 7.6 oz (67.8 kg) IBW/kg (Calculated) : 63.8   Vital Signs: Temp: 99.4 F (37.4 C) (03/05 0355) Temp src: Oral (03/05 0355) BP: 91/62 mmHg (03/05 0400) Pulse Rate: 34 (03/05 0400) Intake/Output from previous day:   Intake/Output from this shift:    Labs:  Recent Labs  05/15/12 0020  WBC 12.1*  HGB 11.5*  PLT 213  CREATININE 1.30   Estimated Creatinine Clearance: 46.4 ml/min (by C-G formula based on Cr of 1.3).   Microbiology: Recent Results (from the past 720 hour(s))  CULTURE, BLOOD (ROUTINE X 2)     Status: None   Collection Time    05/15/12 12:30 AM      Result Value Range Status   Specimen Description BLOOD RIGHT ANTECUBITAL   Final   Special Requests     Final   Value: BOTTLES DRAWN AEROBIC AND ANAEROBIC AEB 8CC ANA 6CC   Culture PENDING   Incomplete   Report Status PENDING   Incomplete  CULTURE, BLOOD (ROUTINE X 2)     Status: None   Collection Time    05/15/12 12:31 AM      Result Value Range Status   Specimen Description BLOOD LEFT HAND   Final   Special Requests BOTTLES DRAWN AEROBIC ONLY 5CC   Final   Culture PENDING   Incomplete   Report Status PENDING   Incomplete    Medical History: Past Medical History  Diagnosis Date  . Anemia     Hemoglobin 11.6 in 04/2008->resolved   . Edema     Lower Extremity  . Tobacco abuse   . Glaucoma(365)   . Allergic rhinitis   . Atrial fibrillation     Coumadin;asymptomatic bradycardia on telemetry in 12/2009  . CHF (congestive heart failure)     H/o CHF with preserved LV EF; pulmonary hypertension; normal EF in 03/2009  . Hypertension   . Shingles   . Shortness of breath   . COPD (chronic obstructive pulmonary disease)   . Asthma     Medications:  Scheduled:  . antiseptic oral rinse  15 mL  Mouth Rinse QID  . [COMPLETED] azithromycin  500 mg Oral Once  . brimonidine-timolol  1 drop Both Eyes Q12H  . budesonide-formoterol  2 puff Inhalation BID  . ceFEPime (MAXIPIME) IV  1 g Intravenous Q8H  . [COMPLETED] cefTRIAXone (ROCEPHIN) IVPB 1 gram/50 mL D5W      . docusate sodium  100 mg Oral BID  . [COMPLETED] furosemide  60 mg Intravenous Once  . furosemide  40 mg Oral Daily  . levofloxacin (LEVAQUIN) IV  750 mg Intravenous Q24H  . lisinopril  5 mg Oral Daily  . montelukast  10 mg Oral QHS  . potassium chloride  40 mEq Oral Daily  . potassium chloride  40 mEq Oral Once  . sodium chloride  3 mL Intravenous Q12H  . sodium chloride  3 mL Intravenous Q12H  . tiotropium  18 mcg Inhalation Daily  . Travoprost (BAK Free)  1 drop Both Eyes QHS  . vancomycin  1,000 mg Intravenous Q12H  . [DISCONTINUED] albuterol  2.5 mg Nebulization Q4H  . [DISCONTINUED] cefTRIAXone  1 g Intramuscular Once  . [DISCONTINUED] ipratropium  0.5 mg Nebulization Q4H   Infusions:  . diltiazem (CARDIZEM) infusion    . [DISCONTINUED] diltiazem (CARDIZEM)  infusion 15 mg/hr (05/15/12 0153)   PRN: sodium chloride, acetaminophen, acetaminophen, diltiazem, levalbuterol, ondansetron (ZOFRAN) IV, ondansetron, sodium chloride, temazepam  Assessment: 51yr male with probable pneumonia exacerbating hx of afib.  Pt has slightly elevated SCr  Goal of Therapy:  Adjust antibiotics for renal function  Plan:  1.  Will start vancomycin 1gm IV q12h this morning 2.  AM clinical pharmacist to review labs and i/o's to determine if renal function improving; will adjust regimens accordingly  Please note that patient had been stable on warfarin regimen prior to admission.  Will assess hx in am and order any changes in regimen as necessary.  Scarlett Presto 05/15/2012,4:35 AM

## 2012-05-15 NOTE — ED Notes (Signed)
Attempted to call report. Informed that nurse is in another room

## 2012-05-15 NOTE — Progress Notes (Signed)
ANTICOAGULATION CONSULT NOTE - Initial Consult  Pharmacy Consult for Coumadin Indication: atrial fibrillation  No Known Allergies  Patient Measurements: Height: 5\' 6"  (167.6 cm) Weight: 149 lb 7.6 oz (67.8 kg) IBW/kg (Calculated) : 63.8  Vital Signs: Temp: 97.9 F (36.6 C) (03/05 0733) Temp src: Oral (03/05 0733) BP: 88/58 mmHg (03/05 0752) Pulse Rate: 70 (03/05 0752)  Labs:  Recent Labs  05/15/12 0020 05/15/12 0423  HGB 11.5* 9.9*  HCT 36.0* 31.4*  PLT 213 212  LABPROT 18.8*  --   INR 1.63*  --   CREATININE 1.30 1.26  TROPONINI <0.30  --    Estimated Creatinine Clearance: 47.8 ml/min (by C-G formula based on Cr of 1.26).  Medical History: Past Medical History  Diagnosis Date  . Anemia     Hemoglobin 11.6 in 04/2008->resolved   . Edema     Lower Extremity  . Tobacco abuse   . Glaucoma(365)   . Allergic rhinitis   . Atrial fibrillation     Coumadin;asymptomatic bradycardia on telemetry in 12/2009  . CHF (congestive heart failure)     H/o CHF with preserved LV EF; pulmonary hypertension; normal EF in 03/2009  . Hypertension   . Shingles   . Shortness of breath   . COPD (chronic obstructive pulmonary disease)   . Asthma    Medications:  Prescriptions prior to admission  Medication Sig Dispense Refill  . albuterol (PROVENTIL) (2.5 MG/3ML) 0.083% nebulizer solution Take 3 mLs (2.5 mg total) by nebulization 3 (three) times daily.  75 mL  1  . azithromycin (ZITHROMAX) 250 MG tablet Take 250 mg by mouth daily.      . furosemide (LASIX) 40 MG tablet Take 1 tablet (40 mg total) by mouth daily.  60 tablet  3  . lisinopril (PRINIVIL,ZESTRIL) 5 MG tablet Take 1 tablet (5 mg total) by mouth daily.  30 tablet  1  . montelukast (SINGULAIR) 10 MG tablet Take 10 mg by mouth at bedtime.      . potassium chloride (K-DUR) 10 MEQ tablet TAKE 1 TABLET BY MOUTH EVERY DAY AFTER BREAKFAST ** D/C TABLETS  30 tablet  3  . PROAIR HFA 108 (90 BASE) MCG/ACT inhaler INHALE 2 PUFFS  BY MOUTH INTO THE LUNGS EVERY 4 HOURS AS NEEDED FOR WHEEZING  8.5 g  3  . SPIRIVA HANDIHALER 18 MCG inhalation capsule INHALE THE CONTENTS OF 1 CAPSULE VIA HANDIHALER ONCE DAILY  30 capsule  3  . SYMBICORT 160-4.5 MCG/ACT inhaler INHALE 2 PUFFS BY MOUTH INTO THE LUNGS TWICE DAILY  10.2 g  3  . temazepam (RESTORIL) 15 MG capsule Take 1 capsule (15 mg total) by mouth at bedtime as needed for sleep.  30 capsule  3  . warfarin (COUMADIN) 4 MG tablet TAKE 2 TABLETS BY MOUTH ON TUESDAY AND FRIDAY, TAKE 1 TABLET BY MOUTH ON SUNDAY, MONDAY, WEDNESDAY, THURSDAY, AND SATURDAY.  45 tablet  3  . brimonidine-timolol (COMBIGAN) 0.2-0.5 % ophthalmic solution Place 1 drop into both eyes every 12 (twelve) hours.        . travoprost, benzalkonium, (TRAVATAN Z) 0.004 % ophthalmic solution Place 1 drop into both eyes at bedtime.       Marland Kitchen warfarin (COUMADIN) 4 MG tablet Take 4-8 mg by mouth daily. Take 2 tablets (8 mg) on Tuesdays and Fridays, then take 1 tablet (4 mg) on Sundays, Mondays,Wednesdays,Thursdays,Saturdays       Assessment: 71yo male on chronic Coumadin for afib.  INR is below target.  Pt is admitted c/o SOB.    Goal of Therapy:  INR 2-3 Monitor platelets by anticoagulation protocol: Yes   Plan: Coumadin 7.5mg  today x 1 dose INR daily  Margo Aye, Scott A 05/15/2012,10:23 AM

## 2012-05-15 NOTE — Progress Notes (Signed)
INITIAL NUTRITION ASSESSMENT  DOCUMENTATION CODES Per approved criteria  -Not Applicable   INTERVENTION: Ensure Complete po BID, each supplement provides 350 kcal and 13 grams of protein.  NUTRITION DIAGNOSIS: None at this time  Goal: Pt to meet >/= 90% of their estimated nutrition needs  Monitor:  Meal and supplement intake  Reason for Assessment: Malnutrition Screen  72 y.o. male  Admitting Dx: HCAP (healthcare-associated pneumonia)  ASSESSMENT: Pt is 99% of UBW and is well nourished. He reports decreased appetite lately which is likely related to his PNA. At home he prepares his meals (likes hamburgers, soda and soups) and tells me he is not a breakfast eater. He was eating an ice cream during our visit. He is willing to drink an Ensure daily. He takes diuretics at home and reports MD goal wt for him 150-155#. Pt doesn't meet current criteria for malnutrition at this time. Will continue to follow and reassess as needed.  Nutrition Focused Physical Exam:  Subcutaneous Fat:  Orbital Region:well nourished Upper Arm Region: well nourished Thoracic and Lumbar Region: n/a  Muscle:  Temple Region: well nourished Clavicle Bone Region: well nourished Clavicle and Acromion Bone Region: well nourished Scapular Bone Region: n/a Dorsal Hand: well nourished Patellar Region: well nourished Anterior Thigh Region: n/a Posterior Calf Region: n/a  Edema: Mild RLE and LLE    Height: Ht Readings from Last 1 Encounters:  05/15/12 5\' 6"  (1.676 m)    Weight: Wt Readings from Last 1 Encounters:  05/15/12 149 lb 7.6 oz (67.8 kg)    Ideal Body Weight: 142% (64.5 kg)  % Ideal Body Weight: 105%  Wt Readings from Last 10 Encounters:  05/15/12 149 lb 7.6 oz (67.8 kg)  05/10/12 153 lb 1.9 oz (69.455 kg)  04/11/12 155 lb (70.308 kg)  04/03/12 157 lb 8 oz (71.442 kg)  04/02/12 156 lb 1.9 oz (70.816 kg)  03/29/12 160 lb (72.576 kg)  03/15/12 156 lb 11.2 oz (71.079 kg)  01/25/12  161 lb (73.029 kg)  10/18/11 162 lb 0.6 oz (73.501 kg)  10/15/11 159 lb (72.122 kg)    Usual Body Weight: 150-155#  % Usual Body Weight: 99%  BMI:  Body mass index is 24.14 kg/(m^2). Within normal range  Estimated Nutritional Needs: Kcal: 1700-2040  Protein: 75-88 gr Fluid: 1700 ml/day  Skin: no issues noted  Diet Order: Cardiac  EDUCATION NEEDS: -No education needs identified at this time   Intake/Output Summary (Last 24 hours) at 05/15/12 0841 Last data filed at 05/15/12 0738  Gross per 24 hour  Intake 281.51 ml  Output      0 ml  Net 281.51 ml    Last BM:  05/14/12  Labs:   Recent Labs Lab 05/15/12 0020 05/15/12 0423  NA 138 139  K 3.1* 2.8*  CL 94* 96  CO2 31 32  BUN 29* 29*  CREATININE 1.30 1.26  CALCIUM 9.4 8.8  GLUCOSE 126* 113*    CBG (last 3)  No results found for this basename: GLUCAP,  in the last 72 hours  Scheduled Meds: . albuterol  2.5 mg Nebulization TID  . antiseptic oral rinse  15 mL Mouth Rinse QID  . brimonidine  1 drop Both Eyes Q12H   And  . timolol  1 drop Both Eyes Q12H  . budesonide-formoterol  2 puff Inhalation BID  . ceFEPime (MAXIPIME) IV  1 g Intravenous Q8H  . [START ON 05/16/2012] furosemide  40 mg Oral Daily  . levalbuterol  0.63 mg  Nebulization QID  . levofloxacin (LEVAQUIN) IV  750 mg Intravenous Q24H  . lisinopril  5 mg Oral Daily  . montelukast  10 mg Oral QHS  . potassium chloride  40 mEq Oral TID  . sodium chloride  3 mL Intravenous Q12H  . sodium chloride  3 mL Intravenous Q12H  . tiotropium  18 mcg Inhalation Daily  . Travoprost (BAK Free)  1 drop Both Eyes QHS  . vancomycin  1,000 mg Intravenous Q12H    Continuous Infusions:   Past Medical History  Diagnosis Date  . Anemia     Hemoglobin 11.6 in 04/2008->resolved   . Edema     Lower Extremity  . Tobacco abuse   . Glaucoma(365)   . Allergic rhinitis   . Atrial fibrillation     Coumadin;asymptomatic bradycardia on telemetry in 12/2009  . CHF  (congestive heart failure)     H/o CHF with preserved LV EF; pulmonary hypertension; normal EF in 03/2009  . Hypertension   . Shingles   . Shortness of breath   . COPD (chronic obstructive pulmonary disease)   . Asthma     Past Surgical History  Procedure Laterality Date  . Bilateral cataract surgery    . Herniorrhapy    . Tendon repair      right hand surgical procedure for a tendon repair    Royann Shivers MS,RD,LDN,CSG Office: [phone] Pager: [phone]

## 2012-05-15 NOTE — ED Notes (Signed)
Patient using urinal

## 2012-05-15 NOTE — H&P (Signed)
[facility] History and Physical  [name] ZOX:[id number] DOB: 1940-07-17 DOA: 05/15/2012   PCP: Syliva Overman, MD  Specialists: Pulmonologist is Dr. Juanetta Gosling. He is followed by [facility] cardiology.  Chief Complaint: Shortness of breath for 2 days  HPI: Theodore [name] is a 72 y.o. male with a past medical history of atrial fibrillation, diastolic CHF, COPD, hypertension, noncompliance, who was recently hospitalized in January for CHF. Patient tells me that he has been short of breath for 2 days. He's had chills. Denies any fever. Has had a cough with clear expectoration. No chest pain. Has had a few episodes of emesis. Has been dizzy, but denies any syncopal episode. He tried taking inhalers and nebulizer treatments with no relief. Denies any palpitations. He says his wife and his son have had cold like symptoms in the last week or so. He admits to getting a flu shot this season. He was seen by Dr. Juanetta Gosling yesterday and was prescribed Z-Pak and he took 2 tablets. Since symptoms were not getting better and actually he was feeling worse he decided to come in to the hospital. Denies using home oxygen.  Home Medications: Prior to Admission medications   Medication Sig Start Date End Date Taking? Authorizing Provider  albuterol (PROVENTIL) (2.5 MG/3ML) 0.083% nebulizer solution Take 3 mLs (2.5 mg total) by nebulization 3 (three) times daily. 03/19/12  Yes Erick Blinks, MD  azithromycin (ZITHROMAX) 250 MG tablet Take 250 mg by mouth daily.   Yes Historical Provider, MD  furosemide (LASIX) 40 MG tablet Take 1 tablet (40 mg total) by mouth daily. 01/31/12  Yes Kerri Perches, MD  lisinopril (PRINIVIL,ZESTRIL) 5 MG tablet Take 1 tablet (5 mg total) by mouth daily. 03/22/12  Yes Kerri Perches, MD  montelukast (SINGULAIR) 10 MG tablet Take 10 mg by mouth at bedtime.   Yes Historical Provider, MD  potassium chloride (K-DUR) 10 MEQ tablet TAKE 1 TABLET BY MOUTH EVERY DAY AFTER BREAKFAST **  D/C TABLETS 05/13/12  Yes Kerri Perches, MD  PROAIR HFA 108 (90 BASE) MCG/ACT inhaler INHALE 2 PUFFS BY MOUTH INTO THE LUNGS EVERY 4 HOURS AS NEEDED FOR WHEEZING 05/13/12  Yes Kerri Perches, MD  SPIRIVA HANDIHALER 18 MCG inhalation capsule INHALE THE CONTENTS OF 1 CAPSULE VIA HANDIHALER ONCE DAILY 05/13/12  Yes Kerri Perches, MD  SYMBICORT 160-4.5 MCG/ACT inhaler INHALE 2 PUFFS BY MOUTH INTO THE LUNGS TWICE DAILY 05/13/12  Yes Kerri Perches, MD  temazepam (RESTORIL) 15 MG capsule Take 1 capsule (15 mg total) by mouth at bedtime as needed for sleep. 04/02/12  Yes Kerri Perches, MD  warfarin (COUMADIN) 4 MG tablet TAKE 2 TABLETS BY MOUTH ON TUESDAY AND FRIDAY, TAKE 1 TABLET BY MOUTH ON SUNDAY, MONDAY, WEDNESDAY, THURSDAY, AND SATURDAY. 05/13/12  Yes Kathlen Brunswick, MD  brimonidine-timolol (COMBIGAN) 0.2-0.5 % ophthalmic solution Place 1 drop into both eyes every 12 (twelve) hours.      Historical Provider, MD  travoprost, benzalkonium, (TRAVATAN Z) 0.004 % ophthalmic solution Place 1 drop into both eyes at bedtime.     Historical Provider, MD  warfarin (COUMADIN) 4 MG tablet Take 4-8 mg by mouth daily. Take 2 tablets (8 mg) on Tuesdays and Fridays, then take 1 tablet (4 mg) on Sundays, Mondays,Wednesdays,Thursdays,Saturdays 05/24/11   Kathlen Brunswick, MD    Allergies: No Known Allergies  Past Medical History: Past Medical History  Diagnosis Date  . Anemia     Hemoglobin 11.6 in 04/2008->resolved   .  Edema     Lower Extremity  . Tobacco abuse   . Glaucoma(365)   . Allergic rhinitis   . Atrial fibrillation     Coumadin;asymptomatic bradycardia on telemetry in 12/2009  . CHF (congestive heart failure)     H/o CHF with preserved LV EF; pulmonary hypertension; normal EF in 03/2009  . Hypertension   . Shingles   . Shortness of breath   . COPD (chronic obstructive pulmonary disease)   . Asthma     Past Surgical History  Procedure Laterality Date  . Bilateral cataract  surgery    . Herniorrhapy    . Tendon repair      right hand surgical procedure for a tendon repair    Social History:  reports that he quit smoking about 3 months ago. His smoking use included Cigarettes. He has a 27.5 pack-year smoking history. He has never used smokeless tobacco. He reports that he does not drink alcohol or use illicit drugs.  Living Situation: Lives in [place] with his wife Activity Level: Usually independent with daily activities   Family History:  Family History  Problem Relation Age of Onset  . Cancer Mother   . Cancer Father   . Coronary artery disease Brother      Review of Systems - History obtained from the patient General ROS: positive for  - fatigue Psychological ROS: negative Ophthalmic ROS: negative ENT ROS: negative Allergy and Immunology ROS: negative Hematological and Lymphatic ROS: negative Endocrine ROS: negative Respiratory ROS: as in hpi Cardiovascular ROS: no chest pain or dyspnea on exertion Gastrointestinal ROS: no abdominal pain, change in bowel habits, or black or bloody stools Genito-Urinary ROS: no dysuria, trouble voiding, or hematuria Musculoskeletal ROS: negative Neurological ROS: no TIA or stroke symptoms Dermatological ROS: negative  Physical Examination  Filed Vitals:   05/15/12 0100 05/15/12 0115 05/15/12 0130 05/15/12 0145  BP: 119/73     Pulse:  37 82 36  Temp:      TempSrc:      Resp:      Height:      Weight:      SpO2:  84% 82% 84%    General appearance: alert, cooperative, appears stated age and no distress Head: Normocephalic, without obvious abnormality, atraumatic Eyes: conjunctivae/corneas clear. PERRL, EOM's intact.  Throat: lips, mucosa, and tongue normal; teeth and gums normal Neck: no adenopathy, no carotid bruit, no JVD, supple, symmetrical, trachea midline and thyroid not enlarged, symmetric, no tenderness/mass/nodules Back: symmetric, no curvature. ROM normal. No CVA tenderness. Resp:  Rhonchi in the right base. He's got a few crackles. Both the bases. No wheezing is appreciated. Cardio: S1-S2 is irregularly irregular. Tachycardic. No S3, S4. No rubs, murmurs, or bruit. No pedal edema. GI: soft, non-tender; bowel sounds normal; no masses,  no organomegaly Extremities: extremities normal, atraumatic, no cyanosis or edema Pulses: 2+ and symmetric Skin: Skin color, texture, turgor normal. No rashes or lesions Lymph nodes: Cervical, supraclavicular, and axillary nodes normal. Neurologic: He is alert and oriented x3. No focal neurological deficits are present  Laboratory Data: Results for orders placed during the hospital encounter of 05/15/12 (from the past 48 hour(s))  CBC     Status: Abnormal   Collection Time    05/15/12 12:20 AM      Result Value Range   WBC 12.1 (*) 4.0 - 10.5 K/uL   RBC 3.98 (*) 4.22 - 5.81 MIL/uL   Hemoglobin 11.5 (*) 13.0 - 17.0 g/dL   HCT 19.1 (*) 47.8 -  52.0 %   MCV 90.5  78.0 - 100.0 fL   MCH 28.9  26.0 - 34.0 pg   MCHC 31.9  30.0 - 36.0 g/dL   RDW 96.0  45.4 - 09.8 %   Platelets 213  150 - 400 K/uL  PRO B NATRIURETIC PEPTIDE     Status: Abnormal   Collection Time    05/15/12 12:20 AM      Result Value Range   Pro B Natriuretic peptide (BNP) 4353.0 (*) 0 - 125 pg/mL  BASIC METABOLIC PANEL     Status: Abnormal   Collection Time    05/15/12 12:20 AM      Result Value Range   Sodium 138  135 - 145 mEq/L   Potassium 3.1 (*) 3.5 - 5.1 mEq/L   Chloride 94 (*) 96 - 112 mEq/L   CO2 31  19 - 32 mEq/L   Glucose, Bld 126 (*) 70 - 99 mg/dL   BUN 29 (*) 6 - 23 mg/dL   Creatinine, Ser 1.19  0.50 - 1.35 mg/dL   Calcium 9.4  8.4 - 14.7 mg/dL   GFR calc non Af Amer 53 (*) >90 mL/min   GFR calc Af Amer 62 (*) >90 mL/min   Comment:            The eGFR has been calculated     using the CKD EPI equation.     This calculation has not been     validated in all clinical     situations.     eGFR's persistently     <90 mL/min signify     possible  Chronic Kidney Disease.  TROPONIN I     Status: None   Collection Time    05/15/12 12:20 AM      Result Value Range   Troponin I <0.30  <0.30 ng/mL   Comment:            Due to the release kinetics of cTnI,     a negative result within the first hours     of the onset of symptoms does not rule out     myocardial infarction with certainty.     If myocardial infarction is still suspected,     repeat the test at appropriate intervals.  PROTIME-INR     Status: Abnormal   Collection Time    05/15/12 12:20 AM      Result Value Range   Prothrombin Time 18.8 (*) 11.6 - 15.2 seconds   INR 1.63 (*) 0.00 - 1.49  CULTURE, BLOOD (ROUTINE X 2)     Status: None   Collection Time    05/15/12 12:30 AM      Result Value Range   Specimen Description BLOOD RIGHT ANTECUBITAL     Special Requests       Value: BOTTLES DRAWN AEROBIC AND ANAEROBIC AEB 8CC ANA 6CC   Culture PENDING     Report Status PENDING    CULTURE, BLOOD (ROUTINE X 2)     Status: None   Collection Time    05/15/12 12:31 AM      Result Value Range   Specimen Description BLOOD LEFT HAND     Special Requests BOTTLES DRAWN AEROBIC ONLY 5CC     Culture PENDING     Report Status PENDING    BLOOD GAS, ARTERIAL     Status: Abnormal   Collection Time    05/15/12  1:45 AM      Result Value Range  O2 Content 4.0     Delivery systems NASAL CANNULA     pH, Arterial 7.415  7.350 - 7.450   pCO2 arterial 46.9 (*) 35.0 - 45.0 mmHg   pO2, Arterial 104.0 (*) 80.0 - 100.0 mmHg   Bicarbonate 29.4 (*) 20.0 - 24.0 mEq/L   TCO2 26.8  0 - 100 mmol/L   Acid-Base Excess 5.0 (*) 0.0 - 2.0 mmol/L   O2 Saturation 98.9     Patient temperature 37.0     Collection site RIGHT RADIAL     Drawn by [id number]     Sample type ARTERIAL     Allens test (pass/fail) PASS  PASS    Radiology Reports: Dg Chest Portable 1 View  05/15/2012  *RADIOLOGY REPORT*  Clinical Data: Cough, shortness of breath, chest pain, fever and chills.  History of smoking and asthma.   PORTABLE CHEST - 1 VIEW  Comparison: Chest radiograph performed 03/29/2012, and CTA of the chest performed 03/18/2011  Findings: The lungs are well-aerated.  Mild right midlung and right basilar airspace opacity could reflect mild pneumonia.  There is no evidence of pleural effusion or pneumothorax.  Pulmonary vascularity is at the upper limits of normal.  The cardiomediastinal silhouette is mildly enlarged.  No acute osseous abnormalities are seen.  IMPRESSION:  1.  Mild right midlung and right basilar airspace opacity could reflect mild pneumonia. 2.  Mild cardiomegaly.   Original Report Authenticated By: Tonia Ghent, M.D.     Electrocardiogram: EKG shows irregular rhythm. It's atrial fibrillation at 133 beats per minute. Left axis deviation. There is T inversion in aVL which appears to be new and could be rate related. No acute ST changes are noted.  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 He has slightly elevated white blood cell count. He was hospitalized within the last 90 days. This will be considered Healthcare associated pneumonia. Differential diagnoses includes influenza. CHF is also a differential diagnosis although less likely.  Plan: #1 healthcare associated pneumonia: He'll be treated with broad-spectrum antibiotics. Influenza, PCR will be checked. Droplet precautions will be utilized for now. Oxygen will be provided. His saturations are not getting recorded accurately so, ABG will be ordered. Nebulizer treatments will be given as needed. He  has already been given a dose of Lasix intravenously.  #2 atrial fibrillation with rapid ventricular response: Likely due to his acute respiratory failure. He denies missing any of his home doses. Cardizem infusion has been initiated, which will be continued. Once his respiratory status is stable he can be transitioned to oral agents.  #3 on chronic anticoagulation for atrial fibrillation: Continue with warfarin per pharmacy.  #4 history of diastolic CHF with preserved systolic function: Appears to be euvolemic, maybe even slightly dry, despite elevated bnp values. Has been given IV Lasix by the ED physician. Resume his oral Lasix from morning.  #5 history of COPD: Breathing treatments as needed. Continue to monitor.  #6 hypokalemia: This will be repleted orally.  DVT Prophylaxis: He is on warfarin Code Status: Full code Family Communication: Discussed with the patient  Disposition Plan: Likely  return home when improved   Further management decisions will depend on results of further testing and patient's response to treatment.  [facility]  [facility] Pager [phone]  If 7PM-7AM, please contact night-coverage [email or web address] Password [email or web address]  05/15/2012, 1:55 AM

## 2012-05-15 NOTE — ED Provider Notes (Signed)
History     CSN: [id number]  Arrival date & time 05/15/12  0006   First MD Initiated Contact with Patient 05/15/12 0017      Chief Complaint  Patient presents with  . Shortness of Breath    (Consider location/radiation/quality/duration/timing/severity/associated sxs/prior treatment) HPI Daelon E Ezekiel is a 72 y.o. male past medical history significant for congestive heart failure, atrial fibrillation on chronic Coumadin therapy, COPD and up until 3 months ago smoker, asthma, hypertension presents with shortness of breath, cough, wheezing, fevers and chills for 3 days. The symptoms have been worsening, they're currently severe, he was prescribed azithromycin and took a single dose of that today. Patient did not take a dose of his Lasix today. Review of systems is positive for nausea and vomiting, denies diarrhea, blood in stools, abdominal pain, headache, dizziness, lightheadedness, numbness or tingling.    Past Medical History  Diagnosis Date  . Anemia     Hemoglobin 11.6 in 04/2008->resolved   . Edema     Lower Extremity  . Tobacco abuse   . Glaucoma(365)   . Allergic rhinitis   . Atrial fibrillation     Coumadin;asymptomatic bradycardia on telemetry in 12/2009  . CHF (congestive heart failure)     H/o CHF with preserved LV EF; pulmonary hypertension; normal EF in 03/2009  . Hypertension   . Shingles   . Shortness of breath   . COPD (chronic obstructive pulmonary disease)   . Asthma     Past Surgical History  Procedure Laterality Date  . Bilateral cataract surgery    . Herniorrhapy    . Tendon repair      right hand surgical procedure for a tendon repair    Family History  Problem Relation Age of Onset  . Cancer Mother   . Cancer Father   . Coronary artery disease Brother     History  Substance Use Topics  . Smoking status: Former Smoker -- 0.50 packs/day for 55 years    Types: Cigarettes    Quit date: 02/13/2012  . Smokeless tobacco: Never Used  . Alcohol  Use: No      Review of Systems At least 10pt or greater review of systems completed and are negative except where specified in the HPI.  Allergies  Review of patient's allergies indicates no known allergies.  Home Medications   Current Outpatient Rx  Name  Route  Sig  Dispense  Refill  . albuterol (PROVENTIL) (2.5 MG/3ML) 0.083% nebulizer solution   Nebulization   Take 3 mLs (2.5 mg total) by nebulization 3 (three) times daily.   75 mL   1   . azithromycin (ZITHROMAX) 250 MG tablet   Oral   Take 250 mg by mouth daily.         . furosemide (LASIX) 40 MG tablet   Oral   Take 1 tablet (40 mg total) by mouth daily.   60 tablet   3   . lisinopril (PRINIVIL,ZESTRIL) 5 MG tablet   Oral   Take 1 tablet (5 mg total) by mouth daily.   30 tablet   1   . montelukast (SINGULAIR) 10 MG tablet   Oral   Take 10 mg by mouth at bedtime.         . potassium chloride (K-DUR) 10 MEQ tablet      TAKE 1 TABLET BY MOUTH EVERY DAY AFTER BREAKFAST ** D/C TABLETS   30 tablet   3   . PROAIR HFA 108 (90  BASE) MCG/ACT inhaler      INHALE 2 PUFFS BY MOUTH INTO THE LUNGS EVERY 4 HOURS AS NEEDED FOR WHEEZING   8.5 g   3   . SPIRIVA HANDIHALER 18 MCG inhalation capsule      INHALE THE CONTENTS OF 1 CAPSULE VIA HANDIHALER ONCE DAILY   30 capsule   3   . SYMBICORT 160-4.5 MCG/ACT inhaler      INHALE 2 PUFFS BY MOUTH INTO THE LUNGS TWICE DAILY   10.2 g   3   . temazepam (RESTORIL) 15 MG capsule   Oral   Take 1 capsule (15 mg total) by mouth at bedtime as needed for sleep.   30 capsule   3   . warfarin (COUMADIN) 4 MG tablet      TAKE 2 TABLETS BY MOUTH ON TUESDAY AND FRIDAY, TAKE 1 TABLET BY MOUTH ON SUNDAY, MONDAY, WEDNESDAY, THURSDAY, AND SATURDAY.   45 tablet   3   . brimonidine-timolol (COMBIGAN) 0.2-0.5 % ophthalmic solution   Both Eyes   Place 1 drop into both eyes every 12 (twelve) hours.           . travoprost, benzalkonium, (TRAVATAN Z) 0.004 %  ophthalmic solution   Both Eyes   Place 1 drop into both eyes at bedtime.          Marland Kitchen warfarin (COUMADIN) 4 MG tablet   Oral   Take 4-8 mg by mouth daily. Take 2 tablets (8 mg) on Tuesdays and Fridays, then take 1 tablet (4 mg) on Sundays, Mondays,Wednesdays,Thursdays,Saturdays           BP 106/65  Pulse 138  Temp(Src) 101.1 F (38.4 C) (Oral)  Resp 30  Ht 5\' 6"  (1.676 m)  Wt 150 lb (68.04 kg)  BMI 24.22 kg/m2  SpO2 100%  Physical Exam  Nursing notes reviewed.  Electronic medical record reviewed. VITAL SIGNS:   Filed Vitals:   05/15/12 0016  BP: 106/65  Pulse: 138  Temp: 101.1 F (38.4 C)  TempSrc: Oral  Resp: 30  Height: 5\' 6"  (1.676 m)  Weight: 150 lb (68.04 kg)  SpO2: 100%   CONSTITUTIONAL: Awake, oriented x4, appears to be having respiratory difficulty - can complete short sentences only before needing a breath HENT: Atraumatic, normocephalic, oral mucosa pink and moist, airway patent. Nares patent without drainage. External ears normal. EYES: Conjunctiva clear, EOMI, PERRLA NECK: Trachea midline, non-tender, supple CARDIOVASCULAR: Tachycardic rate, irregularly irregular rhythm PULMONARY/CHEST: Diminished breath sounds bilaterally, rales to mid field, wheezing throughout loudest in the expiratory phase which is prolonged. Non-tender. ABDOMINAL: Non-distended, soft, non-tender - no rebound or guarding.  BS normal. NEUROLOGIC: Non-focal, moving all four extremities, no gross sensory or motor deficits. EXTREMITIES: No clubbing, cyanosis. 1+ lower extremity pitting edema SKIN: Warm, Dry, No erythema, No rash  ED Course  CRITICAL CARE Performed by: Jones Skene Authorized by: Jones Skene Total critical care time: 30 minutes Critical care time was exclusive of separately billable procedures and treating other patients. Critical care was necessary to treat or prevent imminent or life-threatening deterioration of the following conditions: Adult pneumonia,  hypoxia, atrial fibrillation with rapid ventricular response on Cardizem drip. Critical care was time spent personally by me on the following activities: discussions with consultants, evaluation of patient's response to treatment, obtaining history from patient or surrogate, ordering and review of laboratory studies, pulse oximetry, re-evaluation of patient's condition, ordering and review of radiographic studies, ordering and performing treatments and interventions and examination of patient.   (  including critical care time)  Date: 05/15/2012  Rate: 133  Rhythm: atrial fibrillation with rapid ventricular response  QRS Axis: left  Intervals: normal  ST/T Wave abnormalities: new flipped T-waves in I/aVL since 03/29/2012  Conduction Disutrbances: none  Narrative Interpretation: Atrial fibrillation seen on prior EKG, this time he is in rapid ventricular response, patient has new flipped T waves in leads 1 and aVL, no ST elevations, frequent PVCs.  Labs Reviewed  CBC - Abnormal; Notable for the following:    WBC 12.1 (*)    RBC 3.98 (*)    Hemoglobin 11.5 (*)    HCT 36.0 (*)    All other components within normal limits  PRO B NATRIURETIC PEPTIDE - Abnormal; Notable for the following:    Pro B Natriuretic peptide (BNP) 4353.0 (*)    All other components within normal limits  BASIC METABOLIC PANEL - Abnormal; Notable for the following:    Potassium 3.1 (*)    Chloride 94 (*)    Glucose, Bld 126 (*)    BUN 29 (*)    GFR calc non Af Amer 53 (*)    GFR calc Af Amer 62 (*)    All other components within normal limits  PROTIME-INR - Abnormal; Notable for the following:    Prothrombin Time 18.8 (*)    INR 1.63 (*)    All other components within normal limits  CULTURE, BLOOD (ROUTINE X 2)  CULTURE, BLOOD (ROUTINE X 2)  TROPONIN I  BLOOD GAS, ARTERIAL   Dg Chest Portable 1 View  05/15/2012  *RADIOLOGY REPORT*  Clinical Data: Cough, shortness of breath, chest pain, fever and chills.  History  of smoking and asthma.  PORTABLE CHEST - 1 VIEW  Comparison: Chest radiograph performed 03/29/2012, and CTA of the chest performed 03/18/2011  Findings: The lungs are well-aerated.  Mild right midlung and right basilar airspace opacity could reflect mild pneumonia.  There is no evidence of pleural effusion or pneumothorax.  Pulmonary vascularity is at the upper limits of normal.  The cardiomediastinal silhouette is mildly enlarged.  No acute osseous abnormalities are seen.  IMPRESSION:  1.  Mild right midlung and right basilar airspace opacity could reflect mild pneumonia. 2.  Mild cardiomegaly.   Original Report Authenticated By: Tonia Ghent, M.D.      1. Community acquired pneumonia   2. Diastolic CHF with preserved left ventricular function, NYHA class 4   3. Hypoxia   4. COPD (chronic obstructive pulmonary disease)   5. Atrial fibrillation with rapid ventricular response       MDM  Kethan E Gossett is a 72 y.o. male with extensive medical history presents with afib w/ RVR, SOB, and was just started on Azithromycin today.  Concern for community acquired pneumonia exacerbating afib/COPD.  Patient did not take his Lasix today also also mildly volume overloaded. Give the patient a breathing treatment, give him antibiotics, after obtaining cultures. Patient also start a Cardizem drip and titrated to a normal heart rate - at that time also obtain EKG looking for any further changes. Patient does have ischemic changes in high lateral leads concerning for rate related response.  Patient is febrile, is on no rate controlling medications, tachycardia could be secondary to fever. I do not think the patient is euvolemic or dehydrated, patient does have some 1+ lower extremity edema and bilateral rales.  His chest x-ray is not impressive for overall pulmonary edema picture, there is an evident right lower lobe pneumonia.  Patient has  a white count of 12.1, H&H 11.5 and 36. BUN and creatinine are slightly  elevated above patient's baseline. Troponin is negative, BNP is elevated.  Think there is a complex interplay of his various chronic disease processes including his COPD, congestive heart failure, atrial fibrillation and community acquired pneumonia - started Rocephin and azithromycin, he's had one dose of azithromycin I do not think this qualifies him for healthcare associated pneumonia. Discussed this with Dr. Rito Ehrlich, patient will be admitted to step down.  Patient stable           Jones Skene, MD 05/15/12 [phone]

## 2012-05-15 NOTE — ED Notes (Signed)
Pt c/o sob, cough, wheezing, fever, chills, denies chest pain

## 2012-05-15 NOTE — Progress Notes (Signed)
Chart reviewed. Patient examined. Patient admitted after midnight. Heart rate currently 80. Previously was 50 and Cardizem drip was stopped by nursing staff. Oxygen saturations in the mid-to high 80s. Will give a nebulizer treatment, continue antibiotics. Titrate oxygen.

## 2012-05-16 ENCOUNTER — Inpatient Hospital Stay (HOSPITAL_COMMUNITY): Payer: Medicare Other

## 2012-05-16 DIAGNOSIS — J449 Chronic obstructive pulmonary disease, unspecified: Secondary | ICD-10-CM

## 2012-05-16 LAB — LEGIONELLA ANTIGEN, URINE: Legionella Antigen, Urine: NEGATIVE

## 2012-05-16 LAB — BASIC METABOLIC PANEL
BUN: 22 mg/dL (ref 6–23)
Creatinine, Ser: 0.98 mg/dL (ref 0.50–1.35)
GFR calc non Af Amer: 80 mL/min — ABNORMAL LOW (ref >90)
Glucose, Bld: 93 mg/dL (ref 70–99)
Potassium: 4.7 mEq/L (ref 3.5–5.1)

## 2012-05-16 LAB — PROTIME-INR: Prothrombin Time: 19 seconds — ABNORMAL HIGH (ref 11.6–15.2)

## 2012-05-16 LAB — CLOSTRIDIUM DIFFICILE BY PCR: Toxigenic C. Difficile by PCR: NEGATIVE

## 2012-05-16 MED ORDER — BRIMONIDINE TARTRATE 0.2 % OP SOLN
1.0000 [drp] | OPHTHALMIC | Status: DC
  Administered 2012-05-16 – 2012-05-20 (×9): 1 [drp] via OPHTHALMIC
  Filled 2012-05-16: qty 5

## 2012-05-16 MED ORDER — TIMOLOL MALEATE 0.5 % OP SOLN
1.0000 [drp] | OPHTHALMIC | Status: DC
  Administered 2012-05-16 – 2012-05-19 (×8): 1 [drp] via OPHTHALMIC
  Filled 2012-05-16: qty 5

## 2012-05-16 MED ORDER — GUAIFENESIN ER 600 MG PO TB12
1200.0000 mg | ORAL_TABLET | Freq: Two times a day (BID) | ORAL | Status: DC
  Administered 2012-05-16 – 2012-05-20 (×10): 1200 mg via ORAL
  Filled 2012-05-16 (×10): qty 2

## 2012-05-16 MED ORDER — WARFARIN SODIUM 7.5 MG PO TABS
7.5000 mg | ORAL_TABLET | Freq: Once | ORAL | Status: AC
  Administered 2012-05-16: 7.5 mg via ORAL
  Filled 2012-05-16: qty 1

## 2012-05-16 MED ORDER — INDOMETHACIN 25 MG PO CAPS
50.0000 mg | ORAL_CAPSULE | Freq: Once | ORAL | Status: AC
  Administered 2012-05-16: 50 mg via ORAL
  Filled 2012-05-16: qty 2

## 2012-05-16 MED ORDER — PREDNISONE 20 MG PO TABS
40.0000 mg | ORAL_TABLET | Freq: Once | ORAL | Status: AC
  Administered 2012-05-16: 40 mg via ORAL
  Filled 2012-05-16: qty 2

## 2012-05-16 NOTE — Progress Notes (Signed)
Notified MD of right foot pain rating 8/10 after giving 650 mg of acetaminophen. Also patient has had 2 watery stools and MD put in order to check for c-diff.

## 2012-05-16 NOTE — Plan of Care (Signed)
Problem: Phase II Progression Outcomes Goal: Wean O2 if indicated Outcome: Progressing Patient remains on 2liters of oxygen Goal: Discharge plan established Outcome: Progressing Home with spouse

## 2012-05-16 NOTE — Progress Notes (Signed)
Nurse alerted by centralized tele that patient had 8 beat run of v.tach.  BP and pulse stable and patient asymptomatic.  MD paged to make aware - no orders given

## 2012-05-16 NOTE — Progress Notes (Signed)
Family was notified of transfer.

## 2012-05-16 NOTE — Progress Notes (Addendum)
Subjective: Still short of breath. Feels like "fluid building up" because his abdomen feels tight. He's having bowel movements. Appetite is still poor. Nursing staff reports that his oxygen saturations are not reading correctly on the monitor, but that his saturation with a free standing pulse oximeter is 100% on supplemental oxygen.   Objective: Vital signs in last 24 hours: Filed Vitals:   05/16/12 0400 05/16/12 0500 05/16/12 0600 05/16/12 0718  BP: 120/69 105/69 116/77   Pulse: 99 84 34   Temp: 98 F (36.7 C)   99 F (37.2 C)  TempSrc: Oral   Oral  Resp: 27 22 26    Height:      Weight:  79.6 kg (175 lb 7.8 oz)    SpO2: 88% 93% 75%    Weight change: 11.56 kg (25 lb 7.8 oz)  Intake/Output Summary (Last 24 hours) at 05/16/12 0802 Last data filed at 05/16/12 0600  Gross per 24 hour  Intake   1760 ml  Output    450 ml  Net   1310 ml   Oxygen saturation 100%.  General: Alert. Appears comfortable at rest but with talking, slightly short of breath. Lungs: Clear to auscultation bilaterally without wheeze rhonchi or rales Cardiovascular irregularly irregular Abdomen soft nontender nondistended Extremities no clubbing cyanosis or edema  Lab Results: Basic Metabolic Panel:  Recent Labs Lab 05/15/12 0423 05/16/12 0443  NA 139 136  K 2.8* 4.7  CL 96 98  CO2 32 30  GLUCOSE 113* 93  BUN 29* 22  CREATININE 1.26 0.98  CALCIUM 8.8 9.2  MG 1.8  --    Liver Function Tests:  Recent Labs Lab 05/15/12 0423  AST 23  ALT 10  ALKPHOS 65  BILITOT 1.1  PROT 7.1  ALBUMIN 2.9*   No results found for this basename: LIPASE, AMYLASE,  in the last 168 hours No results found for this basename: AMMONIA,  in the last 168 hours CBC:  Recent Labs Lab 05/15/12 0020 05/15/12 0423  WBC 12.1* 13.4*  HGB 11.5* 9.9*  HCT 36.0* 31.4*  MCV 90.5 90.8  PLT 213 212   Cardiac Enzymes:  Recent Labs Lab 05/15/12 0020  TROPONINI <0.30   BNP:  Recent Labs Lab 05/15/12 0020   PROBNP 4353.0*   Micro Results: Recent Results (from the past 240 hour(s))  CULTURE, BLOOD (ROUTINE X 2)     Status: None   Collection Time    05/15/12 12:30 AM      Result Value Range Status   Specimen Description BLOOD RIGHT ANTECUBITAL   Final   Special Requests     Final   Value: BOTTLES DRAWN AEROBIC AND ANAEROBIC AEB 8CC ANA 6CC   Culture NO GROWTH <24 HRS   Final   Report Status PENDING   Incomplete  CULTURE, BLOOD (ROUTINE X 2)     Status: None   Collection Time    05/15/12 12:31 AM      Result Value Range Status   Specimen Description BLOOD LEFT HAND   Final   Special Requests BOTTLES DRAWN AEROBIC ONLY 5CC   Final   Culture NO GROWTH <24 HRS   Final   Report Status PENDING   Incomplete  MRSA PCR SCREENING     Status: None   Collection Time    05/15/12  3:49 AM      Result Value Range Status   MRSA by PCR NEGATIVE  NEGATIVE Final   Comment:  The GeneXpert MRSA Assay (FDA     approved for NASAL specimens     only), is one component of a     comprehensive MRSA colonization     surveillance program. It is not     intended to diagnose MRSA     infection nor to guide or     monitor treatment for     MRSA infections.   Studies/Results: Dg Chest Portable 1 View  05/15/2012  *RADIOLOGY REPORT*  Clinical Data: Cough, shortness of breath, chest pain, fever and chills.  History of smoking and asthma.  PORTABLE CHEST - 1 VIEW  Comparison: Chest radiograph performed 03/29/2012, and CTA of the chest performed 03/18/2011  Findings: The lungs are well-aerated.  Mild right midlung and right basilar airspace opacity could reflect mild pneumonia.  There is no evidence of pleural effusion or pneumothorax.  Pulmonary vascularity is at the upper limits of normal.  The cardiomediastinal silhouette is mildly enlarged.  No acute osseous abnormalities are seen.  IMPRESSION:  1.  Mild right midlung and right basilar airspace opacity could reflect mild pneumonia. 2.  Mild cardiomegaly.    Original Report Authenticated By: Tonia Ghent, M.D.    Scheduled Meds: . antiseptic oral rinse  15 mL Mouth Rinse QID  . brimonidine  1 drop Both Eyes Custom   And  . timolol  1 drop Both Eyes Custom  . budesonide-formoterol  2 puff Inhalation BID  . ceFEPime (MAXIPIME) IV  1 g Intravenous Q8H  . feeding supplement  237 mL Oral BID BM  . furosemide  40 mg Oral Daily  . guaiFENesin  1,200 mg Oral BID  . levalbuterol  0.63 mg Nebulization QID  . levofloxacin (LEVAQUIN) IV  750 mg Intravenous Q24H  . lisinopril  5 mg Oral Daily  . montelukast  10 mg Oral QHS  . sodium chloride  3 mL Intravenous Q12H  . sodium chloride  3 mL Intravenous Q12H  . tiotropium  18 mcg Inhalation Daily  . Travoprost (BAK Free)  1 drop Both Eyes QHS  . vancomycin  1,000 mg Intravenous Q12H  . warfarin  7.5 mg Oral ONCE-1800  . Warfarin - Pharmacist Dosing Inpatient   Does not apply q1800   Continuous Infusions:  PRN Meds:.sodium chloride, acetaminophen, acetaminophen, albuterol, metoprolol, ondansetron (ZOFRAN) IV, ondansetron, sodium chloride, temazepam Assessment/Plan: Principal Problem:   HCAP (healthcare-associated pneumonia): Continue current antibiotic regimen.    Acute respiratory failure: Sats normal on supplemental oxygen, but still mildly tachypneic.    HYPERTENSION: Continue outpatient regimen    COPD (chronic obstructive pulmonary disease): No wheezing currently.   Chronic Diastolic CHF with preserved left ventricular function, NYHA class 4: Patient has been on his home Lasix dose, but will repeat BNP and chest x-ray to rule out an acute component    Atrial fibrillation with rapid ventricular response: Now rate controlled    Long term current use of anticoagulant: Coumadin per pharmacy    Hypokalemia resolved   as he has been off the Cardizem drip for 24 hours, will transfer out of step down, to telemetry unit.   LOS: 1 day   SULLIVAN,CORINNA L 05/16/2012, 8:02  AM  Addendum:  CXR and pro BNP about the same.  Has developed loose stool.  Will check for c diff.  Also c/o gout flare in right MTP.  Mildly tender, not red or warm.  Will give indomethacin x1 prednisone x1  Crista Curb, M.D.

## 2012-05-16 NOTE — Progress Notes (Signed)
ANTICOAGULATION CONSULT NOTE   Pharmacy Consult for Coumadin Indication: atrial fibrillation  No Known Allergies  Patient Measurements: Height: 5\' 6"  (167.6 cm) Weight: 175 lb 7.8 oz (79.6 kg) IBW/kg (Calculated) : 63.8  Vital Signs: Temp: 99 F (37.2 C) (03/06 0718) Temp src: Oral (03/06 0718) BP: 116/77 mmHg (03/06 0600) Pulse Rate: 34 (03/06 0600)  Labs:  Recent Labs  05/15/12 0020 05/15/12 0423 05/16/12 0443  HGB 11.5* 9.9*  --   HCT 36.0* 31.4*  --   PLT 213 212  --   LABPROT 18.8*  --  19.0*  INR 1.63*  --  1.65*  CREATININE 1.30 1.26 0.98  TROPONINI <0.30  --   --    Estimated Creatinine Clearance: 67.6 ml/min (by C-G formula based on Cr of 0.98).  Medical History: Past Medical History  Diagnosis Date  . Anemia     Hemoglobin 11.6 in 04/2008->resolved   . Edema     Lower Extremity  . Tobacco abuse   . Glaucoma(365)   . Allergic rhinitis   . Atrial fibrillation     Coumadin;asymptomatic bradycardia on telemetry in 12/2009  . CHF (congestive heart failure)     H/o CHF with preserved LV EF; pulmonary hypertension; normal EF in 03/2009  . Hypertension   . Shingles   . Shortness of breath   . COPD (chronic obstructive pulmonary disease)   . Asthma    Medications:  Prescriptions prior to admission  Medication Sig Dispense Refill  . albuterol (PROVENTIL HFA;VENTOLIN HFA) 108 (90 BASE) MCG/ACT inhaler Inhale 2 puffs into the lungs every 4 (four) hours as needed for wheezing or shortness of breath.      Marland Kitchen albuterol (PROVENTIL) (2.5 MG/3ML) 0.083% nebulizer solution Take 3 mLs (2.5 mg total) by nebulization 3 (three) times daily.  75 mL  1  . azithromycin (ZITHROMAX Z-PAK) 250 MG tablet Take 250 mg by mouth daily. Started on 05/13/12 Patient took 2 tablets then takes 1 tablet daily for 4 days      . brimonidine-timolol (COMBIGAN) 0.2-0.5 % ophthalmic solution Place 1 drop into both eyes every 12 (twelve) hours.        . budesonide-formoterol (SYMBICORT) 160-4.5  MCG/ACT inhaler Inhale 2 puffs into the lungs 2 (two) times daily.      . furosemide (LASIX) 40 MG tablet Take 1 tablet (40 mg total) by mouth daily.  60 tablet  3  . lisinopril (PRINIVIL,ZESTRIL) 5 MG tablet Take 1 tablet (5 mg total) by mouth daily.  30 tablet  1  . montelukast (SINGULAIR) 10 MG tablet Take 10 mg by mouth at bedtime.      . potassium chloride (K-DUR) 10 MEQ tablet Take 10 mEq by mouth daily.      . temazepam (RESTORIL) 15 MG capsule Take 1 capsule (15 mg total) by mouth at bedtime as needed for sleep.  30 capsule  3  . tiotropium (SPIRIVA) 18 MCG inhalation capsule Place 18 mcg into inhaler and inhale daily.      . travoprost, benzalkonium, (TRAVATAN Z) 0.004 % ophthalmic solution Place 1 drop into both eyes at bedtime.       Marland Kitchen warfarin (COUMADIN) 4 MG tablet Take 4-8 mg by mouth daily. Take 2 tablets (8 mg) on Tuesdays and Fridays, then take 1 tablet (4 mg) on Sundays, Mondays,Wednesdays,Thursdays,Saturdays       Assessment: 72yo male on chronic Coumadin for afib.  INR is below target.  Pt is admitted c/o SOB.  Home dose as  noted above.  Anticipate dosing may need to be adjusted to maintain therapeutic INR.  Goal of Therapy:  INR 2-3 Monitor platelets by anticoagulation protocol: Yes   Plan: Repeat Coumadin 7.5mg  today x 1 dose INR daily  Hall, Scott A 05/16/2012,7:52 AM

## 2012-05-16 NOTE — Progress Notes (Signed)
Report was called to Benjaman Lobe, RN and pt taken to room 312 via wheelchair along with personal belongings. Pt tolerated transfer well.

## 2012-05-16 NOTE — Care Management Note (Signed)
    Page 1 of 1   05/20/2012     2:19:15 PM   CARE MANAGEMENT NOTE 05/20/2012  Patient:  Theodore [name], Theodore [name]   Account Number:  [id number]  Date Initiated:  05/16/2012  Documentation initiated by:  Rosemary Holms  Subjective/Objective Assessment:   Pt admitted from home where he lives with his wife. Alicia with [facility] follows both pt and wife. Live in a unheated home and use a kerosene heater to heat one room. Per [facility], the fuses and cold weather have been hard on the pt this winter. Pt     Action/Plan:   does not have home O2 and has been resistent to this in the past. Active with [facility] RN and close to the end of his visits. Will have HH resumed when DC'd.   Anticipated DC Date:  05/18/2012   Anticipated DC Plan:  HOME W HOME HEALTH SERVICES      DC Planning Services  CM consult      Choice offered to / List presented to:          [facility] arranged  HH-1 RN  HH-10 DISEASE MANAGEMENT      HH agency  [facility]   Status of service:  Completed, signed off Medicare Important Message given?   (If response is "NO", the following Medicare IM given date fields will be blank) Date Medicare IM given:   Date Additional Medicare IM given:    Discharge Disposition:  HOME W HOME HEALTH SERVICES  Per UR Regulation:    If discussed at Long Length of Stay Meetings, dates discussed:    Comments:  05/16/12 Rosemary Holms RN BSN CM

## 2012-05-17 ENCOUNTER — Inpatient Hospital Stay (HOSPITAL_COMMUNITY)
Admit: 2012-05-17 | Discharge: 2012-05-17 | Disposition: A | Discharge status: Home / Self Care | Payer: Medicare Other | Attending: Internal Medicine | Admitting: Internal Medicine

## 2012-05-17 DIAGNOSIS — R609 Edema, unspecified: Secondary | ICD-10-CM

## 2012-05-17 LAB — BASIC METABOLIC PANEL
BUN: 25 mg/dL — ABNORMAL HIGH (ref 6–23)
CO2: 29 mEq/L (ref 19–32)
GFR calc non Af Amer: 61 mL/min — ABNORMAL LOW (ref >90)
Glucose, Bld: 142 mg/dL — ABNORMAL HIGH (ref 70–99)
Potassium: 5.8 mEq/L — ABNORMAL HIGH (ref 3.5–5.1)
Sodium: 132 mEq/L — ABNORMAL LOW (ref 135–145)

## 2012-05-17 MED ORDER — WARFARIN SODIUM 2 MG PO TABS
4.0000 mg | ORAL_TABLET | Freq: Once | ORAL | Status: AC
  Administered 2012-05-17: 4 mg via ORAL
  Filled 2012-05-17: qty 2

## 2012-05-17 MED ORDER — LISINOPRIL 5 MG PO TABS
2.5000 mg | ORAL_TABLET | Freq: Every day | ORAL | Status: DC
  Administered 2012-05-18: 2.5 mg via ORAL
  Filled 2012-05-17: qty 1

## 2012-05-17 MED ORDER — SODIUM CHLORIDE 0.9 % IV BOLUS (SEPSIS)
500.0000 mL | Freq: Once | INTRAVENOUS | Status: AC
  Administered 2012-05-17: 500 mL via INTRAVENOUS

## 2012-05-17 NOTE — Progress Notes (Signed)
Subjective: This man is feeling better overall. His left arm is swollen. He had an IV placed him on that side which probably is the cause. He says that he still coughing up off-white sputum. His breathing is a little short .He has not been mobilizing.           Physical Exam: Blood pressure 95/61, pulse 80, temperature 97 F (36.1 C), temperature source Oral, resp. rate 18, height 5\' 6"  (1.676 m), weight 73.4 kg (161 lb 13.1 oz), SpO2 95.00%. He looks systemically well. Heart sounds are present and in atrial fibrillation, ventricular rate is controlled. Oh his left arm is swollen and somewhat firm. There is no significant peripheral pitting edema in his legs. Jugular venous pressure not raised. Lung fields show some inspiratory crackles at the bases. There is no bronchial breathing or wheezing. He does not look toxic or septic.   Investigations:  Recent Results (from the past 240 hour(s))  CULTURE, BLOOD (ROUTINE X 2)     Status: None   Collection Time    05/15/12 12:30 AM      Result Value Range Status   Specimen Description BLOOD RIGHT ANTECUBITAL   Final   Special Requests     Final   Value: BOTTLES DRAWN AEROBIC AND ANAEROBIC AEB=8CC ANA=6CC   Culture NO GROWTH 1 DAY   Final   Report Status PENDING   Incomplete  CULTURE, BLOOD (ROUTINE X 2)     Status: None   Collection Time    05/15/12 12:31 AM      Result Value Range Status   Specimen Description BLOOD LEFT HAND DRAWN BY RN PW   Final   Special Requests BOTTLES DRAWN AEROBIC ONLY 5CC   Final   Culture NO GROWTH 1 DAY   Final   Report Status PENDING   Incomplete  MRSA PCR SCREENING     Status: None   Collection Time    05/15/12  3:49 AM      Result Value Range Status   MRSA by PCR NEGATIVE  NEGATIVE Final   Comment:            The GeneXpert MRSA Assay (FDA     approved for NASAL specimens     only), is one component of a     comprehensive MRSA colonization     surveillance program. It is not     intended  to diagnose MRSA     infection nor to guide or     monitor treatment for     MRSA infections.  CLOSTRIDIUM DIFFICILE BY PCR     Status: None   Collection Time    05/16/12  4:59 PM      Result Value Range Status   C difficile by pcr NEGATIVE  NEGATIVE Final     Basic Metabolic Panel:  Recent Labs  40/98/11 0423 05/16/12 0443 05/17/12 0620  NA 139 136 132*  K 2.8* 4.7 5.8*  CL 96 98 97  CO2 32 30 29  GLUCOSE 113* 93 142*  BUN 29* 22 25*  CREATININE 1.26 0.98 1.16  CALCIUM 8.8 9.2 9.5  MG 1.8  --   --    Liver Function Tests:  Recent Labs  05/15/12 0423  AST 23  ALT 10  ALKPHOS 65  BILITOT 1.1  PROT 7.1  ALBUMIN 2.9*     CBC:  Recent Labs  05/15/12 0020 05/15/12 0423  WBC 12.1* 13.4*  HGB 11.5* 9.9*  HCT  36.0* 31.4*  MCV 90.5 90.8  PLT 213 212    Dg Chest Port 1 View  05/16/2012  *RADIOLOGY REPORT*  Clinical Data: Pneumonia.  Shortness of breath.  PORTABLE CHEST - 1 VIEW  Comparison: 05/15/2012.  Findings: Trachea is midline.  There is mild soft tissue fullness in the superior mediastinum which appears unchanged over multiple prior examinations.  Pulmonary arteries are enlarged as is the heart.  Mild bibasilar air space disease is unchanged, right greater than left. Possible mild right perihilar air space disease. No definite pleural fluid.  IMPRESSION:  1.  No interval change in mild bibasilar air space disease. Difficult to exclude mild right perihilar air space disease. 2.  Pulmonary arterial hypertension.   Original Report Authenticated By: Leanna Battles, M.D.       Medications: I have reviewed the patient's current medications.  Impression: 1. Possible healthcare associated pneumonia. 2. Chronic atrial fibrillation, rate controlled, on Coumadin. 3. Hypertension, currently hypotensive. 4. COPD, stable. 5. History of diastolic congestive heart failure, clinically compensated at the present time. 6. Hyperkalemia.     Plan: 1. Reduce ACE  inhibitor to 2.5 mg daily. 2. Normal saline bolus. 3. Ambulate and try to reduce supplemental oxygen as much as possible. 4. Venous Doppler of the left arm to rule out DVT.     LOS: 2 days   Wilson Singer Pager [phone]  05/17/2012, 10:26 AM

## 2012-05-17 NOTE — Progress Notes (Signed)
UR Chart Review Completed  

## 2012-05-17 NOTE — Progress Notes (Signed)
ANTIBIOTIC CONSULT NOTE  Pharmacy Consult for Vancomycin & Cefepime Indication: pneumonia  No Known Allergies  Patient Measurements: Height: 5\' 6"  (167.6 cm) Weight: 161 lb 13.1 oz (73.4 kg) IBW/kg (Calculated) : 63.8  Vital Signs: Temp: 97 F (36.1 C) (03/07 0510) Temp src: Oral (03/07 0510) BP: 95/61 mmHg (03/07 0510) Pulse Rate: 80 (03/07 0510) Intake/Output from previous day: 03/06 0701 - 03/07 0700 In: 1395 [P.O.:480; I.V.:215; IV Piggyback:700] Out: 100 [Urine:100] Intake/Output from this shift:    Labs:  Recent Labs  05/15/12 0020 05/15/12 0423 05/16/12 0443 05/17/12 0620  WBC 12.1* 13.4*  --   --   HGB 11.5* 9.9*  --   --   PLT 213 212  --   --   CREATININE 1.30 1.26 0.98 1.16   Estimated Creatinine Clearance: 51.9 ml/min (by C-G formula based on Cr of 1.16).  Recent Labs  05/17/12 0621  VANCOTROUGH 18.0     Microbiology: Recent Results (from the past 720 hour(s))  CULTURE, BLOOD (ROUTINE X 2)     Status: None   Collection Time    05/15/12 12:30 AM      Result Value Range Status   Specimen Description BLOOD RIGHT ANTECUBITAL   Final   Special Requests     Final   Value: BOTTLES DRAWN AEROBIC AND ANAEROBIC AEB=8CC ANA=6CC   Culture NO GROWTH 1 DAY   Final   Report Status PENDING   Incomplete  CULTURE, BLOOD (ROUTINE X 2)     Status: None   Collection Time    05/15/12 12:31 AM      Result Value Range Status   Specimen Description BLOOD LEFT HAND DRAWN BY RN PW   Final   Special Requests BOTTLES DRAWN AEROBIC ONLY 5CC   Final   Culture NO GROWTH 1 DAY   Final   Report Status PENDING   Incomplete  MRSA PCR SCREENING     Status: None   Collection Time    05/15/12  3:49 AM      Result Value Range Status   MRSA by PCR NEGATIVE  NEGATIVE Final   Comment:            The GeneXpert MRSA Assay (FDA     approved for NASAL specimens     only), is one component of a     comprehensive MRSA colonization     surveillance program. It is not   intended to diagnose MRSA     infection nor to guide or     monitor treatment for     MRSA infections.  CLOSTRIDIUM DIFFICILE BY PCR     Status: None   Collection Time    05/16/12  4:59 PM      Result Value Range Status   C difficile by pcr NEGATIVE  NEGATIVE Final    Anti-infectives   Start     Dose/Rate Route Frequency Ordered Stop   05/15/12 0800  vancomycin (VANCOCIN) IVPB 1000 mg/200 mL premix     1,000 mg 200 mL/hr over 60 Minutes Intravenous Every 12 hours 05/15/12 0431 05/23/12 0759   05/15/12 0600  ceFEPIme (MAXIPIME) 1 g in dextrose 5 % 50 mL IVPB     1 g 100 mL/hr over 30 Minutes Intravenous 3 times per day 05/15/12 0410 05/23/12 0559   05/15/12 0500  levofloxacin (LEVAQUIN) IVPB 750 mg     750 mg 100 mL/hr over 90 Minutes Intravenous Every 24 hours 05/15/12 0410 05/17/12 0530   05/15/12 0119  dextrose 5 % with cefTRIAXone (ROCEPHIN) ADS Med    Comments:  WATKINS, PENNY: cabinet override      05/15/12 0119 05/15/12 0148   05/15/12 0115  cefTRIAXone (ROCEPHIN) injection 1 g  Status:  Discontinued     1 g Intramuscular  Once 05/15/12 0109 05/15/12 0410   05/15/12 0115  azithromycin (ZITHROMAX) tablet 500 mg     500 mg Oral  Once 05/15/12 0109 05/15/12 0119      Assessment: 72 yo M on day#3 Vancomycin & Cefepime for HCAP.  Renal function is stable.  Vancomycin trough is within goal range today.    Goal of Therapy:  Vancomycin trough level 15-20 mcg/ml  Plan:  Continue Vancomycin 1gm IV Q12h Continue Cefepime 1gm IV Q8h Check weekly trough level while on Vancomycin Monitor renal function and cx data  Duration of therapy per MD- currently ordered x 8 days  Elson Clan 05/17/2012,9:14 AM

## 2012-05-17 NOTE — Progress Notes (Signed)
ANTICOAGULATION CONSULT NOTE   Pharmacy Consult for Coumadin Indication: atrial fibrillation  No Known Allergies  Patient Measurements: Height: 5\' 6"  (167.6 cm) Weight: 161 lb 13.1 oz (73.4 kg) IBW/kg (Calculated) : 63.8  Vital Signs: Temp: 97 F (36.1 C) (03/07 0510) Temp src: Oral (03/07 0510) BP: 95/61 mmHg (03/07 0510) Pulse Rate: 80 (03/07 0510)  Labs:  Recent Labs  05/15/12 0020 05/15/12 0423 05/16/12 0443 05/17/12 0620  HGB 11.5* 9.9*  --   --   HCT 36.0* 31.4*  --   --   PLT 213 212  --   --   LABPROT 18.8*  --  19.0* 26.4*  INR 1.63*  --  1.65* 2.58*  CREATININE 1.30 1.26 0.98 1.16  TROPONINI <0.30  --   --   --    Estimated Creatinine Clearance: 51.9 ml/min (by C-G formula based on Cr of 1.16).  Medical History: Past Medical History  Diagnosis Date  . Anemia     Hemoglobin 11.6 in 04/2008->resolved   . Edema     Lower Extremity  . Tobacco abuse   . Glaucoma(365)   . Allergic rhinitis   . Atrial fibrillation     Coumadin;asymptomatic bradycardia on telemetry in 12/2009  . CHF (congestive heart failure)     H/o CHF with preserved LV EF; pulmonary hypertension; normal EF in 03/2009  . Hypertension   . Shingles   . Shortness of breath   . COPD (chronic obstructive pulmonary disease)   . Asthma    Medications:  Prescriptions prior to admission  Medication Sig Dispense Refill  . albuterol (PROVENTIL HFA;VENTOLIN HFA) 108 (90 BASE) MCG/ACT inhaler Inhale 2 puffs into the lungs every 4 (four) hours as needed for wheezing or shortness of breath.      Marland Kitchen albuterol (PROVENTIL) (2.5 MG/3ML) 0.083% nebulizer solution Take 3 mLs (2.5 mg total) by nebulization 3 (three) times daily.  75 mL  1  . azithromycin (ZITHROMAX Z-PAK) 250 MG tablet Take 250 mg by mouth daily. Started on 05/13/12 Patient took 2 tablets then takes 1 tablet daily for 4 days      . brimonidine-timolol (COMBIGAN) 0.2-0.5 % ophthalmic solution Place 1 drop into both eyes every 12 (twelve) hours.         . budesonide-formoterol (SYMBICORT) 160-4.5 MCG/ACT inhaler Inhale 2 puffs into the lungs 2 (two) times daily.      . furosemide (LASIX) 40 MG tablet Take 1 tablet (40 mg total) by mouth daily.  60 tablet  3  . lisinopril (PRINIVIL,ZESTRIL) 5 MG tablet Take 1 tablet (5 mg total) by mouth daily.  30 tablet  1  . montelukast (SINGULAIR) 10 MG tablet Take 10 mg by mouth at bedtime.      . potassium chloride (K-DUR) 10 MEQ tablet Take 10 mEq by mouth daily.      . temazepam (RESTORIL) 15 MG capsule Take 1 capsule (15 mg total) by mouth at bedtime as needed for sleep.  30 capsule  3  . tiotropium (SPIRIVA) 18 MCG inhalation capsule Place 18 mcg into inhaler and inhale daily.      . travoprost, benzalkonium, (TRAVATAN Z) 0.004 % ophthalmic solution Place 1 drop into both eyes at bedtime.       Marland Kitchen warfarin (COUMADIN) 4 MG tablet Take 4-8 mg by mouth daily. Take 2 tablets (8 mg) on Tuesdays and Fridays, then take 1 tablet (4 mg) on Sundays, Mondays,Wednesdays,Thursdays,Saturdays       Assessment: 72yo male on chronic Coumadin  for afib.  INR was sub-therapeutic on admission but is within goal range today. No bleeding noted.    Goal of Therapy:  INR 2-3   Plan: Coumadin 4mg  today per home dose INR daily  Elson Clan 05/17/2012,9:12 AM

## 2012-05-18 ENCOUNTER — Inpatient Hospital Stay (HOSPITAL_COMMUNITY): Payer: Medicare Other

## 2012-05-18 DIAGNOSIS — I10 Essential (primary) hypertension: Secondary | ICD-10-CM

## 2012-05-18 LAB — CBC
HCT: 31.7 % — ABNORMAL LOW (ref 39.0–52.0)
Hemoglobin: 10.2 g/dL — ABNORMAL LOW (ref 13.0–17.0)
MCHC: 32.2 g/dL (ref 30.0–36.0)
RBC: 3.5 MIL/uL — ABNORMAL LOW (ref 4.22–5.81)
WBC: 10.7 10*3/uL — ABNORMAL HIGH (ref 4.0–10.5)

## 2012-05-18 LAB — COMPREHENSIVE METABOLIC PANEL
ALT: 13 U/L (ref 0–53)
Alkaline Phosphatase: 75 U/L (ref 39–117)
BUN: 22 mg/dL (ref 6–23)
CO2: 28 mEq/L (ref 19–32)
Chloride: 99 mEq/L (ref 96–112)
GFR calc Af Amer: 84 mL/min — ABNORMAL LOW (ref >90)
Glucose, Bld: 93 mg/dL (ref 70–99)
Potassium: 5.4 mEq/L — ABNORMAL HIGH (ref 3.5–5.1)
Sodium: 134 mEq/L — ABNORMAL LOW (ref 135–145)
Total Bilirubin: 0.4 mg/dL (ref 0.3–1.2)
Total Protein: 7.3 g/dL (ref 6.0–8.3)

## 2012-05-18 LAB — PROTIME-INR
INR: 3.14 — ABNORMAL HIGH (ref 0.00–1.49)
Prothrombin Time: 30.6 seconds — ABNORMAL HIGH (ref 11.6–15.2)

## 2012-05-18 MED ORDER — DILTIAZEM HCL ER COATED BEADS 120 MG PO CP24
120.0000 mg | ORAL_CAPSULE | Freq: Every day | ORAL | Status: DC
  Administered 2012-05-18 – 2012-05-20 (×3): 120 mg via ORAL
  Filled 2012-05-18 (×3): qty 1

## 2012-05-18 MED ORDER — WARFARIN SODIUM 1 MG PO TABS
1.0000 mg | ORAL_TABLET | Freq: Once | ORAL | Status: AC
  Administered 2012-05-18: 1 mg via ORAL
  Filled 2012-05-18: qty 1

## 2012-05-18 MED ORDER — FUROSEMIDE 40 MG PO TABS
40.0000 mg | ORAL_TABLET | Freq: Once | ORAL | Status: AC
  Administered 2012-05-18: 40 mg via ORAL
  Filled 2012-05-18: qty 1

## 2012-05-18 NOTE — Progress Notes (Signed)
Subjective: This man feels he is still dyspneic. He is only on 1 L of oxygen per minute saturating 99%. Also he is noted on telemetry to have tachycardia whenever he tries to stand up and walk, in atrial fibrillation. He has had no fever. He has also noticed swelling of the right leg. The left arm swelling was not related to DVT.          Physical Exam: Blood pressure 116/68, pulse 110, temperature 97.7 F (36.5 C), temperature source Oral, resp. rate 19, height 5\' 6"  (1.676 m), weight 71.7 kg (158 lb 1.1 oz), SpO2 99.00%. He looks systemically well. Heart sounds are present and in atrial fibrillation, ventricular rate is somewhat increased. Oh his left arm is swollen and somewhat firm. There is no significant peripheral pitting edema in his legs. Jugular venous pressure not raised. Lung fields show some inspiratory crackles at the bases. There is no bronchial breathing or wheezing. He does not look toxic or septic.   Investigations:  Recent Results (from the past 240 hour(s))  CULTURE, BLOOD (ROUTINE X 2)     Status: None   Collection Time    05/15/12 12:30 AM      Result Value Range Status   Specimen Description BLOOD RIGHT ANTECUBITAL   Final   Special Requests     Final   Value: BOTTLES DRAWN AEROBIC AND ANAEROBIC AEB=8CC ANA=6CC   Culture NO GROWTH 1 DAY   Final   Report Status PENDING   Incomplete  CULTURE, BLOOD (ROUTINE X 2)     Status: None   Collection Time    05/15/12 12:31 AM      Result Value Range Status   Specimen Description BLOOD LEFT HAND DRAWN BY RN PW   Final   Special Requests BOTTLES DRAWN AEROBIC ONLY 5CC   Final   Culture NO GROWTH 1 DAY   Final   Report Status PENDING   Incomplete  MRSA PCR SCREENING     Status: None   Collection Time    05/15/12  3:49 AM      Result Value Range Status   MRSA by PCR NEGATIVE  NEGATIVE Final   Comment:            The GeneXpert MRSA Assay (FDA     approved for NASAL specimens     only), is one component of  a     comprehensive MRSA colonization     surveillance program. It is not     intended to diagnose MRSA     infection nor to guide or     monitor treatment for     MRSA infections.  CLOSTRIDIUM DIFFICILE BY PCR     Status: None   Collection Time    05/16/12  4:59 PM      Result Value Range Status   C difficile by pcr NEGATIVE  NEGATIVE Final     Basic Metabolic Panel:  Recent Labs  16/10/96 0620 05/18/12 0605  NA 132* 134*  K 5.8* 5.4*  CL 97 99  CO2 29 28  GLUCOSE 142* 93  BUN 25* 22  CREATININE 1.16 1.01  CALCIUM 9.5 9.6   Liver Function Tests:  Recent Labs  05/18/12 0605  AST 16  ALT 13  ALKPHOS 75  BILITOT 0.4  PROT 7.3  ALBUMIN 2.8*     CBC:  Recent Labs  05/18/12 0605  WBC 10.7*  HGB 10.2*  HCT 31.7*  MCV 90.6  PLT  247    Dg Chest 1 View  05/18/2012  *RADIOLOGY REPORT*  Clinical Data: Pneumonia.  Shortness of breath.  CHEST - 1 VIEW  Comparison: Chest 05/16/2012.  Findings: There is cardiomegaly.  The lungs are emphysematous. Right basilar airspace disease persists.  Fullness of the right hilum appears unchanged.  No pneumothorax identified.  IMPRESSION:  1.  No marked change in the right basilar airspace disease worrisome for pneumonia. 2.  Emphysema. 3.  Cardiomegaly.   Original Report Authenticated By: Holley Dexter, M.D.    US Venous Img Upper Uni Left  05/17/2012  *RADIOLOGY REPORT*  Clinical Data: Left arm swelling question deep venous thrombosis  LEFT UPPER EXTREMITY VENOUS DUPLEX ULTRASOUND  Technique:  Gray-scale sonography with graded compression, as well as color Doppler and duplex ultrasound were performed to evaluate the upper extremity deep venous system from the level of the subclavian vein and including the jugular, axillary, basilic and upper cephalic vein.  Spectral Doppler was utilized to evaluate flow at rest and with distal augmentation maneuvers.  Comparison:  None  Findings: Deep venous system of the left upper extremity is  patent and compressible. Spontaneous venous flow is present with respiratory phasicity and augmentation. No intraluminal thrombus identified. Visualized portions of the left subclavian and left jugular veins appear patent. Subcutaneous edema identified at the left forearm. Visualized superficial veins appear patent.  IMPRESSION: No evidence of deep venous thrombosis in left upper extremity.   Original Report Authenticated By: Ulyses Southward, M.D.       Medications: I have reviewed the patient's current medications.  Impression: 1. Healthcare associated pneumonia. 2. Chronic atrial fibrillation, rate increased, on Coumadin. 3. Hypertension, currently hypotensive. 4. COPD, stable. 5. History of diastolic congestive heart failure, clinically compensated at the present time. 6. Hyperkalemia.     Plan: 1. Discontinue ACE inhibitor. 2. Add Cardizem CD 120 mg daily for ventricular rate control of atrial fibrillation. 3. Discontinue oxygen and evaluate saturations on room air. 4. Venous Doppler  the right leg to rule out DVT.     LOS: 3 days   Wilson Singer Pager [phone]  05/18/2012, 11:39 AM

## 2012-05-18 NOTE — Progress Notes (Signed)
Pt ambulated in halls without O2 pt became SOB and returned to room O2 sat 76%  @ Liters [place] applied Dr Karilyn Cota notified  O2 sat 100% on 2 liters   O@ down to 1 liter

## 2012-05-18 NOTE — Progress Notes (Signed)
ANTICOAGULATION CONSULT NOTE   Pharmacy Consult for Coumadin Indication: atrial fibrillation  No Known Allergies  Patient Measurements: Height: 5\' 6"  (167.6 cm) Weight: 158 lb 1.1 oz (71.7 kg) IBW/kg (Calculated) : 63.8  Vital Signs: Temp: 97.7 F (36.5 C) (03/08 0543) Temp src: Oral (03/08 0543) BP: 116/68 mmHg (03/08 0543) Pulse Rate: 110 (03/08 0543)  Labs:  Recent Labs  05/16/12 0443 05/17/12 0620 05/18/12 0605  HGB  --   --  10.2*  HCT  --   --  31.7*  PLT  --   --  247  LABPROT 19.0* 26.4* 30.6*  INR 1.65* 2.58* 3.14*  CREATININE 0.98 1.16 1.01   Estimated Creatinine Clearance: 59.7 ml/min (by C-G formula based on Cr of 1.01).  Medical History: Past Medical History  Diagnosis Date  . Anemia     Hemoglobin 11.6 in 04/2008->resolved   . Edema     Lower Extremity  . Tobacco abuse   . Glaucoma(365)   . Allergic rhinitis   . Atrial fibrillation     Coumadin;asymptomatic bradycardia on telemetry in 12/2009  . CHF (congestive heart failure)     H/o CHF with preserved LV EF; pulmonary hypertension; normal EF in 03/2009  . Hypertension   . Shingles   . Shortness of breath   . COPD (chronic obstructive pulmonary disease)   . Asthma    Medications:  Prescriptions prior to admission  Medication Sig Dispense Refill  . albuterol (PROVENTIL HFA;VENTOLIN HFA) 108 (90 BASE) MCG/ACT inhaler Inhale 2 puffs into the lungs every 4 (four) hours as needed for wheezing or shortness of breath.      Marland Kitchen albuterol (PROVENTIL) (2.5 MG/3ML) 0.083% nebulizer solution Take 3 mLs (2.5 mg total) by nebulization 3 (three) times daily.  75 mL  1  . azithromycin (ZITHROMAX Z-PAK) 250 MG tablet Take 250 mg by mouth daily. Started on 05/13/12 Patient took 2 tablets then takes 1 tablet daily for 4 days      . brimonidine-timolol (COMBIGAN) 0.2-0.5 % ophthalmic solution Place 1 drop into both eyes every 12 (twelve) hours.        . budesonide-formoterol (SYMBICORT) 160-4.5 MCG/ACT inhaler Inhale  2 puffs into the lungs 2 (two) times daily.      . furosemide (LASIX) 40 MG tablet Take 1 tablet (40 mg total) by mouth daily.  60 tablet  3  . lisinopril (PRINIVIL,ZESTRIL) 5 MG tablet Take 1 tablet (5 mg total) by mouth daily.  30 tablet  1  . montelukast (SINGULAIR) 10 MG tablet Take 10 mg by mouth at bedtime.      . potassium chloride (K-DUR) 10 MEQ tablet Take 10 mEq by mouth daily.      . temazepam (RESTORIL) 15 MG capsule Take 1 capsule (15 mg total) by mouth at bedtime as needed for sleep.  30 capsule  3  . tiotropium (SPIRIVA) 18 MCG inhalation capsule Place 18 mcg into inhaler and inhale daily.      . travoprost, benzalkonium, (TRAVATAN Z) 0.004 % ophthalmic solution Place 1 drop into both eyes at bedtime.       Marland Kitchen warfarin (COUMADIN) 4 MG tablet Take 4-8 mg by mouth daily. Take 2 tablets (8 mg) on Tuesdays and Fridays, then take 1 tablet (4 mg) on Sundays, Mondays,Wednesdays,Thursdays,Saturdays       Assessment: 71yo male on chronic Coumadin for afib.  INR was sub-therapeutic on admission but is now slightly above goal range today.  This is likely secondary to 7.5mg  loading  dose x2 given this admission.  No bleeding noted.    Goal of Therapy:  INR 2-3   Plan: Coumadin 1mg  today to maintain therapeutic range INR daily  Rosan Calbert, [facility] 05/18/2012,8:50 AM

## 2012-05-19 DIAGNOSIS — Z7901 Long term (current) use of anticoagulants: Secondary | ICD-10-CM

## 2012-05-19 LAB — BASIC METABOLIC PANEL
BUN: 18 mg/dL (ref 6–23)
Creatinine, Ser: 0.92 mg/dL (ref 0.50–1.35)
GFR calc Af Amer: >90 mL/min (ref >90)
GFR calc non Af Amer: 82 mL/min — ABNORMAL LOW (ref >90)
Glucose, Bld: 94 mg/dL (ref 70–99)
Potassium: 4.7 mEq/L (ref 3.5–5.1)

## 2012-05-19 LAB — PROTIME-INR: Prothrombin Time: 28.1 seconds — ABNORMAL HIGH (ref 11.6–15.2)

## 2012-05-19 MED ORDER — LEVOFLOXACIN 750 MG PO TABS
750.0000 mg | ORAL_TABLET | Freq: Every day | ORAL | Status: DC
  Administered 2012-05-19 – 2012-05-20 (×2): 750 mg via ORAL
  Filled 2012-05-19 (×2): qty 1

## 2012-05-19 MED ORDER — WARFARIN SODIUM 2 MG PO TABS
4.0000 mg | ORAL_TABLET | Freq: Once | ORAL | Status: AC
  Administered 2012-05-19: 4 mg via ORAL
  Filled 2012-05-19: qty 2

## 2012-05-19 MED ORDER — OXYCODONE HCL 5 MG PO TABS
5.0000 mg | ORAL_TABLET | Freq: Four times a day (QID) | ORAL | Status: DC | PRN
  Administered 2012-05-19 – 2012-05-20 (×3): 5 mg via ORAL
  Filled 2012-05-19 (×3): qty 1

## 2012-05-19 NOTE — Progress Notes (Signed)
Pt ambulated in halls. Tolerated well. 

## 2012-05-19 NOTE — Progress Notes (Signed)
ANTICOAGULATION CONSULT NOTE   Pharmacy Consult for Coumadin Indication: atrial fibrillation  No Known Allergies  Patient Measurements: Height: 5\' 6"  (167.6 cm) Weight: 158 lb 11.7 oz (72 kg) IBW/kg (Calculated) : 63.8  Vital Signs: Temp: 99 F (37.2 C) (03/09 0520) Temp src: Oral (03/09 0520) BP: 117/75 mmHg (03/09 0520) Pulse Rate: 87 (03/09 0520)  Labs:  Recent Labs  05/17/12 0620 05/18/12 0605 05/19/12 0608  HGB  --  10.2*  --   HCT  --  31.7*  --   PLT  --  247  --   LABPROT 26.4* 30.6* 28.1*  INR 2.58* 3.14* 2.80*  CREATININE 1.16 1.01 0.92   Estimated Creatinine Clearance: 65.5 ml/min (by C-G formula based on Cr of 0.92).  Medical History: Past Medical History  Diagnosis Date  . Anemia     Hemoglobin 11.6 in 04/2008->resolved   . Edema     Lower Extremity  . Tobacco abuse   . Glaucoma(365)   . Allergic rhinitis   . Atrial fibrillation     Coumadin;asymptomatic bradycardia on telemetry in 12/2009  . CHF (congestive heart failure)     H/o CHF with preserved LV EF; pulmonary hypertension; normal EF in 03/2009  . Hypertension   . Shingles   . Shortness of breath   . COPD (chronic obstructive pulmonary disease)   . Asthma    Medications:  Prescriptions prior to admission  Medication Sig Dispense Refill  . albuterol (PROVENTIL HFA;VENTOLIN HFA) 108 (90 BASE) MCG/ACT inhaler Inhale 2 puffs into the lungs every 4 (four) hours as needed for wheezing or shortness of breath.      Marland Kitchen albuterol (PROVENTIL) (2.5 MG/3ML) 0.083% nebulizer solution Take 3 mLs (2.5 mg total) by nebulization 3 (three) times daily.  75 mL  1  . azithromycin (ZITHROMAX Z-PAK) 250 MG tablet Take 250 mg by mouth daily. Started on 05/13/12 Patient took 2 tablets then takes 1 tablet daily for 4 days      . brimonidine-timolol (COMBIGAN) 0.2-0.5 % ophthalmic solution Place 1 drop into both eyes every 12 (twelve) hours.        . budesonide-formoterol (SYMBICORT) 160-4.5 MCG/ACT inhaler Inhale 2  puffs into the lungs 2 (two) times daily.      . furosemide (LASIX) 40 MG tablet Take 1 tablet (40 mg total) by mouth daily.  60 tablet  3  . lisinopril (PRINIVIL,ZESTRIL) 5 MG tablet Take 1 tablet (5 mg total) by mouth daily.  30 tablet  1  . montelukast (SINGULAIR) 10 MG tablet Take 10 mg by mouth at bedtime.      . potassium chloride (K-DUR) 10 MEQ tablet Take 10 mEq by mouth daily.      . temazepam (RESTORIL) 15 MG capsule Take 1 capsule (15 mg total) by mouth at bedtime as needed for sleep.  30 capsule  3  . tiotropium (SPIRIVA) 18 MCG inhalation capsule Place 18 mcg into inhaler and inhale daily.      . travoprost, benzalkonium, (TRAVATAN Z) 0.004 % ophthalmic solution Place 1 drop into both eyes at bedtime.       Marland Kitchen warfarin (COUMADIN) 4 MG tablet Take 4-8 mg by mouth daily. Take 2 tablets (8 mg) on Tuesdays and Fridays, then take 1 tablet (4 mg) on Sundays, Mondays,Wednesdays,Thursdays,Saturdays       Assessment: 72yo male on chronic Coumadin for afib.  INR was sub-therapeutic on admission but is now within goal range.  No bleeding noted.    Goal of Therapy:  INR 2-3   Plan: Coumadin 4mg  po x1 today  INR daily  Elson Clan 05/19/2012,11:00 AM

## 2012-05-19 NOTE — Progress Notes (Signed)
Subjective: This man feels he is still dyspneic. He is only on 1 L of oxygen per minute saturating 99%. However, on room air, he desaturates very quickly. He is now keen on having home oxygen. His ventricular rate is now better controlled on Cardizem CD.          Physical Exam: Blood pressure 117/75, pulse 87, temperature 99 F (37.2 C), temperature source Oral, resp. rate 18, height 5\' 6"  (1.676 m), weight 72 kg (158 lb 11.7 oz), SpO2 93.00%. He looks systemically well. Heart sounds are present and in atrial fibrillation, ventricular rate is controlled.  There is no significant peripheral pitting edema in his legs. Jugular venous pressure not raised. Lung fields show some inspiratory crackles at the bases. There is no bronchial breathing or wheezing. He does not look toxic or septic.   Investigations:  Recent Results (from the past 240 hour(s))  CULTURE, BLOOD (ROUTINE X 2)     Status: None   Collection Time    05/15/12 12:30 AM      Result Value Range Status   Specimen Description BLOOD RIGHT ANTECUBITAL   Final   Special Requests     Final   Value: BOTTLES DRAWN AEROBIC AND ANAEROBIC AEB=8CC ANA=6CC   Culture NO GROWTH 1 DAY   Final   Report Status PENDING   Incomplete  CULTURE, BLOOD (ROUTINE X 2)     Status: None   Collection Time    05/15/12 12:31 AM      Result Value Range Status   Specimen Description BLOOD LEFT HAND DRAWN BY RN PW   Final   Special Requests BOTTLES DRAWN AEROBIC ONLY 5CC   Final   Culture NO GROWTH 1 DAY   Final   Report Status PENDING   Incomplete  MRSA PCR SCREENING     Status: None   Collection Time    05/15/12  3:49 AM      Result Value Range Status   MRSA by PCR NEGATIVE  NEGATIVE Final   Comment:            The GeneXpert MRSA Assay (FDA     approved for NASAL specimens     only), is one component of a     comprehensive MRSA colonization     surveillance program. It is not     intended to diagnose MRSA     infection nor to guide  or     monitor treatment for     MRSA infections.  CLOSTRIDIUM DIFFICILE BY PCR     Status: None   Collection Time    05/16/12  4:59 PM      Result Value Range Status   C difficile by pcr NEGATIVE  NEGATIVE Final     Basic Metabolic Panel:  Recent Labs  82/95/62 0605 05/19/12 0608  NA 134* 134*  K 5.4* 4.7  CL 99 99  CO2 28 28  GLUCOSE 93 94  BUN 22 18  CREATININE 1.01 0.92  CALCIUM 9.6 9.3   Liver Function Tests:  Recent Labs  05/18/12 0605  AST 16  ALT 13  ALKPHOS 75  BILITOT 0.4  PROT 7.3  ALBUMIN 2.8*     CBC:  Recent Labs  05/18/12 0605  WBC 10.7*  HGB 10.2*  HCT 31.7*  MCV 90.6  PLT 247    Dg Chest 1 View  05/18/2012  *RADIOLOGY REPORT*  Clinical Data: Pneumonia.  Shortness of breath.  CHEST - 1 VIEW  Comparison: Chest 05/16/2012.  Findings: There is cardiomegaly.  The lungs are emphysematous. Right basilar airspace disease persists.  Fullness of the right hilum appears unchanged.  No pneumothorax identified.  IMPRESSION:  1.  No marked change in the right basilar airspace disease worrisome for pneumonia. 2.  Emphysema. 3.  Cardiomegaly.   Original Report Authenticated By: Holley Dexter, M.D.    US Venous Img Lower Unilateral Right  05/18/2012  *RADIOLOGY REPORT*  Clinical Data: Right leg pain  RIGHT LOWER EXTREMITY VENOUS DUPLEX ULTRASOUND  Technique:  Gray-scale sonography with graded compression, as well as color Doppler and duplex ultrasound were performed to evaluate the deep venous system of the lower extremity from the level of the common femoral vein through the popliteal and proximal calf veins. Spectral Doppler was utilized to evaluate flow at rest and with distal augmentation maneuvers.  Comparison:  None.  Findings:  Normal compressibility of the common femoral, superficial femoral, and popliteal veins is demonstrated, as well as the visualized proximal calf veins.  No filling defects to suggest DVT on grayscale or color Doppler imaging.   Doppler waveforms show normal direction of venous flow, normal respiratory phasicity and response to augmentation.  IMPRESSION: No evidence of lower extremity deep vein thrombosis.   Original Report Authenticated By: Janeece Riggers, M.D.    US Venous Img Upper Uni Left  05/17/2012  *RADIOLOGY REPORT*  Clinical Data: Left arm swelling question deep venous thrombosis  LEFT UPPER EXTREMITY VENOUS DUPLEX ULTRASOUND  Technique:  Gray-scale sonography with graded compression, as well as color Doppler and duplex ultrasound were performed to evaluate the upper extremity deep venous system from the level of the subclavian vein and including the jugular, axillary, basilic and upper cephalic vein.  Spectral Doppler was utilized to evaluate flow at rest and with distal augmentation maneuvers.  Comparison:  None  Findings: Deep venous system of the left upper extremity is patent and compressible. Spontaneous venous flow is present with respiratory phasicity and augmentation. No intraluminal thrombus identified. Visualized portions of the left subclavian and left jugular veins appear patent. Subcutaneous edema identified at the left forearm. Visualized superficial veins appear patent.  IMPRESSION: No evidence of deep venous thrombosis in left upper extremity.   Original Report Authenticated By: Ulyses Southward, M.D.       Medications: I have reviewed the patient's current medications.  Impression: 1. Healthcare associated pneumonia. 2. Chronic atrial fibrillation, rate increased, on Coumadin. 3. Hypertension. Now stable. 4. COPD, stable. 5. History of diastolic congestive heart failure, clinically compensated at the present time. 6. Hyperkalemia, resolved since discontinuing ACE inhibitor.     Plan: 1. Discontinue IV antibiotics and switched to oral Levaquin. 2. If he is medically stable tomorrow, we can discharge him home with oxygen, if he agrees to it and another week course of antibiotics.     LOS: 4 days    Wilson Singer Pager [phone]  05/19/2012, 10:36 AM

## 2012-05-20 LAB — CULTURE, BLOOD (ROUTINE X 2): Culture: NO GROWTH

## 2012-05-20 LAB — BASIC METABOLIC PANEL
BUN: 14 mg/dL (ref 6–23)
CO2: 30 mEq/L (ref 19–32)
Calcium: 9.5 mg/dL (ref 8.4–10.5)
Glucose, Bld: 93 mg/dL (ref 70–99)
Potassium: 5.1 mEq/L (ref 3.5–5.1)
Sodium: 136 mEq/L (ref 135–145)

## 2012-05-20 MED ORDER — WARFARIN SODIUM 2 MG PO TABS: 4.0000 mg | ORAL_TABLET | Freq: Once | ORAL | Status: DC

## 2012-05-20 MED ORDER — BUDESONIDE-FORMOTEROL FUMARATE 160-4.5 MCG/ACT IN AERO: 2.0000 | INHALATION_SPRAY | Freq: Two times a day (BID) | RESPIRATORY_TRACT | Status: DC

## 2012-05-20 MED ORDER — DILTIAZEM HCL ER COATED BEADS 120 MG PO CP24: 120.0000 mg | ORAL_CAPSULE | Freq: Every day | ORAL | Status: DC

## 2012-05-20 MED ORDER — OXYCODONE HCL 5 MG PO TABS: 5.0000 mg | ORAL_TABLET | Freq: Four times a day (QID) | ORAL | Status: DC | PRN

## 2012-05-20 MED ORDER — LEVOFLOXACIN 750 MG PO TABS: 750.0000 mg | ORAL_TABLET | Freq: Every day | ORAL | Status: DC

## 2012-05-20 NOTE — Discharge Summary (Addendum)
Physician Discharge Summary  Theodore [name] KGM:[id number] DOB: April 11, 1940 DOA: 05/15/2012  PCP: Syliva Overman, MD  Admit date: 05/15/2012 Discharge date: 05/20/2012  Time spent: Greater than 30 minutes  Recommendations for Outpatient Follow-up:  1. Followup with primary care physician.   Discharge Diagnoses:  1. Right-sided healthcare associated pneumonia with acute on chronic respiratory failure. 2. Chronic atrial fibrillation on Coumadin. 3. Hypertension. 4. COPD, stable. He qualifies for home oxygen, unfortunately he does not wish to have any. 5. History of diastolic congestive heart failure, clinically compensated. 6. Hypokalemia, resolved after discontinuing ACE inhibitor.   Discharge Condition: Stable.  Diet recommendation: Heart healthy.  Filed Weights   05/18/12 0543 05/19/12 0520 05/20/12 0538  Weight: 71.7 kg (158 lb 1.1 oz) 72 kg (158 lb 11.7 oz) 71.8 kg (158 lb 4.6 oz)    History of present illness:  This 72 year old man presents to the hospital with symptoms dyspnea for 2 days. Please see initial history as outlined below: HPI: Theodore [name] is a 72 y.o. male with a past medical history of atrial fibrillation, diastolic CHF, COPD, hypertension, noncompliance, who was recently hospitalized in January for CHF. Patient tells me that he has been short of breath for 2 days. He's had chills. Denies any fever. Has had a cough with clear expectoration. No chest pain. Has had a few episodes of emesis. Has been dizzy, but denies any syncopal episode. He tried taking inhalers and nebulizer treatments with no relief. Denies any palpitations. He says his wife and his son have had cold like symptoms in the last week or so. He admits to getting a flu shot this season. He was seen by Dr. Juanetta Gosling yesterday and was prescribed Z-Pak and he took 2 tablets. Since symptoms were not getting better and actually he was feeling worse he decided to come in to the hospital. Denies using home  oxygen.  Hospital Course:  Patient was admitted and started on antibiotics, initially intravenously and then oral. It was noticed that his atrial fibrillation at a rapid ventricular rate. He did not feel uncomfortable with this but Cardizem was initiated again and this has improved his ventricular rate. He did desaturate without oxygen on minimal exertion but he did not wish to have home oxygen. He is now clinically stable for discharge.  Procedures:  None.    Consultations:  None.  Discharge Exam: Filed Vitals:   05/19/12 0838 05/19/12 1440 05/19/12 2218 05/20/12 0538  BP:  122/55 117/69 115/71  Pulse:  92 102 81  Temp:  97.9 F (36.6 C) 101.9 F (38.8 C) 100.8 F (38.2 C)  TempSrc:  Oral Oral Oral  Resp:  18 19 18   Height:      Weight:    71.8 kg (158 lb 4.6 oz)  SpO2: 93% 95%      General: He looks systemically well. Cardiovascular: Heart sounds are present and normal. Respiratory: Lung fields show scattered crackles in the right mid and lower zones and also in the left lower zone.  Discharge Instructions  Discharge Orders   Future Appointments Provider Department Dept Phone   06/10/2012 1:00 PM Jodelle Gross, NP [facility] [place] [phone]   06/10/2012 1:40 PM [facility] [place] [phone]   07/09/2012 2:30 PM Kerri Perches, MD [facility] [phone]   Future Orders Complete By Expires     Diet - low sodium heart healthy  As directed     Increase activity slowly  As directed  Medication List    STOP taking these medications       lisinopril 5 MG tablet  Commonly known as:  PRINIVIL,ZESTRIL     ZITHROMAX Z-PAK 250 MG tablet  Generic drug:  azithromycin      TAKE these medications       albuterol (2.5 MG/3ML) 0.083% nebulizer solution  Commonly known as:  PROVENTIL  Take 3 mLs (2.5 mg total) by nebulization 3 (three) times daily.     albuterol 108 (90 BASE) MCG/ACT  inhaler  Commonly known as:  PROVENTIL HFA;VENTOLIN HFA  Inhale 2 puffs into the lungs every 4 (four) hours as needed for wheezing or shortness of breath.     budesonide-formoterol 160-4.5 MCG/ACT inhaler  Commonly known as:  SYMBICORT  Inhale 2 puffs into the lungs 2 (two) times daily.     budesonide-formoterol 160-4.5 MCG/ACT inhaler  Commonly known as:  SYMBICORT  Inhale 2 puffs into the lungs 2 (two) times daily.     COMBIGAN 0.2-0.5 % ophthalmic solution  Generic drug:  brimonidine-timolol  Place 1 drop into both eyes every 12 (twelve) hours.     diltiazem 120 MG 24 hr capsule  Commonly known as:  CARDIZEM CD  Take 1 capsule (120 mg total) by mouth daily.     furosemide 40 MG tablet  Commonly known as:  LASIX  Take 1 tablet (40 mg total) by mouth daily.     levofloxacin 750 MG tablet  Commonly known as:  LEVAQUIN  Take 1 tablet (750 mg total) by mouth daily.     montelukast 10 MG tablet  Commonly known as:  SINGULAIR  Take 10 mg by mouth at bedtime.     oxyCODONE 5 MG immediate release tablet  Commonly known as:  Oxy IR/ROXICODONE  Take 1 tablet (5 mg total) by mouth every 6 (six) hours as needed.     potassium chloride 10 MEQ tablet  Commonly known as:  K-DUR  Take 10 mEq by mouth daily.     temazepam 15 MG capsule  Commonly known as:  RESTORIL  Take 1 capsule (15 mg total) by mouth at bedtime as needed for sleep.     tiotropium 18 MCG inhalation capsule  Commonly known as:  SPIRIVA  Place 18 mcg into inhaler and inhale daily.     travoprost (benzalkonium) 0.004 % ophthalmic solution  Commonly known as:  TRAVATAN  Place 1 drop into both eyes at bedtime.     warfarin 4 MG tablet  Commonly known as:  COUMADIN  Take 4-8 mg by mouth daily. Take 2 tablets (8 mg) on Tuesdays and Fridays, then take 1 tablet (4 mg) on Sundays, Mondays,Wednesdays,Thursdays,Saturdays          The results of significant diagnostics from this hospitalization (including imaging,  microbiology, ancillary and laboratory) are listed below for reference.    Significant Diagnostic Studies: Dg Chest 1 View  05/18/2012  *RADIOLOGY REPORT*  Clinical Data: Pneumonia.  Shortness of breath.  CHEST - 1 VIEW  Comparison: Chest 05/16/2012.  Findings: There is cardiomegaly.  The lungs are emphysematous. Right basilar airspace disease persists.  Fullness of the right hilum appears unchanged.  No pneumothorax identified.  IMPRESSION:  1.  No marked change in the right basilar airspace disease worrisome for pneumonia. 2.  Emphysema. 3.  Cardiomegaly.   Original Report Authenticated By: Holley Dexter, M.D.    US Venous Img Lower Unilateral Right  05/18/2012  *RADIOLOGY REPORT*  Clinical Data: Right leg pain  RIGHT LOWER EXTREMITY VENOUS DUPLEX ULTRASOUND  Technique:  Gray-scale sonography with graded compression, as well as color Doppler and duplex ultrasound were performed to evaluate the deep venous system of the lower extremity from the level of the common femoral vein through the popliteal and proximal calf veins. Spectral Doppler was utilized to evaluate flow at rest and with distal augmentation maneuvers.  Comparison:  None.  Findings:  Normal compressibility of the common femoral, superficial femoral, and popliteal veins is demonstrated, as well as the visualized proximal calf veins.  No filling defects to suggest DVT on grayscale or color Doppler imaging.  Doppler waveforms show normal direction of venous flow, normal respiratory phasicity and response to augmentation.  IMPRESSION: No evidence of lower extremity deep vein thrombosis.   Original Report Authenticated By: Janeece Riggers, M.D.    US Venous Img Upper Uni Left  05/17/2012  *RADIOLOGY REPORT*  Clinical Data: Left arm swelling question deep venous thrombosis  LEFT UPPER EXTREMITY VENOUS DUPLEX ULTRASOUND  Technique:  Gray-scale sonography with graded compression, as well as color Doppler and duplex ultrasound were performed to evaluate  the upper extremity deep venous system from the level of the subclavian vein and including the jugular, axillary, basilic and upper cephalic vein.  Spectral Doppler was utilized to evaluate flow at rest and with distal augmentation maneuvers.  Comparison:  None  Findings: Deep venous system of the left upper extremity is patent and compressible. Spontaneous venous flow is present with respiratory phasicity and augmentation. No intraluminal thrombus identified. Visualized portions of the left subclavian and left jugular veins appear patent. Subcutaneous edema identified at the left forearm. Visualized superficial veins appear patent.  IMPRESSION: No evidence of deep venous thrombosis in left upper extremity.   Original Report Authenticated By: Ulyses Southward, M.D.    Dg Chest Port 1 View  05/16/2012  *RADIOLOGY REPORT*  Clinical Data: Pneumonia.  Shortness of breath.  PORTABLE CHEST - 1 VIEW  Comparison: 05/15/2012.  Findings: Trachea is midline.  There is mild soft tissue fullness in the superior mediastinum which appears unchanged over multiple prior examinations.  Pulmonary arteries are enlarged as is the heart.  Mild bibasilar air space disease is unchanged, right greater than left. Possible mild right perihilar air space disease. No definite pleural fluid.  IMPRESSION:  1.  No interval change in mild bibasilar air space disease. Difficult to exclude mild right perihilar air space disease. 2.  Pulmonary arterial hypertension.   Original Report Authenticated By: Leanna Battles, M.D.    Dg Chest Portable 1 View  05/15/2012  *RADIOLOGY REPORT*  Clinical Data: Cough, shortness of breath, chest pain, fever and chills.  History of smoking and asthma.  PORTABLE CHEST - 1 VIEW  Comparison: Chest radiograph performed 03/29/2012, and CTA of the chest performed 03/18/2011  Findings: The lungs are well-aerated.  Mild right midlung and right basilar airspace opacity could reflect mild pneumonia.  There is no evidence of  pleural effusion or pneumothorax.  Pulmonary vascularity is at the upper limits of normal.  The cardiomediastinal silhouette is mildly enlarged.  No acute osseous abnormalities are seen.  IMPRESSION:  1.  Mild right midlung and right basilar airspace opacity could reflect mild pneumonia. 2.  Mild cardiomegaly.   Original Report Authenticated By: Tonia Ghent, M.D.     Microbiology: Recent Results (from the past 240 hour(s))  CULTURE, BLOOD (ROUTINE X 2)     Status: None   Collection Time    05/15/12 12:30 AM  Result Value Range Status   Specimen Description BLOOD RIGHT ANTECUBITAL   Final   Special Requests     Final   Value: BOTTLES DRAWN AEROBIC AND ANAEROBIC AEB=8CC ANA=6CC   Culture NO GROWTH 5 DAYS   Final   Report Status 05/20/2012 FINAL   Final  CULTURE, BLOOD (ROUTINE X 2)     Status: None   Collection Time    05/15/12 12:31 AM      Result Value Range Status   Specimen Description BLOOD LEFT HAND DRAWN BY RN PW   Final   Special Requests BOTTLES DRAWN AEROBIC ONLY 5CC   Final   Culture NO GROWTH 5 DAYS   Final   Report Status 05/20/2012 FINAL   Final  MRSA PCR SCREENING     Status: None   Collection Time    05/15/12  3:49 AM      Result Value Range Status   MRSA by PCR NEGATIVE  NEGATIVE Final   Comment:            The GeneXpert MRSA Assay (FDA     approved for NASAL specimens     only), is one component of a     comprehensive MRSA colonization     surveillance program. It is not     intended to diagnose MRSA     infection nor to guide or     monitor treatment for     MRSA infections.  CLOSTRIDIUM DIFFICILE BY PCR     Status: None   Collection Time    05/16/12  4:59 PM      Result Value Range Status   C difficile by pcr NEGATIVE  NEGATIVE Final     Labs: Basic Metabolic Panel:  Recent Labs Lab 05/15/12 0423 05/16/12 0443 05/17/12 0620 05/18/12 0605 05/19/12 0608 05/20/12 0533  NA 139 136 132* 134* 134* 136  K 2.8* 4.7 5.8* 5.4* 4.7 5.1  CL 96 98  97 99 99 100  CO2 32 30 29 28 28 30   GLUCOSE 113* 93 142* 93 94 93  BUN 29* 22 25* 22 18 14   CREATININE 1.26 0.98 1.16 1.01 0.92 0.99  CALCIUM 8.8 9.2 9.5 9.6 9.3 9.5  MG 1.8  --   --   --   --   --    Liver Function Tests:  Recent Labs Lab 05/15/12 0423 05/18/12 0605  AST 23 16  ALT 10 13  ALKPHOS 65 75  BILITOT 1.1 0.4  PROT 7.1 7.3  ALBUMIN 2.9* 2.8*     CBC:  Recent Labs Lab 05/15/12 0020 05/15/12 0423 05/18/12 0605  WBC 12.1* 13.4* 10.7*  HGB 11.5* 9.9* 10.2*  HCT 36.0* 31.4* 31.7*  MCV 90.5 90.8 90.6  PLT 213 212 247   Cardiac Enzymes:  Recent Labs Lab 05/15/12 0020  TROPONINI <0.30   BNP: BNP (last 3 results)  Recent Labs  03/29/12 1241 05/15/12 0020 05/16/12 0445  PROBNP 3788.0* 4353.0* 2044.0*         Signed:  GOSRANI,NIMISH C  [facility] 05/20/2012, 1:15 PM

## 2012-05-20 NOTE — Progress Notes (Signed)
Patient is currently active with [facility] Care Management for chronic disease management services.  Patient has been engaged by a [employer] and LCSW.  Our community based plan of care has focused on disease management of CHF, medication procurement/adherence, HTN, COPD, and community resource support.  Prior to admission patient was weighing and monitoring his blood pressure routinely.  However he continues to take his medicines for BP and COPD management inconsistently.  In home medication and disease process education efforts continue.  Patient will receive a post discharge transition of care call and will be evaluated for monthly home visits for assessments and disease process education.  Made inpatient Case Manager Rosemary Holms RN aware that [facility] Care Management following. Of note, [facility] Care Management services does not replace or interfere with any services that are arranged by inpatient case management or social work.  For additional questions or referrals please contact Anibal Henderson BSN RN [facility] Liaison at [phone].

## 2012-05-20 NOTE — Progress Notes (Signed)
ANTICOAGULATION CONSULT NOTE   Pharmacy Consult for Coumadin Indication: atrial fibrillation  No Known Allergies  Patient Measurements: Height: 5\' 6"  (167.6 cm) Weight: 158 lb 4.6 oz (71.8 kg) IBW/kg (Calculated) : 63.8  Vital Signs: Temp: 100.8 F (38.2 C) (03/10 0538) Temp src: Oral (03/10 0538) BP: 115/71 mmHg (03/10 0538) Pulse Rate: 81 (03/10 0538)  Labs:  Recent Labs  05/18/12 0605 05/19/12 0608 05/20/12 0533  HGB 10.2*  --   --   HCT 31.7*  --   --   PLT 247  --   --   LABPROT 30.6* 28.1* 24.8*  INR 3.14* 2.80* 2.37*  CREATININE 1.01 0.92 0.99   Estimated Creatinine Clearance: 60.9 ml/min (by C-G formula based on Cr of 0.99).  Medical History: Past Medical History  Diagnosis Date  . Anemia     Hemoglobin 11.6 in 04/2008->resolved   . Edema     Lower Extremity  . Tobacco abuse   . Glaucoma(365)   . Allergic rhinitis   . Atrial fibrillation     Coumadin;asymptomatic bradycardia on telemetry in 12/2009  . CHF (congestive heart failure)     H/o CHF with preserved LV EF; pulmonary hypertension; normal EF in 03/2009  . Hypertension   . Shingles   . Shortness of breath   . COPD (chronic obstructive pulmonary disease)   . Asthma    Medications:  Prescriptions prior to admission  Medication Sig Dispense Refill  . albuterol (PROVENTIL HFA;VENTOLIN HFA) 108 (90 BASE) MCG/ACT inhaler Inhale 2 puffs into the lungs every 4 (four) hours as needed for wheezing or shortness of breath.      Marland Kitchen albuterol (PROVENTIL) (2.5 MG/3ML) 0.083% nebulizer solution Take 3 mLs (2.5 mg total) by nebulization 3 (three) times daily.  75 mL  1  . azithromycin (ZITHROMAX Z-PAK) 250 MG tablet Take 250 mg by mouth daily. Started on 05/13/12 Patient took 2 tablets then takes 1 tablet daily for 4 days      . brimonidine-timolol (COMBIGAN) 0.2-0.5 % ophthalmic solution Place 1 drop into both eyes every 12 (twelve) hours.        . budesonide-formoterol (SYMBICORT) 160-4.5 MCG/ACT inhaler Inhale  2 puffs into the lungs 2 (two) times daily.      . furosemide (LASIX) 40 MG tablet Take 1 tablet (40 mg total) by mouth daily.  60 tablet  3  . lisinopril (PRINIVIL,ZESTRIL) 5 MG tablet Take 1 tablet (5 mg total) by mouth daily.  30 tablet  1  . montelukast (SINGULAIR) 10 MG tablet Take 10 mg by mouth at bedtime.      . potassium chloride (K-DUR) 10 MEQ tablet Take 10 mEq by mouth daily.      . temazepam (RESTORIL) 15 MG capsule Take 1 capsule (15 mg total) by mouth at bedtime as needed for sleep.  30 capsule  3  . tiotropium (SPIRIVA) 18 MCG inhalation capsule Place 18 mcg into inhaler and inhale daily.      . travoprost, benzalkonium, (TRAVATAN Z) 0.004 % ophthalmic solution Place 1 drop into both eyes at bedtime.       Marland Kitchen warfarin (COUMADIN) 4 MG tablet Take 4-8 mg by mouth daily. Take 2 tablets (8 mg) on Tuesdays and Fridays, then take 1 tablet (4 mg) on Sundays, Mondays,Wednesdays,Thursdays,Saturdays       Assessment: 72yo male on chronic Coumadin for afib.  INR was sub-therapeutic on admission but is now within goal range.  No bleeding noted.    Goal of Therapy:  INR 2-3   Plan: Coumadin 4mg  po x1 today per home regimen INR daily Recommend d/c home on previous home regimen  [place], [facility] 05/20/2012,10:28 AM

## 2012-06-04 ENCOUNTER — Ambulatory Visit: Payer: Medicare Other | Admitting: Family Medicine

## 2012-06-04 ENCOUNTER — Other Ambulatory Visit: Payer: Self-pay | Admitting: Family Medicine

## 2012-06-05 ENCOUNTER — Ambulatory Visit (HOSPITAL_COMMUNITY)
Admission: RE | Admit: 2012-06-05 | Discharge: 2012-06-05 | Disposition: A | Discharge status: Home / Self Care | Payer: Medicare Other | Source: Ambulatory Visit | Attending: Family Medicine | Admitting: Family Medicine

## 2012-06-05 ENCOUNTER — Ambulatory Visit (INDEPENDENT_AMBULATORY_CARE_PROVIDER_SITE_OTHER): Payer: Medicare Other | Admitting: Family Medicine

## 2012-06-05 ENCOUNTER — Encounter: Payer: Self-pay | Admitting: Family Medicine

## 2012-06-05 VITALS — BP 128/82 | HR 120 | Resp 16 | Wt 153.0 lb

## 2012-06-05 DIAGNOSIS — J441 Chronic obstructive pulmonary disease with (acute) exacerbation: Secondary | ICD-10-CM

## 2012-06-05 DIAGNOSIS — J189 Pneumonia, unspecified organism: Secondary | ICD-10-CM

## 2012-06-05 DIAGNOSIS — G4700 Insomnia, unspecified: Secondary | ICD-10-CM

## 2012-06-05 DIAGNOSIS — Z09 Encounter for follow-up examination after completed treatment for conditions other than malignant neoplasm: Secondary | ICD-10-CM | POA: Insufficient documentation

## 2012-06-05 DIAGNOSIS — I10 Essential (primary) hypertension: Secondary | ICD-10-CM

## 2012-06-05 DIAGNOSIS — R972 Elevated prostate specific antigen [PSA]: Secondary | ICD-10-CM

## 2012-06-05 DIAGNOSIS — Z8701 Personal history of pneumonia (recurrent): Secondary | ICD-10-CM | POA: Insufficient documentation

## 2012-06-05 DIAGNOSIS — R609 Edema, unspecified: Secondary | ICD-10-CM

## 2012-06-05 DIAGNOSIS — F17200 Nicotine dependence, unspecified, uncomplicated: Secondary | ICD-10-CM | POA: Insufficient documentation

## 2012-06-05 MED ORDER — TEMAZEPAM 15 MG PO CAPS: 15.0000 mg | ORAL_CAPSULE | Freq: Every evening | ORAL | Status: DC | PRN

## 2012-06-05 NOTE — Patient Instructions (Addendum)
F/U in 4 month  Please get CXR today to re evaluate pneumonia   No refill from me on current pain pills, please use what you have  As needed only. If still need then I will prescribe vicodin  Sleep pills prescribed for 6 month  Keep legs elevated , this will help with swelling

## 2012-06-10 ENCOUNTER — Ambulatory Visit (INDEPENDENT_AMBULATORY_CARE_PROVIDER_SITE_OTHER): Payer: Medicare Other | Admitting: Adult Health

## 2012-06-10 ENCOUNTER — Encounter: Payer: Self-pay | Admitting: Adult Health

## 2012-06-10 ENCOUNTER — Ambulatory Visit (INDEPENDENT_AMBULATORY_CARE_PROVIDER_SITE_OTHER): Payer: Medicare Other | Admitting: *Deleted

## 2012-06-10 VITALS — BP 130/68 | HR 68 | Ht 66.0 in | Wt 152.4 lb

## 2012-06-10 DIAGNOSIS — I509 Heart failure, unspecified: Secondary | ICD-10-CM

## 2012-06-10 DIAGNOSIS — J441 Chronic obstructive pulmonary disease with (acute) exacerbation: Secondary | ICD-10-CM

## 2012-06-10 DIAGNOSIS — I4891 Unspecified atrial fibrillation: Secondary | ICD-10-CM

## 2012-06-10 DIAGNOSIS — Z7901 Long term (current) use of anticoagulants: Secondary | ICD-10-CM

## 2012-06-10 DIAGNOSIS — I10 Essential (primary) hypertension: Secondary | ICD-10-CM

## 2012-06-10 LAB — POCT INR: INR: 3.1

## 2012-06-10 NOTE — Assessment & Plan Note (Signed)
Repeat cxr shows resolution

## 2012-06-10 NOTE — Assessment & Plan Note (Signed)
He continues to have a chronic cough. He states it is a little worse today. I have advised him to use his inhalers as directed including the when necessary dosing. During the pollen allergy season, he may need to use it more often. He is to follow with Dr. Juanetta Gosling as directed.

## 2012-06-10 NOTE — Assessment & Plan Note (Signed)
He is doing well. Weight remains stable. No evidence of decompensation. He has a chronic right leg lower extremity edema. No evidence of DVT. We will continue him on his current medication and close followup. He tends to improve and stay compliant with encouragement and monthly visits. I reiterated medical compliance and have gone over his medications with him again. He verbalizes understanding.

## 2012-06-10 NOTE — Assessment & Plan Note (Signed)
Still needs to be seen by urology

## 2012-06-10 NOTE — Assessment & Plan Note (Signed)
Excellent control of blood pressure today. We will not make any changes at this time. I reviewed labs as above.

## 2012-06-10 NOTE — Progress Notes (Signed)
  Subjective:    Patient ID: Theodore [name], male    DOB: 17-Feb-1941, 72 y.o.   MRN: [id number]  HPI  Pt in for f/u recent hospitalization for health care associated pneumonia. Denies fever, chills, sputum [production. Feels better as far as his chest is concerned. C/o right leg swelling, denies PND , or orthopnea.He is on  Chronic anticoagulation  Review of Systems See HPI Denies recent fever or chills. Denies sinus pressure, nasal congestion, ear pain or sore throat. Denies chest congestion, productive cough or wheezing. Denies chest pains, palpitations and leg swelling Denies abdominal pain, nausea, vomiting,diarrhea or constipation.   Denies dysuria, frequency, hesitancy or incontinence. Denies joint pain, swelling and limitation in mobility. Denies headaches, seizures, numbness, or tingling. Denies depression, anxiety or insomnia. Denies skin break down or rash.        Objective:   Physical Exam  Patient alert and oriented and in no cardiopulmonary distress.  HEENT: No facial asymmetry, EOMI, no sinus tenderness,  oropharynx pink and moist.  Neck supple no adenopathy.  Chest: Clear to auscultation bilaterally.Decreased air entry thrughout  CVS: S1, S2 no murmurs, no S3.  ABD: Soft non tender. Bowel sounds normal.  Ext: 2 plus edema right lower ext, pitting, no calf tenderness  MS: Adequate ROM spine, shoulders, hips and knees.  Skin: Intact, no ulcerations or rash noted.  Psych: Good eye contact, normal affect. Memory intact not anxious or depressed appearing.  CNS: CN 2-12 intact, power, tone and sensation normal throughout.       Assessment & Plan:

## 2012-06-10 NOTE — Assessment & Plan Note (Signed)
Heart rate remains irregular. Rate is controlled. He is due to have PT/INR checked today.

## 2012-06-10 NOTE — Progress Notes (Deleted)
Name: Theodore [name]    DOB: June 09, 1940  Age: 72 y.o.  MR#: [id number]       PCP:  Syliva Overman, MD      Insurance: Payor: Cleatrice Burke MEDICARE  Plan: [employer] MEDICARE COMPLETE  Product Type: *No Product type*    CC:    Chief Complaint  Patient presents with  . Atrial Fibrillation  . Congestive Heart Failure    Diastolic    VS Filed Vitals:   06/10/12 1248  BP: 130/68  Pulse: 68  Height: 5\' 6"  (1.676 m)  Weight: 152 lb 6.4 oz (69.128 kg)    Weights Current Weight  06/10/12 152 lb 6.4 oz (69.128 kg)  06/05/12 153 lb (69.4 kg)  05/20/12 158 lb 4.6 oz (71.8 kg)    Blood Pressure  BP Readings from Last 3 Encounters:  06/10/12 130/68  06/05/12 128/82  05/20/12 115/71     Admit date:  (Not on file) Last encounter with RMR:  05/10/2012   Allergy Review of patient's allergies indicates no known allergies.  Current Outpatient Prescriptions  Medication Sig Dispense Refill  . albuterol (PROVENTIL HFA;VENTOLIN HFA) 108 (90 BASE) MCG/ACT inhaler Inhale 2 puffs into the lungs every 4 (four) hours as needed for wheezing or shortness of breath.      Marland Kitchen albuterol (PROVENTIL) (2.5 MG/3ML) 0.083% nebulizer solution Take 3 mLs (2.5 mg total) by nebulization 3 (three) times daily.  75 mL  1  . brimonidine-timolol (COMBIGAN) 0.2-0.5 % ophthalmic solution Place 1 drop into both eyes every 12 (twelve) hours.        . budesonide-formoterol (SYMBICORT) 160-4.5 MCG/ACT inhaler Inhale 2 puffs into the lungs 2 (two) times daily.      Marland Kitchen diltiazem (CARDIZEM CD) 120 MG 24 hr capsule Take 1 capsule (120 mg total) by mouth daily.  30 capsule  0  . furosemide (LASIX) 40 MG tablet Take 1 tablet (40 mg total) by mouth daily.  60 tablet  3  . montelukast (SINGULAIR) 10 MG tablet Take 10 mg by mouth at bedtime.      Marland Kitchen oxyCODONE (OXY IR/ROXICODONE) 5 MG immediate release tablet Take 1 tablet (5 mg total) by mouth every 6 (six) hours as needed.  30 tablet  0  . potassium chloride (K-DUR) 10 MEQ tablet  Take 10 mEq by mouth daily.      . temazepam (RESTORIL) 15 MG capsule Take 1 capsule (15 mg total) by mouth at bedtime as needed for sleep.  30 capsule  5  . tiotropium (SPIRIVA) 18 MCG inhalation capsule Place 18 mcg into inhaler and inhale daily.      . travoprost, benzalkonium, (TRAVATAN Z) 0.004 % ophthalmic solution Place 1 drop into both eyes at bedtime.       Marland Kitchen warfarin (COUMADIN) 4 MG tablet Take 4-8 mg by mouth daily. Take 2 tablets (8 mg) on Tuesdays and Fridays, then take 1 tablet (4 mg) on Sundays, Mondays,Wednesdays,Thursdays,Saturdays       No current facility-administered medications for this visit.    Discontinued Meds:   There are no discontinued medications.  Patient Active Problem List  Diagnosis  . GOUT  . ANEMIA-NOS  . ERECTILE DYSFUNCTION  . HYPERTENSION  . Atrial fibrillation  . Acute exacerbation of CHF (congestive heart failure)  . ALLERGIC RHINITIS  . CHRONIC OBSTRUCTIVE PULMONARY DISEASE, ACUTE EXACERBATION  . INGUINAL HERNIA, LEFT  . LOW BACK PAIN, ACUTE  . TRIGGER FINGER  . EDEMA LEG  . Encounter for long-term (current) use  of other medications  . Special screening for malignant neoplasms, colon  . Need for prophylactic vaccination and inoculation against influenza  . Long term current use of anticoagulant  . Shingles  . Tobacco abuse disorder  . Bronchitis  . Elevated PSA  . COPD (chronic obstructive pulmonary disease)  . Glaucoma  . Hypoxia  . Acute bronchitis  . Bradycardia  . Diastolic CHF with preserved left ventricular function, NYHA class 4  . Insomnia  . HCAP (healthcare-associated pneumonia)  . Hypokalemia  . Acute respiratory failure  . Atrial fibrillation with rapid ventricular response    LABS    Component Value Date/Time   NA 136 05/20/2012 0533   NA 134* 05/19/2012 0608   NA 134* 05/18/2012 0605   K 5.1 05/20/2012 0533   K 4.7 05/19/2012 0608   K 5.4* 05/18/2012 0605   CL 100 05/20/2012 0533   CL 99 05/19/2012 0608   CL 99  05/18/2012 0605   CO2 30 05/20/2012 0533   CO2 28 05/19/2012 0608   CO2 28 05/18/2012 0605   GLUCOSE 93 05/20/2012 0533   GLUCOSE 94 05/19/2012 0608   GLUCOSE 93 05/18/2012 0605   BUN 14 05/20/2012 0533   BUN 18 05/19/2012 0608   BUN 22 05/18/2012 0605   CREATININE 0.99 05/20/2012 0533   CREATININE 0.92 05/19/2012 0608   CREATININE 1.01 05/18/2012 0605   CREATININE 1.03 04/11/2012 1405   CALCIUM 9.5 05/20/2012 0533   CALCIUM 9.3 05/19/2012 0608   CALCIUM 9.6 05/18/2012 0605   GFRNONAA 80* 05/20/2012 0533   GFRNONAA 82* 05/19/2012 0608   GFRNONAA 72* 05/18/2012 0605   GFRAA >90 05/20/2012 0533   GFRAA >90 05/19/2012 0608   GFRAA 84* 05/18/2012 0605   CMP     Component Value Date/Time   NA 136 05/20/2012 0533   K 5.1 05/20/2012 0533   CL 100 05/20/2012 0533   CO2 30 05/20/2012 0533   GLUCOSE 93 05/20/2012 0533   BUN 14 05/20/2012 0533   CREATININE 0.99 05/20/2012 0533   CREATININE 1.03 04/11/2012 1405   CALCIUM 9.5 05/20/2012 0533   PROT 7.3 05/18/2012 0605   ALBUMIN 2.8* 05/18/2012 0605   AST 16 05/18/2012 0605   ALT 13 05/18/2012 0605   ALKPHOS 75 05/18/2012 0605   BILITOT 0.4 05/18/2012 0605   GFRNONAA 80* 05/20/2012 0533   GFRAA >90 05/20/2012 0533       Component Value Date/Time   WBC 10.7* 05/18/2012 0605   WBC 13.4* 05/15/2012 0423   WBC 12.1* 05/15/2012 0020   HGB 10.2* 05/18/2012 0605   HGB 9.9* 05/15/2012 0423   HGB 11.5* 05/15/2012 0020   HCT 31.7* 05/18/2012 0605   HCT 31.4* 05/15/2012 0423   HCT 36.0* 05/15/2012 0020   MCV 90.6 05/18/2012 0605   MCV 90.8 05/15/2012 0423   MCV 90.5 05/15/2012 0020    Lipid Panel     Component Value Date/Time   CHOL  Value: 123        ATP III CLASSIFICATION:  <200     mg/dL   Desirable  161-096  mg/dL   Borderline High  >=045    mg/dL   High        40/11/8117 0620   TRIG 68 12/19/2009 0620   HDL 38* 12/19/2009 0620   CHOLHDL 3.2 12/19/2009 0620   VLDL 14 12/19/2009 0620   LDLCALC  Value: 71        Total Cholesterol/HDL:CHD Risk Coronary Heart Disease Risk Table  Men   Women   1/2 Average Risk   3.4   3.3  Average Risk       5.0   4.4  2 X Average Risk   9.6   7.1  3 X Average Risk  23.4   11.0        Use the calculated Patient Ratio above and the CHD Risk Table to determine the patient's CHD Risk.        ATP III CLASSIFICATION (LDL):  <100     mg/dL   Optimal  161-096  mg/dL   Near or Above                    Optimal  130-159  mg/dL   Borderline  045-409  mg/dL   High  >811     mg/dL   Very High 91/06/7827 5621    ABG    Component Value Date/Time   PHART 7.415 05/15/2012 0145   PCO2ART 46.9* 05/15/2012 0145   PO2ART 104.0* 05/15/2012 0145   HCO3 29.4* 05/15/2012 0145   TCO2 26.8 05/15/2012 0145   O2SAT 98.9 05/15/2012 0145     Lab Results  Component Value Date   TSH 1.179 03/18/2012   BNP (last 3 results)  Recent Labs  03/29/12 1241 05/15/12 0020 05/16/12 0445  PROBNP 3788.0* 4353.0* 2044.0*   Cardiac Panel (last 3 results) No results found for this basename: CKTOTAL, CKMB, TROPONINI, RELINDX,  in the last 72 hours  Iron/TIBC/Ferritin No results found for this basename: iron, tibc, ferritin     EKG Orders placed during the hospital encounter of 05/15/12  . EKG 12-LEAD  . EKG 12-LEAD  . EKG     Prior Assessment and Plan Problem List as of 06/10/2012     ICD-9-CM     Cardiology Problems   Acute exacerbation of CHF (congestive heart failure)   Last Assessment & Plan   04/11/2012 Office Visit Written 04/11/2012  2:17 PM by Jodelle Gross, NP     Appears well compensated today. He is trying to avoid salty foods, but continues to struggle with this. He is taking more control of his own health maintenance with daily wts and BP checks. I will refer him to [facility] for ongoing support and education to keep him compliant and motivated, with close follow up our office.  I have spoken to him about [facility] and he is willing to have them come by and talk with him.    HYPERTENSION   Last Assessment & Plan   06/05/2012 Office Visit Written 06/10/2012 12:51 PM by Kerri Perches, MD     Controlled, no change in medication DASH diet and commitment to daily physical activity for a minimum of 30 minutes discussed and encouraged, as a part of hypertension management. The importance of attaining a healthy weight is also discussed.     Atrial fibrillation   Last Assessment & Plan   05/10/2012 Office Visit Written 05/10/2012  1:28 PM by Jodelle Gross, NP     Heart rate is very well controlled today. He remains medically compliant. I am encouraged that he is has taken interest in his own health maintanence. INR today is 2.1.    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  He is medically complaint. I have given him lots of encouragement to continue his healthy lifestyle. I will see him again in 6 weeks. He should continue daily wts and avoid salt. He verbalizes understanding.    Atrial fibrillation with rapid ventricular response     Other   Tobacco abuse disorder   Last Assessment & Plan   04/11/2012 Office Visit Written 04/11/2012  2:18 PM by Jodelle Gross, NP     He is cutting down on smoking, but has not completely quit yet.     Glaucoma   GOUT   ANEMIA-NOS   ERECTILE DYSFUNCTION   ALLERGIC RHINITIS   CHRONIC OBSTRUCTIVE PULMONARY DISEASE, ACUTE EXACERBATION   Last Assessment & Plan   06/05/2012 Office Visit Written 06/10/2012 12:52 PM by Kerri Perches, MD     Currently stable and controlled no med change    INGUINAL HERNIA, LEFT   LOW BACK PAIN, ACUTE   TRIGGER FINGER   EDEMA LEG   Last Assessment & Plan   06/05/2012 Office Visit Written 06/10/2012 12:52 PM by Kerri Perches, MD     Leg elevation and will write for compression hose for right leg only    Encounter for long-term (current) use of other medications   Special screening for malignant  neoplasms, colon   Need for prophylactic vaccination and inoculation against influenza   Long term current use of anticoagulant   Shingles   Last Assessment & Plan   08/29/2010 Office Visit Written 09/14/2010  3:15 PM by Syliva Overman, MD     Diagnosed on the day of the visit in the Ed, he is to start medication, and advised not to scratch the rash    Bronchitis   Last Assessment & Plan   05/11/2011 Office Visit Written 05/14/2011  7:20 AM by Kerri Perches, MD     Acute symptoms, antibiotic prescribed    Elevated PSA   Last Assessment & Plan   04/02/2012 Office Visit Written 04/21/2012  1:31 PM by Kerri Perches, MD     Still needs urology evaluation for this problem,    COPD (chronic obstructive pulmonary disease)   Last Assessment & Plan   04/02/2012 Office Visit Written 04/21/2012  1:29 PM by Kerri Perches, MD     Currently stable, pt back to baseline following recent hospitalization    Hypoxia   Acute bronchitis   Last Assessment & Plan   04/02/2012 Office Visit Written 04/21/2012  1:29 PM by Kerri Perches, MD     Treated during recent hospitalization    Bradycardia   Insomnia   Last Assessment & Plan   04/02/2012 Office Visit Written 04/21/2012  1:32 PM by Kerri Perches, MD     Uncontrolled, sleep hygiene reviewed and restoril to be refilled    HCAP (healthcare-associated pneumonia)   Last Assessment & Plan   06/05/2012 Office Visit Written 06/10/2012 12:52 PM by Kerri Perches, MD     Repeat cxr shows resolution    Hypokalemia   Acute respiratory failure       Imaging: Dg Chest 1 View  05/18/2012  *RADIOLOGY REPORT*  Clinical Data: Pneumonia.  Shortness of breath.  CHEST - 1 VIEW  Comparison: Chest 05/16/2012.  Findings: There is cardiomegaly.  The lungs are emphysematous. Right basilar airspace disease persists.  Fullness of the right hilum appears unchanged.  No pneumothorax identified.  IMPRESSION:  1.  No marked change in the right basilar airspace  disease  worrisome for pneumonia. 2.  Emphysema. 3.  Cardiomegaly.   Original Report Authenticated By: Holley Dexter, M.D.    Dg Chest 2 View  06/05/2012  *RADIOLOGY REPORT*  Clinical Data: History of pneumonia, smoker  CHEST - 2 VIEW  Comparison: 05/18/2012; 05/16/2012; 05/15/2012; 03/15/2012; 03/18/2011; chest CT - 03/18/2011  Findings:  Grossly unchanged enlarged cardiac silhouette and mediastinal contours with mild tortuosity of the thoracic aorta.  The lungs remain hyperexpanded with flattening of the bilateral hemidiaphragms.  Improved aeration of the right lower lung with persistent minimal chronic right basilar linear opacities favored to represent atelectasis or scar.  No focal airspace opacities.  No definite pleural effusion or pneumothorax.  Unchanged bones.  IMPRESSION: 1.  Resolved right lower lobe air space opacities with residual chronic right basilar linear heterogeneous opacities favored to represent atelectasis or scar. 2. Cardiomegaly and hyperexpanded lungs without acute cardiopulmonary disease.   Original Report Authenticated By: Tacey Ruiz, MD    US Venous Img Lower Unilateral Right  05/18/2012  *RADIOLOGY REPORT*  Clinical Data: Right leg pain  RIGHT LOWER EXTREMITY VENOUS DUPLEX ULTRASOUND  Technique:  Gray-scale sonography with graded compression, as well as color Doppler and duplex ultrasound were performed to evaluate the deep venous system of the lower extremity from the level of the common femoral vein through the popliteal and proximal calf veins. Spectral Doppler was utilized to evaluate flow at rest and with distal augmentation maneuvers.  Comparison:  None.  Findings:  Normal compressibility of the common femoral, superficial femoral, and popliteal veins is demonstrated, as well as the visualized proximal calf veins.  No filling defects to suggest DVT on grayscale or color Doppler imaging.  Doppler waveforms show normal direction of venous flow, normal respiratory phasicity  and response to augmentation.  IMPRESSION: No evidence of lower extremity deep vein thrombosis.   Original Report Authenticated By: Janeece Riggers, M.D.    US Venous Img Upper Uni Left  05/17/2012  *RADIOLOGY REPORT*  Clinical Data: Left arm swelling question deep venous thrombosis  LEFT UPPER EXTREMITY VENOUS DUPLEX ULTRASOUND  Technique:  Gray-scale sonography with graded compression, as well as color Doppler and duplex ultrasound were performed to evaluate the upper extremity deep venous system from the level of the subclavian vein and including the jugular, axillary, basilic and upper cephalic vein.  Spectral Doppler was utilized to evaluate flow at rest and with distal augmentation maneuvers.  Comparison:  None  Findings: Deep venous system of the left upper extremity is patent and compressible. Spontaneous venous flow is present with respiratory phasicity and augmentation. No intraluminal thrombus identified. Visualized portions of the left subclavian and left jugular veins appear patent. Subcutaneous edema identified at the left forearm. Visualized superficial veins appear patent.  IMPRESSION: No evidence of deep venous thrombosis in left upper extremity.   Original Report Authenticated By: Ulyses Southward, M.D.    Dg Chest Port 1 View  05/16/2012  *RADIOLOGY REPORT*  Clinical Data: Pneumonia.  Shortness of breath.  PORTABLE CHEST - 1 VIEW  Comparison: 05/15/2012.  Findings: Trachea is midline.  There is mild soft tissue fullness in the superior mediastinum which appears unchanged over multiple prior examinations.  Pulmonary arteries are enlarged as is the heart.  Mild bibasilar air space disease is unchanged, right greater than left. Possible mild right perihilar air space disease. No definite pleural fluid.  IMPRESSION:  1.  No interval change in mild bibasilar air space disease. Difficult to exclude mild right perihilar air space disease. 2.  Pulmonary arterial hypertension.   Original Report Authenticated By:  Leanna Battles, M.D.    Dg Chest Portable 1 View  05/15/2012  *RADIOLOGY REPORT*  Clinical Data: Cough, shortness of breath, chest pain, fever and chills.  History of smoking and asthma.  PORTABLE CHEST - 1 VIEW  Comparison: Chest radiograph performed 03/29/2012, and CTA of the chest performed 03/18/2011  Findings: The lungs are well-aerated.  Mild right midlung and right basilar airspace opacity could reflect mild pneumonia.  There is no evidence of pleural effusion or pneumothorax.  Pulmonary vascularity is at the upper limits of normal.  The cardiomediastinal silhouette is mildly enlarged.  No acute osseous abnormalities are seen.  IMPRESSION:  1.  Mild right midlung and right basilar airspace opacity could reflect mild pneumonia. 2.  Mild cardiomegaly.   Original Report Authenticated By: Tonia Ghent, M.D.

## 2012-06-10 NOTE — Assessment & Plan Note (Signed)
Leg elevation and will write for compression hose for right leg only

## 2012-06-10 NOTE — Assessment & Plan Note (Signed)
Currently stable and controlled no med change

## 2012-06-10 NOTE — Assessment & Plan Note (Signed)
Sleep hygiene reviewed. Med prescribed

## 2012-06-10 NOTE — Assessment & Plan Note (Signed)
Controlled, no change in medication DASH diet and commitment to daily physical activity for a minimum of 30 minutes discussed and encouraged, as a part of hypertension management. The importance of attaining a healthy weight is also discussed.  

## 2012-06-10 NOTE — Progress Notes (Signed)
HPI [name] is a 72 year old patient of Dr. Dietrich Pates we are following for ongoing assessment and management of atrial fibrillation, hypertension, and diastolic CHF with preserved LV function he is followed monthly due to medical noncompliance issues along with dietary noncompliance. On last visit he was sent for a diagnostic chest x-ray to rule out pneumonia in the setting of frequent coughing and congestion. This was found to be negative. He was last seen on 05/10/2012 and was without complaint of edema and was doing his best to stay away from salty foods. He is also followed in our Coumadin clinic for Coumadin dosing. Last visit INR was 2.1.   He comes today without complaints. He is compliant with his medications. His weight is essentially the same. He does have an occasional nonproductive cough. He uses inhalers when his congestion worsens.  No Known Allergies  Current Outpatient Prescriptions  Medication Sig Dispense Refill  . albuterol (PROVENTIL HFA;VENTOLIN HFA) 108 (90 BASE) MCG/ACT inhaler Inhale 2 puffs into the lungs every 4 (four) hours as needed for wheezing or shortness of breath.      Marland Kitchen albuterol (PROVENTIL) (2.5 MG/3ML) 0.083% nebulizer solution Take 3 mLs (2.5 mg total) by nebulization 3 (three) times daily.  75 mL  1  . brimonidine-timolol (COMBIGAN) 0.2-0.5 % ophthalmic solution Place 1 drop into both eyes every 12 (twelve) hours.        . budesonide-formoterol (SYMBICORT) 160-4.5 MCG/ACT inhaler Inhale 2 puffs into the lungs 2 (two) times daily.      Marland Kitchen diltiazem (CARDIZEM CD) 120 MG 24 hr capsule Take 1 capsule (120 mg total) by mouth daily.  30 capsule  0  . furosemide (LASIX) 40 MG tablet Take 1 tablet (40 mg total) by mouth daily.  60 tablet  3  . montelukast (SINGULAIR) 10 MG tablet Take 10 mg by mouth at bedtime.      Marland Kitchen oxyCODONE (OXY IR/ROXICODONE) 5 MG immediate release tablet Take 1 tablet (5 mg total) by mouth every 6 (six) hours as needed.  30 tablet  0  .  potassium chloride (K-DUR) 10 MEQ tablet Take 10 mEq by mouth daily.      . temazepam (RESTORIL) 15 MG capsule Take 1 capsule (15 mg total) by mouth at bedtime as needed for sleep.  30 capsule  5  . tiotropium (SPIRIVA) 18 MCG inhalation capsule Place 18 mcg into inhaler and inhale daily.      . travoprost, benzalkonium, (TRAVATAN Z) 0.004 % ophthalmic solution Place 1 drop into both eyes at bedtime.       Marland Kitchen warfarin (COUMADIN) 4 MG tablet Take 4-8 mg by mouth daily. Take 2 tablets (8 mg) on Tuesdays and Fridays, then take 1 tablet (4 mg) on Sundays, Mondays,Wednesdays,Thursdays,Saturdays       No current facility-administered medications for this visit.    Past Medical History  Diagnosis Date  . Anemia     Hemoglobin 11.6 in 04/2008->resolved   . Edema     Lower Extremity  . Tobacco abuse   . Glaucoma(365)   . Allergic rhinitis   . Atrial fibrillation     Coumadin;asymptomatic bradycardia on telemetry in 12/2009  . CHF (congestive heart failure)     H/o CHF with preserved LV EF; pulmonary hypertension; normal EF in 03/2009  . Hypertension   . Shingles   . Shortness of breath   . COPD (chronic obstructive pulmonary disease)   . Asthma     Past Surgical History  Procedure Laterality  Date  . Bilateral cataract surgery    . Herniorrhapy    . Tendon repair      right hand surgical procedure for a tendon repair    NFA:OZHYQM of systems complete and found to be negative unless listed above  PHYSICAL EXAM BP 130/68  Pulse 68  Ht 5\' 6"  (1.676 m)  Wt 152 lb 6.4 oz (69.128 kg)  BMI 24.61 kg/m2  General: Well developed, well nourished, in no acute distress Head: Eyes PERRLA, No xanthomas.   Normal cephalic and atramatic  Lungs: Chronic Bibasilar crackles. No wheezes. Heart: HRIR S1 S2, without MRG.  Pulses are 2+ & equal.            No carotid bruit. No JVD.  No abdominal bruits. No femoral bruits. Abdomen: Bowel sounds are positive, abdomen soft and non-tender without masses or                   Hernia's noted. Msk:  Back normal, normal gait. Normal strength and tone for age. Extremities: No clubbing, cyanosis or 1 + right leg edema.  DP +1 Neuro: Alert and oriented X 3. Psych:  Good affect, responds appropriately   ASSESSMENT AND PLAN

## 2012-06-14 ENCOUNTER — Other Ambulatory Visit: Payer: Self-pay

## 2012-06-14 MED ORDER — TRAMADOL HCL 50 MG PO TABS: 50.0000 mg | ORAL_TABLET | Freq: Two times a day (BID) | ORAL | Status: DC | PRN

## 2012-06-25 ENCOUNTER — Other Ambulatory Visit: Payer: Self-pay

## 2012-06-25 ENCOUNTER — Telehealth: Payer: Self-pay | Admitting: Family Medicine

## 2012-06-25 MED ORDER — DILTIAZEM HCL ER COATED BEADS 120 MG PO CP24: 120.0000 mg | ORAL_CAPSULE | Freq: Every day | ORAL | Status: DC

## 2012-06-26 ENCOUNTER — Other Ambulatory Visit: Payer: Self-pay | Admitting: Cardiology

## 2012-06-26 MED ORDER — DILTIAZEM HCL ER COATED BEADS 120 MG PO CP24: 120.0000 mg | ORAL_CAPSULE | Freq: Every day | ORAL | Status: DC

## 2012-06-26 MED ORDER — FUROSEMIDE 40 MG PO TABS: 40.0000 mg | ORAL_TABLET | Freq: Every day | ORAL | Status: DC

## 2012-06-26 NOTE — Telephone Encounter (Signed)
Meds sent

## 2012-07-08 DIAGNOSIS — J189 Pneumonia, unspecified organism: Secondary | ICD-10-CM

## 2012-07-08 DIAGNOSIS — J449 Chronic obstructive pulmonary disease, unspecified: Secondary | ICD-10-CM

## 2012-07-08 DIAGNOSIS — I509 Heart failure, unspecified: Secondary | ICD-10-CM

## 2012-07-08 DIAGNOSIS — I5030 Unspecified diastolic (congestive) heart failure: Secondary | ICD-10-CM

## 2012-07-09 ENCOUNTER — Ambulatory Visit: Payer: Medicare Other | Admitting: Family Medicine

## 2012-07-10 ENCOUNTER — Encounter: Payer: Self-pay | Admitting: Adult Health

## 2012-07-10 ENCOUNTER — Ambulatory Visit (INDEPENDENT_AMBULATORY_CARE_PROVIDER_SITE_OTHER): Payer: Medicare Other | Admitting: *Deleted

## 2012-07-10 ENCOUNTER — Ambulatory Visit (INDEPENDENT_AMBULATORY_CARE_PROVIDER_SITE_OTHER): Payer: Medicare Other | Admitting: Adult Health

## 2012-07-10 VITALS — BP 147/100 | HR 69 | Ht 66.0 in | Wt 148.0 lb

## 2012-07-10 DIAGNOSIS — Z7901 Long term (current) use of anticoagulants: Secondary | ICD-10-CM

## 2012-07-10 DIAGNOSIS — I509 Heart failure, unspecified: Secondary | ICD-10-CM

## 2012-07-10 DIAGNOSIS — I4891 Unspecified atrial fibrillation: Secondary | ICD-10-CM

## 2012-07-10 DIAGNOSIS — I10 Essential (primary) hypertension: Secondary | ICD-10-CM

## 2012-07-10 DIAGNOSIS — Z720 Tobacco use: Secondary | ICD-10-CM

## 2012-07-10 DIAGNOSIS — F17200 Nicotine dependence, unspecified, uncomplicated: Secondary | ICD-10-CM

## 2012-07-10 LAB — POCT INR: INR: 1.6

## 2012-07-10 NOTE — Assessment & Plan Note (Signed)
No evidence of fluid overload at present but has elevated diastolic BP. He has been out of antihypertensives for over a week. We will change his Rx to 90 day supply with enough doses to last the duration.

## 2012-07-10 NOTE — Assessment & Plan Note (Signed)
Blood pressure is not well controlled with noncompliance of medications as he has run out. I have explained the importance of compliance and need to take his medications. He verbalizes understanding. Will see him again in one month.

## 2012-07-10 NOTE — Patient Instructions (Addendum)
Your physician recommends that you schedule a follow-up appointment in: ONE MONTH  

## 2012-07-10 NOTE — Progress Notes (Signed)
HPI: [name] 72 year old patient of Dr. Dietrich Pates we are following for ongoing assessment and management of atrial fibrillation, hypertension, diastolic CHF with preserved LV function, with medical noncompliance issues and dietary noncompliance. He was last seen March 2014 we are following him monthly to continue close management to prevent frequent readmission. He is followed in our Coumadin clinic also for Coumadin dosing.    He has run out his medications for a week, and coumadin for 3 days. He ran out of money. No complaints of chest pain or dyspnea.   No Known Allergies  Current Outpatient Prescriptions  Medication Sig Dispense Refill  . albuterol (PROVENTIL HFA;VENTOLIN HFA) 108 (90 BASE) MCG/ACT inhaler Inhale 2 puffs into the lungs every 4 (four) hours as needed for wheezing or shortness of breath.      Marland Kitchen albuterol (PROVENTIL) (2.5 MG/3ML) 0.083% nebulizer solution Take 3 mLs (2.5 mg total) by nebulization 3 (three) times daily.  75 mL  1  . brimonidine-timolol (COMBIGAN) 0.2-0.5 % ophthalmic solution Place 1 drop into both eyes every 12 (twelve) hours.        . budesonide-formoterol (SYMBICORT) 160-4.5 MCG/ACT inhaler Inhale 2 puffs into the lungs 2 (two) times daily.      . furosemide (LASIX) 40 MG tablet Take 1 tablet (40 mg total) by mouth daily.  60 tablet  3  . montelukast (SINGULAIR) 10 MG tablet Take 10 mg by mouth at bedtime.      Marland Kitchen oxyCODONE (OXY IR/ROXICODONE) 5 MG immediate release tablet Take 1 tablet (5 mg total) by mouth every 6 (six) hours as needed.  30 tablet  0  . potassium chloride (K-DUR) 10 MEQ tablet Take 10 mEq by mouth daily.      . temazepam (RESTORIL) 15 MG capsule Take 1 capsule (15 mg total) by mouth at bedtime as needed for sleep.  30 capsule  5  . tiotropium (SPIRIVA) 18 MCG inhalation capsule Place 18 mcg into inhaler and inhale daily.      . traMADol (ULTRAM) 50 MG tablet Take 1 tablet (50 mg total) by mouth 2 (two) times daily as needed for pain.  30  tablet  0  . travoprost, benzalkonium, (TRAVATAN Z) 0.004 % ophthalmic solution Place 1 drop into both eyes at bedtime.       Marland Kitchen diltiazem (CARDIZEM CD) 120 MG 24 hr capsule Take 1 capsule (120 mg total) by mouth daily.  30 capsule  5  . warfarin (COUMADIN) 4 MG tablet Take 4-8 mg by mouth daily. Take 2 tablets (8 mg) on Tuesdays and Fridays, then take 1 tablet (4 mg) on Sundays, Mondays,Wednesdays,Thursdays,Saturdays       No current facility-administered medications for this visit.    Past Medical History  Diagnosis Date  . Anemia     Hemoglobin 11.6 in 04/2008->resolved   . Edema     Lower Extremity  . Tobacco abuse   . Glaucoma(365)   . Allergic rhinitis   . Atrial fibrillation     Coumadin;asymptomatic bradycardia on telemetry in 12/2009  . CHF (congestive heart failure)     H/o CHF with preserved LV EF; pulmonary hypertension; normal EF in 03/2009  . Hypertension   . Shingles   . Shortness of breath   . COPD (chronic obstructive pulmonary disease)   . Asthma     Past Surgical History  Procedure Laterality Date  . Bilateral cataract surgery    . Herniorrhapy    . Tendon repair  right hand surgical procedure for a tendon repair    [facility]:QIONGE of systems complete and found to be negative unless listed above  PHYSICAL EXAM BP 147/100  Pulse 69  Ht 5\' 6"  (1.676 m)  Wt 148 lb (67.132 kg)  BMI 23.9 kg/m2  SpO2 96% General: Well developed, well nourished, in no acute distress Head: Eyes PERRLA, No xanthomas.   Normal cephalic and atramatic  Lungs: Clear bilaterally to auscultation and percussion. Heart: HRIR S1 S2, without MRG.  Pulses are 2+ & equal.            No carotid bruit. No JVD.  No abdominal bruits. No femoral bruits. Abdomen: Bowel sounds are positive, abdomen soft and non-tender without masses or                  Hernia's noted. Msk:  Back normal, normal gait. Normal strength and tone for age. Extremities: No clubbing, cyanosis or 1+ edema  In the right  leg.  DP +1 Neuro: Alert and oriented X 3. Psych:  Good affect, responds appropriately  EKG:  ASSESSMENT AND PLAN

## 2012-07-10 NOTE — Assessment & Plan Note (Signed)
Needs to stop buying cigarettes and by his medication instead.

## 2012-07-10 NOTE — Progress Notes (Signed)
Name: Theodore [name]    DOB: Dec 17, 1940  Age: 72 y.o.  MR#: [id number]       PCP:  Syliva Overman, MD      Insurance: Payor: Cleatrice Burke MEDICARE  Plan: [employer] MEDICARE COMPLETE  Product Type: *No Product type*    CC:    Chief Complaint  Patient presents with  . Congestive Heart Failure  . Hypertension  PT STATED THAT HE HAS BEEN OUT OF HIS CARDIZEM 120MG  FOR A WEEK NOW PER UNABLE TO PAY TO PICK REFILLS UP UNTIL THIS Friday 07-12-12, PT ALSO NOTES HE HAS BEEN OUT OF HIS COUMADIN FOR THREE DAYS   VS Filed Vitals:   07/10/12 1306  BP: 147/100  Pulse: 69  Height: 5\' 6"  (1.676 m)  Weight: 148 lb (67.132 kg)  SpO2: 96%    Weights Current Weight  07/10/12 148 lb (67.132 kg)  06/10/12 152 lb 6.4 oz (69.128 kg)  06/05/12 153 lb (69.4 kg)    Blood Pressure  BP Readings from Last 3 Encounters:  07/10/12 147/100  06/10/12 130/68  06/05/12 128/82     Admit date:  (Not on file) Last encounter with RMR:  06/10/2012   Allergy Review of patient's allergies indicates no known allergies.  Current Outpatient Prescriptions  Medication Sig Dispense Refill  . albuterol (PROVENTIL HFA;VENTOLIN HFA) 108 (90 BASE) MCG/ACT inhaler Inhale 2 puffs into the lungs every 4 (four) hours as needed for wheezing or shortness of breath.      Marland Kitchen albuterol (PROVENTIL) (2.5 MG/3ML) 0.083% nebulizer solution Take 3 mLs (2.5 mg total) by nebulization 3 (three) times daily.  75 mL  1  . brimonidine-timolol (COMBIGAN) 0.2-0.5 % ophthalmic solution Place 1 drop into both eyes every 12 (twelve) hours.        . budesonide-formoterol (SYMBICORT) 160-4.5 MCG/ACT inhaler Inhale 2 puffs into the lungs 2 (two) times daily.      . furosemide (LASIX) 40 MG tablet Take 1 tablet (40 mg total) by mouth daily.  60 tablet  3  . montelukast (SINGULAIR) 10 MG tablet Take 10 mg by mouth at bedtime.      Marland Kitchen oxyCODONE (OXY IR/ROXICODONE) 5 MG immediate release tablet Take 1 tablet (5 mg total) by mouth every 6 (six) hours as  needed.  30 tablet  0  . potassium chloride (K-DUR) 10 MEQ tablet Take 10 mEq by mouth daily.      . temazepam (RESTORIL) 15 MG capsule Take 1 capsule (15 mg total) by mouth at bedtime as needed for sleep.  30 capsule  5  . tiotropium (SPIRIVA) 18 MCG inhalation capsule Place 18 mcg into inhaler and inhale daily.      . traMADol (ULTRAM) 50 MG tablet Take 1 tablet (50 mg total) by mouth 2 (two) times daily as needed for pain.  30 tablet  0  . travoprost, benzalkonium, (TRAVATAN Z) 0.004 % ophthalmic solution Place 1 drop into both eyes at bedtime.       Marland Kitchen diltiazem (CARDIZEM CD) 120 MG 24 hr capsule Take 1 capsule (120 mg total) by mouth daily.  30 capsule  5  . warfarin (COUMADIN) 4 MG tablet Take 4-8 mg by mouth daily. Take 2 tablets (8 mg) on Tuesdays and Fridays, then take 1 tablet (4 mg) on Sundays, Mondays,Wednesdays,Thursdays,Saturdays       No current facility-administered medications for this visit.    Discontinued Meds:   There are no discontinued medications.  Patient Active Problem List   Diagnosis Date  Noted  . HCAP (healthcare-associated pneumonia) 05/15/2012  . Hypokalemia 05/15/2012  . Acute respiratory failure 05/15/2012  . Atrial fibrillation with rapid ventricular response 05/15/2012  . Insomnia 04/21/2012  . 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 Bradycardia 03/18/2012  . Acute bronchitis 03/16/2012  . Glaucoma 03/15/2012  . Hypoxia 03/15/2012  . COPD (chronic obstructive pulmonary disease) 10/22/2011  . Bronchitis 05/11/2011  . Elevated PSA 05/11/2011  . Tobacco abuse disorder 12/24/2010  . Shingles 09/14/2010  . Long term current use of anticoagulant 06/08/2010  . Encounter for long-term (current) use of other medications 05/15/2010  . Special screening for malignant neoplasms, colon 05/15/2010  . Need for prophylactic vaccination and inoculation against influenza 05/15/2010  . INGUINAL HERNIA, LEFT 01/24/2010  .  GOUT 11/26/2009  . TRIGGER FINGER 10/01/2008  . LOW BACK PAIN, ACUTE 05/08/2008  . ERECTILE DYSFUNCTION 05/21/2006  . CHRONIC OBSTRUCTIVE PULMONARY DISEASE, ACUTE EXACERBATION 05/11/2006  . EDEMA LEG 05/11/2006  . Acute exacerbation of CHF (congestive heart failure) 05/03/2006  . ANEMIA-NOS 02/13/2006  . HYPERTENSION 02/13/2006  . Atrial fibrillation 02/13/2006  . ALLERGIC RHINITIS 02/13/2006    LABS    Component Value Date/Time   NA 136 05/20/2012 0533   NA 134* 05/19/2012 0608   NA 134* 05/18/2012 0605   K 5.1 05/20/2012 0533   K 4.7 05/19/2012 0608   K 5.4* 05/18/2012 0605   CL 100 05/20/2012 0533   CL 99 05/19/2012 0608   CL 99 05/18/2012 0605   CO2 30 05/20/2012 0533   CO2 28 05/19/2012 0608   CO2 28 05/18/2012 0605   GLUCOSE 93 05/20/2012 0533   GLUCOSE 94 05/19/2012 0608   GLUCOSE 93 05/18/2012 0605   BUN 14 05/20/2012 0533   BUN 18 05/19/2012 0608   BUN 22 05/18/2012 0605   CREATININE 0.99 05/20/2012 0533   CREATININE 0.92 05/19/2012 0608   CREATININE 1.01 05/18/2012 0605   CREATININE 1.03 04/11/2012 1405   CALCIUM 9.5 05/20/2012 0533   CALCIUM 9.3 05/19/2012 0608   CALCIUM 9.6 05/18/2012 0605   GFRNONAA 80* 05/20/2012 0533   GFRNONAA 82* 05/19/2012 0608   GFRNONAA 72* 05/18/2012 0605   GFRAA >90 05/20/2012 0533   GFRAA >90 05/19/2012 0608   GFRAA 84* 05/18/2012 0605   CMP     Component Value Date/Time   NA 136 05/20/2012 0533   K 5.1 05/20/2012 0533   CL 100 05/20/2012 0533   CO2 30 05/20/2012 0533   GLUCOSE 93 05/20/2012 0533   BUN 14 05/20/2012 0533   CREATININE 0.99 05/20/2012 0533   CREATININE 1.03 04/11/2012 1405   CALCIUM 9.5 05/20/2012 0533   PROT 7.3 05/18/2012 0605   ALBUMIN 2.8* 05/18/2012 0605   AST 16 05/18/2012 0605   ALT 13 05/18/2012 0605   ALKPHOS 75 05/18/2012 0605   BILITOT 0.4 05/18/2012 0605   GFRNONAA 80* 05/20/2012 0533   GFRAA >90 05/20/2012 0533       Component Value Date/Time   WBC 10.7* 05/18/2012 0605   WBC 13.4* 05/15/2012 0423   WBC 12.1* 05/15/2012 0020   HGB 10.2* 05/18/2012 0605    HGB 9.9* 05/15/2012 0423   HGB 11.5* 05/15/2012 0020   HCT 31.7* 05/18/2012 0605   HCT 31.4* 05/15/2012 0423   HCT 36.0* 05/15/2012 0020   MCV 90.6 05/18/2012 0605   MCV 90.8 05/15/2012 0423   MCV 90.5 05/15/2012 0020    Lipid Panel     Component Value Date/Time  CHOL  Value: 123        ATP III CLASSIFICATION:  <200     mg/dL   Desirable  191-478  mg/dL   Borderline High  >=295    mg/dL   High        62/03/3084 0620   TRIG 68 12/19/2009 0620   HDL 38* 12/19/2009 0620   CHOLHDL 3.2 12/19/2009 0620   VLDL 14 12/19/2009 0620   LDLCALC  Value: 71        Total Cholesterol/HDL:CHD Risk Coronary Heart Disease Risk Table                     Men   Women  1/2 Average Risk   3.4   3.3  Average Risk       5.0   4.4  2 X Average Risk   9.6   7.1  3 X Average Risk  23.4   11.0        Use the calculated Patient Ratio above and the CHD Risk Table to determine the patient's CHD Risk.        ATP III CLASSIFICATION (LDL):  <100     mg/dL   Optimal  578-469  mg/dL   Near or Above                    Optimal  130-159  mg/dL   Borderline  629-528  mg/dL   High  >413     mg/dL   Very High 24/06/100 7253    ABG    Component Value Date/Time   PHART 7.415 05/15/2012 0145   PCO2ART 46.9* 05/15/2012 0145   PO2ART 104.0* 05/15/2012 0145   HCO3 29.4* 05/15/2012 0145   TCO2 26.8 05/15/2012 0145   O2SAT 98.9 05/15/2012 0145     Lab Results  Component Value Date   TSH 1.179 03/18/2012   BNP (last 3 results)  Recent Labs  03/29/12 1241 05/15/12 0020 05/16/12 0445  PROBNP 3788.0* 4353.0* 2044.0*   Cardiac Panel (last 3 results) No results found for this basename: CKTOTAL, CKMB, TROPONINI, RELINDX,  in the last 72 hours  Iron/TIBC/Ferritin No results found for this basename: iron, tibc, ferritin     EKG Orders placed during the hospital encounter of 05/15/12  . EKG 12-LEAD  . EKG 12-LEAD  . EKG     Prior Assessment and Plan Problem List as of 07/10/2012     ICD-9-CM   Acute exacerbation of CHF (congestive heart failure)   Last  Assessment & Plan   06/10/2012 Office Visit Written 06/10/2012  1:10 PM by Jodelle Gross, NP     He is doing well. Weight remains stable. No evidence of decompensation. He has a chronic right leg lower extremity edema. No evidence of DVT. We will continue him on his current medication and close followup. He tends to improve and stay compliant with encouragement and monthly visits. I reiterated medical compliance and have gone over his medications with him again. He verbalizes understanding.    Tobacco abuse disorder   Last Assessment & Plan   04/11/2012 Office Visit Written 04/11/2012  2:18 PM by Jodelle Gross, NP     He is cutting down on smoking, but has not completely quit yet.     Glaucoma   GOUT   ANEMIA-NOS   ERECTILE DYSFUNCTION   HYPERTENSION   Last Assessment & Plan   06/10/2012 Office Visit Written 06/10/2012  1:10 PM by Bettey Mare  Lawrence, NP     Excellent control of blood pressure today. We will not make any changes at this time. I reviewed labs as above.    Atrial fibrillation   Last Assessment & Plan   05/10/2012 Office Visit Written 05/10/2012  1:28 PM by Jodelle Gross, NP     Heart rate is very well controlled today. He remains medically compliant. I am encouraged that he is has taken interest in his own health maintanence. INR today is 2.1.    ALLERGIC RHINITIS   CHRONIC OBSTRUCTIVE PULMONARY DISEASE, ACUTE EXACERBATION   Last Assessment & Plan   06/10/2012 Office Visit Written 06/10/2012  1:11 PM by Jodelle Gross, NP     He continues to have a chronic cough. He states it is a little worse today. I have advised him to use his inhalers as directed including the when necessary dosing. During the pollen allergy season, he may need to use it more often. He is to follow with Dr. Juanetta Gosling as directed.    INGUINAL HERNIA, LEFT   LOW BACK PAIN, ACUTE   TRIGGER FINGER   EDEMA LEG   Last Assessment & Plan   06/05/2012 Office Visit Written 06/10/2012 12:52 PM by  Kerri Perches, MD     Leg elevation and will write for compression hose for right leg only    Encounter for long-term (current) use of other medications   Special screening for malignant neoplasms, colon   Need for prophylactic vaccination and inoculation against influenza   Long term current use of anticoagulant   Shingles   Last Assessment & Plan   08/29/2010 Office Visit Written 09/14/2010  3:15 PM by Syliva Overman, MD     Diagnosed on the day of the visit in the Ed, he is to start medication, and advised not to scratch the rash    Bronchitis   Last Assessment & Plan   05/11/2011 Office Visit Written 05/14/2011  7:20 AM by Kerri Perches, MD     Acute symptoms, antibiotic prescribed    Elevated PSA   Last Assessment & Plan   06/05/2012 Office Visit Written 06/10/2012 12:53 PM by Kerri Perches, MD     Still needs to be seen by urology    COPD (chronic obstructive pulmonary disease)   Last Assessment & Plan   04/02/2012 Office Visit Written 04/21/2012  1:29 PM by Kerri Perches, MD     Currently stable, pt back to baseline following recent hospitalization    Hypoxia   Acute bronchitis   Last Assessment & Plan   04/02/2012 Office Visit Written 04/21/2012  1:29 PM by Kerri Perches, MD     Treated during recent hospitalization    Bradycardia   Diastolic CHF with preserved left ventricular function, NYHA class 4   Last Assessment & Plan   05/10/2012 Office Visit Written 05/10/2012  1:29 PM by Jodelle Gross, NP     Wt is stable, without evidence of fluid retention on exam. He is medically complaint. I have given him lots of encouragement to continue his healthy lifestyle. I will see him again in 6 weeks. He should continue daily wts and avoid salt. He verbalizes understanding.    Insomnia   Last Assessment & Plan   06/05/2012 Office Visit Written 06/10/2012 12:53 PM by Kerri Perches, MD     Sleep hygiene reviewed. Med prescribed    HCAP  (healthcare-associated pneumonia)   Last Assessment & Plan  06/05/2012 Office Visit Written 06/10/2012 12:52 PM by Kerri Perches, MD     Repeat cxr shows resolution    Hypokalemia   Acute respiratory failure   Atrial fibrillation with rapid ventricular response   Last Assessment & Plan   06/10/2012 Office Visit Written 06/10/2012  1:12 PM by Jodelle Gross, NP     Heart rate remains irregular. Rate is controlled. He is due to have PT/INR checked today.        Imaging: No results found.

## 2012-08-08 NOTE — Progress Notes (Signed)
HPI: Theodore [name] 72 year old patient of Dr. Dietrich Pates we are following for ongoing assessment and management of atrial fibrillation, hypertension, diastolic CHF with preserved LV function, with medical noncompliance issues and dietary noncompliance. He was last seen April of 2014 we are following him monthly to continue close management to prevent frequent readmission. He is followed in our Coumadin clinic also for Coumadin dosing.    He comes today without complaints. He is getting his medications and taking them as directed. No Known Allergies  Current Outpatient Prescriptions  Medication Sig Dispense Refill  . albuterol (PROVENTIL HFA;VENTOLIN HFA) 108 (90 BASE) MCG/ACT inhaler Inhale 2 puffs into the lungs every 4 (four) hours as needed for wheezing or shortness of breath.      Marland Kitchen albuterol (PROVENTIL) (2.5 MG/3ML) 0.083% nebulizer solution Take 3 mLs (2.5 mg total) by nebulization 3 (three) times daily.  75 mL  1  . brimonidine-timolol (COMBIGAN) 0.2-0.5 % ophthalmic solution Place 1 drop into both eyes every 12 (twelve) hours.        . budesonide-formoterol (SYMBICORT) 160-4.5 MCG/ACT inhaler Inhale 2 puffs into the lungs 2 (two) times daily.      Marland Kitchen diltiazem (CARDIZEM CD) 120 MG 24 hr capsule Take 1 capsule (120 mg total) by mouth daily.  30 capsule  5  . furosemide (LASIX) 40 MG tablet Take 1 tablet (40 mg total) by mouth daily.  60 tablet  3  . montelukast (SINGULAIR) 10 MG tablet Take 10 mg by mouth at bedtime.      Marland Kitchen oxyCODONE (OXY IR/ROXICODONE) 5 MG immediate release tablet Take 1 tablet (5 mg total) by mouth every 6 (six) hours as needed.  30 tablet  0  . potassium chloride (K-DUR) 10 MEQ tablet Take 10 mEq by mouth daily.      . temazepam (RESTORIL) 15 MG capsule Take 1 capsule (15 mg total) by mouth at bedtime as needed for sleep.  30 capsule  5  . tiotropium (SPIRIVA) 18 MCG inhalation capsule Place 18 mcg into inhaler and inhale daily.      . traMADol (ULTRAM) 50 MG tablet Take 1  tablet (50 mg total) by mouth 2 (two) times daily as needed for pain.  30 tablet  0  . travoprost, benzalkonium, (TRAVATAN Z) 0.004 % ophthalmic solution Place 1 drop into both eyes at bedtime.       Marland Kitchen warfarin (COUMADIN) 4 MG tablet Take 4-8 mg by mouth daily. Take 2 tablets (8 mg) on Tuesdays and Fridays, then take 1 tablet (4 mg) on Sundays, Mondays,Wednesdays,Thursdays,Saturdays       No current facility-administered medications for this visit.    Past Medical History  Diagnosis Date  . Anemia     Hemoglobin 11.6 in 04/2008->resolved   . Edema     Lower Extremity  . Tobacco abuse   . Glaucoma   . Allergic rhinitis   . Atrial fibrillation     Coumadin;asymptomatic bradycardia on telemetry in 12/2009  . CHF (congestive heart failure)     H/o CHF with preserved LV EF; pulmonary hypertension; normal EF in 03/2009  . Hypertension   . Shingles   . Shortness of breath   . COPD (chronic obstructive pulmonary disease)   . Asthma     Past Surgical History  Procedure Laterality Date  . Bilateral cataract surgery    . Herniorrhapy    . Tendon repair      right hand surgical procedure for a tendon repair    GM[facility]:NUUVOZ  of systems complete and found to be negative unless listed above  PHYSICAL EXAM There were no vitals taken for this visit. General: Well developed, well nourished, in no acute distress Head: Eyes PERRLA, No xanthomas.   Normal cephalic and atramatic  Lungs: Clear bilaterally to auscultation and percussion. Heart: HRRR S1 S2, without MRG.  Pulses are 2+ & equal.            No carotid bruit. No JVD.  No abdominal bruits. No femoral bruits. Abdomen: Bowel sounds are positive, abdomen soft and non-tender without masses or                  Hernia's noted. Msk:  Back normal, normal gait. Normal strength and tone for age. Extremities: No clubbing, cyanosis or edema.  DP +1 Neuro: Alert and oriented X 3. Psych:  Good affect, responds appropriately   ASSESSMENT AND  PLAN

## 2012-08-09 ENCOUNTER — Ambulatory Visit (INDEPENDENT_AMBULATORY_CARE_PROVIDER_SITE_OTHER): Payer: Medicare Other | Admitting: Adult Health

## 2012-08-09 ENCOUNTER — Encounter: Payer: Self-pay | Admitting: Adult Health

## 2012-08-09 DIAGNOSIS — I10 Essential (primary) hypertension: Secondary | ICD-10-CM

## 2012-08-09 DIAGNOSIS — I509 Heart failure, unspecified: Secondary | ICD-10-CM

## 2012-08-09 NOTE — Assessment & Plan Note (Signed)
Continue to work on dietary noncompliance issues. BP up today but he did not take his lasix because of appt.I have reinforced low sodium diet.

## 2012-08-09 NOTE — Progress Notes (Deleted)
Name: Theodore [name]    DOB: 1940-05-23  Age: 72 y.o.  MR#: [id number]       PCP:  Syliva Overman, MD      Insurance: Payor: Cleatrice Burke MEDICARE / Plan: AARP MEDICARE COMPLETE / Product Type: *No Product type* /   CC:   No chief complaint on file.   VS There were no vitals filed for this visit.  Weights Current Weight  07/10/12 148 lb (67.132 kg)  06/10/12 152 lb 6.4 oz (69.128 kg)  06/05/12 153 lb (69.4 kg)    Blood Pressure  BP Readings from Last 3 Encounters:  07/10/12 147/100  06/10/12 130/68  06/05/12 128/82     Admit date:  (Not on file) Last encounter with RMR:  07/10/2012   Allergy Review of patient's allergies indicates no known allergies.  Current Outpatient Prescriptions  Medication Sig Dispense Refill  . albuterol (PROVENTIL HFA;VENTOLIN HFA) 108 (90 BASE) MCG/ACT inhaler Inhale 2 puffs into the lungs every 4 (four) hours as needed for wheezing or shortness of breath.      Marland Kitchen albuterol (PROVENTIL) (2.5 MG/3ML) 0.083% nebulizer solution Take 3 mLs (2.5 mg total) by nebulization 3 (three) times daily.  75 mL  1  . brimonidine-timolol (COMBIGAN) 0.2-0.5 % ophthalmic solution Place 1 drop into both eyes every 12 (twelve) hours.        . budesonide-formoterol (SYMBICORT) 160-4.5 MCG/ACT inhaler Inhale 2 puffs into the lungs 2 (two) times daily.      Marland Kitchen diltiazem (CARDIZEM CD) 120 MG 24 hr capsule Take 1 capsule (120 mg total) by mouth daily.  30 capsule  5  . furosemide (LASIX) 40 MG tablet Take 1 tablet (40 mg total) by mouth daily.  60 tablet  3  . montelukast (SINGULAIR) 10 MG tablet Take 10 mg by mouth at bedtime.      Marland Kitchen oxyCODONE (OXY IR/ROXICODONE) 5 MG immediate release tablet Take 1 tablet (5 mg total) by mouth every 6 (six) hours as needed.  30 tablet  0  . potassium chloride (K-DUR) 10 MEQ tablet Take 10 mEq by mouth daily.      . temazepam (RESTORIL) 15 MG capsule Take 1 capsule (15 mg total) by mouth at bedtime as needed for sleep.  30 capsule  5  .  tiotropium (SPIRIVA) 18 MCG inhalation capsule Place 18 mcg into inhaler and inhale daily.      . traMADol (ULTRAM) 50 MG tablet Take 1 tablet (50 mg total) by mouth 2 (two) times daily as needed for pain.  30 tablet  0  . travoprost, benzalkonium, (TRAVATAN Z) 0.004 % ophthalmic solution Place 1 drop into both eyes at bedtime.       Marland Kitchen warfarin (COUMADIN) 4 MG tablet Take 4-8 mg by mouth daily. Take 2 tablets (8 mg) on Tuesdays and Fridays, then take 1 tablet (4 mg) on Sundays, Mondays,Wednesdays,Thursdays,Saturdays       No current facility-administered medications for this visit.    Discontinued Meds:   There are no discontinued medications.  Patient Active Problem List   Diagnosis Date Noted  . HCAP (healthcare-associated pneumonia) 05/15/2012  . Hypokalemia 05/15/2012  . Acute respiratory failure 05/15/2012  . Atrial fibrillation with rapid ventricular response 05/15/2012  . Insomnia 04/21/2012  . 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 Bradycardia 03/18/2012  . Acute bronchitis 03/16/2012  . Glaucoma 03/15/2012  . Hypoxia 03/15/2012  . COPD (chronic obstructive pulmonary disease) 10/22/2011  . Bronchitis 05/11/2011  .  Elevated PSA 05/11/2011  . Tobacco abuse disorder 12/24/2010  . Shingles 09/14/2010  . Long term current use of anticoagulant 06/08/2010  . Encounter for long-term (current) use of other medications 05/15/2010  . Special screening for malignant neoplasms, colon 05/15/2010  . Need for prophylactic vaccination and inoculation against influenza 05/15/2010  . INGUINAL HERNIA, LEFT 01/24/2010  . GOUT 11/26/2009  . TRIGGER FINGER 10/01/2008  . LOW BACK PAIN, ACUTE 05/08/2008  . ERECTILE DYSFUNCTION 05/21/2006  . CHRONIC OBSTRUCTIVE PULMONARY DISEASE, ACUTE EXACERBATION 05/11/2006  . EDEMA LEG 05/11/2006  . Acute exacerbation of CHF (congestive heart failure) 05/03/2006  . ANEMIA-NOS 02/13/2006  . HYPERTENSION 02/13/2006   . Atrial fibrillation 02/13/2006  . ALLERGIC RHINITIS 02/13/2006    LABS    Component Value Date/Time   NA 136 05/20/2012 0533   NA 134* 05/19/2012 0608   NA 134* 05/18/2012 0605   K 5.1 05/20/2012 0533   K 4.7 05/19/2012 0608   K 5.4* 05/18/2012 0605   CL 100 05/20/2012 0533   CL 99 05/19/2012 0608   CL 99 05/18/2012 0605   CO2 30 05/20/2012 0533   CO2 28 05/19/2012 0608   CO2 28 05/18/2012 0605   GLUCOSE 93 05/20/2012 0533   GLUCOSE 94 05/19/2012 0608   GLUCOSE 93 05/18/2012 0605   BUN 14 05/20/2012 0533   BUN 18 05/19/2012 0608   BUN 22 05/18/2012 0605   CREATININE 0.99 05/20/2012 0533   CREATININE 0.92 05/19/2012 0608   CREATININE 1.01 05/18/2012 0605   CREATININE 1.03 04/11/2012 1405   CALCIUM 9.5 05/20/2012 0533   CALCIUM 9.3 05/19/2012 0608   CALCIUM 9.6 05/18/2012 0605   GFRNONAA 80* 05/20/2012 0533   GFRNONAA 82* 05/19/2012 0608   GFRNONAA 72* 05/18/2012 0605   GFRAA >90 05/20/2012 0533   GFRAA >90 05/19/2012 0608   GFRAA 84* 05/18/2012 0605   CMP     Component Value Date/Time   NA 136 05/20/2012 0533   K 5.1 05/20/2012 0533   CL 100 05/20/2012 0533   CO2 30 05/20/2012 0533   GLUCOSE 93 05/20/2012 0533   BUN 14 05/20/2012 0533   CREATININE 0.99 05/20/2012 0533   CREATININE 1.03 04/11/2012 1405   CALCIUM 9.5 05/20/2012 0533   PROT 7.3 05/18/2012 0605   ALBUMIN 2.8* 05/18/2012 0605   AST 16 05/18/2012 0605   ALT 13 05/18/2012 0605   ALKPHOS 75 05/18/2012 0605   BILITOT 0.4 05/18/2012 0605   GFRNONAA 80* 05/20/2012 0533   GFRAA >90 05/20/2012 0533       Component Value Date/Time   WBC 10.7* 05/18/2012 0605   WBC 13.4* 05/15/2012 0423   WBC 12.1* 05/15/2012 0020   HGB 10.2* 05/18/2012 0605   HGB 9.9* 05/15/2012 0423   HGB 11.5* 05/15/2012 0020   HCT 31.7* 05/18/2012 0605   HCT 31.4* 05/15/2012 0423   HCT 36.0* 05/15/2012 0020   MCV 90.6 05/18/2012 0605   MCV 90.8 05/15/2012 0423   MCV 90.5 05/15/2012 0020    Lipid Panel     Component Value Date/Time   CHOL  Value: 123        ATP III CLASSIFICATION:  <200     mg/dL   Desirable   191-478  mg/dL   Borderline High  >=295    mg/dL   High        62/03/3084 0620   TRIG 68 12/19/2009 0620   HDL 38* 12/19/2009 0620   CHOLHDL 3.2 12/19/2009 0620   VLDL 14 12/19/2009  1610   LDLCALC  Value: 71        Total Cholesterol/HDL:CHD Risk Coronary Heart Disease Risk Table                     Men   Women  1/2 Average Risk   3.4   3.3  Average Risk       5.0   4.4  2 X Average Risk   9.6   7.1  3 X Average Risk  23.4   11.0        Use the calculated Patient Ratio above and the CHD Risk Table to determine the patient's CHD Risk.        ATP III CLASSIFICATION (LDL):  <100     mg/dL   Optimal  960-454  mg/dL   Near or Above                    Optimal  130-159  mg/dL   Borderline  098-119  mg/dL   High  >147     mg/dL   Very High 82/11/5619 3086    ABG    Component Value Date/Time   PHART 7.415 05/15/2012 0145   PCO2ART 46.9* 05/15/2012 0145   PO2ART 104.0* 05/15/2012 0145   HCO3 29.4* 05/15/2012 0145   TCO2 26.8 05/15/2012 0145   O2SAT 98.9 05/15/2012 0145     Lab Results  Component Value Date   TSH 1.179 03/18/2012   BNP (last 3 results)  Recent Labs  03/29/12 1241 05/15/12 0020 05/16/12 0445  PROBNP 3788.0* 4353.0* 2044.0*   Cardiac Panel (last 3 results) No results found for this basename: CKTOTAL, CKMB, TROPONINI, RELINDX,  in the last 72 hours  Iron/TIBC/Ferritin No results found for this basename: iron, tibc, ferritin     EKG Orders placed during the hospital encounter of 05/15/12  . EKG 12-LEAD  . EKG 12-LEAD  . EKG     Prior Assessment and Plan Problem List as of 08/09/2012   Acute exacerbation of CHF (congestive heart failure)   Last Assessment & Plan   07/10/2012 Office Visit Written 07/10/2012  1:30 PM by Jodelle Gross, NP     No evidence of fluid overload at present but has elevated diastolic BP. He has been out of antihypertensives for over a week. We will change his Rx to 90 day supply with enough doses to last the duration.    Tobacco abuse disorder   Last  Assessment & Plan   07/10/2012 Office Visit Written 07/10/2012  1:33 PM by Jodelle Gross, NP     Needs to stop buying cigarettes and by his medication instead.    Glaucoma   GOUT   ANEMIA-NOS   ERECTILE DYSFUNCTION   HYPERTENSION   Last Assessment & Plan   07/10/2012 Office Visit Written 07/10/2012  1:32 PM by Jodelle Gross, NP     Blood pressure is not well controlled with noncompliance of medications as he has run out. I have explained the importance of compliance and need to take his medications. He verbalizes understanding. Will see him again in one month.    Atrial fibrillation   Last Assessment & Plan   05/10/2012 Office Visit Written 05/10/2012  1:28 PM by Jodelle Gross, NP     Heart rate is very well controlled today. He remains medically compliant. I am encouraged that he is has taken interest in his own health maintanence. INR today is 2.1.  ALLERGIC RHINITIS   CHRONIC OBSTRUCTIVE PULMONARY DISEASE, ACUTE EXACERBATION   Last Assessment & Plan   06/10/2012 Office Visit Written 06/10/2012  1:11 PM by Jodelle Gross, NP     He continues to have a chronic cough. He states it is a little worse today. I have advised him to use his inhalers as directed including the when necessary dosing. During the pollen allergy season, he may need to use it more often. He is to follow with Dr. Juanetta Gosling as directed.    INGUINAL HERNIA, LEFT   LOW BACK PAIN, ACUTE   TRIGGER FINGER   EDEMA LEG   Last Assessment & Plan   06/05/2012 Office Visit Written 06/10/2012 12:52 PM by Kerri Perches, MD     Leg elevation and will write for compression hose for right leg only    Encounter for long-term (current) use of other medications   Special screening for malignant neoplasms, colon   Need for prophylactic vaccination and inoculation against influenza   Long term current use of anticoagulant   Shingles   Last Assessment & Plan   08/29/2010 Office Visit Written 09/14/2010  3:15 PM by  Syliva Overman, MD     Diagnosed on the day of the visit in the Ed, he is to start medication, and advised not to scratch the rash    Bronchitis   Last Assessment & Plan   05/11/2011 Office Visit Written 05/14/2011  7:20 AM by Kerri Perches, MD     Acute symptoms, antibiotic prescribed    Elevated PSA   Last Assessment & Plan   06/05/2012 Office Visit Written 06/10/2012 12:53 PM by Kerri Perches, MD     Still needs to be seen by urology    COPD (chronic obstructive pulmonary disease)   Last Assessment & Plan   04/02/2012 Office Visit Written 04/21/2012  1:29 PM by Kerri Perches, MD     Currently stable, pt back to baseline following recent hospitalization    Hypoxia   Acute bronchitis   Last Assessment & Plan   04/02/2012 Office Visit Written 04/21/2012  1:29 PM by Kerri Perches, MD     Treated during recent hospitalization    Bradycardia   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 He is medically complaint. I have given him lots of encouragement to continue his healthy lifestyle. I will see him again in 6 weeks. He should continue daily wts and avoid salt. He verbalizes understanding.    Insomnia   Last Assessment & Plan   06/05/2012 Office Visit Written 06/10/2012 12:53 PM by Kerri Perches, MD     Sleep hygiene reviewed. Med prescribed    HCAP (healthcare-associated pneumonia)   Last Assessment & Plan   06/05/2012 Office Visit Written 06/10/2012 12:52 PM by Kerri Perches, MD     Repeat cxr shows resolution    Hypokalemia   Acute respiratory failure   Atrial fibrillation with rapid ventricular response   Last Assessment & Plan   06/10/2012 Office Visit Written 06/10/2012  1:12 PM by Jodelle Gross, NP     Heart rate remains irregular. Rate is controlled. He is  due to have PT/INR checked today.        Imaging: No results found.

## 2012-08-09 NOTE — Patient Instructions (Addendum)
Your physician recommends that you schedule a follow-up appointment in: 6 MONTHS WITH [facility] RE-SCHEDULE APPOINTMENT WITH LISA REID ASAP

## 2012-08-09 NOTE — Assessment & Plan Note (Signed)
Well compensated. No change in medication regimen.

## 2012-08-12 ENCOUNTER — Ambulatory Visit (INDEPENDENT_AMBULATORY_CARE_PROVIDER_SITE_OTHER): Payer: Medicare Other | Admitting: *Deleted

## 2012-08-12 DIAGNOSIS — I4891 Unspecified atrial fibrillation: Secondary | ICD-10-CM

## 2012-08-12 DIAGNOSIS — Z7901 Long term (current) use of anticoagulants: Secondary | ICD-10-CM

## 2012-08-13 ENCOUNTER — Other Ambulatory Visit: Payer: Self-pay

## 2012-08-13 ENCOUNTER — Telehealth: Payer: Self-pay | Admitting: Family Medicine

## 2012-08-13 DIAGNOSIS — G4700 Insomnia, unspecified: Secondary | ICD-10-CM

## 2012-08-13 MED ORDER — TRAMADOL HCL 50 MG PO TABS: 50.0000 mg | ORAL_TABLET | Freq: Two times a day (BID) | ORAL | Status: DC | PRN

## 2012-08-13 MED ORDER — TEMAZEPAM 15 MG PO CAPS: 15.0000 mg | ORAL_CAPSULE | Freq: Every evening | ORAL | Status: DC | PRN

## 2012-08-13 NOTE — Telephone Encounter (Signed)
meds refilled to [facility] °

## 2012-09-02 ENCOUNTER — Ambulatory Visit (INDEPENDENT_AMBULATORY_CARE_PROVIDER_SITE_OTHER): Payer: Medicare Other | Admitting: *Deleted

## 2012-09-02 DIAGNOSIS — I4891 Unspecified atrial fibrillation: Secondary | ICD-10-CM

## 2012-09-02 DIAGNOSIS — Z7901 Long term (current) use of anticoagulants: Secondary | ICD-10-CM

## 2012-09-02 LAB — POCT INR: INR: 4.3

## 2012-09-12 ENCOUNTER — Other Ambulatory Visit: Payer: Self-pay | Admitting: Family Medicine

## 2012-09-14 ENCOUNTER — Other Ambulatory Visit: Payer: Self-pay | Admitting: Family Medicine

## 2012-09-16 ENCOUNTER — Telehealth: Payer: Self-pay | Admitting: Cardiology

## 2012-09-16 ENCOUNTER — Ambulatory Visit (INDEPENDENT_AMBULATORY_CARE_PROVIDER_SITE_OTHER): Payer: Medicare Other | Admitting: *Deleted

## 2012-09-16 ENCOUNTER — Other Ambulatory Visit: Payer: Self-pay | Admitting: Adult Health

## 2012-09-16 DIAGNOSIS — Z7901 Long term (current) use of anticoagulants: Secondary | ICD-10-CM

## 2012-09-16 DIAGNOSIS — I4891 Unspecified atrial fibrillation: Secondary | ICD-10-CM

## 2012-09-16 LAB — POCT INR: INR: >8

## 2012-09-16 NOTE — Telephone Encounter (Signed)
Called by lab for critical value of INR of 7.4. Called pt to relay and instruct but pt reports he already spoke with anticoagulation nurse and plan in place. No bleeding. Has followup already. Understands if starts bleeding, needs to go to ER. No change in previous plan arranged with provider.

## 2012-09-19 ENCOUNTER — Ambulatory Visit (INDEPENDENT_AMBULATORY_CARE_PROVIDER_SITE_OTHER): Payer: Medicare Other | Admitting: *Deleted

## 2012-09-19 DIAGNOSIS — I4891 Unspecified atrial fibrillation: Secondary | ICD-10-CM

## 2012-09-19 DIAGNOSIS — Z7901 Long term (current) use of anticoagulants: Secondary | ICD-10-CM

## 2012-09-19 LAB — POCT INR: INR: 4.2

## 2012-09-24 ENCOUNTER — Telehealth: Payer: Self-pay | Admitting: *Deleted

## 2012-09-24 NOTE — Telephone Encounter (Signed)
Pt has a cough and weight is up, she would like to go up on his lasix tonight.

## 2012-09-24 NOTE — Telephone Encounter (Signed)
Called pt to confirm current signs and symptoms, pt notes current weight as 146lbs, noted last recorded weight of 148lb on 07-10-12 OV, pt states his weight was up from eating and drinking more and up til 150 last week but now at 146lbs, pt notes his cough is only every once in a while and more than likely due to sleeping under the fan, BP was good today as 130/79 per home health nurse, she advised for him to take extra fluid pill today per slight swelling in his legs, was given verbal instructions for pt to take the fluid pill as advised per home health nurse advice, pt understood

## 2012-09-26 ENCOUNTER — Ambulatory Visit (INDEPENDENT_AMBULATORY_CARE_PROVIDER_SITE_OTHER): Payer: Medicare Other | Admitting: *Deleted

## 2012-09-26 DIAGNOSIS — Z7901 Long term (current) use of anticoagulants: Secondary | ICD-10-CM

## 2012-09-26 DIAGNOSIS — I4891 Unspecified atrial fibrillation: Secondary | ICD-10-CM

## 2012-09-26 LAB — POCT INR: INR: 2.5

## 2012-10-07 ENCOUNTER — Encounter: Payer: Self-pay | Admitting: Family Medicine

## 2012-10-07 ENCOUNTER — Ambulatory Visit (INDEPENDENT_AMBULATORY_CARE_PROVIDER_SITE_OTHER): Payer: Medicare Other | Admitting: Family Medicine

## 2012-10-07 VITALS — BP 158/70 | HR 68 | Resp 18 | Ht 67.5 in | Wt 143.1 lb

## 2012-10-07 DIAGNOSIS — I10 Essential (primary) hypertension: Secondary | ICD-10-CM

## 2012-10-07 DIAGNOSIS — Z1212 Encounter for screening for malignant neoplasm of rectum: Secondary | ICD-10-CM

## 2012-10-07 DIAGNOSIS — R972 Elevated prostate specific antigen [PSA]: Secondary | ICD-10-CM

## 2012-10-07 DIAGNOSIS — G4700 Insomnia, unspecified: Secondary | ICD-10-CM

## 2012-10-07 DIAGNOSIS — R634 Abnormal weight loss: Secondary | ICD-10-CM

## 2012-10-07 DIAGNOSIS — Z1211 Encounter for screening for malignant neoplasm of colon: Secondary | ICD-10-CM

## 2012-10-07 DIAGNOSIS — I509 Heart failure, unspecified: Secondary | ICD-10-CM

## 2012-10-07 DIAGNOSIS — J449 Chronic obstructive pulmonary disease, unspecified: Secondary | ICD-10-CM

## 2012-10-07 DIAGNOSIS — I5030 Unspecified diastolic (congestive) heart failure: Secondary | ICD-10-CM

## 2012-10-07 LAB — POC HEMOCCULT BLD/STL (OFFICE/1-CARD/DIAGNOSTIC): Fecal Occult Blood, POC: NEGATIVE

## 2012-10-07 MED ORDER — TEMAZEPAM 15 MG PO CAPS: 15.0000 mg | ORAL_CAPSULE | Freq: Every evening | ORAL | Status: DC | PRN

## 2012-10-07 MED ORDER — BUDESONIDE-FORMOTEROL FUMARATE 160-4.5 MCG/ACT IN AERO: 2.0000 | INHALATION_SPRAY | Freq: Two times a day (BID) | RESPIRATORY_TRACT | Status: DC

## 2012-10-07 MED ORDER — FUROSEMIDE 40 MG PO TABS: 40.0000 mg | ORAL_TABLET | Freq: Every day | ORAL | Status: DC

## 2012-10-07 MED ORDER — ALBUTEROL SULFATE HFA 108 (90 BASE) MCG/ACT IN AERS: 2.0000 | INHALATION_SPRAY | RESPIRATORY_TRACT | Status: DC | PRN

## 2012-10-07 NOTE — Progress Notes (Signed)
  Subjective:    Patient ID: Theodore [name], male    DOB: Oct 20, 1940, 72 y.o.   MRN: [id number]  HPI The PT is here for follow up and re-evaluation of chronic medical conditions, medication management and review of any available recent lab and radiology data.  Preventive health is updated, specifically  Cancer screening and Immunization.  Needs colonoscopy and f/u of abn PSA, finances a constraint reportedly , but will need to follow through on this  The PT denies any adverse reactions to current medications since the last visit.  Breathing has improved with correct use of inhalers as well as due to the fact that he has stopped smoking. Reports poor appetite , "not eating as he should" and has actually lost a significant amt of weight over the past several months, which is very concerning     Review of Systems See HPI Denies recent fever or chills. Denies sinus pressure, nasal congestion, ear pain or sore throat. Denies chest congestion, productive cough chronic shortness of breath an wheeze, but has improved Denies chest pains, palpitations and leg swelling Denies abdominal pain, nausea, vomiting,diarrhea or constipation.   Denies dysuria, frequency, hesitancy or incontinence.  Denies depression or  anxiety his  Insomnia responds well to treatment. Denies skin break down or rash.        Objective:   Physical Exam  Patient alert and oriented , short of breath with minimal activity. Temporal wasting with obvious weight loss  HEENT: No facial asymmetry, EOMI, no sinus tenderness,  oropharynx pink and moist.  Neck supple no adenopathy.  Chest: decreased air entry throughout, no crackles or wheezes heard  CVS: S1, S2 no murmurs, no S3.  ABD: Soft non tender. Bowel sounds normal.  Ext: No edema  MS: Adequate though reduced  ROM spine, shoulders, hips and knees.  Skin: Intact, no ulcerations or rash noted.  Psych: Good eye contact, normal affect. Memory intact not anxious  or depressed appearing.         Assessment & Plan:

## 2012-10-07 NOTE — Patient Instructions (Addendum)
F/u in 3 month.  Rectal exam today.  We will look into referral for colonoscopy, I am concerned about your poor appetite and weight loss  PSA  And chem 7 today  All medication which needs to be refilled, especially the breahting and sleep medication is refilled

## 2012-10-08 LAB — BASIC METABOLIC PANEL
BUN: 8 mg/dL (ref 6–23)
Chloride: 102 mEq/L (ref 96–112)
Glucose, Bld: 92 mg/dL (ref 70–99)
Potassium: 4.2 mEq/L (ref 3.5–5.3)
Sodium: 140 mEq/L (ref 135–145)

## 2012-10-13 DIAGNOSIS — R634 Abnormal weight loss: Secondary | ICD-10-CM | POA: Insufficient documentation

## 2012-10-13 NOTE — Assessment & Plan Note (Signed)
Improved control with appropriate use of medication

## 2012-10-13 NOTE — Assessment & Plan Note (Signed)
Currently stable, followed closely by cardiology

## 2012-10-13 NOTE — Assessment & Plan Note (Signed)
Controlled, no change in medication  

## 2012-10-13 NOTE — Assessment & Plan Note (Signed)
Needs urology f/u, value has increased,case worker will be alerted of th situation, and referral is also entered. I will send a message to the urologist with new even more elevated PSA in background of poor appetite and weight loss

## 2012-10-13 NOTE — Assessment & Plan Note (Signed)
Sleep hygiene reviewed, continue current medication as it is effective

## 2012-10-17 ENCOUNTER — Ambulatory Visit (INDEPENDENT_AMBULATORY_CARE_PROVIDER_SITE_OTHER): Payer: Medicare Other | Admitting: *Deleted

## 2012-10-17 DIAGNOSIS — Z7901 Long term (current) use of anticoagulants: Secondary | ICD-10-CM

## 2012-10-17 DIAGNOSIS — I4891 Unspecified atrial fibrillation: Secondary | ICD-10-CM

## 2012-10-23 ENCOUNTER — Telehealth: Payer: Self-pay | Admitting: Family Medicine

## 2012-10-25 NOTE — Telephone Encounter (Signed)
Patient is aware 

## 2012-10-28 ENCOUNTER — Encounter: Payer: Self-pay | Admitting: Gastroenterology

## 2012-10-28 ENCOUNTER — Ambulatory Visit (INDEPENDENT_AMBULATORY_CARE_PROVIDER_SITE_OTHER): Payer: Medicare Other | Admitting: Gastroenterology

## 2012-10-28 ENCOUNTER — Telehealth: Payer: Self-pay

## 2012-10-28 VITALS — BP 138/78 | HR 62 | Temp 97.4°F | Ht 66.0 in | Wt 145.4 lb

## 2012-10-28 DIAGNOSIS — R634 Abnormal weight loss: Secondary | ICD-10-CM

## 2012-10-28 DIAGNOSIS — D649 Anemia, unspecified: Secondary | ICD-10-CM

## 2012-10-28 NOTE — Telephone Encounter (Signed)
Please advise 

## 2012-10-28 NOTE — Progress Notes (Signed)
CC'd to PCP 

## 2012-10-28 NOTE — Telephone Encounter (Signed)
Theodore [name], Theodore [name] was seen in our office today by Gerrit Halls, NP for wt. Loss and anemia. We would like to schedule for Colonoscopy and possibly EGd with Dr. Darrick Penna.  Would like to know if he may hold coumadin x 4 days prior to procedure. Please advise!

## 2012-10-28 NOTE — Assessment & Plan Note (Signed)
Unknown iron, ferritin. Heme negative recently through PCP. Likely multifactorial. Proceed with TCS+/- EGD as planned. Iron and ferritin for baseline.

## 2012-10-28 NOTE — Assessment & Plan Note (Addendum)
72 year old male presents today with unintentional weight loss and need for initial screening colonoscopy. He has lost almost 20 lbs in the past year; he reports decreased appetite and decreased oral intake. No overt signs of GI bleeding; I noted he has a normocytic anemia, and he is heme negative through Dr. Anthony Sar office.   Needs colonoscopy to evaluate for occult malignancy; he has reported the possibility of colon cancer in his brother. Somewhat of a poor historian. I have added a possible EGD at time of TCS due to decreased appetite and weight loss. We will obtain iron and ferritin as well to assess for a component of IDA. If evidence of IDA, will definitely need EGD as well at time of TCS.  Proceed with colonoscopy+/- EGDwith Dr. Darrick Penna in the near future. The risks, benefits, and alternatives have been discussed in detail with the patient. They state understanding and desire to proceed.  HOLD COUMADIN X 4 days prior. We will discuss with cardiology to ensure this is appropriate.  Consider CT abd/pelvis if negative TCS/EGD Obtain iron and ferritin now

## 2012-10-28 NOTE — Progress Notes (Signed)
Referring Provider: Kerri Perches, MD Primary Care Physician:  Syliva Overman, MD Primary Gastroenterologist:  Dr.  Chief Complaint  Patient presents with  . Colonoscopy  . Weight Loss    HPI:   Theodore [name] is a 72 year old male who presents today at the request of Dr. Lodema Hong secondary to weight loss and need for colonoscopy. States will go all day sometimes without eating. Likes string beans, mashed potatoes, cabbage, salads, squash, hamburgers. Decreased appetite. Weight 162 in August 2013. Now 145. No abdominal pain. No nausea, vomiting. No GERD. No dysphagia. No melena, hematochezia. No prior EGD. No significant changes in bowel habits.   Past Medical History  Diagnosis Date  . Anemia     Hemoglobin 11.6 in 04/2008->resolved   . Edema     Lower Extremity  . Tobacco abuse   . Glaucoma   . Allergic rhinitis   . Atrial fibrillation     Coumadin;asymptomatic bradycardia on telemetry in 12/2009  . CHF (congestive heart failure)     H/o CHF with preserved LV EF; pulmonary hypertension; normal EF in 03/2009  . Hypertension   . Shingles   . Shortness of breath   . COPD (chronic obstructive pulmonary disease)   . Asthma     Past Surgical History  Procedure Laterality Date  . Bilateral cataract surgery    . Herniorrhapy    . Tendon repair      right hand surgical procedure for a tendon repair    Current Outpatient Prescriptions  Medication Sig Dispense Refill  . albuterol (PROVENTIL HFA;VENTOLIN HFA) 108 (90 BASE) MCG/ACT inhaler Inhale 2 puffs into the lungs every 4 (four) hours as needed for wheezing or shortness of breath.  8.5 g  3  . albuterol (PROVENTIL) (2.5 MG/3ML) 0.083% nebulizer solution Take 3 mLs (2.5 mg total) by nebulization 3 (three) times daily.  75 mL  1  . brimonidine-timolol (COMBIGAN) 0.2-0.5 % ophthalmic solution Place 1 drop into both eyes every 12 (twelve) hours.        . budesonide-formoterol (SYMBICORT) 160-4.5 MCG/ACT inhaler Inhale 2 puffs  into the lungs 2 (two) times daily.  1 Inhaler  3  . diltiazem (CARDIZEM CD) 120 MG 24 hr capsule Take 1 capsule (120 mg total) by mouth daily.  30 capsule  5  . furosemide (LASIX) 40 MG tablet Take 1 tablet (40 mg total) by mouth daily.  60 tablet  3  . montelukast (SINGULAIR) 10 MG tablet Take 10 mg by mouth at bedtime.      . potassium chloride (K-DUR) 10 MEQ tablet Take 10 mEq by mouth daily.      Marland Kitchen SPIRIVA HANDIHALER 18 MCG inhalation capsule INHALE THE CONTENTS OF ONE CAPSULE VIA HANDIHALER ONCE DAILY  30 capsule  3  . temazepam (RESTORIL) 15 MG capsule Take 1 capsule (15 mg total) by mouth at bedtime as needed for sleep.  30 capsule  3  . traMADol (ULTRAM) 50 MG tablet Take 1 tablet (50 mg total) by mouth 2 (two) times daily as needed for pain.  60 tablet  3  . travoprost, benzalkonium, (TRAVATAN Z) 0.004 % ophthalmic solution Place 1 drop into both eyes at bedtime.       Marland Kitchen warfarin (COUMADIN) 4 MG tablet Take 4-8 mg by mouth daily. Take 2 tablets (8 mg) on Tuesdays and Fridays, then take 1 tablet (4 mg) on Sundays, Mondays,Wednesdays,Thursdays,Saturdays       No current facility-administered medications for this visit.    Allergies  as of 10/28/2012 - Review Complete 10/28/2012  Allergen Reaction Noted  . Aspirin  10/07/2012    Family History  Problem Relation Age of Onset  . Cancer Mother   . Cancer Father   . Coronary artery disease Brother   . Colon cancer Brother     and possibly father? Patient is unsure    History   Social History  . Marital Status: Married    Spouse Name: N/A    Number of Children: 2  . Years of Education: N/A   Occupational History  . retired from [employer] 13 yrs ago, was working part time in the school system up to 6 years ago   .     Social History Main Topics  . Smoking status: Former Smoker -- 0.50 packs/day for 55 years    Types: Cigarettes    Quit date: 02/13/2012  . Smokeless tobacco: Never Used     Comment:  occasionally smokes  . Alcohol Use: No  . Drug Use: No  . Sexual Activity: No   Other Topics Concern  . Not on file   Social History Narrative  . No narrative on file    Review of Systems: Gen: see HPI CV: Denies chest pain, heart palpitations, syncope, peripheral edema. Resp: DOE GI: see HPI GU : Denies urinary burning, urinary frequency, urinary incontinence.  MS: Denies joint pain, muscle weakness, cramps, limited movement Derm: Denies rash, itching, dry skin Psych: Denies depression, anxiety, confusion or memory loss  Heme: Denies bruising, bleeding, and enlarged lymph nodes.  Physical Exam: BP 138/78  Pulse 62  Temp(Src) 97.4 F (36.3 C) (Oral)  Ht 5\' 6"  (1.676 m)  Wt 145 lb 6.4 oz (65.953 kg)  BMI 23.48 kg/m2 General:   Alert and oriented. Well-developed, well-nourished, pleasant and cooperative. Head:  Normocephalic and atraumatic. Eyes:  Conjunctiva pink, sclera clear, no icterus.   Conjunctiva pink. Ears:  Normal auditory acuity. Nose:  No deformity, discharge,  or lesions. Mouth:  No deformity or lesions, mucosa pink and moist.  Neck:  Supple, without mass or thyromegaly. Lungs:  Clear to auscultation bilaterally, without wheezing, rales, or rhonchi.  Heart:  S1, S2 present, irregular Abdomen:  +BS, soft, non-tender and non-distended. Without mass or HSM. No rebound or guarding. No hernias noted. Rectal:  Deferred  Msk:  Symmetrical without gross deformities. Normal posture. Extremities:  Without clubbing or edema. Neurologic:  Alert and  oriented x4;  grossly normal neurologically. Skin:  Intact, warm and dry without significant lesions or rashes Cervical Nodes:  No significant cervical adenopathy. Psych:  Alert and cooperative. Normal mood and affect.  Lab Results  Component Value Date   WBC 10.7* 05/18/2012   HGB 10.2* 05/18/2012   HCT 31.7* 05/18/2012   MCV 90.6 05/18/2012   PLT 247 05/18/2012

## 2012-10-28 NOTE — Patient Instructions (Addendum)
Please have blood work done.   We will be checking with Theodore [name] about stopping the Coumadin before the procedure.   We have scheduled you for a colonoscopy with a possible upper endoscopy with Dr. Darrick Penna in the near future.

## 2012-10-29 ENCOUNTER — Other Ambulatory Visit: Payer: Self-pay | Admitting: Gastroenterology

## 2012-10-29 DIAGNOSIS — R634 Abnormal weight loss: Secondary | ICD-10-CM

## 2012-10-29 DIAGNOSIS — D649 Anemia, unspecified: Secondary | ICD-10-CM

## 2012-10-29 LAB — FERRITIN: Ferritin: 223 ng/mL (ref 22–322)

## 2012-10-29 LAB — IRON: Iron: 84 ug/dL (ref 42–165)

## 2012-10-29 MED ORDER — PEG-KCL-NACL-NASULF-NA ASC-C 100 G PO SOLR: 1.0000 | ORAL | Status: DC

## 2012-10-29 NOTE — Telephone Encounter (Signed)
I have patient scheduled with [facility] on Friday Sept 5th at 10:45 I am mailing him instructions an to inform him to hold coumadin for 4 days prior to procedure

## 2012-10-29 NOTE — Telephone Encounter (Signed)
Thanks. Noted.

## 2012-10-29 NOTE — Progress Notes (Signed)
Ok to hold Coumadin X 4 days per cardiology, resuming Coumadin the evening of the procedure at same dose. Follow-up with Vashti Hey thereafter.

## 2012-10-29 NOTE — Telephone Encounter (Signed)
Please call and make appt for pt to see me 5-7 days after procedure. Thanks Misty Stanley

## 2012-10-29 NOTE — Telephone Encounter (Signed)
Routing to AS and LAW.

## 2012-10-29 NOTE — Telephone Encounter (Signed)
I have made the pt an appt with Theodore [name] in the coumadin clinic for 11/21/2012 at 11:30 AM. I called pt and [name] with the info and also mailed him a letter with the appt.

## 2012-10-29 NOTE — Telephone Encounter (Signed)
Yes, ok to hold coumadin for 3-4 days prior to procedure, but start back same day after procedure is over at same dose. See Misty Stanley soon after for INR check.

## 2012-11-04 ENCOUNTER — Encounter (HOSPITAL_COMMUNITY): Payer: Self-pay | Admitting: Pharmacy Technician

## 2012-11-07 ENCOUNTER — Ambulatory Visit (INDEPENDENT_AMBULATORY_CARE_PROVIDER_SITE_OTHER): Payer: Medicare Other | Admitting: *Deleted

## 2012-11-07 DIAGNOSIS — Z7901 Long term (current) use of anticoagulants: Secondary | ICD-10-CM

## 2012-11-07 DIAGNOSIS — I4891 Unspecified atrial fibrillation: Secondary | ICD-10-CM

## 2012-11-07 LAB — POCT INR: INR: 1.3

## 2012-11-07 MED ORDER — WARFARIN SODIUM 4 MG PO TABS: 4.0000 mg | ORAL_TABLET | ORAL | Status: DC

## 2012-11-07 NOTE — Progress Notes (Signed)
Quick Note:  Iron and ferritin normal. Proceed with TCS/EGD as planned.  Anemia likely due to chronic disease. ______

## 2012-11-08 NOTE — Progress Notes (Signed)
Quick Note:  Called and informed pt. ______ 

## 2012-11-08 NOTE — Progress Notes (Signed)
Called and informed pt.  

## 2012-11-14 ENCOUNTER — Encounter: Payer: Self-pay | Admitting: *Deleted

## 2012-11-14 ENCOUNTER — Other Ambulatory Visit: Payer: Self-pay | Admitting: Gastroenterology

## 2012-11-14 ENCOUNTER — Telehealth: Payer: Self-pay

## 2012-11-14 DIAGNOSIS — R634 Abnormal weight loss: Secondary | ICD-10-CM

## 2012-11-14 DIAGNOSIS — D649 Anemia, unspecified: Secondary | ICD-10-CM

## 2012-11-14 NOTE — Telephone Encounter (Signed)
Alisha from [facility] called and she went by pt's house today and he has been taking his coumadin, he forgot to stop prior to procedure scheduled for tomorrow. She said that if we can reschedule him and call her, she will put him on her schedule to go by and remind him 4 days prior to procedure. He will also need new prescription, he had started his. Please advise!

## 2012-11-14 NOTE — Telephone Encounter (Signed)
[facility] FOR 9/19. HAVE PT PICKUP MOVIPREP SAMPLE.

## 2012-11-14 NOTE — Telephone Encounter (Signed)
Called and told Elease Hashimoto pt is rescheduled to 11/29/2012 at 1:00 and I will send new orders and call Vashti Hey for an appt to follow up following procedure. She is aware we are trying to get a free sample of the Movie Prep, and will let her know.

## 2012-11-15 ENCOUNTER — Encounter (HOSPITAL_COMMUNITY): Admission: RE | Payer: Self-pay | Source: Ambulatory Visit

## 2012-11-15 ENCOUNTER — Telehealth: Payer: Self-pay

## 2012-11-15 ENCOUNTER — Encounter (HOSPITAL_COMMUNITY): Payer: Self-pay | Admitting: Pharmacy Technician

## 2012-11-15 ENCOUNTER — Ambulatory Visit (HOSPITAL_COMMUNITY): Admission: RE | Admit: 2012-11-15 | Payer: Medicare Other | Source: Ambulatory Visit | Admitting: Gastroenterology

## 2012-11-15 SURGERY — COLONOSCOPY
Anesthesia: Moderate Sedation

## 2012-11-15 NOTE — Telephone Encounter (Signed)
INR check 9/29 at 2:00pm [employer], Misty Stanley

## 2012-11-15 NOTE — Telephone Encounter (Signed)
Misty Stanley, this pt forgot to stop his coumadin before 11/15/2012 when he was supposed to have his colonoscopy. Alisa from [facility] called to inform us. Pt is rescheduled for 11/29/2012. [name] is aware and said she will go by there 5 days prior to his procedure to remind him.  Please send me a time that you can recheck Pt/INR after procedure and I will let him know and mail him a reminder. Thanks!

## 2012-11-18 ENCOUNTER — Ambulatory Visit (INDEPENDENT_AMBULATORY_CARE_PROVIDER_SITE_OTHER): Payer: Medicare Other | Admitting: *Deleted

## 2012-11-18 DIAGNOSIS — I4891 Unspecified atrial fibrillation: Secondary | ICD-10-CM

## 2012-11-18 DIAGNOSIS — Z7901 Long term (current) use of anticoagulants: Secondary | ICD-10-CM

## 2012-11-18 NOTE — Telephone Encounter (Signed)
Thanks Misty Stanley! I am mailing a letter with this appt date/time to pt with his instructions for his procedure.

## 2012-11-20 NOTE — Telephone Encounter (Signed)
REVIEWED.  

## 2012-11-20 NOTE — Telephone Encounter (Signed)
Instructions have been mailed to pt along with appt to have INR checked after colonoscopy. That appt is with Vashti Hey on 12/09/2012 at 2:00 PM at [facility] office.

## 2012-11-25 MED ORDER — PEG-KCL-NACL-NASULF-NA ASC-C 100 G PO SOLR: 1.0000 | ORAL | Status: DC

## 2012-11-25 NOTE — Telephone Encounter (Signed)
REVIEWED.  

## 2012-11-25 NOTE — Telephone Encounter (Signed)
I called pt to let him know that we have not been able to get his free Movie Prep. I told him I am sending in new prescription to the pharmacy and he said that he will try to borrow the money for itSerina Cowper from [facility] had said his last one only cost a litte over $6.00). I did call Alisa to make sure that she was aware and she said that she will make sure he gets it if he needs help, they will help him.

## 2012-11-25 NOTE — Addendum Note (Signed)
Addended by: Lavena Bullion on: 11/25/2012 03:07 PM   Modules accepted: Orders

## 2012-11-28 ENCOUNTER — Telehealth: Payer: Self-pay | Admitting: Gastroenterology

## 2012-11-28 NOTE — Telephone Encounter (Signed)
REVIEWED.  

## 2012-11-28 NOTE — Telephone Encounter (Signed)
I called the pt's house and spoke to the social worker, [facility] He said [facility] could not get to pt's house until 12:30 and it is about 5 min to the hospital from his home. I called and informed Kim in Endo. She said she will let the others know. Pt is also aware that he needs to have family member or friend at the hospital to stay with him.

## 2012-11-28 NOTE — Telephone Encounter (Signed)
The social worker for patient called to let us know that patient is scheduled on 9/19 with [facility] for tcs/egd. Patient has [facility] bringing him but will not be able to get the patient to short stay at noon. Social worker said that transportation would be there at 1230 and patient will be there at hospital but will be coming in late. I told social worker that I would let nurse and SF know the situation.

## 2012-11-29 ENCOUNTER — Encounter (HOSPITAL_COMMUNITY)
Admission: RE | Disposition: A | Discharge status: Home / Self Care | Payer: Self-pay | Source: Ambulatory Visit | Attending: Gastroenterology

## 2012-11-29 ENCOUNTER — Ambulatory Visit (HOSPITAL_COMMUNITY)
Admission: RE | Admit: 2012-11-29 | Discharge: 2012-11-29 | Disposition: A | Discharge status: Home / Self Care | Payer: Medicare Other | Source: Ambulatory Visit | Attending: Gastroenterology | Admitting: Gastroenterology

## 2012-11-29 ENCOUNTER — Encounter (HOSPITAL_COMMUNITY): Payer: Self-pay | Admitting: *Deleted

## 2012-11-29 DIAGNOSIS — R634 Abnormal weight loss: Secondary | ICD-10-CM

## 2012-11-29 DIAGNOSIS — D649 Anemia, unspecified: Secondary | ICD-10-CM | POA: Insufficient documentation

## 2012-11-29 DIAGNOSIS — J4489 Other specified chronic obstructive pulmonary disease: Secondary | ICD-10-CM | POA: Insufficient documentation

## 2012-11-29 DIAGNOSIS — K449 Diaphragmatic hernia without obstruction or gangrene: Secondary | ICD-10-CM | POA: Insufficient documentation

## 2012-11-29 DIAGNOSIS — K2960 Other gastritis without bleeding: Secondary | ICD-10-CM

## 2012-11-29 DIAGNOSIS — J449 Chronic obstructive pulmonary disease, unspecified: Secondary | ICD-10-CM | POA: Insufficient documentation

## 2012-11-29 DIAGNOSIS — D126 Benign neoplasm of colon, unspecified: Secondary | ICD-10-CM | POA: Insufficient documentation

## 2012-11-29 DIAGNOSIS — K648 Other hemorrhoids: Secondary | ICD-10-CM | POA: Insufficient documentation

## 2012-11-29 DIAGNOSIS — K5730 Diverticulosis of large intestine without perforation or abscess without bleeding: Secondary | ICD-10-CM

## 2012-11-29 DIAGNOSIS — I10 Essential (primary) hypertension: Secondary | ICD-10-CM | POA: Insufficient documentation

## 2012-11-29 DIAGNOSIS — K2940 Chronic atrophic gastritis without bleeding: Secondary | ICD-10-CM | POA: Insufficient documentation

## 2012-11-29 HISTORY — PX: COLONOSCOPY: SHX5424

## 2012-11-29 HISTORY — PX: ESOPHAGOGASTRODUODENOSCOPY: SHX5428

## 2012-11-29 LAB — CBC WITH DIFFERENTIAL/PLATELET
Hemoglobin: 12.3 g/dL — ABNORMAL LOW (ref 13.0–17.0)
Lymphocytes Relative: 25 % (ref 12–46)
Lymphs Abs: 1.1 10*3/uL (ref 0.7–4.0)
Monocytes Relative: 18 % — ABNORMAL HIGH (ref 3–12)
Neutro Abs: 2.5 10*3/uL (ref 1.7–7.7)
Neutrophils Relative %: 55 % (ref 43–77)
RBC: 4.25 MIL/uL (ref 4.22–5.81)

## 2012-11-29 SURGERY — COLONOSCOPY
Anesthesia: Moderate Sedation

## 2012-11-29 MED ORDER — MIDAZOLAM HCL 5 MG/5ML IJ SOLN
INTRAMUSCULAR | Status: AC
  Filled 2012-11-29: qty 10

## 2012-11-29 MED ORDER — SODIUM CHLORIDE 0.9 % IV SOLN
INTRAVENOUS | Status: DC
  Administered 2012-11-29: 13:00:00 via INTRAVENOUS

## 2012-11-29 MED ORDER — MIDAZOLAM HCL 5 MG/5ML IJ SOLN
INTRAMUSCULAR | Status: DC | PRN
  Administered 2012-11-29 (×2): 1 mg via INTRAVENOUS
  Administered 2012-11-29: 2 mg via INTRAVENOUS

## 2012-11-29 MED ORDER — MEPERIDINE HCL 100 MG/ML IJ SOLN
INTRAMUSCULAR | Status: AC
  Filled 2012-11-29: qty 1

## 2012-11-29 MED ORDER — MEPERIDINE HCL 100 MG/ML IJ SOLN
INTRAMUSCULAR | Status: DC | PRN
  Administered 2012-11-29 (×2): 25 mg via INTRAVENOUS

## 2012-11-29 NOTE — Op Note (Signed)
[facility] [address] [place], [address]   COLONOSCOPY PROCEDURE REPORT  PATIENT: Theodore, [name]  MR#: [id number] BIRTHDATE: 1941-01-23 , 71  yrs. old GENDER: Male ENDOSCOPIST: Jonette Eva, MD REFERRED XB:JYNWGNFA Lodema Hong, M.D. PROCEDURE DATE:  11/29/2012 PROCEDURE:   Colonoscopy with snare polypectomy and Colonoscopy with cold biopsy polypectomy INDICATIONS:anemia, weight loss.  SEVERE DOE REPORTED BY FAMILY.Marland Kitchen MEDICATIONS: Demerol 50 mg IV and Versed 4 mg IV  DESCRIPTION OF PROCEDURE:    Physical exam was performed.  Informed consent was obtained from the patient after explaining the benefits, risks, and alternatives to procedure.  The patient was connected to monitor and placed in left lateral position. Continuous oxygen was provided by nasal cannula and IV medicine administered through an indwelling cannula.  After administration of sedation and rectal exam, the patients rectum was intubated and the EC-3890Li ([id number])  colonoscope was advanced under direct visualization to the ileum.  The scope was removed slowly by carefully examining the color, texture, anatomy, and integrity mucosa on the way out.  The patient was recovered in endoscopy and discharged home in satisfactory condition.    COLON FINDINGS: The mucosa appeared normal in the terminal ileum.  , Seven sessile polyps measuring 3-8 mm in size were found in the sigmoid colon and descending colon.  A polypectomy was performed with cold forceps and using snare cautery.  , Mild diverticulosis was noted in the sigmoid colon.  , and Moderate sized internal hemorrhoids were found.  PREP QUALITY: good.   CECAL W/D TIME: 26 minutes  COMPLICATIONS: None  ENDOSCOPIC IMPRESSION: 1.   Normal mucosa in the terminal ileum 2.   Seven sessile polyps REMOVED 3.   Mild diverticulosis was noted in the sigmoid colon 4.   Moderate sized internal hemorrhoids  RECOMMENDATIONS: NO SOURCE FOR  ANEMIA/WEIGHT LOSS IDETIFIED, PROCEED TO EGD. HIGH FIBER DIET AWAIT BIOPSY       _______________________________ Rosalie DoctorJonette Eva, MD 11/29/2012 4:50 PM     PATIENT NAME:  Theodore, [name] MR#: [id number]

## 2012-11-29 NOTE — Op Note (Signed)
[facility] [address] [place], [address]   ENDOSCOPY PROCEDURE REPORT  PATIENT: Theodore [name], Theodore [name]  MR#: [id number] BIRTHDATE: 08-30-1940 , 71  yrs. old GENDER: Male  ENDOSCOPIST: Jonette Eva, MD REFERRED XB:JYNWGNFA Lodema Hong, M.D.  PROCEDURE DATE: 11/29/2012 PROCEDURE:   EGD w/ biopsy  INDICATIONS:ANEMIA/WEIGHT LOSS. MEDICATIONS:     TCS+ NONE TOPICAL ANESTHETIC:   Cetacaine Spray  DESCRIPTION OF PROCEDURE:     Physical exam was performed.  Informed consent was obtained from the patient after explaining the benefits, risks, and alternatives to the procedure.  The patient was connected to the monitor and placed in the left lateral position.  Continuous oxygen was provided by nasal cannula and IV medicine administered through an indwelling cannula.  After administration of sedation, the patients esophagus was intubated and the EC-3890Li ([id number]) and EG-[id number] ([id number])  endoscope was advanced under direct visualization to the second portion of the duodenum.  The scope was removed slowly by carefully examining the color, texture, anatomy, and integrity of the mucosa on the way out.  The patient was recovered in endoscopy and discharged home in satisfactory condition.   ESOPHAGUS: The mucosa of the esophagus appeared normal.   A small hiatal hernia was noted.   STOMACH: Mild erosive gastritis (inflammation) was found in the gastric body.  Multiple biopsies were performed.   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   DUODENUM: The duodenal mucosa showed no abnormalities in the bulb and second portion of the duodenum. Cold forcep biopsies were taken in the second portion.  COMPLICATIONS:   None  ENDOSCOPIC IMPRESSION: 1.  ANEMIA MOST LIKELY DUE TO CHRONIC DISEASE. WEIGHT LOSS MOST LIKELY DUE TO PULMONARY CACHEXIA. 2.   Small hiatal hernia 3.   MODERATE Erosive gastritis 4.   MODERATE Atrophic  gastritis  RECOMMENDATIONS: CBC TODAY. FOLLOW A HIGH FIBER DIET.  AVOID ITEMS THAT CAUSE BLOATING. BIOPSY WILL BE BACK IN 7 DAYS. FOLLOW UP IN 3 MOS.   REPEAT EXAM:   _______________________________ Rosalie DoctorJonette Eva, MD 11/29/2012 4:55 PM       PATIENT NAME:  Theodore [name] MR#: [id number]

## 2012-11-29 NOTE — Progress Notes (Signed)
REVIEWED.  

## 2012-11-29 NOTE — H&P (Signed)
Primary Care Physician:  Syliva Overman, MD Primary Gastroenterologist:  Dr. Darrick Penna  Pre-Procedure History & Physical: HPI:  Theodore [name] is a 72 y.o. male here for Anorexia/weight loss/ANEMIA.  Past Medical History  Diagnosis Date  . Anemia     Hemoglobin 11.6 in 04/2008->resolved   . Edema     Lower Extremity  . Tobacco abuse   . Glaucoma   . Allergic rhinitis   . Atrial fibrillation     Coumadin;asymptomatic bradycardia on telemetry in 12/2009  . CHF (congestive heart failure)     H/o CHF with preserved LV EF; pulmonary hypertension; normal EF in 03/2009  . Hypertension   . Shingles   . Shortness of breath   . COPD (chronic obstructive pulmonary disease)   . Asthma     Past Surgical History  Procedure Laterality Date  . Bilateral cataract surgery    . Herniorrhapy    . Tendon repair      right hand surgical procedure for a tendon repair    Prior to Admission medications   Medication Sig Start Date End Date Taking? Authorizing Provider  albuterol (PROVENTIL HFA;VENTOLIN HFA) 108 (90 BASE) MCG/ACT inhaler Inhale 2 puffs into the lungs every 4 (four) hours as needed for wheezing or shortness of breath. 10/07/12  Yes Kerri Perches, MD  albuterol (PROVENTIL) (2.5 MG/3ML) 0.083% nebulizer solution Take 3 mLs (2.5 mg total) by nebulization 3 (three) times daily. 03/19/12  Yes Erick Blinks, MD  brimonidine-timolol (COMBIGAN) 0.2-0.5 % ophthalmic solution Place 1 drop into both eyes every 12 (twelve) hours.     Yes Historical Provider, MD  budesonide-formoterol (SYMBICORT) 160-4.5 MCG/ACT inhaler Inhale 2 puffs into the lungs 2 (two) times daily. 10/07/12  Yes Kerri Perches, MD  diltiazem (CARDIZEM CD) 120 MG 24 hr capsule Take 1 capsule (120 mg total) by mouth daily. 06/26/12  Yes Kathlen Brunswick, MD  furosemide (LASIX) 40 MG tablet Take 1 tablet (40 mg total) by mouth daily. 10/07/12  Yes Kerri Perches, MD  montelukast (SINGULAIR) 10 MG tablet Take 10 mg by  mouth at bedtime.   Yes Historical Provider, MD  peg 3350 powder (MOVIPREP) 100 G SOLR Take 1 kit (200 g total) by mouth as directed. 11/25/12  Yes West Bali, MD  temazepam (RESTORIL) 15 MG capsule Take 1 capsule (15 mg total) by mouth at bedtime as needed for sleep. 10/07/12  Yes Kerri Perches, MD  tiotropium (SPIRIVA HANDIHALER) 18 MCG inhalation capsule Place 18 mcg into inhaler and inhale daily.   Yes Historical Provider, MD  travoprost, benzalkonium, (TRAVATAN Z) 0.004 % ophthalmic solution Place 1 drop into both eyes at bedtime.    Yes Historical Provider, MD  potassium chloride (K-DUR) 10 MEQ tablet Take 10 mEq by mouth daily.    Historical Provider, MD  traMADol (ULTRAM) 50 MG tablet Take 1 tablet (50 mg total) by mouth 2 (two) times daily as needed for pain. 08/13/12 08/13/13  Kerri Perches, MD  warfarin (COUMADIN) 4 MG tablet Take 4 mg by mouth as directed. Takes 4 mg day 1, 4 mg day 2, then 8 mg on day 3. Then repeats the cycle. 11/07/12   Jonelle Sidle, MD    Allergies as of 11/14/2012 - Review Complete 11/04/2012  Allergen Reaction Noted  . Aspirin  10/07/2012    Family History  Problem Relation Age of Onset  . Cancer Mother   . Cancer Father   . Coronary artery disease Brother   .  Colon cancer Brother     and possibly father? Patient is unsure    History   Social History  . Marital Status: Married    Spouse Name: N/A    Number of Children: 2  . Years of Education: N/A   Occupational History  . retired from [employer] 13 yrs ago, was working part time in the school system up to 6 years ago   .     Social History Main Topics  . Smoking status: Former Smoker -- 0.50 packs/day for 55 years    Types: Cigarettes    Quit date: 02/13/2012  . Smokeless tobacco: Never Used     Comment: occasionally smokes  . Alcohol Use: No  . Drug Use: No  . Sexual Activity: No   Other Topics Concern  . Not on file   Social History Narrative  . No  narrative on file    Review of Systems: See HPI, otherwise negative ROS   Physical Exam: BP 171/103  Pulse 82  Temp(Src) 98.4 F (36.9 C) (Oral)  Resp 24  Ht 5\' 6"  (1.676 m)  Wt 145 lb (65.772 kg)  BMI 23.41 kg/m2  SpO2 95% General:   Alert,  pleasant and cooperative in NAD Head:  Normocephalic and atraumatic. Neck:  Supple; Lungs:  Clear throughout to auscultation.    Heart:  Regular rate and rhythm. Abdomen:  Soft, nontender and nondistended. Normal bowel sounds, without guarding, and without rebound.   Neurologic:  Alert and  oriented x4;  grossly normal neurologically.  Impression/Plan:     Anorexia/weight loss/ANEMIA  PLAN: 1. TCS/?EGD TODAY

## 2012-12-03 ENCOUNTER — Encounter (HOSPITAL_COMMUNITY): Payer: Self-pay | Admitting: Gastroenterology

## 2012-12-04 ENCOUNTER — Ambulatory Visit (INDEPENDENT_AMBULATORY_CARE_PROVIDER_SITE_OTHER): Payer: Medicare Other | Admitting: *Deleted

## 2012-12-04 DIAGNOSIS — I4891 Unspecified atrial fibrillation: Secondary | ICD-10-CM

## 2012-12-04 DIAGNOSIS — Z7901 Long term (current) use of anticoagulants: Secondary | ICD-10-CM

## 2012-12-15 ENCOUNTER — Other Ambulatory Visit: Payer: Self-pay | Admitting: Family Medicine

## 2012-12-18 ENCOUNTER — Telehealth: Payer: Self-pay | Admitting: Gastroenterology

## 2012-12-18 DIAGNOSIS — D649 Anemia, unspecified: Secondary | ICD-10-CM

## 2012-12-18 NOTE — Telephone Encounter (Signed)
Please call pt. 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 His stomach Bx shows gastritis.  HIS SMALL BOWEL BIOPSIES ARE NORMAL. HIS BLOOD COUNT IN SEP 2014 IS BETTER THAN IT WAS IN MAR.HE HAS LOW BLOOD COUNT MOST LIKELY BECAUSE HE HAS A POOR APPETITE DUE TO ADVANCED LUNG DISEASE.   FOLLOW A HIGH FIBER DIET. AVOID ITEMS THAT CAUSE BLOATING.  FOLLOW UP IN 3 MOS E30 ANEMIA, WEIGHT LOSS.  NEXT COLONOSCOPY IN 5 YEARS IF THE BENEFITS OUTWEIGH THE RISKS.

## 2012-12-19 NOTE — Telephone Encounter (Signed)
Pt aware of results 

## 2012-12-20 NOTE — Telephone Encounter (Signed)
Reminder in epic °

## 2012-12-25 ENCOUNTER — Ambulatory Visit (INDEPENDENT_AMBULATORY_CARE_PROVIDER_SITE_OTHER): Payer: Medicare Other | Admitting: *Deleted

## 2012-12-25 DIAGNOSIS — Z7901 Long term (current) use of anticoagulants: Secondary | ICD-10-CM

## 2012-12-25 DIAGNOSIS — I4891 Unspecified atrial fibrillation: Secondary | ICD-10-CM

## 2012-12-25 LAB — POCT INR
INR: 1.8
INR: 2.5

## 2012-12-27 ENCOUNTER — Other Ambulatory Visit: Payer: Self-pay

## 2012-12-27 ENCOUNTER — Telehealth: Payer: Self-pay | Admitting: Family Medicine

## 2012-12-27 DIAGNOSIS — G4700 Insomnia, unspecified: Secondary | ICD-10-CM

## 2012-12-27 MED ORDER — TEMAZEPAM 15 MG PO CAPS: 15.0000 mg | ORAL_CAPSULE | Freq: Every evening | ORAL | Status: DC | PRN

## 2012-12-27 NOTE — Telephone Encounter (Signed)
Med refilled and awaiting signature  

## 2013-01-08 ENCOUNTER — Ambulatory Visit (INDEPENDENT_AMBULATORY_CARE_PROVIDER_SITE_OTHER): Payer: Medicare Other | Admitting: Family Medicine

## 2013-01-08 ENCOUNTER — Encounter: Payer: Self-pay | Admitting: Family Medicine

## 2013-01-08 ENCOUNTER — Encounter (INDEPENDENT_AMBULATORY_CARE_PROVIDER_SITE_OTHER): Payer: Self-pay

## 2013-01-08 VITALS — BP 148/62 | HR 60 | Resp 20 | Ht 67.5 in | Wt 146.1 lb

## 2013-01-08 DIAGNOSIS — Z139 Encounter for screening, unspecified: Secondary | ICD-10-CM

## 2013-01-08 DIAGNOSIS — R972 Elevated prostate specific antigen [PSA]: Secondary | ICD-10-CM

## 2013-01-08 DIAGNOSIS — D649 Anemia, unspecified: Secondary | ICD-10-CM

## 2013-01-08 DIAGNOSIS — Z79899 Other long term (current) drug therapy: Secondary | ICD-10-CM

## 2013-01-08 DIAGNOSIS — J449 Chronic obstructive pulmonary disease, unspecified: Secondary | ICD-10-CM

## 2013-01-08 DIAGNOSIS — I10 Essential (primary) hypertension: Secondary | ICD-10-CM

## 2013-01-08 DIAGNOSIS — Z720 Tobacco use: Secondary | ICD-10-CM

## 2013-01-08 DIAGNOSIS — Z23 Encounter for immunization: Secondary | ICD-10-CM

## 2013-01-08 DIAGNOSIS — G4700 Insomnia, unspecified: Secondary | ICD-10-CM

## 2013-01-08 DIAGNOSIS — F17200 Nicotine dependence, unspecified, uncomplicated: Secondary | ICD-10-CM

## 2013-01-08 NOTE — Patient Instructions (Signed)
Annual wellness in 4.5 month, call if you need me before  Flu vaccine today.  Please do not smoke anymore cigarettes after this pack, as we discussed, you need to quit  Need to see urologist about your prostate still.  CBC, fasting lipid, cmp and TSH in 4.5 month, just before visit , also vit D

## 2013-01-08 NOTE — Assessment & Plan Note (Signed)
Controlled, no change in medication Sleep hygiene reviewed also

## 2013-01-08 NOTE — Assessment & Plan Note (Signed)
Improved  control with medication use as directed Encouraged smoking cessation

## 2013-01-08 NOTE — Assessment & Plan Note (Signed)

## 2013-01-08 NOTE — Progress Notes (Signed)
  Subjective:    Patient ID: Theodore [name], male    DOB: [birth year], 72 y.o.   MRN: [id number]  HPI The PT is here for follow up and re-evaluation of chronic medical conditions, medication management and review of any available recent lab and radiology data.  Preventive health is updated, specifically  Cancer screening and Immunization.  Had colonoscopy since last visit, working on urology follow up. Questions or concerns regarding consultations or procedures which the PT has had in the interim are  addressed. The PT denies any adverse reactions to current medications since the last visit.  There are no new concerns. Doing better with med administration as well as restricting salt and water intake There are no specific complaints       Review of Systems See HPI Denies recent fever or chills. Denies sinus pressure, nasal congestion, ear pain or sore throat. Denies chest congestion, productive cough or wheezing.Chronic smokers cough, no flare of wheezing  Denies chest pains, palpitations and leg swelling. Denies PND or orthopnea Denies abdominal pain, nausea, vomiting,diarrhea or constipation.   Denies dysuria, frequency, hesitancy or incontinence. Denies joint pain, swelling and limitation in mobility. Denies headaches, seizures, numbness, or tingling. Denies depression, anxiety or insomnia. Denies skin break down or rash.        Objective:   Physical Exam  Patient alert and oriented and in no cardiopulmonary distress.  HEENT: No facial asymmetry, EOMI, no sinus tenderness,  oropharynx pink and moist.  Neck supple no adenopathy.  Chest: Clear to auscultation bilaterally.Decreased air entry throughout  CVS: S1, S2 no murmurs, no S3.  ABD: Soft non tender. Bowel sounds normal.  Ext: No edema  MS: Adequate ROM spine, shoulders, hips and knees.  Skin: Intact, no ulcerations or rash noted.  Psych: Good eye contact, normal affect. Memory intact not anxious or depressed  appearing.  CNS: CN 2-12 intact, power, tone and sensation normal throughout.       Assessment & Plan:

## 2013-01-08 NOTE — Assessment & Plan Note (Signed)
Urology appt is still outstanding

## 2013-01-08 NOTE — Assessment & Plan Note (Signed)
Controlled, no change in medication DASH diet and commitment to daily physical activity for a minimum of 30 minutes discussed and encouraged, as a part of hypertension management. The importance of attaining a healthy weight is also discussed.  

## 2013-01-10 ENCOUNTER — Other Ambulatory Visit: Payer: Self-pay

## 2013-01-10 MED ORDER — POTASSIUM CHLORIDE ER 10 MEQ PO TBCR: 10.0000 meq | EXTENDED_RELEASE_TABLET | Freq: Every day | ORAL | Status: DC

## 2013-01-13 ENCOUNTER — Other Ambulatory Visit: Payer: Self-pay | Admitting: Cardiology

## 2013-01-22 ENCOUNTER — Ambulatory Visit (INDEPENDENT_AMBULATORY_CARE_PROVIDER_SITE_OTHER): Payer: Medicare Other | Admitting: *Deleted

## 2013-01-22 DIAGNOSIS — I4891 Unspecified atrial fibrillation: Secondary | ICD-10-CM

## 2013-01-22 DIAGNOSIS — Z7901 Long term (current) use of anticoagulants: Secondary | ICD-10-CM

## 2013-02-05 ENCOUNTER — Encounter: Payer: Self-pay | Admitting: Gastroenterology

## 2013-02-12 ENCOUNTER — Ambulatory Visit: Payer: Medicare Other | Admitting: Adult Health

## 2013-02-12 ENCOUNTER — Ambulatory Visit (INDEPENDENT_AMBULATORY_CARE_PROVIDER_SITE_OTHER): Payer: Medicare Other | Admitting: Physician Assistant

## 2013-02-12 ENCOUNTER — Ambulatory Visit (INDEPENDENT_AMBULATORY_CARE_PROVIDER_SITE_OTHER): Payer: Medicare Other | Admitting: *Deleted

## 2013-02-12 ENCOUNTER — Encounter: Payer: Self-pay | Admitting: Physician Assistant

## 2013-02-12 VITALS — BP 118/70 | HR 73 | Ht 66.0 in | Wt 139.0 lb

## 2013-02-12 DIAGNOSIS — Z720 Tobacco use: Secondary | ICD-10-CM

## 2013-02-12 DIAGNOSIS — I4891 Unspecified atrial fibrillation: Secondary | ICD-10-CM

## 2013-02-12 DIAGNOSIS — Z7901 Long term (current) use of anticoagulants: Secondary | ICD-10-CM

## 2013-02-12 DIAGNOSIS — I509 Heart failure, unspecified: Secondary | ICD-10-CM

## 2013-02-12 DIAGNOSIS — I5030 Unspecified diastolic (congestive) heart failure: Secondary | ICD-10-CM

## 2013-02-12 DIAGNOSIS — I10 Essential (primary) hypertension: Secondary | ICD-10-CM

## 2013-02-12 DIAGNOSIS — F17200 Nicotine dependence, unspecified, uncomplicated: Secondary | ICD-10-CM

## 2013-02-12 LAB — POCT INR: INR: 1.7

## 2013-02-12 MED ORDER — POTASSIUM CHLORIDE ER 10 MEQ PO TBCR: 10.0000 meq | EXTENDED_RELEASE_TABLET | Freq: Every day | ORAL | Status: DC

## 2013-02-12 MED ORDER — FUROSEMIDE 40 MG PO TABS: 40.0000 mg | ORAL_TABLET | Freq: Every day | ORAL | Status: DC

## 2013-02-12 NOTE — Assessment & Plan Note (Signed)
Controlled rate and on Coumadin

## 2013-02-12 NOTE — Assessment & Plan Note (Signed)
Patient is well compensated

## 2013-02-12 NOTE — Progress Notes (Signed)
HPI:   This is a 72 year old male patient of Dr. Dietrich Pates who has a history of hypertension with diastolic heart failure with preserved LV function, atrial fibrillation,  He is also followed in our Coumadin clinic regularly. He comes in today for regular six-month followup. He says he gets out of breath if he tries to rush but if he takes his time he does fine.He denies any significant dyspnea on exertion, chest pain, edema, palpitations, dizziness or presyncope.   Allergies-- Aspirin    --  On coumadin  Current Outpatient Prescriptions on File Prior to Visit: albuterol (PROVENTIL HFA;VENTOLIN HFA) 108 (90 BASE) MCG/ACT inhaler, Inhale 2 puffs into the lungs every 4 (four) hours as needed for wheezing or shortness of breath., Disp: 8.5 g, Rfl: 3 albuterol (PROVENTIL) (2.5 MG/3ML) 0.083% nebulizer solution, INHALE 1 VIAL VIA NEBULIZER EVERY 6 HOURS AS NEEDED, Disp: 375 mL, Rfl: 1 brimonidine-timolol (COMBIGAN) 0.2-0.5 % ophthalmic solution, Place 1 drop into both eyes every 12 (twelve) hours.  , Disp: , Rfl:  budesonide-formoterol (SYMBICORT) 160-4.5 MCG/ACT inhaler, Inhale 2 puffs into the lungs 2 (two) times daily., Disp: 1 Inhaler, Rfl: 3 CARTIA XT 120 MG 24 hr capsule, TAKE 1 CAPSULE BY MOUTH EVERY DAY, Disp: 30 capsule, Rfl: 6 furosemide (LASIX) 40 MG tablet, Take 1 tablet (40 mg total) by mouth daily., Disp: 60 tablet, Rfl: 3 montelukast (SINGULAIR) 10 MG tablet, Take 10 mg by mouth at bedtime., Disp: , Rfl:  potassium chloride (K-DUR) 10 MEQ tablet, Take 1 tablet (10 mEq total) by mouth daily., Disp: 30 tablet, Rfl: 3 temazepam (RESTORIL) 15 MG capsule, Take 1 capsule (15 mg total) by mouth at bedtime as needed for sleep., Disp: 30 capsule, Rfl: 1 tiotropium (SPIRIVA) 18 MCG inhalation capsule, Place 18 mcg into inhaler and inhale daily., Disp: , Rfl:  traMADol (ULTRAM) 50 MG tablet, Take 1 tablet (50 mg total) by mouth 2 (two) times daily as needed for pain., Disp: 60 tablet, Rfl:  3 travoprost, benzalkonium, (TRAVATAN Z) 0.004 % ophthalmic solution, Place 1 drop into both eyes at bedtime. , Disp: , Rfl:  warfarin (COUMADIN) 4 MG tablet, TAKE 1 TABLET BY MOUTH AS DIRECTED BY COUMADIN CLINIC EVERY DAY, Disp: 45 tablet, Rfl: 3  No current facility-administered medications on file prior to visit.   Past Medical History:   Anemia                                                         Comment:Hemoglobin 11.6 in 04/2008->resolved    Edema                                                          Comment:Lower Extremity   Tobacco abuse                                                Glaucoma  Allergic rhinitis                                            Atrial fibrillation                                            Comment:Coumadin;asymptomatic bradycardia on telemetry               in 12/2009   CHF (congestive heart failure)                                 Comment:H/o CHF with preserved LV EF; pulmonary               hypertension; normal EF in 03/2009   Hypertension                                                 Shingles                                                     Shortness of breath                                          COPD (chronic obstructive pulmonary disease)                 Asthma                                                      Past Surgical History:   Bilateral cataract surgery                                    herniorrhapy                                                  TENDON REPAIR                                                   Comment:right hand surgical procedure for a tendon               repair   COLONOSCOPY  N/A 11/29/2012      Comment:Procedure: COLONOSCOPY;  Surgeon: West Bali, MD;  Location: [facility] SUITE;  Service:              Endoscopy;  Laterality: N/A;  1:00   ESOPHAGOGASTRODUODENOSCOPY                      N/A  11/29/2012      Comment:Procedure: ESOPHAGOGASTRODUODENOSCOPY (EGD);                Surgeon: West Bali, MD;  Location: [facility] END[facility]              SUITE;  Service: Endoscopy;  Laterality: N/A;  Review of patient's family history indicates:   Cancer                         Mother                   Cancer                         Father                   Coronary artery disease        Brother                  Colon cancer                   Brother                    Comment: and possibly father? Patient is unsure   Social History   Marital Status: Married             Spouse Name:                      Years of Education:                 Number of children: 2           Occupational History Occupation          Associate Professor            Comment              retired from [employer]*                        Social History Main Topics   Smoking Status: Former Smoker                   Packs/Day: 0.50  Years: 55        Types: Cigarettes     Quit date: 02/13/2012   Smokeless Status: Never Used                       Comment: occasionally smokes   Alcohol Use: No             Drug Use: No             Sexual Activity: No                 Other Topics            Concern   None on file  Social History  Narrative   None on file    ROS: See history of present illness otherwise negative   PHYSICAL EXAM: Well-nournished, in no acute distress. Neck: No JVD, HJR, Bruit, or thyroid enlargement  Lungs: Decreased breath sounds throughout with fine crackles throughout, No tachypnea,  without wheezing, rales, or rhonchi  Cardiovascular: Irregular, PMI not displaced, heart sounds normal, no murmurs, gallops, bruit, thrill, or heave.  Abdomen: BS normal. Soft without organomegaly, masses, lesions or tenderness.  Extremities: without cyanosis, clubbing or edema. Good distal pulses bilateral  SKin: Warm, no lesions or rashes   Musculoskeletal: No deformities  Neuro: no focal signs  BP 118/70  Pulse 73  Ht  5\' 6"  (1.676 m)  Wt 139 lb (63.05 kg)  BMI 22.45 kg/m2

## 2013-02-12 NOTE — Assessment & Plan Note (Signed)
Controlled.  

## 2013-02-12 NOTE — Patient Instructions (Addendum)
Your physician recommends that you schedule a follow-up appointment in: 6 months with Dr Purvis Sheffield  Your physician recommends that you continue on your current medications as directed. Please refer to the Current Medication list given to you today.  2 Gram Low Sodium Diet A 2 gram sodium diet restricts the amount of sodium in the diet to no more than 2 g or 2000 mg daily. Limiting the amount of sodium is often used to help lower blood pressure. It is important if you have heart, liver, or kidney problems. Many foods contain sodium for flavor and sometimes as a preservative. When the amount of sodium in a diet needs to be low, it is important to know what to look for when choosing foods and drinks. The following includes some information and guidelines to help make it easier for you to adapt to a low sodium diet. QUICK TIPS  Do not add salt to food.  Avoid convenience items and fast food.  Choose unsalted snack foods.  Buy lower sodium products, often labeled as "lower sodium" or "no salt added."  Check food labels to learn how much sodium is in 1 serving.  When eating at a restaurant, ask that your food be prepared with less salt or none, if possible. READING FOOD LABELS FOR SODIUM INFORMATION The nutrition facts label is a good place to find how much sodium is in foods. Look for products with no more than 500 to 600 mg of sodium per meal and no more than 150 mg per serving. Remember that 2 g = 2000 mg. The food label may also list foods as:  Sodium-free: Less than 5 mg in a serving.  Very low sodium: 35 mg or less in a serving.  Low-sodium: 140 mg or less in a serving.  Light in sodium: 50% less sodium in a serving. For example, if a food that usually has 300 mg of sodium is changed to become light in sodium, it will have 150 mg of sodium.  Reduced sodium: 25% less sodium in a serving. For example, if a food that usually has 400 mg of sodium is changed to reduced sodium, it will  have 300 mg of sodium. CHOOSING FOODS Grains  Avoid: Salted crackers and snack items. Some cereals, including instant hot cereals. Bread stuffing and biscuit mixes. Seasoned rice or pasta mixes.  Choose: Unsalted snack items. Low-sodium cereals, oats, puffed wheat and rice, shredded wheat. English muffins and bread. Pasta. Meats  Avoid: Salted, canned, smoked, spiced, pickled meats, including fish and poultry. Bacon, ham, sausage, cold cuts, hot dogs, anchovies.  Choose: Low-sodium canned tuna and salmon. Fresh or frozen meat, poultry, and fish. Dairy  Avoid: Processed cheese and spreads. Cottage cheese. Buttermilk and condensed milk. Regular cheese.  Choose: Milk. Low-sodium cottage cheese. Yogurt. Sour cream. Low-sodium cheese. Fruits and Vegetables  Avoid: Regular canned vegetables. Regular canned tomato sauce and paste. Frozen vegetables in sauces. Olives. Rosita Fire. Relishes. Sauerkraut.  Choose: Low-sodium canned vegetables. Low-sodium tomato sauce and paste. Frozen or fresh vegetables. Fresh and frozen fruit. Condiments  Avoid: Canned and packaged gravies. Worcestershire sauce. Tartar sauce. Barbecue sauce. Soy sauce. Steak sauce. Ketchup. Onion, garlic, and table salt. Meat flavorings and tenderizers.  Choose: Fresh and dried herbs and spices. Low-sodium varieties of mustard and ketchup. Lemon juice. Tabasco sauce. Horseradish. SAMPLE 2 GRAM SODIUM MEAL PLAN Breakfast / Sodium (mg)  1 cup low-fat milk / 143 mg  2 slices whole-wheat toast / 270 mg  1 tbs heart-healthy margarine /  153 mg  1 hard-boiled egg / 139 mg  1 small orange / 0 mg Lunch / Sodium (mg)  1 cup raw carrots / 76 mg   cup hummus / 298 mg  1 cup low-fat milk / 143 mg   cup red grapes / 2 mg  1 whole-wheat pita bread / 356 mg Dinner / Sodium (mg)  1 cup whole-wheat pasta / 2 mg  1 cup low-sodium tomato sauce / 73 mg  3 oz lean ground beef / 57 mg  1 small side salad (1 cup raw spinach  leaves,  cup cucumber,  cup yellow bell pepper) with 1 tsp olive oil and 1 tsp red wine vinegar / 25 mg Snack / Sodium (mg)  1 container low-fat vanilla yogurt / 107 mg  3 graham cracker squares / 127 mg Nutrient Analysis  Calories: 2033  Protein: 77 g  Carbohydrate: 282 g  Fat: 72 g  Sodium: 1971 mg Document Released: 02/27/2005 Document Revised: 05/22/2011 Document Reviewed: 05/31/2009 [employer] Patient Information 2014 [place], [place].

## 2013-02-12 NOTE — Assessment & Plan Note (Signed)
Smoking cessation discussed with patient. 

## 2013-02-13 ENCOUNTER — Other Ambulatory Visit: Payer: Self-pay

## 2013-02-13 ENCOUNTER — Other Ambulatory Visit: Payer: Self-pay | Admitting: Family Medicine

## 2013-02-13 MED ORDER — ALBUTEROL SULFATE (2.5 MG/3ML) 0.083% IN NEBU: INHALATION_SOLUTION | RESPIRATORY_TRACT | Status: DC

## 2013-02-26 ENCOUNTER — Ambulatory Visit (INDEPENDENT_AMBULATORY_CARE_PROVIDER_SITE_OTHER): Payer: Medicare Other | Admitting: *Deleted

## 2013-02-26 DIAGNOSIS — Z7901 Long term (current) use of anticoagulants: Secondary | ICD-10-CM

## 2013-02-26 DIAGNOSIS — I4891 Unspecified atrial fibrillation: Secondary | ICD-10-CM

## 2013-02-26 LAB — POCT INR: INR: 1.8

## 2013-03-14 ENCOUNTER — Other Ambulatory Visit: Payer: Self-pay | Admitting: Family Medicine

## 2013-03-17 ENCOUNTER — Emergency Department (HOSPITAL_COMMUNITY)
Admission: EM | Admit: 2013-03-17 | Discharge: 2013-03-17 | Disposition: A | Discharge status: Home / Self Care | Payer: Medicare Other | Attending: Emergency Medicine | Admitting: Emergency Medicine

## 2013-03-17 ENCOUNTER — Emergency Department (HOSPITAL_COMMUNITY): Payer: Medicare Other

## 2013-03-17 ENCOUNTER — Encounter (HOSPITAL_COMMUNITY): Payer: Self-pay | Admitting: Emergency Medicine

## 2013-03-17 ENCOUNTER — Other Ambulatory Visit: Payer: Self-pay

## 2013-03-17 DIAGNOSIS — J441 Chronic obstructive pulmonary disease with (acute) exacerbation: Secondary | ICD-10-CM | POA: Insufficient documentation

## 2013-03-17 DIAGNOSIS — F17200 Nicotine dependence, unspecified, uncomplicated: Secondary | ICD-10-CM | POA: Insufficient documentation

## 2013-03-17 DIAGNOSIS — I10 Essential (primary) hypertension: Secondary | ICD-10-CM | POA: Insufficient documentation

## 2013-03-17 DIAGNOSIS — G4700 Insomnia, unspecified: Secondary | ICD-10-CM

## 2013-03-17 DIAGNOSIS — I4891 Unspecified atrial fibrillation: Secondary | ICD-10-CM | POA: Insufficient documentation

## 2013-03-17 DIAGNOSIS — Z79899 Other long term (current) drug therapy: Secondary | ICD-10-CM | POA: Insufficient documentation

## 2013-03-17 DIAGNOSIS — Z8619 Personal history of other infectious and parasitic diseases: Secondary | ICD-10-CM | POA: Insufficient documentation

## 2013-03-17 DIAGNOSIS — IMO0002 Reserved for concepts with insufficient information to code with codable children: Secondary | ICD-10-CM | POA: Insufficient documentation

## 2013-03-17 DIAGNOSIS — Z9109 Other allergy status, other than to drugs and biological substances: Secondary | ICD-10-CM | POA: Insufficient documentation

## 2013-03-17 DIAGNOSIS — Z862 Personal history of diseases of the blood and blood-forming organs and certain disorders involving the immune mechanism: Secondary | ICD-10-CM | POA: Insufficient documentation

## 2013-03-17 DIAGNOSIS — H409 Unspecified glaucoma: Secondary | ICD-10-CM | POA: Insufficient documentation

## 2013-03-17 DIAGNOSIS — Z7901 Long term (current) use of anticoagulants: Secondary | ICD-10-CM | POA: Insufficient documentation

## 2013-03-17 DIAGNOSIS — I509 Heart failure, unspecified: Secondary | ICD-10-CM | POA: Insufficient documentation

## 2013-03-17 DIAGNOSIS — J45901 Unspecified asthma with (acute) exacerbation: Principal | ICD-10-CM

## 2013-03-17 DIAGNOSIS — R609 Edema, unspecified: Secondary | ICD-10-CM | POA: Insufficient documentation

## 2013-03-17 LAB — COMPREHENSIVE METABOLIC PANEL
ALK PHOS: 100 U/L (ref 39–117)
ALT: 11 U/L (ref 0–53)
AST: 21 U/L (ref 0–37)
Albumin: 4.1 g/dL (ref 3.5–5.2)
BILIRUBIN TOTAL: 0.7 mg/dL (ref 0.3–1.2)
BUN: 15 mg/dL (ref 6–23)
CALCIUM: 9.9 mg/dL (ref 8.4–10.5)
CHLORIDE: 97 meq/L (ref 96–112)
CO2: 30 meq/L (ref 19–32)
Creatinine, Ser: 0.82 mg/dL (ref 0.50–1.35)
GFR calc non Af Amer: 86 mL/min — ABNORMAL LOW (ref >90)
GLUCOSE: 122 mg/dL — AB (ref 70–99)
POTASSIUM: 4.2 meq/L (ref 3.7–5.3)
SODIUM: 139 meq/L (ref 137–147)
Total Protein: 8.5 g/dL — ABNORMAL HIGH (ref 6.0–8.3)

## 2013-03-17 LAB — CBC WITH DIFFERENTIAL/PLATELET
Basophils Absolute: 0 10*3/uL (ref 0.0–0.1)
Basophils Relative: 0 % (ref 0–1)
EOS PCT: 2 % (ref 0–5)
Eosinophils Absolute: 0.2 10*3/uL (ref 0.0–0.7)
HEMATOCRIT: 38.7 % — AB (ref 39.0–52.0)
Hemoglobin: 12.1 g/dL — ABNORMAL LOW (ref 13.0–17.0)
LYMPHS ABS: 1.2 10*3/uL (ref 0.7–4.0)
Lymphocytes Relative: 16 % (ref 12–46)
MCH: 29.1 pg (ref 26.0–34.0)
MCHC: 31.3 g/dL (ref 30.0–36.0)
MCV: 93 fL (ref 78.0–100.0)
Monocytes Absolute: 1 10*3/uL (ref 0.1–1.0)
Monocytes Relative: 14 % — ABNORMAL HIGH (ref 3–12)
NEUTROS ABS: 5 10*3/uL (ref 1.7–7.7)
Neutrophils Relative %: 67 % (ref 43–77)
Platelets: 245 10*3/uL (ref 150–400)
RBC: 4.16 MIL/uL — ABNORMAL LOW (ref 4.22–5.81)
RDW: 13.9 % (ref 11.5–15.5)
WBC: 7.4 10*3/uL (ref 4.0–10.5)

## 2013-03-17 LAB — POCT I-STAT TROPONIN I: Troponin i, poc: 0.01 ng/mL (ref 0.00–0.08)

## 2013-03-17 LAB — PROTIME-INR
INR: 2.2 — ABNORMAL HIGH (ref 0.00–1.49)
Prothrombin Time: 23.7 seconds — ABNORMAL HIGH (ref 11.6–15.2)

## 2013-03-17 LAB — CG4 I-STAT (LACTIC ACID): Lactic Acid, Venous: 1.79 mmol/L (ref 0.5–2.2)

## 2013-03-17 LAB — PRO B NATRIURETIC PEPTIDE: Pro B Natriuretic peptide (BNP): 3811 pg/mL — ABNORMAL HIGH (ref 0–125)

## 2013-03-17 MED ORDER — PREDNISONE 20 MG PO TABS: ORAL_TABLET | ORAL | Status: DC

## 2013-03-17 MED ORDER — IPRATROPIUM BROMIDE 0.02 % IN SOLN
0.5000 mg | Freq: Once | RESPIRATORY_TRACT | Status: AC
  Administered 2013-03-17: 0.5 mg via RESPIRATORY_TRACT
  Filled 2013-03-17: qty 2.5

## 2013-03-17 MED ORDER — ALBUTEROL SULFATE HFA 108 (90 BASE) MCG/ACT IN AERS: 2.0000 | INHALATION_SPRAY | RESPIRATORY_TRACT | Status: DC | PRN

## 2013-03-17 MED ORDER — TEMAZEPAM 15 MG PO CAPS: 15.0000 mg | ORAL_CAPSULE | Freq: Every evening | ORAL | Status: DC | PRN

## 2013-03-17 MED ORDER — ALBUTEROL SULFATE (2.5 MG/3ML) 0.083% IN NEBU
10.0000 mg | INHALATION_SOLUTION | Freq: Once | RESPIRATORY_TRACT | Status: AC
  Administered 2013-03-17: 10 mg via RESPIRATORY_TRACT
  Filled 2013-03-17: qty 12

## 2013-03-17 MED ORDER — PREDNISONE 50 MG PO TABS
60.0000 mg | ORAL_TABLET | Freq: Once | ORAL | Status: AC
  Administered 2013-03-17: 19:00:00 60 mg via ORAL
  Filled 2013-03-17 (×2): qty 1

## 2013-03-17 MED ORDER — ALBUTEROL SULFATE HFA 108 (90 BASE) MCG/ACT IN AERS
2.0000 | INHALATION_SPRAY | Freq: Once | RESPIRATORY_TRACT | Status: AC
  Administered 2013-03-17: 2 via RESPIRATORY_TRACT
  Filled 2013-03-17: qty 6.7

## 2013-03-17 NOTE — ED Notes (Signed)
Sob ,  Cough, white sputum,  "I think I have pneumonia"

## 2013-03-17 NOTE — ED Provider Notes (Signed)
CSN: [id number]     Arrival date & time 03/17/13  1736 History   First MD Initiated Contact with Patient 03/17/13 1809     Chief Complaint  Patient presents with  . Shortness of Breath   (Consider location/radiation/quality/duration/timing/severity/associated sxs/prior Treatment) HPI COPD heart failure chronic atrial fibrillation chronic Coumadin no home oxygen presents with several days coughing wheezing shortness of breath forgot to take his water pill the last 2 days   Past Medical History  Diagnosis Date  . Anemia     Hemoglobin 11.6 in 04/2008->resolved   . Edema     Lower Extremity  . Tobacco abuse   . Glaucoma   . Allergic rhinitis   . Atrial fibrillation     Coumadin;asymptomatic bradycardia on telemetry in 12/2009  . CHF (congestive heart failure)     H/o CHF with preserved LV EF; pulmonary hypertension; normal EF in 03/2009  . Hypertension   . Shingles   . Shortness of breath   . COPD (chronic obstructive pulmonary disease)   . Asthma    Past Surgical History  Procedure Laterality Date  . Bilateral cataract surgery    . Herniorrhapy    . Tendon repair      right hand surgical procedure for a tendon repair  . Colonoscopy N/A 11/29/2012    Procedure: COLONOSCOPY;  Surgeon: Danie Binder, MD;  Location: [facility];  Service: Endoscopy;  Laterality: N/A;  1:00  . Esophagogastroduodenoscopy N/A 11/29/2012    Procedure: ESOPHAGOGASTRODUODENOSCOPY (EGD);  Surgeon: Danie Binder, MD;  Location: [facility];  Service: Endoscopy;  Laterality: N/A;   Family History  Problem Relation Age of Onset  . Cancer Mother   . Cancer Father   . Coronary artery disease Brother   . Colon cancer Brother     and possibly father? Patient is unsure   History  Substance Use Topics  . Smoking status: Current Some Day Smoker -- 0.50 packs/day for 55 years    Types: Cigarettes    Last Attempt to Quit: 02/13/2012  . Smokeless tobacco: Never Used     Comment: occasionally smokes  .  Alcohol Use: No    Review of Systems 10 Systems reviewed and are negative for acute change except as noted in the HPI. Allergies  Aspirin  Home Medications   Current Outpatient Rx  Name  Route  Sig  Dispense  Refill  . albuterol (PROAIR HFA) 108 (90 BASE) MCG/ACT inhaler   Inhalation   Inhale 2 puffs into the lungs every 6 (six) hours as needed for wheezing or shortness of breath.         Marland Kitchen albuterol (PROVENTIL) (2.5 MG/3ML) 0.083% nebulizer solution   Nebulization   Take 2.5 mg by nebulization 2 (two) times daily.         . brimonidine-timolol (COMBIGAN) 0.2-0.5 % ophthalmic solution   Both Eyes   Place 1 drop into both eyes every 12 (twelve) hours.           . budesonide-formoterol (SYMBICORT) 160-4.5 MCG/ACT inhaler   Inhalation   Inhale 2 puffs into the lungs 2 (two) times daily.         Marland Kitchen diltiazem (CARDIZEM CD) 120 MG 24 hr capsule   Oral   Take 120 mg by mouth every morning.         . furosemide (LASIX) 40 MG tablet   Oral   Take 1 tablet (40 mg total) by mouth daily.   60 tablet  6   . montelukast (SINGULAIR) 10 MG tablet   Oral   Take 10 mg by mouth at bedtime.         . potassium chloride (K-DUR) 10 MEQ tablet   Oral   Take 10 mEq by mouth daily after breakfast.         . temazepam (RESTORIL) 15 MG capsule   Oral   Take 1 capsule (15 mg total) by mouth at bedtime as needed for sleep.   30 capsule   1   . tiotropium (SPIRIVA) 18 MCG inhalation capsule   Inhalation   Place 18 mcg into inhaler and inhale daily.         . traMADol (ULTRAM) 50 MG tablet   Oral   Take 1 tablet (50 mg total) by mouth 2 (two) times daily as needed for pain.   60 tablet   3   . travoprost, benzalkonium, (TRAVATAN Z) 0.004 % ophthalmic solution   Both Eyes   Place 1 drop into both eyes at bedtime.          Marland Kitchen warfarin (COUMADIN) 4 MG tablet   Oral   Take 4-6 mg by mouth daily. Takes one tablet on Sun, Tues, Thurs, and Sat. Takes one and one-half  tablet on Mon, Wed, and Fri.         . albuterol (PROVENTIL HFA;VENTOLIN HFA) 108 (90 BASE) MCG/ACT inhaler   Inhalation   Inhale 2 puffs into the lungs every 2 (two) hours as needed for wheezing or shortness of breath (cough).   1 Inhaler   0   . predniSONE (DELTASONE) 20 MG tablet      2 tabs po daily x 4 days   8 tablet   0   . temazepam (RESTORIL) 15 MG capsule      TAKE 1 CAPSULE BY MOUTH EVERY NIGHT AT BEDTIME AS NEEDED FOR SLEEP   30 capsule   2    BP 170/100  Pulse 83  Temp(Src) 98.1 F (36.7 C) (Oral)  Resp 22  Ht 5\' 6"  (1.676 m)  Wt 134 lb (60.782 kg)  BMI 21.64 kg/m2  SpO2 99% Physical Exam  Nursing note and vitals reviewed. Constitutional:  Awake, alert, nontoxic appearance.  HENT:  Head: Atraumatic.  Eyes: Right eye exhibits no discharge. Left eye exhibits no discharge.  Neck: Neck supple.  Cardiovascular:  No murmur heard. Irregularly irregular  Pulmonary/Chest: He is in respiratory distress. He has wheezes. He has no rales. He exhibits no tenderness.  Speaks sentences mild hypoxia pulse oximetry room air 86% diffuse wheezes mild edema  Abdominal: Soft. There is no tenderness. There is no rebound.  Musculoskeletal: He exhibits edema. He exhibits no tenderness.  Baseline ROM, no obvious new focal weakness.  Neurological: He is alert.  Mental status and motor strength appears baseline for patient and situation.  Skin: No rash noted.  Psychiatric: He has a normal mood and affect.    ED Course  Procedures (including critical care time) Pt feels improved after observation and/or treatment in ED.Patient informed of clinical course, understand medical decision-making process, and agree with plan. To my recollection repeat pulse ox was in 90's on room air. Labs Review Labs Reviewed  PROTIME-INR - Abnormal; Notable for the following:    Prothrombin Time 23.7 (*)    INR 2.20 (*)    All other components within normal limits  CBC WITH DIFFERENTIAL -  Abnormal; Notable for the following:    RBC 4.16 (*)  Hemoglobin 12.1 (*)    HCT 38.7 (*)    Monocytes Relative 14 (*)    All other components within normal limits  COMPREHENSIVE METABOLIC PANEL - Abnormal; Notable for the following:    Glucose, Bld 122 (*)    Total Protein 8.5 (*)    GFR calc non Af Amer 86 (*)    All other components within normal limits  PRO B NATRIURETIC PEPTIDE - Abnormal; Notable for the following:    Pro B Natriuretic peptide (BNP) 3811.0 (*)    All other components within normal limits  CG4 I-STAT (LACTIC ACID)  POCT I-STAT TROPONIN I   Imaging Review No results found.  EKG Interpretation    Date/Time:  Monday March 17 2013 18:30:19 EST Ventricular Rate:  89 PR Interval:    QRS Duration: 96 QT Interval:  368 QTC Calculation: 447 R Axis:   -74 Text Interpretation:  Atrial fibrillation with premature ventricular or aberrantly conducted complexes Left axis deviation When compared with ECG of 15-May-2012 00:08, Vent. rate has decreased BY  44 BPM Confirmed by [facility]  MD, Corinn Stoltzfus ([id number]) on 03/17/2013 7:23:33 PM            MDM   1. COPD exacerbation    I doubt any other EMC precluding discharge at this time including, but not necessarily limited to the following:PNA, ACS, severe HF. Suspect chronic HF.    Babette Relic, MD 03/19/13 2231

## 2013-03-17 NOTE — Discharge Instructions (Signed)
You appear to have an upper respiratory infection (URI). An upper respiratory tract infection, or cold, is a viral infection of the air passages leading to the lungs. It is contagious and can be spread to others, especially during the first 3 or 4 days. It cannot be cured by antibiotics or other medicines. RETURN IMMEDIATELY IF you develop worse shortness of breath, confusion or altered mental status, a new rash, become dizzy, faint, or poorly responsive, or are unable to be cared for at home. Restart your water pill as previously directed.

## 2013-03-17 NOTE — ED Notes (Signed)
Pt alert & oriented x4, stable gait. Patient given discharge instructions, paperwork & prescription(s). Patient  instructed to stop at the registration desk to finish any additional paperwork. Patient verbalized understanding. Pt left department w/ no further questions. 

## 2013-03-19 ENCOUNTER — Ambulatory Visit (INDEPENDENT_AMBULATORY_CARE_PROVIDER_SITE_OTHER): Payer: Medicare Other | Admitting: *Deleted

## 2013-03-19 DIAGNOSIS — I4891 Unspecified atrial fibrillation: Secondary | ICD-10-CM

## 2013-03-19 DIAGNOSIS — Z7901 Long term (current) use of anticoagulants: Secondary | ICD-10-CM

## 2013-03-19 LAB — POCT INR: INR: 3.8

## 2013-03-25 ENCOUNTER — Ambulatory Visit (INDEPENDENT_AMBULATORY_CARE_PROVIDER_SITE_OTHER): Payer: Medicare Other | Admitting: Family Medicine

## 2013-03-25 ENCOUNTER — Encounter: Payer: Self-pay | Admitting: Family Medicine

## 2013-03-25 VITALS — BP 138/64 | HR 80 | Resp 18 | Ht 67.5 in | Wt 132.0 lb

## 2013-03-25 DIAGNOSIS — I4891 Unspecified atrial fibrillation: Secondary | ICD-10-CM

## 2013-03-25 DIAGNOSIS — Z720 Tobacco use: Secondary | ICD-10-CM

## 2013-03-25 DIAGNOSIS — G4700 Insomnia, unspecified: Secondary | ICD-10-CM

## 2013-03-25 DIAGNOSIS — I5030 Unspecified diastolic (congestive) heart failure: Secondary | ICD-10-CM

## 2013-03-25 DIAGNOSIS — F17200 Nicotine dependence, unspecified, uncomplicated: Secondary | ICD-10-CM

## 2013-03-25 DIAGNOSIS — R972 Elevated prostate specific antigen [PSA]: Secondary | ICD-10-CM

## 2013-03-25 DIAGNOSIS — H409 Unspecified glaucoma: Secondary | ICD-10-CM

## 2013-03-25 DIAGNOSIS — I10 Essential (primary) hypertension: Secondary | ICD-10-CM

## 2013-03-25 DIAGNOSIS — J441 Chronic obstructive pulmonary disease with (acute) exacerbation: Secondary | ICD-10-CM

## 2013-03-25 DIAGNOSIS — I509 Heart failure, unspecified: Secondary | ICD-10-CM

## 2013-03-25 NOTE — Progress Notes (Signed)
Received correspondence from [facility] stating that patient is having problems with overuse of rescue inhaler.

## 2013-03-25 NOTE — Patient Instructions (Signed)
F/u in 4 month, call if you need me before  NEVER increase the dose of symbicort and spiriva, take as directed  oK to vary the rescue inhaler  Or the breathing treatment up to 4 times per day.  If you start needing more rescue breathing treatments , call for help, a short course of prednisone goes a LONG way

## 2013-03-30 NOTE — Assessment & Plan Note (Signed)
Controlled, no change in medication  

## 2013-03-30 NOTE — Assessment & Plan Note (Signed)
No current decompensation continue salt and fluid restriction and meds per cardiology

## 2013-03-30 NOTE — Assessment & Plan Note (Signed)
Improved following recent Ed visit, however will prescribe an additional prednisone taper based on s/s on today's visit Pt re educated re the need to use symbicort as prescribed, overusing still inappropriately , then running out of medication prematurely with uncontrolled respiratory symptoms

## 2013-03-30 NOTE — Assessment & Plan Note (Signed)
Good response to med continue same

## 2013-03-30 NOTE — Assessment & Plan Note (Signed)
Had recent evaluation by urology an dhass f/u appt scheduled reportedly

## 2013-03-30 NOTE — Assessment & Plan Note (Signed)
Current rate  within normal range

## 2013-03-30 NOTE — Progress Notes (Signed)
   Subjective:    Patient ID: Theodore [name], male    DOB: January 24, 1941, 73 y.o.   MRN: [id number]  HPI Pt in primarily for Ed follow up as a result of decompensated COPD. States though he feels much better still experiencing shortness of breath and difficulty breathing a part of his problem is hte fact that due to over use of his symbicort he is almost out of it , not able to take the effective dose needed and this is a recurrent cycle for him despite repeated attempts at pt education   Review of Systems See HPI Denies recent fever or chills. Denies sinus pressure, nasal congestion, ear pain or sore throat. C/o chest tightness , cough and wheeze no sputum production. Denies chest pains, and leg swelling, Orthopnea and palpitations are chronic Denies abdominal pain, nausea, vomiting,diarrhea or constipation.   Denies dysuria, frequency, hesitancy or incontinence. Denies joint pain, swelling and limitation in mobility. Denies headaches, seizures, numbness, or tingling. Denies depression does have mild , anxiety and  insomni a which are controlled with help of medication Denies skin break down or rash.        Objective:   Physical Exam  Patient alert and oriented and in no cardiopulmonary distress.  HEENT: No facial asymmetry, EOMI, no sinus tenderness,  oropharynx pink and moist.  Neck supple no adenopathy.  Chest: marked decreased air entry throughout no crackles, scattered high pitched wheezes  CVS: S1, S2 , no S3.  ABD: Soft non tender. Bowel sounds normal.  Ext: No edema  MS: Adequate ROM spine, shoulders, hips and knees.  Skin: Intact, no ulcerations or rash noted.  Psych: Good eye contact, normal affect. Memory intact not anxious or depressed appearing.  CNS: CN 2-12 intact, power, tone and sensation normal throughout.       Assessment & Plan:

## 2013-03-30 NOTE — Assessment & Plan Note (Signed)
Unchanged since October 2014. Patient counseled for approximately 5 minutes regarding the health risks of ongoing nicotine use, specifically all types of cancer, heart disease, stroke and respiratory failure. The options available for help with cessation ,the behavioral changes to assist the process, and the option to either gradully reduce usage  Or abruptly stop.is also discussed. Pt is also encouraged to set specific goals in number of cigarettes used daily, as well as to set a quit date.

## 2013-03-30 NOTE — Assessment & Plan Note (Signed)
Follows with opthalmology on regular basis

## 2013-04-01 ENCOUNTER — Telehealth: Payer: Self-pay

## 2013-04-01 ENCOUNTER — Inpatient Hospital Stay (HOSPITAL_COMMUNITY)
Admission: EM | Admit: 2013-04-01 | Discharge: 2013-04-04 | DRG: 151 | Disposition: A | Discharge status: Home / Self Care | Payer: Medicare Other | Attending: Internal Medicine | Admitting: Internal Medicine

## 2013-04-01 ENCOUNTER — Encounter (HOSPITAL_COMMUNITY): Payer: Self-pay | Admitting: Emergency Medicine

## 2013-04-01 DIAGNOSIS — I509 Heart failure, unspecified: Secondary | ICD-10-CM | POA: Diagnosis present

## 2013-04-01 DIAGNOSIS — F17200 Nicotine dependence, unspecified, uncomplicated: Secondary | ICD-10-CM | POA: Diagnosis present

## 2013-04-01 DIAGNOSIS — Z720 Tobacco use: Secondary | ICD-10-CM

## 2013-04-01 DIAGNOSIS — I5032 Chronic diastolic (congestive) heart failure: Secondary | ICD-10-CM | POA: Diagnosis present

## 2013-04-01 DIAGNOSIS — R791 Abnormal coagulation profile: Secondary | ICD-10-CM | POA: Diagnosis present

## 2013-04-01 DIAGNOSIS — J4489 Other specified chronic obstructive pulmonary disease: Secondary | ICD-10-CM | POA: Diagnosis present

## 2013-04-01 DIAGNOSIS — I4891 Unspecified atrial fibrillation: Secondary | ICD-10-CM

## 2013-04-01 DIAGNOSIS — J449 Chronic obstructive pulmonary disease, unspecified: Secondary | ICD-10-CM | POA: Diagnosis present

## 2013-04-01 DIAGNOSIS — R040 Epistaxis: Principal | ICD-10-CM

## 2013-04-01 DIAGNOSIS — Z79899 Other long term (current) drug therapy: Secondary | ICD-10-CM

## 2013-04-01 DIAGNOSIS — D62 Acute posthemorrhagic anemia: Secondary | ICD-10-CM

## 2013-04-01 DIAGNOSIS — I2789 Other specified pulmonary heart diseases: Secondary | ICD-10-CM | POA: Diagnosis present

## 2013-04-01 DIAGNOSIS — D689 Coagulation defect, unspecified: Secondary | ICD-10-CM

## 2013-04-01 DIAGNOSIS — I5030 Unspecified diastolic (congestive) heart failure: Secondary | ICD-10-CM

## 2013-04-01 DIAGNOSIS — J441 Chronic obstructive pulmonary disease with (acute) exacerbation: Secondary | ICD-10-CM | POA: Diagnosis present

## 2013-04-01 DIAGNOSIS — I10 Essential (primary) hypertension: Secondary | ICD-10-CM | POA: Diagnosis present

## 2013-04-01 DIAGNOSIS — E162 Hypoglycemia, unspecified: Secondary | ICD-10-CM | POA: Diagnosis present

## 2013-04-01 DIAGNOSIS — Z7901 Long term (current) use of anticoagulants: Secondary | ICD-10-CM

## 2013-04-01 LAB — PROTIME-INR
INR: 3.66 — AB (ref 0.00–1.49)
INR: 3.78 — AB (ref 0.00–1.49)
Prothrombin Time: 35 seconds — ABNORMAL HIGH (ref 11.6–15.2)
Prothrombin Time: 35.9 seconds — ABNORMAL HIGH (ref 11.6–15.2)

## 2013-04-01 LAB — BASIC METABOLIC PANEL
BUN: 29 mg/dL — AB (ref 6–23)
CHLORIDE: 101 meq/L (ref 96–112)
CO2: 30 mEq/L (ref 19–32)
Calcium: 8.4 mg/dL (ref 8.4–10.5)
Creatinine, Ser: 0.89 mg/dL (ref 0.50–1.35)
GFR calc Af Amer: >90 mL/min (ref >90)
GFR calc non Af Amer: 83 mL/min — ABNORMAL LOW (ref >90)
Glucose, Bld: 83 mg/dL (ref 70–99)
Potassium: 3.8 mEq/L (ref 3.7–5.3)
SODIUM: 142 meq/L (ref 137–147)

## 2013-04-01 LAB — CBC WITH DIFFERENTIAL/PLATELET
BASOS ABS: 0.1 10*3/uL (ref 0.0–0.1)
Basophils Relative: 1 % (ref 0–1)
EOS PCT: 1 % (ref 0–5)
Eosinophils Absolute: 0.1 10*3/uL (ref 0.0–0.7)
HCT: 28.9 % — ABNORMAL LOW (ref 39.0–52.0)
Hemoglobin: 9.2 g/dL — ABNORMAL LOW (ref 13.0–17.0)
Lymphocytes Relative: 12 % (ref 12–46)
Lymphs Abs: 1.1 10*3/uL (ref 0.7–4.0)
MCH: 28.9 pg (ref 26.0–34.0)
MCHC: 31.8 g/dL (ref 30.0–36.0)
MCV: 90.9 fL (ref 78.0–100.0)
Monocytes Absolute: 1 10*3/uL (ref 0.1–1.0)
Monocytes Relative: 12 % (ref 3–12)
NEUTROS ABS: 6.5 10*3/uL (ref 1.7–7.7)
NEUTROS PCT: 75 % (ref 43–77)
PLATELETS: 195 10*3/uL (ref 150–400)
RBC: 3.18 MIL/uL — AB (ref 4.22–5.81)
RDW: 13.8 % (ref 11.5–15.5)
WBC: 8.7 10*3/uL (ref 4.0–10.5)

## 2013-04-01 LAB — CBC
HEMATOCRIT: 35.9 % — AB (ref 39.0–52.0)
HEMOGLOBIN: 11.9 g/dL — AB (ref 13.0–17.0)
MCH: 32.5 pg (ref 26.0–34.0)
MCHC: 33.1 g/dL (ref 30.0–36.0)
MCV: 98.1 fL (ref 78.0–100.0)
Platelets: 109 10*3/uL — ABNORMAL LOW (ref 150–400)
RBC: 3.66 MIL/uL — AB (ref 4.22–5.81)
RDW: 14.2 % (ref 11.5–15.5)
WBC: 2.4 10*3/uL — AB (ref 4.0–10.5)

## 2013-04-01 LAB — GLUCOSE, CAPILLARY: GLUCOSE-CAPILLARY: 84 mg/dL (ref 70–99)

## 2013-04-01 LAB — ABO/RH: ABO/RH(D): O POS

## 2013-04-01 MED ORDER — DILTIAZEM HCL ER COATED BEADS 120 MG PO CP24
120.0000 mg | ORAL_CAPSULE | Freq: Once | ORAL | Status: AC
  Administered 2013-04-01: 120 mg via ORAL
  Filled 2013-04-01: qty 1

## 2013-04-01 MED ORDER — ACETAMINOPHEN 325 MG PO TABS
650.0000 mg | ORAL_TABLET | Freq: Once | ORAL | Status: AC
  Administered 2013-04-01: 650 mg via ORAL
  Filled 2013-04-01: qty 2

## 2013-04-01 MED ORDER — HYDROGEN PEROXIDE 3 % EX SOLN
CUTANEOUS | Status: AC
  Filled 2013-04-01: qty 473

## 2013-04-01 MED ORDER — PHYTONADIONE 5 MG PO TABS
5.0000 mg | ORAL_TABLET | Freq: Once | ORAL | Status: AC
Start: 2013-04-01 — End: 2013-04-01
  Administered 2013-04-01: 5 mg via ORAL
  Filled 2013-04-01: qty 1

## 2013-04-01 MED ORDER — CEPHALEXIN 500 MG PO CAPS
500.0000 mg | ORAL_CAPSULE | Freq: Three times a day (TID) | ORAL | Status: DC
  Administered 2013-04-01 – 2013-04-04 (×8): 500 mg via ORAL
  Filled 2013-04-01 (×5): qty 1
  Filled 2013-04-01: qty 2
  Filled 2013-04-01 (×7): qty 1

## 2013-04-01 NOTE — ED Notes (Signed)
Patient states he started having a nose bleed this morning at 0600. Patient has a history of nose bleeds, but "hasn't had one in a really long time." Patient is currently on Warfarin 4mg  daily.

## 2013-04-01 NOTE — Consult Note (Signed)
Reason for Consult:Epistaxis Referring Physician: ER  Theodore [name] is an 73 y.o. male.  HPI: 73 year old male with A. Fib on Coumadin presented to ER this morning after 1.5 hours of bleeding from right nose and into throat.  At [place], various packing attempted but continued to bleed.  Was transferred to [facility] where he bled further.  INR is 3.66 and he has received vitamin K.  FFP is ordered.  Admission is planned due to acute hemorrhagic anemia.  Consult is requested due to ongoing bleeding.  Past Medical History  Diagnosis Date  . Anemia     Hemoglobin 11.6 in 04/2008->resolved   . Edema     Lower Extremity  . Tobacco abuse   . Glaucoma   . Allergic rhinitis   . Atrial fibrillation     Coumadin;asymptomatic bradycardia on telemetry in 12/2009  . CHF (congestive heart failure)     H/o CHF with preserved LV EF; pulmonary hypertension; normal EF in 03/2009  . Hypertension   . Shingles   . Shortness of breath   . COPD (chronic obstructive pulmonary disease)   . Asthma     Past Surgical History  Procedure Laterality Date  . Bilateral cataract surgery    . Herniorrhapy    . Tendon repair      right hand surgical procedure for a tendon repair  . Colonoscopy N/A 11/29/2012    Procedure: COLONOSCOPY;  Surgeon: Danie Binder, MD;  Location: [facility];  Service: Endoscopy;  Laterality: N/A;  1:00  . Esophagogastroduodenoscopy N/A 11/29/2012    Procedure: ESOPHAGOGASTRODUODENOSCOPY (EGD);  Surgeon: Danie Binder, MD;  Location: [facility];  Service: Endoscopy;  Laterality: N/A;    Family History  Problem Relation Age of Onset  . Cancer Mother   . Cancer Father   . Coronary artery disease Brother   . Colon cancer Brother     and possibly father? Patient is unsure    Social History:  reports that he has been smoking Cigarettes.  He has a 27.5 pack-year smoking history. He has never used smokeless tobacco. He reports that he does not drink alcohol or use illicit  drugs.  Allergies:  Allergies  Allergen Reactions  . Aspirin     On coumadin    Medications: I have reviewed the patient's current medications.  Results for orders placed during the hospital encounter of 04/01/13 (from the past 48 hour(s))  PROTIME-INR     Status: Abnormal   Collection Time    04/01/13  9:39 AM      Result Value Range   Prothrombin Time 35.0 (*) 11.6 - 15.2 seconds   INR 3.66 (*) 0.00 - 1.49  CBC     Status: Abnormal   Collection Time    04/01/13  9:39 AM      Result Value Range   WBC 2.4 (*) 4.0 - 10.5 K/uL   RBC 3.66 (*) 4.22 - 5.81 MIL/uL   Hemoglobin 11.9 (*) 13.0 - 17.0 g/dL   HCT 35.9 (*) 39.0 - 52.0 %   MCV 98.1  78.0 - 100.0 fL   MCH 32.5  26.0 - 34.0 pg   MCHC 33.1  30.0 - 36.0 g/dL   RDW 14.2  11.5 - 15.5 %   Platelets 109 (*) 150 - 400 K/uL  GLUCOSE, CAPILLARY     Status: None   Collection Time    04/01/13  3:26 PM      Result  Value Range   Glucose-Capillary 84  70 - 99 mg/dL  CBC WITH DIFFERENTIAL     Status: Abnormal   Collection Time    04/01/13  7:01 PM      Result Value Range   WBC 8.7  4.0 - 10.5 K/uL   RBC 3.18 (*) 4.22 - 5.81 MIL/uL   Hemoglobin 9.2 (*) 13.0 - 17.0 g/dL   HCT 28.9 (*) 39.0 - 52.0 %   MCV 90.9  78.0 - 100.0 fL   MCH 28.9  26.0 - 34.0 pg   MCHC 31.8  30.0 - 36.0 g/dL   RDW 13.8  11.5 - 15.5 %   Platelets 195  150 - 400 K/uL   Neutrophils Relative % 75  43 - 77 %   Neutro Abs 6.5  1.7 - 7.7 K/uL   Lymphocytes Relative 12  12 - 46 %   Lymphs Abs 1.1  0.7 - 4.0 K/uL   Monocytes Relative 12  3 - 12 %   Monocytes Absolute 1.0  0.1 - 1.0 K/uL   Eosinophils Relative 1  0 - 5 %   Eosinophils Absolute 0.1  0.0 - 0.7 K/uL   Basophils Relative 1  0 - 1 %   Basophils Absolute 0.1  0.0 - 0.1 K/uL  PROTIME-INR     Status: Abnormal   Collection Time    04/01/13  7:01 PM      Result Value Range   Prothrombin Time 35.9 (*) 11.6 - 15.2 seconds   INR 3.78 (*) 0.00 - 3.56  BASIC METABOLIC PANEL     Status: Abnormal    Collection Time    04/01/13  7:01 PM      Result Value Range   Sodium 142  137 - 147 mEq/L   Potassium 3.8  3.7 - 5.3 mEq/L   Chloride 101  96 - 112 mEq/L   CO2 30  19 - 32 mEq/L   Glucose, Bld 83  70 - 99 mg/dL   BUN 29 (*) 6 - 23 mg/dL   Creatinine, Ser 0.89  0.50 - 1.35 mg/dL   Calcium 8.4  8.4 - 10.5 mg/dL   GFR calc non Af Amer 83 (*) >90 mL/min   GFR calc Af Amer >90  >90 mL/min   Comment: (NOTE)     The eGFR has been calculated using the CKD EPI equation.     This calculation has not been validated in all clinical situations.     eGFR's persistently <90 mL/min signify possible Chronic Kidney     Disease.    No results found.  Review of Systems  HENT: Positive for nosebleeds.   All other systems reviewed and are negative.   Blood pressure 158/90, pulse 99, temperature 98.1 F (36.7 C), temperature source Oral, resp. rate 22, SpO2 91.00%. Physical Exam  Constitutional: He is oriented to person, place, and time. He appears well-developed and well-nourished. No distress.  HENT:  Head: Normocephalic and atraumatic.  Right Ear: External ear normal.  Left Ear: External ear normal.  Bilateral rapid rhino packs in nose.  Bleeding out mouth.  Eyes: Conjunctivae and EOM are normal. Pupils are equal, round, and reactive to light.  Neck: Normal range of motion. Neck supple.  Cardiovascular: Normal rate.   Respiratory: Effort normal.  GI:  Did not examine.  Genitourinary:  Did not examine.  Musculoskeletal: Normal range of motion.  Neurological: He is alert and oriented to person, place, and time. No cranial nerve deficit.  Skin: Skin is warm and dry.  Psychiatric: He has a normal mood and affect. His behavior is normal. Judgment and thought content normal.    Assessment/Plan: Right posterior epistaxis, anticoagulation See procedure note.  The indwelling nasal packs were removed and bleeding was confirmed to be posterior in origin.  A 10 cm Merocel pack was placed in the  right nasal passage and bleeding was controlled.  Agree with admission.  I recommend holding Coumadin until pack is safely removed in 3-5 days.  I recommend treatment with cephalexin while pack is in place.  Pack can be removed as outpatient if he is felt medically stable for discharge.  Jasie Meleski 04/01/2013, 10:22 PM

## 2013-04-01 NOTE — ED Notes (Signed)
Attempt x1 to stick for blood. Unsuccessful. Phlebotomy contacted to get blood draw

## 2013-04-01 NOTE — ED Notes (Signed)
Pt from [facility] for nosebleed. Has bilateral nasal packing which is the 2nd set of packing he has had placed.

## 2013-04-01 NOTE — Telephone Encounter (Signed)
Patient states that he awoke with epistasis this am.  Blood coming from both nostrils.  Duration approx 1 hour.  Advised to hold head down and apply pressure to the bridge of his nose.  Instructed that if bleeding did not stop within 20 minutes to seek care at the ED.

## 2013-04-01 NOTE — ED Notes (Signed)
Waiting for medication from pharmacy.

## 2013-04-01 NOTE — ED Provider Notes (Signed)
CSN: [id number]     Arrival date & time 04/01/13  8366 History  This chart was scribed for Sharyon Cable, MD by Roxan Diesel, ED scribe.  This patient was seen in room APA07/APA07 and the patient's care was started at 9:32 AM.   Chief Complaint  Patient presents with  . Epistaxis    Patient is a 73 y.o. male presenting with nosebleeds. The history is provided by the patient. No language interpreter was used.  Epistaxis Location:  R nare Severity:  Moderate Duration: 1.5 hours. Timing:  Constant Chronicity:  New Context: anticoagulants   Context: not trauma   Associated symptoms: no fever     HPI Comments: Theodore [name] is a 73 y.o. male with h/o A-fib on chronic Coumadin, CHF, and COPD who presents to the Emergency Department complaining of a persistent nosebleed that began this morning on waking.  Pt states that he has been bleeding from his right nostril for around 1.5 hours and the blood is draining down the back of his throat.  He denies bleeding from the left nostril.  He denies any other recent epistaxis before today.  He denies recent injury that may have caused bleeding.  He denies fever, vomiting, CP, SOB, weakness, or abdominal pain   Past Medical History  Diagnosis Date  . Anemia     Hemoglobin 11.6 in 04/2008->resolved   . Edema     Lower Extremity  . Tobacco abuse   . Glaucoma   . Allergic rhinitis   . Atrial fibrillation     Coumadin;asymptomatic bradycardia on telemetry in 12/2009  . CHF (congestive heart failure)     H/o CHF with preserved LV EF; pulmonary hypertension; normal EF in 03/2009  . Hypertension   . Shingles   . Shortness of breath   . COPD (chronic obstructive pulmonary disease)   . Asthma     Past Surgical History  Procedure Laterality Date  . Bilateral cataract surgery    . Herniorrhapy    . Tendon repair      right hand surgical procedure for a tendon repair  . Colonoscopy N/A 11/29/2012    Procedure: COLONOSCOPY;  Surgeon:  Danie Binder, MD;  Location: [facility];  Service: Endoscopy;  Laterality: N/A;  1:00  . Esophagogastroduodenoscopy N/A 11/29/2012    Procedure: ESOPHAGOGASTRODUODENOSCOPY (EGD);  Surgeon: Danie Binder, MD;  Location: [facility];  Service: Endoscopy;  Laterality: N/A;    Family History  Problem Relation Age of Onset  . Cancer Mother   . Cancer Father   . Coronary artery disease Brother   . Colon cancer Brother     and possibly father? Patient is unsure    History  Substance Use Topics  . Smoking status: Current Some Day Smoker -- 0.50 packs/day for 55 years    Types: Cigarettes    Last Attempt to Quit: 02/13/2012  . Smokeless tobacco: Never Used     Comment: occasionally smokes  . Alcohol Use: No     Review of Systems  Constitutional: Negative for fever.  HENT: Positive for nosebleeds.   All other systems reviewed and are negative.     Allergies  Aspirin  Home Medications   Current Outpatient Rx  Name  Route  Sig  Dispense  Refill  . albuterol (PROAIR HFA) 108 (90 BASE) MCG/ACT inhaler   Inhalation   Inhale 2 puffs into the lungs every 6 (six) hours as needed for wheezing or shortness of breath.         Marland Kitchen  albuterol (PROVENTIL HFA;VENTOLIN HFA) 108 (90 BASE) MCG/ACT inhaler   Inhalation   Inhale 2 puffs into the lungs every 2 (two) hours as needed for wheezing or shortness of breath (cough).   1 Inhaler   0   . albuterol (PROVENTIL) (2.5 MG/3ML) 0.083% nebulizer solution   Nebulization   Take 2.5 mg by nebulization 2 (two) times daily.         . brimonidine-timolol (COMBIGAN) 0.2-0.5 % ophthalmic solution   Both Eyes   Place 1 drop into both eyes every 12 (twelve) hours.           . budesonide-formoterol (SYMBICORT) 160-4.5 MCG/ACT inhaler   Inhalation   Inhale 2 puffs into the lungs 2 (two) times daily.         Marland Kitchen diltiazem (CARDIZEM CD) 120 MG 24 hr capsule   Oral   Take 120 mg by mouth every morning.         . furosemide (LASIX) 40  MG tablet   Oral   Take 1 tablet (40 mg total) by mouth daily.   60 tablet   6   . montelukast (SINGULAIR) 10 MG tablet   Oral   Take 10 mg by mouth at bedtime.         . potassium chloride (K-DUR) 10 MEQ tablet   Oral   Take 10 mEq by mouth daily after breakfast.         . predniSONE (DELTASONE) 20 MG tablet      2 tabs po daily x 4 days   8 tablet   0   . temazepam (RESTORIL) 15 MG capsule      TAKE 1 CAPSULE BY MOUTH EVERY NIGHT AT BEDTIME AS NEEDED FOR SLEEP   30 capsule   2   . temazepam (RESTORIL) 15 MG capsule   Oral   Take 1 capsule (15 mg total) by mouth at bedtime as needed for sleep.   30 capsule   1   . tiotropium (SPIRIVA) 18 MCG inhalation capsule   Inhalation   Place 18 mcg into inhaler and inhale daily.         . traMADol (ULTRAM) 50 MG tablet   Oral   Take 1 tablet (50 mg total) by mouth 2 (two) times daily as needed for pain.   60 tablet   3   . travoprost, benzalkonium, (TRAVATAN Z) 0.004 % ophthalmic solution   Both Eyes   Place 1 drop into both eyes at bedtime.          Marland Kitchen warfarin (COUMADIN) 4 MG tablet   Oral   Take 4-6 mg by mouth daily. Takes one tablet on Sun, Tues, Thurs, and Sat. Takes one and one-half tablet on Mon, Wed, and Fri.          BP 168/104  Pulse 89  Temp(Src) 98.2 F (36.8 C) (Oral)  Resp 24  SpO2 93%   Physical Exam  Nursing note and vitals reviewed. CONSTITUTIONAL: Well developed/well nourished HEAD: Normocephalic/atraumatic EYES: EOMI/PERRL ENMT: Mucous membranes moist.  Area of active bleeding to anterior septum right nare.  No signs of trauma.  No active bleeding in oropharynx. NECK: supple no meningeal signs SPINE:entire spine nontender CV: S1/S2 noted, no murmurs/rubs/gallops noted LUNGS: Lungs are clear to auscultation bilaterally, no apparent distress ABDOMEN: soft, nontender, no rebound or guarding GU:no cva tenderness NEURO: Pt is awake/alert, moves all extremitiesx4 EXTREMITIES: pulses  normal, full ROM SKIN: warm, color normal PSYCH: no abnormalities of mood noted  ED Course  EPISTAXIS MANAGEMENT Date/Time: 04/01/2013 2:03 PM Performed by: Sharyon Cable Authorized by: Sharyon Cable Consent: Verbal consent obtained. Consent given by: patient Patient sedated: no Treatment site: left anterior Treatment complexity: complex Patient tolerance: Patient tolerated the procedure well with no immediate complications.    DIAGNOSTIC STUDIES: Oxygen Saturation is 93% on room air, adequate by my interpretation.    COORDINATION OF CARE: 9:36 AM-Discussed treatment plan which includes nasal packing with pt at bedside and pt agreed to plan.    EPISTAXIS MANAGEMENT Performed by: Sharyon Cable, MD Authorized by: Sharyon Cable, MD Consent: Verbal consent obtained Consent given by: patient Patient understanding: patient states understanding of the procedure being performed  Patient identity confirmed:  Time out: Immediately prior to procedure a "time out" was called to verify the correct patient, procedure, equipment, support staff and site/side marked as required. Anesthesia method: lidocaine w/ epinephrine Treatment site: Right nare Repair method: Rhino Rocket Treatment complexity: simple Patient tolerance: Patient tolerated the procedure well with no immediate complications.   11:06 AM Pt advised to hold next two doses of coumadin (today/tomorrow) I had to change packing to 7.5 rhino rocket Pt tolerated well 2:02 PM Pt had started to bleed out of left nare Left nare was packed as well Anterior bleeding seems to have stoppped, however he is still has trickle down throat I spoke to on call dr bates He requests removing left packing, monitor and if still bleeding will need transferred to [place] [facility] 3:11 PM Packing in left nare was removed, and he began to have continued epistaxis Bleeding from right nare is improved He still has bleeding in  oropharynx Left nare was packed again with rhino rocket Given continued bleeding will transfer to [place] for further evaluation by ENT Will give vitamin K to help reverse coumadin (initial plan was to hold coumadin at discharge)   Labs Review Labs Reviewed  PROTIME-INR - Abnormal; Notable for the following:    Prothrombin Time 35.0 (*)    INR 3.66 (*)    All other components within normal limits  CBC - Abnormal; Notable for the following:    WBC 2.4 (*)    RBC 3.66 (*)    Hemoglobin 11.9 (*)    HCT 35.9 (*)    Platelets 109 (*)    All other components within normal limits    Imaging Review No results found.  EKG Interpretation   None       MDM  No diagnosis found. Nursing notes including past medical history and social history reviewed and considered in documentation Labs/vital reviewed and considered     I personally performed the services described in this documentation, which was scribed in my presence. The recorded information has been reviewed and is accurate.      Sharyon Cable, MD 04/01/13 [phone]

## 2013-04-01 NOTE — H&P (Addendum)
[facility] History and Physical  Leaf [facility] [place] FTD:[id number] DOB: 03-02-1941 DOA: 04/01/2013  Referring physician: ER physician PCP: Tula Nakayama, MD   Chief Complaint: nose bleeding  HPI:  73 year old male with past medical history of hypertension, atrial fibrillation on coumadin who presented to [facility] ED 04/01/2013 with ongoing nose bleeds and moth bleed starting on the morning of the admission. Pt reports having previous history of nose bleeds but nothing like this time. He is able to protect airways. No reports of coughing up blood but blood is coming from mouth.  No fever or chills. No chest pain, shortness of breath. No blood in stool or urine. No lightheadedness or LOC. In ED, vitals are stable with BP 158/90, saturating above 95% on room air. He had anterior nasal packing done by ENT 2 times. His blood work revealed hemoglobin of 11.9 and then 9.2. Platelet count was 109 but repeated 195. INR was 3.66. Because of on ongoing bleed he will receive vitamin K and FFP in ED. TRH to admit for further management.   Assessment and Plan:  Principal Problem:   Epistaxis - likely provoked by coumadin - coumadin on hold - anterior packing done by ENT; pt still has nose bleeds; able to protect airways - give FFP, order done in ED; also put order for vitamin K 10 mg once as pt still bleeding - per ENT while pt has nasal packing he should be on cephalexin, order placed  Active Problems:   Acute blood loss anemia - secondary to epistaxis - management as above - hemoglobin 9.2    HYPERTENSION - continue Cardizem and lasix   Atrial fibrillation - hold coumadin due to epistaxis - rate controlled with cardizem   COPD - continue home BD per home regimen  Radiological Exams on Admission: No results found.   Code Status: Full Family Communication: Pt at bedside Disposition Plan: Admit for further evaluation  Leisa Lenz, MD  [facility] Pager [phone]  Review of  Systems:  Constitutional: Negative for fever, chills and malaise/fatigue. Negative for diaphoresis.  HENT: positive for bleeding from nose.   Eyes: Negative for blurred vision, double vision, photophobia, pain, discharge and redness.  Respiratory: positive for coughing up blood, sputum production, shortness of breath, wheezing and stridor.   Cardiovascular: Negative for chest pain, palpitations, orthopnea, claudication and leg swelling.  Gastrointestinal: Negative for nausea, vomiting and abdominal pain. Negative for heartburn, constipation, blood in stool and melena.  Genitourinary: Negative for dysuria, urgency, frequency, hematuria and flank pain.  Musculoskeletal: Negative for myalgias, back pain, joint pain and falls.  Skin: Negative for itching and rash.  Neurological: Negative for dizziness and weakness. Negative for tingling, tremors, sensory change, speech change, focal weakness, loss of consciousness and headaches.  Endo/Heme/Allergies: Negative for environmental allergies and polydipsia. Does not bruise/bleed easily.  Psychiatric/Behavioral: Negative for suicidal ideas. The patient is not nervous/anxious.      Past Medical History  Diagnosis Date  . Anemia     Hemoglobin 11.6 in 04/2008->resolved   . Edema     Lower Extremity  . Tobacco abuse   . Glaucoma   . Allergic rhinitis   . Atrial fibrillation     Coumadin;asymptomatic bradycardia on telemetry in 12/2009  . CHF (congestive heart failure)     H/o CHF with preserved LV EF; pulmonary hypertension; normal EF in 03/2009  . Hypertension   . Shingles   . Shortness of breath   . COPD (chronic obstructive pulmonary disease)   .  Asthma    Past Surgical History  Procedure Laterality Date  . Bilateral cataract surgery    . Herniorrhapy    . Tendon repair      right hand surgical procedure for a tendon repair  . Colonoscopy N/A 11/29/2012    Procedure: COLONOSCOPY;  Surgeon: Danie Binder, MD;  Location: [facility];   Service: Endoscopy;  Laterality: N/A;  1:00  . Esophagogastroduodenoscopy N/A 11/29/2012    Procedure: ESOPHAGOGASTRODUODENOSCOPY (EGD);  Surgeon: Danie Binder, MD;  Location: [facility];  Service: Endoscopy;  Laterality: N/A;   Social History:  reports that he has been smoking Cigarettes.  He has a 27.5 pack-year smoking history. He has never used smokeless tobacco. He reports that he does not drink alcohol or use illicit drugs.  Allergies  Allergen Reactions  . Aspirin     On coumadin    Family History  Problem Relation Age of Onset  . Cancer Mother   . Cancer Father   . Coronary artery disease Brother   . Colon cancer Brother     and possibly father? Patient is unsure     Prior to Admission medications   Medication Sig Start Date End Date Taking? Authorizing Provider  albuterol (PROAIR HFA) 108 (90 BASE) MCG/ACT inhaler Inhale 2 puffs into the lungs every 6 (six) hours as needed for wheezing or shortness of breath.   Yes Historical Provider, MD  albuterol (PROVENTIL) (2.5 MG/3ML) 0.083% nebulizer solution Take 2.5 mg by nebulization 2 (two) times daily.   Yes Historical Provider, MD  brimonidine-timolol (COMBIGAN) 0.2-0.5 % ophthalmic solution Place 1 drop into both eyes every 12 (twelve) hours.     Yes Historical Provider, MD  budesonide-formoterol (SYMBICORT) 160-4.5 MCG/ACT inhaler Inhale 2 puffs into the lungs 2 (two) times daily.   Yes Historical Provider, MD  diltiazem (CARDIZEM CD) 120 MG 24 hr capsule Take 120 mg by mouth every morning.   Yes Historical Provider, MD  furosemide (LASIX) 40 MG tablet Take 1 tablet (40 mg total) by mouth daily. 02/12/13  Yes Imogene Burn, PA-C  montelukast (SINGULAIR) 10 MG tablet Take 10 mg by mouth at bedtime.   Yes Historical Provider, MD  potassium chloride (K-DUR) 10 MEQ tablet Take 10 mEq by mouth daily after breakfast.   Yes Historical Provider, MD  temazepam (RESTORIL) 15 MG capsule Take 1 capsule (15 mg total) by mouth at  bedtime as needed for sleep. 03/17/13  Yes Fayrene Helper, MD  tiotropium (SPIRIVA) 18 MCG inhalation capsule Place 18 mcg into inhaler and inhale daily.   Yes Historical Provider, MD  traMADol (ULTRAM) 50 MG tablet Take 1 tablet (50 mg total) by mouth 2 (two) times daily as needed for pain. 08/13/12 08/13/13 Yes Fayrene Helper, MD  travoprost, benzalkonium, (TRAVATAN Z) 0.004 % ophthalmic solution Place 1 drop into both eyes at bedtime.    Yes Historical Provider, MD  warfarin (COUMADIN) 4 MG tablet Take 4-6 mg by mouth daily. Takes one tablet on Sun, Tues, Thurs, and Sat. Takes one and one-half tablet on Mon, Wed, and Fri.   Yes Historical Provider, MD   Physical Exam: Filed Vitals:   04/01/13 2000 04/01/13 2030 04/01/13 2045 04/01/13 2100  BP: 161/91 145/88 135/80 158/90  Pulse:      Temp:      TempSrc:      Resp: 18 21 19 22   SpO2:        Physical Exam  Constitutional: Appears ill, has  nose bleeding  HENT: Normocephalic. Appears to have dry mucus membranes   Eyes: Conjunctivae and EOM are normal. PERRLA, no scleral icterus.  Neck: Normal ROM. Neck supple. No JVD.   CVS: irregular rhythm, tachycardic, S1/S2 apprecaited Pulmonary: Effort and breath sounds normal, no stridor, rhonchi, wheezes, rales.  Abdominal: Soft. BS +,  no distension, tenderness, rebound or guarding.  Musculoskeletal: Normal range of motion. No edema and no tenderness.  Lymphadenopathy: No lymphadenopathy noted, cervical, inguinal. Neuro: Alert. No focal neurologic deficits  Skin: Skin is warm and dry. No rash noted. Psychiatric: Normal mood and affect. Behavior, judgment, thought content normal.   Labs on Admission:  Basic Metabolic Panel:  Recent Labs Lab 04/01/13 1901  NA 142  K 3.8  CL 101  CO2 30  GLUCOSE 83  BUN 29*  CREATININE 0.89  CALCIUM 8.4   Liver Function Tests: No results found for this basename: AST, ALT, ALKPHOS, BILITOT, PROT, ALBUMIN,  in the last 168 hours No results found  for this basename: LIPASE, AMYLASE,  in the last 168 hours No results found for this basename: AMMONIA,  in the last 168 hours CBC:  Recent Labs Lab 04/01/13 0939 04/01/13 1901  WBC 2.4* 8.7  NEUTROABS  --  6.5  HGB 11.9* 9.2*  HCT 35.9* 28.9*  MCV 98.1 90.9  PLT 109* 195   Cardiac Enzymes: No results found for this basename: CKTOTAL, CKMB, CKMBINDEX, TROPONINI,  in the last 168 hours BNP: No components found with this basename: POCBNP,  CBG:  Recent Labs Lab 04/01/13 1526  GLUCAP 84    If 7PM-7AM, please contact night-coverage [email or web address] Password [email or web address] 04/01/2013, 11:18 PM

## 2013-04-01 NOTE — ED Notes (Signed)
Patient is currently coughing up "blood" at this time. Patient states that he started coughing it up approx 10 minutes ago. NAD noted at this time. MD made aware.

## 2013-04-01 NOTE — ED Notes (Signed)
ENT doctor at bedside.

## 2013-04-01 NOTE — ED Provider Notes (Signed)
Plan was d/w dr Ashok Cordia, EDP at [facility], MD 04/01/13 [phone]

## 2013-04-01 NOTE — ED Provider Notes (Signed)
CSN: [id number]     Arrival date & time 04/01/13  9024 History   First MD Initiated Contact with Patient 04/01/13 [phone]     Chief Complaint  Patient presents with  . Epistaxis    HPI: Mr. Ong is an 73 yo M with history of atrial fibrillation on coumadin, CHF with preserved EF, HTN and COPD who presents with epistaxis from OSH. Bleeding started this morning without provocation. He attempted to hold direct pressure but it continued to bleed so he went to an OSH. There he had nasal packing applied twice but continued to bleed, so he was sent to our facility for evaluation. On arrival he is mildly tachycardic but otherwise HD stable. No active bleeding noted. He denies any pain or trouble breathing. At the OSH his Hgb was 11.9, INR 3.6. He was given vitamin K but no other reversal agent. He endorses intermittent dizziness with head movement but no chest pain, SOB or abdominal pain.    Past Medical History  Diagnosis Date  . Anemia     Hemoglobin 11.6 in 04/2008->resolved   . Edema     Lower Extremity  . Tobacco abuse   . Glaucoma   . Allergic rhinitis   . Atrial fibrillation     Coumadin;asymptomatic bradycardia on telemetry in 12/2009  . CHF (congestive heart failure)     H/o CHF with preserved LV EF; pulmonary hypertension; normal EF in 03/2009  . Hypertension   . Shingles   . Shortness of breath   . COPD (chronic obstructive pulmonary disease)   . Asthma    Past Surgical History  Procedure Laterality Date  . Bilateral cataract surgery    . Herniorrhapy    . Tendon repair      right hand surgical procedure for a tendon repair  . Colonoscopy N/A 11/29/2012    Procedure: COLONOSCOPY;  Surgeon: Danie Binder, MD;  Location: [facility];  Service: Endoscopy;  Laterality: N/A;  1:00  . Esophagogastroduodenoscopy N/A 11/29/2012    Procedure: ESOPHAGOGASTRODUODENOSCOPY (EGD);  Surgeon: Danie Binder, MD;  Location: [facility];  Service: Endoscopy;  Laterality: N/A;   Family  History  Problem Relation Age of Onset  . Cancer Mother   . Cancer Father   . Coronary artery disease Brother   . Colon cancer Brother     and possibly father? Patient is unsure   History  Substance Use Topics  . Smoking status: Current Some Day Smoker -- 0.50 packs/day for 55 years    Types: Cigarettes    Last Attempt to Quit: 02/13/2012  . Smokeless tobacco: Never Used     Comment: occasionally smokes  . Alcohol Use: No    Review of Systems  Constitutional: Negative for fever, chills, appetite change and fatigue.  HENT: Positive for nosebleeds.   Eyes: Negative for photophobia and visual disturbance.  Respiratory: Negative for cough and shortness of breath.   Cardiovascular: Negative for chest pain and leg swelling.  Gastrointestinal: Negative for nausea, vomiting, abdominal pain, diarrhea and constipation.  Genitourinary: Negative for dysuria, frequency and decreased urine volume.  Musculoskeletal: Negative for arthralgias, back pain, gait problem and myalgias.  Skin: Negative for color change and wound.  Neurological: Positive for headaches (earlier today, now resolved). Negative for dizziness, syncope and light-headedness.  Psychiatric/Behavioral: Negative for confusion and agitation.  All other systems reviewed and are negative.    Allergies  Aspirin  Home Medications   Current Outpatient Rx  Name  Route  Sig  Dispense  Refill  . albuterol (PROAIR HFA) 108 (90 BASE) MCG/ACT inhaler   Inhalation   Inhale 2 puffs into the lungs every 6 (six) hours as needed for wheezing or shortness of breath.         Marland Kitchen albuterol (PROVENTIL) (2.5 MG/3ML) 0.083% nebulizer solution   Nebulization   Take 2.5 mg by nebulization 2 (two) times daily.         . brimonidine-timolol (COMBIGAN) 0.2-0.5 % ophthalmic solution   Both Eyes   Place 1 drop into both eyes every 12 (twelve) hours.           . budesonide-formoterol (SYMBICORT) 160-4.5 MCG/ACT inhaler   Inhalation    Inhale 2 puffs into the lungs 2 (two) times daily.         Marland Kitchen diltiazem (CARDIZEM CD) 120 MG 24 hr capsule   Oral   Take 120 mg by mouth every morning.         . furosemide (LASIX) 40 MG tablet   Oral   Take 1 tablet (40 mg total) by mouth daily.   60 tablet   6   . montelukast (SINGULAIR) 10 MG tablet   Oral   Take 10 mg by mouth at bedtime.         . potassium chloride (K-DUR) 10 MEQ tablet   Oral   Take 10 mEq by mouth daily after breakfast.         . temazepam (RESTORIL) 15 MG capsule   Oral   Take 1 capsule (15 mg total) by mouth at bedtime as needed for sleep.   30 capsule   1   . tiotropium (SPIRIVA) 18 MCG inhalation capsule   Inhalation   Place 18 mcg into inhaler and inhale daily.         . traMADol (ULTRAM) 50 MG tablet   Oral   Take 1 tablet (50 mg total) by mouth 2 (two) times daily as needed for pain.   60 tablet   3   . travoprost, benzalkonium, (TRAVATAN Z) 0.004 % ophthalmic solution   Both Eyes   Place 1 drop into both eyes at bedtime.          Marland Kitchen warfarin (COUMADIN) 4 MG tablet   Oral   Take 4-6 mg by mouth daily. Takes one tablet on Sun, Tues, Thurs, and Sat. Takes one and one-half tablet on Mon, Wed, and Fri.          BP 148/88  Pulse 99  Temp(Src) 98.1 F (36.7 C) (Oral)  Resp 18  SpO2 91% Physical Exam  Nursing note and vitals reviewed. Constitutional: He is oriented to person, place, and time. He appears well-developed. No distress.  Elderly male, sitting up in bed, bilateral nasal packs in place, appears comfortable.   HENT:  Head: Normocephalic and atraumatic.  Nose: Right sinus exhibits no maxillary sinus tenderness and no frontal sinus tenderness. Left sinus exhibits no maxillary sinus tenderness and no frontal sinus tenderness.  Mouth/Throat: Oropharynx is clear and moist.  Dry blood in posterior pharynx. No active bleeding noted.  Bilateral nasal packs in place.   Eyes: Conjunctivae and EOM are normal. Pupils are  equal, round, and reactive to light.  Neck: Normal range of motion. Neck supple.  Cardiovascular: Normal heart sounds and intact distal pulses.  An irregularly irregular rhythm present. Tachycardia present.   Pulmonary/Chest: Effort normal and breath sounds normal. No respiratory distress.  Abdominal: Soft. Bowel sounds are normal. There is  no tenderness. There is no rebound and no guarding.  Musculoskeletal: Normal range of motion. He exhibits no edema and no tenderness.  Neurological: He is alert and oriented to person, place, and time. No cranial nerve deficit. Coordination normal.  Skin: Skin is warm and dry. No rash noted.  Psychiatric: He has a normal mood and affect. His behavior is normal.    ED Course  Procedures (including critical care time) Labs Review Labs Reviewed  PROTIME-INR - Abnormal; Notable for the following:    Prothrombin Time 35.0 (*)    INR 3.66 (*)    All other components within normal limits  CBC - Abnormal; Notable for the following:    WBC 2.4 (*)    RBC 3.66 (*)    Hemoglobin 11.9 (*)    HCT 35.9 (*)    Platelets 109 (*)    All other components within normal limits  GLUCOSE, CAPILLARY  CBC WITH DIFFERENTIAL  PROTIME-INR   Imaging Review No results found.  EKG Interpretation   None       MDM   73 yo M with history of atrial fibrillation on coumadin, CHF with preserved EF, HTN and COPD who presents with epistaxis from OSH. Mild tachycardia otherwise HD stable. No airway compromise. Hgb repeated, down 2.5 grams from earlier today. INR 3.7, initially we did not reveres him because he was not bleeding. His electrolytes are unrevealing. After being in the ED for a few hours, he began to bleed again. ENT consulted, they repacked R nare. They feel the bleeding is posterior. Given his recurrent bleeding and Hgb drop, will given FFP. Also given home dose of cardizem for tachycardia. His HR normalized. He did not have any HD instability. He was admitted to  the Hospitalist for serial Hgb and observation. Patient updated on w/u and plan, he was in agreement with plan.   Reviewed labs and previous medical records, utilized in MDM  Discussed case with Dr. Lita Mains.   Clinical Impression 1. Posterior epistaxis.  2. Acute blood loss anemia 3. Supratherapeutic INR due to coumadin   Louretta Shorten, MD 04/02/13 [phone]

## 2013-04-01 NOTE — ED Notes (Signed)
Dr. Jerilynn Mages. Allen at the bedside

## 2013-04-01 NOTE — ED Notes (Signed)
Phlebotomy at the bedside  

## 2013-04-01 NOTE — ED Notes (Signed)
[facility] called. Gave report to Daine Gravel, RN. Waiting on transportation.

## 2013-04-01 NOTE — Procedures (Signed)
Preop diagnosis: Right posterior epistaxis Postop diagnosis: same Procedure: Right anterior/posterior nasal packing Surgeon: Redmond Baseman Anesth: 4% topical Lidocaine Compl: None Description: After discussing risks, benefits, and alternatives, both rapid rhino packs (anterior packs) were removed and the nasal passages suctioned of clot.  He was also asked to blow clot fully out of the nose.  Active bleeding was seen coming from the posterior right nasal passage.  The exact bleeding source could not be seen.  The right nasal passage was packed with cotton soaked in topical anesthetic for a couple of minutes and then removed.  A 10 cm Merocel pack coated in Bacitracin ointment was then placed in the right nasal passage and saturated with lidocaine.  Bleeding stopped.  The string was taped to the right cheek and a drip pad was applied.  The patient was returned to nursing care in stable condition.

## 2013-04-02 DIAGNOSIS — J449 Chronic obstructive pulmonary disease, unspecified: Secondary | ICD-10-CM

## 2013-04-02 DIAGNOSIS — J4489 Other specified chronic obstructive pulmonary disease: Secondary | ICD-10-CM

## 2013-04-02 LAB — COMPREHENSIVE METABOLIC PANEL
ALT: 12 U/L (ref 0–53)
ALT: 11 U/L (ref 0–53)
AST: 23 U/L (ref 0–37)
AST: 21 U/L (ref 0–37)
Albumin: 2.7 g/dL — ABNORMAL LOW (ref 3.5–5.2)
Albumin: 2.7 g/dL — ABNORMAL LOW (ref 3.5–5.2)
Alkaline Phosphatase: 77 U/L (ref 39–117)
Alkaline Phosphatase: 77 U/L (ref 39–117)
BUN: 39 mg/dL — AB (ref 6–23)
BUN: 38 mg/dL — ABNORMAL HIGH (ref 6–23)
CALCIUM: 8.8 mg/dL (ref 8.4–10.5)
CO2: 27 meq/L (ref 19–32)
CO2: 28 meq/L (ref 19–32)
Calcium: 8.7 mg/dL (ref 8.4–10.5)
Chloride: 101 mEq/L (ref 96–112)
Chloride: 103 mEq/L (ref 96–112)
Creatinine, Ser: 0.87 mg/dL (ref 0.50–1.35)
Creatinine, Ser: 0.88 mg/dL (ref 0.50–1.35)
GFR calc Af Amer: >90 mL/min (ref >90)
GFR, EST NON AFRICAN AMERICAN: 84 mL/min — AB (ref >90)
GFR, EST NON AFRICAN AMERICAN: 83 mL/min — AB (ref >90)
GLUCOSE: 89 mg/dL (ref 70–99)
GLUCOSE: 81 mg/dL (ref 70–99)
POTASSIUM: 4.5 meq/L (ref 3.7–5.3)
Potassium: 4.4 mEq/L (ref 3.7–5.3)
Sodium: 142 mEq/L (ref 137–147)
Sodium: 143 mEq/L (ref 137–147)
TOTAL PROTEIN: 6.4 g/dL (ref 6.0–8.3)
Total Bilirubin: 0.6 mg/dL (ref 0.3–1.2)
Total Bilirubin: 0.6 mg/dL (ref 0.3–1.2)
Total Protein: 6.2 g/dL (ref 6.0–8.3)

## 2013-04-02 LAB — CBC WITH DIFFERENTIAL/PLATELET
Basophils Absolute: 0 10*3/uL (ref 0.0–0.1)
Basophils Relative: 0 % (ref 0–1)
EOS ABS: 0.1 10*3/uL (ref 0.0–0.7)
EOS PCT: 1 % (ref 0–5)
HCT: 24.3 % — ABNORMAL LOW (ref 39.0–52.0)
Hemoglobin: 7.7 g/dL — ABNORMAL LOW (ref 13.0–17.0)
Lymphocytes Relative: 17 % (ref 12–46)
Lymphs Abs: 1.4 10*3/uL (ref 0.7–4.0)
MCH: 28.9 pg (ref 26.0–34.0)
MCHC: 31.7 g/dL (ref 30.0–36.0)
MCV: 91.4 fL (ref 78.0–100.0)
Monocytes Absolute: 1.1 10*3/uL — ABNORMAL HIGH (ref 0.1–1.0)
Monocytes Relative: 13 % — ABNORMAL HIGH (ref 3–12)
NEUTROS PCT: 68 % (ref 43–77)
Neutro Abs: 5.9 10*3/uL (ref 1.7–7.7)
Platelets: 191 10*3/uL (ref 150–400)
RBC: 2.66 MIL/uL — ABNORMAL LOW (ref 4.22–5.81)
RDW: 14 % (ref 11.5–15.5)
WBC: 8.6 10*3/uL (ref 4.0–10.5)

## 2013-04-02 LAB — GLUCOSE, CAPILLARY
GLUCOSE-CAPILLARY: 62 mg/dL — AB (ref 70–99)
GLUCOSE-CAPILLARY: 55 mg/dL — AB (ref 70–99)
Glucose-Capillary: 114 mg/dL — ABNORMAL HIGH (ref 70–99)
Glucose-Capillary: 22 mg/dL — CL (ref 70–99)
Glucose-Capillary: 38 mg/dL — CL (ref 70–99)
Glucose-Capillary: 47 mg/dL — ABNORMAL LOW (ref 70–99)
Glucose-Capillary: 56 mg/dL — ABNORMAL LOW (ref 70–99)

## 2013-04-02 LAB — PROTIME-INR
INR: 1.54 — ABNORMAL HIGH (ref 0.00–1.49)
Prothrombin Time: 18.1 seconds — ABNORMAL HIGH (ref 11.6–15.2)

## 2013-04-02 LAB — BASIC METABOLIC PANEL
BUN: 32 mg/dL — AB (ref 6–23)
CHLORIDE: 103 meq/L (ref 96–112)
CO2: 26 mEq/L (ref 19–32)
Calcium: 8.6 mg/dL (ref 8.4–10.5)
Creatinine, Ser: 0.9 mg/dL (ref 0.50–1.35)
GFR calc non Af Amer: 82 mL/min — ABNORMAL LOW (ref >90)
Glucose, Bld: 76 mg/dL (ref 70–99)
POTASSIUM: 3.8 meq/L (ref 3.7–5.3)
Sodium: 143 mEq/L (ref 137–147)

## 2013-04-02 LAB — PREPARE FRESH FROZEN PLASMA
UNIT DIVISION: 0
UNIT DIVISION: 0
Unit division: 0

## 2013-04-02 LAB — PREPARE RBC (CROSSMATCH)

## 2013-04-02 LAB — CBC
HEMATOCRIT: 25.3 % — AB (ref 39.0–52.0)
Hemoglobin: 8.3 g/dL — ABNORMAL LOW (ref 13.0–17.0)
MCH: 29.7 pg (ref 26.0–34.0)
MCHC: 32.8 g/dL (ref 30.0–36.0)
MCV: 90.7 fL (ref 78.0–100.0)
Platelets: 180 10*3/uL (ref 150–400)
RBC: 2.79 MIL/uL — ABNORMAL LOW (ref 4.22–5.81)
RDW: 13.9 % (ref 11.5–15.5)
WBC: 10.9 10*3/uL — AB (ref 4.0–10.5)

## 2013-04-02 LAB — APTT: aPTT: 36 seconds (ref 24–37)

## 2013-04-02 LAB — OCCULT BLOOD X 1 CARD TO LAB, STOOL: Fecal Occult Bld: POSITIVE — AB

## 2013-04-02 LAB — MAGNESIUM: MAGNESIUM: 1.8 mg/dL (ref 1.5–2.5)

## 2013-04-02 LAB — PHOSPHORUS: Phosphorus: 2.4 mg/dL (ref 2.3–4.6)

## 2013-04-02 LAB — TSH: TSH: 0.743 u[IU]/mL (ref 0.350–4.500)

## 2013-04-02 LAB — HEMOGLOBIN AND HEMATOCRIT, BLOOD
HCT: 30.5 % — ABNORMAL LOW (ref 39.0–52.0)
Hemoglobin: 9.7 g/dL — ABNORMAL LOW (ref 13.0–17.0)

## 2013-04-02 LAB — MRSA PCR SCREENING: MRSA by PCR: NEGATIVE

## 2013-04-02 MED ORDER — HYDROCODONE-ACETAMINOPHEN 5-325 MG PO TABS
1.0000 | ORAL_TABLET | ORAL | Status: DC | PRN
  Filled 2013-04-02: qty 2

## 2013-04-02 MED ORDER — SODIUM CHLORIDE 0.9 % IJ SOLN
3.0000 mL | Freq: Two times a day (BID) | INTRAMUSCULAR | Status: DC
  Administered 2013-04-02 – 2013-04-04 (×4): 3 mL via INTRAVENOUS

## 2013-04-02 MED ORDER — TRAVOPROST 0.004 % OP SOLN: 1.0000 [drp] | Freq: Every day | OPHTHALMIC | Status: DC

## 2013-04-02 MED ORDER — TIOTROPIUM BROMIDE MONOHYDRATE 18 MCG IN CAPS
18.0000 ug | ORAL_CAPSULE | Freq: Every day | RESPIRATORY_TRACT | Status: DC
  Administered 2013-04-02 – 2013-04-04 (×3): 18 ug via RESPIRATORY_TRACT
  Filled 2013-04-02: qty 5

## 2013-04-02 MED ORDER — ONDANSETRON HCL 4 MG PO TABS: 4.0000 mg | ORAL_TABLET | Freq: Four times a day (QID) | ORAL | Status: DC | PRN

## 2013-04-02 MED ORDER — ACETAMINOPHEN 325 MG PO TABS
650.0000 mg | ORAL_TABLET | Freq: Once | ORAL | Status: AC
  Administered 2013-04-02: 650 mg via ORAL
  Filled 2013-04-02: qty 2

## 2013-04-02 MED ORDER — BRIMONIDINE TARTRATE 0.2 % OP SOLN
1.0000 [drp] | Freq: Two times a day (BID) | OPHTHALMIC | Status: DC
  Administered 2013-04-02 – 2013-04-04 (×5): 1 [drp] via OPHTHALMIC
  Filled 2013-04-02: qty 5

## 2013-04-02 MED ORDER — ONDANSETRON HCL 4 MG/2ML IJ SOLN: 4.0000 mg | Freq: Four times a day (QID) | INTRAMUSCULAR | Status: DC | PRN

## 2013-04-02 MED ORDER — TIMOLOL MALEATE 0.5 % OP SOLN
1.0000 [drp] | Freq: Two times a day (BID) | OPHTHALMIC | Status: DC
  Administered 2013-04-02 – 2013-04-04 (×5): 1 [drp] via OPHTHALMIC
  Filled 2013-04-02: qty 5

## 2013-04-02 MED ORDER — ACETAMINOPHEN 325 MG PO TABS: 650.0000 mg | ORAL_TABLET | Freq: Four times a day (QID) | ORAL | Status: DC | PRN

## 2013-04-02 MED ORDER — FUROSEMIDE 10 MG/ML IJ SOLN
INTRAMUSCULAR | Status: AC
  Filled 2013-04-02: qty 4

## 2013-04-02 MED ORDER — ALBUTEROL SULFATE (2.5 MG/3ML) 0.083% IN NEBU: 2.5000 mg | INHALATION_SOLUTION | Freq: Four times a day (QID) | RESPIRATORY_TRACT | Status: DC | PRN

## 2013-04-02 MED ORDER — MONTELUKAST SODIUM 10 MG PO TABS
10.0000 mg | ORAL_TABLET | Freq: Every day | ORAL | Status: DC
  Administered 2013-04-02 – 2013-04-03 (×2): 10 mg via ORAL
  Filled 2013-04-02 (×3): qty 1

## 2013-04-02 MED ORDER — POTASSIUM CHLORIDE ER 10 MEQ PO TBCR
10.0000 meq | EXTENDED_RELEASE_TABLET | Freq: Every day | ORAL | Status: DC
  Administered 2013-04-02 – 2013-04-04 (×3): 10 meq via ORAL
  Filled 2013-04-02 (×4): qty 1

## 2013-04-02 MED ORDER — SODIUM CHLORIDE 0.9 % IV SOLN
INTRAVENOUS | Status: DC
  Administered 2013-04-02: 75 mL/h via INTRAVENOUS

## 2013-04-02 MED ORDER — BRIMONIDINE TARTRATE-TIMOLOL 0.2-0.5 % OP SOLN: 1.0000 [drp] | Freq: Two times a day (BID) | OPHTHALMIC | Status: DC

## 2013-04-02 MED ORDER — DEXTROSE 50 % IV SOLN
INTRAVENOUS | Status: AC
  Filled 2013-04-02: qty 50

## 2013-04-02 MED ORDER — DIPHENHYDRAMINE HCL 50 MG/ML IJ SOLN
25.0000 mg | Freq: Once | INTRAMUSCULAR | Status: AC
  Administered 2013-04-02: 25 mg via INTRAVENOUS
  Filled 2013-04-02: qty 1

## 2013-04-02 MED ORDER — TRAMADOL HCL 50 MG PO TABS: 50.0000 mg | ORAL_TABLET | Freq: Two times a day (BID) | ORAL | Status: DC | PRN

## 2013-04-02 MED ORDER — VITAMIN K1 10 MG/ML IJ SOLN
10.0000 mg | Freq: Once | INTRAVENOUS | Status: AC
  Administered 2013-04-02: 10 mg via INTRAVENOUS
  Filled 2013-04-02: qty 1

## 2013-04-02 MED ORDER — LATANOPROST 0.005 % OP SOLN
1.0000 [drp] | Freq: Every day | OPHTHALMIC | Status: DC
  Administered 2013-04-02 – 2013-04-03 (×2): 1 [drp] via OPHTHALMIC
  Filled 2013-04-02: qty 2.5

## 2013-04-02 MED ORDER — BUDESONIDE-FORMOTEROL FUMARATE 160-4.5 MCG/ACT IN AERO
2.0000 | INHALATION_SPRAY | Freq: Two times a day (BID) | RESPIRATORY_TRACT | Status: DC
  Administered 2013-04-02 – 2013-04-04 (×5): 2 via RESPIRATORY_TRACT
  Filled 2013-04-02: qty 6

## 2013-04-02 MED ORDER — ALBUTEROL SULFATE (2.5 MG/3ML) 0.083% IN NEBU
2.5000 mg | INHALATION_SOLUTION | Freq: Two times a day (BID) | RESPIRATORY_TRACT | Status: DC
  Administered 2013-04-02 – 2013-04-04 (×5): 2.5 mg via RESPIRATORY_TRACT
  Filled 2013-04-02 (×5): qty 3

## 2013-04-02 MED ORDER — ALBUTEROL SULFATE HFA 108 (90 BASE) MCG/ACT IN AERS: 2.0000 | INHALATION_SPRAY | Freq: Four times a day (QID) | RESPIRATORY_TRACT | Status: DC | PRN

## 2013-04-02 MED ORDER — TEMAZEPAM 15 MG PO CAPS
15.0000 mg | ORAL_CAPSULE | Freq: Every evening | ORAL | Status: DC | PRN
  Administered 2013-04-04: 15 mg via ORAL
  Filled 2013-04-02: qty 1

## 2013-04-02 MED ORDER — FUROSEMIDE 10 MG/ML IJ SOLN
20.0000 mg | Freq: Once | INTRAMUSCULAR | Status: AC
  Administered 2013-04-02: 20 mg via INTRAVENOUS

## 2013-04-02 MED ORDER — ACETAMINOPHEN 650 MG RE SUPP
650.0000 mg | Freq: Four times a day (QID) | RECTAL | Status: DC | PRN
Start: 2013-04-02 — End: 2013-04-04

## 2013-04-02 MED ORDER — DEXTROSE-NACL 5-0.9 % IV SOLN
INTRAVENOUS | Status: DC
  Administered 2013-04-02: 75 mL/h via INTRAVENOUS

## 2013-04-02 MED ORDER — FUROSEMIDE 40 MG PO TABS
40.0000 mg | ORAL_TABLET | Freq: Every day | ORAL | Status: DC
  Administered 2013-04-02 – 2013-04-04 (×3): 40 mg via ORAL
  Filled 2013-04-02 (×3): qty 1

## 2013-04-02 MED ORDER — DEXTROSE 50 % IV SOLN
1.0000 | Freq: Once | INTRAVENOUS | Status: AC
  Administered 2013-04-02: 50 mL via INTRAVENOUS

## 2013-04-02 NOTE — Progress Notes (Signed)
Pt received with nose still bleeding though it was repacked by ENT MD. Pt constantly holding washcloth to nose to catch the drainage. Rt AC iv access with constant beeping due to arm being bent. More guaze applied to nose and ace wrapped. Unable to do admission history at this time, will endorse to am RN to complete when pt can speak clearly.

## 2013-04-02 NOTE — ED Notes (Signed)
Patient being transported to floor by Creta Levin, RN. No reaction to FFP administration at time of transport.

## 2013-04-02 NOTE — Progress Notes (Signed)
Noted to be hypoglycemic again this evening-however assymptomatic and currently eating dinner, no hx of DM-will confirm with BMET, and change CBG to every 4 hours. Currently getting a PRBC transfusion and off IVF that contains D5. Continue to monitor. A1C ordered

## 2013-04-02 NOTE — ED Notes (Signed)
enroute to 2H unit pt coughed and bleeding increased, no other obvious changes, remains alert, NAD, calm, interactive, resps e/u, speaking in clear complete sentences, self wiping dripping blood from nose/face, remains stable, HR 110. Post FFP start VS obtained upon arrival to unit. Care handed over to [facility] staff. NSL placed upon arrival to unit (2nd IV access site).

## 2013-04-02 NOTE — Care Management Note (Signed)
    Page 1 of 1   04/02/2013     11:20:11 AM   CARE MANAGEMENT NOTE 04/02/2013  Patient:  Theodore [name], Theodore [name]   Account Number:  [id number]  Date Initiated:  04/02/2013  Documentation initiated by:  Elissa Hefty  Subjective/Objective Assessment:   adm w epistaxis     Action/Plan:   lives w wife, pcp dr Joycelyn Schmid simpson   Anticipated DC Date:     Anticipated DC Plan:           Choice offered to / List presented to:             Status of service:   Medicare Important Message given?   (If response is "NO", the following Medicare IM given date fields will be blank) Date Medicare IM given:   Date Additional Medicare IM given:    Discharge Disposition:    Per UR Regulation:  Reviewed for med. necessity/level of care/duration of stay  If discussed at [place] of Stay Meetings, dates discussed:    Comments:

## 2013-04-02 NOTE — Progress Notes (Addendum)
PATIENT DETAILS Name: Theodore [name] Age: 73 y.o. Sex: male Date of Birth: 02/26/1941 Admit Date: 04/01/2013 Admitting Physician Robbie Lis, MD SNK:[id number] Moshe Cipro, MD  Subjective: Better, mild soakage of nasal dressing with blood. He does not feel like he is swallowing blood anymore-like he did on admission.  Assessment/Plan: Principal Problem:   Epistaxis - Seems to have significantly improved, mild soakage of the nasal packing. However patient claims he is not swallowing but not any longer as he was doing prior to admission. - INR was supratherapeutic on admission, patient is on chronic Coumadin therapy for atrial fibrillation. On admission he was given vitamin K and FFP. INR has subsequently come down to 1.54. Coumadin continues to be on hold, would only resume when indicated by ENT, per last ENT note, to resume in 3-4 days after nasal packing has been removed. - Continues to be on prophylactic Keflex therapy- day 12  Acute blood loss anemia - Secondary to above - Hemoglobin has significantly decreased to 7.7, however bleeding seems to have stopped/slowed down significantly, will go ahead and transfuse 1 unit of PRBC. Will continue to monitor hemoglobin and hematocrit closely.  Hypertension - Blood pressure moderately controlled with Cardizem, Lasix - Continue these medications, follow blood pressure in for another 24 hours before adjusting. Suspect IV fluids and may be elevating the blood pressure as well.  Atrial fibrillation - Rate controlled, stable - Continue Cardizem for rate control - Coumadin currently on hold for epistaxis  Chronic diastolic heart failure - Clinically compensated - Watch cautiously while on IV fluids - Continue Lasix  Mild hypoglycemia - ? Secondary to poor oral intake - Change IV fluids to D5 normal saline, encourage oral intake - Check A1c - Patient currently asymptomatic, continue to follow CBGs closely. If continues to be  persistently hypoglycemic and then would initiate further workup  History of COPD - Stable, lungs clear - Continue Spiriva, continue albuterol nebs and budesonide  Disposition: Remain inpatient  DVT Prophylaxis: SCD's  Code Status: Full code   Family Communication None at bedside  Procedures: Right anterior/posterior nasal packing by ENT on 1/20  CONSULTS:  cardiology  Time spent 40 minutes-which includes 50% of the time with face-to-face with patient/ family and coordinating care related to the above assessment and plan.    MEDICATIONS: Scheduled Meds: . albuterol  2.5 mg Nebulization BID  . brimonidine  1 drop Both Eyes Q12H  . budesonide-formoterol  2 puff Inhalation BID  . cephALEXin  500 mg Oral Q8H  . furosemide  40 mg Oral Daily  . latanoprost  1 drop Both Eyes QHS  . montelukast  10 mg Oral QHS  . potassium chloride  10 mEq Oral QPC breakfast  . sodium chloride  3 mL Intravenous Q12H  . timolol  1 drop Both Eyes BID  . tiotropium  18 mcg Inhalation QAC breakfast   Continuous Infusions: . dextrose 5 % and 0.9% NaCl     PRN Meds:.acetaminophen, acetaminophen, albuterol, HYDROcodone-acetaminophen, ondansetron (ZOFRAN) IV, ondansetron, temazepam, traMADol  Antibiotics: Anti-infectives   Start     Dose/Rate Route Frequency Ordered Stop   04/01/13 2330  cephALEXin (KEFLEX) capsule 500 mg     500 mg Oral 3 times per day 04/01/13 2321         PHYSICAL EXAM: Vital signs in last 24 hours: Filed Vitals:   04/02/13 0751 04/02/13 0808 04/02/13 1000 04/02/13 1100  BP: 173/94 157/87 165/72 165/72  Pulse: 94   82  Temp:  98.7 F (37.1 C)   98.4 F (36.9 C)  TempSrc: Oral   Oral  Resp: 21 24  22   Height:      Weight:      SpO2: 96%   98%    Weight change:  Filed Weights   04/02/13 0110  Weight: 60.357 kg (133 lb 1 oz)   Body mass index is 21.49 kg/(m^2).   Gen Exam: Awake and alert with clear speech.  Nasal packing in place, mild soakage with  blood. Neck: Supple, No JVD.   Chest: B/L Clear.   CVS: S1 S2 Regular, no murmurs.  Abdomen: soft, BS +, non tender, non distended.  Extremities: no edema, lower extremities warm to touch. Neurologic: Non Focal.   Skin: No Rash.   Wounds: N/A.   Intake/Output from previous day:  Intake/Output Summary (Last 24 hours) at 04/02/13 1250 Last data filed at 04/02/13 1200  Gross per 24 hour  Intake 1557.25 ml  Output    650 ml  Net 907.25 ml     LAB RESULTS: CBC  Recent Labs Lab 04/01/13 0939 04/01/13 1901 04/02/13 0340 04/02/13 0725  WBC 2.4* 8.7 10.9* 8.6  HGB 11.9* 9.2* 8.3* 7.7*  HCT 35.9* 28.9* 25.3* 24.3*  PLT 109* 195 180 191  MCV 98.1 90.9 90.7 91.4  MCH 32.5 28.9 29.7 28.9  MCHC 33.1 31.8 32.8 31.7  RDW 14.2 13.8 13.9 14.0  LYMPHSABS  --  1.1  --  1.4  MONOABS  --  1.0  --  1.1*  EOSABS  --  0.1  --  0.1  BASOSABS  --  0.1  --  0.0    Chemistries   Recent Labs Lab 04/01/13 1901 04/02/13 0340 04/02/13 0725  NA 142 142 143  K 3.8 4.4 4.5  CL 101 101 103  CO2 30 27 28   GLUCOSE 83 89 81  BUN 29* 39* 38*  CREATININE 0.89 0.87 0.88  CALCIUM 8.4 8.8 8.7  MG  --   --  1.8    CBG:  Recent Labs Lab 04/01/13 1526 04/02/13 0753 04/02/13 1155  GLUCAP 84 56* 62*    GFR Estimated Creatinine Clearance: 63.9 ml/min (by C-G formula based on Cr of 0.88).  Coagulation profile  Recent Labs Lab 04/01/13 0939 04/01/13 1901 04/02/13 0725  INR 3.66* 3.78* 1.54*    Cardiac Enzymes No results found for this basename: CK, CKMB, TROPONINI, MYOGLOBIN,  in the last 168 hours  No components found with this basename: POCBNP,  No results found for this basename: DDIMER,  in the last 72 hours No results found for this basename: HGBA1C,  in the last 72 hours No results found for this basename: CHOL, HDL, LDLCALC, TRIG, CHOLHDL, LDLDIRECT,  in the last 72 hours No results found for this basename: TSH, T4TOTAL, FREET3, T3FREE, THYROIDAB,  in the last 72  hours No results found for this basename: VITAMINB12, FOLATE, FERRITIN, TIBC, IRON, RETICCTPCT,  in the last 72 hours No results found for this basename: LIPASE, AMYLASE,  in the last 72 hours  Urine Studies No results found for this basename: UACOL, UAPR, USPG, UPH, UTP, UGL, UKET, UBIL, UHGB, UNIT, UROB, ULEU, UEPI, UWBC, URBC, UBAC, CAST, CRYS, UCOM, BILUA,  in the last 72 hours  MICROBIOLOGY: Recent Results (from the past 240 hour(s))  MRSA PCR SCREENING     Status: None   Collection Time    04/02/13 12:57 AM      Result Value Range Status  MRSA by PCR NEGATIVE  NEGATIVE Final   Comment:            The GeneXpert MRSA Assay (FDA     approved for NASAL specimens     only), is one component of a     comprehensive MRSA colonization     surveillance program. It is not     intended to diagnose MRSA     infection nor to guide or     monitor treatment for     MRSA infections.    RADIOLOGY STUDIES/RESULTS: Dg Chest 2 View  03/17/2013   CLINICAL DATA:  Shortness of breath. History of COPD and congestive heart failure.  EXAM: CHEST  2 VIEW  COMPARISON:  CT chest 03/18/2011 and PA and lateral chest 06/05/2012.  FINDINGS: There is cardiomegaly without edema. The lungs are emphysematous but clear. No pneumothorax or pleural effusion.  IMPRESSION: Cardiomegaly without edema.  Emphysema.   Electronically Signed   By: Inge Rise M.D.   On: 03/17/2013 19:48    Oren Binet, MD  [facility] Pager:[phone]  If 7PM-7AM, please contact night-coverage [email or web address] Password [email or web address] 04/02/2013, 12:50 PM   LOS: 1 day

## 2013-04-03 LAB — PROTIME-INR
INR: 1.31 (ref 0.00–1.49)
Prothrombin Time: 16 seconds — ABNORMAL HIGH (ref 11.6–15.2)

## 2013-04-03 LAB — TYPE AND SCREEN
ABO/RH(D): O POS
ANTIBODY SCREEN: NEGATIVE
Unit division: 0

## 2013-04-03 LAB — CORTISOL: Cortisol, Plasma: 14.7 ug/dL

## 2013-04-03 LAB — BASIC METABOLIC PANEL
BUN: 24 mg/dL — AB (ref 6–23)
CALCIUM: 8.2 mg/dL — AB (ref 8.4–10.5)
CO2: 28 mEq/L (ref 19–32)
Chloride: 103 mEq/L (ref 96–112)
Creatinine, Ser: 0.86 mg/dL (ref 0.50–1.35)
GFR calc Af Amer: >90 mL/min (ref >90)
GFR, EST NON AFRICAN AMERICAN: 84 mL/min — AB (ref >90)
Glucose, Bld: 91 mg/dL (ref 70–99)
Potassium: 3.6 mEq/L — ABNORMAL LOW (ref 3.7–5.3)
Sodium: 143 mEq/L (ref 137–147)

## 2013-04-03 LAB — GLUCOSE, CAPILLARY
GLUCOSE-CAPILLARY: 156 mg/dL — AB (ref 70–99)
GLUCOSE-CAPILLARY: 48 mg/dL — AB (ref 70–99)
GLUCOSE-CAPILLARY: 49 mg/dL — AB (ref 70–99)
GLUCOSE-CAPILLARY: 61 mg/dL — AB (ref 70–99)
GLUCOSE-CAPILLARY: 85 mg/dL (ref 70–99)
Glucose-Capillary: 83 mg/dL (ref 70–99)
Glucose-Capillary: 61 mg/dL — ABNORMAL LOW (ref 70–99)
Glucose-Capillary: 130 mg/dL — ABNORMAL HIGH (ref 70–99)
Glucose-Capillary: 224 mg/dL — ABNORMAL HIGH (ref 70–99)

## 2013-04-03 LAB — CBC
HEMATOCRIT: 25.1 % — AB (ref 39.0–52.0)
Hemoglobin: 8 g/dL — ABNORMAL LOW (ref 13.0–17.0)
MCH: 28.5 pg (ref 26.0–34.0)
MCHC: 31.9 g/dL (ref 30.0–36.0)
MCV: 89.3 fL (ref 78.0–100.0)
Platelets: 178 10*3/uL (ref 150–400)
RBC: 2.81 MIL/uL — ABNORMAL LOW (ref 4.22–5.81)
RDW: 15.4 % (ref 11.5–15.5)
WBC: 9.1 10*3/uL (ref 4.0–10.5)

## 2013-04-03 LAB — HEMOGLOBIN AND HEMATOCRIT, BLOOD
HCT: 26.6 % — ABNORMAL LOW (ref 39.0–52.0)
HCT: 29.1 % — ABNORMAL LOW (ref 39.0–52.0)
Hemoglobin: 8.8 g/dL — ABNORMAL LOW (ref 13.0–17.0)
Hemoglobin: 9.6 g/dL — ABNORMAL LOW (ref 13.0–17.0)

## 2013-04-03 LAB — HEMOGLOBIN A1C
HEMOGLOBIN A1C: 5.5 % (ref <5.7)
Hgb A1c MFr Bld: 5.9 % — ABNORMAL HIGH (ref <5.7)
Mean Plasma Glucose: 111 mg/dL (ref <117)
Mean Plasma Glucose: 123 mg/dL — ABNORMAL HIGH (ref <117)

## 2013-04-03 LAB — INSULIN, RANDOM: Insulin: 16 u[IU]/mL (ref 3–28)

## 2013-04-03 LAB — TSH: TSH: 2.98 u[IU]/mL (ref 0.350–4.500)

## 2013-04-03 LAB — C-PEPTIDE: C-Peptide: 2.32 ng/mL (ref 0.80–3.90)

## 2013-04-03 MED ORDER — WHITE PETROLATUM GEL
Status: AC
  Administered 2013-04-03: 14:00:00
  Filled 2013-04-03: qty 5

## 2013-04-03 MED ORDER — DILTIAZEM HCL ER COATED BEADS 120 MG PO CP24
120.0000 mg | ORAL_CAPSULE | Freq: Every day | ORAL | Status: DC
  Administered 2013-04-03: 120 mg via ORAL
  Filled 2013-04-03 (×2): qty 1

## 2013-04-03 MED ORDER — GLUCAGON HCL (RDNA) 1 MG IJ SOLR
1.0000 mg | Freq: Once | INTRAMUSCULAR | Status: AC
  Administered 2013-04-03: 1 mg via INTRAVENOUS
  Filled 2013-04-03: qty 1

## 2013-04-03 MED ORDER — GLUCOSE 40 % PO GEL
ORAL | Status: AC
  Administered 2013-04-03: 37.5 g
  Filled 2013-04-03: qty 1

## 2013-04-03 MED ORDER — ALBUTEROL SULFATE (2.5 MG/3ML) 0.083% IN NEBU
INHALATION_SOLUTION | RESPIRATORY_TRACT | Status: AC
  Filled 2013-04-03: qty 3

## 2013-04-03 MED ORDER — DILTIAZEM HCL 25 MG/5ML IV SOLN
10.0000 mg | Freq: Once | INTRAVENOUS | Status: AC
  Administered 2013-04-03: 10 mg via INTRAVENOUS
  Filled 2013-04-03: qty 5

## 2013-04-03 MED ORDER — PREDNISONE 50 MG PO TABS
50.0000 mg | ORAL_TABLET | Freq: Every day | ORAL | Status: DC
  Administered 2013-04-03: 50 mg via ORAL
  Filled 2013-04-03 (×3): qty 1

## 2013-04-03 MED ORDER — DEXTROSE 10 % IV SOLN
INTRAVENOUS | Status: DC
  Administered 2013-04-03 – 2013-04-04 (×4): via INTRAVENOUS

## 2013-04-03 NOTE — Progress Notes (Signed)
INITIAL NUTRITION ASSESSMENT  DOCUMENTATION CODES Per approved criteria  -Moderate malnutrition in context of chronic illness   INTERVENTION: 1.  General healthful diet; encourage intake of foods and beverages as able.  RD to follow and assess for nutritional adequacy.   NUTRITION DIAGNOSIS: Unintended wt change related to variable intake as evidenced by pt report.   Monitor:  1.  Food/Beverage; pt meeting >/=90% estimated needs with tolerance. 2.  Wt/wt change; monitor trends  Reason for Assessment: Theodore [name]  73 y.o. male  Admitting Dx: Epistaxis  ASSESSMENT: Pt admitted with nose bleed.  RD drawn to chart due to Theodore [name] score.  Pt endorses 10 lbs wt change in unspecified amount of time.  Pt is a poor historian for dietary recall. He does not have a routine or schedule.  He cooks as home as his wife cannot stand in the kitchen for long amounts of time.  He also take her to dialysis.  Discussed nutrition needs and ways to achieve.  RD notes ongoing work up for hypoglycemia.  Lab Results  Component Value Date   HGBA1C 5.5 04/02/2013   Nutrition Focused Physical Exam: Subcutaneous Fat:  Orbital Region: mild wasting Upper Arm Region: mild-moderate wasting Thoracic and Lumbar Region: mild-moderate wasting  Muscle:  Temple Region: mild wasting Clavicle Bone Region: mild-moderate wasting Clavicle and Acromion Bone Region: severe wasting Scapular Bone Region: moderate wasting Dorsal Hand: WNL Patellar Region: mild wasting Anterior Thigh Region: mild wasting Posterior Calf Region: mild wasting  Edema: none present  Pt meets criteria for moderate MALNUTRITION in the context of chronic illness as evidenced by mild-moderate wasting identifed on exam, PO poor reported at home, and 7% wt loss in <3 months.  Weight: Ht Readings from Last 1 Encounters:  04/02/13 5\' 6"  (1.676 m)    Weight: Wt Readings from Last 1 Encounters:  04/03/13 135 lb 5.8 oz (61.4 kg)    Ideal Body Weight:  142 lbs  % Ideal Body Weight: 95%  Wt Readings from Last 10 Encounters:  04/03/13 135 lb 5.8 oz (61.4 kg)  03/25/13 132 lb 0.6 oz (59.893 kg)  03/17/13 134 lb (60.782 kg)  02/12/13 139 lb (63.05 kg)  01/08/13 146 lb 1.3 oz (66.261 kg)  11/29/12 145 lb (65.772 kg)  11/29/12 145 lb (65.772 kg)  10/28/12 145 lb 6.4 oz (65.953 kg)  10/07/12 143 lb 1.9 oz (64.919 kg)  07/10/12 148 lb (67.132 kg)    Usual Body Weight: 145 lbs  % Usual Body Weight: 93%  BMI:  Body mass index is 21.86 kg/(m^2).  Estimated Nutritional Needs: Kcal: 1700-2000 Protein: 65-80g Fluid: >1.8 L/day  Skin: intact  Diet Order: General  EDUCATION NEEDS: -Education needs addressed   Intake/Output Summary (Last 24 hours) at 04/03/13 1557 Last data filed at 04/03/13 1500  Gross per 24 hour  Intake   3723 ml  Output   3050 ml  Net    673 ml    Last BM: 1/22  Labs:   Recent Labs Lab 04/02/13 0725 04/02/13 1900 04/03/13 0251  NA 143 143 143  K 4.5 3.8 3.6*  CL 103 103 103  CO2 28 26 28   BUN 38* 32* 24*  CREATININE 0.88 0.90 0.86  CALCIUM 8.7 8.6 8.2*  MG 1.8  --   --   PHOS 2.4  --   --   GLUCOSE 81 76 91    CBG (last 3)   Recent Labs  04/03/13 1241 04/03/13 1355 04/03/13 1358  GLUCAP 49* 28*  85    Scheduled Meds: . albuterol  2.5 mg Nebulization BID  . brimonidine  1 drop Both Eyes Q12H  . budesonide-formoterol  2 puff Inhalation BID  . cephALEXin  500 mg Oral Q8H  . diltiazem  120 mg Oral Daily  . diltiazem  10 mg Intravenous Once  . furosemide  40 mg Oral Daily  . latanoprost  1 drop Both Eyes QHS  . montelukast  10 mg Oral QHS  . potassium chloride  10 mEq Oral QPC breakfast  . predniSONE  50 mg Oral Q breakfast  . sodium chloride  3 mL Intravenous Q12H  . timolol  1 drop Both Eyes BID  . tiotropium  18 mcg Inhalation QAC breakfast    Continuous Infusions: . dextrose 75 mL/hr at 04/03/13 4628    Past Medical History  Diagnosis Date  . Anemia     Hemoglobin  11.6 in 04/2008->resolved   . Edema     Lower Extremity  . Tobacco abuse   . Glaucoma   . Allergic rhinitis   . Atrial fibrillation     Coumadin;asymptomatic bradycardia on telemetry in 12/2009  . CHF (congestive heart failure)     H/o CHF with preserved LV EF; pulmonary hypertension; normal EF in 03/2009  . Hypertension   . Shingles   . Shortness of breath   . COPD (chronic obstructive pulmonary disease)   . Asthma     Past Surgical History  Procedure Laterality Date  . Bilateral cataract surgery    . Herniorrhapy    . Tendon repair      right hand surgical procedure for a tendon repair  . Colonoscopy N/A 11/29/2012    Procedure: COLONOSCOPY;  Surgeon: Danie Binder, MD;  Location: [facility];  Service: Endoscopy;  Laterality: N/A;  1:00  . Esophagogastroduodenoscopy N/A 11/29/2012    Procedure: ESOPHAGOGASTRODUODENOSCOPY (EGD);  Surgeon: Danie Binder, MD;  Location: [facility];  Service: Endoscopy;  Laterality: N/A;    Brynda Greathouse, MS RD LDN Clinical Inpatient Dietitian Pager: [phone] Weekend/After hours pager: [phone]

## 2013-04-03 NOTE — Progress Notes (Signed)
Patient Demographics  Theodore [name], is a 73 y.o. male, DOB - April 21, 1940, QAS:[id number]  Admit date - 04/01/2013   Admitting Physician Robbie Lis, MD  Outpatient Primary MD for the patient is Tula Nakayama, MD  LOS - 2   Chief Complaint  Patient presents with  . Epistaxis        Assessment & Plan    1. Epistaxis - in the setting of supratherapeutic INR, with nasal packing no speech seems to have resolved. He had initially received FFP and vitamin K. INR stable along with H&H. Being followed by ENT. We'll wait for the recommendation to resume Coumadin.   Lab Results  Component Value Date   INR 1.31 04/03/2013   INR 1.54* 04/02/2013   INR 3.78* 04/01/2013      2. Persistent hypoglycemia. No previous history of diabetes mellitus or exposure to any hypoglycemic agents. Currently on D10 drip, will give glucagon 1 time, have ordered hypoglycemia workup. Will place him on low-dose prednisone , she will require outpatient endocrine followup post discharge.     3. Acute blood loss anemia due to epistaxis. He status post one unit packed RBC transfusion. H&H currently stable.     4. Atrial fibrillation. Goal is rate controlled, currently controlled on Cardizem which we will continue, holding Coumadin for #1 above.     5. Chronic diastolic CHF. He is currently compensated, continue home dose Lasix.     6. Hypertension. Stable on Cardizem and Lasix.     7. History of COPD. No wheezing on exam, no shortness of breath, as needed nebulizer treatments and oxygen.      Code Status: Full  Family Communication:   Disposition Plan: Home   Procedures     Consults  [facility]   Medications  Scheduled Meds: . albuterol  2.5 mg Nebulization BID  . brimonidine  1 drop Both  Eyes Q12H  . budesonide-formoterol  2 puff Inhalation BID  . cephALEXin  500 mg Oral Q8H  . furosemide  40 mg Oral Daily  . latanoprost  1 drop Both Eyes QHS  . montelukast  10 mg Oral QHS  . potassium chloride  10 mEq Oral QPC breakfast  . predniSONE  50 mg Oral Q breakfast  . sodium chloride  3 mL Intravenous Q12H  . timolol  1 drop Both Eyes BID  . tiotropium  18 mcg Inhalation QAC breakfast   Continuous Infusions: . dextrose 75 mL/hr at 04/03/13 0656   PRN Meds:.acetaminophen, acetaminophen, albuterol, HYDROcodone-acetaminophen, ondansetron (ZOFRAN) IV, ondansetron, temazepam, traMADol  DVT Prophylaxis    SCDs    Lab Results  Component Value Date   PLT 178 04/03/2013    Antibiotics     Anti-infectives   Start     Dose/Rate Route Frequency Ordered Stop   04/01/13 2330  cephALEXin (KEFLEX) capsule 500 mg     500 mg Oral 3 times per day 04/01/13 2321            Subjective:   Theodore [name] today has, No headache, No chest pain, No abdominal pain - No Nausea, No new weakness tingling or numbness, No Cough - SOB. Nosebleed almost resolved.   Objective:   Filed Vitals:   04/03/13 0400 04/03/13  0500 04/03/13 0801 04/03/13 0858  BP:   145/82   Pulse:   84   Temp: 98.2 F (36.8 C)  97.9 F (36.6 C)   TempSrc: Oral  Oral   Resp:   21   Height:      Weight:  61.4 kg (135 lb 5.8 oz)    SpO2:   100% 96%    Wt Readings from Last 3 Encounters:  04/03/13 61.4 kg (135 lb 5.8 oz)  03/25/13 59.893 kg (132 lb 0.6 oz)  03/17/13 60.782 kg (134 lb)     Intake/Output Summary (Last 24 hours) at 04/03/13 1047 Last data filed at 04/03/13 0600  Gross per 24 hour  Intake   3133 ml  Output   1900 ml  Net   1233 ml    Exam Awake Alert, Oriented X 3, No new F.N deficits, Normal affect [place].AT,PERRAL Supple Neck,No JVD, No cervical lymphadenopathy appriciated.  Symmetrical Chest wall movement, Good air movement bilaterally, CTAB RRR,No Gallops,Rubs or new Murmurs, No  Parasternal Heave +ve B.Sounds, Abd Soft, Non tender, No organomegaly appriciated, No rebound - guarding or rigidity. No Cyanosis, Clubbing or edema, No new Rash or bruise     Data Review   Micro Results Recent Results (from the past 240 hour(s))  MRSA PCR SCREENING     Status: None   Collection Time    04/02/13 12:57 AM      Result Value Range Status   MRSA by PCR NEGATIVE  NEGATIVE Final   Comment:            The GeneXpert MRSA Assay (FDA     approved for NASAL specimens     only), is one component of a     comprehensive MRSA colonization     surveillance program. It is not     intended to diagnose MRSA     infection nor to guide or     monitor treatment for     MRSA infections.    Radiology Reports Dg Chest 2 View  03/17/2013   CLINICAL DATA:  Shortness of breath. History of COPD and congestive heart failure.  EXAM: CHEST  2 VIEW  COMPARISON:  CT chest 03/18/2011 and PA and lateral chest 06/05/2012.  FINDINGS: There is cardiomegaly without edema. The lungs are emphysematous but clear. No pneumothorax or pleural effusion.  IMPRESSION: Cardiomegaly without edema.  Emphysema.   Electronically Signed   By: Inge Rise M.D.   On: 03/17/2013 19:48    CBC  Recent Labs Lab 04/01/13 0939 04/01/13 1901 04/02/13 0340 04/02/13 0725 04/02/13 1900 04/03/13 0251 04/03/13 0550 04/03/13 0940  WBC 2.4* 8.7 10.9* 8.6  --  9.1  --   --   HGB 11.9* 9.2* 8.3* 7.7* 9.7* 8.0* 8.8* 9.6*  HCT 35.9* 28.9* 25.3* 24.3* 30.5* 25.1* 26.6* 29.1*  PLT 109* 195 180 191  --  178  --   --   MCV 98.1 90.9 90.7 91.4  --  89.3  --   --   MCH 32.5 28.9 29.7 28.9  --  28.5  --   --   MCHC 33.1 31.8 32.8 31.7  --  31.9  --   --   RDW 14.2 13.8 13.9 14.0  --  15.4  --   --   LYMPHSABS  --  1.1  --  1.4  --   --   --   --   MONOABS  --  1.0  --  1.1*  --   --   --   --  EOSABS  --  0.1  --  0.1  --   --   --   --   BASOSABS  --  0.1  --  0.0  --   --   --   --     Chemistries   Recent  Labs Lab 04/01/13 1901 04/02/13 0340 04/02/13 0725 04/02/13 1900 04/03/13 0251  NA 142 142 143 143 143  K 3.8 4.4 4.5 3.8 3.6*  CL 101 101 103 103 103  CO2 30 27 28 26 28   GLUCOSE 83 89 81 76 91  BUN 29* 39* 38* 32* 24*  CREATININE 0.89 0.87 0.88 0.90 0.86  CALCIUM 8.4 8.8 8.7 8.6 8.2*  MG  --   --  1.8  --   --   AST  --  23 21  --   --   ALT  --  12 11  --   --   ALKPHOS  --  77 77  --   --   BILITOT  --  0.6 0.6  --   --    ------------------------------------------------------------------------------------------------------------------ estimated creatinine clearance is 66.4 ml/min (by C-G formula based on Cr of 0.86). ------------------------------------------------------------------------------------------------------------------  Recent Labs  04/02/13 1900  HGBA1C 5.5   ------------------------------------------------------------------------------------------------------------------ No results found for this basename: CHOL, HDL, LDLCALC, TRIG, CHOLHDL, LDLDIRECT,  in the last 72 hours ------------------------------------------------------------------------------------------------------------------  Recent Labs  04/02/13 0725  TSH 0.743   ------------------------------------------------------------------------------------------------------------------ No results found for this basename: VITAMINB12, FOLATE, FERRITIN, TIBC, IRON, RETICCTPCT,  in the last 72 hours  Coagulation profile  Recent Labs Lab 04/01/13 0939 04/01/13 1901 04/02/13 0725 04/03/13 0251  INR 3.66* 3.78* 1.54* 1.31    No results found for this basename: DDIMER,  in the last 72 hours  Cardiac Enzymes No results found for this basename: CK, CKMB, TROPONINI, MYOGLOBIN,  in the last 168 hours ------------------------------------------------------------------------------------------------------------------ No components found with this basename: POCBNP,      Time Spent in minutes    40   Latesia Norrington K M.D on 04/03/2013 at 10:47 AM  Between 7am to 7pm - Pager - [phone]  After 7pm go to [email or web address] - password [email or web address]  And look for the night coverage person covering for me after hours  [facility] Office  [phone]

## 2013-04-03 NOTE — ED Provider Notes (Signed)
I saw and evaluated the patient, reviewed the resident's note and I agree with the findings and plan.  EKG Interpretation    Date/Time:    Ventricular Rate:    PR Interval:    QRS Duration:   QT Interval:    QTC Calculation:   R Axis:     Text Interpretation:               Patient with a history of atrial fibrillation on Coumadin presents with persistent epistaxis. Patient is transferred from any pain hospital after bilateral Rhino Rocket placed. They're waiting emergency department with consultation with ENT, Dr. Redmond Baseman. Patient continued to have posterior bleeding despite Rhino Rocket in place. Significant drop in hemoglobin. Will reverse INR. [facility] to admit.  CRITICAL CARE Performed by: Lita Mains, Marquet Faircloth Total critical care time: 30 min Critical care time was exclusive of separately billable procedures and treating other patients. Critical care was necessary to treat or prevent imminent or life-threatening deterioration. Critical care was time spent personally by me on the following activities: development of treatment plan with patient and/or surrogate as well as nursing, discussions with consultants, evaluation of patient's response to treatment, examination of patient, obtaining history from patient or surrogate, ordering and performing treatments and interventions, ordering and review of laboratory studies, ordering and review of radiographic studies, pulse oximetry and re-evaluation of patient's condition.   Julianne Rice, MD 04/03/13 [phone]

## 2013-04-03 NOTE — Plan of Care (Signed)
Problem: Glucose Imbalance Goal: CBG's within defined parameters Outcome: Not Met (add Reason) CBGs consistently less than 70. MD notified; orders received and implemented.

## 2013-04-04 ENCOUNTER — Telehealth: Payer: Self-pay | Admitting: Family Medicine

## 2013-04-04 DIAGNOSIS — R040 Epistaxis: Secondary | ICD-10-CM

## 2013-04-04 DIAGNOSIS — E162 Hypoglycemia, unspecified: Secondary | ICD-10-CM

## 2013-04-04 LAB — GLUCOSE, CAPILLARY
Glucose-Capillary: 139 mg/dL — ABNORMAL HIGH (ref 70–99)
Glucose-Capillary: 126 mg/dL — ABNORMAL HIGH (ref 70–99)

## 2013-04-04 MED ORDER — MAGNESIUM SULFATE IN D5W 10-5 MG/ML-% IV SOLN
1.0000 g | Freq: Once | INTRAVENOUS | Status: AC
  Administered 2013-04-04: 1 g via INTRAVENOUS
  Filled 2013-04-04: qty 100

## 2013-04-04 MED ORDER — PREDNISONE 20 MG PO TABS: 20.0000 mg | ORAL_TABLET | Freq: Every day | ORAL | Status: DC

## 2013-04-04 MED ORDER — PREDNISONE 20 MG PO TABS
30.0000 mg | ORAL_TABLET | Freq: Every day | ORAL | Status: DC
  Administered 2013-04-04: 30 mg via ORAL
  Filled 2013-04-04 (×2): qty 1

## 2013-04-04 MED ORDER — METOPROLOL TARTRATE 50 MG PO TABS
50.0000 mg | ORAL_TABLET | Freq: Two times a day (BID) | ORAL | Status: DC
  Administered 2013-04-04: 50 mg via ORAL
  Filled 2013-04-04: qty 1
  Filled 2013-04-04: qty 2
  Filled 2013-04-04: qty 1

## 2013-04-04 MED ORDER — POTASSIUM CHLORIDE CRYS ER 20 MEQ PO TBCR
40.0000 meq | EXTENDED_RELEASE_TABLET | Freq: Once | ORAL | Status: AC
  Administered 2013-04-04: 40 meq via ORAL
  Filled 2013-04-04: qty 2

## 2013-04-04 MED ORDER — METOPROLOL TARTRATE 50 MG PO TABS: 50.0000 mg | ORAL_TABLET | Freq: Two times a day (BID) | ORAL | Status: DC

## 2013-04-04 MED ORDER — CEPHALEXIN 500 MG PO CAPS: 500.0000 mg | ORAL_CAPSULE | Freq: Three times a day (TID) | ORAL | Status: DC

## 2013-04-04 NOTE — Telephone Encounter (Signed)
Alisa aware and she is going to get with [facility] and let him know the plan. She is going to call Dr Redmond Baseman office and try to get him in there Monday or Tuesday. Depending on his appt there, he also has appt here on Monday afternoon. She is also going to get his STAT labs done on Monday am before his appt here. She is also aware that he is being referred to [facility]

## 2013-04-04 NOTE — Telephone Encounter (Signed)
Pls contact [name] , and pt, per d/i he nEEDS tom see ENT Dr Redmond Baseman on Monday to remove nasal packing [name]) also supposed to see me this Monday, mainly for labs , and coordination of care, which we can let him get done on Monday, cBC and chem 7 stat, before 2pm if possible, for sure then f/u with me later on Monday or on  Tuesday, He DOES NEED to see ENT on Monday.  He also needs to have a new pt eval with Dr Dorris Fetch evaluation of low blood sugars. So with Theodore [name]'s help these visits need to happen please. I am entering endo referral and please give him appt info for this Ov, sched the appt pls, I think Theodore [name] can help with verifying appt info for ENT and endo   Any questions/concerns pls ask  In summary pls relay the info on appts needed, and give info for the one here Sched his appt here Order and send in stat cbc and chem 7 for Monday next week before 2pm if possible

## 2013-04-04 NOTE — Progress Notes (Signed)
Reviewed discharge instructions with patient. Patient lives at home with wife but manages all his meds.Patient verbally understood the instructions and is awaiting his ride. Will continue to monitor.  Emeka Lindner, Mervin Kung RN

## 2013-04-04 NOTE — Discharge Summary (Signed)
Theodore [name], is a 73 y.o. male  DOB 1940-12-28  MRN [id number].  Admission date:  04/01/2013  Admitting Physician  Robbie Lis, MD  Discharge Date:  04/04/2013   Primary MD  Tula Nakayama, MD  Recommendations for primary care physician for things to follow:   Repeat CBC BMP in 3 days, Follow clinically, make sure patient follows with recommended sub specialists   Admission Diagnosis  Atrial fibrillation [427.31] Acute blood loss anemia [285.1] Coagulopathy [286.9] Anterior epistaxis [784.7]  Discharge Diagnosis   As above  Principal Problem:   Epistaxis Active Problems:   HYPERTENSION   Atrial fibrillation   Acute blood loss anemia   COPD (chronic obstructive pulmonary disease)      Past Medical History  Diagnosis Date  . Anemia     Hemoglobin 11.6 in 04/2008->resolved   . Edema     Lower Extremity  . Tobacco abuse   . Glaucoma   . Allergic rhinitis   . Atrial fibrillation     Coumadin;asymptomatic bradycardia on telemetry in 12/2009  . CHF (congestive heart failure)     H/o CHF with preserved LV EF; pulmonary hypertension; normal EF in 03/2009  . Hypertension   . Shingles   . Shortness of breath   . COPD (chronic obstructive pulmonary disease)   . Asthma     Past Surgical History  Procedure Laterality Date  . Bilateral cataract surgery    . Herniorrhapy    . Tendon repair      right hand surgical procedure for a tendon repair  . Colonoscopy N/A 11/29/2012    Procedure: COLONOSCOPY;  Surgeon: Danie Binder, MD;  Location: [facility];  Service: Endoscopy;  Laterality: N/A;  1:00  . Esophagogastroduodenoscopy N/A 11/29/2012    Procedure: ESOPHAGOGASTRODUODENOSCOPY (EGD);  Surgeon: Danie Binder, MD;  Location: [facility];  Service: Endoscopy;  Laterality: N/A;     Discharge Condition:  stable    Follow UP      Follow-up Information   Follow up with BATES, DWIGHT, MD In 3 days. (get packing removed)    Specialty:  Otolaryngology   Contact information:   [address] [place] [address] [phone]       Follow up with Tula Nakayama, MD. Schedule an appointment as soon as possible for a visit in 3 days.   Specialty:  Family Medicine   Contact information:   [address] [place] [address] [phone]       Follow up with NIDA,GEBRESELASSIE, MD. Schedule an appointment as soon as possible for a visit in 3 days.   Specialty:  Endocrinology   Contact information:   [address] [place] [address] [phone]       Follow up with Rozann Lesches, MD. Schedule an appointment as soon as possible for a visit in 1 week.   Specialty:  Cardiology   Contact information:   [place] [address] [phone]  Consults obtained - ENT [facility]   Discharge Medications      Medication List    STOP taking these medications       diltiazem 120 MG 24 hr capsule  Commonly known as:  CARDIZEM CD     warfarin 4 MG tablet  Commonly known as:  COUMADIN      TAKE these medications       albuterol (2.5 MG/3ML) 0.083% nebulizer solution  Commonly known as:  PROVENTIL  Take 2.5 mg by nebulization 2 (two) times daily.     PROAIR HFA 108 (90 BASE) MCG/ACT inhaler  Generic drug:  albuterol  Inhale 2 puffs into the lungs every 6 (six) hours as needed for wheezing or shortness of breath.     budesonide-formoterol 160-4.5 MCG/ACT inhaler  Commonly known as:  SYMBICORT  Inhale 2 puffs into the lungs 2 (two) times daily.     cephALEXin 500 MG capsule  Commonly known as:  KEFLEX  Take 1 capsule (500 mg total) by mouth every 8 (eight) hours.     COMBIGAN 0.2-0.5 % ophthalmic solution  Generic drug:  brimonidine-timolol  Place 1 drop into both eyes every 12 (twelve) hours.     furosemide  40 MG tablet  Commonly known as:  LASIX  Take 1 tablet (40 mg total) by mouth daily.     metoprolol 50 MG tablet  Commonly known as:  LOPRESSOR  Take 1 tablet (50 mg total) by mouth 2 (two) times daily.     montelukast 10 MG tablet  Commonly known as:  SINGULAIR  Take 10 mg by mouth at bedtime.     potassium chloride 10 MEQ tablet  Commonly known as:  K-DUR  Take 10 mEq by mouth daily after breakfast.     predniSONE 20 MG tablet  Commonly known as:  DELTASONE  Take 1 tablet (20 mg total) by mouth daily with breakfast.     temazepam 15 MG capsule  Commonly known as:  RESTORIL  Take 1 capsule (15 mg total) by mouth at bedtime as needed for sleep.     tiotropium 18 MCG inhalation capsule  Commonly known as:  SPIRIVA  Place 18 mcg into inhaler and inhale daily.     traMADol 50 MG tablet  Commonly known as:  ULTRAM  Take 1 tablet (50 mg total) by mouth 2 (two) times daily as needed for pain.     travoprost (benzalkonium) 0.004 % ophthalmic solution  Commonly known as:  TRAVATAN  Place 1 drop into both eyes at bedtime.         Diet and Activity recommendation: See Discharge Instructions below   Discharge Instructions     Follow with Primary MD Tula Nakayama, MD in 3 days   Get CBC, CMP, checked 3 days by Primary MD and again as instructed by your Primary MD.    Activity: As tolerated with Full fall precautions use walker/cane & assistance as needed   Disposition Home    Diet:  Heart Healthy,  Fluid restriction 1.8 lit/day, Aspiration precautions.  For Heart failure patients - Check your Weight same time everyday, if you gain over 2 pounds, or you develop in leg swelling, experience more shortness of breath or chest pain, call your Primary MD immediately. Follow Cardiac Low Salt Diet and 1.8 lit/day fluid restriction.   On your next visit with her primary care physician please Get Medicines reviewed and adjusted.  Please request your Prim.MD to go over all  Hospital Tests  and Procedure/Radiological results at the follow up, please get all Hospital records sent to your Prim MD by signing hospital release before you go home.   If you experience worsening of your admission symptoms, develop shortness of breath, life threatening emergency, suicidal or homicidal thoughts you must seek medical attention immediately by calling 911 or calling your MD immediately  if symptoms less severe.  You Must read complete instructions/literature along with all the possible adverse reactions/side effects for all the Medicines you take and that have been prescribed to you. Take any new Medicines after you have completely understood and accpet all the possible adverse reactions/side effects.   Do not drive and provide baby sitting services if your were admitted for syncope or siezures until you have seen by Primary MD or a Neurologist and advised to do so again.  Do not drive when taking Pain medications.    Do not take more than prescribed Pain, Sleep and Anxiety Medications  Special Instructions: If you have smoked or chewed Tobacco  in the last 2 yrs please stop smoking, stop any regular Alcohol  and or any Recreational drug use.  Wear Seat belts while driving.   Please note  You were cared for by a hospitalist during your hospital stay. If you have any questions about your discharge medications or the care you received while you were in the hospital after you are discharged, you can call the unit and asked to speak with the hospitalist on call if the hospitalist that took care of you is not available. Once you are discharged, your primary care physician will handle any further medical issues. Please note that NO REFILLS for any discharge medications will be authorized once you are discharged, as it is imperative that you return to your primary care physician (or establish a relationship with a primary care physician if you do not have one) for your aftercare needs  so that they can reassess your need for medications and monitor your lab values.    Major procedures and Radiology Reports - PLEASE review detailed and final reports for all details, in brief -      Dg Chest 2 View  03/17/2013   CLINICAL DATA:  Shortness of breath. History of COPD and congestive heart failure.  EXAM: CHEST  2 VIEW  COMPARISON:  CT chest 03/18/2011 and PA and lateral chest 06/05/2012.  FINDINGS: There is cardiomegaly without edema. The lungs are emphysematous but clear. No pneumothorax or pleural effusion.  IMPRESSION: Cardiomegaly without edema.  Emphysema.   Electronically Signed   By: Inge Rise M.D.   On: 03/17/2013 19:48    Micro Results      Recent Results (from the past 240 hour(s))  MRSA PCR SCREENING     Status: None   Collection Time    04/02/13 12:57 AM      Result Value Range Status   MRSA by PCR NEGATIVE  NEGATIVE Final   Comment:            The GeneXpert MRSA Assay (FDA     approved for NASAL specimens     only), is one component of a     comprehensive MRSA colonization     surveillance program. It is not     intended to diagnose MRSA     infection nor to guide or     monitor treatment for     MRSA infections.     History of present illness and  Hospital Course:  Kindly see H&P for history of present illness and admission details, please review complete Labs, Consult reports and Test reports for all details in brief Theodore [name], is a 73 y.o. male, patient with history of A. fibrillation on Coumadin, hypertension, COPD, was admitted to the hospital with supratherapeutic INR causing nosebleed in acute blood loss related anemia. He required vitamin K, FFP and one unit of packed RBC transfusion, right-sided anterior and posterior nasal packing by ENT physician Dr. Redmond Baseman, after which his nosebleeds have resolved and H&H has remained stable.  Discussed his case with ENT physician Dr. Redmond Baseman wants to see him in the office on Monday to remove  packing, Coumadin will be held till she follows with Dr. Redmond Baseman thereafter will have him follow with his PCP and cardiologist to decide on long-term anticoagulation. Last INR is 1.3. Currently will not place him on aspirin, patient has been explained all the risks and benefits in detail she is agreeable to this plan.     Of note during this admission patient was noted to have Persistent hypoglycemia. His A1c was 5.9, he is no  previous history of diabetes mellitus or exposure to any hypoglycemic agents. His random cortisol, TSH, insulin levels, C-peptide were all stable. He has been placed on prednisone with stable glucose levels, we'll have him follow with local endocrinologist post discharge.    For his history of Atrial fibrillation. Goal is rate controlled, place him on beta blocker instead of calcium channel blocker twice a day as he had few runs of asymptomatic V. tach of about 5-6 beats in the setting of mild hypokalemia which was replaced, last EF is greater than 50% one year ago, holding Coumadin for #1 above. His EKG now stable except for atrial fibrillation, no more PVCs or shortness of V. tach, he completely remained stable and asymptomatic throughout this episode. Will have him follow with local cardiologist post discharge, will be placed on beta blocker for now.   He was clinically compensated Chronic diastolic CHF -  continue home dose Lasix and potassium   Hypertension. Stable on Lopressor and Lasix.    History of COPD. No wheezing on exam, no shortness of breath, as needed nebulizer treatments and oxygen.        Today   Subjective:   Theodore [name] today has no headache,no chest abdominal pain,no new weakness tingling or numbness, feels much better wants to go home today.   Objective:   Blood pressure 129/86, pulse 77, temperature 97.8 F (36.6 C), temperature source Oral, resp. rate 20, height 5\' 6"  (1.676 m), weight 62.551 kg (137 lb 14.4 oz), SpO2  93.00%.   Intake/Output Summary (Last 24 hours) at 04/04/13 0923 Last data filed at 04/04/13 0800  Gross per 24 hour  Intake   2190 ml  Output   1980 ml  Net    210 ml    Exam Awake Alert, Oriented *3, No new F.N deficits, Normal affect [place].AT,PERRAL Supple Neck,No JVD, No cervical lymphadenopathy appriciated.  Symmetrical Chest wall movement, Good air movement bilaterally, CTAB RRR,No Gallops,Rubs or new Murmurs, No Parasternal Heave +ve B.Sounds, Abd Soft, Non tender, No organomegaly appriciated, No rebound -guarding or rigidity. No Cyanosis, Clubbing or edema, No new Rash or bruise  Data Review   CBC w Diff: Lab Results  Component Value Date   WBC 9.1 04/03/2013   HGB 9.6* 04/03/2013   HCT 29.1* 04/03/2013   PLT 178 04/03/2013   LYMPHOPCT 17 04/02/2013   MONOPCT 13* 04/02/2013  EOSPCT 1 04/02/2013   BASOPCT 0 04/02/2013    CMP: Lab Results  Component Value Date   NA 143 04/03/2013   K 3.6* 04/03/2013   CL 103 04/03/2013   CO2 28 04/03/2013   BUN 24* 04/03/2013   CREATININE 0.86 04/03/2013   CREATININE 0.84 10/07/2012   PROT 6.2 04/02/2013   ALBUMIN 2.7* 04/02/2013   BILITOT 0.6 04/02/2013   ALKPHOS 77 04/02/2013   AST 21 04/02/2013   ALT 11 04/02/2013  .   Total Time in preparing paper work, data evaluation and todays exam - 35 minutes  Thurnell Lose M.D on 04/04/2013 at 9:23 AM  [facility] Office  [phone]

## 2013-04-04 NOTE — Telephone Encounter (Signed)
Called Alisa and left message

## 2013-04-04 NOTE — Progress Notes (Signed)
Md aware of 5 beat run of V-tach. Patient asymptomatic at this time. Will continue to monitor.  Theodore [name], Mervin Kung RN

## 2013-04-04 NOTE — Discharge Instructions (Signed)
Follow with Primary MD Tula Nakayama, MD in 3 days   Get CBC, CMP, checked 3 days by Primary MD and again as instructed by your Primary MD.    Activity: As tolerated with Full fall precautions use walker/cane & assistance as needed   Disposition Home    Diet:  Heart Healthy,  Fluid restriction 1.8 lit/day, Aspiration precautions.  For Heart failure patients - Check your Weight same time everyday, if you gain over 2 pounds, or you develop in leg swelling, experience more shortness of breath or chest pain, call your Primary MD immediately. Follow Cardiac Low Salt Diet and 1.8 lit/day fluid restriction.   On your next visit with her primary care physician please Get Medicines reviewed and adjusted.  Please request your Prim.MD to go over all Hospital Tests and Procedure/Radiological results at the follow up, please get all Hospital records sent to your Prim MD by signing hospital release before you go home.   If you experience worsening of your admission symptoms, develop shortness of breath, life threatening emergency, suicidal or homicidal thoughts you must seek medical attention immediately by calling 911 or calling your MD immediately  if symptoms less severe.  You Must read complete instructions/literature along with all the possible adverse reactions/side effects for all the Medicines you take and that have been prescribed to you. Take any new Medicines after you have completely understood and accpet all the possible adverse reactions/side effects.   Do not drive and provide baby sitting services if your were admitted for syncope or siezures until you have seen by Primary MD or a Neurologist and advised to do so again.  Do not drive when taking Pain medications.    Do not take more than prescribed Pain, Sleep and Anxiety Medications  Special Instructions: If you have smoked or chewed Tobacco  in the last 2 yrs please stop smoking, stop any regular Alcohol  and or any  Recreational drug use.  Wear Seat belts while driving.   Please note  You were cared for by a hospitalist during your hospital stay. If you have any questions about your discharge medications or the care you received while you were in the hospital after you are discharged, you can call the unit and asked to speak with the hospitalist on call if the hospitalist that took care of you is not available. Once you are discharged, your primary care physician will handle any further medical issues. Please note that NO REFILLS for any discharge medications will be authorized once you are discharged, as it is imperative that you return to your primary care physician (or establish a relationship with a primary care physician if you do not have one) for your aftercare needs so that they can reassess your need for medications and monitor your lab values.

## 2013-04-04 NOTE — Addendum Note (Signed)
Addended by: Eual Fines on: 04/04/2013 04:27 PM   Modules accepted: Orders

## 2013-04-07 ENCOUNTER — Ambulatory Visit (INDEPENDENT_AMBULATORY_CARE_PROVIDER_SITE_OTHER): Payer: Medicare Other | Admitting: Family Medicine

## 2013-04-07 ENCOUNTER — Encounter: Payer: Self-pay | Admitting: Family Medicine

## 2013-04-07 VITALS — BP 140/68 | HR 60 | Resp 20 | Ht 67.5 in | Wt 139.0 lb

## 2013-04-07 DIAGNOSIS — I5030 Unspecified diastolic (congestive) heart failure: Secondary | ICD-10-CM

## 2013-04-07 DIAGNOSIS — F411 Generalized anxiety disorder: Secondary | ICD-10-CM

## 2013-04-07 DIAGNOSIS — F419 Anxiety disorder, unspecified: Secondary | ICD-10-CM

## 2013-04-07 DIAGNOSIS — I10 Essential (primary) hypertension: Secondary | ICD-10-CM

## 2013-04-07 DIAGNOSIS — F489 Nonpsychotic mental disorder, unspecified: Secondary | ICD-10-CM

## 2013-04-07 DIAGNOSIS — Z720 Tobacco use: Secondary | ICD-10-CM

## 2013-04-07 DIAGNOSIS — J449 Chronic obstructive pulmonary disease, unspecified: Secondary | ICD-10-CM

## 2013-04-07 DIAGNOSIS — F5105 Insomnia due to other mental disorder: Secondary | ICD-10-CM

## 2013-04-07 DIAGNOSIS — H409 Unspecified glaucoma: Secondary | ICD-10-CM

## 2013-04-07 DIAGNOSIS — F17200 Nicotine dependence, unspecified, uncomplicated: Secondary | ICD-10-CM

## 2013-04-07 DIAGNOSIS — I509 Heart failure, unspecified: Secondary | ICD-10-CM

## 2013-04-07 DIAGNOSIS — R040 Epistaxis: Secondary | ICD-10-CM

## 2013-04-07 NOTE — Patient Instructions (Signed)
F/u in  7 weeks, call if you need me before  You need to take prednisone once every day  You need the new blood pressure pill for your heart TWO times daily 6am and 6PM   You need the antibiotic 6am, 2pm and 10pm, thyis will soon end  You need to keep appts with endocrinology and with cardiology you are being referred  Do nOT start back on diltiazem  or prednisone until the heart Doc tells you to do so

## 2013-04-08 ENCOUNTER — Telehealth: Payer: Self-pay | Admitting: *Deleted

## 2013-04-08 NOTE — Telephone Encounter (Signed)
[name] with [facility] advised that she just escorted the pt to his ENT apt with Dr Melida Quitter today and notes the pt was advised to STOP his coumadin at recent ED visit per nose bleed and complications, pt has not taken coumadin in 2 weeks at this point and noted his INR yesterday of 1.1 per PCP office, however Dr Redmond Baseman wanted to advise the pt packing is removed and looks good and would advise the pt needs to remain off his coumadin until the beginning of february if possible, pt also notes he will not have enough for his co-pay to check his coumadin if he needs to come in prior to the first of the month, please advise AG RN with [facility] at [phone] of pt apt per notes pt unable to comprehend instructions at times

## 2013-04-08 NOTE — Telephone Encounter (Signed)
This nurse spoke to Edrick Oh RN whom advised AG RN was contacted with the next steps for this pt to be informed by [employer]

## 2013-04-09 ENCOUNTER — Encounter (INDEPENDENT_AMBULATORY_CARE_PROVIDER_SITE_OTHER): Payer: Medicare Other

## 2013-04-09 DIAGNOSIS — I4891 Unspecified atrial fibrillation: Secondary | ICD-10-CM

## 2013-04-09 DIAGNOSIS — Z7901 Long term (current) use of anticoagulants: Secondary | ICD-10-CM

## 2013-04-10 LAB — SULFONYLUREA HYPOGLYCEMICS PANEL, SERUM
Acetohexamide: NOT DETECTED ng/mL
CHLORPROPAMIDE: NOT DETECTED ng/mL
GLIPIZIDE: NOT DETECTED ng/mL
GLYBURIDE: NOT DETECTED ng/mL
Glimepiride: NOT DETECTED ng/mL
NATEGLINIDE: NOT DETECTED ng/mL
Repaglinide: NOT DETECTED ng/mL
Tolazamide: NOT DETECTED ng/mL
Tolbutamide: NOT DETECTED ng/mL

## 2013-04-11 ENCOUNTER — Encounter: Payer: Self-pay | Admitting: Adult Health

## 2013-04-11 ENCOUNTER — Ambulatory Visit (INDEPENDENT_AMBULATORY_CARE_PROVIDER_SITE_OTHER): Payer: Medicare Other | Admitting: Adult Health

## 2013-04-11 VITALS — BP 120/72 | HR 88 | Ht 66.0 in | Wt 143.0 lb

## 2013-04-11 DIAGNOSIS — I509 Heart failure, unspecified: Secondary | ICD-10-CM

## 2013-04-11 DIAGNOSIS — I10 Essential (primary) hypertension: Secondary | ICD-10-CM

## 2013-04-11 DIAGNOSIS — Z720 Tobacco use: Secondary | ICD-10-CM

## 2013-04-11 DIAGNOSIS — F17200 Nicotine dependence, unspecified, uncomplicated: Secondary | ICD-10-CM

## 2013-04-11 DIAGNOSIS — I4891 Unspecified atrial fibrillation: Secondary | ICD-10-CM

## 2013-04-11 DIAGNOSIS — I5030 Unspecified diastolic (congestive) heart failure: Secondary | ICD-10-CM

## 2013-04-11 NOTE — Assessment & Plan Note (Signed)
He continues to smoke. I have counseled him on this with diagnosis of COPD. He verbalizes understanding.

## 2013-04-11 NOTE — Patient Instructions (Signed)
Your physician recommends that you schedule a follow-up appointment in: 6 months with Dr Virgina Jock will receive a reminder letter two months in advance reminding you to call and schedule your appointment. If you don't receive this letter, please contact our office.  Your physician recommends that you continue on your current medications as directed. Please refer to the Current Medication list given to you today.

## 2013-04-11 NOTE — Progress Notes (Signed)
HPI: [name] is a 73 year old patient to be est. with Dr. Pennelope Bracken whereupon for ongoing assessment and management of chronic atrial fibrillation, hypertension, with a history of COPD. Unfortunately the patient was admitted to the hospital in the setting of acute blood loss anemia, anterior epistaxis, with coagulopathy. During admission the patient received blood transfusions. Coumadin was discontinued. The patient's main complaint centers around her COPD symptoms with dyspnea on exertion and frequent coughing. He uses his inhalers frequently. He denies any active coughing or signs of infection or fever. He has not taken his diuretic today and is found to have some lower extremity edema.  Allergies  Allergen Reactions  . Aspirin     On coumadin    Current Outpatient Prescriptions  Medication Sig Dispense Refill  . albuterol (PROAIR HFA) 108 (90 BASE) MCG/ACT inhaler Inhale 2 puffs into the lungs every 6 (six) hours as needed for wheezing or shortness of breath.      Marland Kitchen albuterol (PROVENTIL) (2.5 MG/3ML) 0.083% nebulizer solution Take 2.5 mg by nebulization 2 (two) times daily.      . brimonidine-timolol (COMBIGAN) 0.2-0.5 % ophthalmic solution Place 1 drop into both eyes every 12 (twelve) hours.        . budesonide-formoterol (SYMBICORT) 160-4.5 MCG/ACT inhaler Inhale 2 puffs into the lungs 2 (two) times daily.      . cephALEXin (KEFLEX) 500 MG capsule Take 1 capsule (500 mg total) by mouth every 8 (eight) hours.  18 capsule  0  . furosemide (LASIX) 40 MG tablet Take 1 tablet (40 mg total) by mouth daily.  60 tablet  6  . metoprolol (LOPRESSOR) 50 MG tablet Take 1 tablet (50 mg total) by mouth 2 (two) times daily.  60 tablet  1  . montelukast (SINGULAIR) 10 MG tablet Take 10 mg by mouth at bedtime.      . potassium chloride (K-DUR) 10 MEQ tablet Take 10 mEq by mouth daily after breakfast.      . predniSONE (DELTASONE) 20 MG tablet Take 1 tablet (20 mg total) by mouth daily with  breakfast.  30 tablet  0  . temazepam (RESTORIL) 15 MG capsule Take 1 capsule (15 mg total) by mouth at bedtime as needed for sleep.  30 capsule  1  . tiotropium (SPIRIVA) 18 MCG inhalation capsule Place 18 mcg into inhaler and inhale daily.      . traMADol (ULTRAM) 50 MG tablet Take 1 tablet (50 mg total) by mouth 2 (two) times daily as needed for pain.  60 tablet  3  . travoprost, benzalkonium, (TRAVATAN Z) 0.004 % ophthalmic solution Place 1 drop into both eyes at bedtime.        No current facility-administered medications for this visit.    Past Medical History  Diagnosis Date  . Anemia     Hemoglobin 11.6 in 04/2008->resolved   . Edema     Lower Extremity  . Tobacco abuse   . Glaucoma   . Allergic rhinitis   . Atrial fibrillation     Coumadin;asymptomatic bradycardia on telemetry in 12/2009  . CHF (congestive heart failure)     H/o CHF with preserved LV EF; pulmonary hypertension; normal EF in 03/2009  . Hypertension   . Shingles   . Shortness of breath   . COPD (chronic obstructive pulmonary disease)   . Asthma     Past Surgical History  Procedure Laterality Date  . Bilateral cataract surgery    . Herniorrhapy    .  Tendon repair      right hand surgical procedure for a tendon repair  . Colonoscopy N/A 11/29/2012    Procedure: COLONOSCOPY;  Surgeon: Danie Binder, MD;  Location: [facility];  Service: Endoscopy;  Laterality: N/A;  1:00  . Esophagogastroduodenoscopy N/A 11/29/2012    Procedure: ESOPHAGOGASTRODUODENOSCOPY (EGD);  Surgeon: Danie Binder, MD;  Location: [facility];  Service: Endoscopy;  Laterality: N/A;    ROS: Review of systems complete and found to be negative unless listed above  PHYSICAL EXAM BP 120/72  Pulse 88  Ht 5\' 6"  (1.676 m)  Wt 143 lb (64.864 kg)  BMI 23.09 kg/m2 General: Well developed, well nourished, in no acute distress Head: Eyes PERRLA, No xanthomas.   Normal cephalic and atramatic  Lungs: Bilateral crackles, without  wheezes. Heart: HIRR S1 S2, without MRG.  Pulses are 2+ & equal.            No carotid bruit. No JVD.  No abdominal bruits. No femoral bruits. Abdomen: Bowel sounds are positive, abdomen soft and non-tender without masses or                  Hernia's noted. Msk:  Back normal, normal gait. Normal strength and tone for age. Extremities: No clubbing, cyanosis or edema.  DP +1 Neuro: Alert and oriented X 3. Psych:  Good affect, responds appropriately    ASSESSMENT AND PLAN

## 2013-04-11 NOTE — Assessment & Plan Note (Addendum)
His HR remains stable. Due to bleeding and need for blood transfusion, would not restart coumadin at this time. CHADs Score of 2 for hypertension and CHF. He has appt with ENT for re-evaluation. No more bleeding will consider restarting coumadin or novel agent. He is very low income, and therefore coumadin may be most affordable. He will follow with Dr. Bronson Ing for ongoing management.

## 2013-04-11 NOTE — Assessment & Plan Note (Signed)
Blood pressure is well controlled currently. He is to have labs drawn this week for evaluation of CBC and BMET. He will continue metoprolol only.

## 2013-04-11 NOTE — Assessment & Plan Note (Signed)
He has some LE on exam, but has not taken his diuretic today. He is educated on low sodium diet. He verbalizes understanding.

## 2013-04-11 NOTE — Progress Notes (Deleted)
Name: Theodore [name]    DOB: [birth year]  Age: 73 y.o.  MR#: [id number]       PCP:  Tula Nakayama, MD      Insurance: Payor: Onnie Boer MEDICARE / Plan: AARP MEDICARE COMPLETE / Product Type: *No Product type* /   CC:   No chief complaint on file.   VS Filed Vitals:   04/11/13 1416  BP: 120/72  Pulse: 88  Height: 5\' 6"  (1.676 m)  Weight: 143 lb (64.864 kg)    Weights Current Weight  04/11/13 143 lb (64.864 kg)  04/07/13 139 lb 0.6 oz (63.068 kg)  04/04/13 137 lb 14.4 oz (62.551 kg)    Blood Pressure  BP Readings from Last 3 Encounters:  04/11/13 120/72  04/07/13 140/68  04/04/13 129/86     Admit date:  (Not on file) Last encounter with RMR:  Visit date not found   Allergy Aspirin  Current Outpatient Prescriptions  Medication Sig Dispense Refill  . albuterol (PROAIR HFA) 108 (90 BASE) MCG/ACT inhaler Inhale 2 puffs into the lungs every 6 (six) hours as needed for wheezing or shortness of breath.      Marland Kitchen albuterol (PROVENTIL) (2.5 MG/3ML) 0.083% nebulizer solution Take 2.5 mg by nebulization 2 (two) times daily.      . brimonidine-timolol (COMBIGAN) 0.2-0.5 % ophthalmic solution Place 1 drop into both eyes every 12 (twelve) hours.        . budesonide-formoterol (SYMBICORT) 160-4.5 MCG/ACT inhaler Inhale 2 puffs into the lungs 2 (two) times daily.      . cephALEXin (KEFLEX) 500 MG capsule Take 1 capsule (500 mg total) by mouth every 8 (eight) hours.  18 capsule  0  . furosemide (LASIX) 40 MG tablet Take 1 tablet (40 mg total) by mouth daily.  60 tablet  6  . metoprolol (LOPRESSOR) 50 MG tablet Take 1 tablet (50 mg total) by mouth 2 (two) times daily.  60 tablet  1  . montelukast (SINGULAIR) 10 MG tablet Take 10 mg by mouth at bedtime.      . potassium chloride (K-DUR) 10 MEQ tablet Take 10 mEq by mouth daily after breakfast.      . predniSONE (DELTASONE) 20 MG tablet Take 1 tablet (20 mg total) by mouth daily with breakfast.  30 tablet  0  . temazepam (RESTORIL) 15  MG capsule Take 1 capsule (15 mg total) by mouth at bedtime as needed for sleep.  30 capsule  1  . tiotropium (SPIRIVA) 18 MCG inhalation capsule Place 18 mcg into inhaler and inhale daily.      . traMADol (ULTRAM) 50 MG tablet Take 1 tablet (50 mg total) by mouth 2 (two) times daily as needed for pain.  60 tablet  3  . travoprost, benzalkonium, (TRAVATAN Z) 0.004 % ophthalmic solution Place 1 drop into both eyes at bedtime.        No current facility-administered medications for this visit.    Discontinued Meds:   There are no discontinued medications.  Patient Active Problem List   Diagnosis Date Noted  . COPD (chronic obstructive pulmonary disease) 04/02/2013  . Epistaxis 04/01/2013  . Acute blood loss anemia 04/01/2013  . 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 Tobacco abuse disorder 12/24/2010  . Encounter for long-term (current) use of other medications 05/15/2010  . HYPERTENSION 02/13/2006  . Atrial fibrillation 02/13/2006    LABS    Component Value Date/Time   NA 143 04/03/2013 0251  NA 143 04/02/2013 1900   NA 143 04/02/2013 0725   K 3.6* 04/03/2013 0251   K 3.8 04/02/2013 1900   K 4.5 04/02/2013 0725   CL 103 04/03/2013 0251   CL 103 04/02/2013 1900   CL 103 04/02/2013 0725   CO2 28 04/03/2013 0251   CO2 26 04/02/2013 1900   CO2 28 04/02/2013 0725   GLUCOSE 91 04/03/2013 0251   GLUCOSE 76 04/02/2013 1900   GLUCOSE 81 04/02/2013 0725   BUN 24* 04/03/2013 0251   BUN 32* 04/02/2013 1900   BUN 38* 04/02/2013 0725   CREATININE 0.86 04/03/2013 0251   CREATININE 0.90 04/02/2013 1900   CREATININE 0.88 04/02/2013 0725   CREATININE 0.84 10/07/2012 1720   CREATININE 1.03 04/11/2012 1405   CALCIUM 8.2* 04/03/2013 0251   CALCIUM 8.6 04/02/2013 1900   CALCIUM 8.7 04/02/2013 0725   GFRNONAA 84* 04/03/2013 0251   GFRNONAA 82* 04/02/2013 1900   GFRNONAA 83* 04/02/2013 0725   GFRAA >90 04/03/2013 0251   GFRAA >90 04/02/2013 1900   GFRAA >90 04/02/2013 0725    CMP     Component Value Date/Time   NA 143 04/03/2013 0251   K 3.6* 04/03/2013 0251   CL 103 04/03/2013 0251   CO2 28 04/03/2013 0251   GLUCOSE 91 04/03/2013 0251   BUN 24* 04/03/2013 0251   CREATININE 0.86 04/03/2013 0251   CREATININE 0.84 10/07/2012 1720   CALCIUM 8.2* 04/03/2013 0251   PROT 6.2 04/02/2013 0725   ALBUMIN 2.7* 04/02/2013 0725   AST 21 04/02/2013 0725   ALT 11 04/02/2013 0725   ALKPHOS 77 04/02/2013 0725   BILITOT 0.6 04/02/2013 0725   GFRNONAA 84* 04/03/2013 0251   GFRAA >90 04/03/2013 0251       Component Value Date/Time   WBC 9.1 04/03/2013 0251   WBC 8.6 04/02/2013 0725   WBC 10.9* 04/02/2013 0340   HGB 9.6* 04/03/2013 0940   HGB 8.8* 04/03/2013 0550   HGB 8.0* 04/03/2013 0251   HCT 29.1* 04/03/2013 0940   HCT 26.6* 04/03/2013 0550   HCT 25.1* 04/03/2013 0251   MCV 89.3 04/03/2013 0251   MCV 91.4 04/02/2013 0725   MCV 90.7 04/02/2013 0340    Lipid Panel     Component Value Date/Time   CHOL  Value: 123        ATP III CLASSIFICATION:  <200     mg/dL   Desirable  200-239  mg/dL   Borderline High  >=240    mg/dL   High        12/19/2009 0620   TRIG 68 12/19/2009 0620   HDL 38* 12/19/2009 0620   CHOLHDL 3.2 12/19/2009 0620   VLDL 14 12/19/2009 0620   LDLCALC  Value: 71        Total Cholesterol/HDL:CHD Risk Coronary Heart Disease Risk Table                     Men   Women  1/2 Average Risk   3.4   3.3  Average Risk       5.0   4.4  2 X Average Risk   9.6   7.1  3 X Average Risk  23.4   11.0        Use the calculated Patient Ratio above and the CHD Risk Table to determine the patient's CHD Risk.        ATP III CLASSIFICATION (LDL):  <100     mg/dL   Optimal  100-129  mg/dL   Near or Above                    Optimal  130-159  mg/dL   Borderline  160-189  mg/dL   High  >190     mg/dL   Very High 12/19/2009 0620    ABG    Component Value Date/Time   PHART 7.415 05/15/2012 0145   PCO2ART 46.9* 05/15/2012 0145   PO2ART 104.0* 05/15/2012 0145   HCO3 29.4* 05/15/2012 0145   TCO2 26.8 05/15/2012  0145   O2SAT 98.9 05/15/2012 0145     Lab Results  Component Value Date   TSH 2.980 04/03/2013   BNP (last 3 results)  Recent Labs  05/15/12 0020 05/16/12 0445 03/17/13 1835  PROBNP 4353.0* 2044.0* 3811.0*   Cardiac Panel (last 3 results) No results found for this basename: CKTOTAL, CKMB, TROPONINI, RELINDX,  in the last 72 hours  Iron/TIBC/Ferritin    Component Value Date/Time   IRON 84 10/28/2012 1540   FERRITIN 223 10/28/2012 1540     EKG Orders placed during the hospital encounter of 04/01/13  . EKG 12-LEAD  . EKG 12-LEAD  . EKG 12-LEAD  . EKG 12-LEAD  . 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 Patient counseled for approximately 5 minutes regarding the health risks of ongoing nicotine use, specifically all types of cancer, heart disease, stroke and respiratory failure. The options available for help with cessation ,the behavioral changes to assist the process, and the option to either gradully reduce usage  Or abruptly stop.is also discussed. Pt is also encouraged to set  specific goals in number of cigarettes used daily, as well as to set a quit date.     Encounter for long-term (current) use of other medications   Acute blood loss anemia       Imaging: Dg Chest 2 View  03/17/2013   CLINICAL DATA:  Shortness of breath. History of COPD and congestive heart failure.  EXAM: CHEST  2 VIEW  COMPARISON:  CT chest 03/18/2011 and PA and lateral chest 06/05/2012.  FINDINGS: There is cardiomegaly without edema. The lungs are emphysematous but clear. No pneumothorax or pleural effusion.  IMPRESSION: Cardiomegaly without edema.  Emphysema.   Electronically Signed   By: Inge Rise M.D.   On: 03/17/2013 19:48

## 2013-04-14 DIAGNOSIS — F419 Anxiety disorder, unspecified: Secondary | ICD-10-CM | POA: Insufficient documentation

## 2013-04-14 DIAGNOSIS — F5105 Insomnia due to other mental disorder: Secondary | ICD-10-CM

## 2013-04-14 DIAGNOSIS — H409 Unspecified glaucoma: Secondary | ICD-10-CM | POA: Insufficient documentation

## 2013-04-14 LAB — GLUCOSE, CAPILLARY: Glucose-Capillary: 28 mg/dL — CL (ref 70–99)

## 2013-04-14 NOTE — Progress Notes (Signed)
   Subjective:    Patient ID: Theodore [name], male    DOB: Sep 18, 1940, 73 y.o.   MRN: [id number]  HPI Pt in for hospital f/u from 01/20 to 01/23 for anterior epistaxis, requiring transfusion. He has been maintained on coumadin for a fib and followed by the coumadin clinic, this is the first episode of bleeding to the extent that he required hospitalization, currently off any anti coagulant , and whether or not he is restarted on one is left to the discretion of the cardiologist Theodore [name] still feel s a bit weak , but intends to do his bets to regain his strength. He continues to overuse his symbicort  And always "runs out early"., multiple attempts have been made to describe correct use, a sample to "tide him over is available today. Breathing  Is at baseline , though he feels more tired, presumably due to recent blood loss and hospitalization  Review of Systems See HPI Denies recent fever or chills. Denies sinus pressure, nasal congestion, ear pain or sore throat. Denies chest congestion, productive cough chronic  wheezing. Denies chest pains, palpitations, PND or orhtopnea and leg swelling Denies abdominal pain, nausea, vomiting,diarrhea or constipation.   Denies dysuria, frequency, hesitancy or incontinence. Denies joint pain, swelling and limitation in mobility. Denies headaches, seizures, numbness, or tingling. Denies depression has mild , anxiety and  Insomnia, good response to restoril Denies skin break down or rash.        Objective:   Physical Exam  Patient alert and oriented and in mild  cardiopulmonary distress.Will become hypoxic with activity, adamant that he does not want to be  Evaluated for oxygen use , which he clearly would benefit from. He continues to smoke , which is a big part of the problem I believe  HEENT: No facial asymmetry, EOMI, no sinus tenderness,  oropharynx pink and moist.  Neck decreased ROM, no JVD, no adenopathy.  Chest: Decreased air entry  throughout , scattered wheezes, No crackles  CVS: S1, S2 , no S3.  ABD: Soft non tender. Bowel sounds normal.  Ext: No edema  MS: Adequate though reduced  ROM spine, shoulders, hips and knees.  Skin: Intact, no ulcerations or rash noted.  Psych: Good eye contact, normal affect. Memory slightly impaired, mildly  anxious not  depressed appearing.  CNS: CN 2-12 intact, power, tone and sensation normal throughout.       Assessment & Plan:

## 2013-04-14 NOTE — Assessment & Plan Note (Signed)
Coumadin on hold currently, has nasal pack present still and goes to ENT in am for removal

## 2013-04-14 NOTE — Assessment & Plan Note (Signed)
Controlled, no change in medication  

## 2013-04-14 NOTE — Assessment & Plan Note (Signed)
Currently being treated on double therapy by opthalmology

## 2013-04-14 NOTE — Assessment & Plan Note (Signed)
No signs or symptoms of decompensation at this  visit

## 2013-04-14 NOTE — Assessment & Plan Note (Signed)
Unchanged. Patient counseled for approximately 5 minutes regarding the health risks of ongoing nicotine use, specifically all types of cancer, heart disease, stroke and respiratory failure. The options available for help with cessation ,the behavioral changes to assist the process, and the option to either gradully reduce usage  Or abruptly stop.is also discussed. Pt is also encouraged to set specific goals in number of cigarettes used daily, as well as to set a quit date.  

## 2013-04-14 NOTE — Assessment & Plan Note (Signed)
Severe and continues  To detreriorate due to ongoing nicotine use

## 2013-05-08 ENCOUNTER — Other Ambulatory Visit: Payer: Self-pay | Admitting: Family Medicine

## 2013-05-08 ENCOUNTER — Telehealth: Payer: Self-pay | Admitting: Family Medicine

## 2013-05-08 NOTE — Telephone Encounter (Signed)
Call came in directly from Theodore [name] requesting something for anxiety short term for patient following the recent loss of his spouse, xanax called in to the pharmacy, she is aware

## 2013-05-15 ENCOUNTER — Other Ambulatory Visit: Payer: Self-pay | Admitting: Family Medicine

## 2013-05-21 ENCOUNTER — Encounter: Payer: Self-pay | Admitting: Family Medicine

## 2013-05-21 ENCOUNTER — Encounter (INDEPENDENT_AMBULATORY_CARE_PROVIDER_SITE_OTHER): Payer: Self-pay

## 2013-05-21 ENCOUNTER — Ambulatory Visit (INDEPENDENT_AMBULATORY_CARE_PROVIDER_SITE_OTHER): Payer: Medicare Other | Admitting: Family Medicine

## 2013-05-21 VITALS — BP 152/70 | HR 92 | Resp 18 | Ht 67.5 in | Wt 145.1 lb

## 2013-05-21 DIAGNOSIS — I5030 Unspecified diastolic (congestive) heart failure: Secondary | ICD-10-CM

## 2013-05-21 DIAGNOSIS — F418 Other specified anxiety disorders: Secondary | ICD-10-CM | POA: Insufficient documentation

## 2013-05-21 DIAGNOSIS — Z0000 Encounter for general adult medical examination without abnormal findings: Secondary | ICD-10-CM

## 2013-05-21 DIAGNOSIS — M109 Gout, unspecified: Secondary | ICD-10-CM

## 2013-05-21 DIAGNOSIS — I10 Essential (primary) hypertension: Secondary | ICD-10-CM

## 2013-05-21 DIAGNOSIS — E790 Hyperuricemia without signs of inflammatory arthritis and tophaceous disease: Secondary | ICD-10-CM

## 2013-05-21 MED ORDER — MIRTAZAPINE 7.5 MG PO TABS: 7.5000 mg | ORAL_TABLET | Freq: Every day | ORAL | Status: DC

## 2013-05-21 MED ORDER — ALPRAZOLAM 0.25 MG PO TABS: ORAL_TABLET | ORAL | Status: DC

## 2013-05-21 NOTE — Patient Instructions (Addendum)
F/u in week of April 20, call if you need me before  Labs today CBC, chem 7, uric acid today  NEW for your sleep and depression is remeron, finish your current sleeping pill, this will start on Sunday, you will also be on xanax twice  daily , 2 week supplies at a time  You NEED to take the lasix and potassium EVERY day, you have a lot of fluid , which makes breathing difficult  Fall Prevention and Home Safety Falls cause injuries and can affect all age groups. It is possible to prevent falls.  HOW TO PREVENT FALLS  Wear shoes with rubber soles that do not have an opening for your toes.  Keep the inside and outside of your house well lit.  Use night lights throughout your home.  Remove clutter from floors.  Clean up floor spills.  Remove throw rugs or fasten them to the floor with carpet tape.  Do not place electrical cords across pathways.  Put grab bars by your tub, shower, and toilet. Do not use towel bars as grab bars.  Put handrails on both sides of the stairway. Fix loose handrails.  Do not climb on stools or stepladders, if possible.  Do not wax your floors.  Repair uneven or unsafe sidewalks, walkways, or stairs.  Keep items you use a lot within reach.  Be aware of pets.  Keep emergency numbers next to the telephone.  Put smoke detectors in your home and near bedrooms. Ask your doctor what other things you can do to prevent falls. Document Released: 12/24/2008 Document Revised: 08/29/2011 Document Reviewed: 05/30/2011 [facility] Patient Information 2014 [place], [place].

## 2013-05-21 NOTE — Progress Notes (Signed)
Subjective:    Patient ID: Theodore [name], male    DOB: 09/24/1940, 73 y.o.   MRN: [id number]  HPI Preventive Screening-Counseling & Management   Patient present here today for a Medicare annual wellness visit.   Current Problems (verified)   Medications Prior to Visit Allergies (verified)   PAST HISTORY  Family History: documented elsewhere  Social History Widower, and father of 2 sons. Denies alcohol use , current nicotine use, no street drugs   Risk Factors  Current exercise habits: very little, poor exercise tolerance due to severe COPD, needs oxygen but refuses, chair exercises discussed   Dietary issues discussed:Low salt, w fluid restriction   Cardiac risk factors:   Depression Screen  (Note: if answer to either of the following is "Yes", a more complete depression screening is indicated)   Over the past two weeks, have you felt down, depressed or hopeless? Yes due to recent unexpected death of spouse having a grief reaction as expected Over the past two weeks, have you felt little interest or pleasure in doing things? Yes due to acute grief  Have you lost interest or pleasure in daily life? No  Do you often feel hopeless? No  Do you cry easily over simple problems? Currently tearful but in acute grief  Activities of Daily Living  In your present state of health, do you have any difficulty performing the following activities?  Driving?: yes , poor night vision Managing money?: No Feeding yourself?:No Getting from bed to chair?:No Climbing a flight of stairs?:yes , SOB Preparing food and eating?:No Bathing or showering?:No Getting dressed?:No Getting to the toilet?:No Using the toilet?:No Moving around from place to place?: yes gets SOB  Fall Risk Assessment In the past year have you fallen or had a near fall?:No Are you currently taking any medications that make you dizzy?:No   Hearing Difficulties: No Do you often ask people to speak up or repeat  themselves?:No Do you experience ringing or noises in your ears?:No Do you have difficulty understanding soft or whispered voices?:No  Cognitive Testing  Alert? Yes Normal Appearance?Yes  Oriented to person? Yes Place? Yes  Time? Yes  Displays appropriate judgment?Yes  Can read the correct time from a watch face? yes Are you having problems remembering things?No  Advanced Directives have been discussed with the patient?Yes , unwilling to commit to a decision at this time, due to recent painful loss of spouse   List the Names of Other Physician/Practitioners you currently use: Cardiology, urology   Indicate any recent Medical Services you may have received from other than [facility] providers in the past year (date may be approximate).   Assessment:    Annual Wellness Exam   Plan:    During the course of the visit the patient was educated and counseled about appropriate screening and preventive services including:  A healthy diet is rich in fruit, vegetables and whole grains. Poultry fish, nuts and beans are a healthy choice for protein rather then red meat. A low sodium diet and drinking 64 ounces of water daily is generally recommended. Oils and sweet should be limited. Carbohydrates especially for those who are diabetic or overweight, should be limited to 30-45 gram per meal. It is important to eat on a regular schedule, at least 3 times daily. Snacks should be primarily fruits, vegetables or nuts. It is important that you exercise regularly at least 30 minutes 5 times a week. If you develop chest pain, have severe difficulty breathing, or feel  very tired, stop exercising immediately and seek medical attention  Immunization reviewed and updated. Cancer screening reviewed and updated    Patient Instructions (the written plan) was given to the patient.  Medicare Attestation  I have personally reviewed:  The patient's medical and social history  Their use of alcohol, tobacco or illicit  drugs  Their current medications and supplements  The patient's functional ability including ADLs,fall risks, home safety risks, cognitive, and hearing and visual impairment  Diet and physical activities  Evidence for depression or mood disorders  The patient's weight, height, BMI, and visual acuity have been recorded in the chart. I have made referrals, counseling, and provided education to the patient based on review of the above and I have provided the patient with a written personalized care plan for preventive services.      Review of Systems     Objective:   Physical Exam        Assessment & Plan:  Routine general medical examination at a health care facility Annual exam as documented. Counseling done  re healthy lifestyle involving commitment to 150 minutes exercise per week, heart healthy diet, and attaining healthy weight.The importance of adequate sleep also discussed. Regular seat belt use and safe storage  of firearms if patient has them, is also discussed. Changes in health habits are decided on by the patient with goals and time frames  set for achieving them. Immunization and cancer screening needs are specifically addressed at this visit. 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  No interest in therapy at this time, not suicidal or homicidal and reports good support  Hyperuricemia Needs allopurinol top lower level and reduce risk of acute gout flare

## 2013-05-23 ENCOUNTER — Telehealth: Payer: Self-pay

## 2013-05-23 DIAGNOSIS — E790 Hyperuricemia without signs of inflammatory arthritis and tophaceous disease: Secondary | ICD-10-CM | POA: Insufficient documentation

## 2013-05-23 LAB — BASIC METABOLIC PANEL
BUN: 14 mg/dL (ref 6–23)
CO2: 34 mEq/L — ABNORMAL HIGH (ref 19–32)
Calcium: 8.6 mg/dL (ref 8.4–10.5)
Chloride: 98 mEq/L (ref 96–112)
Creat: 1.06 mg/dL (ref 0.50–1.35)
GLUCOSE: 87 mg/dL (ref 70–99)
Potassium: 3.2 mEq/L — ABNORMAL LOW (ref 3.5–5.3)
SODIUM: 142 meq/L (ref 135–145)

## 2013-05-23 LAB — CBC WITH DIFFERENTIAL/PLATELET
Basophils Absolute: 0 10*3/uL (ref 0.0–0.1)
Basophils Relative: 0 % (ref 0–1)
Eosinophils Absolute: 0.1 10*3/uL (ref 0.0–0.7)
Eosinophils Relative: 1 % (ref 0–5)
HEMATOCRIT: 34.1 % — AB (ref 39.0–52.0)
Hemoglobin: 10.7 g/dL — ABNORMAL LOW (ref 13.0–17.0)
Lymphocytes Relative: 19 % (ref 12–46)
Lymphs Abs: 1 10*3/uL (ref 0.7–4.0)
MCH: 28 pg (ref 26.0–34.0)
MCHC: 31.4 g/dL (ref 30.0–36.0)
MCV: 89.3 fL (ref 78.0–100.0)
MONO ABS: 0.9 10*3/uL (ref 0.1–1.0)
MONOS PCT: 17 % — AB (ref 3–12)
NEUTROS ABS: 3.5 10*3/uL (ref 1.7–7.7)
Neutrophils Relative %: 63 % (ref 43–77)
Platelets: 388 10*3/uL (ref 150–400)
RBC: 3.82 MIL/uL — ABNORMAL LOW (ref 4.22–5.81)
RDW: 15 % (ref 11.5–15.5)
WBC: 5.5 10*3/uL (ref 4.0–10.5)

## 2013-05-23 LAB — URIC ACID: Uric Acid, Serum: 12.2 mg/dL — ABNORMAL HIGH (ref 4.0–7.8)

## 2013-05-23 MED ORDER — POTASSIUM CHLORIDE ER 10 MEQ PO TBCR: 10.0000 meq | EXTENDED_RELEASE_TABLET | Freq: Two times a day (BID) | ORAL | Status: DC

## 2013-05-23 MED ORDER — ALBUTEROL SULFATE HFA 108 (90 BASE) MCG/ACT IN AERS
2.0000 | INHALATION_SPRAY | Freq: Four times a day (QID) | RESPIRATORY_TRACT | Status: DC | PRN
Start: 2013-05-23 — End: 2014-03-02

## 2013-05-23 MED ORDER — ALLOPURINOL 300 MG PO TABS: 300.0000 mg | ORAL_TABLET | Freq: Every day | ORAL | Status: DC

## 2013-05-23 MED ORDER — TRAMADOL HCL 50 MG PO TABS: 50.0000 mg | ORAL_TABLET | Freq: Every day | ORAL | Status: DC | PRN

## 2013-05-23 MED ORDER — BUDESONIDE-FORMOTEROL FUMARATE 160-4.5 MCG/ACT IN AERO: 2.0000 | INHALATION_SPRAY | Freq: Two times a day (BID) | RESPIRATORY_TRACT | Status: DC

## 2013-05-23 NOTE — Addendum Note (Signed)
Addended by: Denman George B on: 05/23/2013 11:06 AM   Modules accepted: Orders

## 2013-05-23 NOTE — Telephone Encounter (Signed)
Ok, send in limited amt tramadol 50 mg one daily, as needed , for pain  #20 refill zero  Only, pls advise him to use it only if he has pain, (states he has gout pain in foot) Explain he is already on a lot of different meds that will make him sleepy

## 2013-05-23 NOTE — Telephone Encounter (Signed)
Noted.  rx printed and faxed to pharmacy

## 2013-05-23 NOTE — Telephone Encounter (Signed)
Patient aware.

## 2013-05-25 ENCOUNTER — Encounter: Payer: Self-pay | Admitting: Family Medicine

## 2013-05-25 DIAGNOSIS — Z0000 Encounter for general adult medical examination without abnormal findings: Secondary | ICD-10-CM | POA: Insufficient documentation

## 2013-05-25 NOTE — Assessment & Plan Note (Signed)
3 plus bilateral pitting edema, pt has not been taking meds as prescribed, importance of same stressed and message also to be relayed by the nurse to case managerfor follow up at home

## 2013-05-25 NOTE — Assessment & Plan Note (Signed)
Needs allopurinol top lower level and reduce risk of acute gout flare

## 2013-05-25 NOTE — Assessment & Plan Note (Signed)
Acute grief with poor appetite , poor sleep and tearfulness, pt will start medication for this .  No interest in therapy at this time, not suicidal or homicidal and reports good support

## 2013-05-25 NOTE — Assessment & Plan Note (Signed)
Annual exam as documented. Counseling done  re healthy lifestyle involving commitment to 150 minutes exercise per week, heart healthy diet, and attaining healthy weight.The importance of adequate sleep also discussed. Regular seat belt use and safe storage  of firearms if patient has them, is also discussed. Changes in health habits are decided on by the patient with goals and time frames  set for achieving them. Immunization and cancer screening needs are specifically addressed at this visit. Fall prevention al;so discussed

## 2013-05-27 ENCOUNTER — Telehealth: Payer: Self-pay | Admitting: Family Medicine

## 2013-05-27 DIAGNOSIS — I5030 Unspecified diastolic (congestive) heart failure: Secondary | ICD-10-CM

## 2013-05-27 NOTE — Telephone Encounter (Signed)
Pt needs to go to the ED today for evaluation for CHF, oxygen noted is lower than at last visit, and is TOO low. pls ensure pt understands also tell case worker the plan so she encourages him to go  Also Whitney fax note to nurse in [place] about pt coming in eval of  Decompensated chf once he agrees  , please

## 2013-05-27 NOTE — Telephone Encounter (Signed)
Pt refused to go to the ED. Will go this evening for some labs ordered by Dr Moshe Cipro

## 2013-05-27 NOTE — Addendum Note (Signed)
Addended by: Eual Fines on: 05/27/2013 01:24 PM   Modules accepted: Orders

## 2013-05-28 LAB — BASIC METABOLIC PANEL
BUN: 11 mg/dL (ref 6–23)
CO2: 36 mEq/L — ABNORMAL HIGH (ref 19–32)
Calcium: 8.7 mg/dL (ref 8.4–10.5)
Chloride: 101 mEq/L (ref 96–112)
Creat: 1.28 mg/dL (ref 0.50–1.35)
Glucose, Bld: 96 mg/dL (ref 70–99)
POTASSIUM: 4.2 meq/L (ref 3.5–5.3)
Sodium: 142 mEq/L (ref 135–145)

## 2013-05-28 LAB — BRAIN NATRIURETIC PEPTIDE: BRAIN NATRIURETIC PEPTIDE: 627.6 pg/mL — AB (ref 0.0–100.0)

## 2013-06-03 NOTE — Progress Notes (Signed)
AUG 2014: 145 LBS TCS SEP 2014 SIMPLE ADENOMAS(7) / EGD GASTRIRTIS NL DUO Bx . MAR 2015: HB 10.5

## 2013-06-11 ENCOUNTER — Telehealth: Payer: Self-pay

## 2013-06-11 NOTE — Telephone Encounter (Signed)
Patient made aware that he has refills at pharmacy.  [facility] notified of need.  Refills sent. Pharmacy notified that patient can have an early fill if insurance will cover.  Sample offered of symbicort from office.

## 2013-06-13 ENCOUNTER — Emergency Department (HOSPITAL_COMMUNITY)
Admission: EM | Admit: 2013-06-13 | Discharge: 2013-06-13 | Disposition: A | Discharge status: Home / Self Care | Payer: Medicare Other | Attending: Emergency Medicine | Admitting: Emergency Medicine

## 2013-06-13 ENCOUNTER — Emergency Department (HOSPITAL_COMMUNITY): Payer: Medicare Other

## 2013-06-13 ENCOUNTER — Encounter (HOSPITAL_COMMUNITY): Payer: Self-pay | Admitting: Emergency Medicine

## 2013-06-13 DIAGNOSIS — R000 Tachycardia, unspecified: Secondary | ICD-10-CM | POA: Insufficient documentation

## 2013-06-13 DIAGNOSIS — Z79899 Other long term (current) drug therapy: Secondary | ICD-10-CM | POA: Insufficient documentation

## 2013-06-13 DIAGNOSIS — Z7901 Long term (current) use of anticoagulants: Secondary | ICD-10-CM | POA: Insufficient documentation

## 2013-06-13 DIAGNOSIS — I10 Essential (primary) hypertension: Secondary | ICD-10-CM | POA: Insufficient documentation

## 2013-06-13 DIAGNOSIS — Z8619 Personal history of other infectious and parasitic diseases: Secondary | ICD-10-CM | POA: Insufficient documentation

## 2013-06-13 DIAGNOSIS — G4700 Insomnia, unspecified: Secondary | ICD-10-CM | POA: Insufficient documentation

## 2013-06-13 DIAGNOSIS — J45901 Unspecified asthma with (acute) exacerbation: Secondary | ICD-10-CM

## 2013-06-13 DIAGNOSIS — I4891 Unspecified atrial fibrillation: Secondary | ICD-10-CM | POA: Insufficient documentation

## 2013-06-13 DIAGNOSIS — F17200 Nicotine dependence, unspecified, uncomplicated: Secondary | ICD-10-CM | POA: Insufficient documentation

## 2013-06-13 DIAGNOSIS — J441 Chronic obstructive pulmonary disease with (acute) exacerbation: Secondary | ICD-10-CM | POA: Insufficient documentation

## 2013-06-13 DIAGNOSIS — R110 Nausea: Secondary | ICD-10-CM | POA: Insufficient documentation

## 2013-06-13 DIAGNOSIS — R002 Palpitations: Secondary | ICD-10-CM | POA: Insufficient documentation

## 2013-06-13 DIAGNOSIS — I509 Heart failure, unspecified: Secondary | ICD-10-CM | POA: Insufficient documentation

## 2013-06-13 DIAGNOSIS — Z862 Personal history of diseases of the blood and blood-forming organs and certain disorders involving the immune mechanism: Secondary | ICD-10-CM | POA: Insufficient documentation

## 2013-06-13 DIAGNOSIS — Z8669 Personal history of other diseases of the nervous system and sense organs: Secondary | ICD-10-CM | POA: Insufficient documentation

## 2013-06-13 LAB — CBC WITH DIFFERENTIAL/PLATELET
BASOS ABS: 0 10*3/uL (ref 0.0–0.1)
Basophils Relative: 1 % (ref 0–1)
EOS PCT: 1 % (ref 0–5)
Eosinophils Absolute: 0.1 10*3/uL (ref 0.0–0.7)
HCT: 30.8 % — ABNORMAL LOW (ref 39.0–52.0)
Hemoglobin: 9.4 g/dL — ABNORMAL LOW (ref 13.0–17.0)
Lymphocytes Relative: 8 % — ABNORMAL LOW (ref 12–46)
Lymphs Abs: 0.6 10*3/uL — ABNORMAL LOW (ref 0.7–4.0)
MCH: 27.9 pg (ref 26.0–34.0)
MCHC: 30.5 g/dL (ref 30.0–36.0)
MCV: 91.4 fL (ref 78.0–100.0)
Monocytes Absolute: 0.7 10*3/uL (ref 0.1–1.0)
Monocytes Relative: 10 % (ref 3–12)
Neutro Abs: 6 10*3/uL (ref 1.7–7.7)
Neutrophils Relative %: 81 % — ABNORMAL HIGH (ref 43–77)
Platelets: 323 10*3/uL (ref 150–400)
RBC: 3.37 MIL/uL — ABNORMAL LOW (ref 4.22–5.81)
RDW: 16.5 % — AB (ref 11.5–15.5)
WBC: 7.4 10*3/uL (ref 4.0–10.5)

## 2013-06-13 LAB — BASIC METABOLIC PANEL
BUN: 20 mg/dL (ref 6–23)
CALCIUM: 9.2 mg/dL (ref 8.4–10.5)
CO2: 30 mEq/L (ref 19–32)
Chloride: 99 mEq/L (ref 96–112)
Creatinine, Ser: 1.12 mg/dL (ref 0.50–1.35)
GFR, EST AFRICAN AMERICAN: 73 mL/min — AB (ref >90)
GFR, EST NON AFRICAN AMERICAN: 63 mL/min — AB (ref >90)
Glucose, Bld: 110 mg/dL — ABNORMAL HIGH (ref 70–99)
Potassium: 3.9 mEq/L (ref 3.7–5.3)
SODIUM: 142 meq/L (ref 137–147)

## 2013-06-13 LAB — TROPONIN I: Troponin I: <0.3 ng/mL (ref <0.30)

## 2013-06-13 LAB — PRO B NATRIURETIC PEPTIDE: PRO B NATRI PEPTIDE: 5155 pg/mL — AB (ref 0–125)

## 2013-06-13 MED ORDER — METOPROLOL TARTRATE 1 MG/ML IV SOLN
5.0000 mg | Freq: Once | INTRAVENOUS | Status: AC
  Administered 2013-06-13: 5 mg via INTRAVENOUS
  Filled 2013-06-13: qty 5

## 2013-06-13 MED ORDER — FUROSEMIDE 10 MG/ML IJ SOLN
80.0000 mg | Freq: Once | INTRAMUSCULAR | Status: AC
  Administered 2013-06-13: 80 mg via INTRAVENOUS
  Filled 2013-06-13: qty 8

## 2013-06-13 MED ORDER — FUROSEMIDE 20 MG PO TABS: 40.0000 mg | ORAL_TABLET | Freq: Two times a day (BID) | ORAL | Status: DC

## 2013-06-13 MED ORDER — POTASSIUM CHLORIDE ER 10 MEQ PO TBCR: 40.0000 meq | EXTENDED_RELEASE_TABLET | Freq: Two times a day (BID) | ORAL | Status: DC

## 2013-06-13 MED ORDER — ALBUTEROL SULFATE HFA 108 (90 BASE) MCG/ACT IN AERS
2.0000 | INHALATION_SPRAY | RESPIRATORY_TRACT | Status: DC
  Administered 2013-06-13: 2 via RESPIRATORY_TRACT
  Filled 2013-06-13: qty 6.7

## 2013-06-13 NOTE — Discharge Instructions (Signed)
I have increased your Lasix to twice daily for one week. After one week resume your 40 mg once daily dose. If you're feeling worse at home with her shortness of breath or any symptoms please return to the emergency room for admission.  Atrial Fibrillation Atrial fibrillation is a condition that causes your heart to beat irregularly. It may also cause your heart to beat faster than normal. Atrial fibrillation can prevent your heart from pumping blood normally. It increases your risk of stroke and heart problems. HOME CARE  Take medications as told by your doctor.  Only take medications that your doctor says are safe. Some medications can make the condition worse or happen again.  If blood thinners were prescribed by your doctor, take them exactly as told. Too much can cause bleeding. Too little and you will not have the needed protection against stroke and other problems.  Perform blood tests at home if told by your doctor.  Perform blood tests exactly as told by your doctor.  Do not drink alcohol.  Do not drink beverages with caffeine such as coffee, soda, and some teas.  Maintain a healthy weight.  Do not use diet pills unless your doctor says they are safe. They may make heart problems worse.  Follow diet instructions as told by your doctor.  Exercise regularly as told by your doctor.  Keep all follow-up appointments. GET HELP RIGHT AWAY IF:   You have chest or belly (abdominal) pain.  You feel sick to your stomach (nauseous)  You suddenly have swollen feet and ankles.  You feel dizzy.  You face, arms, or legs feel numb or weak.  There is a change in your vision or speech.  You notice a change in the speed, rhythm, or strength of your heartbeat.  You suddenly begin peeing (urinating) more often.  You get tired more easily when moving or exercising. MAKE SURE YOU:   Understand these instructions.  Will watch your condition.  Will get help right away if you are  not doing well or get worse. Document Released: 12/07/2007 Document Revised: 06/24/2012 Document Reviewed: 04/09/2012 [facility] Patient Information 2014 [place].  Heart Failure Heart failure means your heart has trouble pumping blood. This makes it hard for your body to work well. Heart failure is usually a long-term (chronic) condition. You must take good care of yourself and follow your doctor's treatment plan. HOME CARE  Take your heart medicine as told by your doctor.  Do not stop taking medicine unless your doctor tells you to.  Do not skip any dose of medicine.  Refill your medicines before they run out.  Take other medicines only as told by your doctor or pharmacist.  Stay active if told by your doctor. The elderly and people with severe heart failure should talk with a doctor about physical activity.  Eat heart healthy foods. Choose foods that are without trans fat and are low in saturated fat, cholesterol, and salt (sodium). This includes fresh or frozen fruits and vegetables, fish, lean meats, fat-free or low-fat dairy foods, whole grains, and high-fiber foods. Lentils and dried peas and beans (legumes) are also good choices.  Limit salt if told by your doctor.  Cook in a healthy way. Roast, grill, broil, bake, poach, steam, or stir-fry foods.  Limit fluids as told by your doctor.  Weigh yourself every morning. Do this after you pee (urinate) and before you eat breakfast. Write down your weight to give to your doctor.  Take your  blood pressure and write it down if your doctor tell you to.  Ask your doctor how to check your pulse. Check your pulse as told.  Lose weight if told by your doctor.  Stop smoking or chewing tobacco. Do not use gum or patches that help you quit without your doctor's approval.  Schedule and go to doctor visits as told.  Nonpregnant women should have no more than 1 drink a day. Men should have no more than 2 drinks a day. Talk to your  doctor about drinking alcohol.  Stop illegal drug use.  Stay current with shots (immunizations).  Manage your health conditions as told by your doctor.  Learn to manage your stress.  Rest when you are tired.  If it is really hot outside:  Avoid intense activities.  Use air conditioning or fans, or get in a cooler place.  Avoid caffeine and alcohol.  Wear loose-fitting, lightweight, and light-colored clothing.  If it is really cold outside:  Avoid intense activities.  Layer your clothing.  Wear mittens or gloves, a hat, and a scarf when going outside.  Avoid alcohol.  Learn about heart failure and get support as needed.  Get help to maintain or improve your quality of life and your ability to care for yourself as needed. GET HELP IF:   You gain 03 lb/1.4 kg or more in 1 day or 05 lb/2.3 kg in a week.  You are more short of breath than usual.  You cannot do your normal activities.  You tire easily.  You cough more than normal, especially with activity.  You have any or more puffiness (swelling) in areas such as your hands, feet, ankles, or belly (abdomen).  You cannot sleep because it is hard to breathe.  You feel like your heart is beating fast (palpitations).  You get dizzy or lightheaded when you stand up. GET HELP RIGHT AWAY IF:   You have trouble breathing.  There is a change in mental status, such as becoming less alert or not being able to focus.  You have chest pain or discomfort.  You faint. MAKE SURE YOU:   Understand these instructions.  Will watch your condition.  Will get help right away if you are not doing well or get worse. Document Released: 12/07/2007 Document Revised: 06/24/2012 Document Reviewed: 09/28/2011 [facility] Patient Information 2014 [place], [place].

## 2013-06-13 NOTE — ED Notes (Signed)
Pt co SOB and bilateral leg swelling x 1 week.

## 2013-06-13 NOTE — ED Provider Notes (Signed)
CSN: [id number]     Arrival date & time 06/13/13  1039 History   First MD Initiated Contact with Patient 06/13/13 1051    This chart was scribed for Tanna Furry, MD by Terressa Koyanagi, ED Scribe. This patient was seen in room APA07/APA07 and the patient's care was started at 11:09 AM.  PCP: Tula Nakayama, MD  Chief Complaint  Patient presents with  . Leg Swelling    bilateral   HPI HPI Comments: Theodore [name] is a 73 y.o. male, with a history of HTN, SOB, COPD, congestive heart failure, asthma and edema in the lower extremities, who presents to the Emergency Department complaining of swelling of the BLE onset one week ago, and associated, intermittent SOB (pt reports the SOB is aggravated when lying down); palpitations with any activity; cough; nausea and insomnia. PT reports that his home nurse advised him to come to the ED if the swelling of his BLE did not subside. Pt reports that he has been taking his lasix as directed without improvement to the swelling in his BLE. Pt denies chest pain, palpitations or vomiting.   Echo 03/2012:  EF 55-60%, Grade II diastolic dysfunction,   Pt is a smoker who smokes approximately .5 ppd. Pt denies use of alcohol.    Past Medical History  Diagnosis Date  . Anemia     Hemoglobin 11.6 in 04/2008->resolved   . Edema     Lower Extremity  . Tobacco abuse   . Glaucoma   . Allergic rhinitis   . Atrial fibrillation     Coumadin;asymptomatic bradycardia on telemetry in 12/2009  . CHF (congestive heart failure)     H/o CHF with preserved LV EF; pulmonary hypertension; normal EF in 03/2009  . Hypertension   . Shingles   . Shortness of breath   . COPD (chronic obstructive pulmonary disease)   . Asthma    Past Surgical History  Procedure Laterality Date  . Bilateral cataract surgery    . Herniorrhapy    . Tendon repair      right hand surgical procedure for a tendon repair  . Colonoscopy N/A 11/29/2012    Procedure: COLONOSCOPY;  Surgeon: Danie Binder, MD;  Location: [facility];  Service: Endoscopy;  Laterality: N/A;  1:00  . Esophagogastroduodenoscopy N/A 11/29/2012    Procedure: ESOPHAGOGASTRODUODENOSCOPY (EGD);  Surgeon: Danie Binder, MD;  Location: [facility];  Service: Endoscopy;  Laterality: N/A;  . Eye surgery    . Hernia repair     Family History  Problem Relation Age of Onset  . Cancer Mother   . Cancer Father   . Coronary artery disease Brother   . Colon cancer Brother     and possibly father? Patient is unsure   History  Substance Use Topics  . Smoking status: Current Some Day Smoker -- 0.50 packs/day for 55 years    Types: Cigarettes    Last Attempt to Quit: 02/13/2012  . Smokeless tobacco: Never Used     Comment: occasionally smokes  . Alcohol Use: No    Review of Systems  Constitutional: Negative for fever, chills, diaphoresis, appetite change and fatigue.       Insomnia   HENT: Negative for mouth sores, sore throat and trouble swallowing.   Eyes: Negative for visual disturbance.  Respiratory: Positive for cough and shortness of breath. Negative for chest tightness and wheezing.   Cardiovascular: Positive for palpitations and leg swelling. Negative for chest pain.  Gastrointestinal: Positive  for nausea. Negative for vomiting, abdominal pain, diarrhea and abdominal distention.  Endocrine: Negative for polydipsia, polyphagia and polyuria.  Genitourinary: Negative for dysuria, frequency and hematuria.  Musculoskeletal: Negative for gait problem.  Skin: Negative for color change, pallor and rash.  Neurological: Negative for dizziness, syncope, light-headedness and headaches.  Hematological: Does not bruise/bleed easily.  Psychiatric/Behavioral: Negative for behavioral problems and confusion.   Allergies  Aspirin  Home Medications   Current Outpatient Rx  Name  Route  Sig  Dispense  Refill  . albuterol (PROAIR HFA) 108 (90 BASE) MCG/ACT inhaler   Inhalation   Inhale 2 puffs into the lungs  every 6 (six) hours as needed for wheezing or shortness of breath.   6.7 g   4   . albuterol (PROVENTIL) (2.5 MG/3ML) 0.083% nebulizer solution      INHALE 1 VIAL VIA NEBULIZER EVERY 6 HOURS AS NEEDED   375 mL   0   . allopurinol (ZYLOPRIM) 300 MG tablet   Oral   Take 1 tablet (300 mg total) by mouth daily.   30 tablet   6   . ALPRAZolam (XANAX) 0.25 MG tablet      TAKE 1 TABLET BY MOUTH TWICE DAILY AS NEEDED   28 tablet   0   . brimonidine-timolol (COMBIGAN) 0.2-0.5 % ophthalmic solution   Both Eyes   Place 1 drop into both eyes every 12 (twelve) hours.           . budesonide-formoterol (SYMBICORT) 160-4.5 MCG/ACT inhaler   Inhalation   Inhale 2 puffs into the lungs 2 (two) times daily.   1 Inhaler   4   . CARTIA XT 120 MG 24 hr capsule   Oral   Take 120 mg by mouth daily.         . furosemide (LASIX) 40 MG tablet   Oral   Take 1 tablet (40 mg total) by mouth daily.   60 tablet   6   . metoprolol (LOPRESSOR) 50 MG tablet   Oral   Take 1 tablet (50 mg total) by mouth 2 (two) times daily.   60 tablet   1   . mirtazapine (REMERON) 7.5 MG tablet   Oral   Take 1 tablet (7.5 mg total) by mouth at bedtime.   30 tablet   3     Start on May 25, 2013. Stop restoril on March 15 ...   . montelukast (SINGULAIR) 10 MG tablet   Oral   Take 10 mg by mouth at bedtime.         . potassium chloride (KLOR-CON 10) 10 MEQ tablet   Oral   Take 1 tablet (10 mEq total) by mouth 2 (two) times daily.   60 tablet   5     Dose increase effective 05/23/2013   . temazepam (RESTORIL) 15 MG capsule   Oral   Take 15 mg by mouth at bedtime as needed.         . tiotropium (SPIRIVA) 18 MCG inhalation capsule   Inhalation   Place 18 mcg into inhaler and inhale daily.         . traMADol (ULTRAM) 50 MG tablet   Oral   Take 1 tablet (50 mg total) by mouth daily as needed.   20 tablet   0   . travoprost, benzalkonium, (TRAVATAN Z) 0.004 % ophthalmic solution    Both Eyes   Place 1 drop into both eyes at bedtime.          Marland Kitchen  warfarin (COUMADIN) 4 MG tablet   Oral   Take 4-6 mg by mouth as directed.          . furosemide (LASIX) 20 MG tablet   Oral   Take 2 tablets (40 mg total) by mouth 2 (two) times daily.   28 tablet   0     Take 40 mg twice a day for 7 days, then resume 40  ...   . potassium chloride (K-DUR) 10 MEQ tablet   Oral   Take 4 tablets (40 mEq total) by mouth 2 (two) times daily.   7 tablet   0     Take his prescription for 1 week. And then resume  ...    Triage Vitals: BP 163/96  Pulse 123  Temp(Src) 98 F (36.7 C) (Oral)  Resp 25  Ht 5\' 7"  (1.702 m)  Wt 140 lb (63.504 kg)  BMI 21.92 kg/m2  SpO2 92% Physical Exam  Nursing note reviewed. Constitutional: He is oriented to person, place, and time. He appears well-developed and well-nourished. No distress.  HENT:  Head: Normocephalic.  Eyes: Conjunctivae are normal. Pupils are equal, round, and reactive to light. No scleral icterus.  Neck: Normal range of motion. Neck supple. No thyromegaly present.  Cardiovascular: Normal rate and regular rhythm.  Exam reveals no gallop and no friction rub.   No murmur heard. JVD at 45 degrees.  Heart: tachycardic, irregularly irregular.  BLE edema  Pulmonary/Chest: Effort normal and breath sounds normal. No respiratory distress. He has no wheezes. He has no rales.  Bibasilar crackles in lungs.   Abdominal: Soft. Bowel sounds are normal. He exhibits no distension. There is no tenderness. There is no rebound.  Musculoskeletal: Normal range of motion.  Neurological: He is alert and oriented to person, place, and time.  Skin: Skin is warm and dry. No rash noted.  Psychiatric: He has a normal mood and affect. His behavior is normal.    ED Course  Procedures (including critical care time) DIAGNOSTIC STUDIES: Oxygen Saturation is 92% on RA, low by my interpretation.    COORDINATION OF CARE:  11:15 AM-Discussed treatment  plan which includes possible admittance, meds, imaging, EKG and labs, with pt at bedside and pt agreed to plan.   Labs Review Labs Reviewed  CBC WITH DIFFERENTIAL - Abnormal; Notable for the following:    RBC 3.37 (*)    Hemoglobin 9.4 (*)    HCT 30.8 (*)    RDW 16.5 (*)    Neutrophils Relative % 81 (*)    Lymphocytes Relative 8 (*)    Lymphs Abs 0.6 (*)    All other components within normal limits  BASIC METABOLIC PANEL - Abnormal; Notable for the following:    Glucose, Bld 110 (*)    GFR calc non Af Amer 63 (*)    GFR calc Af Amer 73 (*)    All other components within normal limits  PRO B NATRIURETIC PEPTIDE - Abnormal; Notable for the following:    Pro B Natriuretic peptide (BNP) 5155.0 (*)    All other components within normal limits  TROPONIN I   Imaging Review Dg Chest 2 View  06/13/2013   CLINICAL DATA:  Bilateral leg swelling and shortness of breath for 1 week.  EXAM: CHEST  2 VIEW  COMPARISON:  03/17/2013  FINDINGS: The cardiac silhouette remains enlarged, unchanged. The lungs remain hyperinflated. There is mild cephalization of pulmonary blood flow, unchanged. No overt pulmonary edema is seen. No pleural effusion  or pneumothorax is identified. There is no airspace consolidation. No acute osseous abnormality is identified.  IMPRESSION: Cardiomegaly and mild pulmonary vascular congestion without overt edema.   Electronically Signed   By: Logan Bores   On: 06/13/2013 11:34     EKG Interpretation   Date/Time:  Friday June 13 2013 11:03:28 EDT Ventricular Rate:  109 PR Interval:    QRS Duration: 92 QT Interval:  342 QTC Calculation: 460 R Axis:   -67 Text Interpretation:  Atrial fibrillation  Left axis deviation Inferior  infarct (cited on or before 03-Apr-2013) Abnormal ECG Multifocal PVCs  Confirmed by Jeneen Rinks  MD, [place] ([id number]) on 06/13/2013 2:37:50 PM      MDM   Final diagnoses:  Atrial fibrillation with rapid ventricular response  Congestive heart failure     Discussion: On arrival patient's tells me almost first thing, but he does not want to stay in the hospital. His EKG shows A. fib with a rapid ventricular response. His rate is controlled with IV pressor x5 mg x2. Recheck heart rate is 11. He was given 80 of IV Lasix. He is diuresed 1 L. He states he feels "great" he. He politely declines admission the hospital. I told him that this would be my recommendation. He still politely declines. He is 91% on room air please immature to the bathroom from his room without symptoms. I given a prescription for Lasix 40 mg to take twice a day for the next 7 days with extra potassium twice a day for the next 7 days. Have asked him to see his primary care physician in the interval. I gave them careful precautions return to the emergency room with worsening symptoms. He expresses understanding of this.   I personally performed the services described in this documentation, which was scribed in my presence. The recorded information has been reviewed and is accurate.     Tanna Furry, MD 06/13/13 [phone]

## 2013-06-16 ENCOUNTER — Encounter (HOSPITAL_COMMUNITY): Payer: Self-pay | Admitting: Emergency Medicine

## 2013-06-16 ENCOUNTER — Inpatient Hospital Stay (HOSPITAL_COMMUNITY)
Admission: EM | Admit: 2013-06-16 | Discharge: 2013-06-24 | DRG: 291 | Disposition: A | Discharge status: Home Health | Payer: Medicare Other | Attending: Internal Medicine | Admitting: Internal Medicine

## 2013-06-16 ENCOUNTER — Other Ambulatory Visit: Payer: Self-pay

## 2013-06-16 ENCOUNTER — Emergency Department (HOSPITAL_COMMUNITY): Payer: Medicare Other

## 2013-06-16 DIAGNOSIS — E875 Hyperkalemia: Secondary | ICD-10-CM

## 2013-06-16 DIAGNOSIS — Z720 Tobacco use: Secondary | ICD-10-CM

## 2013-06-16 DIAGNOSIS — J449 Chronic obstructive pulmonary disease, unspecified: Secondary | ICD-10-CM

## 2013-06-16 DIAGNOSIS — D509 Iron deficiency anemia, unspecified: Secondary | ICD-10-CM | POA: Diagnosis present

## 2013-06-16 DIAGNOSIS — I2789 Other specified pulmonary heart diseases: Secondary | ICD-10-CM | POA: Diagnosis present

## 2013-06-16 DIAGNOSIS — I5033 Acute on chronic diastolic (congestive) heart failure: Principal | ICD-10-CM

## 2013-06-16 DIAGNOSIS — I10 Essential (primary) hypertension: Secondary | ICD-10-CM

## 2013-06-16 DIAGNOSIS — J9601 Acute respiratory failure with hypoxia: Secondary | ICD-10-CM

## 2013-06-16 DIAGNOSIS — R040 Epistaxis: Secondary | ICD-10-CM

## 2013-06-16 DIAGNOSIS — T502X5A Adverse effect of carbonic-anhydrase inhibitors, benzothiadiazides and other diuretics, initial encounter: Secondary | ICD-10-CM | POA: Diagnosis present

## 2013-06-16 DIAGNOSIS — I5032 Chronic diastolic (congestive) heart failure: Secondary | ICD-10-CM | POA: Diagnosis present

## 2013-06-16 DIAGNOSIS — I4891 Unspecified atrial fibrillation: Secondary | ICD-10-CM

## 2013-06-16 DIAGNOSIS — J9600 Acute respiratory failure, unspecified whether with hypoxia or hypercapnia: Secondary | ICD-10-CM | POA: Diagnosis present

## 2013-06-16 DIAGNOSIS — I509 Heart failure, unspecified: Secondary | ICD-10-CM

## 2013-06-16 DIAGNOSIS — Z0000 Encounter for general adult medical examination without abnormal findings: Secondary | ICD-10-CM

## 2013-06-16 DIAGNOSIS — Z79899 Other long term (current) drug therapy: Secondary | ICD-10-CM

## 2013-06-16 DIAGNOSIS — D649 Anemia, unspecified: Secondary | ICD-10-CM

## 2013-06-16 DIAGNOSIS — F418 Other specified anxiety disorders: Secondary | ICD-10-CM

## 2013-06-16 DIAGNOSIS — Z7901 Long term (current) use of anticoagulants: Secondary | ICD-10-CM

## 2013-06-16 DIAGNOSIS — Z8249 Family history of ischemic heart disease and other diseases of the circulatory system: Secondary | ICD-10-CM

## 2013-06-16 DIAGNOSIS — F17200 Nicotine dependence, unspecified, uncomplicated: Secondary | ICD-10-CM | POA: Diagnosis present

## 2013-06-16 DIAGNOSIS — I2721 Secondary pulmonary arterial hypertension: Secondary | ICD-10-CM

## 2013-06-16 DIAGNOSIS — Z800 Family history of malignant neoplasm of digestive organs: Secondary | ICD-10-CM

## 2013-06-16 DIAGNOSIS — I472 Ventricular tachycardia, unspecified: Secondary | ICD-10-CM | POA: Diagnosis present

## 2013-06-16 DIAGNOSIS — D72829 Elevated white blood cell count, unspecified: Secondary | ICD-10-CM

## 2013-06-16 DIAGNOSIS — J441 Chronic obstructive pulmonary disease with (acute) exacerbation: Secondary | ICD-10-CM

## 2013-06-16 DIAGNOSIS — I5030 Unspecified diastolic (congestive) heart failure: Secondary | ICD-10-CM

## 2013-06-16 DIAGNOSIS — F419 Anxiety disorder, unspecified: Secondary | ICD-10-CM

## 2013-06-16 DIAGNOSIS — I4729 Other ventricular tachycardia: Secondary | ICD-10-CM

## 2013-06-16 DIAGNOSIS — R079 Chest pain, unspecified: Secondary | ICD-10-CM

## 2013-06-16 DIAGNOSIS — K5900 Constipation, unspecified: Secondary | ICD-10-CM | POA: Diagnosis present

## 2013-06-16 DIAGNOSIS — M109 Gout, unspecified: Secondary | ICD-10-CM | POA: Diagnosis present

## 2013-06-16 DIAGNOSIS — E876 Hypokalemia: Secondary | ICD-10-CM | POA: Diagnosis present

## 2013-06-16 DIAGNOSIS — F5105 Insomnia due to other mental disorder: Secondary | ICD-10-CM

## 2013-06-16 DIAGNOSIS — H409 Unspecified glaucoma: Secondary | ICD-10-CM

## 2013-06-16 DIAGNOSIS — E790 Hyperuricemia without signs of inflammatory arthritis and tophaceous disease: Secondary | ICD-10-CM

## 2013-06-16 DIAGNOSIS — D62 Acute posthemorrhagic anemia: Secondary | ICD-10-CM

## 2013-06-16 DIAGNOSIS — J45901 Unspecified asthma with (acute) exacerbation: Secondary | ICD-10-CM

## 2013-06-16 HISTORY — DX: Unspecified diastolic (congestive) heart failure: I50.30

## 2013-06-16 HISTORY — DX: Pulmonary hypertension, unspecified: I27.20

## 2013-06-16 HISTORY — DX: Essential (primary) hypertension: I10

## 2013-06-16 LAB — BASIC METABOLIC PANEL
BUN: 18 mg/dL (ref 6–23)
CHLORIDE: 98 meq/L (ref 96–112)
CO2: 32 mEq/L (ref 19–32)
Calcium: 9.1 mg/dL (ref 8.4–10.5)
Creatinine, Ser: 1.09 mg/dL (ref 0.50–1.35)
GFR calc non Af Amer: 65 mL/min — ABNORMAL LOW (ref >90)
GFR, EST AFRICAN AMERICAN: 76 mL/min — AB (ref >90)
Glucose, Bld: 107 mg/dL — ABNORMAL HIGH (ref 70–99)
POTASSIUM: 3.4 meq/L — AB (ref 3.7–5.3)
Sodium: 145 mEq/L (ref 137–147)

## 2013-06-16 LAB — CBC WITH DIFFERENTIAL/PLATELET
BASOS ABS: 0.1 10*3/uL (ref 0.0–0.1)
BASOS PCT: 1 % (ref 0–1)
Eosinophils Absolute: 0.3 10*3/uL (ref 0.0–0.7)
Eosinophils Relative: 3 % (ref 0–5)
HCT: 30.4 % — ABNORMAL LOW (ref 39.0–52.0)
Hemoglobin: 9.3 g/dL — ABNORMAL LOW (ref 13.0–17.0)
Lymphocytes Relative: 9 % — ABNORMAL LOW (ref 12–46)
Lymphs Abs: 0.9 10*3/uL (ref 0.7–4.0)
MCH: 27.8 pg (ref 26.0–34.0)
MCHC: 30.6 g/dL (ref 30.0–36.0)
MCV: 91 fL (ref 78.0–100.0)
Monocytes Absolute: 1.5 10*3/uL — ABNORMAL HIGH (ref 0.1–1.0)
Monocytes Relative: 15 % — ABNORMAL HIGH (ref 3–12)
NEUTROS ABS: 7 10*3/uL (ref 1.7–7.7)
NEUTROS PCT: 72 % (ref 43–77)
PLATELETS: 359 10*3/uL (ref 150–400)
RBC: 3.34 MIL/uL — ABNORMAL LOW (ref 4.22–5.81)
RDW: 16.5 % — AB (ref 11.5–15.5)
WBC: 9.7 10*3/uL (ref 4.0–10.5)

## 2013-06-16 LAB — TROPONIN I
Troponin I: <0.3 ng/mL (ref <0.30)
Troponin I: <0.3 ng/mL (ref <0.30)

## 2013-06-16 LAB — HEPATIC FUNCTION PANEL
ALBUMIN: 3.3 g/dL — AB (ref 3.5–5.2)
ALT: 15 U/L (ref 0–53)
AST: 28 U/L (ref 0–37)
Alkaline Phosphatase: 107 U/L (ref 39–117)
BILIRUBIN TOTAL: 0.5 mg/dL (ref 0.3–1.2)
Bilirubin, Direct: <0.2 mg/dL (ref 0.0–0.3)
Total Protein: 7.9 g/dL (ref 6.0–8.3)

## 2013-06-16 LAB — PROTIME-INR
INR: 1.9 — AB (ref 0.00–1.49)
PROTHROMBIN TIME: 21.2 s — AB (ref 11.6–15.2)

## 2013-06-16 LAB — PRO B NATRIURETIC PEPTIDE: PRO B NATRI PEPTIDE: 3560 pg/mL — AB (ref 0–125)

## 2013-06-16 MED ORDER — DILTIAZEM HCL ER COATED BEADS 120 MG PO CP24
120.0000 mg | ORAL_CAPSULE | Freq: Every day | ORAL | Status: DC
  Administered 2013-06-17: 120 mg via ORAL
  Filled 2013-06-16: qty 1

## 2013-06-16 MED ORDER — MIRTAZAPINE 15 MG PO TABS
7.5000 mg | ORAL_TABLET | Freq: Every day | ORAL | Status: DC
  Administered 2013-06-16 – 2013-06-23 (×8): 7.5 mg via ORAL
  Filled 2013-06-16: qty 1
  Filled 2013-06-16 (×3): qty 0.5
  Filled 2013-06-16: qty 1
  Filled 2013-06-16 (×4): qty 0.5

## 2013-06-16 MED ORDER — WARFARIN SODIUM 4 MG PO TABS: 4.0000 mg | ORAL_TABLET | ORAL | Status: DC

## 2013-06-16 MED ORDER — TIOTROPIUM BROMIDE MONOHYDRATE 18 MCG IN CAPS
18.0000 ug | ORAL_CAPSULE | Freq: Every day | RESPIRATORY_TRACT | Status: DC
  Administered 2013-06-17 – 2013-06-24 (×8): 18 ug via RESPIRATORY_TRACT
  Filled 2013-06-16 (×2): qty 5

## 2013-06-16 MED ORDER — MONTELUKAST SODIUM 10 MG PO TABS
10.0000 mg | ORAL_TABLET | Freq: Every day | ORAL | Status: DC
  Administered 2013-06-16 – 2013-06-23 (×8): 10 mg via ORAL
  Filled 2013-06-16 (×8): qty 1

## 2013-06-16 MED ORDER — ALLOPURINOL 300 MG PO TABS
300.0000 mg | ORAL_TABLET | Freq: Every day | ORAL | Status: DC
  Administered 2013-06-17 – 2013-06-24 (×8): 300 mg via ORAL
  Filled 2013-06-16 (×8): qty 1

## 2013-06-16 MED ORDER — ALBUTEROL SULFATE (2.5 MG/3ML) 0.083% IN NEBU
2.5000 mg | INHALATION_SOLUTION | Freq: Once | RESPIRATORY_TRACT | Status: AC
  Administered 2013-06-16: 2.5 mg via RESPIRATORY_TRACT
  Filled 2013-06-16: qty 3

## 2013-06-16 MED ORDER — FUROSEMIDE 10 MG/ML IJ SOLN
40.0000 mg | Freq: Once | INTRAMUSCULAR | Status: AC
  Administered 2013-06-16: 40 mg via INTRAVENOUS
  Filled 2013-06-16: qty 4

## 2013-06-16 MED ORDER — ONDANSETRON HCL 4 MG/2ML IJ SOLN: 4.0000 mg | Freq: Four times a day (QID) | INTRAMUSCULAR | Status: DC | PRN

## 2013-06-16 MED ORDER — SODIUM CHLORIDE 0.9 % IJ SOLN
3.0000 mL | Freq: Two times a day (BID) | INTRAMUSCULAR | Status: DC
  Administered 2013-06-16 – 2013-06-24 (×16): 3 mL via INTRAVENOUS

## 2013-06-16 MED ORDER — BUDESONIDE-FORMOTEROL FUMARATE 160-4.5 MCG/ACT IN AERO
2.0000 | INHALATION_SPRAY | Freq: Two times a day (BID) | RESPIRATORY_TRACT | Status: DC
  Administered 2013-06-16 – 2013-06-24 (×16): 2 via RESPIRATORY_TRACT
  Filled 2013-06-16: qty 6

## 2013-06-16 MED ORDER — ALBUTEROL SULFATE HFA 108 (90 BASE) MCG/ACT IN AERS
2.0000 | INHALATION_SPRAY | Freq: Four times a day (QID) | RESPIRATORY_TRACT | Status: DC | PRN
  Filled 2013-06-16: qty 6.7

## 2013-06-16 MED ORDER — TRAVOPROST 0.004 % OP SOLN
1.0000 [drp] | Freq: Every day | OPHTHALMIC | Status: DC
  Filled 2013-06-16: qty 5

## 2013-06-16 MED ORDER — IPRATROPIUM-ALBUTEROL 0.5-2.5 (3) MG/3ML IN SOLN
3.0000 mL | Freq: Once | RESPIRATORY_TRACT | Status: AC
  Administered 2013-06-16: 3 mL via RESPIRATORY_TRACT
  Filled 2013-06-16: qty 3

## 2013-06-16 MED ORDER — ONDANSETRON HCL 4 MG PO TABS: 4.0000 mg | ORAL_TABLET | Freq: Four times a day (QID) | ORAL | Status: DC | PRN

## 2013-06-16 MED ORDER — WARFARIN SODIUM 4 MG PO TABS
6.0000 mg | ORAL_TABLET | Freq: Once | ORAL | Status: AC
  Administered 2013-06-16: 6 mg via ORAL
  Filled 2013-06-16: qty 3
  Filled 2013-06-16: qty 1.5

## 2013-06-16 MED ORDER — METOPROLOL TARTRATE 50 MG PO TABS
50.0000 mg | ORAL_TABLET | Freq: Two times a day (BID) | ORAL | Status: DC
  Administered 2013-06-16 – 2013-06-24 (×16): 50 mg via ORAL
  Filled 2013-06-16 (×16): qty 1

## 2013-06-16 MED ORDER — TIOTROPIUM BROMIDE MONOHYDRATE 18 MCG IN CAPS
ORAL_CAPSULE | RESPIRATORY_TRACT | Status: AC
  Filled 2013-06-16: qty 5

## 2013-06-16 MED ORDER — POTASSIUM CHLORIDE CRYS ER 10 MEQ PO TBCR
40.0000 meq | EXTENDED_RELEASE_TABLET | Freq: Two times a day (BID) | ORAL | Status: DC
  Administered 2013-06-16 – 2013-06-18 (×5): 40 meq via ORAL
  Filled 2013-06-16 (×9): qty 4

## 2013-06-16 MED ORDER — BRIMONIDINE TARTRATE 0.2 % OP SOLN
1.0000 [drp] | Freq: Two times a day (BID) | OPHTHALMIC | Status: DC
  Administered 2013-06-16 – 2013-06-24 (×16): 1 [drp] via OPHTHALMIC
  Filled 2013-06-16: qty 5

## 2013-06-16 MED ORDER — ALBUTEROL SULFATE (2.5 MG/3ML) 0.083% IN NEBU
3.0000 mL | INHALATION_SOLUTION | Freq: Four times a day (QID) | RESPIRATORY_TRACT | Status: DC | PRN
  Administered 2013-06-16 – 2013-06-21 (×2): 3 mL via RESPIRATORY_TRACT
  Filled 2013-06-16 (×2): qty 3

## 2013-06-16 MED ORDER — MIRTAZAPINE 15 MG PO TBDP
ORAL_TABLET | ORAL | Status: AC
  Filled 2013-06-16: qty 1

## 2013-06-16 MED ORDER — TEMAZEPAM 15 MG PO CAPS
15.0000 mg | ORAL_CAPSULE | Freq: Every evening | ORAL | Status: DC | PRN
  Administered 2013-06-20 – 2013-06-22 (×3): 15 mg via ORAL
  Filled 2013-06-16 (×3): qty 1

## 2013-06-16 MED ORDER — TIMOLOL MALEATE 0.5 % OP SOLN
1.0000 [drp] | Freq: Two times a day (BID) | OPHTHALMIC | Status: DC
  Administered 2013-06-16 – 2013-06-24 (×16): 1 [drp] via OPHTHALMIC
  Filled 2013-06-16 (×2): qty 5

## 2013-06-16 MED ORDER — FUROSEMIDE 10 MG/ML IJ SOLN
80.0000 mg | Freq: Two times a day (BID) | INTRAMUSCULAR | Status: DC
  Administered 2013-06-17 – 2013-06-21 (×9): 80 mg via INTRAVENOUS
  Filled 2013-06-16 (×9): qty 8

## 2013-06-16 MED ORDER — WARFARIN - PHARMACIST DOSING INPATIENT
Status: DC
  Administered 2013-06-17 – 2013-06-20 (×3)

## 2013-06-16 MED ORDER — BRIMONIDINE TARTRATE-TIMOLOL 0.2-0.5 % OP SOLN: 1.0000 [drp] | Freq: Two times a day (BID) | OPHTHALMIC | Status: DC

## 2013-06-16 MED ORDER — BUDESONIDE-FORMOTEROL FUMARATE 160-4.5 MCG/ACT IN AERO
INHALATION_SPRAY | RESPIRATORY_TRACT | Status: AC
  Filled 2013-06-16: qty 6

## 2013-06-16 MED ORDER — TRAMADOL HCL 50 MG PO TABS
50.0000 mg | ORAL_TABLET | Freq: Every day | ORAL | Status: DC | PRN
  Administered 2013-06-16: 50 mg via ORAL
  Filled 2013-06-16: qty 1

## 2013-06-16 MED ORDER — ALPRAZOLAM 0.25 MG PO TABS
0.2500 mg | ORAL_TABLET | Freq: Every evening | ORAL | Status: DC | PRN
  Administered 2013-06-16 – 2013-06-23 (×5): 0.25 mg via ORAL
  Filled 2013-06-16 (×5): qty 1

## 2013-06-16 NOTE — ED Notes (Signed)
Patient transported to X-ray 

## 2013-06-16 NOTE — ED Notes (Signed)
MD at bedside. 

## 2013-06-16 NOTE — ED Notes (Signed)
Attempted to call report

## 2013-06-16 NOTE — ED Notes (Signed)
Pt seen in ED last with a fib with RVR, peripheral edema with, presents today with JVD, ShOB, presents with case manager from [facility], Janalyn Shy, phone number [phone]. Really ShOB, using albuterol PRN, has gone through one rescue inhaler in two weeks.

## 2013-06-16 NOTE — ED Notes (Signed)
Pt returned from xray via stretcher.

## 2013-06-16 NOTE — H&P (Signed)
[facility] History and Physical  Theodore [name] [id number]:[id number] DOB: [birth year] DOA: 06/16/2013  Referring physician: ER. PCP: Tula Nakayama, MD   Chief Complaint: Dyspnea, bilateral leg swelling.  HPI: Theodore [name] is a 73 y.o. male  This 73 year old man has a history of diastolic congestive heart failure previously and now presents with a one-week history of bilateral leg swelling and increasing dyspnea. He denies any specific cough, fever. He does have a history of COPD. He denies any chest pain. When he was evaluated in the emergency room, it was felt that he was in congestive heart failure and he is now being admitted for further management. Echocardiogram was previously done just over one year ago which showed ejection fraction of 50-55% but also severely dilated left and right atria.   Review of Systems:  Constitutional:  No weight loss, night sweats, Fevers, chills, fatigue.  HEENT:  No headaches, Difficulty swallowing,Tooth/dental problems,Sore throat,  No sneezing, itching, ear ache, nasal congestion, post nasal drip,  Cardio-vascular:  No chest pain, Orthopnea, PND, anasarca, dizziness, palpitations  GI:  No heartburn, indigestion, abdominal pain, nausea, vomiting, diarrhea, change in bowel habits, loss of appetite  Resp:  . No excess mucus, no productive cough, No non-productive cough, No coughing up of blood.No change in color of mucus.No wheezing.No chest wall deformity  Skin:  no rash or lesions.  GU:  no dysuria, change in color of urine, no urgency or frequency. No flank pain.  Musculoskeletal:  No joint pain or swelling. No decreased range of motion. No back pain.  Psych:  No change in mood or affect. No depression or anxiety. No memory loss.   Past Medical History  Diagnosis Date  . Anemia     Hemoglobin 11.6 in 04/2008->resolved   . Edema     Lower Extremity  . Tobacco abuse   . Glaucoma   . Allergic rhinitis   . Atrial fibrillation    Coumadin;asymptomatic bradycardia on telemetry in 12/2009  . CHF (congestive heart failure)     H/o CHF with preserved LV EF; pulmonary hypertension; normal EF in 03/2009  . Hypertension   . Shingles   . Shortness of breath   . COPD (chronic obstructive pulmonary disease)   . Asthma    Past Surgical History  Procedure Laterality Date  . Bilateral cataract surgery    . Herniorrhapy    . Tendon repair      right hand surgical procedure for a tendon repair  . Colonoscopy N/A 11/29/2012    Procedure: COLONOSCOPY;  Surgeon: Danie Binder, MD;  Location: [facility];  Service: Endoscopy;  Laterality: N/A;  1:00  . Esophagogastroduodenoscopy N/A 11/29/2012    Procedure: ESOPHAGOGASTRODUODENOSCOPY (EGD);  Surgeon: Danie Binder, MD;  Location: [facility];  Service: Endoscopy;  Laterality: N/A;  . Eye surgery    . Hernia repair     Social History:  reports that he has been smoking Cigarettes.  He has a 11 pack-year smoking history. He has never used smokeless tobacco. He reports that he does not drink alcohol or use illicit drugs.  Allergies  Allergen Reactions  . Aspirin     On coumadin    Family History  Problem Relation Age of Onset  . Cancer Mother   . Cancer Father   . Coronary artery disease Brother   . Colon cancer Brother     and possibly father? Patient is unsure     Prior to Admission medications   Medication  Sig Start Date End Date Taking? Authorizing Provider  albuterol (PROAIR HFA) 108 (90 BASE) MCG/ACT inhaler Inhale 2 puffs into the lungs every 6 (six) hours as needed for wheezing or shortness of breath. 05/23/13   Fayrene Helper, MD  albuterol (PROVENTIL) (2.5 MG/3ML) 0.083% nebulizer solution INHALE 1 VIAL VIA NEBULIZER EVERY 6 HOURS AS NEEDED 05/15/13   Fayrene Helper, MD  allopurinol (ZYLOPRIM) 300 MG tablet Take 1 tablet (300 mg total) by mouth daily. 05/23/13   Fayrene Helper, MD  ALPRAZolam Duanne Moron) 0.25 MG tablet TAKE 1 TABLET BY MOUTH TWICE DAILY  AS NEEDED 05/21/13   Fayrene Helper, MD  brimonidine-timolol (COMBIGAN) 0.2-0.5 % ophthalmic solution Place 1 drop into both eyes every 12 (twelve) hours.      Historical Provider, MD  budesonide-formoterol (SYMBICORT) 160-4.5 MCG/ACT inhaler Inhale 2 puffs into the lungs 2 (two) times daily. 05/23/13   Fayrene Helper, MD  CARTIA XT 120 MG 24 hr capsule Take 120 mg by mouth daily. 05/17/13   Historical Provider, MD  furosemide (LASIX) 20 MG tablet Take 2 tablets (40 mg total) by mouth 2 (two) times daily. 06/13/13   Tanna Furry, MD  furosemide (LASIX) 40 MG tablet Take 1 tablet (40 mg total) by mouth daily. 02/12/13   Imogene Burn, PA-C  metoprolol (LOPRESSOR) 50 MG tablet Take 1 tablet (50 mg total) by mouth 2 (two) times daily. 04/04/13   Thurnell Lose, MD  mirtazapine (REMERON) 7.5 MG tablet Take 1 tablet (7.5 mg total) by mouth at bedtime. 05/21/13   Fayrene Helper, MD  montelukast (SINGULAIR) 10 MG tablet Take 10 mg by mouth at bedtime.    Historical Provider, MD  potassium chloride (K-DUR) 10 MEQ tablet Take 4 tablets (40 mEq total) by mouth 2 (two) times daily. 06/13/13   Tanna Furry, MD  potassium chloride (KLOR-CON 10) 10 MEQ tablet Take 1 tablet (10 mEq total) by mouth 2 (two) times daily. 05/23/13   Fayrene Helper, MD  temazepam (RESTORIL) 15 MG capsule Take 15 mg by mouth at bedtime as needed. 04/17/13   Historical Provider, MD  tiotropium (SPIRIVA) 18 MCG inhalation capsule Place 18 mcg into inhaler and inhale daily.    Historical Provider, MD  traMADol (ULTRAM) 50 MG tablet Take 1 tablet (50 mg total) by mouth daily as needed. 05/23/13 05/23/14  Fayrene Helper, MD  travoprost, benzalkonium, (TRAVATAN Z) 0.004 % ophthalmic solution Place 1 drop into both eyes at bedtime.     Historical Provider, MD  warfarin (COUMADIN) 4 MG tablet Take 4-6 mg by mouth as directed.  05/15/13   Historical Provider, MD   Physical Exam: Filed Vitals:   06/16/13 1816  BP: 152/86  Pulse: 99  Temp:    Resp: 24    BP 152/86  Pulse 99  Temp(Src) 98.2 F (36.8 C) (Oral)  Resp 24  Ht 5\' 6"  (1.676 m)  Wt 66.735 kg (147 lb 2 oz)  BMI 23.76 kg/m2  SpO2 98%  General:  Appears calm and comfortable. Eyes: PERRL, normal lids, irises & conjunctiva ENT: grossly normal hearing, lips & tongue Neck: no LAD, masses or thyromegaly Cardiovascular: RRR, no m/r/g. Bilateral pitting edema up to the knees. Telemetry: SR, no arrhythmias  Respiratory: Bilateral inspiratory crackles in both mid and lower zones. No wheezing. Abdomen: soft, ntnd Skin: no rash or induration seen on limited exam Musculoskeletal: grossly normal tone BUE/BLE Psychiatric: grossly normal mood and affect, speech fluent and appropriate Neurologic:  grossly non-focal.          Labs on Admission:  Basic Metabolic Panel:  Recent Labs Lab 06/13/13 1113 06/16/13 1554  NA 142 145  K 3.9 3.4*  CL 99 98  CO2 30 32  GLUCOSE 110* 107*  BUN 20 18  CREATININE 1.12 1.09  CALCIUM 9.2 9.1   Liver Function Tests:  Recent Labs Lab 06/16/13 1600  AST 28  ALT 15  ALKPHOS 107  BILITOT 0.5  PROT 7.9  ALBUMIN 3.3*   No results found for this basename: LIPASE, AMYLASE,  in the last 168 hours No results found for this basename: AMMONIA,  in the last 168 hours CBC:  Recent Labs Lab 06/13/13 1113 06/16/13 1554  WBC 7.4 9.7  NEUTROABS 6.0 7.0  HGB 9.4* 9.3*  HCT 30.8* 30.4*  MCV 91.4 91.0  PLT 323 359   Cardiac Enzymes:  Recent Labs Lab 06/13/13 1113 06/16/13 1554  TROPONINI <0.30 <0.30    BNP (last 3 results)  Recent Labs  03/17/13 1835 06/13/13 1113 06/16/13 1554  PROBNP 3811.0* 5155.0* 3560.0*      Radiological Exams on Admission: Dg Chest 2 View  06/16/2013   CLINICAL DATA:  Tachycardia.  Shortness of breath.  Chest pain.  EXAM: CHEST  2 VIEW  COMPARISON:  DG CHEST 2 VIEW dated 06/13/2013  FINDINGS: Mediastinum normal. Cardiomegaly with mild pulmonary vascular prominence noted. No pleural  effusion. No pulmonary infiltrate. No pneumothorax. No acute osseus abnormality. Degenerative changes thoracic spine.  IMPRESSION: Stable cardiomegaly and pulmonary venous congestion. No evidence of overt pulmonary edema. Exam stable from prior study.   Electronically Signed   By: Marcello Moores  Register   On: 06/16/2013 16:59    EKG: Independently reviewed. Atrophic fibrillation with no acute ST-T wave changes.  Assessment/Plan   1. Acute diastolic congestive heart failure. 2. COPD, stable. 3. Hypertension. 4. Atrial fibrillation with elevated ventricular rate. On chronic Coumadin therapy.  Plan: 1. Admit to telemetry. 2. Intravenous diuresis with Lasix. 3. Controlled ventricular rate. 4. Echocardiogram. 5. Cardiology consultation. 6. Serial cardiac enzymes.  Further recommendations will depend on patient's hospital progress.  Code Status: Full code.  Family Communication: Discussed  plan with patient at the bedside  Disposition Plan: Home when medically stable.   Time spent: 45 minutes.  Doree Albee [facility] Pager [phone].

## 2013-06-16 NOTE — ED Notes (Signed)
Respiratory therapist at bedside for breathing treatment.

## 2013-06-16 NOTE — ED Notes (Signed)
Internal Medicine MD at bedside for evaluation.

## 2013-06-16 NOTE — ED Notes (Signed)
Respiratory paged for breathing treatment #1

## 2013-06-16 NOTE — Progress Notes (Signed)
ANTICOAGULATION CONSULT NOTE - Initial Consult  Pharmacy Consult for Coumadin Indication: atrial fibrillation  Allergies  Allergen Reactions  . Aspirin     On coumadin    Patient Measurements: Height: 5\' 6"  (167.6 cm) Weight: 147 lb 2 oz (66.735 kg) IBW/kg (Calculated) : 63.8 Heparin Dosing Weight:   Vital Signs: Temp: 98.2 F (36.8 C) (04/06 1509) Temp src: Oral (04/06 1509) BP: 152/86 mmHg (04/06 1832) Pulse Rate: 117 (04/06 1832)  Labs:  Recent Labs  06/16/13 1550 06/16/13 1554  HGB  --  9.3*  HCT  --  30.4*  PLT  --  359  LABPROT 21.2*  --   INR 1.90*  --   CREATININE  --  1.09  TROPONINI  --  <0.30    Estimated Creatinine Clearance: 54.5 ml/min (by C-G formula based on Cr of 1.09).   Medical History: Past Medical History  Diagnosis Date  . Anemia     Hemoglobin 11.6 in 04/2008->resolved   . Edema     Lower Extremity  . Tobacco abuse   . Glaucoma   . Allergic rhinitis   . Atrial fibrillation     Coumadin;asymptomatic bradycardia on telemetry in 12/2009  . CHF (congestive heart failure)     H/o CHF with preserved LV EF; pulmonary hypertension; normal EF in 03/2009  . Hypertension   . Shingles   . Shortness of breath   . COPD (chronic obstructive pulmonary disease)   . Asthma     Medications:  Scheduled:  . allopurinol  300 mg Oral Daily  . brimonidine-timolol  1 drop Both Eyes Q12H  . budesonide-formoterol  2 puff Inhalation BID  . diltiazem  120 mg Oral Daily  . furosemide  80 mg Intravenous Q12H  . metoprolol  50 mg Oral BID  . mirtazapine  7.5 mg Oral QHS  . montelukast  10 mg Oral QHS  . potassium chloride  40 mEq Oral BID  . sodium chloride  3 mL Intravenous Q12H  . tiotropium  18 mcg Inhalation Daily  . travoprost (benzalkonium)  1 drop Both Eyes QHS  . warfarin  6 mg Oral Once  . [START ON 06/17/2013] Warfarin - Pharmacist Dosing Inpatient   Does not apply Q24H    Assessment: Continuation of Coumadin from home for atrial  fibrillation Labs reviewed  Goal of Therapy:  INR 2-3 Monitor platelets by anticoagulation protocol: Yes   Plan:  Coumadin 6 mg po tonight, home dose INR/PT daily Labs per protocol  Chriss Czar 06/16/2013,7:09 PM

## 2013-06-17 ENCOUNTER — Encounter (HOSPITAL_COMMUNITY): Payer: Self-pay | Admitting: Cardiology

## 2013-06-17 DIAGNOSIS — I369 Nonrheumatic tricuspid valve disorder, unspecified: Secondary | ICD-10-CM

## 2013-06-17 DIAGNOSIS — J9601 Acute respiratory failure with hypoxia: Secondary | ICD-10-CM

## 2013-06-17 DIAGNOSIS — I5033 Acute on chronic diastolic (congestive) heart failure: Principal | ICD-10-CM

## 2013-06-17 DIAGNOSIS — I2789 Other specified pulmonary heart diseases: Secondary | ICD-10-CM

## 2013-06-17 DIAGNOSIS — I2721 Secondary pulmonary arterial hypertension: Secondary | ICD-10-CM

## 2013-06-17 LAB — CBC
HEMATOCRIT: 29 % — AB (ref 39.0–52.0)
HEMOGLOBIN: 8.6 g/dL — AB (ref 13.0–17.0)
MCH: 27.2 pg (ref 26.0–34.0)
MCHC: 29.7 g/dL — AB (ref 30.0–36.0)
MCV: 91.8 fL (ref 78.0–100.0)
Platelets: 306 10*3/uL (ref 150–400)
RBC: 3.16 MIL/uL — ABNORMAL LOW (ref 4.22–5.81)
RDW: 16.5 % — ABNORMAL HIGH (ref 11.5–15.5)
WBC: 8 10*3/uL (ref 4.0–10.5)

## 2013-06-17 LAB — COMPREHENSIVE METABOLIC PANEL
ALK PHOS: 91 U/L (ref 39–117)
ALT: 11 U/L (ref 0–53)
AST: 20 U/L (ref 0–37)
Albumin: 2.7 g/dL — ABNORMAL LOW (ref 3.5–5.2)
BUN: 17 mg/dL (ref 6–23)
CALCIUM: 8.7 mg/dL (ref 8.4–10.5)
CO2: 34 meq/L — AB (ref 19–32)
Chloride: 101 mEq/L (ref 96–112)
Creatinine, Ser: 1.05 mg/dL (ref 0.50–1.35)
GFR calc Af Amer: 79 mL/min — ABNORMAL LOW (ref >90)
GFR calc non Af Amer: 68 mL/min — ABNORMAL LOW (ref >90)
Glucose, Bld: 87 mg/dL (ref 70–99)
POTASSIUM: 4.1 meq/L (ref 3.7–5.3)
SODIUM: 143 meq/L (ref 137–147)
Total Bilirubin: 0.4 mg/dL (ref 0.3–1.2)
Total Protein: 6.8 g/dL (ref 6.0–8.3)

## 2013-06-17 LAB — TROPONIN I
Troponin I: <0.3 ng/mL (ref <0.30)
Troponin I: <0.3 ng/mL (ref <0.30)

## 2013-06-17 LAB — PROTIME-INR
INR: 2.34 — ABNORMAL HIGH (ref 0.00–1.49)
PROTHROMBIN TIME: 24.9 s — AB (ref 11.6–15.2)

## 2013-06-17 LAB — PRO B NATRIURETIC PEPTIDE: Pro B Natriuretic peptide (BNP): 3222 pg/mL — ABNORMAL HIGH (ref 0–125)

## 2013-06-17 LAB — TSH: TSH: 2.43 u[IU]/mL (ref 0.350–4.500)

## 2013-06-17 MED ORDER — LIVING BETTER WITH HEART FAILURE BOOK
Freq: Once | Status: AC
  Administered 2013-06-17: 13:00:00
  Filled 2013-06-17: qty 1

## 2013-06-17 MED ORDER — LATANOPROST 0.005 % OP SOLN
1.0000 [drp] | Freq: Every day | OPHTHALMIC | Status: DC
  Administered 2013-06-18 – 2013-06-23 (×6): 1 [drp] via OPHTHALMIC
  Filled 2013-06-17: qty 2.5

## 2013-06-17 MED ORDER — DILTIAZEM HCL ER COATED BEADS 180 MG PO CP24
180.0000 mg | ORAL_CAPSULE | Freq: Every day | ORAL | Status: DC
  Administered 2013-06-18 – 2013-06-24 (×6): 180 mg via ORAL
  Filled 2013-06-17 (×7): qty 1

## 2013-06-17 MED ORDER — ALBUTEROL SULFATE (2.5 MG/3ML) 0.083% IN NEBU
2.5000 mg | INHALATION_SOLUTION | Freq: Two times a day (BID) | RESPIRATORY_TRACT | Status: DC
  Administered 2013-06-17 – 2013-06-24 (×15): 2.5 mg via RESPIRATORY_TRACT
  Filled 2013-06-17 (×15): qty 3

## 2013-06-17 MED ORDER — WARFARIN SODIUM 2 MG PO TABS
4.0000 mg | ORAL_TABLET | Freq: Once | ORAL | Status: AC
  Administered 2013-06-17: 4 mg via ORAL
  Filled 2013-06-17: qty 2

## 2013-06-17 NOTE — Care Management Note (Addendum)
    Page 1 of 2   06/24/2013     11:05:22 AM   CARE MANAGEMENT NOTE 06/24/2013  Patient:  Theodore [name], Theodore [name]   Account Number:  [id number]  Date Initiated:  06/17/2013  Documentation initiated by:  Claretha Cooper  Subjective/Objective Assessment:   Pt lives at home with his 74 yo son Beverely Low. Pt recently lost his wife and Per [facility] CSW, has not been compliant with his own medical treatment. Followed by [facility] RN and CSW.     Action/Plan:   Anticipated DC Date:     Anticipated DC Plan:  [place]  In-house referral  [place]  CM consult      Choice offered to / List presented to:  C-1 Patient   DME arranged  [facility]      DME agency  [facility].        Status of service:  Completed, signed off Medicare Important Message given?  YES (If response is "NO", the following Medicare IM given date fields will be blank) Date Medicare IM given:  06/24/2013 Date Additional Medicare IM given:    Discharge Disposition:  [place]  Per UR Regulation:    If discussed at Long Length of Stay Meetings, dates discussed:    Comments:  06/24/13 Claretha Cooper RN BSN CM Pt decided today that he would go home with O2. CM checked with two DME agencies who stated that the O2 and pt needed to be 6-8 ft, the other agency 15 ft away from his karosene heater. Pt states that is workable in his home. Pt selected [facility] for O2  Emma with [facility] delivering O2 canaster around noon. Alicia with [facility] updated.  06/23/13 Masato Pettie RN BSN CM Pt's O2 sats required DC with O2. Pt adamantly refuses to DC without O2. Choices given by Dr. Sheran Fava to DC with O2 or DC without O2 on Hospice care. Hospice explained numerous times to pt. He chose to DC without O2 and with Hospice services for comfort care. Alisa with [facility] notified.  06/20/13 Karishma Unrein RN BSN CM Pt again declined [facility]. States [facility] takes good care of him.  06/18/13 Claretha Cooper RN BSN CM Pt is followed by [facility].  Pt declines HH RN. PTA, not of O2 and pt states he does not want to go home with it. Will follow  06/17/13 Claretha Cooper RN BSN CM

## 2013-06-17 NOTE — Progress Notes (Signed)
[facility] PROGRESS NOTE  Theodore [name] EXH:[id number] DOB: [birth year] DOA: 06/16/2013 PCP: Tula Nakayama, MD  Assessment/Plan  Acute hypoxic respiratory failure secondary to diastolic heart failure exacerbation with underlying severe pulmonary hypertension but likely reflects left-sided pressures.  Pro-BNP trending down.   -  Continue telemetry, atrial fibrillation, rate controlled -  Daily weights, strict ins and outs -  Troponins negative -  Continue Lasix 80 mg IV twice a day -  Wean oxygen as tolerated  Chronic atrial fibrillation, rate controlled -  Continue metoprolol and diltiazem -  INR therapeutic on warfarin, despite pharmacy  Chronic COPD, no change in cough, amount or quality of sputum -  Continue Symbicort and Spiriva -  Continue albuterol when necessary  Gout, stable, continue allopurinol  Cigarette nicotine abuse and dependence with withdrawal -  Counseled cessation -  Declined nicotine patch  Normocytic anemia with declining hemoglobin -  Transfuse for hemoglobin less than 7 -  Iron studies, B12, folate, TSH -  Occult stool -  Globulin gap is present, check SPEP, UPEP, IFE  Diet:  2 g sodium Access:  PIV IVF:  Off Proph:  Warfarin  Code Status: Full Family Communication: Patient alone Disposition Plan: Pending further diuresis   Consultants:  Cardiology  Procedures:  ECHO  Antibiotics:  None   HPI/Subjective:  Patient states that he had an episode of substernal chest pressure which lasted for approximately 1 hour this morning. It was associated with some shortness of breath but not with diaphoresis or nausea. His legs are still swollen, however he has been voiding frequently since this morning.  Objective: Filed Vitals:   06/16/13 2212 06/16/13 2234 06/17/13 0640 06/17/13 0656  BP:  147/76 113/85   Pulse: 98 110 91   Temp:  97.5 F (36.4 C) 97.6 F (36.4 C)   TempSrc:  Axillary Oral   Resp: 20 24 20    Height:       Weight:   66.724 kg (147 lb 1.6 oz)   SpO2: 99% 97% 100% 96%    Intake/Output Summary (Last 24 hours) at 06/17/13 1243 Last data filed at 06/17/13 1238  Gross per 24 hour  Intake    723 ml  Output   1600 ml  Net   -877 ml   Filed Weights   06/16/13 1509 06/16/13 1935 06/17/13 0640  Weight: 66.735 kg (147 lb 2 oz) 66.5 kg (146 lb 9.7 oz) 66.724 kg (147 lb 1.6 oz)    Exam:   General:  African American male, No acute distress  HEENT:  NCAT, MMM  Cardiovascular:  RRR, nl S1, S2 no mrg, 2+ pulses, warm extremities  Respiratory:  Diminished bilateral breath sounds, faint rales at the bases, no rhonchi, no increased WOB  Abdomen:   NABS, soft, NT/ND  MSK:   Normal tone and bulk, 1+ slow pitting LEE  Neuro:  Grossly intact  Data Reviewed: Basic Metabolic Panel:  Recent Labs Lab 06/13/13 1113 06/16/13 1554 06/17/13 0537  NA 142 145 143  K 3.9 3.4* 4.1  CL 99 98 101  CO2 30 32 34*  GLUCOSE 110* 107* 87  BUN 20 18 17   CREATININE 1.12 1.09 1.05  CALCIUM 9.2 9.1 8.7   Liver Function Tests:  Recent Labs Lab 06/16/13 1600 06/17/13 0537  AST 28 20  ALT 15 11  ALKPHOS 107 91  BILITOT 0.5 0.4  PROT 7.9 6.8  ALBUMIN 3.3* 2.7*   No results found for this basename: LIPASE, AMYLASE,  in  the last 168 hours No results found for this basename: AMMONIA,  in the last 168 hours CBC:  Recent Labs Lab 06/13/13 1113 06/16/13 1554 06/17/13 0537  WBC 7.4 9.7 8.0  NEUTROABS 6.0 7.0  --   HGB 9.4* 9.3* 8.6*  HCT 30.8* 30.4* 29.0*  MCV 91.4 91.0 91.8  PLT 323 359 306   Cardiac Enzymes:  Recent Labs Lab 06/13/13 1113 06/16/13 1554 06/16/13 1848 06/17/13 0036 06/17/13 0537  TROPONINI <0.30 <0.30 <0.30 <0.30 <0.30   BNP (last 3 results)  Recent Labs  06/13/13 1113 06/16/13 1554 06/17/13 0537  PROBNP 5155.0* 3560.0* 3222.0*   CBG: No results found for this basename: GLUCAP,  in the last 168 hours  No results found for this or any previous visit (from  the past 240 hour(s)).   Studies: Dg Chest 2 View  06/16/2013   CLINICAL DATA:  Tachycardia.  Shortness of breath.  Chest pain.  EXAM: CHEST  2 VIEW  COMPARISON:  DG CHEST 2 VIEW dated 06/13/2013  FINDINGS: Mediastinum normal. Cardiomegaly with mild pulmonary vascular prominence noted. No pleural effusion. No pulmonary infiltrate. No pneumothorax. No acute osseus abnormality. Degenerative changes thoracic spine.  IMPRESSION: Stable cardiomegaly and pulmonary venous congestion. No evidence of overt pulmonary edema. Exam stable from prior study.   Electronically Signed   By: Marcello Moores  Register   On: 06/16/2013 16:59    Scheduled Meds: . albuterol  2.5 mg Nebulization BID  . allopurinol  300 mg Oral Daily  . brimonidine  1 drop Both Eyes BID   And  . timolol  1 drop Both Eyes BID  . budesonide-formoterol  2 puff Inhalation BID  . [START ON 06/18/2013] diltiazem  180 mg Oral Daily  . furosemide  80 mg Intravenous Q12H  . latanoprost  1 drop Both Eyes QHS  . metoprolol  50 mg Oral BID  . mirtazapine  7.5 mg Oral QHS  . montelukast  10 mg Oral QHS  . potassium chloride  40 mEq Oral BID  . sodium chloride  3 mL Intravenous Q12H  . tiotropium  18 mcg Inhalation Daily  . Warfarin - Pharmacist Dosing Inpatient   Does not apply Q24H   Continuous Infusions:   Active Problems:   HYPERTENSION   Atrial fibrillation   COPD (chronic obstructive pulmonary disease)   CHF (congestive heart failure)   Acute on chronic diastolic heart failure   Pulmonary arterial hypertension    Time spent: 30 min    Imogene Gravelle, [place] Pager [phone]. If 7PM-7AM, please contact night-coverage at [email or web address], password [facility] 06/17/2013, 12:43 PM  LOS: 1 day

## 2013-06-17 NOTE — Progress Notes (Signed)
*  PRELIMINARY RESULTS* Echocardiogram 2D Echocardiogram has been performed.  [place], [place] 06/17/2013, 10:29 AM

## 2013-06-17 NOTE — Progress Notes (Signed)
ANTICOAGULATION CONSULT NOTE - follow up  Pharmacy Consult for Coumadin Indication: atrial fibrillation  Allergies  Allergen Reactions  . Aspirin     On coumadin   Patient Measurements: Height: 5\' 6"  (167.6 cm) Weight: 147 lb 1.6 oz (66.724 kg) IBW/kg (Calculated) : 63.8   Vital Signs: Temp: 97.6 F (36.4 C) (04/07 0640) Temp src: Oral (04/07 0640) BP: 113/85 mmHg (04/07 0640) Pulse Rate: 91 (04/07 0640)  Labs:  Recent Labs  06/16/13 1550  06/16/13 1554 06/16/13 1848 06/17/13 0036 06/17/13 0537  HGB  --   --  9.3*  --   --  8.6*  HCT  --   --  30.4*  --   --  29.0*  PLT  --   --  359  --   --  306  LABPROT 21.2*  --   --   --   --  24.9*  INR 1.90*  --   --   --   --  2.34*  CREATININE  --   --  1.09  --   --  1.05  TROPONINI  --   < > <0.30 <0.30 <0.30 <0.30  < > = values in this interval not displayed.  Estimated Creatinine Clearance: 56.5 ml/min (by C-G formula based on Cr of 1.05).  Medical History: Past Medical History  Diagnosis Date  . Anemia     Hemoglobin 11.6 in 04/2008 -> resolved   . Tobacco abuse   . Glaucoma   . Allergic rhinitis   . Atrial fibrillation     Coumadin  . Diastolic heart failure     LVEF 62-70%, rate 2 diastolic dysfunction 05/5007  . Pulmonary hypertension     70 mmHg 03/2012  . Shingles   . Essential hypertension, benign   . COPD (chronic obstructive pulmonary disease)   . Asthma    Medications:  Scheduled:  . albuterol  2.5 mg Nebulization BID  . allopurinol  300 mg Oral Daily  . brimonidine  1 drop Both Eyes BID   And  . timolol  1 drop Both Eyes BID  . budesonide-formoterol  2 puff Inhalation BID  . [START ON 06/18/2013] diltiazem  180 mg Oral Daily  . furosemide  80 mg Intravenous Q12H  . latanoprost  1 drop Both Eyes QHS  . Living Better with Heart Failure Book   Does not apply Once  . metoprolol  50 mg Oral BID  . mirtazapine  7.5 mg Oral QHS  . montelukast  10 mg Oral QHS  . potassium chloride  40 mEq Oral BID   . sodium chloride  3 mL Intravenous Q12H  . tiotropium  18 mcg Inhalation Daily  . Warfarin - Pharmacist Dosing Inpatient   Does not apply Q24H   Assessment: Continuation of Coumadin from home for atrial fibrillation Home Regimen- reportedly has 4mg  tabs :  Patient takes 1 1/2 tablets on Thursdays, Saturdays, Mondays and Wednesdays. Patient takes 1 tablet on Fridays, Sundays, Tuesdays INR now therapeutic.  Goal of Therapy:  INR 2-3 Monitor platelets by anticoagulation protocol: Yes   Plan:  Coumadin 4mg  po today (home dose) INR/PT daily Labs per protocol  Hart Robinsons A 06/17/2013,1:52 PM

## 2013-06-17 NOTE — Consult Note (Signed)
Primary cardiologist: Establishing with Dr. Kate Sable Consulting cardiologist: Dr. Satira Sark  Clinical Summary Theodore [name] is a 73 y.o.male with past medical history outlined below, most recently seen in the office by Ms. Lawrence NP in January of this year. He is now admitted with recent worsening leg edema, increased shortness of breath, also feeling of rapid heart rate. He reports compliance with his medications.  Chest x-ray shows pulmonary venous congestion with no obvious edema. Cardiac markers argue against ACS. Pro BNP is elevated in the range of 3000-5000. He did have atrial fibrillation with rapid ventricular response at presentation, heart rate improving. We are asked to assist with his management.  Last echocardiogram from January 2014 is noted below. LVEF was low normal with grade 2 diastolic dysfunction, also evidence of severe pulmonary hypertension which is likely also contributing to symptoms.   Allergies  Allergen Reactions  . Aspirin     On coumadin    Medications Scheduled Medications: . albuterol  2.5 mg Nebulization BID  . allopurinol  300 mg Oral Daily  . brimonidine  1 drop Both Eyes BID   And  . timolol  1 drop Both Eyes BID  . budesonide-formoterol  2 puff Inhalation BID  . diltiazem  120 mg Oral Daily  . furosemide  80 mg Intravenous Q12H  . latanoprost  1 drop Both Eyes QHS  . metoprolol  50 mg Oral BID  . mirtazapine  7.5 mg Oral QHS  . montelukast  10 mg Oral QHS  . potassium chloride  40 mEq Oral BID  . sodium chloride  3 mL Intravenous Q12H  . tiotropium  18 mcg Inhalation Daily  . Warfarin - Pharmacist Dosing Inpatient   Does not apply Q24H    PRN Medications: albuterol, ALPRAZolam, ondansetron (ZOFRAN) IV, ondansetron, temazepam, traMADol   Past Medical History  Diagnosis Date  . Anemia     Hemoglobin 11.6 in 04/2008 -> resolved   . Tobacco abuse   . Glaucoma   . Allergic rhinitis   . Atrial fibrillation    Coumadin  . Diastolic heart failure     LVEF 40-08%, rate 2 diastolic dysfunction 08/7617  . Pulmonary hypertension     70 mmHg 03/2012  . Shingles   . Essential hypertension, benign   . COPD (chronic obstructive pulmonary disease)   . Asthma     Past Surgical History  Procedure Laterality Date  . Bilateral cataract surgery    . Herniorrhapy    . Tendon repair      Right hand surgical procedure for a tendon repair  . Colonoscopy N/A 11/29/2012    Procedure: COLONOSCOPY;  Surgeon: Danie Binder, MD;  Location: [facility];  Service: Endoscopy;  Laterality: N/A;  1:00  . Esophagogastroduodenoscopy N/A 11/29/2012    Procedure: ESOPHAGOGASTRODUODENOSCOPY (EGD);  Surgeon: Danie Binder, MD;  Location: [facility];  Service: Endoscopy;  Laterality: N/A;  . Eye surgery    . Hernia repair      Family History  Problem Relation Age of Onset  . Cancer Mother   . Cancer Father   . Coronary artery disease Brother   . Colon cancer Brother     Social History Theodore [name] reports that he has been smoking Cigarettes.  He has a 11 pack-year smoking history. He has never used smokeless tobacco. Theodore [name] reports that he does not drink alcohol.  Review of Systems No recent fevers or chills, no cough. Occasional wheezing. No  orthopnea. Stable appetite. No chest pain. Otherwise as outlined  Physical Examination Blood pressure 113/85, pulse 91, temperature 97.6 F (36.4 C), temperature source Oral, resp. rate 20, height 5\' 6"  (1.676 m), weight 147 lb 1.6 oz (66.724 kg), SpO2 96.00%.  Intake/Output Summary (Last 24 hours) at 06/17/13 1148 Last data filed at 06/17/13 1107  Gross per 24 hour  Intake    723 ml  Output   1400 ml  Net   -677 ml   Telemetry: Atrial fibrillation with occasional PVCs versus aberrantly conducted complexes.  Chronically ill-appearing male, no distress. HEENT: Conjunctiva and lids normal, oropharynx clear. Neck: Supple, elevated JVP, no carotid bruits, no  thyromegaly. Lungs: Decreased breath sounds throughout, nonlabored breathing at rest. Cardiac: Irregularly irregular, no S3 or significant systolic murmur, no pericardial rub. Abdomen: Soft, nontender, bowel sounds present, no guarding or rebound. Extremities: 2-3+ edema, distal pulses 1-2+. Skin: Warm and dry. Musculoskeletal: No kyphosis. Neuropsychiatric: Alert and oriented x3, affect grossly appropriate.   Lab Results  Basic Metabolic Panel:  Recent Labs Lab 06/13/13 1113 06/16/13 1554 06/17/13 0537  NA 142 145 143  K 3.9 3.4* 4.1  CL 99 98 101  CO2 30 32 34*  GLUCOSE 110* 107* 87  BUN 20 18 17   CREATININE 1.12 1.09 1.05  CALCIUM 9.2 9.1 8.7    Liver Function Tests:  Recent Labs Lab 06/16/13 1600 06/17/13 0537  AST 28 20  ALT 15 11  ALKPHOS 107 91  BILITOT 0.5 0.4  PROT 7.9 6.8  ALBUMIN 3.3* 2.7*    CBC:  Recent Labs Lab 06/13/13 1113 06/16/13 1554 06/17/13 0537  WBC 7.4 9.7 8.0  NEUTROABS 6.0 7.0  --   HGB 9.4* 9.3* 8.6*  HCT 30.8* 30.4* 29.0*  MCV 91.4 91.0 91.8  PLT 323 359 306    Cardiac Enzymes:  Recent Labs Lab 06/13/13 1113 06/16/13 1554 06/16/13 1848 06/17/13 0036 06/17/13 0537  TROPONINI <0.30 <0.30 <0.30 <0.30 <0.30    ECG Atrial fibrillation with left axis deviation, occasional PVCs versus aberrantly conducted complexes, diffuse ST T-wave abnormalities.  Imaging EXAM: CHEST 2 VIEW  COMPARISON: DG CHEST 2 VIEW dated 06/13/2013  FINDINGS: Mediastinum normal. Cardiomegaly with mild pulmonary vascular prominence noted. No pleural effusion. No pulmonary infiltrate. No pneumothorax. No acute osseus abnormality. Degenerative changes thoracic spine.  IMPRESSION: Stable cardiomegaly and pulmonary venous congestion. No evidence of overt pulmonary edema. Exam stable from prior study.   Impression  1. Acute on chronic diastolic heart failure, suspect right-sided component as well with associated severe pulmonary hypertension  based on prior evaluation.  2. Permanent atrial fibrillation, on Coumadin. Elevated heart rates at presentation may also be contributing to problem #1.  3. Severe pulmonary hypertension, PASP 70 mmHg as of January 2014. Only mild mitral regurgitation. Did have grade 2 diastolic dysfunction and biatrial enlargement. Uncertain how severe COPD is at baseline.  4. COPD.  5. Essential hypertension.   Recommendations  Increase Cardizem CD to 180 mg daily, continue Lopressor as long as this is not contributing to COPD  instability. Seems to have tolerated this based on outpatient records. Agree with IV Lasix at 80 mg twice daily for now, continue potassium supplements. Followup echocardiogram is pending for reassessment of cardiac structure and function, also redefinition of pulmonary pressures. Main focus will be on heart rate control of atrial fibrillation, volume management. Would assess ambulatory oxygen saturation to see if he might benefit from supplemental oxygen at home.   Satira Sark, M.D., F.A.C.C.

## 2013-06-18 DIAGNOSIS — D649 Anemia, unspecified: Secondary | ICD-10-CM

## 2013-06-18 DIAGNOSIS — F17200 Nicotine dependence, unspecified, uncomplicated: Secondary | ICD-10-CM

## 2013-06-18 LAB — BASIC METABOLIC PANEL
BUN: 20 mg/dL (ref 6–23)
CO2: 33 mEq/L — ABNORMAL HIGH (ref 19–32)
Calcium: 9.1 mg/dL (ref 8.4–10.5)
Chloride: 100 mEq/L (ref 96–112)
Creatinine, Ser: 1.24 mg/dL (ref 0.50–1.35)
GFR calc non Af Amer: 56 mL/min — ABNORMAL LOW (ref >90)
GFR, EST AFRICAN AMERICAN: 65 mL/min — AB (ref >90)
Glucose, Bld: 115 mg/dL — ABNORMAL HIGH (ref 70–99)
Potassium: 4.8 mEq/L (ref 3.7–5.3)
Sodium: 142 mEq/L (ref 137–147)

## 2013-06-18 LAB — CBC
HCT: 30.4 % — ABNORMAL LOW (ref 39.0–52.0)
Hemoglobin: 8.9 g/dL — ABNORMAL LOW (ref 13.0–17.0)
MCH: 27.5 pg (ref 26.0–34.0)
MCHC: 29.3 g/dL — ABNORMAL LOW (ref 30.0–36.0)
MCV: 93.8 fL (ref 78.0–100.0)
Platelets: 317 10*3/uL (ref 150–400)
RBC: 3.24 MIL/uL — ABNORMAL LOW (ref 4.22–5.81)
RDW: 16.5 % — ABNORMAL HIGH (ref 11.5–15.5)
WBC: 6.6 10*3/uL (ref 4.0–10.5)

## 2013-06-18 LAB — TSH: TSH: 2.58 u[IU]/mL (ref 0.350–4.500)

## 2013-06-18 LAB — MAGNESIUM: Magnesium: 1.8 mg/dL (ref 1.5–2.5)

## 2013-06-18 LAB — TRANSFERRIN: TRANSFERRIN: 267 mg/dL (ref 200–360)

## 2013-06-18 LAB — PROTIME-INR
INR: 2.38 — ABNORMAL HIGH (ref 0.00–1.49)
Prothrombin Time: 25.2 seconds — ABNORMAL HIGH (ref 11.6–15.2)

## 2013-06-18 MED ORDER — WARFARIN SODIUM 6 MG PO TABS
6.0000 mg | ORAL_TABLET | Freq: Once | ORAL | Status: AC
  Administered 2013-06-18: 6 mg via ORAL
  Filled 2013-06-18: qty 1

## 2013-06-18 MED ORDER — METOLAZONE 5 MG PO TABS
2.5000 mg | ORAL_TABLET | Freq: Once | ORAL | Status: AC
  Administered 2013-06-18: 2.5 mg via ORAL
  Filled 2013-06-18: qty 1

## 2013-06-18 NOTE — Progress Notes (Signed)
ANTICOAGULATION CONSULT NOTE - follow up  Pharmacy Consult for Coumadin Indication: atrial fibrillation  Allergies  Allergen Reactions  . Aspirin     On coumadin   Patient Measurements: Height: 5\' 6"  (167.6 cm) Weight: 149 lb 12.8 oz (67.949 kg) IBW/kg (Calculated) : 63.8   Vital Signs: Temp: 98.5 F (36.9 C) (04/08 0434) Temp src: Oral (04/08 0434) BP: 99/70 mmHg (04/08 0434) Pulse Rate: 67 (04/08 0434)  Labs:  Recent Labs  06/16/13 1550  06/16/13 1554 06/16/13 1848 06/17/13 0036 06/17/13 0537 06/18/13 0528  HGB  --   < > 9.3*  --   --  8.6* 8.9*  HCT  --   --  30.4*  --   --  29.0* 30.4*  PLT  --   --  359  --   --  306 317  LABPROT 21.2*  --   --   --   --  24.9* 25.2*  INR 1.90*  --   --   --   --  2.34* 2.38*  CREATININE  --   --  1.09  --   --  1.05 1.24  TROPONINI  --   < > <0.30 <0.30 <0.30 <0.30  --   < > = values in this interval not displayed.  Estimated Creatinine Clearance: 47.9 ml/min (by C-G formula based on Cr of 1.24).  Medical History: Past Medical History  Diagnosis Date  . Anemia     Hemoglobin 11.6 in 04/2008 -> resolved   . Tobacco abuse   . Glaucoma   . Allergic rhinitis   . Atrial fibrillation     Coumadin  . Diastolic heart failure     LVEF 81-82%, rate 2 diastolic dysfunction 11/9369  . Pulmonary hypertension     70 mmHg 03/2012  . Shingles   . Essential hypertension, benign   . COPD (chronic obstructive pulmonary disease)   . Asthma    Medications:  Scheduled:  . albuterol  2.5 mg Nebulization BID  . allopurinol  300 mg Oral Daily  . brimonidine  1 drop Both Eyes BID   And  . timolol  1 drop Both Eyes BID  . budesonide-formoterol  2 puff Inhalation BID  . diltiazem  180 mg Oral Daily  . furosemide  80 mg Intravenous Q12H  . latanoprost  1 drop Both Eyes QHS  . metolazone  2.5 mg Oral Once  . metoprolol  50 mg Oral BID  . mirtazapine  7.5 mg Oral QHS  . montelukast  10 mg Oral QHS  . potassium chloride  40 mEq Oral  BID  . sodium chloride  3 mL Intravenous Q12H  . tiotropium  18 mcg Inhalation Daily  . Warfarin - Pharmacist Dosing Inpatient   Does not apply Q24H   Assessment: Continuation of Coumadin from home for atrial fibrillation Home Regimen- reportedly has 4mg  tabs :  Patient takes 1 1/2 tablets on Thursdays, Saturdays, Mondays and Wednesdays. Patient takes 1 tablet on Fridays, Sundays, Tuesdays INR now therapeutic x 2.  Goal of Therapy:  INR 2-3 Monitor platelets by anticoagulation protocol: Yes   Plan:  Coumadin 6mg  po today (home dose) INR/PT daily  Ena Dawley 06/18/2013,2:04 PM

## 2013-06-18 NOTE — Progress Notes (Signed)
[facility] PROGRESS NOTE  Theodore [name] YCX:[id number] DOB: Jul 15, 1940 DOA: 06/16/2013 PCP: Tula Nakayama, MD  Assessment/Plan  Acute hypoxic respiratory failure secondary to diastolic heart failure exacerbation with underlying severe pulmonary hypertension but likely reflects left-sided pressures.  Pro-BNP trending down.   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  Upep/IFE canceled by lab.  Will need to be reordered if spep abnormal  Diet:  2 g sodium Access:  PIV IVF:  Off Proph:  Warfarin  Code Status: Full Family Communication: Patient alone Disposition Plan: Pending further diuresis   Consultants:  Cardiology  Procedures:  ECHO  Antibiotics:  None   HPI/Subjective:  States he continues to have some chest tightness and SOB.  Legs are still swollen  Objective: Filed Vitals:   06/17/13 1720 06/17/13 1954 06/17/13 2223 06/18/13 0434  BP: 110/67  113/66 99/70  Pulse: 74  51 67  Temp:   98 F (36.7 C) 98.5 F (36.9 C)  TempSrc:   Oral Oral  Resp:   20 20  Height:      Weight:    67.949 kg (149 lb 12.8 oz)  SpO2:  99% 100% 97%    Intake/Output Summary  (Last 24 hours) at 06/18/13 1209 Last data filed at 06/18/13 1035  Gross per 24 hour  Intake    600 ml  Output   1700 ml  Net  -1100 ml   Filed Weights   06/16/13 1935 06/17/13 0640 06/18/13 0434  Weight: 66.5 kg (146 lb 9.7 oz) 66.724 kg (147 lb 1.6 oz) 67.949 kg (149 lb 12.8 oz)    Exam:   General:  African American male, No acute distress  HEENT:  NCAT, MMM  Cardiovascular:  RRR, nl S1, S2 no mrg, 2+ pulses, warm extremities  Respiratory:  Diminished bilateral breath sounds, faint rales at the bases, no rhonchi, no increased WOB  Abdomen:   NABS, soft, NT/ND  MSK:   Normal tone and bulk, 1+ slow pitting LEE, stable from yesterday  Neuro:  Grossly intact  Data Reviewed: Basic Metabolic Panel:  Recent Labs Lab 06/13/13 1113 06/16/13 1554 06/17/13 0537 06/18/13 0528  NA 142 145 143 142  K 3.9 3.4* 4.1 4.8  CL 99 98 101 100  CO2 30 32 34* 33*  GLUCOSE 110* 107* 87 115*  BUN 20 18 17 20   CREATININE 1.12 1.09 1.05 1.24  CALCIUM 9.2 9.1 8.7 9.1   Liver Function Tests:  Recent Labs Lab 06/16/13 1600 06/17/13 0537  AST 28 20  ALT 15 11  ALKPHOS 107 91  BILITOT 0.5 0.4  PROT 7.9 6.8  ALBUMIN 3.3* 2.7*   No results found for this basename:  LIPASE, AMYLASE,  in the last 168 hours No results found for this basename: AMMONIA,  in the last 168 hours CBC:  Recent Labs Lab 06/13/13 1113 06/16/13 1554 06/17/13 0537 06/18/13 0528  WBC 7.4 9.7 8.0 6.6  NEUTROABS 6.0 7.0  --   --   HGB 9.4* 9.3* 8.6* 8.9*  HCT 30.8* 30.4* 29.0* 30.4*  MCV 91.4 91.0 91.8 93.8  PLT 323 359 306 317   Cardiac Enzymes:  Recent Labs Lab 06/13/13 1113 06/16/13 1554 06/16/13 1848 06/17/13 0036 06/17/13 0537  TROPONINI <0.30 <0.30 <0.30 <0.30 <0.30   BNP (last 3 results)  Recent Labs  06/13/13 1113 06/16/13 1554 06/17/13 0537  PROBNP 5155.0* 3560.0* 3222.0*   CBG: No results found for this basename: GLUCAP,  in the last 168 hours  No results found for this or  any previous visit (from the past 240 hour(s)).   Studies: Dg Chest 2 View  06/16/2013   CLINICAL DATA:  Tachycardia.  Shortness of breath.  Chest pain.  EXAM: CHEST  2 VIEW  COMPARISON:  DG CHEST 2 VIEW dated 06/13/2013  FINDINGS: Mediastinum normal. Cardiomegaly with mild pulmonary vascular prominence noted. No pleural effusion. No pulmonary infiltrate. No pneumothorax. No acute osseus abnormality. Degenerative changes thoracic spine.  IMPRESSION: Stable cardiomegaly and pulmonary venous congestion. No evidence of overt pulmonary edema. Exam stable from prior study.   Electronically Signed   By: Marcello Moores  Register   On: 06/16/2013 16:59    Scheduled Meds: . albuterol  2.5 mg Nebulization BID  . allopurinol  300 mg Oral Daily  . brimonidine  1 drop Both Eyes BID   And  . timolol  1 drop Both Eyes BID  . budesonide-formoterol  2 puff Inhalation BID  . diltiazem  180 mg Oral Daily  . furosemide  80 mg Intravenous Q12H  . latanoprost  1 drop Both Eyes QHS  . metoprolol  50 mg Oral BID  . mirtazapine  7.5 mg Oral QHS  . montelukast  10 mg Oral QHS  . potassium chloride  40 mEq Oral BID  . sodium chloride  3 mL Intravenous Q12H  . tiotropium  18 mcg Inhalation Daily  . Warfarin - Pharmacist Dosing Inpatient   Does not apply Q24H   Continuous Infusions:   Active Problems:   HYPERTENSION   Atrial fibrillation   COPD (chronic obstructive pulmonary disease)   CHF (congestive heart failure)   Acute on chronic diastolic heart failure   Pulmonary arterial hypertension   Acute respiratory failure with hypoxia    Time spent: 30 min    [facility] Pager [phone]. If 7PM-7AM, please contact night-coverage at [email or web address], password [facility] 06/18/2013, 12:09 PM  LOS: 2 days

## 2013-06-18 NOTE — Progress Notes (Signed)
Initial visit for emotional/spiriutal support. We discussed grief and loss issues as related to his wife's recent death.  Discussed his current support. Will follow up with him tomorrow. Prayed with him also.

## 2013-06-18 NOTE — Progress Notes (Signed)
Subjective:  Breathing a little better.  Objective:  Vital Signs in the last 24 hours: Temp:  [97.7 F (36.5 C)-98.5 F (36.9 C)] 98.5 F (36.9 C) (04/08 0434) Pulse Rate:  [51-74] 67 (04/08 0434) Resp:  [20-24] 20 (04/08 0434) BP: (97-113)/(66-71) 99/70 mmHg (04/08 0434) SpO2:  [84 %-100 %] 97 % (04/08 0434) Weight:  [149 lb 12.8 oz (67.949 kg)] 149 lb 12.8 oz (67.949 kg) (04/08 0434)  Intake/Output from previous day: 04/07 0701 - 04/08 0700 In: 723 [P.O.:720; I.V.:3] Out: 1900 [Urine:1900] Intake/Output from this shift: Total I/O In: -  Out: 600 [Urine:600]  Physical Exam: NECK: Increased JVD, HJR, no bruit LUNGS: Decreased breath sounds throughout with Rales bilaterally right greater than left HEART: Irregular rate and rhythm, 2/6 systolic murmur at the left sternal border, no gallop, rub, bruit, thrill, or heave EXTREMITIES: +1-2 edema bilaterally, otherwise Without cyanosis, clubbing   Lab Results:  Recent Labs  06/17/13 0537 06/18/13 0528  WBC 8.0 6.6  HGB 8.6* 8.9*  PLT 306 317    Recent Labs  06/17/13 0537 06/18/13 0528  NA 143 142  K 4.1 4.8  CL 101 100  CO2 34* 33*  GLUCOSE 87 115*  BUN 17 20  CREATININE 1.05 1.24    Recent Labs  06/17/13 0036 06/17/13 0537  TROPONINI <0.30 <0.30   Hepatic Function Panel  Recent Labs  06/16/13 1600 06/17/13 0537  PROT 7.9 6.8  ALBUMIN 3.3* 2.7*  AST 28 20  ALT 15 11  ALKPHOS 107 91  BILITOT 0.5 0.4  BILIDIR <0.2  --   IBILI NOT CALCULATED  --    No results found for this basename: CHOL,  in the last 72 hours No results found for this basename: PROTIME,  in the last 72 hours  Imaging: 2Decho 06/17/13: Study Conclusions  - Left ventricle: The cavity size was normal. Wall thickness   was increased in a pattern of moderate LVH. Systolic   function was normal. The estimated ejection fraction was   in the range of 50% to 55%. There is hypokinesis of the   basalinferior myocardium. Features are  consistent with a   pseudonormal left ventricular filling pattern, with   concomitant abnormal relaxation and increased filling   pressure (grade 2 diastolic dysfunction). - Aortic valve: Mildly calcified annulus. Trileaflet. - Aortic root: The aortic root was mildly ectatic. - Mitral valve: Calcified annulus. Mild regurgitation. - Left atrium: The atrium was severely dilated. - Right ventricle: The cavity size was mildly to moderately   dilated. Systolic function was moderately reduced. - Right atrium: The atrium wasmarkedly dilated. Central   venous pressure: 76mm Hg (est). - Tricuspid valve: Moderate regurgitation. - Pulmonary arteries: Systolic pressure was severely   increased. PA peak pressure: 96mm Hg (S). - Pericardium, extracardiac: There was no pericardial   effusion. Impressions:  - Moderate LVH with normal LV chamber size and LVEF   approximately 50%, probable grade 2 diastolic dysfunction.   Severe left atrial enlargement. Mild mitral regurgitation.   Moderately reduced RV contraction with moderate tricuspid   regurgitation. Severely elevated PASP 74 mmHg with marked   right atrial enlargement and elevated CVP. MIldly ectatic   aortic root. Transthoracic echocardiography   Cardiac Studies:  Assessment/Plan:  1. Acute on chronic diastolic heart failure, suspect right-sided component as well with associated severe pulmonary hypertension based on prior evaluation.Diuresed 1177 cc. Still a ways to go. On Lasix 80mg  IV BID  2. Permanent atrial fibrillation, on Coumadin. Elevated  heart rates at presentation may also be contributing to problem #1.Now controlled on increased Cardizem 180mg  and lopressor.  3. Severe pulmonary hypertension, PASP 74 mmHg on echo yest. Only mild mitral regurgitation. Did have grade 2 diastolic dysfunction and biatrial enlargement. Uncertain how severe COPD is at baseline.  4. COPD.  5. Essential hypertension.  6.Anemia. HGB 8.9 w/u by  hospitalists.    LOS: 2 days    Theodore [name] 06/18/2013, 9:39 AM  Attending Note Patient seen and discussed with PA Lenze. 73 yo male hx of anemia, afib on coumadin, chronic diastolic heart failure, severe pulm HTN by echo, COPD admitted with SOB,LE edema, and afib with RVR. Repeat echo 06/17/13 shows LVEF 50-55%, hypokinesis of the basalinferior walls, grade II diastolic dysfunction, moderate RV dysfunction, PASP 74.  - he is net negative 1.8 liters since admission, mild uptrend in Cr. He is on lasix 80mg  IV bid. Yesterday on this regiment was net negative 1.1 liters. Fairly modest diuresis on fairly high dose lasix. Will give a dose of oral metolazone to potentiate diuresis today.  Afib rates are controlled on dilt and metoprolol, he remains on coumadin with therapeutic INR. His pulmonary HTN is likely multifactorial given left sided heart disease and COPD.   Theodore Dolly MD

## 2013-06-19 DIAGNOSIS — D62 Acute posthemorrhagic anemia: Secondary | ICD-10-CM

## 2013-06-19 DIAGNOSIS — J9600 Acute respiratory failure, unspecified whether with hypoxia or hypercapnia: Secondary | ICD-10-CM

## 2013-06-19 DIAGNOSIS — I472 Ventricular tachycardia: Secondary | ICD-10-CM

## 2013-06-19 DIAGNOSIS — E875 Hyperkalemia: Secondary | ICD-10-CM

## 2013-06-19 DIAGNOSIS — Z79899 Other long term (current) drug therapy: Secondary | ICD-10-CM

## 2013-06-19 DIAGNOSIS — D649 Anemia, unspecified: Secondary | ICD-10-CM

## 2013-06-19 DIAGNOSIS — I4729 Other ventricular tachycardia: Secondary | ICD-10-CM

## 2013-06-19 LAB — BASIC METABOLIC PANEL
BUN: 20 mg/dL (ref 6–23)
CHLORIDE: 94 meq/L — AB (ref 96–112)
CO2: 36 meq/L — AB (ref 19–32)
CREATININE: 1.33 mg/dL (ref 0.50–1.35)
Calcium: 9.2 mg/dL (ref 8.4–10.5)
GFR calc Af Amer: 60 mL/min — ABNORMAL LOW (ref >90)
GFR calc non Af Amer: 51 mL/min — ABNORMAL LOW (ref >90)
Glucose, Bld: 128 mg/dL — ABNORMAL HIGH (ref 70–99)
Potassium: 5.6 mEq/L — ABNORMAL HIGH (ref 3.7–5.3)
SODIUM: 137 meq/L (ref 137–147)

## 2013-06-19 LAB — CBC
HEMATOCRIT: 31.2 % — AB (ref 39.0–52.0)
HEMOGLOBIN: 9.2 g/dL — AB (ref 13.0–17.0)
MCH: 27.4 pg (ref 26.0–34.0)
MCHC: 29.5 g/dL — ABNORMAL LOW (ref 30.0–36.0)
MCV: 92.9 fL (ref 78.0–100.0)
Platelets: 318 10*3/uL (ref 150–400)
RBC: 3.36 MIL/uL — AB (ref 4.22–5.81)
RDW: 16.4 % — ABNORMAL HIGH (ref 11.5–15.5)
WBC: 8.1 10*3/uL (ref 4.0–10.5)

## 2013-06-19 LAB — PROTIME-INR
INR: 2.04 — AB (ref 0.00–1.49)
Prothrombin Time: 22.4 seconds — ABNORMAL HIGH (ref 11.6–15.2)

## 2013-06-19 LAB — FOLATE RBC: RBC Folate: 863 ng/mL — ABNORMAL HIGH (ref >280)

## 2013-06-19 MED ORDER — WARFARIN SODIUM 6 MG PO TABS
6.0000 mg | ORAL_TABLET | Freq: Once | ORAL | Status: AC
  Administered 2013-06-19: 6 mg via ORAL
  Filled 2013-06-19: qty 1

## 2013-06-19 MED ORDER — MAGNESIUM SULFATE 40 MG/ML IJ SOLN
2.0000 g | Freq: Once | INTRAMUSCULAR | Status: AC
  Administered 2013-06-19: 2 g via INTRAVENOUS
  Filled 2013-06-19: qty 50

## 2013-06-19 MED ORDER — MAGNESIUM OXIDE 400 (241.3 MG) MG PO TABS
400.0000 mg | ORAL_TABLET | Freq: Once | ORAL | Status: AC
  Administered 2013-06-19: 400 mg via ORAL
  Filled 2013-06-19: qty 1

## 2013-06-19 MED ORDER — METOLAZONE 5 MG PO TABS
5.0000 mg | ORAL_TABLET | Freq: Once | ORAL | Status: AC
  Administered 2013-06-19: 5 mg via ORAL
  Filled 2013-06-19: qty 1

## 2013-06-19 NOTE — Progress Notes (Signed)
[facility] PROGRESS NOTE  Theodore [name] Bomba WUJ:[id number] DOB: 1940-06-25 DOA: 06/16/2013 PCP: Tula Nakayama, MD  Assessment/Plan  Acute hypoxic respiratory failure secondary to diastolic heart failure exacerbation with underlying severe pulmonary hypertension but likely reflects left-sided pressures.  Pro-BNP trending down.   -  Continue telemetry, atrial fibrillation, rate controlled -  Daily weights, wt stable -  Strict ins and outs:  -1.2L -  Troponins negative -  Diuretics per cardiology  -  Wean oxygen as tolerated -  Appreciate cardiology assistance  Chronic atrial fibrillation, rate controlled -  Continue metoprolol and diltiazem -  INR therapeutic on warfarin, despite pharmacy  Non-sustained V tach -  Keep electrolytes wnl  Chronic COPD, no change in cough, amount or quality of sputum -  Continue Symbicort and Spiriva -  Continue albuterol when necessary  Gout, stable, continue allopurinol  Cigarette nicotine abuse and dependence with withdrawal -  Counseled cessation -  Declined nicotine patch  Iron deficiency anemia with declining hemoglobin -  Transfuse for hemoglobin less than 7 -  B12 804, folate pending, TSH 2.58 -  Occult stool -  Globulin gap is present, check SPEP.  Upep/IFE canceled by lab.  Will need to be reordered if spep abnormal  Hyperkalemia due to poor diuresis with KCl supplementation -  D/c KCl -  Diuretics increased by cards  Diet:  2 g sodium Access:  PIV IVF:  Off Proph:  Warfarin  Code Status: Full Family Communication: Patient alone Disposition Plan: Pending further diuresis   Consultants:  Cardiology  Procedures:  ECHO  Antibiotics:  None   HPI/Subjective:  States he continues to have some chest tightness and SOB.  Legs are still swollen  Objective: Filed Vitals:   06/18/13 2049 06/19/13 0537 06/19/13 0752 06/19/13 1050  BP: 113/70 118/69  112/76  Pulse: 80 78  61  Temp: 98.1 F (36.7 C) 98 F (36.7  C)    TempSrc: Oral Oral    Resp: 20 20    Height:      Weight:  67.586 kg (149 lb)    SpO2: 99% 98% 99%     Intake/Output Summary (Last 24 hours) at 06/19/13 1228 Last data filed at 06/19/13 0647  Gross per 24 hour  Intake    600 ml  Output    900 ml  Net   -300 ml   Filed Weights   06/17/13 0640 06/18/13 0434 06/19/13 0537  Weight: 66.724 kg (147 lb 1.6 oz) 67.949 kg (149 lb 12.8 oz) 67.586 kg (149 lb)    Exam:   General:  African American male, No acute distress  HEENT:  NCAT, MMM  Cardiovascular:  RRR, nl S1, S2 no mrg, 2+ pulses, warm extremities  Respiratory:  Diminished bilateral breath sounds, faint rales at the bases, no rhonchi, no increased WOB  Abdomen:   NABS, soft, NT/ND  MSK:   Normal tone and bulk, 1+ slow pitting LEE, stable from yesterday  Neuro:  Grossly intact  Data Reviewed: Basic Metabolic Panel:  Recent Labs Lab 06/13/13 1113 06/16/13 1554 06/17/13 0537 06/18/13 0528 06/18/13 2018 06/19/13 0524  NA 142 145 143 142  --  137  K 3.9 3.4* 4.1 4.8  --  5.6*  CL 99 98 101 100  --  94*  CO2 30 32 34* 33*  --  36*  GLUCOSE 110* 107* 87 115*  --  128*  BUN 20 18 17 20   --  20  CREATININE 1.12 1.09 1.05 1.24  --  1.33  CALCIUM 9.2 9.1 8.7 9.1  --  9.2  MG  --   --   --   --  1.8  --    Liver Function Tests:  Recent Labs Lab 06/16/13 1600 06/17/13 0537  AST 28 20  ALT 15 11  ALKPHOS 107 91  BILITOT 0.5 0.4  PROT 7.9 6.8  ALBUMIN 3.3* 2.7*   No results found for this basename: LIPASE, AMYLASE,  in the last 168 hours No results found for this basename: AMMONIA,  in the last 168 hours CBC:  Recent Labs Lab 06/13/13 1113 06/16/13 1554 06/17/13 0537 06/18/13 0528 06/19/13 0524  WBC 7.4 9.7 8.0 6.6 8.1  NEUTROABS 6.0 7.0  --   --   --   HGB 9.4* 9.3* 8.6* 8.9* 9.2*  HCT 30.8* 30.4* 29.0* 30.4* 31.2*  MCV 91.4 91.0 91.8 93.8 92.9  PLT 323 359 306 317 318   Cardiac Enzymes:  Recent Labs Lab 06/13/13 1113 06/16/13 1554  06/16/13 1848 06/17/13 0036 06/17/13 0537  TROPONINI <0.30 <0.30 <0.30 <0.30 <0.30   BNP (last 3 results)  Recent Labs  06/13/13 1113 06/16/13 1554 06/17/13 0537  PROBNP 5155.0* 3560.0* 3222.0*   CBG: No results found for this basename: GLUCAP,  in the last 168 hours  No results found for this or any previous visit (from the past 240 hour(s)).   Studies: No results found.  Scheduled Meds: . albuterol  2.5 mg Nebulization BID  . allopurinol  300 mg Oral Daily  . brimonidine  1 drop Both Eyes BID   And  . timolol  1 drop Both Eyes BID  . budesonide-formoterol  2 puff Inhalation BID  . diltiazem  180 mg Oral Daily  . furosemide  80 mg Intravenous Q12H  . latanoprost  1 drop Both Eyes QHS  . metoprolol  50 mg Oral BID  . mirtazapine  7.5 mg Oral QHS  . montelukast  10 mg Oral QHS  . sodium chloride  3 mL Intravenous Q12H  . tiotropium  18 mcg Inhalation Daily  . warfarin  6 mg Oral Once  . Warfarin - Pharmacist Dosing Inpatient   Does not apply Q24H   Continuous Infusions:   Active Problems:   HYPERTENSION   Atrial fibrillation   COPD (chronic obstructive pulmonary disease)   CHF (congestive heart failure)   Acute on chronic diastolic heart failure   Pulmonary arterial hypertension   Acute respiratory failure with hypoxia   Normocytic anemia    Time spent: 30 min    [facility] Pager [phone]. If 7PM-7AM, please contact night-coverage at [email or web address], password [facility] 06/19/2013, 12:28 PM  LOS: 3 days

## 2013-06-19 NOTE — Progress Notes (Signed)
SUBJECTIVE: Pt says legs are swollen but have gradually been going down. Breathing better. Denies chest pain.     Intake/Output Summary (Last 24 hours) at 06/19/13 1007 Last data filed at 06/19/13 0647  Gross per 24 hour  Intake    600 ml  Output   1300 ml  Net   -700 ml    Current Facility-Administered Medications  Medication Dose Route Frequency Provider Last Rate Last Dose  . albuterol (PROVENTIL) (2.5 MG/3ML) 0.083% nebulizer solution 2.5 mg  2.5 mg Nebulization BID Nimish C Gosrani, MD   2.5 mg at 06/19/13 0750  . albuterol (PROVENTIL) (2.5 MG/3ML) 0.083% nebulizer solution 3 mL  3 mL Inhalation Q6H PRN Doree Albee, MD   3 mL at 06/16/13 2207  . allopurinol (ZYLOPRIM) tablet 300 mg  300 mg Oral Daily Doree Albee, MD   300 mg at 06/18/13 0910  . ALPRAZolam Duanne Moron) tablet 0.25 mg  0.25 mg Oral QHS PRN Doree Albee, MD   0.25 mg at 06/16/13 2224  . brimonidine (ALPHAGAN) 0.2 % ophthalmic solution 1 drop  1 drop Both Eyes BID Nimish C Gosrani, MD   1 drop at 06/18/13 2301   And  . timolol (TIMOPTIC) 0.5 % ophthalmic solution 1 drop  1 drop Both Eyes BID Doree Albee, MD   1 drop at 06/18/13 2301  . budesonide-formoterol (SYMBICORT) 160-4.5 MCG/ACT inhaler 2 puff  2 puff Inhalation BID Doree Albee, MD   2 puff at 06/19/13 0750  . diltiazem (CARDIZEM CD) 24 hr capsule 180 mg  180 mg Oral Daily Janece Canterbury, MD   180 mg at 06/18/13 0910  . furosemide (LASIX) injection 80 mg  80 mg Intravenous Q12H Nimish Luther Parody, MD   80 mg at 06/19/13 0640  . latanoprost (XALATAN) 0.005 % ophthalmic solution 1 drop  1 drop Both Eyes QHS Janece Canterbury, MD   1 drop at 06/18/13 2301  . magnesium oxide (MAG-OX) tablet 400 mg  400 mg Oral Once Herminio Commons, MD      . metolazone (ZAROXOLYN) tablet 5 mg  5 mg Oral Once Herminio Commons, MD      . metoprolol (LOPRESSOR) tablet 50 mg  50 mg Oral BID Janece Canterbury, MD   50 mg at 06/18/13 2300  . mirtazapine  (REMERON) tablet 7.5 mg  7.5 mg Oral QHS Nimish C Gosrani, MD   7.5 mg at 06/18/13 2300  . montelukast (SINGULAIR) tablet 10 mg  10 mg Oral QHS Nimish C Gosrani, MD   10 mg at 06/18/13 2300  . ondansetron (ZOFRAN) tablet 4 mg  4 mg Oral Q6H PRN Nimish Luther Parody, MD       Or  . ondansetron (ZOFRAN) injection 4 mg  4 mg Intravenous Q6H PRN Nimish C Gosrani, MD      . sodium chloride 0.9 % injection 3 mL  3 mL Intravenous Q12H Nimish C Gosrani, MD   3 mL at 06/18/13 2302  . temazepam (RESTORIL) capsule 15 mg  15 mg Oral QHS PRN Nimish C Anastasio Champion, MD      . tiotropium (SPIRIVA) inhalation capsule 18 mcg  18 mcg Inhalation Daily Doree Albee, MD   18 mcg at 06/19/13 0750  . traMADol (ULTRAM) tablet 50 mg  50 mg Oral Daily PRN Doree Albee, MD   50 mg at 06/16/13 2225  . Warfarin - Pharmacist Dosing Inpatient   Does not apply Q24H  Doree Albee, MD        Filed Vitals:   06/18/13 1935 06/18/13 2049 06/19/13 0537 06/19/13 0752  BP:  113/70 118/69   Pulse:  80 78   Temp:  98.1 F (36.7 C) 98 F (36.7 C)   TempSrc:  Oral Oral   Resp:  20 20   Height:      Weight:   149 lb (67.586 kg)   SpO2: 95% 99% 98% 99%    PHYSICAL EXAM General: NAD Neck: No JVD, no thyromegaly.  Lungs: Diminished breath sounds at bases b/l, no overt rales CV: Nondisplaced PMI.  Irregular rhythm, normal S1/S2, no S3, II/VI pansystolic murmur along left sternal border.  2+ pitting pretibial edema b/l.  No carotid bruit.  Normal pedal pulses.  Abdomen: Soft, nontender, no hepatosplenomegaly, no distention.  Neurologic: Alert and oriented x 3.  Psych: Normal affect. Extremities: No clubbing or cyanosis.   TELEMETRY: Reviewed telemetry pt in atrial fibrillation. Episodes of non-sustained VT noted, longest was 9 beats.  LABS: Basic Metabolic Panel:  Recent Labs  06/18/13 0528 06/18/13 2018 06/19/13 0524  NA 142  --  137  K 4.8  --  5.6*  CL 100  --  94*  CO2 33*  --  36*  GLUCOSE 115*  --  128*  BUN  20  --  20  CREATININE 1.24  --  1.33  CALCIUM 9.1  --  9.2  MG  --  1.8  --    Liver Function Tests:  Recent Labs  06/16/13 1600 06/17/13 0537  AST 28 20  ALT 15 11  ALKPHOS 107 91  BILITOT 0.5 0.4  PROT 7.9 6.8  ALBUMIN 3.3* 2.7*   No results found for this basename: LIPASE, AMYLASE,  in the last 72 hours CBC:  Recent Labs  06/16/13 1554  06/18/13 0528 06/19/13 0524  WBC 9.7  < > 6.6 8.1  NEUTROABS 7.0  --   --   --   HGB 9.3*  < > 8.9* 9.2*  HCT 30.4*  < > 30.4* 31.2*  MCV 91.0  < > 93.8 92.9  PLT 359  < > 317 318  < > = values in this interval not displayed. Cardiac Enzymes:  Recent Labs  06/16/13 1848 06/17/13 0036 06/17/13 0537  TROPONINI <0.30 <0.30 <0.30   BNP: No components found with this basename: POCBNP,  D-Dimer: No results found for this basename: DDIMER,  in the last 72 hours Hemoglobin A1C: No results found for this basename: HGBA1C,  in the last 72 hours Fasting Lipid Panel: No results found for this basename: CHOL, HDL, LDLCALC, TRIG, CHOLHDL, LDLDIRECT,  in the last 72 hours Thyroid Function Tests:  Recent Labs  06/18/13 0528  TSH 2.580   Anemia Panel:  Recent Labs  06/18/13 0528  VITAMINB12 804  FERRITIN 60  TIBC 354  IRON 33*    RADIOLOGY: Dg Chest 2 View  06/16/2013   CLINICAL DATA:  Tachycardia.  Shortness of breath.  Chest pain.  EXAM: CHEST  2 VIEW  COMPARISON:  DG CHEST 2 VIEW dated 06/13/2013  FINDINGS: Mediastinum normal. Cardiomegaly with mild pulmonary vascular prominence noted. No pleural effusion. No pulmonary infiltrate. No pneumothorax. No acute osseus abnormality. Degenerative changes thoracic spine.  IMPRESSION: Stable cardiomegaly and pulmonary venous congestion. No evidence of overt pulmonary edema. Exam stable from prior study.   Electronically Signed   By: Marcello Moores  Register   On: 06/16/2013 16:59   Dg Chest 2  View  06/13/2013   CLINICAL DATA:  Bilateral leg swelling and shortness of breath for 1 week.  EXAM:  CHEST  2 VIEW  COMPARISON:  03/17/2013  FINDINGS: The cardiac silhouette remains enlarged, unchanged. The lungs remain hyperinflated. There is mild cephalization of pulmonary blood flow, unchanged. No overt pulmonary edema is seen. No pleural effusion or pneumothorax is identified. There is no airspace consolidation. No acute osseous abnormality is identified.  IMPRESSION: Cardiomegaly and mild pulmonary vascular congestion without overt edema.   Electronically Signed   By: Logan Bores   On: 06/13/2013 11:34      ASSESSMENT AND PLAN: 1. Acute on chronic diastolic and right-sided heart failure: Continues to have modest diuretic response on IV Lasix 80 mg bid. Will give an additional dose of metolazone today to potentiate diuretic response. K 5.6 today. Has grade II diastolic dysfunction and severe pulmonary hypertension. 2. Atrial fibrillation: rate-controlled on diltiazem and metoprolol. INR therapeutic today. 3. NSVT: asymptomatic. Will given one dose of magnesium oxide 400 mg. Mg 1.8 (normal) on 4/8, with aim to keep > 2. K mildly elevated at 5.6 today. Continue to diurese.   Kate Sable, M.D., F.A.C.C.

## 2013-06-19 NOTE — Progress Notes (Signed)
ANTICOAGULATION CONSULT NOTE - follow up  Pharmacy Consult for Coumadin Indication: atrial fibrillation  Allergies  Allergen Reactions  . Aspirin     On coumadin   Patient Measurements: Height: 5\' 6"  (167.6 cm) Weight: 149 lb (67.586 kg) IBW/kg (Calculated) : 63.8   Vital Signs: Temp: 98 F (36.7 C) (04/09 0537) Temp src: Oral (04/09 0537) BP: 112/76 mmHg (04/09 1050) Pulse Rate: 61 (04/09 1050)  Labs:  Recent Labs  06/16/13 1848 06/17/13 0036 06/17/13 0537 06/18/13 0528 06/19/13 0524  HGB  --   --  8.6* 8.9* 9.2*  HCT  --   --  29.0* 30.4* 31.2*  PLT  --   --  306 317 318  LABPROT  --   --  24.9* 25.2* 22.4*  INR  --   --  2.34* 2.38* 2.04*  CREATININE  --   --  1.05 1.24 1.33  TROPONINI <0.30 <0.30 <0.30  --   --    Estimated Creatinine Clearance: 44.6 ml/min (by C-G formula based on Cr of 1.33).  Medical History: Past Medical History  Diagnosis Date  . Anemia     Hemoglobin 11.6 in 04/2008 -> resolved   . Tobacco abuse   . Glaucoma   . Allergic rhinitis   . Atrial fibrillation     Coumadin  . Diastolic heart failure     LVEF 83-33%, rate 2 diastolic dysfunction 10/3289  . Pulmonary hypertension     70 mmHg 03/2012  . Shingles   . Essential hypertension, benign   . COPD (chronic obstructive pulmonary disease)   . Asthma    Medications:  Scheduled:  . albuterol  2.5 mg Nebulization BID  . allopurinol  300 mg Oral Daily  . brimonidine  1 drop Both Eyes BID   And  . timolol  1 drop Both Eyes BID  . budesonide-formoterol  2 puff Inhalation BID  . diltiazem  180 mg Oral Daily  . furosemide  80 mg Intravenous Q12H  . latanoprost  1 drop Both Eyes QHS  . metoprolol  50 mg Oral BID  . mirtazapine  7.5 mg Oral QHS  . montelukast  10 mg Oral QHS  . sodium chloride  3 mL Intravenous Q12H  . tiotropium  18 mcg Inhalation Daily  . Warfarin - Pharmacist Dosing Inpatient   Does not apply Q24H   Assessment: Continuation of Coumadin from home for atrial  fibrillation Home Regimen- reportedly has 4mg  tabs at home :  Patient takes 1 1/2 tablets on Thursdays, Saturdays, Mondays and Wednesdays. Patient takes 1 tablet on Fridays, Sundays, Tuesdays INR now therapeutic x 3 but is trending down.  Goal of Therapy:  INR 2-3 Monitor platelets by anticoagulation protocol: Yes   Plan:   Coumadin 6mg  po today (home dose)  INR/PT daily  Talyn Eddie A Tron Flythe 06/19/2013,11:20 AM

## 2013-06-20 DIAGNOSIS — R079 Chest pain, unspecified: Secondary | ICD-10-CM

## 2013-06-20 LAB — BASIC METABOLIC PANEL
BUN: 22 mg/dL (ref 6–23)
CHLORIDE: 92 meq/L — AB (ref 96–112)
CO2: 40 mEq/L (ref 19–32)
Calcium: 8.7 mg/dL (ref 8.4–10.5)
Creatinine, Ser: 1.3 mg/dL (ref 0.50–1.35)
GFR calc Af Amer: 61 mL/min — ABNORMAL LOW (ref >90)
GFR calc non Af Amer: 53 mL/min — ABNORMAL LOW (ref >90)
Glucose, Bld: 101 mg/dL — ABNORMAL HIGH (ref 70–99)
Potassium: 4 mEq/L (ref 3.7–5.3)
SODIUM: 137 meq/L (ref 137–147)

## 2013-06-20 LAB — IMMUNOFIXATION, URINE

## 2013-06-20 LAB — PROTIME-INR
INR: 2.2 — AB (ref 0.00–1.49)
Prothrombin Time: 23.7 seconds — ABNORMAL HIGH (ref 11.6–15.2)

## 2013-06-20 LAB — MAGNESIUM: MAGNESIUM: 2.2 mg/dL (ref 1.5–2.5)

## 2013-06-20 MED ORDER — SODIUM POLYSTYRENE SULFONATE 15 GM/60ML PO SUSP
15.0000 g | Freq: Once | ORAL | Status: AC
  Administered 2013-06-20: 15 g via ORAL
  Filled 2013-06-20: qty 60

## 2013-06-20 MED ORDER — WARFARIN SODIUM 2 MG PO TABS
4.0000 mg | ORAL_TABLET | Freq: Once | ORAL | Status: AC
Start: 2013-06-20 — End: 2013-06-20
  Administered 2013-06-20: 4 mg via ORAL
  Filled 2013-06-20: qty 2

## 2013-06-20 NOTE — Progress Notes (Signed)
ANTICOAGULATION CONSULT NOTE - follow up  Pharmacy Consult for Coumadin Indication: atrial fibrillation  Allergies  Allergen Reactions  . Aspirin     On coumadin   Patient Measurements: Height: 5\' 6"  (167.6 cm) Weight: 147 lb 3.2 oz (66.769 kg) IBW/kg (Calculated) : 63.8   Vital Signs: Temp: 97.9 F (36.6 C) (04/10 0417) Temp src: Oral (04/10 0417) BP: 106/64 mmHg (04/10 0417) Pulse Rate: 57 (04/10 0417)  Labs:  Recent Labs  06/18/13 0528 06/19/13 0524 06/20/13 0536  HGB 8.9* 9.2*  --   HCT 30.4* 31.2*  --   PLT 317 318  --   LABPROT 25.2* 22.4* 23.7*  INR 2.38* 2.04* 2.20*  CREATININE 1.24 1.33 1.30   Estimated Creatinine Clearance: 45.7 ml/min (by C-G formula based on Cr of 1.3).  Medical History: Past Medical History  Diagnosis Date  . Anemia     Hemoglobin 11.6 in 04/2008 -> resolved   . Tobacco abuse   . Glaucoma   . Allergic rhinitis   . Atrial fibrillation     Coumadin  . Diastolic heart failure     LVEF 68-37%, rate 2 diastolic dysfunction 04/9019  . Pulmonary hypertension     70 mmHg 03/2012  . Shingles   . Essential hypertension, benign   . COPD (chronic obstructive pulmonary disease)   . Asthma    Medications:  Scheduled:  . albuterol  2.5 mg Nebulization BID  . allopurinol  300 mg Oral Daily  . brimonidine  1 drop Both Eyes BID   And  . timolol  1 drop Both Eyes BID  . budesonide-formoterol  2 puff Inhalation BID  . diltiazem  180 mg Oral Daily  . furosemide  80 mg Intravenous Q12H  . latanoprost  1 drop Both Eyes QHS  . metoprolol  50 mg Oral BID  . mirtazapine  7.5 mg Oral QHS  . montelukast  10 mg Oral QHS  . sodium chloride  3 mL Intravenous Q12H  . tiotropium  18 mcg Inhalation Daily  . Warfarin - Pharmacist Dosing Inpatient   Does not apply Q24H   Assessment: Continuation of Coumadin from home for atrial fibrillation Home Regimen- reportedly has 4mg  tabs at home :  Patient takes 1 1/2 tablets on Thursdays, Saturdays, Mondays  and Wednesdays. Patient takes 1 tablet on Fridays, Sundays, Tuesdays INR is therapeutic.    Goal of Therapy:  INR 2-3 Monitor platelets by anticoagulation protocol: Yes   Plan:   Coumadin 4mg  po today (home dose)  INR/PT daily  Theodore [name] 06/20/2013,10:31 AM

## 2013-06-20 NOTE — Progress Notes (Signed)
Notified MD of patient's 7 beat run of V. Tach at 04:00 and the 15 beat of wide QRS at 05:00.  Patient was asleep but easily aroused and is asymptomatic.  Will continue to monitor patient.

## 2013-06-20 NOTE — Progress Notes (Signed)
[facility] PROGRESS NOTE  Theodore [name] DJM:[id number] DOB: 04/09/1940 DOA: 06/16/2013 PCP: Tula Nakayama, MD  Assessment/Plan  Acute hypoxic respiratory failure secondary to diastolic heart failure exacerbation with underlying severe pulmonary hypertension but likely reflects left-sided pressures.  Pro-BNP trending down.  BUN and creatinine are stable. -  Continue telemetry, atrial fibrillation, rate controlled -  Daily weights, wt stable -  Strict ins and outs:  -1.2L -  Troponins negative -  Diuretics per cardiology  -  Wean oxygen as tolerated -  Appreciate cardiology assistance  Chronic atrial fibrillation, rate controlled -  Continue metoprolol and diltiazem -  INR therapeutic on warfarin, despite pharmacy  Non-sustained V tach -  Keep electrolytes wnl -  Outpatient stress test  Chronic COPD, no change in cough, amount or quality of sputum -  Continue Symbicort and Spiriva -  Continue albuterol when necessary  Gout, stable, continue allopurinol  Cigarette nicotine abuse and dependence with withdrawal -  Counseled cessation -  Declined nicotine patch  Iron deficiency anemia with declining hemoglobin -  Transfuse for hemoglobin less than 7 -  B12 804, folate 863, TSH 2.58 -  Occult stool -  Globulin gap is present, check SPEP.  Upep/IFE canceled by lab.  Will need to be reordered if spep abnormal  Hyperkalemia due to poor diuresis with KCl supplementation -  D/c KCl -  Diuretics increased by cards  Diet:  2 g sodium Access:  PIV IVF:  Off Proph:  Warfarin  Code Status: Full Family Communication: Patient alone Disposition Plan: Pending further diuresis   Consultants:  Cardiology  Procedures:  ECHO  Antibiotics:  None   HPI/Subjective:  Left upper quadrant pain.  Legs are still swollen  Objective: Filed Vitals:   06/19/13 1955 06/20/13 0417 06/20/13 0802 06/20/13 1332  BP: 123/72 106/64  108/54  Pulse: 76 57  101  Temp: 97.9 F  (36.6 C) 97.9 F (36.6 C)  98.1 F (36.7 C)  TempSrc: Oral Oral  Oral  Resp: 20 20  20   Height:      Weight:  66.769 kg (147 lb 3.2 oz)    SpO2: 92% 100% 98% 96%    Intake/Output Summary (Last 24 hours) at 06/20/13 1618 Last data filed at 06/20/13 1333  Gross per 24 hour  Intake    800 ml  Output   3025 ml  Net  -2225 ml   Filed Weights   06/18/13 0434 06/19/13 0537 06/20/13 0417  Weight: 67.949 kg (149 lb 12.8 oz) 67.586 kg (149 lb) 66.769 kg (147 lb 3.2 oz)    Exam:   General:  African American male, No acute distress  HEENT:  NCAT, MMM  Cardiovascular:  RRR, nl S1, S2 no mrg, 2+ pulses, warm extremities  Respiratory:  Diminished bilateral breath sounds, rales at the bases, no rhonchi, no increased WOB  Abdomen:   NABS, soft, NT/ND  MSK:   Normal tone and bulk, 1+ slow pitting LEE, stable from yesterday  Neuro:  Grossly intact  Data Reviewed: Basic Metabolic Panel:  Recent Labs Lab 06/16/13 1554 06/17/13 0537 06/18/13 0528 06/18/13 2018 06/19/13 0524 06/20/13 0530 06/20/13 0536  NA 145 143 142  --  137  --  137  K 3.4* 4.1 4.8  --  5.6*  --  4.0  CL 98 101 100  --  94*  --  92*  CO2 32 34* 33*  --  36*  --  40*  GLUCOSE 107* 87 115*  --  128*  --  101*  BUN 18 17 20   --  20  --  22  CREATININE 1.09 1.05 1.24  --  1.33  --  1.30  CALCIUM 9.1 8.7 9.1  --  9.2  --  8.7  MG  --   --   --  1.8  --  2.2  --    Liver Function Tests:  Recent Labs Lab 06/16/13 1600 06/17/13 0537  AST 28 20  ALT 15 11  ALKPHOS 107 91  BILITOT 0.5 0.4  PROT 7.9 6.8  ALBUMIN 3.3* 2.7*   No results found for this basename: LIPASE, AMYLASE,  in the last 168 hours No results found for this basename: AMMONIA,  in the last 168 hours CBC:  Recent Labs Lab 06/16/13 1554 06/17/13 0537 06/18/13 0528 06/19/13 0524  WBC 9.7 8.0 6.6 8.1  NEUTROABS 7.0  --   --   --   HGB 9.3* 8.6* 8.9* 9.2*  HCT 30.4* 29.0* 30.4* 31.2*  MCV 91.0 91.8 93.8 92.9  PLT 359 306 317 318    Cardiac Enzymes:  Recent Labs Lab 06/16/13 1554 06/16/13 1848 06/17/13 0036 06/17/13 0537  TROPONINI <0.30 <0.30 <0.30 <0.30   BNP (last 3 results)  Recent Labs  06/13/13 1113 06/16/13 1554 06/17/13 0537  PROBNP 5155.0* 3560.0* 3222.0*   CBG: No results found for this basename: GLUCAP,  in the last 168 hours  No results found for this or any previous visit (from the past 240 hour(s)).   Studies: No results found.  Scheduled Meds: . albuterol  2.5 mg Nebulization BID  . allopurinol  300 mg Oral Daily  . brimonidine  1 drop Both Eyes BID   And  . timolol  1 drop Both Eyes BID  . budesonide-formoterol  2 puff Inhalation BID  . diltiazem  180 mg Oral Daily  . furosemide  80 mg Intravenous Q12H  . latanoprost  1 drop Both Eyes QHS  . metoprolol  50 mg Oral BID  . mirtazapine  7.5 mg Oral QHS  . montelukast  10 mg Oral QHS  . sodium chloride  3 mL Intravenous Q12H  . tiotropium  18 mcg Inhalation Daily  . warfarin  4 mg Oral Once  . Warfarin - Pharmacist Dosing Inpatient   Does not apply Q24H   Continuous Infusions:   Active Problems:   HYPERTENSION   Atrial fibrillation   COPD (chronic obstructive pulmonary disease)   CHF (congestive heart failure)   Acute on chronic diastolic heart failure   Pulmonary arterial hypertension   Acute respiratory failure with hypoxia   Normocytic anemia    Time spent: 30 min    [facility] Pager [phone]. If 7PM-7AM, please contact night-coverage at [email or web address], password [facility] 06/20/2013, 4:18 PM  LOS: 4 days

## 2013-06-20 NOTE — Progress Notes (Signed)
SUBJECTIVE: Pt thinks legs are a little less swollen. Has intermittent "chest soreness" in lower left chest region. Denies shortness of breath. Has had episodic runs of NSVT, longest was 14-beat run.    Allergies  Allergen Reactions  . Aspirin     On coumadin    Current Facility-Administered Medications  Medication Dose Route Frequency Provider Last Rate Last Dose  . albuterol (PROVENTIL) (2.5 MG/3ML) 0.083% nebulizer solution 2.5 mg  2.5 mg Nebulization BID Nimish C Anastasio Champion, MD   2.5 mg at 06/20/13 0802  . albuterol (PROVENTIL) (2.5 MG/3ML) 0.083% nebulizer solution 3 mL  3 mL Inhalation Q6H PRN Doree Albee, MD   3 mL at 06/16/13 2207  . allopurinol (ZYLOPRIM) tablet 300 mg  300 mg Oral Daily Nimish C Gosrani, MD   300 mg at 06/19/13 1051  . ALPRAZolam Duanne Moron) tablet 0.25 mg  0.25 mg Oral QHS PRN Doree Albee, MD   0.25 mg at 06/16/13 2224  . brimonidine (ALPHAGAN) 0.2 % ophthalmic solution 1 drop  1 drop Both Eyes BID Nimish C Gosrani, MD   1 drop at 06/19/13 2234   And  . timolol (TIMOPTIC) 0.5 % ophthalmic solution 1 drop  1 drop Both Eyes BID Doree Albee, MD   1 drop at 06/19/13 2235  . budesonide-formoterol (SYMBICORT) 160-4.5 MCG/ACT inhaler 2 puff  2 puff Inhalation BID Doree Albee, MD   2 puff at 06/20/13 0801  . diltiazem (CARDIZEM CD) 24 hr capsule 180 mg  180 mg Oral Daily Janece Canterbury, MD   180 mg at 06/19/13 1052  . furosemide (LASIX) injection 80 mg  80 mg Intravenous Q12H Nimish C Anastasio Champion, MD   80 mg at 06/20/13 0640  . latanoprost (XALATAN) 0.005 % ophthalmic solution 1 drop  1 drop Both Eyes QHS Janece Canterbury, MD   1 drop at 06/19/13 2235  . metoprolol (LOPRESSOR) tablet 50 mg  50 mg Oral BID Janece Canterbury, MD   50 mg at 06/19/13 2234  . mirtazapine (REMERON) tablet 7.5 mg  7.5 mg Oral QHS Nimish C Gosrani, MD   7.5 mg at 06/19/13 2234  . montelukast (SINGULAIR) tablet 10 mg  10 mg Oral QHS Doree Albee, MD   10 mg at 06/19/13 2234    . ondansetron (ZOFRAN) tablet 4 mg  4 mg Oral Q6H PRN Nimish Luther Parody, MD       Or  . ondansetron (ZOFRAN) injection 4 mg  4 mg Intravenous Q6H PRN Nimish C Gosrani, MD      . sodium chloride 0.9 % injection 3 mL  3 mL Intravenous Q12H Nimish C Gosrani, MD   3 mL at 06/19/13 2235  . temazepam (RESTORIL) capsule 15 mg  15 mg Oral QHS PRN Nimish C Anastasio Champion, MD      . tiotropium (SPIRIVA) inhalation capsule 18 mcg  18 mcg Inhalation Daily Doree Albee, MD   18 mcg at 06/20/13 0801  . traMADol (ULTRAM) tablet 50 mg  50 mg Oral Daily PRN Doree Albee, MD   50 mg at 06/16/13 2225  . Warfarin - Pharmacist Dosing Inpatient   Does not apply Q24H Doree Albee, MD        Past Medical History  Diagnosis Date  . Anemia     Hemoglobin 11.6 in 04/2008 -> resolved   . Tobacco abuse   . Glaucoma   . Allergic rhinitis   . Atrial fibrillation  Coumadin  . Diastolic heart failure     LVEF 95-28%, rate 2 diastolic dysfunction 06/1322  . Pulmonary hypertension     70 mmHg 03/2012  . Shingles   . Essential hypertension, benign   . COPD (chronic obstructive pulmonary disease)   . Asthma     Past Surgical History  Procedure Laterality Date  . Bilateral cataract surgery    . Herniorrhapy    . Tendon repair      Right hand surgical procedure for a tendon repair  . Colonoscopy N/A 11/29/2012    Procedure: COLONOSCOPY;  Surgeon: Danie Binder, MD;  Location: [facility];  Service: Endoscopy;  Laterality: N/A;  1:00  . Esophagogastroduodenoscopy N/A 11/29/2012    Procedure: ESOPHAGOGASTRODUODENOSCOPY (EGD);  Surgeon: Danie Binder, MD;  Location: [facility];  Service: Endoscopy;  Laterality: N/A;  . Eye surgery    . Hernia repair      History   Social History  . Marital Status: Widowed    Spouse Name: N/A    Number of Children: 2  . Years of Education: N/A   Occupational History  . Retired   .     Social History Main Topics  . Smoking status: Current Some Day Smoker --  0.20 packs/day for 55 years    Types: Cigarettes    Last Attempt to Quit: 02/13/2012  . Smokeless tobacco: Never Used     Comment: occasionally smokes  . Alcohol Use: No  . Drug Use: No  . Sexual Activity: No   Other Topics Concern  . Not on file   Social History Narrative  . No narrative on file     Filed Vitals:   06/19/13 1946 06/19/13 1955 06/20/13 0417 06/20/13 0802  BP:  123/72 106/64   Pulse:  76 57   Temp:  97.9 F (36.6 C) 97.9 F (36.6 C)   TempSrc:  Oral Oral   Resp:  20 20   Height:      Weight:   147 lb 3.2 oz (66.769 kg)   SpO2: 97% 92% 100% 98%    PHYSICAL EXAM General: NAD  Neck: No JVD, no thyromegaly.  Lungs: Diminished breath sounds at bases b/l, no overt rales  CV: Nondisplaced PMI. Irregular rhythm, normal S1/S2, no S3, II/VI pansystolic murmur along left sternal border. 1-2+ pitting pretibial edema b/l. More wrinkled appearance today. No carotid bruit. Normal pedal pulses.  Abdomen: Soft, nontender, no hepatosplenomegaly, no distention.  Neurologic: Alert and oriented x 3.  Psych: Normal affect.  Extremities: No clubbing or cyanosis.   TELEMETRY: Atrial fibrillation, HR 65-70 bpm range currently. Episodes of non-sustained VT noted, longest was 14 beats.      ASSESSMENT AND PLAN: 1. Acute on chronic diastolic and right-sided heart failure: Continues to have modest diuretic response on IV Lasix 80 mg bid, with additional 1.2 liters of output in last 24 hours. Has a contraction alkalosis after additional metolazone given yesterday (CO2 40). K 4.0 today. Has grade II diastolic dysfunction and severe pulmonary hypertension. Will continue current diuretic strategy, IV Lasix 80 mg bid. 2. Atrial fibrillation: rate-controlled on diltiazem and metoprolol. INR therapeutic today at 2.2.  3. NSVT: Asymptomatic during episodes, but does complain of mild intermittent chest soreness today. One dose of magnesium oxide 400 mg given yesterday, today Mg 2.2  (previously on 4/8), with aim to keep > 2. K normal at 4.0 today. Unable to increase beta blockers due to episodic bradycardia and intermittently soft BP.  I think he would benefit from an outpatient ischemic workup (nuclear stress test). 4. Chest soreness: see #3. Outpatient nuclear stress test to ascertain whether ischemic in etiology, given NSVT.    Kate Sable, M.D., F.A.C.C.

## 2013-06-21 LAB — BASIC METABOLIC PANEL
BUN: 21 mg/dL (ref 6–23)
CO2: 45 mEq/L (ref 19–32)
Calcium: 8.6 mg/dL (ref 8.4–10.5)
Chloride: 84 mEq/L — ABNORMAL LOW (ref 96–112)
Creatinine, Ser: 1.09 mg/dL (ref 0.50–1.35)
GFR calc Af Amer: 76 mL/min — ABNORMAL LOW (ref >90)
GFR calc non Af Amer: 65 mL/min — ABNORMAL LOW (ref >90)
Glucose, Bld: 86 mg/dL (ref 70–99)
Potassium: 3.4 mEq/L — ABNORMAL LOW (ref 3.7–5.3)
Sodium: 136 mEq/L — ABNORMAL LOW (ref 137–147)

## 2013-06-21 LAB — PROTIME-INR
INR: 2.12 — ABNORMAL HIGH (ref 0.00–1.49)
Prothrombin Time: 23.1 seconds — ABNORMAL HIGH (ref 11.6–15.2)

## 2013-06-21 MED ORDER — POLYETHYLENE GLYCOL 3350 17 G PO PACK
17.0000 g | PACK | Freq: Every day | ORAL | Status: DC
  Administered 2013-06-21 – 2013-06-24 (×4): 17 g via ORAL
  Filled 2013-06-21 (×4): qty 1

## 2013-06-21 MED ORDER — DOCUSATE SODIUM 100 MG PO CAPS
100.0000 mg | ORAL_CAPSULE | Freq: Two times a day (BID) | ORAL | Status: DC
  Administered 2013-06-21 – 2013-06-24 (×7): 100 mg via ORAL
  Filled 2013-06-21 (×7): qty 1

## 2013-06-21 MED ORDER — SENNA 8.6 MG PO TABS
2.0000 | ORAL_TABLET | Freq: Every day | ORAL | Status: DC
  Administered 2013-06-21 – 2013-06-23 (×3): 17.2 mg via ORAL
  Filled 2013-06-21 (×3): qty 2

## 2013-06-21 MED ORDER — POTASSIUM CHLORIDE CRYS ER 20 MEQ PO TBCR
40.0000 meq | EXTENDED_RELEASE_TABLET | Freq: Every day | ORAL | Status: DC
  Administered 2013-06-21: 40 meq via ORAL
  Filled 2013-06-21: qty 2

## 2013-06-21 MED ORDER — FUROSEMIDE 10 MG/ML IJ SOLN
80.0000 mg | Freq: Every day | INTRAMUSCULAR | Status: DC
  Administered 2013-06-22: 80 mg via INTRAVENOUS
  Filled 2013-06-21 (×2): qty 8

## 2013-06-21 MED ORDER — POTASSIUM CHLORIDE CRYS ER 20 MEQ PO TBCR
40.0000 meq | EXTENDED_RELEASE_TABLET | Freq: Two times a day (BID) | ORAL | Status: DC
  Administered 2013-06-21 – 2013-06-23 (×5): 40 meq via ORAL
  Filled 2013-06-21 (×5): qty 2

## 2013-06-21 MED ORDER — WARFARIN SODIUM 6 MG PO TABS
6.0000 mg | ORAL_TABLET | Freq: Once | ORAL | Status: AC
  Administered 2013-06-21: 6 mg via ORAL
  Filled 2013-06-21: qty 1

## 2013-06-21 NOTE — Progress Notes (Signed)
ANTICOAGULATION CONSULT NOTE - follow up  Pharmacy Consult for Coumadin Indication: atrial fibrillation  Allergies  Allergen Reactions  . Aspirin     On coumadin   Patient Measurements: Height: 5\' 6"  (167.6 cm) Weight: 142 lb 4 oz (64.524 kg) IBW/kg (Calculated) : 63.8   Vital Signs: Temp: 97.7 F (36.5 C) (04/11 0542) Temp src: Oral (04/11 0542) BP: 105/61 mmHg (04/11 0829) Pulse Rate: 74 (04/11 0829)  Labs:  Recent Labs  06/19/13 0524 06/20/13 0536 06/21/13 0440  HGB 9.2*  --   --   HCT 31.2*  --   --   PLT 318  --   --   LABPROT 22.4* 23.7* 23.1*  INR 2.04* 2.20* 2.12*  CREATININE 1.33 1.30 1.09   Estimated Creatinine Clearance: 54.5 ml/min (by C-G formula based on Cr of 1.09).  Medical History: Past Medical History  Diagnosis Date  . Anemia     Hemoglobin 11.6 in 04/2008 -> resolved   . Tobacco abuse   . Glaucoma   . Allergic rhinitis   . Atrial fibrillation     Coumadin  . Diastolic heart failure     LVEF 53-97%, rate 2 diastolic dysfunction 08/7339  . Pulmonary hypertension     70 mmHg 03/2012  . Shingles   . Essential hypertension, benign   . COPD (chronic obstructive pulmonary disease)   . Asthma    Medications:  Scheduled:  . albuterol  2.5 mg Nebulization BID  . allopurinol  300 mg Oral Daily  . brimonidine  1 drop Both Eyes BID   And  . timolol  1 drop Both Eyes BID  . budesonide-formoterol  2 puff Inhalation BID  . diltiazem  180 mg Oral Daily  . furosemide  80 mg Intravenous Q12H  . latanoprost  1 drop Both Eyes QHS  . metoprolol  50 mg Oral BID  . mirtazapine  7.5 mg Oral QHS  . montelukast  10 mg Oral QHS  . potassium chloride  40 mEq Oral Daily  . sodium chloride  3 mL Intravenous Q12H  . tiotropium  18 mcg Inhalation Daily  . Warfarin - Pharmacist Dosing Inpatient   Does not apply Q24H   Assessment: Continuation of Coumadin from home for atrial fibrillation Home Regimen- reportedly has 4mg  tabs at home :  Patient takes 1 1/2  tablets on Thursdays, Saturdays, Mondays and Wednesdays. Patient takes 1 tablet on Fridays, Sundays, Tuesdays INR is therapeutic.    Goal of Therapy:  INR 2-3 Monitor platelets by anticoagulation protocol: Yes   Plan:   Coumadin 6 mg po today (home dose)  INR/PT daily  Eduarda Scrivens [employer] 06/21/2013,8:53 AM

## 2013-06-21 NOTE — Progress Notes (Signed)
CRITICAL VALUE ALERT  Critical value received:  CO2 45  Date of notification:  06/21/13  Time of notification:  0708  Critical value read back:yes  Nurse who received alert:  j Kayana Thoen rn  MD notified (1st page):  Dr short via text page  Time of first page:  [phone]

## 2013-06-21 NOTE — Progress Notes (Signed)
[facility] PROGRESS NOTE  Theodore [name] VFI:[id number] DOB: 21-May-1940 DOA: 06/16/2013 PCP: Tula Nakayama, MD  Assessment/Plan  Acute hypoxic respiratory failure secondary to diastolic heart failure exacerbation with underlying severe pulmonary hypertension but likely reflects left-sided pressures.  Pro-BNP trending down.  BUN and creatinine are stable, however, bicarb has been rising -  Continue telemetry, atrial fibrillation, rate controlled -  Daily weights, wt down to 64kg  -  Strict ins and outs:  -2.2L -  Troponins negative -  Discussed with cardiology.  Recommended reducing lasix today to once daily and giving additional KCl to boost chloride stores -  Repeat BMP in AM -  Wean oxygen as tolerated -  Appreciate cardiology assistance  Chronic atrial fibrillation, rate controlled -  Continue metoprolol and diltiazem -  INR therapeutic on warfarin, despite pharmacy  Non-sustained V tach -  Keep electrolytes wnl -  Outpatient stress test  Chronic COPD, no change in cough, amount or quality of sputum -  Continue Symbicort and Spiriva -  Continue albuterol when necessary  Gout, stable, continue allopurinol  Cigarette nicotine abuse and dependence with withdrawal -  Counseled cessation -  Declined nicotine patch  Iron deficiency anemia with declining hemoglobin -  Transfuse for hemoglobin less than 7 -  B12 804, folate 863, TSH 2.58 -  Occult stool -  SPEP with faint restricted bands, however, IFE neg (polyclonal increase)  Hypokalemia after holding potassium -  Restart home KCl 40mg  BID  Constipation -  Start colace, senna, miralax  Diet:  2 g sodium Access:  PIV IVF:  Off Proph:  Warfarin  Code Status: Full Family Communication: Patient alone Disposition Plan: Pending further diuresis   Consultants:  Cardiology  Procedures:  ECHO  Antibiotics:  None   HPI/Subjective:  Constipated.  Legs are still swollen but softer, breathing still  [name] with exertion  Objective: Filed Vitals:   06/21/13 0500 06/21/13 0542 06/21/13 0702 06/21/13 0829  BP:  118/61  105/61  Pulse:  99  74  Temp:  97.7 F (36.5 C)    TempSrc:  Oral    Resp:  20    Height:      Weight: 64.524 kg (142 lb 4 oz)     SpO2:  87% 90%     Intake/Output Summary (Last 24 hours) at 06/21/13 1045 Last data filed at 06/21/13 1042  Gross per 24 hour  Intake    480 ml  Output   4415 ml  Net  -3935 ml   Filed Weights   06/19/13 0537 06/20/13 0417 06/21/13 0500  Weight: 67.586 kg (149 lb) 66.769 kg (147 lb 3.2 oz) 64.524 kg (142 lb 4 oz)    Exam:   General:  African American male, No acute distress  HEENT:  NCAT, MMM  Cardiovascular:  RRR, nl S1, S2 no mrg, 2+ pulses, warm extremities  Respiratory:  Diminished bilateral breath sounds, rales at the bases, no rhonchi, no increased WOB  Abdomen:   NABS, soft, NT/ND  MSK:   Normal tone and bulk, 1+ slow pitting LEE, stable from yesterday.  Softer each day  Neuro:  Grossly intact  Data Reviewed: Basic Metabolic Panel:  Recent Labs Lab 06/17/13 0537 06/18/13 0528 06/18/13 2018 06/19/13 0524 06/20/13 0530 06/20/13 0536 06/21/13 0440  NA 143 142  --  137  --  137 136*  K 4.1 4.8  --  5.6*  --  4.0 3.4*  CL 101 100  --  94*  --  92* 84*  CO2 34* 33*  --  36*  --  40* 45*  GLUCOSE 87 115*  --  128*  --  101* 86  BUN 17 20  --  20  --  22 21  CREATININE 1.05 1.24  --  1.33  --  1.30 1.09  CALCIUM 8.7 9.1  --  9.2  --  8.7 8.6  MG  --   --  1.8  --  2.2  --   --    Liver Function Tests:  Recent Labs Lab 06/16/13 1600 06/17/13 0537  AST 28 20  ALT 15 11  ALKPHOS 107 91  BILITOT 0.5 0.4  PROT 7.9 6.8  ALBUMIN 3.3* 2.7*   No results found for this basename: LIPASE, AMYLASE,  in the last 168 hours No results found for this basename: AMMONIA,  in the last 168 hours CBC:  Recent Labs Lab 06/16/13 1554 06/17/13 0537 06/18/13 0528 06/19/13 0524  WBC 9.7 8.0 6.6 8.1  NEUTROABS  7.0  --   --   --   HGB 9.3* 8.6* 8.9* 9.2*  HCT 30.4* 29.0* 30.4* 31.2*  MCV 91.0 91.8 93.8 92.9  PLT 359 306 317 318   Cardiac Enzymes:  Recent Labs Lab 06/16/13 1554 06/16/13 1848 06/17/13 0036 06/17/13 0537  TROPONINI <0.30 <0.30 <0.30 <0.30   BNP (last 3 results)  Recent Labs  06/13/13 1113 06/16/13 1554 06/17/13 0537  PROBNP 5155.0* 3560.0* 3222.0*   CBG: No results found for this basename: GLUCAP,  in the last 168 hours  No results found for this or any previous visit (from the past 240 hour(s)).   Studies: No results found.  Scheduled Meds: . albuterol  2.5 mg Nebulization BID  . allopurinol  300 mg Oral Daily  . brimonidine  1 drop Both Eyes BID   And  . timolol  1 drop Both Eyes BID  . budesonide-formoterol  2 puff Inhalation BID  . diltiazem  180 mg Oral Daily  . [START ON 06/22/2013] furosemide  80 mg Intravenous Daily  . latanoprost  1 drop Both Eyes QHS  . metoprolol  50 mg Oral BID  . mirtazapine  7.5 mg Oral QHS  . montelukast  10 mg Oral QHS  . potassium chloride  40 mEq Oral BID  . sodium chloride  3 mL Intravenous Q12H  . tiotropium  18 mcg Inhalation Daily  . warfarin  6 mg Oral Once  . Warfarin - Pharmacist Dosing Inpatient   Does not apply Q24H   Continuous Infusions:   Active Problems:   HYPERTENSION   Atrial fibrillation   COPD (chronic obstructive pulmonary disease)   CHF (congestive heart failure)   Acute on chronic diastolic heart failure   Pulmonary arterial hypertension   Acute respiratory failure with hypoxia   Normocytic anemia    Time spent: 30 min    [facility] Pager [phone]. If 7PM-7AM, please contact night-coverage at [email or web address], password [facility] 06/21/2013, 10:45 AM  LOS: 5 days

## 2013-06-22 ENCOUNTER — Inpatient Hospital Stay (HOSPITAL_COMMUNITY): Payer: Medicare Other

## 2013-06-22 DIAGNOSIS — D72829 Elevated white blood cell count, unspecified: Secondary | ICD-10-CM

## 2013-06-22 LAB — CBC
HCT: 32 % — ABNORMAL LOW (ref 39.0–52.0)
Hemoglobin: 9.6 g/dL — ABNORMAL LOW (ref 13.0–17.0)
MCH: 27.3 pg (ref 26.0–34.0)
MCHC: 30 g/dL (ref 30.0–36.0)
MCV: 90.9 fL (ref 78.0–100.0)
PLATELETS: 302 10*3/uL (ref 150–400)
RBC: 3.52 MIL/uL — AB (ref 4.22–5.81)
RDW: 16.3 % — ABNORMAL HIGH (ref 11.5–15.5)
WBC: 11.5 10*3/uL — ABNORMAL HIGH (ref 4.0–10.5)

## 2013-06-22 LAB — URINALYSIS, ROUTINE W REFLEX MICROSCOPIC
Bilirubin Urine: NEGATIVE
Glucose, UA: NEGATIVE mg/dL
Hgb urine dipstick: NEGATIVE
Ketones, ur: NEGATIVE mg/dL
LEUKOCYTES UA: NEGATIVE
Nitrite: NEGATIVE
PH: 7.5 (ref 5.0–8.0)
PROTEIN: NEGATIVE mg/dL
Specific Gravity, Urine: 1.01 (ref 1.005–1.030)
Urobilinogen, UA: 0.2 mg/dL (ref 0.0–1.0)

## 2013-06-22 LAB — BASIC METABOLIC PANEL
BUN: 22 mg/dL (ref 6–23)
CALCIUM: 8.9 mg/dL (ref 8.4–10.5)
CO2: >45 mEq/L (ref 19–32)
CREATININE: 1.04 mg/dL (ref 0.50–1.35)
Chloride: 84 mEq/L — ABNORMAL LOW (ref 96–112)
GFR calc Af Amer: 80 mL/min — ABNORMAL LOW (ref >90)
GFR, EST NON AFRICAN AMERICAN: 69 mL/min — AB (ref >90)
GLUCOSE: 104 mg/dL — AB (ref 70–99)
Potassium: 3.4 mEq/L — ABNORMAL LOW (ref 3.7–5.3)
SODIUM: 134 meq/L — AB (ref 137–147)

## 2013-06-22 LAB — PROTIME-INR
INR: 1.75 — AB (ref 0.00–1.49)
Prothrombin Time: 19.9 seconds — ABNORMAL HIGH (ref 11.6–15.2)

## 2013-06-22 LAB — OCCULT BLOOD X 1 CARD TO LAB, STOOL: FECAL OCCULT BLD: NEGATIVE

## 2013-06-22 MED ORDER — WARFARIN SODIUM 6 MG PO TABS
6.0000 mg | ORAL_TABLET | Freq: Once | ORAL | Status: AC
  Administered 2013-06-22: 6 mg via ORAL
  Filled 2013-06-22: qty 1

## 2013-06-22 MED ORDER — POTASSIUM CHLORIDE 10 MEQ/100ML IV SOLN
10.0000 meq | INTRAVENOUS | Status: AC
  Administered 2013-06-22 (×2): 10 meq via INTRAVENOUS
  Filled 2013-06-22: qty 100

## 2013-06-22 NOTE — Evaluation (Signed)
Physical Therapy Evaluation Patient Details Name: Theodore [name] MRN: [id number] DOB: 24-Oct-1940 Today's Date: 06/22/2013   History of Present Illness  Acute hypoxic respiratory failure secondary to diastolic heart failure exacerbation with underlying severe pulmonary hypertension but likely reflects left-sided pressures.  Pro-BNP trending down.  BUN and creatinine are stable, however, bicarb has been rising.    Clinical Impression  Patient is independent with all bed mobility, transfers, gait and stair ambulation. Patient requires assistance only with O2 tank management for which he is expected to come off O2 upon discharge. Patient is safe to discharge home with no home health PT recommended. Patient states he does not want physical therapy to continue and is safe and appropriate for this request to be granted.     Follow Up Recommendations No PT follow up          Precautions / Restrictions Precautions Precautions: Fall Precaution Comments: weakness      Mobility  Bed Mobility Overal bed mobility: Independent                Transfers Overall transfer level: Independent                  Ambulation/Gait Ambulation/Gait assistance: Modified independent (Device/Increase time) Ambulation Distance (Feet): 300 Feet Assistive device: None     Gait velocity interpretation: Below normal speed for age/gender    Stairs Stairs: Yes Stairs assistance: Independent Stair Management: One rail Right Number of Stairs: 8    Wheelchair Mobility    Modified Rankin (Stroke Patients Only)       Balance Overall balance assessment: Modified Independent                                           Pertinent Vitals/Pain No pain    Home Living Family/patient expects to be discharged to:: Private residence Living Arrangements: Children (Son and two boys, wife recently passed away) Available Help at Discharge: Family Type of Home: House Home Access:  Stairs to enter Entrance Stairs-Rails:  (2 hand rails) Entrance Stairs-Number of Steps: 3 Home Layout: One level Home Equipment: None      Prior Function Level of Independence: Independent               Hand Dominance        Extremity/Trunk Assessment               Lower Extremity Assessment: Overall WFL for tasks assessed      Cervical / Trunk Assessment: Normal  Communication   Communication: No difficulties  Cognition Arousal/Alertness: Awake/alert Behavior During Therapy: WFL for tasks assessed/performed Overall Cognitive Status: Within Functional Limits for tasks assessed                      General Comments      Exercises        Assessment/Plan    PT Assessment Patent does not need any further PT services  PT Diagnosis     PT Problem List    PT Treatment Interventions     PT Goals (Current goals can be found in the Care Plan section)      Frequency     Barriers to discharge        Co-evaluation               End of Session Equipment Utilized During Treatment: Oxygen  Activity Tolerance: Patient tolerated treatment well Patient left: in bed Nurse Communication: Mobility status         Time: 1635-1700 PT Time Calculation (min): 25 min   Charges:   PT Evaluation $Initial PT Evaluation Tier I: 1 Procedure PT Treatments $Gait Training: 8-22 mins   PT G Codes:          Theodore [name] PT DPT 06/22/2013, 5:05 PM

## 2013-06-22 NOTE — Progress Notes (Signed)
[facility] PROGRESS NOTE  Theodore [name] TFT:[id number] DOB: 1940/06/02 DOA: 06/16/2013 PCP: Tula Nakayama, MD  Assessment/Plan  Acute hypoxic respiratory failure secondary to diastolic heart failure exacerbation with underlying severe pulmonary hypertension but likely reflects left-sided pressures.  Pro-BNP trending down.  BUN and creatinine are stable, however, bicarb has been rising.  Still with rales and lower extremity edema.   -  Continue telemetry, atrial fibrillation, rate controlled -  Daily weights, wt down to 64kg  -  Strict ins and outs:  -1.3L -  Troponins negative -  Continue once daily lasix today  -  Give additional KCl to boost chloride stores -  Repeat BMP in AM -  Wean oxygen as tolerated -  Appreciate cardiology assistance  Chronic atrial fibrillation, rate controlled -  Continue metoprolol and diltiazem -  INR therapeutic on warfarin, despite pharmacy  Non-sustained V tach -  Keep electrolytes wnl -  Outpatient stress test  Chronic COPD, no change in cough, amount or quality of sputum -  Continue Symbicort and Spiriva -  Continue albuterol when necessary  Gout, stable, continue allopurinol  Cigarette nicotine abuse and dependence with withdrawal -  Counseled cessation -  Declined nicotine patch  Iron deficiency anemia with declining hemoglobin -  Transfuse for hemoglobin less than 7 -  B12 804, folate 863, TSH 2.58 -  Occult stool -  SPEP with faint restricted bands, however, IFE neg (polyclonal increase)  Hypokalemia after holding potassium -  Continue home KCl 40mg  BID -  Additional IV KCl today  Constipation -  Continue colace, senna, miralax  Leukocytosis and temperature trending up,  -  Repeat UA:  Neg -  CXR:  Inconclusive, probable atelectasis -  Incentive spirometry and OOB  Diet:  2 g sodium Access:  PIV IVF:  Off Proph:  Warfarin  Code Status: Full Family Communication: Patient alone Disposition Plan: Pending  further diuresis   Consultants:  Cardiology  Procedures:  ECHO  Antibiotics:  None   HPI/Subjective:  Legs are softer, breathing still Theodore [name] with exertion  Objective: Filed Vitals:   06/21/13 2146 06/21/13 2311 06/22/13 0508 06/22/13 0824  BP: 135/74  113/61   Pulse: 84  75   Temp: 100 F (37.8 C)  99.8 F (37.7 C)   TempSrc: Oral  Oral   Resp: 20  20   Height:      Weight:   62.869 kg (138 lb 9.6 oz)   SpO2: 100% 97% 99% 100%    Intake/Output Summary (Last 24 hours) at 06/22/13 1039 Last data filed at 06/22/13 0806  Gross per 24 hour  Intake    480 ml  Output   1815 ml  Net  -1335 ml   Filed Weights   06/20/13 0417 06/21/13 0500 06/22/13 0508  Weight: 66.769 kg (147 lb 3.2 oz) 64.524 kg (142 lb 4 oz) 62.869 kg (138 lb 9.6 oz)    Exam:   General:  Theodore [name], No acute distress  HEENT:  NCAT, MMM  Cardiovascular:  RRR, nl S1, S2 no mrg, 2+ pulses, warm extremities  Respiratory:  Diminished bilateral breath sounds, rales at the bases, no rhonchi, no increased WOB  Abdomen:   NABS, soft, NT/ND  MSK:   Normal tone and bulk, legs are much softer today with trace edema  Neuro:  Grossly intact  Data Reviewed: Basic Metabolic Panel:  Recent Labs Lab 06/18/13 0528 06/18/13 2018 06/19/13 0524 06/20/13 0530 06/20/13 0536 06/21/13 0440 06/22/13 0444  NA  142  --  137  --  137 136* 134*  K 4.8  --  5.6*  --  4.0 3.4* 3.4*  CL 100  --  94*  --  92* 84* 84*  CO2 33*  --  36*  --  40* 45* >45*  GLUCOSE 115*  --  128*  --  101* 86 104*  BUN 20  --  20  --  22 21 22   CREATININE 1.24  --  1.33  --  1.30 1.09 1.04  CALCIUM 9.1  --  9.2  --  8.7 8.6 8.9  MG  --  1.8  --  2.2  --   --   --    Liver Function Tests:  Recent Labs Lab 06/16/13 1600 06/17/13 0537  AST 28 20  ALT 15 11  ALKPHOS 107 91  BILITOT 0.5 0.4  PROT 7.9 6.8  ALBUMIN 3.3* 2.7*   No results found for this basename: LIPASE, AMYLASE,  in the last 168 hours No  results found for this basename: AMMONIA,  in the last 168 hours CBC:  Recent Labs Lab 06/16/13 1554 06/17/13 0537 06/18/13 0528 06/19/13 0524 06/22/13 0444  WBC 9.7 8.0 6.6 8.1 11.5*  NEUTROABS 7.0  --   --   --   --   HGB 9.3* 8.6* 8.9* 9.2* 9.6*  HCT 30.4* 29.0* 30.4* 31.2* 32.0*  MCV 91.0 91.8 93.8 92.9 90.9  PLT 359 306 317 318 302   Cardiac Enzymes:  Recent Labs Lab 06/16/13 1554 06/16/13 1848 06/17/13 0036 06/17/13 0537  TROPONINI <0.30 <0.30 <0.30 <0.30   BNP (last 3 results)  Recent Labs  06/13/13 1113 06/16/13 1554 06/17/13 0537  PROBNP 5155.0* 3560.0* 3222.0*   CBG: No results found for this basename: GLUCAP,  in the last 168 hours  No results found for this or any previous visit (from the past 240 hour(s)).   Studies: Dg Chest Port 1 View  06/22/2013   CLINICAL DATA:  Follow-up chest radiograph  EXAM: PORTABLE CHEST - 1 VIEW  COMPARISON:  DG CHEST 2 VIEW dated 06/16/2013; DG CHEST 2 VIEW dated 03/17/2013; DG CHEST 1 VIEW dated 05/18/2012  FINDINGS: Grossly unchanged enlarged cardiac silhouette and mediastinal contours given slightly reduced lung volume. Mild pulmonary venous congestion without frank evidence of edema. Slight worsening of bilateral medial basilar heterogeneous opacities. No definite pleural effusion or pneumothorax. Grossly unchanged bones.  IMPRESSION: 1. No definite acute cardiopulmonary disease on this hypoventilated AP portable examination. Further evaluation with a PA and lateral chest radiograph may be obtained as clinically indicated. 2. Mild pulmonary venous congestion and worsening bibasilar atelectasis.   Electronically Signed   By: Sandi Mariscal M.D.   On: 06/22/2013 09:19    Scheduled Meds: . albuterol  2.5 mg Nebulization BID  . allopurinol  300 mg Oral Daily  . brimonidine  1 drop Both Eyes BID   And  . timolol  1 drop Both Eyes BID  . budesonide-formoterol  2 puff Inhalation BID  . diltiazem  180 mg Oral Daily  . docusate sodium   100 mg Oral BID  . furosemide  80 mg Intravenous Daily  . latanoprost  1 drop Both Eyes QHS  . metoprolol  50 mg Oral BID  . mirtazapine  7.5 mg Oral QHS  . montelukast  10 mg Oral QHS  . polyethylene glycol  17 g Oral Daily  . potassium chloride  40 mEq Oral BID  . senna  2 tablet  Oral QHS  . sodium chloride  3 mL Intravenous Q12H  . tiotropium  18 mcg Inhalation Daily  . Warfarin - Pharmacist Dosing Inpatient   Does not apply Q24H   Continuous Infusions:   Active Problems:   HYPERTENSION   Atrial fibrillation   COPD (chronic obstructive pulmonary disease)   CHF (congestive heart failure)   Acute on chronic diastolic heart failure   Pulmonary arterial hypertension   Acute respiratory failure with hypoxia   Normocytic anemia    Time spent: 30 min    [facility] Pager [phone]. If 7PM-7AM, please contact night-coverage at [email or web address], password [facility] 06/22/2013, 10:39 AM  LOS: 6 days

## 2013-06-22 NOTE — Progress Notes (Signed)
Pt has critical high CO2 greater than 45 per lab. MD notified. Oncoming nurse aware.

## 2013-06-22 NOTE — Progress Notes (Signed)
ANTICOAGULATION CONSULT NOTE - follow up  Pharmacy Consult for Coumadin Indication: atrial fibrillation  Allergies  Allergen Reactions  . Aspirin     On coumadin   Patient Measurements: Height: 5\' 6"  (167.6 cm) Weight: 138 lb 9.6 oz (62.869 kg) IBW/kg (Calculated) : 63.8   Vital Signs: Temp: 99.8 F (37.7 C) (04/12 0508) Temp src: Oral (04/12 0508) BP: 113/61 mmHg (04/12 0508) Pulse Rate: 75 (04/12 0508)  Labs:  Recent Labs  06/20/13 0536 06/21/13 0440 06/22/13 0444  HGB  --   --  9.6*  HCT  --   --  32.0*  PLT  --   --  302  LABPROT 23.7* 23.1* 19.9*  INR 2.20* 2.12* 1.75*  CREATININE 1.30 1.09 1.04   Estimated Creatinine Clearance: 56.3 ml/min (by C-G formula based on Cr of 1.04).  Medical History: Past Medical History  Diagnosis Date  . Anemia     Hemoglobin 11.6 in 04/2008 -> resolved   . Tobacco abuse   . Glaucoma   . Allergic rhinitis   . Atrial fibrillation     Coumadin  . Diastolic heart failure     LVEF 16-10%, rate 2 diastolic dysfunction 11/6043  . Pulmonary hypertension     70 mmHg 03/2012  . Shingles   . Essential hypertension, benign   . COPD (chronic obstructive pulmonary disease)   . Asthma    Medications:  Scheduled:  . albuterol  2.5 mg Nebulization BID  . allopurinol  300 mg Oral Daily  . brimonidine  1 drop Both Eyes BID   And  . timolol  1 drop Both Eyes BID  . budesonide-formoterol  2 puff Inhalation BID  . diltiazem  180 mg Oral Daily  . docusate sodium  100 mg Oral BID  . furosemide  80 mg Intravenous Daily  . latanoprost  1 drop Both Eyes QHS  . metoprolol  50 mg Oral BID  . mirtazapine  7.5 mg Oral QHS  . montelukast  10 mg Oral QHS  . polyethylene glycol  17 g Oral Daily  . potassium chloride  40 mEq Oral BID  . senna  2 tablet Oral QHS  . sodium chloride  3 mL Intravenous Q12H  . tiotropium  18 mcg Inhalation Daily  . Warfarin - Pharmacist Dosing Inpatient   Does not apply Q24H   Assessment: Continuation of  Coumadin from home for atrial fibrillation Home Regimen- reportedly has 4mg  tabs at home :  Patient takes 1 1/2 tablets on Thursdays, Saturdays, Mondays and Wednesdays. Patient takes 1 tablet on Fridays, Sundays, Tuesdays INR sub therapeutic this AM   Goal of Therapy:  INR 2-3 Monitor platelets by anticoagulation protocol: Yes   Plan:   Coumadin 6 mg po today (home dose)  INR/PT daily  Marvel Mcphillips [employer] 06/22/2013,11:17 AM

## 2013-06-23 LAB — PROTIME-INR
INR: 2 — AB (ref 0.00–1.49)
PROTHROMBIN TIME: 22.1 s — AB (ref 11.6–15.2)

## 2013-06-23 LAB — BASIC METABOLIC PANEL
BUN: 22 mg/dL (ref 6–23)
CO2: 45 meq/L — AB (ref 19–32)
CREATININE: 1.04 mg/dL (ref 0.50–1.35)
Calcium: 9 mg/dL (ref 8.4–10.5)
Chloride: 86 mEq/L — ABNORMAL LOW (ref 96–112)
GFR calc non Af Amer: 69 mL/min — ABNORMAL LOW (ref >90)
GFR, EST AFRICAN AMERICAN: 80 mL/min — AB (ref >90)
Glucose, Bld: 152 mg/dL — ABNORMAL HIGH (ref 70–99)
Potassium: 4.2 mEq/L (ref 3.7–5.3)
SODIUM: 137 meq/L (ref 137–147)

## 2013-06-23 MED ORDER — TORSEMIDE 20 MG PO TABS
40.0000 mg | ORAL_TABLET | Freq: Every day | ORAL | Status: DC
  Administered 2013-06-23 – 2013-06-24 (×2): 40 mg via ORAL
  Filled 2013-06-23 (×2): qty 2

## 2013-06-23 MED ORDER — DILTIAZEM HCL ER COATED BEADS 180 MG PO CP24: 180.0000 mg | ORAL_CAPSULE | Freq: Every day | ORAL | Status: DC

## 2013-06-23 MED ORDER — PREDNISONE 20 MG PO TABS
40.0000 mg | ORAL_TABLET | Freq: Every day | ORAL | Status: DC
  Administered 2013-06-23 – 2013-06-24 (×2): 40 mg via ORAL
  Filled 2013-06-23 (×2): qty 2

## 2013-06-23 MED ORDER — WARFARIN SODIUM 6 MG PO TABS
6.0000 mg | ORAL_TABLET | Freq: Once | ORAL | Status: AC
  Administered 2013-06-23: 6 mg via ORAL
  Filled 2013-06-23: qty 1

## 2013-06-23 MED ORDER — BRIMONIDINE TARTRATE-TIMOLOL 0.2-0.5 % OP SOLN: 1.0000 [drp] | Freq: Two times a day (BID) | OPHTHALMIC | Status: DC

## 2013-06-23 NOTE — Progress Notes (Signed)
ANTICOAGULATION CONSULT NOTE - follow up  Pharmacy Consult for Coumadin Indication: atrial fibrillation  Allergies  Allergen Reactions  . Aspirin     On coumadin   Patient Measurements: Height: 5\' 6"  (167.6 cm) Weight: 139 lb 8.8 oz (63.3 kg) IBW/kg (Calculated) : 63.8   Vital Signs: Temp: 98.2 F (36.8 C) (04/13 0601) Temp src: Oral (04/13 0601) BP: 117/64 mmHg (04/13 0907) Pulse Rate: 64 (04/13 0907)  Labs:  Recent Labs  06/21/13 0440 06/22/13 0444 06/23/13 0452  HGB  --  9.6*  --   HCT  --  32.0*  --   PLT  --  302  --   LABPROT 23.1* 19.9* 22.1*  INR 2.12* 1.75* 2.00*  CREATININE 1.09 1.04 1.04   Estimated Creatinine Clearance: 56.6 ml/min (by C-G formula based on Cr of 1.04).  Medical History: Past Medical History  Diagnosis Date  . Anemia     Hemoglobin 11.6 in 04/2008 -> resolved   . Tobacco abuse   . Glaucoma   . Allergic rhinitis   . Atrial fibrillation     Coumadin  . Diastolic heart failure     LVEF 97-41%, rate 2 diastolic dysfunction 08/3843  . Pulmonary hypertension     70 mmHg 03/2012  . Shingles   . Essential hypertension, benign   . COPD (chronic obstructive pulmonary disease)   . Asthma    Medications:  Scheduled:  . albuterol  2.5 mg Nebulization BID  . allopurinol  300 mg Oral Daily  . brimonidine  1 drop Both Eyes BID   And  . timolol  1 drop Both Eyes BID  . budesonide-formoterol  2 puff Inhalation BID  . diltiazem  180 mg Oral Daily  . docusate sodium  100 mg Oral BID  . latanoprost  1 drop Both Eyes QHS  . metoprolol  50 mg Oral BID  . mirtazapine  7.5 mg Oral QHS  . montelukast  10 mg Oral QHS  . polyethylene glycol  17 g Oral Daily  . potassium chloride  40 mEq Oral BID  . senna  2 tablet Oral QHS  . sodium chloride  3 mL Intravenous Q12H  . tiotropium  18 mcg Inhalation Daily  . torsemide  40 mg Oral Daily  . Warfarin - Pharmacist Dosing Inpatient   Does not apply Q24H   Assessment: Continuation of Coumadin from  home for atrial fibrillation Home Regimen- reportedly has 4mg  tabs at home :  Patient takes 1 1/2 tablets on Thursdays, Saturdays, Mondays and Wednesdays. Patient takes 1 tablet on Fridays, Sundays, Tuesdays INR sub therapeutic this AM   Goal of Therapy:  INR 2-3 Monitor platelets by anticoagulation protocol: Yes   Plan:   Coumadin 6 mg po today (home dose)  INR/PT daily  Geremy Rister A Hezikiah Retzloff 06/23/2013,10:41 AM

## 2013-06-23 NOTE — Discharge Summary (Addendum)
Physician Discharge Summary  Theodore [name] BPZ:[id number] DOB: 22-Dec-1940 DOA: 06/16/2013  PCP: Tula Nakayama, MD  Admit date: 06/16/2013 Discharge date: 06/24/2013  Recommendations for Outpatient Follow-up:  1. Home health oxygen and RN 2. Followup with cardiology in one to 2 weeks.  Consideration of outpatient stress test given episodes of chest pain with nonsustained ventricular tachycardia.  Repeat BMP to check potassium and creatinine.   3. PCP:  1-2 weeks.  Repeat CBC to f/u anemia and leukocytosis  Discharge Diagnoses:  Active Problems:   HYPERTENSION   Atrial fibrillation   COPD (chronic obstructive pulmonary disease)   CHF (congestive heart failure)   Acute on chronic diastolic heart failure   Pulmonary arterial hypertension   Acute respiratory failure with hypoxia   Normocytic anemia   Leukocytosis, unspecified   COPD with acute exacerbation   Discharge Condition: Stable, improved  Diet recommendation: Low sodium  Wt Readings from Last 3 Encounters:  06/24/13 64.638 kg (142 lb 8 oz)  06/13/13 63.504 kg (140 lb)  05/21/13 65.826 kg (145 lb 1.9 oz)    History of present illness:  Theodore [name] is a 73 y.o. male  This 73 year old man has a history of diastolic congestive heart failure previously and now presents with a one-week history of bilateral leg swelling and increasing dyspnea. He denies any specific cough, fever. He does have a history of COPD. He denies any chest pain. When he was evaluated in the emergency room, it was felt that he was in congestive heart failure and he is now being admitted for further management. Echocardiogram was previously done just over one year ago which showed ejection fraction of 50-55% but also severely dilated left and right atria.  Hospital Course:   Acute hypoxic respiratory failure secondary to diastolic heart failure exacerbation with severe underlying pulmonary hypertension. He was admitted to telemetry. EKG demonstrated  atrial fibrillation. Troponins were negative. He was started on Lasix 80 mg IV twice a day with intermittent metolazone and diuresis approximately 1-2 L per day. His weight on admission was recorded as 67 kg. His weight on discharge is 64.6 kg. He is net -7 L overall since admission. He was seen by cardiology who recommended discharging him on torsemide 40 mg once a day. On exam, the patient had markedly improved rales and lower extremity edema by the time of discharge. He will followup with cardiology in one to 2 weeks for a repeat weight check. He was advised to continue to eat a low-sodium diet and weigh himself daily at home. He should call his cardiologist if he gains more than 3 pounds in one day or 5 pounds in one week.  Despite diuresis, he continued to have low oxygen saturations. He was set up for home oxygen.   Chronic atrial fibrillation, rate controlled. Continue metoprolol and diltiazem. His INR at the time of discharge is therapeutic. He should continue warfarin. INR check in one week. This can be drawn by his home health nurse and sent to the cardiology office.  Nonsustained ventricular tachycardia. Advised to keep his electrolytes with potassium greater than 4 and magnesium greater than 2. Because he had some episodes of atypical chest pain in the setting of nonsustained ventricular tachycardia, cardiology is recommending outpatient stress test.  Acute COPD with increased cough and sputum. He continued Symbicort and Spiriva. He continued albuterol when necessary.  He was given prescriptions for azithromycin and prednisone for home.  CXR demonstrated atelectasis.    Gout, stable, continued allopurinol.  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 Counseled smoking cessation. He declined a nicotine patch.  Iron deficiency anemia with steadily declining hemoglobin. He did not require blood transfusion. His vitamin B12 level was 804, folate 863, TSH 2.58. His occult stool was  negative. SPEP demonstrated faint restricted band however urine IFE was negative.  Hypokalemia due to diuretics. Continued home potassium chloride 40 meq twice a day.  Constipation, resolved with Colace, Senokot and MiraLax.  Leukocytosis with mildly elevated temperature. Urinalysis was negative. Chest x-ray demonstrated probable atelectasis. He was given an incentive spirometer encouraged to be out of bed. His temperature trended down.    Consultants:  Cardiology Procedures:  ECHO Antibiotics:  None    Discharge Exam: Filed Vitals:   06/24/13 0853  BP: 124/84  Pulse:   Temp:   Resp:    Filed Vitals:   06/24/13 0512 06/24/13 0655 06/24/13 0722 06/24/13 0853  BP:  125/81  124/84  Pulse:  74    Temp:  96.9 F (36.1 C)    TempSrc:  Oral    Resp:  18    Height:      Weight: 64.638 kg (142 lb 8 oz)     SpO2:   96%     General:  Cachectic African American male, No acute distress  HEENT: NCAT, MMM  Cardiovascular: RRR, nl S1, S2 no mrg, 2+ pulses, warm extremities  Respiratory: Diminished bilateral breath sounds, minimal rales at the bases, no rhonchi, no increased WOB  Abdomen: NABS, soft, NT/ND  MSK: Normal tone and bulk, legs are much soft with trace edema, slightly more in right leg than left leg Neuro: Grossly intact   Discharge Instructions      Discharge Orders   Future Appointments Provider Department Dept Phone   07/03/2013 2:00 PM Fayrene Helper, MD [facility] [phone]   07/14/2013 4:00 PM Herminio Commons, MD [facility] [place] [phone]   Future Orders Complete By Expires   (HEART FAILURE PATIENTS) Call MD:  Anytime you have any of the following symptoms: 1) 3 pound weight gain in 24 hours or 5 pounds in 1 week 2) shortness of breath, with or without a dry hacking cough 3) swelling in the hands, feet or stomach 4) if you have to sleep on extra pillows at night in order to breathe.  As directed    Call MD for:  difficulty  breathing, headache or visual disturbances  As directed    Call MD for:  extreme fatigue  As directed    Call MD for:  hives  As directed    Call MD for:  persistant dizziness or light-headedness  As directed    Call MD for:  persistant nausea and vomiting  As directed    Call MD for:  severe uncontrolled pain  As directed    Call MD for:  temperature >100.4  As directed    Diet - low sodium heart healthy  As directed    Discharge instructions  As directed    Increase activity slowly  As directed        Medication List    STOP taking these medications       furosemide 40 MG tablet  Commonly known as:  LASIX      TAKE these medications       albuterol (2.5 MG/3ML) 0.083% nebulizer solution  Commonly known as:  PROVENTIL  Take 2.5 mg by nebulization 2 (two) times daily.     albuterol 108 (  90 BASE) MCG/ACT inhaler  Commonly known as:  PROAIR HFA  Inhale 2 puffs into the lungs every 6 (six) hours as needed for wheezing or shortness of breath.     allopurinol 300 MG tablet  Commonly known as:  ZYLOPRIM  Take 1 tablet (300 mg total) by mouth daily.     ALPRAZolam 0.25 MG tablet  Commonly known as:  XANAX  Take 0.25 mg by mouth 2 (two) times daily as needed for anxiety or sleep.     azithromycin 250 MG tablet  Commonly known as:  ZITHROMAX Z-PAK  Take 2 tabs on day one, then take 1 tab once daily until all the pills are gone     brimonidine-timolol 0.2-0.5 % ophthalmic solution  Commonly known as:  COMBIGAN  Place 1 drop into both eyes every 12 (twelve) hours.     budesonide-formoterol 160-4.5 MCG/ACT inhaler  Commonly known as:  SYMBICORT  Inhale 2 puffs into the lungs 2 (two) times daily.     diltiazem 180 MG 24 hr capsule  Commonly known as:  CARDIZEM CD  Take 1 capsule (180 mg total) by mouth daily.     metoprolol 50 MG tablet  Commonly known as:  LOPRESSOR  Take 1 tablet (50 mg total) by mouth 2 (two) times daily.     mirtazapine 7.5 MG tablet  Commonly known  as:  REMERON  Take 1 tablet (7.5 mg total) by mouth at bedtime.     montelukast 10 MG tablet  Commonly known as:  SINGULAIR  Take 10 mg by mouth at bedtime.     potassium chloride 10 MEQ tablet  Commonly known as:  K-DUR  Take 4 tablets (40 mEq total) by mouth 2 (two) times daily.     predniSONE 20 MG tablet  Commonly known as:  DELTASONE  Take 2 tablets (40 mg total) by mouth daily with breakfast.     tiotropium 18 MCG inhalation capsule  Commonly known as:  SPIRIVA  Place 18 mcg into inhaler and inhale daily.     torsemide 20 MG tablet  Commonly known as:  DEMADEX  Take 2 tablets (40 mg total) by mouth daily.     traMADol 50 MG tablet  Commonly known as:  ULTRAM  Take 1 tablet (50 mg total) by mouth daily as needed.     travoprost (benzalkonium) 0.004 % ophthalmic solution  Commonly known as:  TRAVATAN  Place 1 drop into both eyes at bedtime.     warfarin 4 MG tablet  Commonly known as:  COUMADIN  Take 4-6 mg by mouth See admin instructions. Patient takes 1 1/2 tablets on Thursdays, Saturdays, Mondays and Wednesdays. Patient takes 1 tablet on Fridays, Sundays, Tuesdays       Follow-up Information   Follow up with Tula Nakayama, MD. Schedule an appointment as soon as possible for a visit in 1 month.   Specialty:  Family Medicine   Contact information:   [address], [place] [address] [phone]       Follow up with Herminio Commons, MD On 07/14/2013. (4 pm)    Specialty:  Cardiology   Contact information:   [address] [place] [address] [phone]        The results of significant diagnostics from this hospitalization (including imaging, microbiology, ancillary and laboratory) are listed below for reference.    Significant Diagnostic Studies: Dg Chest 2 View  06/16/2013   CLINICAL DATA:  Tachycardia.  Shortness  of breath.  Chest pain.  EXAM: CHEST  2 VIEW  COMPARISON:  DG CHEST 2 VIEW dated 06/13/2013  FINDINGS: Mediastinum  normal. Cardiomegaly with mild pulmonary vascular prominence noted. No pleural effusion. No pulmonary infiltrate. No pneumothorax. No acute osseus abnormality. Degenerative changes thoracic spine.  IMPRESSION: Stable cardiomegaly and pulmonary venous congestion. No evidence of overt pulmonary edema. Exam stable from prior study.   Electronically Signed   By: Marcello Moores  Register   On: 06/16/2013 16:59   Dg Chest 2 View  06/13/2013   CLINICAL DATA:  Bilateral leg swelling and shortness of breath for 1 week.  EXAM: CHEST  2 VIEW  COMPARISON:  03/17/2013  FINDINGS: The cardiac silhouette remains enlarged, unchanged. The lungs remain hyperinflated. There is mild cephalization of pulmonary blood flow, unchanged. No overt pulmonary edema is seen. No pleural effusion or pneumothorax is identified. There is no airspace consolidation. No acute osseous abnormality is identified.  IMPRESSION: Cardiomegaly and mild pulmonary vascular congestion without overt edema.   Electronically Signed   By: Logan Bores   On: 06/13/2013 11:34   Dg Chest Port 1 View  06/22/2013   CLINICAL DATA:  Follow-up chest radiograph  EXAM: PORTABLE CHEST - 1 VIEW  COMPARISON:  DG CHEST 2 VIEW dated 06/16/2013; DG CHEST 2 VIEW dated 03/17/2013; DG CHEST 1 VIEW dated 05/18/2012  FINDINGS: Grossly unchanged enlarged cardiac silhouette and mediastinal contours given slightly reduced lung volume. Mild pulmonary venous congestion without frank evidence of edema. Slight worsening of bilateral medial basilar heterogeneous opacities. No definite pleural effusion or pneumothorax. Grossly unchanged bones.  IMPRESSION: 1. No definite acute cardiopulmonary disease on this hypoventilated AP portable examination. Further evaluation with a PA and lateral chest radiograph may be obtained as clinically indicated. 2. Mild pulmonary venous congestion and worsening bibasilar atelectasis.   Electronically Signed   By: Sandi Mariscal M.D.   On: 06/22/2013 09:19     Microbiology: No results found for this or any previous visit (from the past 240 hour(s)).   Labs: Basic Metabolic Panel:  Recent Labs Lab 06/18/13 2018  06/20/13 0530 06/20/13 0536 06/21/13 0440 06/22/13 0444 06/23/13 0452 06/24/13 0455  NA  --   < >  --  137 136* 134* 137 136*  K  --   < >  --  4.0 3.4* 3.4* 4.2 5.2  CL  --   < >  --  92* 84* 84* 86* 87*  CO2  --   < >  --  40* 45* >45* 45* 44*  GLUCOSE  --   < >  --  101* 86 104* 152* 148*  BUN  --   < >  --  22 21 22 22  27*  CREATININE  --   < >  --  1.30 1.09 1.04 1.04 1.09  CALCIUM  --   < >  --  8.7 8.6 8.9 9.0 9.5  MG 1.8  --  2.2  --   --   --   --   --   < > = values in this interval not displayed. Liver Function Tests: No results found for this basename: AST, ALT, ALKPHOS, BILITOT, PROT, ALBUMIN,  in the last 168 hours No results found for this basename: LIPASE, AMYLASE,  in the last 168 hours No results found for this basename: AMMONIA,  in the last 168 hours CBC:  Recent Labs Lab 06/18/13 0528 06/19/13 0524 06/22/13 0444  WBC 6.6 8.1 11.5*  HGB 8.9* 9.2* 9.6*  HCT 30.4* 31.2* 32.0*  MCV 93.8 92.9 90.9  PLT 317 318 302   Cardiac Enzymes: No results found for this basename: CKTOTAL, CKMB, CKMBINDEX, TROPONINI,  in the last 168 hours BNP: BNP (last 3 results)  Recent Labs  06/13/13 1113 06/16/13 1554 06/17/13 0537  PROBNP 5155.0* 3560.0* 3222.0*   CBG: No results found for this basename: GLUCAP,  in the last 168 hours  Time coordinating discharge: 45 minutes  Signed:  Janece Canterbury  [facility] 06/24/2013, 10:59 AM

## 2013-06-23 NOTE — Progress Notes (Signed)
[facility] PROGRESS NOTE  Theodore [name] BHA:[id number] DOB: 1940-06-26 DOA: 06/16/2013 PCP: Tula Nakayama, MD  Assessment/Plan  Acute hypoxic respiratory failure secondary to diastolic heart failure exacerbation with underlying severe pulmonary hypertension but likely reflects left-sided pressures.  BUN and creatinine are stable, however, bicarb has been rising.   -  Continue telemetry, atrial fibrillation, rate controlled -  Daily weights, wt down to 64kg  -  Strict ins and outs:  -268mL -  Troponins negative -  Changed to torsemide today -  Continue KCl to boost chloride stores -  Repeat BMP in AM -  Wean oxygen as tolerated -  Appreciate cardiology assistance  Chronic atrial fibrillation, rate controlled -  Continue metoprolol and diltiazem -  INR therapeutic on warfarin, despite pharmacy  Non-sustained V tach -  Keep electrolytes wnl -  Outpatient stress test  Chronic COPD, no change in cough, amount or quality of sputum -  Continue Symbicort and Spiriva -  Continue albuterol when necessary  Gout, stable, continue allopurinol  Cigarette nicotine abuse and dependence with withdrawal -  Counseled cessation -  Declined nicotine patch  Iron deficiency anemia with declining hemoglobin -  Transfuse for hemoglobin less than 7 -  B12 804, folate 863, TSH 2.58 -  Occult stool -  SPEP with faint restricted bands, however, IFE neg (polyclonal increase)  Hypokalemia due to diuretics -  Continue home KCl 40mg  BID  Constipation -  Continue colace, senna, miralax  Leukocytosis and temperature trending up,  -  Repeat UA:  Neg -  CXR:  Inconclusive, probable atelectasis -  Incentive spirometry and OOB  Diet:  2 g sodium Access:  PIV IVF:  Off Proph:  Warfarin  Code Status: Full Family Communication: Patient alone Disposition Plan:  Likely home tomorrow   Consultants:  Cardiology  Procedures:  ECHO  Antibiotics:  None   HPI/Subjective:  Legs  are softer, breathing still Theodore [name] with exertion.  Had fall yesterday with right knee abrasian  Objective: Filed Vitals:   06/23/13 0500 06/23/13 0601 06/23/13 0710 06/23/13 0907  BP:  107/67  117/64  Pulse:    64  Temp:  98.2 F (36.8 C)    TempSrc:  Oral    Resp:  20    Height:      Weight: 63.3 kg (139 lb 8.8 oz)     SpO2:  100% 99%     Intake/Output Summary (Last 24 hours) at 06/23/13 0956 Last data filed at 06/23/13 0933  Gross per 24 hour  Intake    720 ml  Output    775 ml  Net    -55 ml   Filed Weights   06/21/13 0500 06/22/13 0508 06/23/13 0500  Weight: 64.524 kg (142 lb 4 oz) 62.869 kg (138 lb 9.6 oz) 63.3 kg (139 lb 8.8 oz)    Exam:   General:  African American male, No acute distress  HEENT:  NCAT, MMM  Cardiovascular:  RRR, nl S1, S2 no mrg, 2+ pulses, warm extremities  Respiratory:  Diminished bilateral breath sounds, markedly improved rales at the bases, no rhonchi, no increased WOB  Abdomen:   NABS, soft, NT/ND  MSK:   Normal tone and bulk, legs are much softer today with trace edema  Neuro:  Grossly intact  Data Reviewed: Basic Metabolic Panel:  Recent Labs Lab 06/18/13 2018 06/19/13 0524 06/20/13 0530 06/20/13 0536 06/21/13 0440 06/22/13 0444 06/23/13 0452  NA  --  137  --  137 136* 134*  137  K  --  5.6*  --  4.0 3.4* 3.4* 4.2  CL  --  94*  --  92* 84* 84* 86*  CO2  --  36*  --  40* 45* >45* 45*  GLUCOSE  --  128*  --  101* 86 104* 152*  BUN  --  20  --  22 21 22 22   CREATININE  --  1.33  --  1.30 1.09 1.04 1.04  CALCIUM  --  9.2  --  8.7 8.6 8.9 9.0  MG 1.8  --  2.2  --   --   --   --    Liver Function Tests:  Recent Labs Lab 06/16/13 1600 06/17/13 0537  AST 28 20  ALT 15 11  ALKPHOS 107 91  BILITOT 0.5 0.4  PROT 7.9 6.8  ALBUMIN 3.3* 2.7*   No results found for this basename: LIPASE, AMYLASE,  in the last 168 hours No results found for this basename: AMMONIA,  in the last 168 hours CBC:  Recent Labs Lab  06/16/13 1554 06/17/13 0537 06/18/13 0528 06/19/13 0524 06/22/13 0444  WBC 9.7 8.0 6.6 8.1 11.5*  NEUTROABS 7.0  --   --   --   --   HGB 9.3* 8.6* 8.9* 9.2* 9.6*  HCT 30.4* 29.0* 30.4* 31.2* 32.0*  MCV 91.0 91.8 93.8 92.9 90.9  PLT 359 306 317 318 302   Cardiac Enzymes:  Recent Labs Lab 06/16/13 1554 06/16/13 1848 06/17/13 0036 06/17/13 0537  TROPONINI <0.30 <0.30 <0.30 <0.30   BNP (last 3 results)  Recent Labs  06/13/13 1113 06/16/13 1554 06/17/13 0537  PROBNP 5155.0* 3560.0* 3222.0*   CBG: No results found for this basename: GLUCAP,  in the last 168 hours  No results found for this or any previous visit (from the past 240 hour(s)).   Studies: Dg Chest Port 1 View  06/22/2013   CLINICAL DATA:  Follow-up chest radiograph  EXAM: PORTABLE CHEST - 1 VIEW  COMPARISON:  DG CHEST 2 VIEW dated 06/16/2013; DG CHEST 2 VIEW dated 03/17/2013; DG CHEST 1 VIEW dated 05/18/2012  FINDINGS: Grossly unchanged enlarged cardiac silhouette and mediastinal contours given slightly reduced lung volume. Mild pulmonary venous congestion without frank evidence of edema. Slight worsening of bilateral medial basilar heterogeneous opacities. No definite pleural effusion or pneumothorax. Grossly unchanged bones.  IMPRESSION: 1. No definite acute cardiopulmonary disease on this hypoventilated AP portable examination. Further evaluation with a PA and lateral chest radiograph may be obtained as clinically indicated. 2. Mild pulmonary venous congestion and worsening bibasilar atelectasis.   Electronically Signed   By: Sandi Mariscal M.D.   On: 06/22/2013 09:19    Scheduled Meds: . albuterol  2.5 mg Nebulization BID  . allopurinol  300 mg Oral Daily  . brimonidine  1 drop Both Eyes BID   And  . timolol  1 drop Both Eyes BID  . budesonide-formoterol  2 puff Inhalation BID  . diltiazem  180 mg Oral Daily  . docusate sodium  100 mg Oral BID  . latanoprost  1 drop Both Eyes QHS  . metoprolol  50 mg Oral BID  .  mirtazapine  7.5 mg Oral QHS  . montelukast  10 mg Oral QHS  . polyethylene glycol  17 g Oral Daily  . potassium chloride  40 mEq Oral BID  . senna  2 tablet Oral QHS  . sodium chloride  3 mL Intravenous Q12H  . tiotropium  18 mcg Inhalation  Daily  . torsemide  40 mg Oral Daily  . Warfarin - Pharmacist Dosing Inpatient   Does not apply Q24H   Continuous Infusions:   Active Problems:   HYPERTENSION   Atrial fibrillation   COPD (chronic obstructive pulmonary disease)   CHF (congestive heart failure)   Acute on chronic diastolic heart failure   Pulmonary arterial hypertension   Acute respiratory failure with hypoxia   Normocytic anemia   Leukocytosis, unspecified    Time spent: 30 min    [facility] Pager [phone]. If 7PM-7AM, please contact night-coverage at [email or web address], password [facility] 06/23/2013, 9:56 AM  LOS: 7 days

## 2013-06-23 NOTE — Progress Notes (Signed)
Pt found on floor. He states that he was getting up to go to bathroom and got tangled up when he moved his table. No injuries noted at this time. MD and family aware. Bed in lowest position and bed alarm on. Pathway to bathroom cleared and personal belongings within reach. Pt is reeducated on safety and asked to please call if he needs to get up.

## 2013-06-23 NOTE — Progress Notes (Signed)
Pt O2 sat on room air at rest is 78%  O2 at 2 liters nasal canula reapplied

## 2013-06-23 NOTE — Consult Note (Signed)
Consulting cardiologist: Rozann Lesches MD Primary Cardiologist: Kate Sable MD  Subjective:   Breathing much better, still coughing. Wants to go home.   Objective:   Temp:  [98.2 F (36.8 C)-99.4 F (37.4 C)] 98.2 F (36.8 C) (04/13 0601) Pulse Rate:  [68-84] 84 (04/12 2213) Resp:  [20] 20 (04/13 0601) BP: (105-114)/(66-67) 107/67 mmHg (04/13 0601) SpO2:  [96 %-100 %] 99 % (04/13 0710) Weight:  [139 lb 8.8 oz (63.3 kg)] 139 lb 8.8 oz (63.3 kg) (04/13 0500) Last BM Date:  (gets sennekot and colace. refuses enema at this time.)  Filed Weights   06/21/13 0500 06/22/13 0508 06/23/13 0500  Weight: 142 lb 4 oz (64.524 kg) 138 lb 9.6 oz (62.869 kg) 139 lb 8.8 oz (63.3 kg)    Intake/Output Summary (Last 24 hours) at 06/23/13 0843 Last data filed at 06/22/13 2213  Gross per 24 hour  Intake    720 ml  Output    675 ml  Net     45 ml    Telemetry: Atrial fibrillation 70's.   Exam:  General: No acute distress.  Lungs: Bibasilar  crackles, with intermittent productive coughing.   Cardiac: No elevated JVP or bruits. Irregularly irregular, no gallop or rub.   Abdomen: Normoactive bowel sounds, nontender, nondistended.  Extremities: No pitting edema. Skin is wrinkled in the LE bilaterally.    Lab Results:  Basic Metabolic Panel:  Recent Labs Lab 06/18/13 2018  06/20/13 0530  06/21/13 0440 06/22/13 0444 06/23/13 0452  NA  --   < >  --   < > 136* 134* 137  K  --   < >  --   < > 3.4* 3.4* 4.2  CL  --   < >  --   < > 84* 84* 86*  CO2  --   < >  --   < > 45* >45* 45*  GLUCOSE  --   < >  --   < > 86 104* 152*  BUN  --   < >  --   < > 21 22 22   CREATININE  --   < >  --   < > 1.09 1.04 1.04  CALCIUM  --   < >  --   < > 8.6 8.9 9.0  MG 1.8  --  2.2  --   --   --   --   < > = values in this interval not displayed.  Liver Function Tests:  Recent Labs Lab 06/16/13 1600 06/17/13 0537  AST 28 20  ALT 15 11  ALKPHOS 107 91  BILITOT 0.5 0.4  PROT 7.9 6.8   ALBUMIN 3.3* 2.7*    CBC:  Recent Labs Lab 06/18/13 0528 06/19/13 0524 06/22/13 0444  WBC 6.6 8.1 11.5*  HGB 8.9* 9.2* 9.6*  HCT 30.4* 31.2* 32.0*  MCV 93.8 92.9 90.9  PLT 317 318 302    Cardiac Enzymes:  Recent Labs Lab 06/16/13 1848 06/17/13 0036 06/17/13 0537  TROPONINI <0.30 <0.30 <0.30    Coagulation:  Recent Labs Lab 06/21/13 0440 06/22/13 0444 06/23/13 0452  INR 2.12* 1.75* 2.00*    Radiology: Dg Chest Port 1 View  06/22/2013   CLINICAL DATA:  Follow-up chest radiograph  EXAM: PORTABLE CHEST - 1 VIEW  COMPARISON:  DG CHEST 2 VIEW dated 06/16/2013; DG CHEST 2 VIEW dated 03/17/2013; DG CHEST 1 VIEW dated 05/18/2012  FINDINGS: Grossly unchanged enlarged cardiac silhouette and mediastinal contours given slightly reduced  lung volume. Mild pulmonary venous congestion without frank evidence of edema. Slight worsening of bilateral medial basilar heterogeneous opacities. No definite pleural effusion or pneumothorax. Grossly unchanged bones.  IMPRESSION: 1. No definite acute cardiopulmonary disease on this hypoventilated AP portable examination. Further evaluation with a PA and lateral chest radiograph may be obtained as clinically indicated. 2. Mild pulmonary venous congestion and worsening bibasilar atelectasis.   Electronically Signed   By: Sandi Mariscal M.D.   On: 06/22/2013 09:19     Medications:   Scheduled Medications: . albuterol  2.5 mg Nebulization BID  . allopurinol  300 mg Oral Daily  . brimonidine  1 drop Both Eyes BID   And  . timolol  1 drop Both Eyes BID  . budesonide-formoterol  2 puff Inhalation BID  . diltiazem  180 mg Oral Daily  . docusate sodium  100 mg Oral BID  . furosemide  80 mg Intravenous Daily  . latanoprost  1 drop Both Eyes QHS  . metoprolol  50 mg Oral BID  . mirtazapine  7.5 mg Oral QHS  . montelukast  10 mg Oral QHS  . polyethylene glycol  17 g Oral Daily  . potassium chloride  40 mEq Oral BID  . senna  2 tablet Oral QHS  . sodium  chloride  3 mL Intravenous Q12H  . tiotropium  18 mcg Inhalation Daily  . Warfarin - Pharmacist Dosing Inpatient   Does not apply Q24H   PRN Medications: albuterol, ALPRAZolam, ondansetron (ZOFRAN) IV, ondansetron, temazepam, traMADol   Assessment and Plan:   1. Acute on Chronic Diastolic CHF: He is breathing much better and has diuresed well. Wt is down 10 bls from highest wt recorded since admission. He admits to eating Cheeto's and other salty foods at home, but takes hie medications every day. Still on IV lasix 80 mg daily.  Uncertain dry wt. Creatinine 1.04 this am, stable over the last two days.  Still has some crackles in the bases, likely related to COPD. Appears euvolemic. Will try transitioning to PO today and watch his wt and fluid status. He is on 40 mg of lasix daily at home. Will place on torsemide, 40 mg daily for now. BMET in am.  2. Atrial fibrillation: Heart rate is well controlled. He is continued on warfarin INR is therapeutic. Continue on metoprolol 50 mg BID. Diltiazem 180 mg daily.   3. NSVT: Quiescent for now. Continue beta blocker. Can consider outpatient Cardiolite later for ischemic assessment as per Dr [name] notes.  4. COPD: Associated severe pulmonary hypertension. Continues on inhalers and singulair.    Phill Myron. Purcell Nails NP [facility] 06/23/2013, 8:43 AM   Attending note:  Patient seen and examined. Continues to feel better clinically. Heart rate is reasonably well controlled with some fluctuations. He is not reporting any chest pain at this time. No recurring NSVT. Plan will be to transition from IV Lasix to oral torsemide beginning at 40 mg daily. Followup intake and output as well as BMET in the morning. Hopefully nearing discharge soon. He will need to keep followup with Dr. Bronson Ing, consideration can be given to an outpatient Cardiolite to reassess ischemic burden as noted on prior notes.  Satira Sark, M.D., F.A.C.C.

## 2013-06-23 NOTE — Progress Notes (Signed)
Pt reassessed. Skin tear to right knee noted. Pink foam applied. Pt insists that he is fine. Pt is asked if he feels any effects from his fall. Pt still insists he is fine. Will inform oncoming nurse to continue to monitor.

## 2013-06-23 NOTE — Progress Notes (Signed)
Nutrition Brief Note  RD pulled to chart due to LOS  Wt Readings from Last 15 Encounters:  06/23/13 139 lb 8.8 oz (63.3 kg)  06/13/13 140 lb (63.504 kg)  05/21/13 145 lb 1.9 oz (65.826 kg)  04/11/13 143 lb (64.864 kg)  04/07/13 139 lb 0.6 oz (63.068 kg)  04/04/13 137 lb 14.4 oz (62.551 kg)  03/25/13 132 lb 0.6 oz (59.893 kg)  03/17/13 134 lb (60.782 kg)  02/12/13 139 lb (63.05 kg)  01/08/13 146 lb 1.3 oz (66.261 kg)  11/29/12 145 lb (65.772 kg)  11/29/12 145 lb (65.772 kg)  10/28/12 145 lb 6.4 oz (65.953 kg)  10/07/12 143 lb 1.9 oz (64.919 kg)  07/10/12 148 lb (67.132 kg)    Body mass index is 22.53 kg/(m^2). Patient meets criteria for normal weight based on current BMI.   Current diet order is 2 gm NA, patient is consuming approximately 100% of meals at this time. Labs and medications reviewed.   No nutrition interventions warranted at this time. If nutrition issues arise, please consult RD.   Hriday Stai A. Jimmye Norman, RD, LDN Pager: [phone]

## 2013-06-23 NOTE — Progress Notes (Signed)
Critical lab : CO2 45. Per lab. MD made aware.

## 2013-06-24 ENCOUNTER — Other Ambulatory Visit (HOSPITAL_COMMUNITY): Payer: Self-pay | Admitting: Internal Medicine

## 2013-06-24 ENCOUNTER — Other Ambulatory Visit: Payer: Self-pay | Admitting: Family Medicine

## 2013-06-24 DIAGNOSIS — J441 Chronic obstructive pulmonary disease with (acute) exacerbation: Secondary | ICD-10-CM

## 2013-06-24 LAB — BASIC METABOLIC PANEL
BUN: 27 mg/dL — AB (ref 6–23)
CO2: 44 mEq/L (ref 19–32)
Calcium: 9.5 mg/dL (ref 8.4–10.5)
Chloride: 87 mEq/L — ABNORMAL LOW (ref 96–112)
Creatinine, Ser: 1.09 mg/dL (ref 0.50–1.35)
GFR calc Af Amer: 76 mL/min — ABNORMAL LOW (ref >90)
GFR, EST NON AFRICAN AMERICAN: 65 mL/min — AB (ref >90)
Glucose, Bld: 148 mg/dL — ABNORMAL HIGH (ref 70–99)
POTASSIUM: 5.2 meq/L (ref 3.7–5.3)
SODIUM: 136 meq/L — AB (ref 137–147)

## 2013-06-24 LAB — PROTIME-INR
INR: 2.31 — AB (ref 0.00–1.49)
PROTHROMBIN TIME: 24.6 s — AB (ref 11.6–15.2)

## 2013-06-24 MED ORDER — PREDNISONE 20 MG PO TABS: 40.0000 mg | ORAL_TABLET | Freq: Every day | ORAL | Status: DC

## 2013-06-24 MED ORDER — POTASSIUM CHLORIDE CRYS ER 20 MEQ PO TBCR
40.0000 meq | EXTENDED_RELEASE_TABLET | Freq: Every day | ORAL | Status: DC
  Administered 2013-06-24: 40 meq via ORAL
  Filled 2013-06-24: qty 2

## 2013-06-24 MED ORDER — TORSEMIDE 20 MG PO TABS: 40.0000 mg | ORAL_TABLET | Freq: Every day | ORAL | Status: DC

## 2013-06-24 MED ORDER — AZITHROMYCIN 250 MG PO TABS: ORAL_TABLET | ORAL | Status: DC

## 2013-06-24 NOTE — Plan of Care (Signed)
Tech noticed pt turning bed alarm off.  Chair alarm added to bed.  Pt now has both alarms active to help prevent ambulation by self and further falls.  Pt informed as to why he needs alarms.

## 2013-06-24 NOTE — Discharge Planning (Signed)
Pt stated he was ready to go home and pain was under control.  IV and tele were removed.  Pt and family were given DC paper and education on future CHF complications.  Encouraged to wear oxygen and also weigh daily - explained the reasons for this.  Explained when to call doctors office and when to return to the hospital.  Pt given scripts and also told of recommended FU appointments.  Pt still waiting on oxygen to be delivered before DC can be completed.  Will then be wheeled to car by PCT and family.

## 2013-06-26 NOTE — ED Provider Notes (Signed)
CSN: [id number]     Arrival date & time 06/16/13  1456 History   First MD Initiated Contact with Patient 06/16/13 1522     Chief Complaint  Patient presents with  . Tachycardia     (Consider location/radiation/quality/duration/timing/severity/associated sxs/prior Treatment) Patient is a 73 y.o. male presenting with palpitations. The history is provided by the patient (the pt complains of sob and palpitaions).  Palpitations Palpitations quality:  Irregular Onset quality:  Unable to specify Timing:  Constant Progression:  Waxing and waning Chronicity:  Recurrent Context: not anxiety   Relieved by:  Nothing Associated symptoms: shortness of breath   Associated symptoms: no back pain, no chest pain and no cough     Past Medical History  Diagnosis Date  . Anemia     Hemoglobin 11.6 in 04/2008 -> resolved   . Tobacco abuse   . Glaucoma   . Allergic rhinitis   . Atrial fibrillation     Coumadin  . Diastolic heart failure     LVEF 30-86%, rate 2 diastolic dysfunction 07/7844  . Pulmonary hypertension     70 mmHg 03/2012  . Shingles   . Essential hypertension, benign   . COPD (chronic obstructive pulmonary disease)   . Asthma    Past Surgical History  Procedure Laterality Date  . Bilateral cataract surgery    . Herniorrhapy    . Tendon repair      Right hand surgical procedure for a tendon repair  . Colonoscopy N/A 11/29/2012    Procedure: COLONOSCOPY;  Surgeon: Danie Binder, MD;  Location: [facility];  Service: Endoscopy;  Laterality: N/A;  1:00  . Esophagogastroduodenoscopy N/A 11/29/2012    Procedure: ESOPHAGOGASTRODUODENOSCOPY (EGD);  Surgeon: Danie Binder, MD;  Location: [facility];  Service: Endoscopy;  Laterality: N/A;  . Eye surgery    . Hernia repair     Family History  Problem Relation Age of Onset  . Cancer Mother   . Cancer Father   . Coronary artery disease Brother   . Colon cancer Brother    History  Substance Use Topics  . Smoking status:  Current Some Day Smoker -- 0.20 packs/day for 55 years    Types: Cigarettes    Last Attempt to Quit: 02/13/2012  . Smokeless tobacco: Never Used     Comment: occasionally smokes  . Alcohol Use: No    Review of Systems  Constitutional: Negative for appetite change and fatigue.  HENT: Negative for congestion, ear discharge and sinus pressure.   Eyes: Negative for discharge.  Respiratory: Positive for shortness of breath. Negative for cough.   Cardiovascular: Positive for palpitations. Negative for chest pain.  Gastrointestinal: Negative for abdominal pain and diarrhea.  Genitourinary: Negative for frequency and hematuria.  Musculoskeletal: Negative for back pain.  Skin: Negative for rash.  Neurological: Negative for seizures and headaches.  Psychiatric/Behavioral: Negative for hallucinations.      Allergies  Aspirin  Home Medications   Prior to Admission medications   Medication Sig Start Date End Date Taking? Authorizing Provider  albuterol (PROAIR HFA) 108 (90 BASE) MCG/ACT inhaler Inhale 2 puffs into the lungs every 6 (six) hours as needed for wheezing or shortness of breath. 05/23/13  Yes Fayrene Helper, MD  albuterol (PROVENTIL) (2.5 MG/3ML) 0.083% nebulizer solution Take 2.5 mg by nebulization 2 (two) times daily.   Yes Historical Provider, MD  allopurinol (ZYLOPRIM) 300 MG tablet Take 1 tablet (300 mg total) by mouth daily. 05/23/13  Yes Norwood Levo  Moshe Cipro, MD  budesonide-formoterol [facility]) 160-4.5 MCG/ACT inhaler Inhale 2 puffs into the lungs 2 (two) times daily. 05/23/13  Yes Fayrene Helper, MD  metoprolol (LOPRESSOR) 50 MG tablet Take 1 tablet (50 mg total) by mouth 2 (two) times daily. 04/04/13  Yes Thurnell Lose, MD  mirtazapine (REMERON) 7.5 MG tablet Take 1 tablet (7.5 mg total) by mouth at bedtime. 05/21/13  Yes Fayrene Helper, MD  montelukast (SINGULAIR) 10 MG tablet Take 10 mg by mouth at bedtime.   Yes Historical Provider, MD  potassium chloride  (K-DUR) 10 MEQ tablet Take 4 tablets (40 mEq total) by mouth 2 (two) times daily. 06/13/13  Yes Tanna Furry, MD  tiotropium (SPIRIVA) 18 MCG inhalation capsule Place 18 mcg into inhaler and inhale daily.   Yes Historical Provider, MD  traMADol (ULTRAM) 50 MG tablet Take 1 tablet (50 mg total) by mouth daily as needed. 05/23/13 05/23/14 Yes Fayrene Helper, MD  travoprost, benzalkonium, (TRAVATAN Z) 0.004 % ophthalmic solution Place 1 drop into both eyes at bedtime.    Yes Historical Provider, MD  warfarin (COUMADIN) 4 MG tablet Take 4-6 mg by mouth See admin instructions. Patient takes 1 1/2 tablets on Thursdays, Saturdays, Mondays and Wednesdays. Patient takes 1 tablet on Fridays, Sundays, Tuesdays 05/15/13  Yes Historical Provider, MD  ALPRAZolam Duanne Moron) 0.25 MG tablet TAKE 1 TABLET BY MOUTH TWICE DAILY AS NEEDED    Fayrene Helper, MD  azithromycin (ZITHROMAX Z-PAK) 250 MG tablet Take 2 tabs on day one, then take 1 tab once daily until all the pills are gone 06/24/13   Janece Canterbury, MD  brimonidine-timolol (COMBIGAN) 0.2-0.5 % ophthalmic solution Place 1 drop into both eyes every 12 (twelve) hours. 06/23/13   Janece Canterbury, MD  diltiazem (CARDIZEM CD) 180 MG 24 hr capsule Take 1 capsule (180 mg total) by mouth daily. 06/23/13   Janece Canterbury, MD  predniSONE (DELTASONE) 20 MG tablet Take 2 tablets (40 mg total) by mouth daily with breakfast. 06/24/13   Janece Canterbury, MD  SPIRIVA HANDIHALER 18 MCG inhalation capsule INHALE CONTENTS OF [place]    Fayrene Helper, MD  torsemide (DEMADEX) 20 MG tablet Take 2 tablets (40 mg total) by mouth daily. 06/24/13   Janece Canterbury, MD   BP 124/84  Pulse 74  Temp(Src) 96.9 F (36.1 C) (Oral)  Resp 18  Ht 5\' 6"  (1.676 m)  Wt 142 lb 8 oz (64.638 kg)  BMI 23.01 kg/m2  SpO2 96% Physical Exam  Constitutional: He is oriented to person, place, and time. He appears well-developed.  HENT:  Head: Normocephalic.  Eyes:  Conjunctivae and EOM are normal. No scleral icterus.  Neck: Neck supple. No thyromegaly present.  Cardiovascular: Normal rate.  Exam reveals no gallop and no friction rub.   No murmur heard. Irregular heart beat  Pulmonary/Chest: No stridor. He has no wheezes. He has no rales. He exhibits no tenderness.  Abdominal: He exhibits no distension. There is no tenderness. There is no rebound.  Musculoskeletal: Normal range of motion. He exhibits edema.  Lymphadenopathy:    He has no cervical adenopathy.  Neurological: He is oriented to person, place, and time. He exhibits normal muscle tone. Coordination normal.  Skin: No rash noted. No erythema.  Psychiatric: He has a normal mood and affect. His behavior is normal.    ED Course  Procedures (including critical care time) Labs Review Labs Reviewed  CBC WITH DIFFERENTIAL - Abnormal; Notable for the  following:    RBC 3.34 (*)    Hemoglobin 9.3 (*)    HCT 30.4 (*)    RDW 16.5 (*)    Lymphocytes Relative 9 (*)    Monocytes Relative 15 (*)    Monocytes Absolute 1.5 (*)    All other components within normal limits  BASIC METABOLIC PANEL - Abnormal; Notable for the following:    Potassium 3.4 (*)    Glucose, Bld 107 (*)    GFR calc non Af Amer 65 (*)    GFR calc Af Amer 76 (*)    All other components within normal limits  PRO B NATRIURETIC PEPTIDE - Abnormal; Notable for the following:    Pro B Natriuretic peptide (BNP) 3560.0 (*)    All other components within normal limits  HEPATIC FUNCTION PANEL - Abnormal; Notable for the following:    Albumin 3.3 (*)    All other components within normal limits  PROTIME-INR - Abnormal; Notable for the following:    Prothrombin Time 21.2 (*)    INR 1.90 (*)    All other components within normal limits  COMPREHENSIVE METABOLIC PANEL - Abnormal; Notable for the following:    CO2 34 (*)    Albumin 2.7 (*)    GFR calc non Af Amer 68 (*)    GFR calc Af Amer 79 (*)    All other components within  normal limits  CBC - Abnormal; Notable for the following:    RBC 3.16 (*)    Hemoglobin 8.6 (*)    HCT 29.0 (*)    MCHC 29.7 (*)    RDW 16.5 (*)    All other components within normal limits  PRO B NATRIURETIC PEPTIDE - Abnormal; Notable for the following:    Pro B Natriuretic peptide (BNP) 3222.0 (*)    All other components within normal limits  PROTIME-INR - Abnormal; Notable for the following:    Prothrombin Time 24.9 (*)    INR 2.34 (*)    All other components within normal limits  PROTIME-INR - Abnormal; Notable for the following:    Prothrombin Time 25.2 (*)    INR 2.38 (*)    All other components within normal limits  IRON AND TIBC - Abnormal; Notable for the following:    Iron 33 (*)    Saturation Ratios 9 (*)    All other components within normal limits  PROTEIN ELECTROPHORESIS, SERUM - Abnormal; Notable for the following:    Albumin ELP 46.0 (*)    Alpha-1-Globulin 7.2 (*)    Beta Globulin 8.6 (*)    Beta 2 9.3 (*)    All other components within normal limits  FOLATE RBC - Abnormal; Notable for the following:    RBC Folate 863 (*)    All other components within normal limits  BASIC METABOLIC PANEL - Abnormal; Notable for the following:    CO2 33 (*)    Glucose, Bld 115 (*)    GFR calc non Af Amer 56 (*)    GFR calc Af Amer 65 (*)    All other components within normal limits  CBC - Abnormal; Notable for the following:    RBC 3.24 (*)    Hemoglobin 8.9 (*)    HCT 30.4 (*)    MCHC 29.3 (*)    RDW 16.5 (*)    All other components within normal limits  PROTIME-INR - Abnormal; Notable for the following:    Prothrombin Time 22.4 (*)    INR 2.04 (*)  All other components within normal limits  BASIC METABOLIC PANEL - Abnormal; Notable for the following:    Potassium 5.6 (*)    Chloride 94 (*)    CO2 36 (*)    Glucose, Bld 128 (*)    GFR calc non Af Amer 51 (*)    GFR calc Af Amer 60 (*)    All other components within normal limits  CBC - Abnormal; Notable  for the following:    RBC 3.36 (*)    Hemoglobin 9.2 (*)    HCT 31.2 (*)    MCHC 29.5 (*)    RDW 16.4 (*)    All other components within normal limits  PROTIME-INR - Abnormal; Notable for the following:    Prothrombin Time 23.7 (*)    INR 2.20 (*)    All other components within normal limits  BASIC METABOLIC PANEL - Abnormal; Notable for the following:    Chloride 92 (*)    CO2 40 (*)    Glucose, Bld 101 (*)    GFR calc non Af Amer 53 (*)    GFR calc Af Amer 61 (*)    All other components within normal limits  PROTIME-INR - Abnormal; Notable for the following:    Prothrombin Time 23.1 (*)    INR 2.12 (*)    All other components within normal limits  BASIC METABOLIC PANEL - Abnormal; Notable for the following:    Sodium 136 (*)    Potassium 3.4 (*)    Chloride 84 (*)    CO2 45 (*)    GFR calc non Af Amer 65 (*)    GFR calc Af Amer 76 (*)    All other components within normal limits  PROTIME-INR - Abnormal; Notable for the following:    Prothrombin Time 19.9 (*)    INR 1.75 (*)    All other components within normal limits  BASIC METABOLIC PANEL - Abnormal; Notable for the following:    Sodium 134 (*)    Potassium 3.4 (*)    Chloride 84 (*)    CO2 >45 (*)    Glucose, Bld 104 (*)    GFR calc non Af Amer 69 (*)    GFR calc Af Amer 80 (*)    All other components within normal limits  CBC - Abnormal; Notable for the following:    WBC 11.5 (*)    RBC 3.52 (*)    Hemoglobin 9.6 (*)    HCT 32.0 (*)    RDW 16.3 (*)    All other components within normal limits  PROTIME-INR - Abnormal; Notable for the following:    Prothrombin Time 22.1 (*)    INR 2.00 (*)    All other components within normal limits  BASIC METABOLIC PANEL - Abnormal; Notable for the following:    Chloride 86 (*)    CO2 45 (*)    Glucose, Bld 152 (*)    GFR calc non Af Amer 69 (*)    GFR calc Af Amer 80 (*)    All other components within normal limits  PROTIME-INR - Abnormal; Notable for the following:     Prothrombin Time 24.6 (*)    INR 2.31 (*)    All other components within normal limits  BASIC METABOLIC PANEL - Abnormal; Notable for the following:    Sodium 136 (*)    Chloride 87 (*)    CO2 44 (*)    Glucose, Bld 148 (*)    BUN 27 (*)  GFR calc non Af Amer 65 (*)    GFR calc Af Amer 76 (*)    All other components within normal limits  TROPONIN I  TSH  TROPONIN I  TROPONIN I  TROPONIN I  OCCULT BLOOD X 1 CARD TO LAB, STOOL  IMMUNOFIXATION, URINE  FERRITIN  TRANSFERRIN  VITAMIN B12  TSH  MAGNESIUM  MAGNESIUM  URINALYSIS, ROUTINE W REFLEX MICROSCOPIC    Imaging Review No results found.   EKG Interpretation   Date/Time:  Monday June 16 2013 15:34:14 EDT Ventricular Rate:  126 PR Interval:    QRS Duration: 92 QT Interval:  290 QTC Calculation: 420 R Axis:   -76 Text Interpretation:  Undetermined rhythm Left axis deviation Nonspecific  T wave abnormality Abnormal ECG When compared with ECG of 13-Jun-2013  11:03, Current undetermined rhythm precludes rhythm comparison, needs  review Nonspecific T wave abnormality, worse in Lateral leads Confirmed by  Angelissa Supan  MD, Joelene Barriere ([id number]) on 06/16/2013 5:21:04 PM      MDM   Final diagnoses:  CHF (congestive heart failure)        Maudry Diego, MD 06/26/13 1436

## 2013-07-01 ENCOUNTER — Other Ambulatory Visit: Payer: Self-pay

## 2013-07-01 ENCOUNTER — Encounter: Payer: Self-pay | Admitting: *Deleted

## 2013-07-01 ENCOUNTER — Ambulatory Visit (INDEPENDENT_AMBULATORY_CARE_PROVIDER_SITE_OTHER): Payer: Medicare Other | Admitting: Cardiovascular Disease

## 2013-07-01 ENCOUNTER — Ambulatory Visit (INDEPENDENT_AMBULATORY_CARE_PROVIDER_SITE_OTHER): Payer: Medicare Other | Admitting: *Deleted

## 2013-07-01 ENCOUNTER — Encounter: Payer: Self-pay | Admitting: Cardiovascular Disease

## 2013-07-01 VITALS — BP 172/88 | HR 83 | Ht 66.0 in | Wt 147.0 lb

## 2013-07-01 DIAGNOSIS — I5032 Chronic diastolic (congestive) heart failure: Secondary | ICD-10-CM

## 2013-07-01 DIAGNOSIS — I4729 Other ventricular tachycardia: Secondary | ICD-10-CM | POA: Insufficient documentation

## 2013-07-01 DIAGNOSIS — Z5181 Encounter for therapeutic drug level monitoring: Secondary | ICD-10-CM

## 2013-07-01 DIAGNOSIS — I4891 Unspecified atrial fibrillation: Secondary | ICD-10-CM

## 2013-07-01 DIAGNOSIS — Z9289 Personal history of other medical treatment: Secondary | ICD-10-CM

## 2013-07-01 DIAGNOSIS — I2789 Other specified pulmonary heart diseases: Secondary | ICD-10-CM

## 2013-07-01 DIAGNOSIS — R079 Chest pain, unspecified: Secondary | ICD-10-CM

## 2013-07-01 DIAGNOSIS — I472 Ventricular tachycardia: Secondary | ICD-10-CM

## 2013-07-01 DIAGNOSIS — Z7901 Long term (current) use of anticoagulants: Secondary | ICD-10-CM

## 2013-07-01 DIAGNOSIS — Z87898 Personal history of other specified conditions: Secondary | ICD-10-CM

## 2013-07-01 DIAGNOSIS — I10 Essential (primary) hypertension: Secondary | ICD-10-CM

## 2013-07-01 DIAGNOSIS — I272 Pulmonary hypertension, unspecified: Secondary | ICD-10-CM

## 2013-07-01 MED ORDER — HYDRALAZINE HCL 25 MG PO TABS: 25.0000 mg | ORAL_TABLET | Freq: Three times a day (TID) | ORAL | Status: DC

## 2013-07-01 MED ORDER — METOPROLOL TARTRATE 50 MG PO TABS: 50.0000 mg | ORAL_TABLET | Freq: Two times a day (BID) | ORAL | Status: DC

## 2013-07-01 NOTE — Progress Notes (Signed)
Patient ID: Keilyn Nadal Vanloan, male   DOB: [birth year], 73 y.o.   MRN: [id number]      SUBJECTIVE: The patient is a 73 year old male who was recently hospitalized for acute on chronic diastolic congestive heart failure and right heart failure. He has COPD with severe pulmonary hypertension and grade 2 diastolic dysfunction. He also has atrial fibrillation. During his hospitalization, he had chest soreness and runs of nonsustained ventricular tachycardia. At that time, I recommended an outpatient stress test to assess his ischemic burden. Discharge weight 64.6 kg.  He feels that his leg swelling has improved significantly. His chronic dyspnea with exertion is no worse than his baseline. He denies chest pain and palpitations.   Allergies  Allergen Reactions  . Aspirin     On coumadin    Current Outpatient Prescriptions  Medication Sig Dispense Refill  . albuterol (PROAIR HFA) 108 (90 BASE) MCG/ACT inhaler Inhale 2 puffs into the lungs every 6 (six) hours as needed for wheezing or shortness of breath.  6.7 g  4  . albuterol (PROVENTIL) (2.5 MG/3ML) 0.083% nebulizer solution Take 2.5 mg by nebulization 2 (two) times daily.      Marland Kitchen allopurinol (ZYLOPRIM) 300 MG tablet Take 1 tablet (300 mg total) by mouth daily.  30 tablet  6  . ALPRAZolam (XANAX) 0.25 MG tablet TAKE 1 TABLET BY MOUTH TWICE DAILY AS NEEDED  28 tablet  0  . azithromycin (ZITHROMAX Z-PAK) 250 MG tablet Take 2 tabs on day one, then take 1 tab once daily until all the pills are gone  6 each  0  . brimonidine-timolol (COMBIGAN) 0.2-0.5 % ophthalmic solution Place 1 drop into both eyes every 12 (twelve) hours.  5 mL  0  . budesonide-formoterol (SYMBICORT) 160-4.5 MCG/ACT inhaler Inhale 2 puffs into the lungs 2 (two) times daily.  1 Inhaler  4  . diltiazem (CARDIZEM CD) 180 MG 24 hr capsule Take 1 capsule (180 mg total) by mouth daily.  30 capsule  0  . metoprolol (LOPRESSOR) 50 MG tablet Take 1 tablet (50 mg total) by mouth 2 (two) times  daily.  60 tablet  1  . mirtazapine (REMERON) 7.5 MG tablet Take 1 tablet (7.5 mg total) by mouth at bedtime.  30 tablet  3  . montelukast (SINGULAIR) 10 MG tablet Take 10 mg by mouth at bedtime.      . potassium chloride (K-DUR) 10 MEQ tablet Take 4 tablets (40 mEq total) by mouth 2 (two) times daily.  7 tablet  0  . predniSONE (DELTASONE) 20 MG tablet Take 2 tablets (40 mg total) by mouth daily with breakfast.  10 tablet  0  . SPIRIVA HANDIHALER 18 MCG inhalation capsule INHALE CONTENTS OF 1 CAPSULE VIA HANDIHALER EVERY DAY  30 capsule  0  . tiotropium (SPIRIVA) 18 MCG inhalation capsule Place 18 mcg into inhaler and inhale daily.      Marland Kitchen torsemide (DEMADEX) 20 MG tablet Take 2 tablets (40 mg total) by mouth daily.  60 tablet  0  . traMADol (ULTRAM) 50 MG tablet Take 1 tablet (50 mg total) by mouth daily as needed.  20 tablet  0  . travoprost, benzalkonium, (TRAVATAN Z) 0.004 % ophthalmic solution Place 1 drop into both eyes at bedtime.       Marland Kitchen warfarin (COUMADIN) 4 MG tablet Take 4-6 mg by mouth See admin instructions. Patient takes 1 1/2 tablets on Thursdays, Saturdays, Mondays and Wednesdays. Patient takes 1 tablet on Fridays, Sundays, Tuesdays  No current facility-administered medications for this visit.    Past Medical History  Diagnosis Date  . Anemia     Hemoglobin 11.6 in 04/2008 -> resolved   . Tobacco abuse   . Glaucoma   . Allergic rhinitis   . Atrial fibrillation     Coumadin  . Diastolic heart failure     LVEF 70-96%, rate 2 diastolic dysfunction 04/8364  . Pulmonary hypertension     70 mmHg 03/2012  . Shingles   . Essential hypertension, benign   . COPD (chronic obstructive pulmonary disease)   . Asthma     Past Surgical History  Procedure Laterality Date  . Bilateral cataract surgery    . Herniorrhapy    . Tendon repair      Right hand surgical procedure for a tendon repair  . Colonoscopy N/A 11/29/2012    Procedure: COLONOSCOPY;  Surgeon: Danie Binder, MD;   [facility];  Service: Endoscopy;  Laterality: N/A;  1:00  . Esophagogastroduodenoscopy N/A 11/29/2012    Procedure: ESOPHAGOGASTRODUODENOSCOPY (EGD);  Surgeon: Danie Binder, MD;  Location: [facility];  Service: Endoscopy;  Laterality: N/A;  . Eye surgery    . Hernia repair      History   Social History  . Marital Status: Widowed    Spouse Name: N/A    Number of Children: 2  . Years of Education: N/A   Occupational History  . Retired   .     Social History Main Topics  . Smoking status: Current Some Day Smoker -- 0.20 packs/day for 55 years    Types: Cigarettes    Last Attempt to Quit: 02/13/2012  . Smokeless tobacco: Never Used     Comment: occasionally smokes  . Alcohol Use: No  . Drug Use: No  . Sexual Activity: No   Other Topics Concern  . Not on file   Social History Narrative  . No narrative on file     Filed Vitals:   07/01/13 1353  BP: 172/88  Pulse: 83  Height: 5\' 6"  (1.676 m)  Weight: 147 lb (66.679 kg)    PHYSICAL EXAM General: NAD Neck: No JVD, no thyromegaly. Lungs: Diminished b/l with prolonged expiratory phase and faint end-exp wheezes. CV: Nondisplaced PMI.  Irregular rhythm, normal rate, normal S1/S2, no S3, no murmur. 1+ pitting pretibial edema.   Abdomen: Soft, nontender, no hepatosplenomegaly, no distention.  Neurologic: Alert and oriented x 3.  Psych: Normal affect. Extremities: No clubbing or cyanosis.   ECG: reviewed and available in electronic records.      ASSESSMENT AND PLAN: 1. Chronic diastolic heart failure and right heart failure: Euvolemic today. Continue torsemide 40 mg daily. BP elevated, so will start hydralazine 25 mg tid. 2. NSVT and chest pain: Will obtain a Lexiscan Cardiolite stress test to assess ischemic burden. No chest pain at present. 3. HTN: Elevated in the setting of grade II diastolic dysfunction and recent hospitalization for heart failure. Start hydralazine 25 mg tid. 4. Atrial  fibrillation: Rate is controlled. On warfarin. Will check INR today.  Dispo: f/u 4-6 weeks.  Kate Sable, M.D., F.A.C.C.

## 2013-07-01 NOTE — Patient Instructions (Signed)
   Begin Hydralazine 25mg  three times per day - new sent to pharm Continue all other medications.   Your physician has requested that you have a lexiscan myoview. For further information please visit [email or web address]. Please follow instruction sheet, as given. Office will contact with results via phone or letter.   Follow up in  4-6 weeks

## 2013-07-03 ENCOUNTER — Encounter: Payer: Self-pay | Admitting: Family Medicine

## 2013-07-03 ENCOUNTER — Ambulatory Visit (INDEPENDENT_AMBULATORY_CARE_PROVIDER_SITE_OTHER): Payer: Medicare Other | Admitting: Family Medicine

## 2013-07-03 ENCOUNTER — Ambulatory Visit (INDEPENDENT_AMBULATORY_CARE_PROVIDER_SITE_OTHER): Payer: Medicare Other | Admitting: *Deleted

## 2013-07-03 VITALS — BP 122/58 | HR 100 | Resp 20 | Ht 67.5 in | Wt 146.0 lb

## 2013-07-03 DIAGNOSIS — I10 Essential (primary) hypertension: Secondary | ICD-10-CM

## 2013-07-03 DIAGNOSIS — F341 Dysthymic disorder: Secondary | ICD-10-CM

## 2013-07-03 DIAGNOSIS — J449 Chronic obstructive pulmonary disease, unspecified: Secondary | ICD-10-CM

## 2013-07-03 DIAGNOSIS — I4891 Unspecified atrial fibrillation: Secondary | ICD-10-CM

## 2013-07-03 DIAGNOSIS — Z5181 Encounter for therapeutic drug level monitoring: Secondary | ICD-10-CM | POA: Insufficient documentation

## 2013-07-03 DIAGNOSIS — F17200 Nicotine dependence, unspecified, uncomplicated: Secondary | ICD-10-CM

## 2013-07-03 DIAGNOSIS — H409 Unspecified glaucoma: Secondary | ICD-10-CM

## 2013-07-03 DIAGNOSIS — Z720 Tobacco use: Secondary | ICD-10-CM

## 2013-07-03 DIAGNOSIS — I509 Heart failure, unspecified: Secondary | ICD-10-CM

## 2013-07-03 DIAGNOSIS — F418 Other specified anxiety disorders: Secondary | ICD-10-CM

## 2013-07-03 LAB — POCT INR
INR: 4.4
INR: 6.1

## 2013-07-03 NOTE — Patient Instructions (Addendum)
F/u in 3 month, call if you need me before   Please continue to use your oxygen certainly at night, and you really need to use it all the time   Please stop smoking  Keep eye  Appointment

## 2013-07-04 ENCOUNTER — Encounter (HOSPITAL_COMMUNITY): Payer: Self-pay | Admitting: Emergency Medicine

## 2013-07-04 ENCOUNTER — Emergency Department (HOSPITAL_COMMUNITY)
Admission: EM | Admit: 2013-07-04 | Discharge: 2013-07-04 | Disposition: A | Discharge status: Home / Self Care | Payer: Medicare Other | Attending: Emergency Medicine | Admitting: Emergency Medicine

## 2013-07-04 DIAGNOSIS — I5030 Unspecified diastolic (congestive) heart failure: Secondary | ICD-10-CM | POA: Insufficient documentation

## 2013-07-04 DIAGNOSIS — Z8639 Personal history of other endocrine, nutritional and metabolic disease: Secondary | ICD-10-CM | POA: Insufficient documentation

## 2013-07-04 DIAGNOSIS — J45909 Unspecified asthma, uncomplicated: Secondary | ICD-10-CM | POA: Insufficient documentation

## 2013-07-04 DIAGNOSIS — Z862 Personal history of diseases of the blood and blood-forming organs and certain disorders involving the immune mechanism: Secondary | ICD-10-CM | POA: Insufficient documentation

## 2013-07-04 DIAGNOSIS — I2789 Other specified pulmonary heart diseases: Secondary | ICD-10-CM | POA: Insufficient documentation

## 2013-07-04 DIAGNOSIS — H409 Unspecified glaucoma: Secondary | ICD-10-CM | POA: Insufficient documentation

## 2013-07-04 DIAGNOSIS — IMO0002 Reserved for concepts with insufficient information to code with codable children: Secondary | ICD-10-CM | POA: Insufficient documentation

## 2013-07-04 DIAGNOSIS — R040 Epistaxis: Secondary | ICD-10-CM | POA: Insufficient documentation

## 2013-07-04 DIAGNOSIS — Z79899 Other long term (current) drug therapy: Secondary | ICD-10-CM | POA: Insufficient documentation

## 2013-07-04 DIAGNOSIS — Z8619 Personal history of other infectious and parasitic diseases: Secondary | ICD-10-CM | POA: Insufficient documentation

## 2013-07-04 DIAGNOSIS — F17200 Nicotine dependence, unspecified, uncomplicated: Secondary | ICD-10-CM | POA: Insufficient documentation

## 2013-07-04 LAB — CBC WITH DIFFERENTIAL/PLATELET
BASOS PCT: 0 % (ref 0–1)
Basophils Absolute: 0 10*3/uL (ref 0.0–0.1)
EOS ABS: 0.3 10*3/uL (ref 0.0–0.7)
Eosinophils Relative: 3 % (ref 0–5)
HCT: 34.7 % — ABNORMAL LOW (ref 39.0–52.0)
Hemoglobin: 10.3 g/dL — ABNORMAL LOW (ref 13.0–17.0)
Lymphocytes Relative: 10 % — ABNORMAL LOW (ref 12–46)
Lymphs Abs: 1.2 10*3/uL (ref 0.7–4.0)
MCH: 27.2 pg (ref 26.0–34.0)
MCHC: 29.7 g/dL — AB (ref 30.0–36.0)
MCV: 91.6 fL (ref 78.0–100.0)
Monocytes Absolute: 1.3 10*3/uL — ABNORMAL HIGH (ref 0.1–1.0)
Monocytes Relative: 11 % (ref 3–12)
NEUTROS PCT: 76 % (ref 43–77)
Neutro Abs: 8.7 10*3/uL — ABNORMAL HIGH (ref 1.7–7.7)
PLATELETS: 345 10*3/uL (ref 150–400)
RBC: 3.79 MIL/uL — ABNORMAL LOW (ref 4.22–5.81)
RDW: 17.7 % — AB (ref 11.5–15.5)
WBC: 11.5 10*3/uL — ABNORMAL HIGH (ref 4.0–10.5)

## 2013-07-04 LAB — BASIC METABOLIC PANEL
BUN: 24 mg/dL — ABNORMAL HIGH (ref 6–23)
CO2: 40 mEq/L (ref 19–32)
Calcium: 9.7 mg/dL (ref 8.4–10.5)
Chloride: 92 mEq/L — ABNORMAL LOW (ref 96–112)
Creatinine, Ser: 1.12 mg/dL (ref 0.50–1.35)
GFR, EST AFRICAN AMERICAN: 73 mL/min — AB (ref >90)
GFR, EST NON AFRICAN AMERICAN: 63 mL/min — AB (ref >90)
Glucose, Bld: 100 mg/dL — ABNORMAL HIGH (ref 70–99)
Potassium: 3.6 mEq/L — ABNORMAL LOW (ref 3.7–5.3)
SODIUM: 142 meq/L (ref 137–147)

## 2013-07-04 LAB — PROTIME-INR
INR: 2.33 — AB (ref 0.00–1.49)
Prothrombin Time: 24.8 seconds — ABNORMAL HIGH (ref 11.6–15.2)

## 2013-07-04 LAB — PROTEIN ELECTROPHORESIS, SERUM
ALBUMIN ELP: 46 % — AB (ref 55.8–66.1)
ALPHA-1-GLOBULIN: 7.2 % — AB (ref 2.9–4.9)
Alpha-2-Globulin: 11.5 % (ref 7.1–11.8)
Beta 2: 9.3 % — ABNORMAL HIGH (ref 3.2–6.5)
Beta Globulin: 8.6 % — ABNORMAL HIGH (ref 4.7–7.2)
Gamma Globulin: 17.4 % (ref 11.1–18.8)
M-Spike, %: NOT DETECTED g/dL
Total Protein ELP: 6.4 g/dL (ref 6.0–8.3)

## 2013-07-04 LAB — IRON AND TIBC
Iron: 33 ug/dL — ABNORMAL LOW (ref 42–135)
Saturation Ratios: 9 % — ABNORMAL LOW (ref 20–55)
TIBC: 354 ug/dL (ref 215–435)
UIBC: 321 ug/dL (ref 125–400)

## 2013-07-04 LAB — VITAMIN B12: Vitamin B-12: 804 pg/mL (ref 211–911)

## 2013-07-04 LAB — FERRITIN: Ferritin: 60 ng/mL (ref 22–322)

## 2013-07-04 MED ORDER — SODIUM CHLORIDE 0.9 % IV SOLN: INTRAVENOUS | Status: DC

## 2013-07-04 MED ORDER — OXYMETAZOLINE HCL 0.05 % NA SOLN
3.0000 | Freq: Once | NASAL | Status: AC
  Administered 2013-07-04: 3 via NASAL
  Filled 2013-07-04: qty 15

## 2013-07-04 NOTE — ED Notes (Addendum)
Patient sitting on side of bed at this time. Vitals in normal limits. Nose not bleeding at this time. Patient states that he put packing in his nose.(Paper towels)

## 2013-07-04 NOTE — ED Notes (Signed)
CRITICAL VALUE ALERT  Critical value received:  co2 40  Date of notification:  07/04/2013  Time of notification:  2050  Critical value read back:yes  Nurse who received alert:  Joellyn Rued, RN  MD notified (1st page):  Dr Eulis Foster  Time of first page:  2050  MD notified (2nd page):  Time of second page:  Responding MD:  Dr Eulis Foster  Time MD responded:  2050

## 2013-07-04 NOTE — ED Notes (Addendum)
Patient c/o epistaxis that started this morning at 7 but stopped and started again at 1530-1600. Patient takes coumadin but states that he has not taken it in 3 days. Patient has been here for same reason in past and had to be transferred to [facility]. Right Nostril actively bleeding. Patient applying pressure.

## 2013-07-04 NOTE — ED Notes (Signed)
No further bleeding noted

## 2013-07-04 NOTE — ED Notes (Signed)
Paper towel packing in right nares removed by Lethea Killings RN. No further bleeding noted

## 2013-07-04 NOTE — Discharge Instructions (Signed)
Avoid using the oxygen, in your nose, for 2 days. Use Vaseline, or petroleum jelly, inside your nose to help decrease the irritation. Return here, if needed, for problems.   Nosebleed Nosebleeds can be caused by many conditions including trauma, infections, polyps, foreign bodies, dry mucous membranes or climate, medications and air conditioning. Most nosebleeds occur in the front of the nose. It is because of this location that most nosebleeds can be controlled by pinching the nostrils gently and continuously. Do this for at least 10 to 20 minutes. The reason for this long continuous pressure is that you must hold it long enough for the blood to clot. If during that 10 to 20 minute time period, pressure is released, the process may have to be started again. The nosebleed may stop by itself, quit with pressure, need concentrated heating (cautery) or stop with pressure from packing. HOME CARE INSTRUCTIONS   If your nose was packed, try to maintain the pack inside until your caregiver removes it. If a gauze pack was used and it starts to fall out, gently replace or cut the end off. Do not cut if a balloon catheter was used to pack the nose. Otherwise, do not remove unless instructed.  Avoid blowing your nose for 12 hours after treatment. This could dislodge the pack or clot and start bleeding again.  If the bleeding starts again, sit up and bending forward, gently pinch the front half of your nose continuously for 20 minutes.  If bleeding was caused by dry mucous membranes, cover the inside of your nose every morning with a petroleum or antibiotic ointment. Use your little fingertip as an applicator. Do this as needed during dry weather. This will keep the mucous membranes moist and allow them to heal.  Maintain humidity in your home by using less air conditioning or using a humidifier.  Do not use aspirin or medications which make bleeding more likely. Your caregiver can give you recommendations on  this.  Resume normal activities as able but try to avoid straining, lifting or bending at the waist for several days.  If the nosebleeds become recurrent and the cause is unknown, your caregiver may suggest laboratory tests. SEEK IMMEDIATE MEDICAL CARE IF:   Bleeding recurs and cannot be controlled.  There is unusual bleeding from or bruising on other parts of the body.  You have a fever.  Nosebleeds continue.  There is any worsening of the condition which originally brought you in.  You become lightheaded, feel faint, become sweaty or vomit blood. MAKE SURE YOU:   Understand these instructions.  Will watch your condition.  Will get help right away if you are not doing well or get worse. Document Released: 12/07/2004 Document Revised: 05/22/2011 Document Reviewed: 01/29/2009 [facility] Patient Information 2014 [place], [place].

## 2013-07-04 NOTE — ED Provider Notes (Signed)
CSN: [id number]     Arrival date & time 07/04/13  1750 History   First MD Initiated Contact with Patient 07/04/13 1938     Chief Complaint  Patient presents with  . Epistaxis     (Consider location/radiation/quality/duration/timing/severity/associated sxs/prior Treatment) Patient is a 73 y.o. male presenting with nosebleeds. The history is provided by the patient.  Epistaxis  He complains of nose bleeding, both nostrils, which started this morning. He has been on oxygen, for one week. He has his INR checked yesterday and was told to stop taking his Coumadin for 2 days because it was elevated. He denies fever, chills, weakness, dizziness, chest pain, or back pain. He's taking his medications as prescribed.    Past Medical History  Diagnosis Date  . Anemia     Hemoglobin 11.6 in 04/2008 -> resolved   . Tobacco abuse   . Glaucoma   . Allergic rhinitis   . Atrial fibrillation     Coumadin  . Diastolic heart failure     LVEF 28-41%, rate 2 diastolic dysfunction 05/2438  . Pulmonary hypertension     70 mmHg 03/2012  . Shingles   . Essential hypertension, benign   . COPD (chronic obstructive pulmonary disease)   . Asthma    Past Surgical History  Procedure Laterality Date  . Bilateral cataract surgery    . Herniorrhapy    . Tendon repair      Right hand surgical procedure for a tendon repair  . Colonoscopy N/A 11/29/2012    Procedure: COLONOSCOPY;  Surgeon: Danie Binder, MD;  [facility];  Service: Endoscopy;  Laterality: N/A;  1:00  . Esophagogastroduodenoscopy N/A 11/29/2012    Procedure: ESOPHAGOGASTRODUODENOSCOPY (EGD);  Surgeon: Danie Binder, MD;  [facility];  Service: Endoscopy;  Laterality: N/A;  . Eye surgery    . Hernia repair     Family History  Problem Relation Age of Onset  . Cancer Mother   . Cancer Father   . Coronary artery disease Brother   . Colon cancer Brother    History  Substance Use Topics  . Smoking status: Current Some  Day Smoker -- 0.20 packs/day for 55 years    Types: Cigarettes    Last Attempt to Quit: 02/13/2012  . Smokeless tobacco: Never Used     Comment: occasionally smokes  . Alcohol Use: No    Review of Systems  HENT: Positive for nosebleeds.   All other systems reviewed and are negative.     Allergies  Aspirin  Home Medications   Prior to Admission medications   Medication Sig Start Date End Date Taking? Authorizing Provider  albuterol (PROAIR HFA) 108 (90 BASE) MCG/ACT inhaler Inhale 2 puffs into the lungs every 6 (six) hours as needed for wheezing or shortness of breath. 05/23/13   Fayrene Helper, MD  albuterol (PROVENTIL) (2.5 MG/3ML) 0.083% nebulizer solution Take 2.5 mg by nebulization 2 (two) times daily.    Historical Provider, MD  allopurinol (ZYLOPRIM) 300 MG tablet Take 1 tablet (300 mg total) by mouth daily. 05/23/13   Fayrene Helper, MD  ALPRAZolam Duanne Moron) 0.25 MG tablet TAKE 1 TABLET BY MOUTH TWICE DAILY AS NEEDED    Fayrene Helper, MD  brimonidine-timolol (COMBIGAN) 0.2-0.5 % ophthalmic solution Place 1 drop into both eyes every 12 (twelve) hours. 06/23/13   Janece Canterbury, MD  budesonide-formoterol [facility]) 160-4.5 MCG/ACT inhaler Inhale 2 puffs into the lungs 2 (two) times daily. 05/23/13   Joycelyn Schmid  Bartholome Bill, MD  diltiazem (CARDIZEM CD) 180 MG 24 hr capsule Take 1 capsule (180 mg total) by mouth daily. 06/23/13   Janece Canterbury, MD  hydrALAZINE (APRESOLINE) 25 MG tablet Take 1 tablet (25 mg total) by mouth 3 (three) times daily. 07/01/13   Herminio Commons, MD  metoprolol (LOPRESSOR) 50 MG tablet Take 1 tablet (50 mg total) by mouth 2 (two) times daily. 07/01/13   Fayrene Helper, MD  mirtazapine (REMERON) 7.5 MG tablet Take 1 tablet (7.5 mg total) by mouth at bedtime. 05/21/13   Fayrene Helper, MD  montelukast (SINGULAIR) 10 MG tablet Take 10 mg by mouth at bedtime.    Historical Provider, MD  potassium chloride (K-DUR) 10 MEQ tablet Take 4 tablets (40  mEq total) by mouth 2 (two) times daily. 06/13/13   Tanna Furry, MD  predniSONE (DELTASONE) 20 MG tablet Take 2 tablets (40 mg total) by mouth daily with breakfast. 06/24/13   Janece Canterbury, MD  SPIRIVA HANDIHALER 18 MCG inhalation capsule INHALE CONTENTS OF Azure E Simpson, MD  tiotropium (SPIRIVA) 18 MCG inhalation capsule Place 18 mcg into inhaler and inhale daily.    Historical Provider, MD  torsemide (DEMADEX) 20 MG tablet Take 2 tablets (40 mg total) by mouth daily. 06/24/13   Janece Canterbury, MD  traMADol (ULTRAM) 50 MG tablet Take 1 tablet (50 mg total) by mouth daily as needed. 05/23/13 05/23/14  Fayrene Helper, MD  travoprost, benzalkonium, (TRAVATAN Z) 0.004 % ophthalmic solution Place 1 drop into both eyes at bedtime.     Historical Provider, MD  warfarin (COUMADIN) 4 MG tablet Take 4-6 mg by mouth See admin instructions. Patient takes 1 1/2 tablets on Thursdays, Saturdays, Mondays and Wednesdays. Patient takes 1 tablet on Fridays, Sundays, Tuesdays 05/15/13   Historical Provider, MD   BP 122/61  Pulse 72  Temp(Src) 98.2 F (36.8 C) (Oral)  Resp 16  Ht 5\' 6"  (1.676 m)  Wt 145 lb (65.772 kg)  BMI 23.41 kg/m2  SpO2 97% Physical Exam  Nursing note and vitals reviewed. Constitutional: He is oriented to person, place, and time. He appears well-developed and well-nourished.  HENT:  Head: Normocephalic and atraumatic.  Right Ear: External ear normal.  Left Ear: External ear normal.  Mild oozing of blood from both nares  Eyes: Conjunctivae and EOM are normal. Pupils are equal, round, and reactive to light.  Neck: Normal range of motion and phonation normal. Neck supple.  Cardiovascular: Normal rate, regular rhythm, normal heart sounds and intact distal pulses.   Pulmonary/Chest: Effort normal and breath sounds normal. He exhibits no bony tenderness.  Abdominal: Soft. There is no tenderness.  Musculoskeletal: Normal range of motion.   Neurological: He is alert and oriented to person, place, and time. No cranial nerve deficit or sensory deficit. He exhibits normal muscle tone. Coordination normal.  Skin: Skin is warm, dry and intact.  Psychiatric: He has a normal mood and affect. His behavior is normal. Judgment and thought content normal.    ED Course  Procedures (including critical care time)  Medications  0.9 %  sodium chloride infusion (not administered)  oxymetazoline (AFRIN) 0.05 % nasal spray 3 spray (3 sprays Each Nare Given 07/04/13 2150)    Exam after instillation of Afrin- no active bleeding. Mild excoriation, left nasal septum anteriorly. No nasal discharge. No clots in the posterior oropharynx.  Patient Vitals for the past 24 hrs:  BP Temp Temp  src Pulse Resp SpO2 Height Weight  07/04/13 1940 - - - 72 16 97 % - -  07/04/13 1927 122/61 mmHg - - 49 18 93 % - -  07/04/13 1841 124/74 mmHg 98.2 F (36.8 C) Oral 78 18 85 % 5\' 6"  (1.676 m) 145 lb (65.772 kg)    10:03 PM Reevaluation with update and discussion. After initial assessment and treatment, an updated evaluation reveals no nasal bleeding, now, oxygen saturation normal on room air. I applied petroleum jelly to his nares bilaterally. [place] Review Labs Reviewed  CBC WITH DIFFERENTIAL - Abnormal; Notable for the following:    WBC 11.5 (*)    RBC 3.79 (*)    Hemoglobin 10.3 (*)    HCT 34.7 (*)    MCHC 29.7 (*)    RDW 17.7 (*)    Neutro Abs 8.7 (*)    Lymphocytes Relative 10 (*)    Monocytes Absolute 1.3 (*)    All other components within normal limits  BASIC METABOLIC PANEL - Abnormal; Notable for the following:    Potassium 3.6 (*)    Chloride 92 (*)    CO2 40 (*)    Glucose, Bld 100 (*)    BUN 24 (*)    GFR calc non Af Amer 63 (*)    GFR calc Af Amer 73 (*)    All other components within normal limits  PROTIME-INR - Abnormal; Notable for the following:    Prothrombin Time 24.8 (*)    INR 2.33 (*)    All other  components within normal limits    Imaging Review No results found.   EKG Interpretation None      MDM   Final diagnoses:  None    Epistaxis after recent initiation of oxygen therapy. Cause is likely nasal irritation from oxygen. Hemoglobin is improved, and the Coumadin level is therapeutic. His bleeding seems to be from an anterior source, and would be amenable to treatment with pressure, if he bleeds again.   Nursing Notes Reviewed/ Care Coordinated Applicable Imaging Reviewed Interpretation of Laboratory Data incorporated into ED treatment  The patient appears reasonably screened and/or stabilized for discharge and I doubt any other medical condition or other [facility] requiring further screening, evaluation, or treatment in the ED at this time prior to discharge.  Plan: Home Medications- usual; Home Treatments- rest, nasal compression, when necessary; return here if the recommended treatment, does not improve the symptoms; Recommended follow up- PCP, for check up as needed. Return here as needed    Richarda Blade, MD 07/04/13 2224

## 2013-07-05 ENCOUNTER — Emergency Department (HOSPITAL_COMMUNITY)
Admission: EM | Admit: 2013-07-05 | Discharge: 2013-07-05 | Disposition: A | Discharge status: Home / Self Care | Payer: Medicare Other | Source: Home / Self Care | Attending: Emergency Medicine | Admitting: Emergency Medicine

## 2013-07-05 ENCOUNTER — Telehealth: Payer: Self-pay | Admitting: Internal Medicine

## 2013-07-05 DIAGNOSIS — I10 Essential (primary) hypertension: Secondary | ICD-10-CM

## 2013-07-05 DIAGNOSIS — Z8619 Personal history of other infectious and parasitic diseases: Secondary | ICD-10-CM

## 2013-07-05 DIAGNOSIS — R040 Epistaxis: Secondary | ICD-10-CM | POA: Insufficient documentation

## 2013-07-05 DIAGNOSIS — F17200 Nicotine dependence, unspecified, uncomplicated: Secondary | ICD-10-CM

## 2013-07-05 DIAGNOSIS — Z7901 Long term (current) use of anticoagulants: Secondary | ICD-10-CM | POA: Insufficient documentation

## 2013-07-05 DIAGNOSIS — I2789 Other specified pulmonary heart diseases: Secondary | ICD-10-CM

## 2013-07-05 DIAGNOSIS — J449 Chronic obstructive pulmonary disease, unspecified: Secondary | ICD-10-CM

## 2013-07-05 DIAGNOSIS — I4891 Unspecified atrial fibrillation: Secondary | ICD-10-CM

## 2013-07-05 DIAGNOSIS — I5030 Unspecified diastolic (congestive) heart failure: Secondary | ICD-10-CM | POA: Insufficient documentation

## 2013-07-05 DIAGNOSIS — IMO0002 Reserved for concepts with insufficient information to code with codable children: Secondary | ICD-10-CM

## 2013-07-05 DIAGNOSIS — D649 Anemia, unspecified: Secondary | ICD-10-CM

## 2013-07-05 DIAGNOSIS — H409 Unspecified glaucoma: Secondary | ICD-10-CM | POA: Insufficient documentation

## 2013-07-05 DIAGNOSIS — Z79899 Other long term (current) drug therapy: Secondary | ICD-10-CM | POA: Insufficient documentation

## 2013-07-05 DIAGNOSIS — J4489 Other specified chronic obstructive pulmonary disease: Secondary | ICD-10-CM | POA: Insufficient documentation

## 2013-07-05 LAB — BASIC METABOLIC PANEL
BUN: 26 mg/dL — ABNORMAL HIGH (ref 6–23)
CHLORIDE: 92 meq/L — AB (ref 96–112)
CO2: 36 mEq/L — ABNORMAL HIGH (ref 19–32)
CREATININE: 1.25 mg/dL (ref 0.50–1.35)
Calcium: 9.3 mg/dL (ref 8.4–10.5)
GFR calc non Af Amer: 55 mL/min — ABNORMAL LOW (ref >90)
GFR, EST AFRICAN AMERICAN: 64 mL/min — AB (ref >90)
Glucose, Bld: 166 mg/dL — ABNORMAL HIGH (ref 70–99)
Potassium: 3.3 mEq/L — ABNORMAL LOW (ref 3.7–5.3)
Sodium: 141 mEq/L (ref 137–147)

## 2013-07-05 LAB — CBC WITH DIFFERENTIAL/PLATELET
BASOS ABS: 0 10*3/uL (ref 0.0–0.1)
BASOS PCT: 0 % (ref 0–1)
Eosinophils Absolute: 0.3 10*3/uL (ref 0.0–0.7)
Eosinophils Relative: 2 % (ref 0–5)
HCT: 30.7 % — ABNORMAL LOW (ref 39.0–52.0)
Hemoglobin: 9.1 g/dL — ABNORMAL LOW (ref 13.0–17.0)
Lymphocytes Relative: 12 % (ref 12–46)
Lymphs Abs: 1.3 10*3/uL (ref 0.7–4.0)
MCH: 26.8 pg (ref 26.0–34.0)
MCHC: 29.6 g/dL — ABNORMAL LOW (ref 30.0–36.0)
MCV: 90.6 fL (ref 78.0–100.0)
Monocytes Absolute: 1.1 10*3/uL — ABNORMAL HIGH (ref 0.1–1.0)
Monocytes Relative: 10 % (ref 3–12)
NEUTROS ABS: 8.2 10*3/uL — AB (ref 1.7–7.7)
NEUTROS PCT: 76 % (ref 43–77)
PLATELETS: 301 10*3/uL (ref 150–400)
RBC: 3.39 MIL/uL — ABNORMAL LOW (ref 4.22–5.81)
RDW: 18.1 % — AB (ref 11.5–15.5)
WBC: 10.9 10*3/uL — ABNORMAL HIGH (ref 4.0–10.5)

## 2013-07-05 LAB — PROTIME-INR
INR: 1.84 — ABNORMAL HIGH (ref 0.00–1.49)
PROTHROMBIN TIME: 20.7 s — AB (ref 11.6–15.2)

## 2013-07-05 MED ORDER — FERROUS SULFATE 325 (65 FE) MG PO TABS: 325.0000 mg | ORAL_TABLET | Freq: Two times a day (BID) | ORAL | Status: DC

## 2013-07-05 MED ORDER — PHENYLEPHRINE HCL 1 % NA SOLN
2.0000 [drp] | Freq: Once | NASAL | Status: AC
  Administered 2013-07-05: 2 [drp] via NASAL
  Filled 2013-07-05: qty 15

## 2013-07-05 MED ORDER — POTASSIUM CHLORIDE CRYS ER 20 MEQ PO TBCR
40.0000 meq | EXTENDED_RELEASE_TABLET | Freq: Once | ORAL | Status: AC
  Administered 2013-07-05: 40 meq via ORAL
  Filled 2013-07-05: qty 2

## 2013-07-05 NOTE — Telephone Encounter (Signed)
Patient notified that he is iron deficient.  Called in Rx to [facility] in [place] for iron tabs BID.

## 2013-07-05 NOTE — ED Notes (Signed)
Patient states he thinks the bleeding has stopped.  Patient ambulatory to bathroom.

## 2013-07-05 NOTE — ED Provider Notes (Signed)
CSN: [id number]     Arrival date & time 07/05/13  1750 History   This chart was scribed for Maudry Diego, MD by Randa Evens, ED Scribe. This patient was seen in room APA03/APA03 and the patient's care was started at 6:11 PM.   Chief Complaint  Patient presents with  . Epistaxis    Patient is a 73 y.o. male presenting with nosebleeds. The history is provided by the patient. No language interpreter was used.  Epistaxis Location:  R nare Severity:  Moderate Duration:  1 hour Timing:  Intermittent Progression:  Improving Chronicity:  Recurrent Context: home oxygen and hypertension   Relieved by:  Nasal tampon Worsened by:  Nothing tried Ineffective treatments:  None tried Associated symptoms: blood in oropharynx   Associated symptoms: no congestion, no cough and no headaches    HPI Comments: Theodore [name] is a 72 y.o. male who presents to the Emergency Department complaining of epistaxis from his right nare that started about 1 hr ago. Pt was also in ed yesterday for similar symptoms.   Past Medical History  Diagnosis Date  . Anemia     Hemoglobin 11.6 in 04/2008 -> resolved   . Tobacco abuse   . Glaucoma   . Allergic rhinitis   . Atrial fibrillation     Coumadin  . Diastolic heart failure     LVEF 25-85%, rate 2 diastolic dysfunction 04/7780  . Pulmonary hypertension     70 mmHg 03/2012  . Shingles   . Essential hypertension, benign   . COPD (chronic obstructive pulmonary disease)   . Asthma    Past Surgical History  Procedure Laterality Date  . Bilateral cataract surgery    . Herniorrhapy    . Tendon repair      Right hand surgical procedure for a tendon repair  . Colonoscopy N/A 11/29/2012    Procedure: COLONOSCOPY;  Surgeon: Danie Binder, MD;  Location: [facility];  Service: Endoscopy;  Laterality: N/A;  1:00  . Esophagogastroduodenoscopy N/A 11/29/2012    Procedure: ESOPHAGOGASTRODUODENOSCOPY (EGD);  Surgeon: Danie Binder, MD;  Location: [facility];  Service: Endoscopy;  Laterality: N/A;  . Eye surgery    . Hernia repair     Family History  Problem Relation Age of Onset  . Cancer Mother   . Cancer Father   . Coronary artery disease Brother   . Colon cancer Brother    History  Substance Use Topics  . Smoking status: Current Some Day Smoker -- 0.20 packs/day for 55 years    Types: Cigarettes    Last Attempt to Quit: 02/13/2012  . Smokeless tobacco: Never Used     Comment: occasionally smokes  . Alcohol Use: No    Review of Systems  Constitutional: Negative for appetite change and fatigue.  HENT: Positive for nosebleeds. Negative for congestion, ear discharge and sinus pressure.   Eyes: Negative for discharge.  Respiratory: Negative for cough.   Cardiovascular: Negative for chest pain.  Gastrointestinal: Negative for abdominal pain and diarrhea.  Genitourinary: Negative for frequency and hematuria.  Musculoskeletal: Negative for back pain.  Skin: Negative for rash.  Neurological: Negative for seizures and headaches.  Psychiatric/Behavioral: Negative for hallucinations.      Allergies  Aspirin  Home Medications   Prior to Admission medications   Medication Sig Start Date End Date Taking? Authorizing Provider  albuterol (PROAIR HFA) 108 (90 BASE) MCG/ACT inhaler Inhale 2 puffs into the lungs every 6 (six) hours as  needed for wheezing or shortness of breath. 05/23/13   Fayrene Helper, MD  albuterol (PROVENTIL) (2.5 MG/3ML) 0.083% nebulizer solution Take 2.5 mg by nebulization 2 (two) times daily.    Historical Provider, MD  allopurinol (ZYLOPRIM) 300 MG tablet Take 1 tablet (300 mg total) by mouth daily. 05/23/13   Fayrene Helper, MD  ALPRAZolam Duanne Moron) 0.25 MG tablet Take 0.25 mg by mouth 2 (two) times daily as needed for anxiety.    Historical Provider, MD  azithromycin (ZITHROMAX) 250 MG tablet Take 250-500 mg by mouth See admin instructions. Take two tablets on day 1 then take one tablet on days 2  through 5 06/24/13   Historical Provider, MD  brimonidine-timolol (COMBIGAN) 0.2-0.5 % ophthalmic solution Place 1 drop into both eyes every 12 (twelve) hours. 06/23/13   Janece Canterbury, MD  budesonide-formoterol [facility]) 160-4.5 MCG/ACT inhaler Inhale 2 puffs into the lungs 2 (two) times daily. 05/23/13   Fayrene Helper, MD  diltiazem (CARDIZEM CD) 180 MG 24 hr capsule Take 1 capsule (180 mg total) by mouth daily. 06/23/13   Janece Canterbury, MD  ferrous sulfate 325 (65 FE) MG tablet Take 1 tablet (325 mg total) by mouth 2 (two) times daily with a meal. 07/05/13 07/05/14  Janece Canterbury, MD  furosemide (LASIX) 40 MG tablet Take 80 mg by mouth daily. 06/09/13   Historical Provider, MD  hydrALAZINE (APRESOLINE) 25 MG tablet Take 1 tablet (25 mg total) by mouth 3 (three) times daily. 07/01/13   Herminio Commons, MD  metoprolol (LOPRESSOR) 50 MG tablet Take 1 tablet (50 mg total) by mouth 2 (two) times daily. 07/01/13   Fayrene Helper, MD  mirtazapine (REMERON) 7.5 MG tablet Take 1 tablet (7.5 mg total) by mouth at bedtime. 05/21/13   Fayrene Helper, MD  montelukast (SINGULAIR) 10 MG tablet Take 10 mg by mouth at bedtime.    Historical Provider, MD  potassium chloride (K-DUR) 10 MEQ tablet Take 4 tablets (40 mEq total) by mouth 2 (two) times daily. 06/13/13   Tanna Furry, MD  predniSONE (DELTASONE) 20 MG tablet Take 2 tablets (40 mg total) by mouth daily with breakfast. 06/24/13   Janece Canterbury, MD  temazepam (RESTORIL) 15 MG capsule Take 15 mg by mouth at bedtime. 06/25/13   Historical Provider, MD  tiotropium (SPIRIVA) 18 MCG inhalation capsule Place 18 mcg into inhaler and inhale daily.    Historical Provider, MD  tiotropium (SPIRIVA) 18 MCG inhalation capsule Place 18 mcg into inhaler and inhale daily.    Historical Provider, MD  torsemide (DEMADEX) 20 MG tablet Take 2 tablets (40 mg total) by mouth daily. 06/24/13   Janece Canterbury, MD  traMADol (ULTRAM) 50 MG tablet Take 1 tablet (50 mg  total) by mouth daily as needed. 05/23/13 05/23/14  Fayrene Helper, MD  travoprost, benzalkonium, (TRAVATAN Z) 0.004 % ophthalmic solution Place 1 drop into both eyes at bedtime.     Historical Provider, MD  warfarin (COUMADIN) 4 MG tablet Take 4-6 mg by mouth See admin instructions. Patient takes 1 1/2 tablets on Thursdays, Saturdays, Mondays and Wednesdays. Patient takes 1 tablet on Fridays, Sundays, Tuesdays 05/15/13   Historical Provider, MD   BP 122/70  Pulse 94  Resp 16  SpO2 100%  Physical Exam  Nursing note and vitals reviewed. Constitutional: He is oriented to person, place, and time. He appears well-developed.  HENT:  Head: Normocephalic.  Blood in both nostrils. Mild amount of blood in oropharynx.  Eyes: Conjunctivae  are normal.  Neck: No tracheal deviation present.  Cardiovascular:  No murmur heard. Musculoskeletal: Normal range of motion.  Neurological: He is oriented to person, place, and time.  Skin: Skin is warm.  Psychiatric: He has a normal mood and affect.    ED Course  Procedures (including critical care time) DIAGNOSTIC STUDIES: Oxygen Saturation is 100% on RA, normal by my interpretation.    COORDINATION OF CARE: 6:14 PM-Discussed treatment plan which includes medication, CBC, BMP, INR with pt at bedside and pt agreed to plan.   8:01 PM Recheck with Pt and he has improved after receiving medecations  Labs Review Labs Reviewed  CBC WITH DIFFERENTIAL - Abnormal; Notable for the following:    WBC 10.9 (*)    RBC 3.39 (*)    Hemoglobin 9.1 (*)    HCT 30.7 (*)    MCHC 29.6 (*)    RDW 18.1 (*)    Neutro Abs 8.2 (*)    Monocytes Absolute 1.1 (*)    All other components within normal limits  BASIC METABOLIC PANEL - Abnormal; Notable for the following:    Potassium 3.3 (*)    Chloride 92 (*)    CO2 36 (*)    Glucose, Bld 166 (*)    BUN 26 (*)    GFR calc non Af Amer 55 (*)    GFR calc Af Amer 64 (*)    All other components within normal limits   PROTIME-INR - Abnormal; Notable for the following:    Prothrombin Time 20.7 (*)    INR 1.84 (*)    All other components within normal limits    Imaging Review No results found.   EKG Interpretation None      MDM   Final diagnoses:  None   Nose bleed stopped The chart was scribed for me under my direct supervision.  I personally performed the history, physical, and medical decision making and all procedures in the evaluation of this patient.Maudry Diego, MD 07/05/13 2033

## 2013-07-05 NOTE — Discharge Instructions (Signed)
Return if problems.  Otherwise follow up with your md this week.

## 2013-07-05 NOTE — ED Notes (Signed)
Bleeding stopped at this time.

## 2013-07-06 ENCOUNTER — Emergency Department (HOSPITAL_COMMUNITY)
Admission: EM | Admit: 2013-07-06 | Discharge: 2013-07-06 | Disposition: A | Discharge status: Home / Self Care | Payer: Medicare Other | Source: Home / Self Care | Attending: Emergency Medicine | Admitting: Emergency Medicine

## 2013-07-06 ENCOUNTER — Encounter (HOSPITAL_COMMUNITY): Payer: Self-pay | Admitting: Emergency Medicine

## 2013-07-06 ENCOUNTER — Telehealth: Payer: Self-pay | Admitting: Family Medicine

## 2013-07-06 DIAGNOSIS — I10 Essential (primary) hypertension: Secondary | ICD-10-CM | POA: Insufficient documentation

## 2013-07-06 DIAGNOSIS — Z8619 Personal history of other infectious and parasitic diseases: Secondary | ICD-10-CM

## 2013-07-06 DIAGNOSIS — R7301 Impaired fasting glucose: Secondary | ICD-10-CM

## 2013-07-06 DIAGNOSIS — I4891 Unspecified atrial fibrillation: Secondary | ICD-10-CM | POA: Insufficient documentation

## 2013-07-06 DIAGNOSIS — R040 Epistaxis: Secondary | ICD-10-CM

## 2013-07-06 DIAGNOSIS — I2789 Other specified pulmonary heart diseases: Secondary | ICD-10-CM

## 2013-07-06 DIAGNOSIS — H409 Unspecified glaucoma: Secondary | ICD-10-CM | POA: Insufficient documentation

## 2013-07-06 DIAGNOSIS — F17200 Nicotine dependence, unspecified, uncomplicated: Secondary | ICD-10-CM

## 2013-07-06 DIAGNOSIS — J449 Chronic obstructive pulmonary disease, unspecified: Secondary | ICD-10-CM

## 2013-07-06 DIAGNOSIS — Z79899 Other long term (current) drug therapy: Secondary | ICD-10-CM

## 2013-07-06 DIAGNOSIS — Z7901 Long term (current) use of anticoagulants: Secondary | ICD-10-CM

## 2013-07-06 DIAGNOSIS — J4489 Other specified chronic obstructive pulmonary disease: Secondary | ICD-10-CM | POA: Insufficient documentation

## 2013-07-06 DIAGNOSIS — IMO0002 Reserved for concepts with insufficient information to code with codable children: Secondary | ICD-10-CM

## 2013-07-06 DIAGNOSIS — I5030 Unspecified diastolic (congestive) heart failure: Secondary | ICD-10-CM

## 2013-07-06 DIAGNOSIS — Z8669 Personal history of other diseases of the nervous system and sense organs: Secondary | ICD-10-CM

## 2013-07-06 DIAGNOSIS — Z862 Personal history of diseases of the blood and blood-forming organs and certain disorders involving the immune mechanism: Secondary | ICD-10-CM

## 2013-07-06 LAB — BASIC METABOLIC PANEL
BUN: 26 mg/dL — ABNORMAL HIGH (ref 6–23)
CO2: 37 mEq/L — ABNORMAL HIGH (ref 19–32)
Calcium: 9.3 mg/dL (ref 8.4–10.5)
Chloride: 96 mEq/L (ref 96–112)
Creatinine, Ser: 1 mg/dL (ref 0.50–1.35)
GFR calc non Af Amer: 73 mL/min — ABNORMAL LOW (ref >90)
GFR, EST AFRICAN AMERICAN: 84 mL/min — AB (ref >90)
Glucose, Bld: 100 mg/dL — ABNORMAL HIGH (ref 70–99)
POTASSIUM: 3.5 meq/L — AB (ref 3.7–5.3)
SODIUM: 142 meq/L (ref 137–147)

## 2013-07-06 LAB — CBC WITH DIFFERENTIAL/PLATELET
BASOS ABS: 0 10*3/uL (ref 0.0–0.1)
Basophils Relative: 0 % (ref 0–1)
Eosinophils Absolute: 0.2 10*3/uL (ref 0.0–0.7)
Eosinophils Relative: 1 % (ref 0–5)
HCT: 29.2 % — ABNORMAL LOW (ref 39.0–52.0)
Hemoglobin: 8.9 g/dL — ABNORMAL LOW (ref 13.0–17.0)
Lymphocytes Relative: 7 % — ABNORMAL LOW (ref 12–46)
Lymphs Abs: 0.8 10*3/uL (ref 0.7–4.0)
MCH: 27.3 pg (ref 26.0–34.0)
MCHC: 30.5 g/dL (ref 30.0–36.0)
MCV: 89.6 fL (ref 78.0–100.0)
Monocytes Absolute: 1.1 10*3/uL — ABNORMAL HIGH (ref 0.1–1.0)
Monocytes Relative: 10 % (ref 3–12)
NEUTROS PCT: 82 % — AB (ref 43–77)
Neutro Abs: 9.1 10*3/uL — ABNORMAL HIGH (ref 1.7–7.7)
PLATELETS: 294 10*3/uL (ref 150–400)
RBC: 3.26 MIL/uL — ABNORMAL LOW (ref 4.22–5.81)
RDW: 18.3 % — ABNORMAL HIGH (ref 11.5–15.5)
WBC: 11.1 10*3/uL — ABNORMAL HIGH (ref 4.0–10.5)

## 2013-07-06 LAB — PROTIME-INR
INR: 1.38 (ref 0.00–1.49)
PROTHROMBIN TIME: 16.6 s — AB (ref 11.6–15.2)

## 2013-07-06 MED ORDER — HYDROCODONE-ACETAMINOPHEN 5-325 MG PO TABS
1.0000 | ORAL_TABLET | Freq: Once | ORAL | Status: AC
  Administered 2013-07-06: 1 via ORAL
  Filled 2013-07-06: qty 1

## 2013-07-06 MED ORDER — ACETAMINOPHEN 325 MG PO TABS
650.0000 mg | ORAL_TABLET | Freq: Once | ORAL | Status: AC
  Administered 2013-07-06: 650 mg via ORAL
  Filled 2013-07-06: qty 2

## 2013-07-06 MED ORDER — SULFAMETHOXAZOLE-TRIMETHOPRIM 800-160 MG PO TABS: 1.0000 | ORAL_TABLET | Freq: Two times a day (BID) | ORAL | Status: DC

## 2013-07-06 MED ORDER — HYDROCODONE-ACETAMINOPHEN 5-325 MG PO TABS: 2.0000 | ORAL_TABLET | ORAL | Status: DC | PRN

## 2013-07-06 MED ORDER — PHYTONADIONE 5 MG PO TABS
5.0000 mg | ORAL_TABLET | Freq: Once | ORAL | Status: AC
  Administered 2013-07-06: 5 mg via ORAL
  Filled 2013-07-06: qty 1

## 2013-07-06 NOTE — Discharge Instructions (Signed)
Do not take the Rapid Rhino (the pack in your nose) out until you see the ENT doctor in four days. Talk with your heart doctor about when to restart your Warfarin.   Nosebleed Nosebleeds can be caused by many conditions including trauma, infections, polyps, foreign bodies, dry mucous membranes or climate, medications and air conditioning. Most nosebleeds occur in the front of the nose. It is because of this location that most nosebleeds can be controlled by pinching the nostrils gently and continuously. Do this for at least 10 to 20 minutes. The reason for this long continuous pressure is that you must hold it long enough for the blood to clot. If during that 10 to 20 minute time period, pressure is released, the process may have to be started again. The nosebleed may stop by itself, quit with pressure, need concentrated heating (cautery) or stop with pressure from packing. HOME CARE INSTRUCTIONS   If your nose was packed, try to maintain the pack inside until your caregiver removes it. If a gauze pack was used and it starts to fall out, gently replace or cut the end off. Do not cut if a balloon catheter was used to pack the nose. Otherwise, do not remove unless instructed.  Avoid blowing your nose for 12 hours after treatment. This could dislodge the pack or clot and start bleeding again.  If the bleeding starts again, sit up and bending forward, gently pinch the front half of your nose continuously for 20 minutes.  If bleeding was caused by dry mucous membranes, cover the inside of your nose every morning with a petroleum or antibiotic ointment. Use your little fingertip as an applicator. Do this as needed during dry weather. This will keep the mucous membranes moist and allow them to heal.  Maintain humidity in your home by using less air conditioning or using a humidifier.  Do not use aspirin or medications which make bleeding more likely. Your caregiver can give you recommendations on  this.  Resume normal activities as able but try to avoid straining, lifting or bending at the waist for several days.  If the nosebleeds become recurrent and the cause is unknown, your caregiver may suggest laboratory tests. SEEK IMMEDIATE MEDICAL CARE IF:   Bleeding recurs and cannot be controlled.  There is unusual bleeding from or bruising on other parts of the body.  You have a fever.  Nosebleeds continue.  There is any worsening of the condition which originally brought you in.  You become lightheaded, feel faint, become sweaty or vomit blood. MAKE SURE YOU:   Understand these instructions.  Will watch your condition.  Will get help right away if you are not doing well or get worse. Document Released: 12/07/2004 Document Revised: 05/22/2011 Document Reviewed: 01/29/2009 [facility] Patient Information 2014 [place], [place].

## 2013-07-06 NOTE — ED Notes (Signed)
Pt currently applying and holding pressure to nose.  nad noted.

## 2013-07-06 NOTE — ED Notes (Addendum)
Pt reports was seen this am for same. Pt reports packing and rhino rocket was placed this am. Pt reports bleeding started back a few minutes prior to arrival. Moderate bleeding noted to right nare. Pt alert and oriented. Pt denies any dizziness. Airway patent.

## 2013-07-06 NOTE — Telephone Encounter (Addendum)
Pls add HBa1c to lab he had in the ED on 4/25 if able dx is IGT, (HBA1C elevated in 03/2013) thanks

## 2013-07-06 NOTE — Assessment & Plan Note (Signed)
Upcoming appt with opthalmologist scheduled

## 2013-07-06 NOTE — ED Notes (Signed)
Pt reported to the ED this morning with nosebleed and was treated with rhino rocket. Pt reports bleeding returned and has been constant through right nostril. Pt reports some blood being "felt down his throat." Pt denies SOB, pain, or dizziness. Pt A&O and in NAD.

## 2013-07-06 NOTE — ED Provider Notes (Signed)
CSN: [id number]     Arrival date & time 07/06/13  0555 History   First MD Initiated Contact with Patient 07/06/13 [phone]     Chief Complaint  Patient presents with  . Epistaxis     (Consider location/radiation/quality/duration/timing/severity/associated sxs/prior Treatment) The history is provided by the patient.   73 year old Theodore [name] comes in because of ongoing problems with. He has been having bleeding from the right nostril for the last several days. He has had 2 prior ED visits for same. He states that he does take warfarin for atrial fibrillation but has been told to stop taking it because of his nosebleeds. He states that he has not taken it for the last 5 days. He denies any trauma.  Past Medical History  Diagnosis Date  . Anemia     Hemoglobin 11.6 in 04/2008 -> resolved   . Tobacco abuse   . Glaucoma   . Allergic rhinitis   . Atrial fibrillation     Coumadin  . Diastolic heart failure     LVEF 68-03%, rate 2 diastolic dysfunction 04/1222  . Pulmonary hypertension     70 mmHg 03/2012  . Shingles   . Essential hypertension, benign   . COPD (chronic obstructive pulmonary disease)   . Asthma    Past Surgical History  Procedure Laterality Date  . Bilateral cataract surgery    . Herniorrhapy    . Tendon repair      Right hand surgical procedure for a tendon repair  . Colonoscopy N/A 11/29/2012    Procedure: COLONOSCOPY;  Surgeon: Danie Binder, MD;  Location[facility];  Service: Endoscopy;  Laterality: N/A;  1:00  . Esophagogastroduodenoscopy N/A 11/29/2012    Procedure: ESOPHAGOGASTRODUODENOSCOPY (EGD);  Surgeon: Danie Binder, MD;  Location: [facility];  Service: Endoscopy;  Laterality: N/A;  . Eye surgery    . Hernia repair     Family History  Problem Relation Age of Onset  . Cancer Mother   . Cancer Father   . Coronary artery disease Brother   . Colon cancer Brother    History  Substance Use Topics  . Smoking status: Current Some Day Smoker -- 0.20  packs/day for 55 years    Types: Cigarettes    Last Attempt to Quit: 02/13/2012  . Smokeless tobacco: Never Used     Comment: occasionally smokes  . Alcohol Use: No    Review of Systems  All other systems reviewed and are negative.     Allergies  Aspirin  Home Medications   Prior to Admission medications   Medication Sig Start Date End Date Taking? Authorizing Provider  albuterol (PROAIR HFA) 108 (90 BASE) MCG/ACT inhaler Inhale 2 puffs into the lungs every 6 (six) hours as needed for wheezing or shortness of breath. 05/23/13   Fayrene Helper, MD  albuterol (PROVENTIL) (2.5 MG/3ML) 0.083% nebulizer solution Take 2.5 mg by nebulization 2 (two) times daily.    Historical Provider, MD  allopurinol (ZYLOPRIM) 300 MG tablet Take 1 tablet (300 mg total) by mouth daily. 05/23/13   Fayrene Helper, MD  ALPRAZolam Duanne Moron) 0.25 MG tablet Take 0.25 mg by mouth 2 (two) times daily as needed for anxiety.    Historical Provider, MD  brimonidine-timolol (COMBIGAN) 0.2-0.5 % ophthalmic solution Place 1 drop into both eyes every 12 (twelve) hours. 06/23/13   Janece Canterbury, MD  budesonide-formoterol [facility]) 160-4.5 MCG/ACT inhaler Inhale 2 puffs into the lungs 2 (two) times daily. 05/23/13   Norwood Levo  Moshe Cipro, MD  diltiazem (CARDIZEM CD) 180 MG 24 hr capsule Take 1 capsule (180 mg total) by mouth daily. 06/23/13   Janece Canterbury, MD  ferrous sulfate 325 (65 FE) MG tablet Take 1 tablet (325 mg total) by mouth 2 (two) times daily with a meal. 07/05/13 07/05/14  Janece Canterbury, MD  furosemide (LASIX) 40 MG tablet Take 80 mg by mouth daily. 06/09/13   Historical Provider, MD  hydrALAZINE (APRESOLINE) 25 MG tablet Take 1 tablet (25 mg total) by mouth 3 (three) times daily. 07/01/13   Herminio Commons, MD  metoprolol (LOPRESSOR) 50 MG tablet Take 1 tablet (50 mg total) by mouth 2 (two) times daily. 07/01/13   Fayrene Helper, MD  mirtazapine (REMERON) 7.5 MG tablet Take 1 tablet (7.5 mg total) by  mouth at bedtime. 05/21/13   Fayrene Helper, MD  montelukast (SINGULAIR) 10 MG tablet Take 10 mg by mouth at bedtime.    Historical Provider, MD  potassium chloride (K-DUR) 10 MEQ tablet Take 4 tablets (40 mEq total) by mouth 2 (two) times daily. 06/13/13   Tanna Furry, MD  temazepam (RESTORIL) 15 MG capsule Take 15 mg by mouth at bedtime. 06/25/13   Historical Provider, MD  tiotropium (SPIRIVA) 18 MCG inhalation capsule Place 18 mcg into inhaler and inhale daily.    Historical Provider, MD  torsemide (DEMADEX) 20 MG tablet Take 2 tablets (40 mg total) by mouth daily. 06/24/13   Janece Canterbury, MD  traMADol (ULTRAM) 50 MG tablet Take 1 tablet (50 mg total) by mouth daily as needed. 05/23/13 05/23/14  Fayrene Helper, MD  travoprost, benzalkonium, (TRAVATAN Z) 0.004 % ophthalmic solution Place 1 drop into both eyes at bedtime.     Historical Provider, MD  warfarin (COUMADIN) 4 MG tablet Take 4-6 mg by mouth See admin instructions. Patient takes 1 1/2 tablets on Thursdays, Saturdays, Mondays and Wednesdays. Patient takes 1 tablet on Fridays, Sundays, Tuesdays 05/15/13   Historical Provider, MD   BP 149/91  Pulse 90  Temp(Src) 97.5 F (36.4 C) (Oral)  Resp 20  Ht 5\' 6"  (1.676 m)  Wt 145 lb (65.772 kg)  BMI 23.41 kg/m2  SpO2 98% Physical Exam  Nursing note and vitals reviewed.  73 year old Theodore [name], resting comfortably and in no acute distress. Vital signs are significant for hypertension with blood pressure 149/91. Oxygen saturation is 98%, which is normal. Head is normocephalic and atraumatic. PERRLA, EOMI. Oropharynx is clear. Blood is present in the right nostril with bleeding site that seems to be in the nasal septum on the right, fairly high up. Neck is nontender and supple without adenopathy or JVD. Back is nontender and there is no CVA tenderness. Lungs are clear without rales, wheezes, or rhonchi. Chest is nontender. Heart has regular rate and rhythm without murmur. Abdomen is soft, flat,  nontender without masses or hepatosplenomegaly and peristalsis is normoactive. Extremities have no cyanosis or edema, full range of motion is present. Skin is warm and dry without rash. Neurologic: Mental status is normal, cranial nerves are intact, there are no motor or sensory deficits.  ED Course  EPISTAXIS MANAGEMENT Date/Time: 07/06/2013 6:30 AM Performed by: Delora Fuel Authorized by: Roxanne Mins, Kenyetta Wimbish Consent: Verbal consent obtained. written consent not obtained. Risks and benefits: risks, benefits and alternatives were discussed Consent given by: patient Patient understanding: patient states understanding of the procedure being performed Patient consent: the patient's understanding of the procedure matches consent given Procedure consent: procedure consent matches procedure scheduled Relevant  documents: relevant documents present and verified Site marked: the operative site was marked Required items: required blood products, implants, devices, and special equipment available Patient identity confirmed: verbally with patient and arm band Anesthesia method: none. Patient sedated: no Treatment site: right anterior Repair method: Rapid Rhino 7.5 cm. Post-procedure assessment: bleeding stopped Treatment complexity: simple Patient tolerance: Patient tolerated the procedure well with no immediate complications.   (including critical care time)  MDM   Final diagnoses:  Anterior epistaxis    Recurrent epistaxis. Old records are reviewed and he was seen here yesterday and the day before or epistaxis. Again noted on review of his labs that INR was 6.0 3 days ago so I actually suspect that was the day that he has told to stop taking his warfarin. Because her nosebleed is not responding to conservative management, it is elected to treat it with a Rapid Rhino. He is also given a dose of oral vitamin K since I would not expect leading to be able to be controlled although he is still  anticoagulated. INR had some of his ED visit last night was 1.84. He was observed in the ED for 1 hour following placement of Rapid Rhino with no recurrence of bleeding.    Delora Fuel, MD 58/09/98 [id number]

## 2013-07-06 NOTE — Discharge Instructions (Signed)
Nosebleed  A nosebleed can be caused by many things, including:   Getting hit hard in the nose.   Infections.   Dry nose.   Colds.   Medicines.  Your doctor may do lab testing if you get nosebleeds a lot and the cause is not known.  HOME CARE    If your nose was packed with material, keep it there until your doctor takes it out. Put the pack back in your nose if the pack falls out.   Do not blow your nose for 12 hours after the nosebleed.   Sit up and bend forward if your nose starts bleeding again. Pinch the front half of your nose nonstop for 20 minutes.   Put petroleum jelly inside your nose every morning if you have a dry nose.   Use a humidifier to make the air less dry.   Do not take aspirin.   Try not to strain, lift, or bend at the waist for many days after the nosebleed.  GET HELP RIGHT AWAY IF:    Nosebleeds keep happening and are hard to stop or control.   You have bleeding or bruises that are not normal on other parts of the body.   You have a fever.   The nosebleeds get worse.   You get lightheaded, feel faint, sweaty, or throw up (vomit) blood.  MAKE SURE YOU:    Understand these instructions.   Will watch your condition.   Will get help right away if you are not doing well or get worse.  Document Released: 12/07/2007 Document Revised: 05/22/2011 Document Reviewed: 12/07/2007  [employer] Patient Information 2014 [employer].

## 2013-07-06 NOTE — Assessment & Plan Note (Signed)
Severe, needs oxygen chronically still non compliant, now using at bedtime. Patient counseled for approximately 5 minutes regarding the health risks of ongoing nicotine use, specifically all types of cancer, heart disease, stroke and respiratory failure. The options available for help with cessation ,the behavioral changes to assist the process, and the option to either gradully reduce usage  Or abruptly stop.is also discussed. Pt is also encouraged to set specific goals in number of cigarettes used daily, as well as to set a quit date.

## 2013-07-06 NOTE — ED Notes (Signed)
3rd visit here in 2 days for intermittant nose bleed. Left here last night with it under control, began bleeding again a few hours after discharge. States he hasn't slept all night with intermittant right nares bleeding

## 2013-07-06 NOTE — Assessment & Plan Note (Signed)
Chronic, ventricular response is adequately controlled however at this time

## 2013-07-06 NOTE — Progress Notes (Signed)
Subjective:    Patient ID: Theodore [name], male    DOB: 1940/09/20, 73 y.o.   MRN: [id number]  HPI The PT is here for follow up and re-evaluation of chronic medical conditions, medication management and review of any available recent lab and radiology data.  Preventive health is updated, specifically  Cancer screening and Immunization.   Was hospitalized with acute decompensated heart failure since last visit, now using oxygen , at night only, though really needs it at all times. Improved as afar as mental health ans appetite are concerned, though still clearly grieving recent loss of spouse The PT denies any adverse reactions to current medications since the last visit.  There are no new concerns. He will see opthalmology next week for glaucoma management Remains on anticoagulant monitored through coumadin clinic, no report of frank bleeding including epistaxis, for which he has required hospitalization in past 6 month       Review of Systems See HPI Denies recent fever or chills. Denies sinus pressure, nasal congestion, ear pain or sore throat. Denies chest congestion, or  productive cough has chronic shortness of breath wit minimal activity and wheezing. Denies chest pains, does have  Palpitations, denies pND and leg swelling Denies abdominal pain, nausea, vomiting,diarrhea or constipation.   Denies dysuria, frequency, hesitancy or incontinence. Denies joint pain, swelling and limitation in mobility. Denies headaches, seizures, numbness, or tingling. Denies uncontrolled  depression, anxiety or insomnia. Denies skin break down or rash.        Objective:   Physical Exam BP 122/58  Pulse 100  Resp 20  Ht 5' 7.5" (1.715 m)  Wt 146 lb (66.225 kg)  BMI 22.52 kg/m2  SpO2 80% Patient alert and oriented and in no cardiopulmonary distress.  HEENT: No facial asymmetry, EOMI, no sinus tenderness,  oropharynx pink and moist.  Neck supple no adenopathy.  Chest: .Markedly  decreased air entry throughout, no crackles , expiratory wheezes present   CVS: S1, S2 no murmurs, no S3.Irregularly irregular HR  ABD: Soft non tender. Bowel sounds normal.  Ext: No edema  MS: Adequate ROM spine, shoulders, hips and knees.  Skin: Intact, no ulcerations or rash noted.  Psych: Good eye contact, normal affect. Memory impaired not anxious or depressed appearing.  CNS: CN 2-12 intact, power, tone and sensation normal throughout.        Assessment & Plan:  CHF (congestive heart failure) Currently stable. Discussed the need to restrict sodium and fluid intake and weight daily. Med compliance also stressed  HYPERTENSION Controlled, no change in medication   Atrial fibrillation Chronic, ventricular response is adequately controlled however at this time  COPD (chronic obstructive pulmonary disease) Severe, needs oxygen chronically still non compliant, now using at bedtime. Patient counseled for approximately 5 minutes regarding the health risks of ongoing nicotine use, specifically all types of cancer, heart disease, stroke and respiratory failure. The options available for help with cessation ,the behavioral changes to assist the process, and the option to either gradully reduce usage  Or abruptly stop.is also discussed. Pt is also encouraged to set specific goals in number of cigarettes used daily, as well as to set a quit date.   Glaucoma Upcoming appt with opthalmologist scheduled  Depression with anxiety Improved, following sudden unexpected loss of his spouse,he is to continue current medication  Tobacco abuse disorder Continues to smoke, unwilling to set a quit date though states he is cutting back and knows he needs to quit  Patient counseled for approximately  5 minutes regarding the health risks of ongoing nicotine use, specifically all types of cancer, heart disease, stroke and respiratory failure. The options available for help with cessation ,the  behavioral changes to assist the process, and the option to either gradully reduce usage  Or abruptly stop.is also discussed. Pt is also encouraged to set specific goals in number of cigarettes used daily, as well as to set a quit date.

## 2013-07-06 NOTE — ED Notes (Signed)
Patients oxygen level reading low, Dr Eulis Foster aware. EDP came in room states that patient is on home oxygen

## 2013-07-06 NOTE — ED Notes (Signed)
Pt reports nose bleed starting last Monday off and on, and came in this morning and got a  rhino rocket. Pt reports bleeding returned and has been constant for past hour. Pt denies pain. Pt reports that he has not taken warfarin since Monday. Pt A&O and in NAD.

## 2013-07-06 NOTE — ED Notes (Addendum)
Pt sneezed three times causing nose to start bleeding slightly again from right nare. EDP notified.

## 2013-07-06 NOTE — Assessment & Plan Note (Signed)
Continues to smoke, unwilling to set a quit date though states he is cutting back and knows he needs to quit  Patient counseled for approximately 5 minutes regarding the health risks of ongoing nicotine use, specifically all types of cancer, heart disease, stroke and respiratory failure. The options available for help with cessation ,the behavioral changes to assist the process, and the option to either gradully reduce usage  Or abruptly stop.is also discussed. Pt is also encouraged to set specific goals in number of cigarettes used daily, as well as to set a quit date.

## 2013-07-06 NOTE — ED Notes (Signed)
No bleeding noted from nose at present time,

## 2013-07-06 NOTE — Assessment & Plan Note (Signed)
Controlled, no change in medication  

## 2013-07-06 NOTE — Assessment & Plan Note (Signed)
Currently stable. Discussed the need to restrict sodium and fluid intake and weight daily. Med compliance also stressed

## 2013-07-06 NOTE — Assessment & Plan Note (Signed)
Improved, following sudden unexpected loss of his spouse,he is to continue current medication

## 2013-07-06 NOTE — ED Provider Notes (Signed)
CSN: [id number]     Arrival date & time 07/06/13  1747 History   First MD Initiated Contact with Patient 07/06/13 1805     Chief Complaint  Patient presents with  . Epistaxis     (Consider location/radiation/quality/duration/timing/severity/associated sxs/prior Treatment) Patient is a 73 y.o. male presenting with nosebleeds. The history is provided by the patient.  Epistaxis  He is here for recurrent bleeding. He was seen again by me earlier today for same. At that time, I inflated the nasal balloon, by another 9 cc. At that time, this controlled the bleeding. He began bleeding again shortly before arrival, this visit. He denies weakness, dizziness, nausea, vomiting, chest pain or shortness of breath. He has been able to eat and drink. There are no other known modifying factors.  Past Medical History  Diagnosis Date  . Anemia     Hemoglobin 11.6 in 04/2008 -> resolved   . Tobacco abuse   . Glaucoma   . Allergic rhinitis   . Atrial fibrillation     Coumadin  . Diastolic heart failure     LVEF 76-16%, rate 2 diastolic dysfunction 0/7371  . Pulmonary hypertension     70 mmHg 03/2012  . Shingles   . Essential hypertension, benign   . COPD (chronic obstructive pulmonary disease)   . Asthma    Past Surgical History  Procedure Laterality Date  . Bilateral cataract surgery    . Herniorrhapy    . Tendon repair      Right hand surgical procedure for a tendon repair  . Colonoscopy N/A 11/29/2012    Procedure: COLONOSCOPY;  Surgeon: Danie Binder, MD;  Location: [facility];  Service: Endoscopy;  Laterality: N/A;  1:00  . Esophagogastroduodenoscopy N/A 11/29/2012    Procedure: ESOPHAGOGASTRODUODENOSCOPY (EGD);  Surgeon: Danie Binder, MD;  Location: [facility];  Service: Endoscopy;  Laterality: N/A;  . Eye surgery    . Hernia repair     Family History  Problem Relation Age of Onset  . Cancer Mother   . Cancer Father   . Coronary artery disease Brother   . Colon cancer  Brother    History  Substance Use Topics  . Smoking status: Current Some Day Smoker -- 0.20 packs/day for 55 years    Types: Cigarettes    Last Attempt to Quit: 02/13/2012  . Smokeless tobacco: Never Used     Comment: occasionally smokes  . Alcohol Use: No    Review of Systems  HENT: Positive for nosebleeds.   All other systems reviewed and are negative.     Allergies  Aspirin  Home Medications   Prior to Admission medications   Medication Sig Start Date End Date Taking? Authorizing Provider  albuterol (PROAIR HFA) 108 (90 BASE) MCG/ACT inhaler Inhale 2 puffs into the lungs every 6 (six) hours as needed for wheezing or shortness of breath. 05/23/13  Yes Fayrene Helper, MD  albuterol (PROVENTIL) (2.5 MG/3ML) 0.083% nebulizer solution Take 2.5 mg by nebulization 2 (two) times daily.   Yes Historical Provider, MD  allopurinol (ZYLOPRIM) 300 MG tablet Take 1 tablet (300 mg total) by mouth daily. 05/23/13  Yes Fayrene Helper, MD  ALPRAZolam Duanne Moron) 0.25 MG tablet Take 0.25 mg by mouth 2 (two) times daily as needed for anxiety.   Yes Historical Provider, MD  brimonidine-timolol (COMBIGAN) 0.2-0.5 % ophthalmic solution Place 1 drop into both eyes every 12 (twelve) hours. 06/23/13  Yes Janece Canterbury, MD  budesonide-formoterol [facility]) 160-4.5 MCG/ACT  inhaler Inhale 2 puffs into the lungs 2 (two) times daily. 05/23/13  Yes Fayrene Helper, MD  diltiazem (CARDIZEM CD) 180 MG 24 hr capsule Take 1 capsule (180 mg total) by mouth daily. 06/23/13  Yes Janece Canterbury, MD  furosemide (LASIX) 40 MG tablet Take 80 mg by mouth daily. 06/09/13  Yes Historical Provider, MD  hydrALAZINE (APRESOLINE) 25 MG tablet Take 1 tablet (25 mg total) by mouth 3 (three) times daily. 07/01/13  Yes Herminio Commons, MD  metoprolol (LOPRESSOR) 50 MG tablet Take 1 tablet (50 mg total) by mouth 2 (two) times daily. 07/01/13  Yes Fayrene Helper, MD  mirtazapine (REMERON) 7.5 MG tablet Take 1 tablet (7.5  mg total) by mouth at bedtime. 05/21/13  Yes Fayrene Helper, MD  montelukast (SINGULAIR) 10 MG tablet Take 10 mg by mouth at bedtime.   Yes Historical Provider, MD  potassium chloride (K-DUR) 10 MEQ tablet Take 4 tablets (40 mEq total) by mouth 2 (two) times daily. 06/13/13  Yes Tanna Furry, MD  temazepam (RESTORIL) 15 MG capsule Take 15 mg by mouth at bedtime. 06/25/13  Yes Historical Provider, MD  tiotropium (SPIRIVA) 18 MCG inhalation capsule Place 18 mcg into inhaler and inhale daily.   Yes Historical Provider, MD  torsemide (DEMADEX) 20 MG tablet Take 2 tablets (40 mg total) by mouth daily. 06/24/13  Yes Janece Canterbury, MD  traMADol (ULTRAM) 50 MG tablet Take 1 tablet (50 mg total) by mouth daily as needed. 05/23/13 05/23/14 Yes Fayrene Helper, MD  travoprost, benzalkonium, (TRAVATAN Z) 0.004 % ophthalmic solution Place 1 drop into both eyes at bedtime.    Yes Historical Provider, MD  warfarin (COUMADIN) 4 MG tablet Take 4-6 mg by mouth See admin instructions. Patient takes 1 1/2 tablets on Thursdays, Saturdays, Mondays and Wednesdays. Patient takes 1 tablet on Fridays, Sundays, Tuesdays 05/15/13  Yes Historical Provider, MD   BP 150/75  Pulse 71  Temp(Src) 97.8 F (36.6 C) (Axillary)  Resp 22  SpO2 99% Physical Exam  Nursing note and vitals reviewed. Constitutional: He is oriented to person, place, and time. He appears well-developed and well-nourished.  HENT:  Head: Normocephalic and atraumatic.  Right Ear: External ear normal.  Left Ear: External ear normal.  Nasal balloon in place in right nares. There is a mild amount of blood extruding from both the left and right nares. There is blood in the posterior pharynx. The airway is intact.  Eyes: Conjunctivae and EOM are normal. Pupils are equal, round, and reactive to light.  Neck: Normal range of motion and phonation normal. Neck supple.  Cardiovascular: Normal rate.   Pulmonary/Chest: Effort normal. He exhibits no bony tenderness.   Abdominal: Soft. There is no tenderness.  Musculoskeletal: Normal range of motion.  Neurological: He is alert and oriented to person, place, and time. No cranial nerve deficit or sensory deficit. He exhibits normal muscle tone. Coordination normal.  Skin: Skin is warm, dry and intact.  Psychiatric: He has a normal mood and affect. His behavior is normal. Judgment and thought content normal.    ED Course  Procedures (including critical care time) Medications - No data to display  Patient Vitals for the past 24 hrs:  BP Temp Temp src Pulse Resp SpO2  07/06/13 1803 150/75 mmHg 97.8 F (36.6 C) Axillary 71 22 99 %   Epistaxis treatment procedure-  20:40- Rapid Rhino balloon removed from right nares, after deflation; as this was done several large clots, came out with  it. Patient then blew his nose, and several more clots were recovered. The nares were then assessed, by using Frazier tip suction to clear blood that was present in the anterior nose. The nasal septum were dry bilaterally. There is no visible blood in the anterior nose bilaterally. Nares were examined with a nasal speculum. Water was instilled with atomizer to moisten the mucosa, each nares. Patient tolerated this well. Patient gargled with water and spit, then drank water without problems.   Post procedure reassessment- 21:30- nares dry   Reevaluation: 21:55- he is beginning to cough blood out again, and had a mild blood leak anteriorly from the right nares.  Nasal packing- 2157- Rapid Rhino, 7.5 cm, placed easily and inflation sequence, begun with 7 cc air. Additional 3cc instilled, 22:10.  Additional 1.5 cc instilled, 22:20, Norco ordered for pain.  10:35 PM-Consult complete with Dr. Warren Danes, [facility] E[facility]. Patient case explained and discussed. He agrees that patient can be seen tomorrow for further evaluation and treatment, in his office. Call ended at 22:45  22:45- findings discussed with patient, treatment plan discussed.  All questions answered   Labs Review Labs Reviewed  CBC WITH DIFFERENTIAL - Abnormal; Notable for the following:    WBC 11.1 (*)    RBC 3.26 (*)    Hemoglobin 8.9 (*)    HCT 29.2 (*)    RDW 18.3 (*)    Neutrophils Relative % 82 (*)    Neutro Abs 9.1 (*)    Lymphocytes Relative 7 (*)    Monocytes Absolute 1.1 (*)    All other components within normal limits  BASIC METABOLIC PANEL - Abnormal; Notable for the following:    Potassium 3.5 (*)    CO2 37 (*)    Glucose, Bld 100 (*)    BUN 26 (*)    GFR calc non Af Amer 73 (*)    GFR calc Af Amer 84 (*)    All other components within normal limits  PROTIME-INR - Abnormal; Notable for the following:    Prothrombin Time 16.6 (*)    All other components within normal limits    Imaging Review No results found.   EKG Interpretation None      MDM   Final diagnoses:  Epistaxis    Recurrent epistaxis, right nares, clear posterior. Hemoglobin dropped last 24 hours. 0.3 g. . There is no apparent anterior source of bleeding on evaluation here tonight. First nasal balloon removed, replacement required for recurrent bleeding. He is hemodynamically stable, with low oxygen, but is asymptomatic for that. He has oxygen to use, at home.   Nursing Notes Reviewed/ Care Coordinated Applicable Imaging Reviewed Interpretation of Laboratory Data incorporated into ED treatment  The patient appears reasonably screened and/or stabilized for discharge and I doubt any other medical condition or other [facility] requiring further screening, evaluation, or treatment in the ED at this time prior to discharge.  Plan: Home Medications- Norco; Home Treatments- rest; return here if the recommended treatment, does not improve the symptoms; Recommended follow up- ENT f/u tomorrow     Richarda Blade, MD 07/07/13 1520

## 2013-07-06 NOTE — Discharge Instructions (Signed)
Call the ENT office, at 9 AM tomorrow morning to get an appointment to be seen tomorrow. Return here, if needed, for problems.    Nosebleed A nosebleed can be caused by many things, including:  Getting hit hard in the nose.  Infections.  Dry nose.  Colds.  Medicines. Your doctor may do lab testing if you get nosebleeds a lot and the cause is not known. HOME CARE   If your nose was packed with material, keep it there until your doctor takes it out. Put the pack back in your nose if the pack falls out.  Do not blow your nose for 12 hours after the nosebleed.  Sit up and bend forward if your nose starts bleeding again. Pinch the front half of your nose nonstop for 20 minutes.  Put petroleum jelly inside your nose every morning if you have a dry nose.  Use a humidifier to make the air less dry.  Do not take aspirin.  Try not to strain, lift, or bend at the waist for many days after the nosebleed. GET HELP RIGHT AWAY IF:   Nosebleeds keep happening and are hard to stop or control.  You have bleeding or bruises that are not normal on other parts of the body.  You have a fever.  The nosebleeds get worse.  You get lightheaded, feel faint, sweaty, or throw up (vomit) blood. MAKE SURE YOU:   Understand these instructions.  Will watch your condition.  Will get help right away if you are not doing well or get worse. Document Released: 12/07/2007 Document Revised: 05/22/2011 Document Reviewed: 12/07/2007 [facility] [place] Patient Information 2014 [place].

## 2013-07-06 NOTE — ED Provider Notes (Signed)
CSN: [id number]     Arrival date & time 07/06/13  1138 History  This chart was scribed for Richarda Blade, MD by Ludger Nutting, ED Scribe. This patient was seen in room APA14/AP[facility]14 and the patient's care was started 1:11 PM.    Chief Complaint  Patient presents with  . Epistaxis      The history is provided by the patient. No language interpreter was used.    HPI Comments: Theodore [name] is a 73 y.o. male with past medical history of anemia who presents to the Emergency Department complaining of a new episode of epistaxis from the right nare that began about 1 hour ago. Patient has been several times in last few days, with his most recent visit this morning. He had a rapid rhino placed to the right nare but states the bleeding resumed about 1 hour ago. He normally takes warfarin but has not taken any in the past 6 days.    Past Medical History  Diagnosis Date  . Anemia     Hemoglobin 11.6 in 04/2008 -> resolved   . Tobacco abuse   . Glaucoma   . Allergic rhinitis   . Atrial fibrillation     Coumadin  . Diastolic heart failure     LVEF 73-41%, rate 2 diastolic dysfunction 11/3788  . Pulmonary hypertension     70 mmHg 03/2012  . Shingles   . Essential hypertension, benign   . COPD (chronic obstructive pulmonary disease)   . Asthma    Past Surgical History  Procedure Laterality Date  . Bilateral cataract surgery    . Herniorrhapy    . Tendon repair      Right hand surgical procedure for a tendon repair  . Colonoscopy N/A 11/29/2012    Procedure: COLONOSCOPY;  Surgeon: Danie Binder, MD;  [facility]: [facility];  Service: Endoscopy;  Laterality: N/A;  1:00  . Esophagogastroduodenoscopy N/A 11/29/2012    Procedure: ESOPHAGOGASTRODUODENOSCOPY (EGD);  Surgeon: Danie Binder, MD;  Location: [facility];  Service: Endoscopy;  Laterality: N/A;  . Eye surgery    . Hernia repair     Family History  Problem Relation Age of Onset  . Cancer Mother   . Cancer Father   . Coronary  artery disease Brother   . Colon cancer Brother    History  Substance Use Topics  . Smoking status: Current Some Day Smoker -- 0.20 packs/day for 55 years    Types: Cigarettes    Last Attempt to Quit: 02/13/2012  . Smokeless tobacco: Never Used     Comment: occasionally smokes  . Alcohol Use: No    Review of Systems  HENT: Positive for nosebleeds. Negative for rhinorrhea.   Neurological: Negative for light-headedness and headaches.      Allergies  Aspirin  Home Medications   Prior to Admission medications   Medication Sig Start Date End Date Taking? Authorizing Provider  albuterol (PROAIR HFA) 108 (90 BASE) MCG/ACT inhaler Inhale 2 puffs into the lungs every 6 (six) hours as needed for wheezing or shortness of breath. 05/23/13  Yes Fayrene Helper, MD  albuterol (PROVENTIL) (2.5 MG/3ML) 0.083% nebulizer solution Take 2.5 mg by nebulization 2 (two) times daily.   Yes Historical Provider, MD  allopurinol (ZYLOPRIM) 300 MG tablet Take 1 tablet (300 mg total) by mouth daily. 05/23/13  Yes Fayrene Helper, MD  ALPRAZolam Duanne Moron) 0.25 MG tablet Take 0.25 mg by mouth 2 (two) times daily as needed for anxiety.  Yes Historical Provider, MD  brimonidine-timolol (COMBIGAN) 0.2-0.5 % ophthalmic solution Place 1 drop into both eyes every 12 (twelve) hours. 06/23/13  Yes Janece Canterbury, MD  budesonide-formoterol [facility]) 160-4.5 MCG/ACT inhaler Inhale 2 puffs into the lungs 2 (two) times daily. 05/23/13  Yes Fayrene Helper, MD  diltiazem (CARDIZEM CD) 180 MG 24 hr capsule Take 1 capsule (180 mg total) by mouth daily. 06/23/13  Yes Janece Canterbury, MD  furosemide (LASIX) 40 MG tablet Take 80 mg by mouth daily. 06/09/13  Yes Historical Provider, MD  hydrALAZINE (APRESOLINE) 25 MG tablet Take 1 tablet (25 mg total) by mouth 3 (three) times daily. 07/01/13  Yes Herminio Commons, MD  metoprolol (LOPRESSOR) 50 MG tablet Take 1 tablet (50 mg total) by mouth 2 (two) times daily. 07/01/13  Yes  Fayrene Helper, MD  mirtazapine (REMERON) 7.5 MG tablet Take 1 tablet (7.5 mg total) by mouth at bedtime. 05/21/13  Yes Fayrene Helper, MD  montelukast (SINGULAIR) 10 MG tablet Take 10 mg by mouth at bedtime.   Yes Historical Provider, MD  potassium chloride (K-DUR) 10 MEQ tablet Take 4 tablets (40 mEq total) by mouth 2 (two) times daily. 06/13/13  Yes Tanna Furry, MD  temazepam (RESTORIL) 15 MG capsule Take 15 mg by mouth at bedtime. 06/25/13  Yes Historical Provider, MD  tiotropium (SPIRIVA) 18 MCG inhalation capsule Place 18 mcg into inhaler and inhale daily.   Yes Historical Provider, MD  torsemide (DEMADEX) 20 MG tablet Take 2 tablets (40 mg total) by mouth daily. 06/24/13  Yes Janece Canterbury, MD  traMADol (ULTRAM) 50 MG tablet Take 1 tablet (50 mg total) by mouth daily as needed. 05/23/13 05/23/14 Yes Fayrene Helper, MD  travoprost, benzalkonium, (TRAVATAN Z) 0.004 % ophthalmic solution Place 1 drop into both eyes at bedtime.    Yes Historical Provider, MD  warfarin (COUMADIN) 4 MG tablet Take 4-6 mg by mouth See admin instructions. Patient takes 1 1/2 tablets on Thursdays, Saturdays, Mondays and Wednesdays. Patient takes 1 tablet on Fridays, Sundays, Tuesdays 05/15/13  Yes Historical Provider, MD   BP 141/87  Pulse 75  Temp(Src) 97.8 F (36.6 C) (Axillary)  Resp 18  Wt 145 lb (65.772 kg)  SpO2 95% Physical Exam  Nursing note and vitals reviewed. Constitutional: He is oriented to person, place, and time. He appears well-developed and well-nourished.  HENT:  Head: Normocephalic and atraumatic.  Nose: Epistaxis is observed.  Bleeding around the Rapid Rhino that is placed in the right nare.   Eyes: EOM are normal.  Neck: Normal range of motion.  Cardiovascular: Normal rate.   Pulmonary/Chest: Effort normal. No respiratory distress.  Abdominal: Soft. He exhibits no distension.  Musculoskeletal: Normal range of motion.  Neurological: He is alert and oriented to person, place, and  time.  Skin: Skin is warm and dry.  Psychiatric: He has a normal mood and affect. Judgment normal.    ED Course  Procedures (including critical care time)  DIAGNOSTIC STUDIES: Oxygen Saturation is 97% on RA, adequate by my interpretation.    COORDINATION OF CARE: Medications  acetaminophen (TYLENOL) tablet 650 mg (650 mg Oral Given 07/06/13 1414)    Patient Vitals for the past 24 hrs:  BP Temp Temp src Pulse Resp SpO2 Weight  07/06/13 1516 141/87 mmHg - - 75 18 95 % -  07/06/13 1200 - 97.8 F (36.6 C) Axillary - - 97 % -  07/06/13 1155 157/79 mmHg - - 77 22 - 145 lb (65.772  kg)    1:11 PM Added 9 cm of air to the Rapid Rhino that was placed this morning. Discussed treatment plan with pt at bedside and pt agreed to plan.  3:00 PM Upon recheck, bleeding has subsided. Patient will be discharged home and advised to take tylenol for pain/discomfort.      MDM   Final diagnoses:  Epistaxis    Epistaxis right nares, controlled with additional insufflation of Rapid Rhino balloon. No hemodynamic instability.   Nursing Notes Reviewed/ Care Coordinated Applicable Imaging Reviewed Interpretation of Laboratory Data incorporated into ED treatment  The patient appears reasonably screened and/or stabilized for discharge and I doubt any other medical condition or other [facility] requiring further screening, evaluation, or treatment in the ED at this time prior to discharge.  Plan: Home Medications- usual; Home Treatments- rest; return here if the recommended treatment, does not improve the symptoms; Recommended follow up- PCP and ENT in 3 days  I personally performed the services described in this documentation, which was scribed in my presence. The recorded information has been reviewed and is accurate.     Richarda Blade, MD 07/06/13 [phone]

## 2013-07-06 NOTE — ED Notes (Signed)
Pt returns to er for further evaluation of nose bleed, pt was seen in er this am for same and has had other visits to er for nose bleeds as well this week, pt states that after he got home he started bleeding again, pt has rhino rocket in place to right nare with bleeding noted around the gauze, pressure applied to nasal area, pt denies any pain,

## 2013-07-07 LAB — HEMOGLOBIN A1C
HEMOGLOBIN A1C: 5.8 % — AB (ref <5.7)
Mean Plasma Glucose: 120 mg/dL — ABNORMAL HIGH (ref <117)

## 2013-07-07 MED FILL — Oxycodone w/ Acetaminophen Tab 5-325 MG: ORAL | Qty: 6 | Status: AC

## 2013-07-07 NOTE — Telephone Encounter (Signed)
Faxed over order to [facility] lab for add on

## 2013-07-07 NOTE — Addendum Note (Signed)
Addended by: Eual Fines on: 07/07/2013 08:14 AM   Modules accepted: Orders

## 2013-07-08 ENCOUNTER — Telehealth: Payer: Self-pay

## 2013-07-08 ENCOUNTER — Encounter (HOSPITAL_COMMUNITY): Payer: Self-pay | Admitting: Emergency Medicine

## 2013-07-08 ENCOUNTER — Emergency Department (HOSPITAL_COMMUNITY): Payer: Medicare Other

## 2013-07-08 ENCOUNTER — Inpatient Hospital Stay (HOSPITAL_COMMUNITY)
Admission: EM | Admit: 2013-07-08 | Discharge: 2013-07-13 | DRG: 982 | Disposition: A | Discharge status: Home / Self Care | Payer: Medicare Other | Attending: Internal Medicine | Admitting: Internal Medicine

## 2013-07-08 DIAGNOSIS — I10 Essential (primary) hypertension: Secondary | ICD-10-CM | POA: Diagnosis present

## 2013-07-08 DIAGNOSIS — D62 Acute posthemorrhagic anemia: Secondary | ICD-10-CM | POA: Diagnosis present

## 2013-07-08 DIAGNOSIS — J4489 Other specified chronic obstructive pulmonary disease: Secondary | ICD-10-CM | POA: Diagnosis present

## 2013-07-08 DIAGNOSIS — I509 Heart failure, unspecified: Secondary | ICD-10-CM | POA: Diagnosis present

## 2013-07-08 DIAGNOSIS — M109 Gout, unspecified: Secondary | ICD-10-CM | POA: Diagnosis present

## 2013-07-08 DIAGNOSIS — H409 Unspecified glaucoma: Secondary | ICD-10-CM | POA: Diagnosis present

## 2013-07-08 DIAGNOSIS — T45515A Adverse effect of anticoagulants, initial encounter: Secondary | ICD-10-CM | POA: Diagnosis present

## 2013-07-08 DIAGNOSIS — I2789 Other specified pulmonary heart diseases: Secondary | ICD-10-CM | POA: Diagnosis present

## 2013-07-08 DIAGNOSIS — Z79899 Other long term (current) drug therapy: Secondary | ICD-10-CM

## 2013-07-08 DIAGNOSIS — D72829 Elevated white blood cell count, unspecified: Secondary | ICD-10-CM | POA: Diagnosis present

## 2013-07-08 DIAGNOSIS — I4891 Unspecified atrial fibrillation: Secondary | ICD-10-CM | POA: Diagnosis present

## 2013-07-08 DIAGNOSIS — I5033 Acute on chronic diastolic (congestive) heart failure: Secondary | ICD-10-CM

## 2013-07-08 DIAGNOSIS — I5032 Chronic diastolic (congestive) heart failure: Secondary | ICD-10-CM | POA: Diagnosis present

## 2013-07-08 DIAGNOSIS — R040 Epistaxis: Principal | ICD-10-CM | POA: Diagnosis present

## 2013-07-08 DIAGNOSIS — K5900 Constipation, unspecified: Secondary | ICD-10-CM | POA: Diagnosis present

## 2013-07-08 DIAGNOSIS — F17200 Nicotine dependence, unspecified, uncomplicated: Secondary | ICD-10-CM | POA: Diagnosis present

## 2013-07-08 DIAGNOSIS — R791 Abnormal coagulation profile: Secondary | ICD-10-CM | POA: Diagnosis present

## 2013-07-08 DIAGNOSIS — J9601 Acute respiratory failure with hypoxia: Secondary | ICD-10-CM

## 2013-07-08 DIAGNOSIS — N179 Acute kidney failure, unspecified: Secondary | ICD-10-CM | POA: Diagnosis present

## 2013-07-08 DIAGNOSIS — I498 Other specified cardiac arrhythmias: Secondary | ICD-10-CM | POA: Diagnosis present

## 2013-07-08 DIAGNOSIS — J449 Chronic obstructive pulmonary disease, unspecified: Secondary | ICD-10-CM

## 2013-07-08 DIAGNOSIS — F411 Generalized anxiety disorder: Secondary | ICD-10-CM | POA: Diagnosis present

## 2013-07-08 LAB — COMPREHENSIVE METABOLIC PANEL
ALBUMIN: 3.4 g/dL — AB (ref 3.5–5.2)
ALT: 14 U/L (ref 0–53)
AST: 28 U/L (ref 0–37)
Alkaline Phosphatase: 98 U/L (ref 39–117)
BUN: 28 mg/dL — AB (ref 6–23)
CO2: 34 mEq/L — ABNORMAL HIGH (ref 19–32)
Calcium: 9.6 mg/dL (ref 8.4–10.5)
Chloride: 96 mEq/L (ref 96–112)
Creatinine, Ser: 1.71 mg/dL — ABNORMAL HIGH (ref 0.50–1.35)
GFR calc Af Amer: 44 mL/min — ABNORMAL LOW (ref >90)
GFR calc non Af Amer: 38 mL/min — ABNORMAL LOW (ref >90)
Glucose, Bld: 86 mg/dL (ref 70–99)
Potassium: 4.3 mEq/L (ref 3.7–5.3)
SODIUM: 142 meq/L (ref 137–147)
TOTAL PROTEIN: 7.5 g/dL (ref 6.0–8.3)
Total Bilirubin: 0.5 mg/dL (ref 0.3–1.2)

## 2013-07-08 LAB — CBC WITH DIFFERENTIAL/PLATELET
BASOS PCT: 0 % (ref 0–1)
Basophils Absolute: 0 10*3/uL (ref 0.0–0.1)
EOS ABS: 0.2 10*3/uL (ref 0.0–0.7)
Eosinophils Relative: 2 % (ref 0–5)
HCT: 27.8 % — ABNORMAL LOW (ref 39.0–52.0)
Hemoglobin: 8.4 g/dL — ABNORMAL LOW (ref 13.0–17.0)
Lymphocytes Relative: 8 % — ABNORMAL LOW (ref 12–46)
Lymphs Abs: 0.9 10*3/uL (ref 0.7–4.0)
MCH: 27 pg (ref 26.0–34.0)
MCHC: 30.2 g/dL (ref 30.0–36.0)
MCV: 89.4 fL (ref 78.0–100.0)
Monocytes Absolute: 1.2 10*3/uL — ABNORMAL HIGH (ref 0.1–1.0)
Monocytes Relative: 10 % (ref 3–12)
NEUTROS PCT: 80 % — AB (ref 43–77)
Neutro Abs: 9.1 10*3/uL — ABNORMAL HIGH (ref 1.7–7.7)
Platelets: 299 10*3/uL (ref 150–400)
RBC: 3.11 MIL/uL — ABNORMAL LOW (ref 4.22–5.81)
RDW: 18.8 % — ABNORMAL HIGH (ref 11.5–15.5)
WBC: 11.4 10*3/uL — ABNORMAL HIGH (ref 4.0–10.5)

## 2013-07-08 LAB — URINALYSIS, ROUTINE W REFLEX MICROSCOPIC
Bilirubin Urine: NEGATIVE
Glucose, UA: NEGATIVE mg/dL
Hgb urine dipstick: NEGATIVE
Ketones, ur: NEGATIVE mg/dL
LEUKOCYTES UA: NEGATIVE
Nitrite: NEGATIVE
PROTEIN: NEGATIVE mg/dL
Specific Gravity, Urine: 1.02 (ref 1.005–1.030)
UROBILINOGEN UA: 0.2 mg/dL (ref 0.0–1.0)
pH: 6.5 (ref 5.0–8.0)

## 2013-07-08 LAB — PROTIME-INR
INR: 1.22 (ref 0.00–1.49)
PROTHROMBIN TIME: 15.1 s (ref 11.6–15.2)

## 2013-07-08 MED ORDER — ONDANSETRON HCL 4 MG/2ML IJ SOLN: 4.0000 mg | Freq: Four times a day (QID) | INTRAMUSCULAR | Status: DC | PRN

## 2013-07-08 MED ORDER — OXYMETAZOLINE HCL 0.05 % NA SOLN
1.0000 | Freq: Two times a day (BID) | NASAL | Status: DC | PRN
  Administered 2013-07-09 (×2): 1 via NASAL
  Filled 2013-07-08: qty 15

## 2013-07-08 MED ORDER — POTASSIUM CHLORIDE ER 10 MEQ PO TBCR: 10.0000 meq | EXTENDED_RELEASE_TABLET | Freq: Two times a day (BID) | ORAL | Status: DC

## 2013-07-08 MED ORDER — MOMETASONE FURO-FORMOTEROL FUM 200-5 MCG/ACT IN AERO
2.0000 | INHALATION_SPRAY | Freq: Two times a day (BID) | RESPIRATORY_TRACT | Status: DC
  Administered 2013-07-08 – 2013-07-13 (×10): 2 via RESPIRATORY_TRACT
  Filled 2013-07-08 (×4): qty 8.8

## 2013-07-08 MED ORDER — MIRTAZAPINE 7.5 MG PO TABS
7.5000 mg | ORAL_TABLET | Freq: Every day | ORAL | Status: DC
  Administered 2013-07-09: 7.5 mg via ORAL
  Filled 2013-07-08 (×3): qty 1

## 2013-07-08 MED ORDER — ALLOPURINOL 300 MG PO TABS
300.0000 mg | ORAL_TABLET | Freq: Every day | ORAL | Status: DC
  Administered 2013-07-09: 300 mg via ORAL
  Filled 2013-07-08 (×2): qty 1

## 2013-07-08 MED ORDER — ONDANSETRON HCL 4 MG PO TABS: 4.0000 mg | ORAL_TABLET | Freq: Four times a day (QID) | ORAL | Status: DC | PRN

## 2013-07-08 MED ORDER — ALBUTEROL SULFATE (2.5 MG/3ML) 0.083% IN NEBU
2.5000 mg | INHALATION_SOLUTION | Freq: Four times a day (QID) | RESPIRATORY_TRACT | Status: DC | PRN
  Administered 2013-07-11: 2.5 mg via RESPIRATORY_TRACT
  Filled 2013-07-08: qty 3

## 2013-07-08 MED ORDER — METOPROLOL TARTRATE 50 MG PO TABS
50.0000 mg | ORAL_TABLET | Freq: Two times a day (BID) | ORAL | Status: DC
  Administered 2013-07-09 – 2013-07-11 (×5): 50 mg via ORAL
  Filled 2013-07-08 (×7): qty 1

## 2013-07-08 MED ORDER — MONTELUKAST SODIUM 10 MG PO TABS
10.0000 mg | ORAL_TABLET | Freq: Every day | ORAL | Status: DC
  Administered 2013-07-09 – 2013-07-12 (×4): 10 mg via ORAL
  Filled 2013-07-08 (×7): qty 1

## 2013-07-08 MED ORDER — ALBUTEROL SULFATE HFA 108 (90 BASE) MCG/ACT IN AERS: 2.0000 | INHALATION_SPRAY | Freq: Four times a day (QID) | RESPIRATORY_TRACT | Status: DC | PRN

## 2013-07-08 MED ORDER — FERROUS SULFATE 325 (65 FE) MG PO TABS
325.0000 mg | ORAL_TABLET | Freq: Two times a day (BID) | ORAL | Status: DC
  Administered 2013-07-08 – 2013-07-13 (×9): 325 mg via ORAL
  Filled 2013-07-08 (×11): qty 1

## 2013-07-08 MED ORDER — TRAMADOL HCL 50 MG PO TABS
50.0000 mg | ORAL_TABLET | Freq: Every day | ORAL | Status: DC | PRN
  Administered 2013-07-08: 50 mg via ORAL
  Filled 2013-07-08: qty 1

## 2013-07-08 MED ORDER — HYDRALAZINE HCL 25 MG PO TABS
25.0000 mg | ORAL_TABLET | Freq: Three times a day (TID) | ORAL | Status: DC
  Administered 2013-07-09: 25 mg via ORAL
  Filled 2013-07-08 (×4): qty 1

## 2013-07-08 MED ORDER — TIMOLOL MALEATE 0.5 % OP SOLN
1.0000 [drp] | Freq: Two times a day (BID) | OPHTHALMIC | Status: DC
  Administered 2013-07-09 – 2013-07-13 (×9): 1 [drp] via OPHTHALMIC
  Filled 2013-07-08 (×2): qty 5

## 2013-07-08 MED ORDER — BRIMONIDINE TARTRATE 0.2 % OP SOLN
1.0000 [drp] | Freq: Two times a day (BID) | OPHTHALMIC | Status: DC
  Administered 2013-07-08 – 2013-07-13 (×10): 1 [drp] via OPHTHALMIC
  Filled 2013-07-08 (×2): qty 5

## 2013-07-08 MED ORDER — SODIUM CHLORIDE 0.9 % IV BOLUS (SEPSIS)
500.0000 mL | Freq: Once | INTRAVENOUS | Status: AC
  Administered 2013-07-08: 500 mL via INTRAVENOUS

## 2013-07-08 MED ORDER — DILTIAZEM HCL ER COATED BEADS 180 MG PO CP24
180.0000 mg | ORAL_CAPSULE | Freq: Every day | ORAL | Status: DC
  Administered 2013-07-09 – 2013-07-13 (×4): 180 mg via ORAL
  Filled 2013-07-08 (×5): qty 1

## 2013-07-08 MED ORDER — SODIUM CHLORIDE 0.9 % IV SOLN: INTRAVENOUS | Status: DC

## 2013-07-08 MED ORDER — ALPRAZOLAM 0.25 MG PO TABS: 0.2500 mg | ORAL_TABLET | Freq: Two times a day (BID) | ORAL | Status: DC | PRN

## 2013-07-08 MED ORDER — ALBUTEROL SULFATE (2.5 MG/3ML) 0.083% IN NEBU
2.5000 mg | INHALATION_SOLUTION | Freq: Two times a day (BID) | RESPIRATORY_TRACT | Status: DC
  Administered 2013-07-08 – 2013-07-13 (×10): 2.5 mg via RESPIRATORY_TRACT
  Filled 2013-07-08 (×10): qty 3

## 2013-07-08 MED ORDER — DILTIAZEM HCL ER COATED BEADS 180 MG PO CP24: 180.0000 mg | ORAL_CAPSULE | Freq: Every day | ORAL | Status: DC

## 2013-07-08 MED ORDER — HYDROCODONE-ACETAMINOPHEN 5-325 MG PO TABS
2.0000 | ORAL_TABLET | ORAL | Status: DC | PRN
  Administered 2013-07-13: 2 via ORAL
  Filled 2013-07-08: qty 2

## 2013-07-08 MED ORDER — TEMAZEPAM 15 MG PO CAPS
15.0000 mg | ORAL_CAPSULE | Freq: Every day | ORAL | Status: DC
  Administered 2013-07-09: 15 mg via ORAL
  Filled 2013-07-08: qty 1

## 2013-07-08 MED ORDER — BRIMONIDINE TARTRATE-TIMOLOL 0.2-0.5 % OP SOLN: 1.0000 [drp] | Freq: Two times a day (BID) | OPHTHALMIC | Status: DC

## 2013-07-08 MED ORDER — TIOTROPIUM BROMIDE MONOHYDRATE 18 MCG IN CAPS
18.0000 ug | ORAL_CAPSULE | Freq: Every day | RESPIRATORY_TRACT | Status: DC
  Administered 2013-07-09 – 2013-07-13 (×5): 18 ug via RESPIRATORY_TRACT
  Filled 2013-07-08 (×2): qty 5

## 2013-07-08 MED ORDER — TRAVOPROST 0.004 % OP SOLN: 1.0000 [drp] | Freq: Every day | OPHTHALMIC | Status: DC

## 2013-07-08 MED ORDER — BUDESONIDE-FORMOTEROL FUMARATE 160-4.5 MCG/ACT IN AERO: 2.0000 | INHALATION_SPRAY | Freq: Two times a day (BID) | RESPIRATORY_TRACT | Status: DC

## 2013-07-08 MED ORDER — LATANOPROST 0.005 % OP SOLN
1.0000 [drp] | Freq: Every day | OPHTHALMIC | Status: DC
  Administered 2013-07-08 – 2013-07-13 (×5): 1 [drp] via OPHTHALMIC
  Filled 2013-07-08 (×2): qty 2.5

## 2013-07-08 NOTE — Telephone Encounter (Signed)
Received call from [facility] who saw pt this am,has BP 92 sys ( 150/75 on 4/26 in ED)  HR 70-90  Has right nare nasal rhino rocket in place,has large hematoma in left nare that is dripping blood.Had INR elevated earlier this week 07/03/13 of 6.1    INR  1.38 on 4/26  She is planning to take patient to the ED for evaluation   Will route to Dr.Koneswaran

## 2013-07-08 NOTE — ED Notes (Signed)
Report given to Jessica, RN.

## 2013-07-08 NOTE — H&P (Addendum)
[facility] History and Physical  Theodore [name] ACZ:[id number] DOB: January 02, 1941 DOA: 07/08/2013  Referring physician: ER. PCP: Theodore Nakayama, MD   Chief Complaint: Recurrent epistaxis.  HPI: Theodore [name] is a 73 y.o. male  This is a 73 year old man who has a history of diastolic congestive heart failure and also atrial fibrillation for which he is on anticoagulation with warfarin now presents with approximately one week history of recurrent episodes of epistaxis. He has been to the emergency room several times in the last week and has had nasal packing on the right nostril. At one point a couple of days ago his INR was 6.0 and warfarin was discontinued and he was given one dose of oral vitamin K. He was observed in the ER for an hour following placement of a rapid Rhino with no recurrence of bleeding and then discharged home. He returns once again with bleeding. On this occasion he is found to have a reduced hemoglobin and creatinine has increased. He is now being admitted with a view to consultation by ENT at [facility].   Review of Systems:  Constitutional:  No weight loss, night sweats, Fevers, chills, fatigue.   Cardio-vascular:  No chest pain, Orthopnea, PND, swelling in lower extremities, anasarca, dizziness, palpitations  GI:  No heartburn, indigestion, abdominal pain, nausea, vomiting, diarrhea, change in bowel habits, loss of appetite  Resp:  No shortness of breath with exertion or at rest. No excess mucus, no productive cough, No non-productive cough, No coughing up of blood.No change in color of mucus.No wheezing.No chest wall deformity  Skin:  no rash or lesions.  GU:  no dysuria, change in color of urine, no urgency or frequency. No flank pain.  Musculoskeletal:  No joint pain or swelling. No decreased range of motion. No back pain.  Psych:  No change in mood or affect. No depression or anxiety. No memory loss.   Past Medical History  Diagnosis  Date  . Anemia     Hemoglobin 11.6 in 04/2008 -> resolved   . Tobacco abuse   . Glaucoma   . Allergic rhinitis   . Atrial fibrillation     Coumadin  . Diastolic heart failure     LVEF 10-93%, rate 2 diastolic dysfunction 04/3555  . Pulmonary hypertension     70 mmHg 03/2012  . Shingles   . Essential hypertension, benign   . COPD (chronic obstructive pulmonary disease)   . Asthma    Past Surgical History  Procedure Laterality Date  . Bilateral cataract surgery    . Herniorrhapy    . Tendon repair      Right hand surgical procedure for a tendon repair  . Colonoscopy N/A 11/29/2012    Procedure: COLONOSCOPY;  Surgeon: Theodore Binder, MD;  Location[facility];  Service: Endoscopy;  Laterality: N/A;  1:00  . Esophagogastroduodenoscopy N/A 11/29/2012    Procedure: ESOPHAGOGASTRODUODENOSCOPY (EGD);  Surgeon: Theodore Binder, MD;  Location: [facility];  Service: Endoscopy;  Laterality: N/A;  . Eye surgery    . Hernia repair     Social History:  reports that he has been smoking Cigarettes.  He has a 11 pack-year smoking history. He has never used smokeless tobacco. He reports that he does not drink alcohol or use illicit drugs.  Allergies  Allergen Reactions  . Aspirin Other (See Comments)    On coumadin    Family History  Problem Relation Age of Onset  . Cancer Mother   .  Cancer Father   . Coronary artery disease Brother   . Colon cancer Brother      Prior to Admission medications   Medication Sig Start Date End Date Taking? Authorizing Provider  albuterol (PROVENTIL) (2.5 MG/3ML) 0.083% nebulizer solution Take 2.5 mg by nebulization 2 (two) times daily.   Yes Historical Provider, MD  allopurinol (ZYLOPRIM) 300 MG tablet Take 1 tablet (300 mg total) by mouth daily. 05/23/13  Yes Theodore Helper, MD  ALPRAZolam Duanne Moron) 0.25 MG tablet Take 0.25 mg by mouth 2 (two) times daily as needed for anxiety.   Yes Historical Provider, MD  brimonidine-timolol (COMBIGAN) 0.2-0.5 %  ophthalmic solution Place 1 drop into both eyes every 12 (twelve) hours. 06/23/13  Yes Theodore Canterbury, MD  budesonide-formoterol [facility]) 160-4.5 MCG/ACT inhaler Inhale 2 puffs into the lungs 2 (two) times daily. 05/23/13  Yes Theodore Helper, MD  diltiazem (CARDIZEM CD) 180 MG 24 hr capsule Take 180 mg by mouth daily. *Patient states that as of 07/08/2013 he was advised to STOP this medication 06/23/13  Yes Theodore Canterbury, MD  ferrous sulfate 325 (65 FE) MG tablet Take 325 mg by mouth 2 (two) times daily.   Yes Historical Provider, MD  hydrALAZINE (APRESOLINE) 25 MG tablet Take 1 tablet (25 mg total) by mouth 3 (three) times daily. 07/01/13  Yes Theodore Commons, MD  HYDROcodone-acetaminophen (NORCO/VICODIN) 5-325 MG per tablet Take 2 tablets by mouth every 4 (four) hours as needed. 07/06/13  Yes Theodore Blade, MD  metoprolol (LOPRESSOR) 50 MG tablet Take 1 tablet (50 mg total) by mouth 2 (two) times daily. 07/01/13  Yes Theodore Helper, MD  montelukast (SINGULAIR) 10 MG tablet Take 10 mg by mouth at bedtime.   Yes Historical Provider, MD  potassium chloride (K-DUR) 10 MEQ tablet Take 10 mEq by mouth 2 (two) times daily. 06/13/13  Yes Tanna Furry, MD  sulfamethoxazole-trimethoprim (BACTRIM DS) 800-160 MG per tablet Take 1 tablet by mouth 2 (two) times daily. 5 day course starting on 07/06/2013   Yes Historical Provider, MD  temazepam (RESTORIL) 15 MG capsule Take 15 mg by mouth at bedtime. 06/25/13  Yes Historical Provider, MD  tiotropium (SPIRIVA) 18 MCG inhalation capsule Place 18 mcg into inhaler and inhale daily.   Yes Historical Provider, MD  torsemide (DEMADEX) 20 MG tablet Take 2 tablets (40 mg total) by mouth daily. 06/24/13  Yes Theodore Canterbury, MD  travoprost, benzalkonium, (TRAVATAN Z) 0.004 % ophthalmic solution Place 1 drop into both eyes at bedtime.    Yes Historical Provider, MD  albuterol (PROAIR HFA) 108 (90 BASE) MCG/ACT inhaler Inhale 2 puffs into the lungs every 6 (six) hours as  needed for wheezing or shortness of breath. 05/23/13   Theodore Helper, MD  furosemide (LASIX) 40 MG tablet Take 80 mg by mouth daily. 06/09/13   Historical Provider, MD  mirtazapine (REMERON) 7.5 MG tablet Take 1 tablet (7.5 mg total) by mouth at bedtime. 05/21/13   Theodore Helper, MD  traMADol (ULTRAM) 50 MG tablet Take 1 tablet (50 mg total) by mouth daily as needed. 05/23/13 05/23/14  Theodore Helper, MD  warfarin (COUMADIN) 4 MG tablet Take 4-6 mg by mouth See admin instructions. Patient takes 1 1/2 tablets on Thursdays, Saturdays, Mondays and Wednesdays. Patient takes 1 tablet on Fridays, Sundays, Tuesdays 05/15/13   Historical Provider, MD   Physical Exam: Filed Vitals:   07/08/13 1700  BP: 121/55  Pulse: 68  Temp:   Resp:  BP 121/55  Pulse 68  Temp(Src) 98.1 F (36.7 C) (Oral)  Resp 20  Ht 5\' 6"  (1.676 m)  Wt 65.318 kg (144 lb)  BMI 23.25 kg/m2  SpO2 74%  General:  Appears calm and comfortable Eyes: PERRL, normal lids, irises & conjunctiva ENT: Right nostril packed, no active bleeding. Neck: no LAD, masses or thyromegaly Cardiovascular: RRR, no m/r/g. No LE edema. Telemetry: SR, no arrhythmias  Respiratory: CTA bilaterally, no w/r/r. Normal respiratory effort. Abdomen: soft, ntnd Skin: no rash or induration seen on limited exam Musculoskeletal: grossly normal tone BUE/BLE Psychiatric: grossly normal mood and affect, speech fluent and appropriate Neurologic: grossly non-focal.          Labs on Admission:  Basic Metabolic Panel:  Recent Labs Lab 07/04/13 1955 07/05/13 1828 07/06/13 1901 07/08/13 1334  NA 142 141 142 142  K 3.6* 3.3* 3.5* 4.3  CL 92* 92* 96 96  CO2 40* 36* 37* 34*  GLUCOSE 100* 166* 100* 86  BUN 24* 26* 26* 28*  CREATININE 1.12 1.25 1.00 1.71*  CALCIUM 9.7 9.3 9.3 9.6   Liver Function Tests:  Recent Labs Lab 07/08/13 1334  AST 28  ALT 14  ALKPHOS 98  BILITOT 0.5  PROT 7.5  ALBUMIN 3.4*     CBC:  Recent Labs Lab  07/04/13 1955 07/05/13 1828 07/06/13 1901 07/08/13 1334  WBC 11.5* 10.9* 11.1* 11.4*  NEUTROABS 8.7* 8.2* 9.1* 9.1*  HGB 10.3* 9.1* 8.9* 8.4*  HCT 34.7* 30.7* 29.2* 27.8*  MCV 91.6 90.6 89.6 89.4  PLT 345 301 294 299   :   BNP (last 3 results)  Recent Labs  06/13/13 1113 06/16/13 1554 06/17/13 0537  PROBNP 5155.0* 3560.0* 3222.0*      Radiological Exams on Admission: Dg Chest 2 View  07/08/2013   CLINICAL DATA:  Shortness of breath, epistaxis  EXAM: CHEST  2 VIEW  COMPARISON:  06/22/2013  FINDINGS: Lungs are essentially clear. No focal consolidation. No pleural effusion or pneumothorax.  Cardiomegaly.  Visualized osseous structures are within normal limits.  IMPRESSION: No evidence of acute cardiopulmonary disease.   Electronically Signed   By: Julian Hy M.D.   On: 07/08/2013 13:31      Assessment/Plan   1. Acute blood loss anemia secondary to epistaxis. 2. Recurrent epistaxis. 3. Acute renal failure, likely secondary to poor by mouth intake and dehydration. 4. Chronic atrial fibrillation previously on Coumadin, this has been discontinued and his INR today is 1.22. 5. Hypertension. 6. History of diastolic congestive heart failure, currently compensated.  Plan: 1. Admit to telemetry floor at [facility]. 2. Gentle IV fluids and discontinue oral diuretics for the time being. Watch for development of congestive heart failure which point diuretics will need to be reinstituted. 3. Monitor hemoglobin closely, he may need blood transfusion. 4. ENT consultation. 5. Reinstitution anticoagulation with Coumadin after ENT clears.  Other recommendations will depend on patient's hospital progress.  Code Status: Full code.   Family Communication: I discussed the plan with patient at the bedside.   Disposition Plan: Home when medically stable.   Time spent: 45 minutes.  [facility] Pager [phone].

## 2013-07-08 NOTE — ED Notes (Addendum)
Pt here 4/26 for nosebleed.  Brought to ER today by Case Manager. Due to low bp, and cont nose bleed. Coumadin on hold.    Has  Rhino rocket in rt nostril

## 2013-07-08 NOTE — ED Provider Notes (Signed)
CSN: [id number]     Arrival date & time 07/08/13  1213 History   First MD Initiated Contact with Patient 07/08/13 1459     Chief Complaint  Patient presents with  . Hypotension    HPI Patient presents to the emergency room for reevaluation of a nosebleed.  Patient had been seen in the emergency department on April 26 and had nasal packing placed. The patient also was on Coumadin and had that medication he'll He was referred to ENT. The patient has not seen an ENT doctor yet.  Patient states the bleeding has decreased significantly but he continues to have some oozing from his nares. Has not had any trouble with lightheadedness. He denies any chest pain or shortness of breath.  Home health nurse evaluated the patient and noted a low blood pressure.  By report, the blood pressure was in the 78G systolic. He was sent into the emergency room for further evaluation. Past Medical History  Diagnosis Date  . Anemia     Hemoglobin 11.6 in 04/2008 -> resolved   . Tobacco abuse   . Glaucoma   . Allergic rhinitis   . Atrial fibrillation     Coumadin  . Diastolic heart failure     LVEF 95-62%, rate 2 diastolic dysfunction 03/3084  . Pulmonary hypertension     70 mmHg 03/2012  . Shingles   . Essential hypertension, benign   . COPD (chronic obstructive pulmonary disease)   . Asthma    Past Surgical History  Procedure Laterality Date  . Bilateral cataract surgery    . Herniorrhapy    . Tendon repair      Right hand surgical procedure for a tendon repair  . Colonoscopy N/A 11/29/2012    Procedure: COLONOSCOPY;  Surgeon: Danie Binder, MD;  Location: [facility];  Service: Endoscopy;  Laterality: N/A;  1:00  . Esophagogastroduodenoscopy N/A 11/29/2012    Procedure: ESOPHAGOGASTRODUODENOSCOPY (EGD);  Surgeon: Danie Binder, MD;  Location: [facility];  Service: Endoscopy;  Laterality: N/A;  . Eye surgery    . Hernia repair     Family History  Problem Relation Age of Onset  . Cancer Mother    . Cancer Father   . Coronary artery disease Brother   . Colon cancer Brother    History  Substance Use Topics  . Smoking status: Current Some Day Smoker -- 0.20 packs/day for 55 years    Types: Cigarettes    Last Attempt to Quit: 02/13/2012  . Smokeless tobacco: Never Used     Comment: occasionally smokes  . Alcohol Use: No    Review of Systems  All other systems reviewed and are negative.     Allergies  Aspirin  Home Medications   Prior to Admission medications   Medication Sig Start Date End Date Taking? Authorizing Provider  albuterol (PROAIR HFA) 108 (90 BASE) MCG/ACT inhaler Inhale 2 puffs into the lungs every 6 (six) hours as needed for wheezing or shortness of breath. 05/23/13   Fayrene Helper, MD  albuterol (PROVENTIL) (2.5 MG/3ML) 0.083% nebulizer solution Take 2.5 mg by nebulization 2 (two) times daily.    Historical Provider, MD  allopurinol (ZYLOPRIM) 300 MG tablet Take 1 tablet (300 mg total) by mouth daily. 05/23/13   Fayrene Helper, MD  ALPRAZolam Duanne Moron) 0.25 MG tablet Take 0.25 mg by mouth 2 (two) times daily as needed for anxiety.    Historical Provider, MD  brimonidine-timolol (COMBIGAN) 0.2-0.5 % ophthalmic solution Place  1 drop into both eyes every 12 (twelve) hours. 06/23/13   Janece Canterbury, MD  budesonide-formoterol [facility]) 160-4.5 MCG/ACT inhaler Inhale 2 puffs into the lungs 2 (two) times daily. 05/23/13   Fayrene Helper, MD  diltiazem (CARDIZEM CD) 180 MG 24 hr capsule Take 1 capsule (180 mg total) by mouth daily. 06/23/13   Janece Canterbury, MD  furosemide (LASIX) 40 MG tablet Take 80 mg by mouth daily. 06/09/13   Historical Provider, MD  hydrALAZINE (APRESOLINE) 25 MG tablet Take 1 tablet (25 mg total) by mouth 3 (three) times daily. 07/01/13   Herminio Commons, MD  HYDROcodone-acetaminophen (NORCO/VICODIN) 5-325 MG per tablet Take 2 tablets by mouth every 4 (four) hours as needed. 07/06/13   Richarda Blade, MD  metoprolol (LOPRESSOR) 50  MG tablet Take 1 tablet (50 mg total) by mouth 2 (two) times daily. 07/01/13   Fayrene Helper, MD  mirtazapine (REMERON) 7.5 MG tablet Take 1 tablet (7.5 mg total) by mouth at bedtime. 05/21/13   Fayrene Helper, MD  montelukast (SINGULAIR) 10 MG tablet Take 10 mg by mouth at bedtime.    Historical Provider, MD  potassium chloride (K-DUR) 10 MEQ tablet Take 4 tablets (40 mEq total) by mouth 2 (two) times daily. 06/13/13   Tanna Furry, MD  temazepam (RESTORIL) 15 MG capsule Take 15 mg by mouth at bedtime. 06/25/13   Historical Provider, MD  tiotropium (SPIRIVA) 18 MCG inhalation capsule Place 18 mcg into inhaler and inhale daily.    Historical Provider, MD  torsemide (DEMADEX) 20 MG tablet Take 2 tablets (40 mg total) by mouth daily. 06/24/13   Janece Canterbury, MD  traMADol (ULTRAM) 50 MG tablet Take 1 tablet (50 mg total) by mouth daily as needed. 05/23/13 05/23/14  Fayrene Helper, MD  travoprost, benzalkonium, (TRAVATAN Z) 0.004 % ophthalmic solution Place 1 drop into both eyes at bedtime.     Historical Provider, MD  warfarin (COUMADIN) 4 MG tablet Take 4-6 mg by mouth See admin instructions. Patient takes 1 1/2 tablets on Thursdays, Saturdays, Mondays and Wednesdays. Patient takes 1 tablet on Fridays, Sundays, Tuesdays 05/15/13   Historical Provider, MD   BP 122/67  Pulse 84  Temp(Src) 98.1 F (36.7 C) (Oral)  Resp 20  Ht 5\' 6"  (1.676 m)  Wt 144 lb (65.318 kg)  BMI 23.25 kg/m2  SpO2 94% Physical Exam  Nursing note and vitals reviewed. Constitutional: No distress.  HENT:  Head: Normocephalic and atraumatic.  Right Ear: External ear normal.  Left Ear: External ear normal.  Mouth/Throat: No oropharyngeal exudate.  Nasal packing in the right nares, no active bleeding noted, dark crusted blood around the anterior aspect of the nose around the packing, and mucoid discharge with some bloody discoloration on the left side  Eyes: Conjunctivae are normal. Right eye exhibits no discharge. Left  eye exhibits no discharge. No scleral icterus.  Neck: Neck supple. No tracheal deviation present.  Cardiovascular: Normal rate, regular rhythm and intact distal pulses.   Pulmonary/Chest: Effort normal and breath sounds normal. No stridor. No respiratory distress. He has no wheezes. He has no rales.  Abdominal: Soft. Bowel sounds are normal. He exhibits no distension. There is no tenderness. There is no rebound and no guarding.  Musculoskeletal: He exhibits no edema and no tenderness.  Neurological: He is alert. He has normal strength. No cranial nerve deficit (no facial droop, extraocular movements intact, no slurred speech) or sensory deficit. He exhibits normal muscle tone. He displays no  seizure activity. Coordination normal.  Skin: Skin is warm and dry. No rash noted. He is not diaphoretic.  Psychiatric: He has a normal mood and affect.    ED Course  Procedures (including critical care time) Labs Review Labs Reviewed  CBC WITH DIFFERENTIAL - Abnormal; Notable for the following:    WBC 11.4 (*)    RBC 3.11 (*)    Hemoglobin 8.4 (*)    HCT 27.8 (*)    RDW 18.8 (*)    Neutrophils Relative % 80 (*)    Neutro Abs 9.1 (*)    Lymphocytes Relative 8 (*)    Monocytes Absolute 1.2 (*)    All other components within normal limits  COMPREHENSIVE METABOLIC PANEL - Abnormal; Notable for the following:    CO2 34 (*)    BUN 28 (*)    Creatinine, Ser 1.71 (*)    Albumin 3.4 (*)    GFR calc non Af Amer 38 (*)    GFR calc Af Amer 44 (*)    All other components within normal limits  PROTIME-INR  URINALYSIS, ROUTINE W REFLEX MICROSCOPIC    Imaging Review Dg Chest 2 View  07/08/2013   CLINICAL DATA:  Shortness of breath, epistaxis  EXAM: CHEST  2 VIEW  COMPARISON:  06/22/2013  FINDINGS: Lungs are essentially clear. No focal consolidation. No pleural effusion or pneumothorax.  Cardiomegaly.  Visualized osseous structures are within normal limits.  IMPRESSION: No evidence of acute  cardiopulmonary disease.   Electronically Signed   By: Julian Hy M.D.   On: 07/08/2013 13:31      MDM   Final diagnoses:  Epistaxis  Acute blood loss anemia  Acute renal injury   The patient's hemoglobin has dropped to 8.4.  4 days ago the hemoglobin was 10.6.  Additionally, the patient's creatinine has increased to 1.71. He may have a component of acute renal injury associated with his blood loss anemia from the epistaxis. Patient does not appear to be actively bleeding at this time. I will leave the nasal packing in place. I think she may benefit from an ENT consultation. However considering his blood count drop and increase in creatinine I think he should be admitted for further evaluation and monitoring. I will consult the hospitalist. the patient many to be admitted to the hospital so that ENT may be consult did considering there is no ENT specialist at this hospital     Kathalene Frames, MD 07/08/13 1819

## 2013-07-09 LAB — CBC
HCT: 23.3 % — ABNORMAL LOW (ref 39.0–52.0)
Hemoglobin: 7.1 g/dL — ABNORMAL LOW (ref 13.0–17.0)
MCH: 27 pg (ref 26.0–34.0)
MCHC: 30.5 g/dL (ref 30.0–36.0)
MCV: 88.6 fL (ref 78.0–100.0)
PLATELETS: 237 10*3/uL (ref 150–400)
RBC: 2.63 MIL/uL — ABNORMAL LOW (ref 4.22–5.81)
RDW: 19.4 % — ABNORMAL HIGH (ref 11.5–15.5)
WBC: 10.5 10*3/uL (ref 4.0–10.5)

## 2013-07-09 LAB — COMPREHENSIVE METABOLIC PANEL
ALBUMIN: 2.9 g/dL — AB (ref 3.5–5.2)
ALT: 13 U/L (ref 0–53)
AST: 30 U/L (ref 0–37)
Alkaline Phosphatase: 78 U/L (ref 39–117)
BILIRUBIN TOTAL: 0.4 mg/dL (ref 0.3–1.2)
BUN: 31 mg/dL — AB (ref 6–23)
CHLORIDE: 98 meq/L (ref 96–112)
CO2: 26 mEq/L (ref 19–32)
CREATININE: 1.74 mg/dL — AB (ref 0.50–1.35)
Calcium: 8.5 mg/dL (ref 8.4–10.5)
GFR calc Af Amer: 43 mL/min — ABNORMAL LOW (ref >90)
GFR calc non Af Amer: 37 mL/min — ABNORMAL LOW (ref >90)
Glucose, Bld: 93 mg/dL (ref 70–99)
Potassium: 4 mEq/L (ref 3.7–5.3)
Sodium: 141 mEq/L (ref 137–147)
Total Protein: 6.3 g/dL (ref 6.0–8.3)

## 2013-07-09 LAB — PREPARE RBC (CROSSMATCH)

## 2013-07-09 LAB — PRO B NATRIURETIC PEPTIDE: Pro B Natriuretic peptide (BNP): 3842 pg/mL — ABNORMAL HIGH (ref 0–125)

## 2013-07-09 LAB — MAGNESIUM: Magnesium: 1.9 mg/dL (ref 1.5–2.5)

## 2013-07-09 MED ORDER — MAGNESIUM SULFATE 40 MG/ML IJ SOLN
2.0000 g | Freq: Once | INTRAMUSCULAR | Status: AC
  Administered 2013-07-09: 2 g via INTRAVENOUS
  Filled 2013-07-09: qty 50

## 2013-07-09 MED ORDER — CEPHALEXIN 500 MG PO CAPS
500.0000 mg | ORAL_CAPSULE | Freq: Three times a day (TID) | ORAL | Status: DC
  Administered 2013-07-09 – 2013-07-12 (×7): 500 mg via ORAL
  Filled 2013-07-09 (×12): qty 1

## 2013-07-09 MED ORDER — FUROSEMIDE 10 MG/ML IJ SOLN
40.0000 mg | Freq: Once | INTRAMUSCULAR | Status: AC
  Administered 2013-07-10: 40 mg via INTRAVENOUS
  Filled 2013-07-09: qty 4

## 2013-07-09 NOTE — Progress Notes (Signed)
Pt had 11 beats of Vtach. Pt asymptomatic. VSS. Lamar Blinks, NP made aware. Cont to monitor.

## 2013-07-09 NOTE — Progress Notes (Signed)
Pt had 12 beats of vtach. Pt asymptomatic and sleeping at the time. Pt easily aroused. Vitals stable. Dr Tyrell Antonio paged and made aware. Dorna Bloom, RN

## 2013-07-09 NOTE — Progress Notes (Signed)
Utilization review completed.  

## 2013-07-09 NOTE — Progress Notes (Signed)
[facility] PROGRESS NOTE  Mattthew Ziomek Ticas HQI:[id number] DOB: 1940-07-24 DOA: 07/08/2013 PCP: Tula Nakayama, MD  Assessment/Plan: 1-Acute blood loss anemia secondary to epistaxis. Hb at 8.4 from 10 last week. Continue to follow trend.   2-Recurrent epistaxis; INR corrected aT 1.2. Continue to hold coumadin. Bleeding persist. Transfer from [facility] for ENT evaluation. I have consulted Dr Wilburn Cornelia.   3-Acute renal failure, likely secondary to poor by mouth intake and dehydration. NSL to avoid HF exacerbation. Renal function pending for this morning. Might need to resume diuretic when renal function improved.   4-Chronic atrial fibrillation previously on Coumadin, this has been discontinued and his INR today is 1.22. Continue with Cardizem and metoprolol.   5-History of diastolic congestive heart failure, currently compensated. Will resume torsemide when renal function improved.   Code Status: full code.  Family Communication: care discussed with patient.  Disposition Plan: remain inpatient   Consultants:  Dr Wilburn Cornelia.   Procedures:  none  Antibiotics:  none  HPI/Subjective: No worsening dyspnea, still with nose bleed. He has right nostril packed.   Objective: Filed Vitals:   07/09/13 0442  BP: 109/66  Pulse: 84  Temp: 97.4 F (36.3 C)  Resp: 20    Intake/Output Summary (Last 24 hours) at 07/09/13 0815 Last data filed at 07/09/13 0535  Gross per 24 hour  Intake   1160 ml  Output    375 ml  Net    785 ml   Filed Weights   07/08/13 1241 07/08/13 1956 07/09/13 0441  Weight: 65.318 kg (144 lb) 65.318 kg (144 lb) 65.318 kg (144 lb)    Exam:   General:  No distress.   Cardiovascular: S 1, S 2 RRR, positive JVD  Respiratory: bilateral crackles.   Abdomen: BS present, soft, nt, nd.   Musculoskeletal: edema,   Data Reviewed: Basic Metabolic Panel:  Recent Labs Lab 07/04/13 1955 07/05/13 1828 07/06/13 1901 07/08/13 1334 07/09/13 0555  NA 142  141 142 142  --   K 3.6* 3.3* 3.5* 4.3  --   CL 92* 92* 96 96  --   CO2 40* 36* 37* 34*  --   GLUCOSE 100* 166* 100* 86  --   BUN 24* 26* 26* 28*  --   CREATININE 1.12 1.25 1.00 1.71*  --   CALCIUM 9.7 9.3 9.3 9.6  --   MG  --   --   --   --  1.9   Liver Function Tests:  Recent Labs Lab 07/08/13 1334  AST 28  ALT 14  ALKPHOS 98  BILITOT 0.5  PROT 7.5  ALBUMIN 3.4*   No results found for this basename: LIPASE, AMYLASE,  in the last 168 hours No results found for this basename: AMMONIA,  in the last 168 hours CBC:  Recent Labs Lab 07/04/13 1955 07/05/13 1828 07/06/13 1901 07/08/13 1334  WBC 11.5* 10.9* 11.1* 11.4*  NEUTROABS 8.7* 8.2* 9.1* 9.1*  HGB 10.3* 9.1* 8.9* 8.4*  HCT 34.7* 30.7* 29.2* 27.8*  MCV 91.6 90.6 89.6 89.4  PLT 345 301 294 299   Cardiac Enzymes: No results found for this basename: CKTOTAL, CKMB, CKMBINDEX, TROPONINI,  in the last 168 hours BNP (last 3 results)  Recent Labs  06/13/13 1113 06/16/13 1554 06/17/13 0537  PROBNP 5155.0* 3560.0* 3222.0*   CBG: No results found for this basename: GLUCAP,  in the last 168 hours  No results found for this or any previous visit (from the past 240 hour(s)).   Studies:  Dg Chest 2 View  07/08/2013   CLINICAL DATA:  Shortness of breath, epistaxis  EXAM: CHEST  2 VIEW  COMPARISON:  06/22/2013  FINDINGS: Lungs are essentially clear. No focal consolidation. No pleural effusion or pneumothorax.  Cardiomegaly.  Visualized osseous structures are within normal limits.  IMPRESSION: No evidence of acute cardiopulmonary disease.   Electronically Signed   By: Julian Hy M.D.   On: 07/08/2013 13:31    Scheduled Meds: . albuterol  2.5 mg Nebulization BID  . allopurinol  300 mg Oral Daily  . brimonidine  1 drop Both Eyes Q12H   And  . timolol  1 drop Both Eyes BID  . diltiazem  180 mg Oral Daily  . ferrous sulfate  325 mg Oral BID  . hydrALAZINE  25 mg Oral TID  . latanoprost  1 drop Both Eyes QHS  .  metoprolol  50 mg Oral BID  . mirtazapine  7.5 mg Oral QHS  . mometasone-formoterol  2 puff Inhalation BID  . montelukast  10 mg Oral QHS  . temazepam  15 mg Oral QHS  . tiotropium  18 mcg Inhalation Daily   Continuous Infusions:   Active Problems:   HYPERTENSION   Atrial fibrillation   Acute blood loss anemia   Epistaxis, recurrent   ARF (acute renal failure)    Time spent: 35 minutes.     [facility] Pager [phone]. If 7PM-7AM, please contact night-coverage at [email or web address], password [email or web address] 07/09/2013, 8:15 AM  LOS: 1 day

## 2013-07-09 NOTE — Progress Notes (Signed)
Follow-up:  Was asked to see pt s/p tranfer from [facility] to [facility] room 3W-28. Mr Bryant has a known a h/o diastolic congestive heart failure and atrial fibrillation on chronic coumadin.Pt presented to [facility] for c/o intermittent but persistent epistaxis. Pt had been seen in ED several times this week for same. 2 days ago pt had (R) nasal rapid rhino placed and was treated w/ PO Vitamin K for INR of 6.0 then was ultimately d/c'd home. He returned today for recurrence of epistaxis and was noted w/ decreased Hb and increased creatinine. Pt was admitted and transferred to [facility] for ENT evaluation. At bedside pt noted resting w/ eyes closed. His VS are stable. Telemetry reveals A-Fib w/ rate controlled in 70's.  RN reports intermittent oozing from (L) nare. Will order Afrin PRN nosebleeds. Will continue to monitor closely.     Jeryl Columbia, NP-C [facility] Pager [phone]

## 2013-07-09 NOTE — Consult Note (Addendum)
ENT CONSULT:  Reason for Consult: Recurrent epistaxis Referring Physician: [facility]  Theodore [name] is an 73 y.o. male.  HPI: The patient is a 73 year old male with a history of recurrent epistaxis, he has a history of heart failure and atrial fibrillation and is on chronic Coumadin therapy. Anticoagulation therapy discontinued on admission. He has been admitted to the hospital several times with recurrent epistaxis and has been seen in the emergency department at [facility]. The patient was seen for in hospital consultation by Dr. Redmond Baseman in January with similar issues of recurrent epistaxis requiring nasal packing. The patient reports intermittent profuse bleeding, hemoglobin has dropped to 8.4 with his recent episodes. Packing was placed at the emergency department and the patient was transferred [place] for additional evaluation.    Past Medical History  Diagnosis Date  . Anemia     Hemoglobin 11.6 in 04/2008 -> resolved   . Tobacco abuse   . Glaucoma   . Allergic rhinitis   . Atrial fibrillation     Coumadin  . Diastolic heart failure     LVEF 09-38%, rate 2 diastolic dysfunction 03/8297  . Pulmonary hypertension     70 mmHg 03/2012  . Shingles   . Essential hypertension, benign   . COPD (chronic obstructive pulmonary disease)   . Asthma     Past Surgical History  Procedure Laterality Date  . Bilateral cataract surgery    . Herniorrhapy    . Tendon repair      Right hand surgical procedure for a tendon repair  . Colonoscopy N/A 11/29/2012    Procedure: COLONOSCOPY;  Surgeon: Danie Binder, MD;  Location: [facility];  Service: Endoscopy;  Laterality: N/A;  1:00  . Esophagogastroduodenoscopy N/A 11/29/2012    Procedure: ESOPHAGOGASTRODUODENOSCOPY (EGD);  Surgeon: Danie Binder, MD;  Location: [facility];  Service: Endoscopy;  Laterality: N/A;  . Eye surgery    . Hernia repair      Family History  Problem Relation Age of Onset  . Cancer Mother    . Cancer Father   . Coronary artery disease Brother   . Colon cancer Brother     Social History:  reports that he has been smoking Cigarettes.  He has a 11 pack-year smoking history. He has never used smokeless tobacco. He reports that he does not drink alcohol or use illicit drugs.  Allergies:  Allergies  Allergen Reactions  . Aspirin Other (See Comments)    On coumadin    Medications: I have reviewed the patient's current medications.  Results for orders placed during the hospital encounter of 07/08/13 (from the past 48 hour(s))  URINALYSIS, ROUTINE W REFLEX MICROSCOPIC     Status: None   Collection Time    07/08/13  1:24 PM      Result Value Ref Range   Color, Urine YELLOW  YELLOW   APPearance CLEAR  CLEAR   Specific Gravity, Urine 1.020  1.005 - 1.030   pH 6.5  5.0 - 8.0   Glucose, UA NEGATIVE  NEGATIVE mg/dL   Hgb urine dipstick NEGATIVE  NEGATIVE   Bilirubin Urine NEGATIVE  NEGATIVE   Ketones, ur NEGATIVE  NEGATIVE mg/dL   Protein, ur NEGATIVE  NEGATIVE mg/dL   Urobilinogen, UA 0.2  0.0 - 1.0 mg/dL   Nitrite NEGATIVE  NEGATIVE   Leukocytes, UA NEGATIVE  NEGATIVE   Comment: MICROSCOPIC NOT DONE ON URINES WITH NEGATIVE PROTEIN, BLOOD, LEUKOCYTES, NITRITE, OR GLUCOSE <1000 mg/dL.  CBC WITH DIFFERENTIAL     Status: Abnormal   Collection Time    07/08/13  1:34 PM      Result Value Ref Range   WBC 11.4 (*) 4.0 - 10.5 K/uL   RBC 3.11 (*) 4.22 - 5.81 MIL/uL   Hemoglobin 8.4 (*) 13.0 - 17.0 g/dL   HCT 27.8 (*) 39.0 - 52.0 %   MCV 89.4  78.0 - 100.0 fL   MCH 27.0  26.0 - 34.0 pg   MCHC 30.2  30.0 - 36.0 g/dL   RDW 18.8 (*) 11.5 - 15.5 %   Platelets 299  150 - 400 K/uL   Neutrophils Relative % 80 (*) 43 - 77 %   Neutro Abs 9.1 (*) 1.7 - 7.7 K/uL   Lymphocytes Relative 8 (*) 12 - 46 %   Lymphs Abs 0.9  0.7 - 4.0 K/uL   Monocytes Relative 10  3 - 12 %   Monocytes Absolute 1.2 (*) 0.1 - 1.0 K/uL   Eosinophils Relative 2  0 - 5 %   Eosinophils Absolute 0.2  0.0 - 0.7  K/uL   Basophils Relative 0  0 - 1 %   Basophils Absolute 0.0  0.0 - 0.1 K/uL  COMPREHENSIVE METABOLIC PANEL     Status: Abnormal   Collection Time    07/08/13  1:34 PM      Result Value Ref Range   Sodium 142  137 - 147 mEq/L   Potassium 4.3  3.7 - 5.3 mEq/L   Comment: DELTA CHECK NOTED   Chloride 96  96 - 112 mEq/L   CO2 34 (*) 19 - 32 mEq/L   Glucose, Bld 86  70 - 99 mg/dL   BUN 28 (*) 6 - 23 mg/dL   Creatinine, Ser 1.71 (*) 0.50 - 1.35 mg/dL   Comment: DELTA CHECK NOTED   Calcium 9.6  8.4 - 10.5 mg/dL   Total Protein 7.5  6.0 - 8.3 g/dL   Albumin 3.4 (*) 3.5 - 5.2 g/dL   AST 28  0 - 37 U/L   ALT 14  0 - 53 U/L   Alkaline Phosphatase 98  39 - 117 U/L   Total Bilirubin 0.5  0.3 - 1.2 mg/dL   GFR calc non Af Amer 38 (*) >90 mL/min   GFR calc Af Amer 44 (*) >90 mL/min   Comment: (NOTE)     The eGFR has been calculated using the CKD EPI equation.     This calculation has not been validated in all clinical situations.     eGFR's persistently <90 mL/min signify possible Chronic Kidney     Disease.  PROTIME-INR     Status: None   Collection Time    07/08/13  1:34 PM      Result Value Ref Range   Prothrombin Time 15.1  11.6 - 15.2 seconds   INR 1.22  0.00 - 1.49  MAGNESIUM     Status: None   Collection Time    07/09/13  5:55 AM      Result Value Ref Range   Magnesium 1.9  1.5 - 2.5 mg/dL  PRO B NATRIURETIC PEPTIDE     Status: Abnormal   Collection Time    07/09/13  5:55 AM      Result Value Ref Range   Pro B Natriuretic peptide (BNP) 3842.0 (*) 0 - 125 pg/mL  COMPREHENSIVE METABOLIC PANEL     Status: Abnormal   Collection Time    07/09/13  8:30 AM      Result Value Ref Range   Sodium 141  137 - 147 mEq/L   Potassium 4.0  3.7 - 5.3 mEq/L   Chloride 98  96 - 112 mEq/L   CO2 26  19 - 32 mEq/L   Glucose, Bld 93  70 - 99 mg/dL   BUN 31 (*) 6 - 23 mg/dL   Creatinine, Ser 1.74 (*) 0.50 - 1.35 mg/dL   Calcium 8.5  8.4 - 10.5 mg/dL   Total Protein 6.3  6.0 - 8.3 g/dL    Albumin 2.9 (*) 3.5 - 5.2 g/dL   AST 30  0 - 37 U/L   ALT 13  0 - 53 U/L   Alkaline Phosphatase 78  39 - 117 U/L   Total Bilirubin 0.4  0.3 - 1.2 mg/dL   GFR calc non Af Amer 37 (*) >90 mL/min   GFR calc Af Amer 43 (*) >90 mL/min   Comment: (NOTE)     The eGFR has been calculated using the CKD EPI equation.     This calculation has not been validated in all clinical situations.     eGFR's persistently <90 mL/min signify possible Chronic Kidney     Disease.  CBC     Status: Abnormal   Collection Time    07/09/13  8:30 AM      Result Value Ref Range   WBC 10.5  4.0 - 10.5 K/uL   RBC 2.63 (*) 4.22 - 5.81 MIL/uL   Hemoglobin 7.1 (*) 13.0 - 17.0 g/dL   HCT 23.3 (*) 39.0 - 52.0 %   MCV 88.6  78.0 - 100.0 fL   MCH 27.0  26.0 - 34.0 pg   MCHC 30.5  30.0 - 36.0 g/dL   RDW 19.4 (*) 11.5 - 15.5 %   Platelets 237  150 - 400 K/uL    Dg Chest 2 View  07/08/2013   CLINICAL DATA:  Shortness of breath, epistaxis  EXAM: CHEST  2 VIEW  COMPARISON:  06/22/2013  FINDINGS: Lungs are essentially clear. No focal consolidation. No pleural effusion or pneumothorax.  Cardiomegaly.  Visualized osseous structures are within normal limits.  IMPRESSION: No evidence of acute cardiopulmonary disease.   Electronically Signed   By: Julian Hy M.D.   On: 07/08/2013 13:31    ROS: No history of sinusitis, allergic rhinitis or nasal trauma. No prior sinonasal surgery.  Blood pressure 106/75, pulse 70, temperature 97.7 F (36.5 C), temperature source Oral, resp. rate 19, height $RemoveBe'5\' 6"'fuwBTvRly$  (1.676 m), weight 65.318 kg (144 lb), SpO2 98.00%.  PHYSICAL EXAM: General appearance - alert, well appearing, and in no distress and oriented to person, place, and time Nose - right balloon nasal packing in place with some paranasal crusting, no active bleeding from the right nostril, nasopharynx or oropharynx, left nostril patent without discharge or clots. Mouth - mucous membranes moist, pharynx normal without lesions and  edentulous Neck - supple, no significant adenopathy  Studies Reviewed: None  Assessment/Plan: The patient presents with a history of recurrent severe epistaxis and significant drop in hemoglobin. He is currently stable with right nasal balloon packing in place and no active bleeding at this time. In the short term, recommend leaving the packing in place, patient may be discharged with followup in our office early next week with Dr. Redmond Baseman for packing removal. I recommend antibiotic therapy while packing is in place, Keflex. Recommend epistaxis precautions including frequent saline nasal spray, avoiding nasal trauma, no  swelling or straining. The patient may not be a good candidate for continued anticoagulation therapy, should discuss with cardiology. If the patient continues to have significant issues he may be a candidate for intravascular embolization of right posterior nasal circulation for long-term management of posterior epistaxis.  Jerrell Belfast 07/09/2013, 4:45 PM     Addendum: Pt's hx of recurrent Rt Posterior Epistaxis, recurrent bleeding and packing, multiple ER visits and hospitalizations and need for long term anti-coagulation considered and discussed with Dr. Tyrell Antonio. Given above, the patient might benefit from evaluation by Interventional Radiology for Rt post nasal embolization for long term management of these chronic recurrent problems.  Hurman Horn, MD

## 2013-07-09 NOTE — Progress Notes (Signed)
Pt had 20 beats nonsustained VT. Pt asymptomatic and lying in bed. VSS. Dr Tyrell Antonio paged. Dorna Bloom, RN

## 2013-07-10 ENCOUNTER — Encounter (HOSPITAL_COMMUNITY): Payer: Medicare Other

## 2013-07-10 ENCOUNTER — Inpatient Hospital Stay (HOSPITAL_COMMUNITY): Payer: Medicare Other

## 2013-07-10 ENCOUNTER — Encounter (HOSPITAL_COMMUNITY): Payer: Self-pay | Admitting: Radiology

## 2013-07-10 ENCOUNTER — Inpatient Hospital Stay (HOSPITAL_COMMUNITY): Admission: RE | Admit: 2013-07-10 | Payer: Medicare Other | Source: Ambulatory Visit

## 2013-07-10 ENCOUNTER — Encounter (HOSPITAL_COMMUNITY)
Admission: EM | Disposition: A | Discharge status: Home / Self Care | Payer: Self-pay | Source: Home / Self Care | Attending: Internal Medicine

## 2013-07-10 ENCOUNTER — Encounter (HOSPITAL_COMMUNITY): Payer: Medicare Other | Admitting: Certified Registered"

## 2013-07-10 ENCOUNTER — Inpatient Hospital Stay (HOSPITAL_COMMUNITY): Payer: Medicare Other | Admitting: Certified Registered"

## 2013-07-10 DIAGNOSIS — J449 Chronic obstructive pulmonary disease, unspecified: Secondary | ICD-10-CM

## 2013-07-10 DIAGNOSIS — I5032 Chronic diastolic (congestive) heart failure: Secondary | ICD-10-CM

## 2013-07-10 DIAGNOSIS — J9600 Acute respiratory failure, unspecified whether with hypoxia or hypercapnia: Secondary | ICD-10-CM

## 2013-07-10 HISTORY — PX: RADIOLOGY WITH ANESTHESIA: SHX6223

## 2013-07-10 LAB — CBC
HCT: 27.6 % — ABNORMAL LOW (ref 39.0–52.0)
Hemoglobin: 8.6 g/dL — ABNORMAL LOW (ref 13.0–17.0)
MCH: 27.6 pg (ref 26.0–34.0)
MCHC: 31.2 g/dL (ref 30.0–36.0)
MCV: 88.5 fL (ref 78.0–100.0)
Platelets: 211 10*3/uL (ref 150–400)
RBC: 3.12 MIL/uL — ABNORMAL LOW (ref 4.22–5.81)
RDW: 18.7 % — ABNORMAL HIGH (ref 11.5–15.5)
WBC: 10.7 10*3/uL — ABNORMAL HIGH (ref 4.0–10.5)

## 2013-07-10 LAB — BASIC METABOLIC PANEL
BUN: 28 mg/dL — AB (ref 6–23)
CALCIUM: 8.6 mg/dL (ref 8.4–10.5)
CO2: 29 meq/L (ref 19–32)
CREATININE: 1.45 mg/dL — AB (ref 0.50–1.35)
Chloride: 98 mEq/L (ref 96–112)
GFR calc Af Amer: 54 mL/min — ABNORMAL LOW (ref >90)
GFR calc non Af Amer: 46 mL/min — ABNORMAL LOW (ref >90)
Glucose, Bld: 99 mg/dL (ref 70–99)
Potassium: 3.9 mEq/L (ref 3.7–5.3)
Sodium: 138 mEq/L (ref 137–147)

## 2013-07-10 LAB — TROPONIN I: Troponin I: <0.3 ng/mL (ref <0.30)

## 2013-07-10 LAB — PREPARE RBC (CROSSMATCH)

## 2013-07-10 SURGERY — RADIOLOGY WITH ANESTHESIA
Anesthesia: General

## 2013-07-10 MED ORDER — IOHEXOL 300 MG/ML  SOLN
150.0000 mL | Freq: Once | INTRAMUSCULAR | Status: AC | PRN
  Administered 2013-07-10: 125 mL via INTRA_ARTERIAL

## 2013-07-10 MED ORDER — SODIUM CHLORIDE 0.9 % IV SOLN: INTRAVENOUS | Status: DC

## 2013-07-10 MED ORDER — HEPARIN SODIUM (PORCINE) 1000 UNIT/ML IJ SOLN: INTRAMUSCULAR | Status: DC | PRN

## 2013-07-10 MED ORDER — NITROGLYCERIN 1 MG/10 ML FOR IR/CATH LAB
INTRA_ARTERIAL | Status: AC
  Filled 2013-07-10: qty 10

## 2013-07-10 MED ORDER — SUCCINYLCHOLINE CHLORIDE 20 MG/ML IJ SOLN
INTRAMUSCULAR | Status: DC | PRN
  Administered 2013-07-10: 120 mg via INTRAVENOUS

## 2013-07-10 MED ORDER — PROPOFOL 10 MG/ML IV BOLUS
INTRAVENOUS | Status: DC | PRN
  Administered 2013-07-10: 150 mg via INTRAVENOUS
  Administered 2013-07-10: 10 mg via INTRAVENOUS

## 2013-07-10 MED ORDER — GLYCOPYRROLATE 0.2 MG/ML IJ SOLN
INTRAMUSCULAR | Status: DC | PRN
  Administered 2013-07-10: 0.4 mg via INTRAVENOUS

## 2013-07-10 MED ORDER — LIDOCAINE HCL (CARDIAC) 20 MG/ML IV SOLN
INTRAVENOUS | Status: DC | PRN
  Administered 2013-07-10: 50 mg via INTRAVENOUS

## 2013-07-10 MED ORDER — HEPARIN SODIUM (PORCINE) 1000 UNIT/ML IJ SOLN
INTRAMUSCULAR | Status: DC | PRN
  Administered 2013-07-10 (×2): .5 mL via INTRAVENOUS
  Administered 2013-07-10: 3 mL via INTRAVENOUS

## 2013-07-10 MED ORDER — SODIUM CHLORIDE 0.9 % IV SOLN
10.0000 mg | INTRAVENOUS | Status: DC | PRN
  Administered 2013-07-10: 15 ug/min via INTRAVENOUS

## 2013-07-10 MED ORDER — ONDANSETRON HCL 4 MG/2ML IJ SOLN
4.0000 mg | Freq: Four times a day (QID) | INTRAMUSCULAR | Status: DC | PRN
Start: 2013-07-10 — End: 2013-07-13

## 2013-07-10 MED ORDER — ROCURONIUM BROMIDE 100 MG/10ML IV SOLN
INTRAVENOUS | Status: DC | PRN
  Administered 2013-07-10: 30 mg via INTRAVENOUS

## 2013-07-10 MED ORDER — LACTATED RINGERS IV SOLN
INTRAVENOUS | Status: DC | PRN
  Administered 2013-07-10 (×2): via INTRAVENOUS

## 2013-07-10 MED ORDER — ACETAMINOPHEN 500 MG PO TABS: 1000.0000 mg | ORAL_TABLET | Freq: Four times a day (QID) | ORAL | Status: DC | PRN

## 2013-07-10 MED ORDER — ACETAMINOPHEN 650 MG RE SUPP: 650.0000 mg | Freq: Four times a day (QID) | RECTAL | Status: DC | PRN

## 2013-07-10 MED ORDER — NEOSTIGMINE METHYLSULFATE 10 MG/10ML IV SOLN
INTRAVENOUS | Status: DC | PRN
  Administered 2013-07-10: 3 mg via INTRAVENOUS

## 2013-07-10 MED ORDER — NICARDIPINE HCL IN NACL 20-0.86 MG/200ML-% IV SOLN: 5.0000 mg/h | INTRAVENOUS | Status: DC

## 2013-07-10 MED ORDER — CEFAZOLIN SODIUM-DEXTROSE 2-3 GM-% IV SOLR
2.0000 g | Freq: Once | INTRAVENOUS | Status: AC
  Administered 2013-07-10: 2 g via INTRAVENOUS
  Filled 2013-07-10: qty 50

## 2013-07-10 MED ORDER — PHENOL 1.4 % MT LIQD
1.0000 | OROMUCOSAL | Status: DC | PRN
  Filled 2013-07-10: qty 177

## 2013-07-10 MED ORDER — FENTANYL CITRATE 0.05 MG/ML IJ SOLN
INTRAMUSCULAR | Status: DC | PRN
  Administered 2013-07-10 (×2): 50 ug via INTRAVENOUS
  Administered 2013-07-10: 25 ug via INTRAVENOUS

## 2013-07-10 NOTE — Anesthesia Postprocedure Evaluation (Signed)
Anesthesia Post Note  Patient: Theodore [name]  Procedure(s) Performed: Procedure(s) (LRB): EMBOLIZATION-RADIOLOGY WITH ANESTHESIA (N/A)  Anesthesia type: General  Patient location: ICU  Post pain: Pain level controlled  Post assessment: Post-op Vital signs reviewed  Last Vitals:  Filed Vitals:   07/10/13 1605  BP:   Pulse: 66  Temp:   Resp: 18    Post vital signs: stable  Level of consciousness: Patient remains intubated per anesthesia plan  Complications: No apparent anesthesia complications

## 2013-07-10 NOTE — Consult Note (Signed)
PULMONARY / CRITICAL CARE MEDICINE   Name: Theodore [name] MRN: [id number] DOB: August 20, 1940    ADMISSION DATE:  07/08/2013 CONSULTATION DATE:  07/10/2013  REFERRING MD :  Dr. Estanislado Pandy PRIMARY SERVICE:  [facility] --> PCCM   CHIEF COMPLAINT:  Acute respiratory failure  BRIEF PATIENT DESCRIPTION: 73 yo admitted 4/28 with recurrent epistaxis. Underwent external carotid embolization on 4/30 with return to ICU on ventilator.  PCCM consulted for ICU management.     SIGNIFICANT EVENTS / STUDIES:  4/28 - Admit per [facility] with recurrent hemoptysis on coumadin for AFIB 4/30 - IR embolization of external carotid branches, returned to ICU on vent  LINES / TUBES: OETT 4/30 >>> 4/30  CULTURES:  ANTIBIOTICS: Keflex 4/29 >>> Ancef 4/30 x 1  HISTORY OF PRESENT ILLNESS:  73 y/o M with a PMH of glaucoma, seasonal allergies, anemia, diastolic CHF with LVEF 27-06%, PAH (PA peak 74 06/2013) and atrial fibrillation on coumadin who presented to [facility] on 4/28 with recurrent epistaxis.  Patient reported approximately one week of recurrent, spontaneous bleeding and had been seen several times in the last week for the same with nasal packing.  He was discharged on Keflex, frequent nasal saline spray & instructions to avoid trauma.  During last ER visit, it was noted that the patient had an INR of 6.  Coumadin was held and vitamin K dosing administered.  He returned again on 4/28 with repeat nose bleed.  Work up at that time found a reduced hemoglobin to 8.4 and increased serum creatinine.  He was admitted per [facility] for ENT evaluation.  Dr. Wilburn Cornelia evaluated Theodore [name] and recommended IR evaluation for embolization of R posterior nasal circulation for management of posterior epistaxis.  He underwent external carotid embolization on 4/30 with return to ICU on vent.  PCCM consulted for ICU management.  PAST MEDICAL HISTORY :  Past Medical History  Diagnosis Date  . Anemia     Hemoglobin 11.6 in 04/2008 -> resolved   .  Tobacco abuse   . Glaucoma   . Allergic rhinitis   . Atrial fibrillation     Coumadin  . Diastolic heart failure     LVEF 23-76%, rate 2 diastolic dysfunction 04/8313  . Pulmonary hypertension     70 mmHg 03/2012  . Shingles   . Essential hypertension, benign   . COPD (chronic obstructive pulmonary disease)   . Asthma    Past Surgical History  Procedure Laterality Date  . Bilateral cataract surgery    . Herniorrhapy    . Tendon repair      Right hand surgical procedure for a tendon repair  . Colonoscopy N/A 11/29/2012    Procedure: COLONOSCOPY;  Surgeon: Danie Binder, MD;  [facility];  Service: Endoscopy;  Laterality: N/A;  1:00  . Esophagogastroduodenoscopy N/A 11/29/2012    Procedure: ESOPHAGOGASTRODUODENOSCOPY (EGD);  Surgeon: Danie Binder, MD;  Location: [facility];  Service: Endoscopy;  Laterality: N/A;  . Eye surgery    . Hernia repair     Prior to Admission medications   Medication Sig Start Date End Date Taking? Authorizing Provider  albuterol (PROVENTIL) (2.5 MG/3ML) 0.083% nebulizer solution Take 2.5 mg by nebulization 2 (two) times daily.   Yes Historical Provider, MD  allopurinol (ZYLOPRIM) 300 MG tablet Take 1 tablet (300 mg total) by mouth daily. 05/23/13  Yes Fayrene Helper, MD  ALPRAZolam Duanne Moron) 0.25 MG tablet Take 0.25 mg by mouth 2 (two) times daily as needed for  anxiety.   Yes Historical Provider, MD  brimonidine-timolol (COMBIGAN) 0.2-0.5 % ophthalmic solution Place 1 drop into both eyes every 12 (twelve) hours. 06/23/13  Yes Janece Canterbury, MD  budesonide-formoterol [facility]) 160-4.5 MCG/ACT inhaler Inhale 2 puffs into the lungs 2 (two) times daily. 05/23/13  Yes Fayrene Helper, MD  diltiazem (CARDIZEM CD) 180 MG 24 hr capsule Take 180 mg by mouth daily. *Patient states that as of 07/08/2013 he was advised to STOP this medication 06/23/13  Yes Janece Canterbury, MD  ferrous sulfate 325 (65 FE) MG tablet Take 325 mg by mouth 2 (two) times  daily.   Yes Historical Provider, MD  hydrALAZINE (APRESOLINE) 25 MG tablet Take 1 tablet (25 mg total) by mouth 3 (three) times daily. 07/01/13  Yes Herminio Commons, MD  HYDROcodone-acetaminophen (NORCO/VICODIN) 5-325 MG per tablet Take 2 tablets by mouth every 4 (four) hours as needed. 07/06/13  Yes Richarda Blade, MD  metoprolol (LOPRESSOR) 50 MG tablet Take 1 tablet (50 mg total) by mouth 2 (two) times daily. 07/01/13  Yes Fayrene Helper, MD  montelukast (SINGULAIR) 10 MG tablet Take 10 mg by mouth at bedtime.   Yes Historical Provider, MD  potassium chloride (K-DUR) 10 MEQ tablet Take 10 mEq by mouth 2 (two) times daily. 06/13/13  Yes Tanna Furry, MD  sulfamethoxazole-trimethoprim (BACTRIM DS) 800-160 MG per tablet Take 1 tablet by mouth 2 (two) times daily. 5 day course starting on 07/06/2013   Yes Historical Provider, MD  temazepam (RESTORIL) 15 MG capsule Take 15 mg by mouth at bedtime. 06/25/13  Yes Historical Provider, MD  tiotropium (SPIRIVA) 18 MCG inhalation capsule Place 18 mcg into inhaler and inhale daily.   Yes Historical Provider, MD  torsemide (DEMADEX) 20 MG tablet Take 2 tablets (40 mg total) by mouth daily. 06/24/13  Yes Janece Canterbury, MD  travoprost, benzalkonium, (TRAVATAN Z) 0.004 % ophthalmic solution Place 1 drop into both eyes at bedtime.    Yes Historical Provider, MD  albuterol (PROAIR HFA) 108 (90 BASE) MCG/ACT inhaler Inhale 2 puffs into the lungs every 6 (six) hours as needed for wheezing or shortness of breath. 05/23/13   Fayrene Helper, MD  furosemide (LASIX) 40 MG tablet Take 80 mg by mouth daily. 06/09/13   Historical Provider, MD  mirtazapine (REMERON) 7.5 MG tablet Take 1 tablet (7.5 mg total) by mouth at bedtime. 05/21/13   Fayrene Helper, MD  traMADol (ULTRAM) 50 MG tablet Take 1 tablet (50 mg total) by mouth daily as needed. 05/23/13 05/23/14  Fayrene Helper, MD  warfarin (COUMADIN) 4 MG tablet Take 4-6 mg by mouth See admin instructions. Patient  takes 1 1/2 tablets on Thursdays, Saturdays, Mondays and Wednesdays. Patient takes 1 tablet on Fridays, Sundays, Tuesdays 05/15/13   Historical Provider, MD   Allergies  Allergen Reactions  . Aspirin Other (See Comments)    On coumadin   FAMILY HISTORY:  Family History  Problem Relation Age of Onset  . Cancer Mother   . Cancer Father   . Coronary artery disease Brother   . Colon cancer Brother    SOCIAL HISTORY:  reports that he has been smoking Cigarettes.  He has a 11 pack-year smoking history. He has never used smokeless tobacco. He reports that he does not drink alcohol or use illicit drugs.  REVIEW OF SYSTEMS:  Unable to complete as pt is on mechanical ventilation & altered.   INTERVAL HISTORY:   VITAL SIGNS: Temp:  [97.9 F (36.6  C)-98.8 F (37.1 C)] 98.1 F (36.7 C) (04/30 0500) Pulse Rate:  [67-78] 67 (04/30 0951) Resp:  [16-18] 18 (04/30 0500) BP: (109-119)/(54-71) 119/59 mmHg (04/30 0951) SpO2:  [95 %-100 %] 100 % (04/30 0500)  HEMODYNAMICS:   VENTILATOR SETTINGS:   INTAKE / OUTPUT: Intake/Output     04/29 0701 - 04/30 0700 04/30 0701 - 05/01 0700   P.O. 820    I.V. (mL/kg)  500 (7.7)   Blood 394.2    Total Intake(mL/kg) 1214.2 (18.6) 500 (7.7)   Urine (mL/kg/hr) 550 (0.4) 1400 (2.5)   Blood  200 (0.4)   Total Output 550 1600   Net +664.2 -1100         PHYSICAL EXAMINATION: General:  wdwn adult male in NAD Neuro:  Awake, alert, follow commands, appropriate HEENT:  Mm pink/dry, no jvd Cardiovascular:  s1s2 rrr, no m/r/g Lungs:  resp's even/non-labored, lungs bilaterally clear Abdomen:  Round/soft, bsx4 active Musculoskeletal:  No acute deformities  Skin:  Warm/dry, no edema  LABS:  CBC  Recent Labs Lab 07/08/13 1334 07/09/13 0830 07/10/13 0141  WBC 11.4* 10.5 10.7*  HGB 8.4* 7.1* 8.6*  HCT 27.8* 23.3* 27.6*  PLT 299 237 211   Coag's  Recent Labs Lab 07/05/13 1828 07/06/13 1901 07/08/13 1334  INR 1.84* 1.38 1.22   BMET  Recent  Labs Lab 07/08/13 1334 07/09/13 0830 07/10/13 0141  NA 142 141 138  K 4.3 4.0 3.9  CL 96 98 98  CO2 34* 26 29  BUN 28* 31* 28*  CREATININE 1.71* 1.74* 1.45*  GLUCOSE 86 93 99   Electrolytes  Recent Labs Lab 07/08/13 1334 07/09/13 0555 07/09/13 0830 07/10/13 0141  CALCIUM 9.6  --  8.5 8.6  MG  --  1.9  --   --    Sepsis Markers No results found for this basename: LATICACIDVEN, PROCALCITON, O2SATVEN,  in the last 168 hours  ABG No results found for this basename: PHART, PCO2ART, PO2ART,  in the last 168 hours  Liver Enzymes  Recent Labs Lab 07/08/13 1334 07/09/13 0830  AST 28 30  ALT 14 13  ALKPHOS 98 78  BILITOT 0.5 0.4  ALBUMIN 3.4* 2.9*   Cardiac Enzymes  Recent Labs Lab 07/09/13 0555 07/10/13 0141 07/10/13 0759  TROPONINI  --  <0.30 <0.30  PROBNP 3842.0*  --   --    Glucose No results found for this basename: GLUCAP,  in the last 168 hours  IMAGING:  No results found.  ASSESSMENT / PLAN:  PULMONARY A: Acute respiratory failure  Pulmonary hypertension ( 06/2013 PAP 74 torr ), etiology is not clear COPD without evidence of exacerbation P:   -meets extubation criteria, extubate to [place] O2 -titrate oxygen to maintain saturations > 92% -face tent for airway humidification -pulmonary hygiene post extubation  -continue spiriva, dulera, albuterol  CARDIOVASCULAR A:  AF Hypertension  P:  -d/c aline -continue diltiazem 240mg , metoprolol 50 mg QD,  -d/c cardene (not on it upon arrival to ICU)   RENAL A:   AKI  P:   -avoid nephrotoxic agents -NS@75  -trend BMP  GASTROINTESTINAL A:   Nutrition GI Px is not indicated P:   -NPO x 4 hours post extubation, then begin PO's as tolerated  HEMATOLOGIC A:   Coagulopathy secondary to Coumadin Anemia of blood loss P:  -hold coumadin, ? If patient will be able to continue anti-coagulation  -Cardiology following  -trend INR, CBC  INFECTIOUS A:   At risk for sinusitis  P:   Abx as  above  ENDOCRINE A:   Gout P:   -monitor glucose while NPO -d/c allopurinol  NEUROLOGIC A:   Anxiety  Pain  P: -tylenol / ultram / vicodin PRN -continue xanax PRN  -hold remeron / restoril  Noe Gens, NP-C [facility] Pgr: [phone] or [phone]  I have personally obtained history, examined patient, evaluated and interpreted laboratory and imaging results, reviewed medical records, formulated assessment / plan and placed orders.  CRITICAL CARE:  The patient is critically ill with multiple organ systems failure and requires high complexity decision making for assessment and support, frequent evaluation and titration of therapies, application of advanced monitoring technologies and extensive interpretation of multiple databases. Critical Care Time devoted to patient care services described in this note is 35 minutes.   Doree Fudge, MD Pulmonary and [place] Pager: [phone]  07/10/2013, 4:56 PM

## 2013-07-10 NOTE — Procedures (Signed)
S/P  Bilateral common carotid  And bilateral external carotid arterioms ,followed by embolization of IMAAX,infraorbital and palatine branches using PVA particles.Marland Kitchen

## 2013-07-10 NOTE — Consult Note (Signed)
CARDIOLOGY CONSULT NOTE   Patient ID: Theodore [name] MRN: [id number], DOB/AGE: 08/18/1940   Admit date: 07/08/2013 Date of Consult: 07/10/2013   Primary Physician: Theodore Nakayama, MD Primary Cardiologist: Previously Dr. Lattie [name]  Pt. Profile  Elderly gentleman with chronic atrial fibrillation on Coumadin who presents with intractable epistaxis.  Coumadin is on hold.  Problem List  Past Medical History  Diagnosis Date  . Anemia     Hemoglobin 11.6 in 04/2008 -> resolved   . Tobacco abuse   . Glaucoma   . Allergic rhinitis   . Atrial fibrillation     Coumadin  . Diastolic heart failure     LVEF 32-67%, rate 2 diastolic dysfunction 03/2456  . Pulmonary hypertension     70 mmHg 03/2012  . Shingles   . Essential hypertension, benign   . COPD (chronic obstructive pulmonary disease)   . Asthma     Past Surgical History  Procedure Laterality Date  . Bilateral cataract surgery    . Herniorrhapy    . Tendon repair      Right hand surgical procedure for a tendon repair  . Colonoscopy N/A 11/29/2012    Procedure: COLONOSCOPY;  Surgeon: Theodore Binder, MD;  Location: [facility];  Service: Endoscopy;  Laterality: N/A;  1:00  . Esophagogastroduodenoscopy N/A 11/29/2012    Procedure: ESOPHAGOGASTRODUODENOSCOPY (EGD);  Surgeon: Theodore Binder, MD;  Location: [facility];  Service: Endoscopy;  Laterality: N/A;  . Eye surgery    . Hernia repair       Allergies  Allergies  Allergen Reactions  . Aspirin Other (See Comments)    On coumadin    HPI   [name] was admitted on 07/08/13 with a history of severe epistaxis for the previous week.  He has a history of chronic diastolic congestive heart failure.  He also has a history of permanent atrial fibrillation for which he has been on Coumadin anticoagulation.  Several days prior to admission his INR had been as high as 6.0 and his warfarin was discontinued and he was given a dose of oral  vitamin K.  He denies any recent chest pain.  He has a history of diastolic congestive heart failure.  On admission to this hospital his diuretics were held because of worsening renal failure.  He has been seen in consultation by Dr. Jerrell [name], ENT physician, who noted that the patient had been seen previously by his partner in January 2015 with similar issues of recurrent epistaxis.  The patient is now being considered for evaluation and treatment by interventional radiology for right posterior nasal embolization for long-term management of these chronic recurrent problems.  Inpatient Medications  . albuterol  2.5 mg Nebulization BID  . allopurinol  300 mg Oral Daily  . brimonidine  1 drop Both Eyes Q12H   And  . timolol  1 drop Both Eyes BID  . cephALEXin  500 mg Oral 3 times per day  . diltiazem  180 mg Oral Daily  . ferrous sulfate  325 mg Oral BID  . latanoprost  1 drop Both Eyes QHS  . metoprolol  50 mg Oral BID  . mirtazapine  7.5 mg Oral QHS  . mometasone-formoterol  2 puff Inhalation BID  . montelukast  10 mg Oral QHS  . temazepam  15 mg Oral QHS  . tiotropium  18 mcg Inhalation Daily    Family History Family History  Problem Relation Age of Onset  .  Cancer Mother   . Cancer Father   . Coronary artery disease Brother   . Colon cancer Brother      Social History History   Social History  . Marital Status: Widowed    Spouse Name: N/A    Number of Children: 2  . Years of Education: N/A   Occupational History  . Retired   .     Social History Main Topics  . Smoking status: Current Some Day Smoker -- 0.20 packs/day for 55 years    Types: Cigarettes    Last Attempt to Quit: 02/13/2012  . Smokeless tobacco: Never Used     Comment: occasionally smokes  . Alcohol Use: No  . Drug Use: No  . Sexual Activity: No   Other Topics Concern  . Not on file   Social History Narrative  . No narrative on file     Review of Systems  General:  No chills, fever,  night sweats or weight changes.  Cardiovascular:  No chest pain, dyspnea on exertion, edema, orthopnea, palpitations, paroxysmal nocturnal dyspnea. Dermatological: No rash, lesions/masses Respiratory: No cough, dyspnea Urologic: No hematuria, dysuria Abdominal:   No nausea, vomiting, diarrhea, bright red blood per rectum, melena, or hematemesis Neurologic:  No visual changes, wkns, changes in mental status. All other systems reviewed and are otherwise negative except as noted above.  Physical Exam  Blood pressure 119/59, pulse 67, temperature 98.1 F (36.7 C), temperature source Oral, resp. rate 18, height 5\' 6"  (1.676 m), weight 144 lb (65.318 kg), SpO2 100.00%.  General: Pleasant, NAD.  Right nasal packing in place Psych: Normal affect. Neuro: Alert and oriented X 3. Moves all extremities spontaneously. HEENT: Normal  Neck: Supple without bruits or JVD. Lungs:  There are bibasilar fine inspiratory rales Heart: Irregular pulse without gallop.  Soft apical systolic murmur Abdomen: Soft, non-tender, non-distended, BS + x 4.  Extremities: No clubbing, cyanosis or edema. DP/PT/Radials 2+ and equal bilaterally.  Labs   Recent Labs  07/10/13 0141 07/10/13 0759  TROPONINI <0.30 <0.30   Lab Results  Component Value Date   WBC 10.7* 07/10/2013   HGB 8.6* 07/10/2013   HCT 27.6* 07/10/2013   MCV 88.5 07/10/2013   PLT 211 07/10/2013     Recent Labs Lab 07/09/13 0830 07/10/13 0141  NA 141 138  K 4.0 3.9  CL 98 98  CO2 26 29  BUN 31* 28*  CREATININE 1.74* 1.45*  CALCIUM 8.5 8.6  PROT 6.3  --   BILITOT 0.4  --   ALKPHOS 78  --   ALT 13  --   AST 30  --   GLUCOSE 93 99   Lab Results  Component Value Date   CHOL  Value: 123        ATP III CLASSIFICATION:  <200     mg/dL   Desirable  200-239  mg/dL   Borderline High  >=240    mg/dL   High        12/19/2009   HDL 38* 12/19/2009   LDLCALC  Value: 71        Total Cholesterol/HDL:CHD Risk Coronary Heart Disease Risk Table                      Men   Women  1/2 Average Risk   3.4   3.3  Average Risk       5.0   4.4  2 X Average Risk   9.6   7.1  3 X Average Risk  23.4   11.0        Use the calculated Patient Ratio above and the CHD Risk Table to determine the patient's CHD Risk.        ATP III CLASSIFICATION (LDL):  <100     mg/dL   Optimal  100-129  mg/dL   Near or Above                    Optimal  130-159  mg/dL   Borderline  160-189  mg/dL   High  >190     mg/dL   Very High 12/19/2009   TRIG 68 12/19/2009   Lab Results  Component Value Date   DDIMER <0.27 03/29/2012    Radiology/Studies  Dg Chest 2 View  07/08/2013   CLINICAL DATA:  Shortness of breath, epistaxis  EXAM: CHEST  2 VIEW  COMPARISON:  06/22/2013  FINDINGS: Lungs are essentially clear. No focal consolidation. No pleural effusion or pneumothorax.  Cardiomegaly.  Visualized osseous structures are within normal limits.  IMPRESSION: No evidence of acute cardiopulmonary disease.   Electronically Signed   By: Julian Hy M.D.   On: 07/08/2013 13:31   Dg Chest 2 View  06/16/2013   CLINICAL DATA:  Tachycardia.  Shortness of breath.  Chest pain.  EXAM: CHEST  2 VIEW  COMPARISON:  DG CHEST 2 VIEW dated 06/13/2013  FINDINGS: Mediastinum normal. Cardiomegaly with mild pulmonary vascular prominence noted. No pleural effusion. No pulmonary infiltrate. No pneumothorax. No acute osseus abnormality. Degenerative changes thoracic spine.  IMPRESSION: Stable cardiomegaly and pulmonary venous congestion. No evidence of overt pulmonary edema. Exam stable from prior study.   Electronically Signed   By: Marcello Moores  Register   On: 06/16/2013 16:59   Dg Chest 2 View  06/13/2013   CLINICAL DATA:  Bilateral leg swelling and shortness of breath for 1 week.  EXAM: CHEST  2 VIEW  COMPARISON:  03/17/2013  FINDINGS: The cardiac silhouette remains enlarged, unchanged. The lungs remain hyperinflated. There is mild cephalization of pulmonary blood flow, unchanged. No overt pulmonary edema is seen. No  pleural effusion or pneumothorax is identified. There is no airspace consolidation. No acute osseous abnormality is identified.  IMPRESSION: Cardiomegaly and mild pulmonary vascular congestion without overt edema.   Electronically Signed   By: Logan Bores   On: 06/13/2013 11:34   Dg Chest Port 1 View  06/22/2013   CLINICAL DATA:  Follow-up chest radiograph  EXAM: PORTABLE CHEST - 1 VIEW  COMPARISON:  DG CHEST 2 VIEW dated 06/16/2013; DG CHEST 2 VIEW dated 03/17/2013; DG CHEST 1 VIEW dated 05/18/2012  FINDINGS: Grossly unchanged enlarged cardiac silhouette and mediastinal contours given slightly reduced lung volume. Mild pulmonary venous congestion without frank evidence of edema. Slight worsening of bilateral medial basilar heterogeneous opacities. No definite pleural effusion or pneumothorax. Grossly unchanged bones.  IMPRESSION: 1. No definite acute cardiopulmonary disease on this hypoventilated AP portable examination. Further evaluation with a PA and lateral chest radiograph may be obtained as clinically indicated. 2. Mild pulmonary venous congestion and worsening bibasilar atelectasis.   Electronically Signed   By: Sandi Mariscal M.D.   On: 06/22/2013 09:19    ECG  Atrial fibrillation Left axis deviation Inferior infarct , age undetermined   2-D echocardiogram on 06/17/13: Study Conclusions  - Left ventricle: The cavity size was normal. Wall thickness was increased in a pattern of moderate LVH. Systolic function was normal. The estimated ejection fraction was in the  range of 50% to 55%. There is hypokinesis of the basalinferior myocardium. Features are consistent with a pseudonormal left ventricular filling pattern, with concomitant abnormal relaxation and increased filling pressure (grade 2 diastolic dysfunction). - Aortic valve: Mildly calcified annulus. Trileaflet. - Aortic root: The aortic root was mildly ectatic. - Mitral valve: Calcified annulus. Mild regurgitation. - Left atrium: The  atrium was severely dilated. - Right ventricle: The cavity size was mildly to moderately dilated. Systolic function was moderately reduced. - Right atrium: The atrium wasmarkedly dilated. Central venous pressure: 85mm Hg (est). - Tricuspid valve: Moderate regurgitation. - Pulmonary arteries: Systolic pressure was severely increased. PA peak pressure: 31mm Hg (S). - Pericardium, extracardiac: There was no pericardial effusion. Impressions:  - Moderate LVH with normal LV chamber size and LVEF approximately 50%, probable grade 2 diastolic dysfunction. Severe left atrial enlargement. Mild mitral regurgitation. Moderately reduced RV contraction with moderate tricuspid regurgitation. Severely elevated PASP 74 mmHg with marked right atrial enlargement and elevated CVP. MIldly ectatic aortic root.  ASSESSMENT AND PLAN  1.  Permanent atrial fibrillation on long-term Coumadin. 2. chronic diastolic heart failure, compensated at present 3. epistaxis, recurrent 4.  Acute on chronic renal insufficiency, improving.  Recommendation: The patient is an acceptable risk for planned intervention by interventional radiology for embolization for definitive treatment for his recurrent epistaxis.  Continue rate control with metoprolol and diltiazem.  Resume long-term warfarin when okay with ENT and interventional radiology postprocedure. Will follow with you.  Signed, Darlin Coco, MD  07/10/2013, 9:58 AM

## 2013-07-10 NOTE — Anesthesia Preprocedure Evaluation (Addendum)
Anesthesia Evaluation  Patient identified by MRN, date of birth, ID band Patient awake    Airway Mallampati: I TM Distance: >3 FB Neck ROM: Full    Dental  (+) Poor Dentition, Dental Advisory Given, Missing   Pulmonary asthma , COPDCurrent Smoker,  breath sounds clear to auscultation        Cardiovascular hypertension, Pt. on medications and Pt. on home beta blockers +CHF + dysrhythmias Atrial Fibrillation Rhythm:Irregular Rate:Normal     Neuro/Psych Anxiety    GI/Hepatic   Endo/Other    Renal/GU CRFRenal disease     Musculoskeletal   Abdominal (+)  Abdomen: soft. Bowel sounds: normal.  Peds  Hematology  (+) anemia , Significant nasal hemorrhage   Anesthesia Other Findings   Reproductive/Obstetrics                      Anesthesia Physical Anesthesia Plan  ASA: IV  Anesthesia Plan: General   Post-op Pain Management:    Induction: Intravenous  Airway Management Planned: Oral ETT  Additional Equipment: Arterial line  Intra-op Plan:   Post-operative Plan: Extubation in OR  Informed Consent: I have reviewed the patients History and Physical, chart, labs and discussed the procedure including the risks, benefits and alternatives for the proposed anesthesia with the patient or authorized representative who has indicated his/her understanding and acceptance.     Plan Discussed with: CRNA and Surgeon  Anesthesia Plan Comments:         Anesthesia Quick Evaluation

## 2013-07-10 NOTE — Progress Notes (Signed)
Around 330, pt had 5 beats Vtach, asymptomatic. MD notified and told about Magnesium level and supplementation.  No new orders at this time.  Will continue to monitor.

## 2013-07-10 NOTE — Progress Notes (Signed)
[facility] PROGRESS NOTE  Theodore [name] Battle GHW:[id number] DOB: 06/14/1940 DOA: 07/08/2013 PCP: Tula Nakayama, MD  Assessment/Plan: 1-Acute blood loss anemia secondary to epistaxis. Hb at 8.4 from 10 last week. Continue to follow trend. Hb decrease to 7. Received 1 unit PRBC. Hb today at 8.6  2-Recurrent epistaxis; INR  at 1.2. Continue to hold coumadin. Bleeding persist. Transfer from [facility] for [facility] evaluation. I have consulted Dr Wilburn Cornelia. I spoke with Dr Wilburn Cornelia, he recommend IR evaluation for Rt post nasal embolization for long term management of these chronic recurrent problems. Patient for procedure today.    3-Acute renal failure, likely secondary to poor by mouth intake and dehydration. NSL to avoid HF exacerbation. Renal function pending for this morning. Might need to resume diuretic when renal function improved.   4-Chronic atrial fibrillation previously on Coumadin, this has been discontinued and his INR today is 1.22. Continue with Cardizem and metoprolol.   5-History of diastolic congestive heart failure, currently compensated. Will resume torsemide when renal function improved. I have Ask cardio to see patient  for pre op clearance.   Code Status: full code.  Family Communication: care discussed with patient.  Disposition Plan: remain inpatient   Consultants:  Dr Wilburn Cornelia.   Procedures:  none  Antibiotics:  none  HPI/Subjective: No worsening dyspnea,. He has right nostril packed.   Objective: Filed Vitals:   07/10/13 0951  BP: 119/59  Pulse: 67  Temp:   Resp:     Intake/Output Summary (Last 24 hours) at 07/10/13 1014 Last data filed at 07/10/13 0613  Gross per 24 hour  Intake 974.17 ml  Output    550 ml  Net 424.17 ml   Filed Weights   07/08/13 1241 07/08/13 1956 07/09/13 0441  Weight: 65.318 kg (144 lb) 65.318 kg (144 lb) 65.318 kg (144 lb)    Exam:   General:  No distress.   Cardiovascular: S 1, S 2 RRR, positive  JVD  Respiratory: bilateral crackles.   Abdomen: BS present, soft, nt, nd.   Musculoskeletal: edema,   Data Reviewed: Basic Metabolic Panel:  Recent Labs Lab 07/05/13 1828 07/06/13 1901 07/08/13 1334 07/09/13 0555 07/09/13 0830 07/10/13 0141  NA 141 142 142  --  141 138  K 3.3* 3.5* 4.3  --  4.0 3.9  CL 92* 96 96  --  98 98  CO2 36* 37* 34*  --  26 29  GLUCOSE 166* 100* 86  --  93 99  BUN 26* 26* 28*  --  31* 28*  CREATININE 1.25 1.00 1.71*  --  1.74* 1.45*  CALCIUM 9.3 9.3 9.6  --  8.5 8.6  MG  --   --   --  1.9  --   --    Liver Function Tests:  Recent Labs Lab 07/08/13 1334 07/09/13 0830  AST 28 30  ALT 14 13  ALKPHOS 98 78  BILITOT 0.5 0.4  PROT 7.5 6.3  ALBUMIN 3.4* 2.9*   No results found for this basename: LIPASE, AMYLASE,  in the last 168 hours No results found for this basename: AMMONIA,  in the last 168 hours CBC:  Recent Labs Lab 07/04/13 1955 07/05/13 1828 07/06/13 1901 07/08/13 1334 07/09/13 0830 07/10/13 0141  WBC 11.5* 10.9* 11.1* 11.4* 10.5 10.7*  NEUTROABS 8.7* 8.2* 9.1* 9.1*  --   --   HGB 10.3* 9.1* 8.9* 8.4* 7.1* 8.6*  HCT 34.7* 30.7* 29.2* 27.8* 23.3* 27.6*  MCV 91.6 90.6 89.6 89.4 88.6 88.5  PLT 345 301 294 299 237 211   Cardiac Enzymes:  Recent Labs Lab 07/10/13 0141 07/10/13 0759  TROPONINI <0.30 <0.30   BNP (last 3 results)  Recent Labs  06/16/13 1554 06/17/13 0537 07/09/13 0555  PROBNP 3560.0* 3222.0* 3842.0*   CBG: No results found for this basename: GLUCAP,  in the last 168 hours  No results found for this or any previous visit (from the past 240 hour(s)).   Studies: Dg Chest 2 View  07/08/2013   CLINICAL DATA:  Shortness of breath, epistaxis  EXAM: CHEST  2 VIEW  COMPARISON:  06/22/2013  FINDINGS: Lungs are essentially clear. No focal consolidation. No pleural effusion or pneumothorax.  Cardiomegaly.  Visualized osseous structures are within normal limits.  IMPRESSION: No evidence of acute  cardiopulmonary disease.   Electronically Signed   By: Julian Hy M.D.   On: 07/08/2013 13:31    Scheduled Meds: . albuterol  2.5 mg Nebulization BID  . allopurinol  300 mg Oral Daily  . brimonidine  1 drop Both Eyes Q12H   And  . timolol  1 drop Both Eyes BID  . cephALEXin  500 mg Oral 3 times per day  . diltiazem  180 mg Oral Daily  . ferrous sulfate  325 mg Oral BID  . latanoprost  1 drop Both Eyes QHS  . metoprolol  50 mg Oral BID  . mirtazapine  7.5 mg Oral QHS  . mometasone-formoterol  2 puff Inhalation BID  . montelukast  10 mg Oral QHS  . temazepam  15 mg Oral QHS  . tiotropium  18 mcg Inhalation Daily   Continuous Infusions:   Active Problems:   HYPERTENSION   Atrial fibrillation   Acute blood loss anemia   Epistaxis, recurrent   ARF (acute renal failure)    Time spent: 35 minutes.     [facility] Pager [phone]. If 7PM-7AM, please contact night-coverage at [email or web address], password [facility] 07/10/2013, 10:14 AM  LOS: 2 days

## 2013-07-10 NOTE — Anesthesia Procedure Notes (Signed)
Procedure Name: Intubation Date/Time: 07/10/2013 11:02 AM Performed by: Maeola Harman Pre-anesthesia Checklist: Patient identified, Emergency Drugs available, Suction available, Patient being monitored and Timeout performed Patient Re-evaluated:Patient Re-evaluated prior to inductionOxygen Delivery Method: Circle system utilized Preoxygenation: Pre-oxygenation with 100% oxygen Intubation Type: IV induction Ventilation: Mask ventilation without difficulty Laryngoscope Size: Mac and 3 Grade View: Grade I Tube type: Oral Tube size: 7.5 mm Number of attempts: 1 Airway Equipment and Method: Stylet Placement Confirmation: ETT inserted through vocal cords under direct vision,  positive ETCO2 and breath sounds checked- equal and bilateral Secured at: 22 cm Tube secured with: Tape Dental Injury: Teeth and Oropharynx as per pre-operative assessment  Comments: Easy, atraumatic induction and intubation with MAC 3 blade.  Dr. Orene Desanctis verified placement of ETT.  Waldron Session, CRNA

## 2013-07-10 NOTE — Progress Notes (Signed)
Patient ID: Theodore [name], male   DOB: 02/17/1941, 73 y.o.   MRN: [id number]   Pt with recurrent epistaxis Most recently 4/26--packed nares (R) Continue to ooze and came to [facility] ER 4/28 and transferred to [facility] for evaluation and treatment  Has been evaluated by ENT Dr Wilburn Cornelia Packing remains in place Rec: IR evaluation/possible intervention if bleeding continue  No active bleeding per RN now Pt does have cough that produces dark blood tinged sputum  Bun/Cr: 28/1.45 today Will discuss with Dr Estanislado Pandy as to plan RN aware

## 2013-07-10 NOTE — Transfer of Care (Signed)
Immediate Anesthesia Transfer of Care Note  Patient: Theodore [name]  Procedure(s) Performed: Procedure(s): EMBOLIZATION-RADIOLOGY WITH ANESTHESIA (N/A)  Patient Location: Nursing Unit  Anesthesia Type:General  Level of Consciousness: Patient remains intubated per anesthesia plan  Airway & Oxygen Therapy: Patient remains intubated per anesthesia plan  Post-op Assessment: Post -op Vital signs reviewed and stable  Post vital signs: stable  Complications: No apparent anesthesia complications

## 2013-07-10 NOTE — H&P (Signed)
Theodore [name] is an 73 y.o. male.   Chief Complaint: Recurrent epistaxis  Most recent episode x 2 days Admitted to [facility] yesterday for evaluation and treatment Was seen by Dr Wilburn Cornelia- ENT Referred to Dr Estanislado Pandy for evaluation for possible embolization No active bleeding now Rt nares packing intact hgb 8.6 today; Bun/Cr 28/1.45 Pt has been on coumadin daily for atrial fib INR as high as 6.1 just few days ago Off coumadin since admission; INR 1.22 today Dr Estanislado Pandy has seen and examined pt Dr Mare Ferrari has seen and examined pt and has cleared pt cardiac status Anesthesia has seen and examined pt and has cleared pt able to withstand anesthesia Scheduled now for Embolization of External Carotid Branches for Recurrent Epistaxis  HPI: anemia; COPD/asthma; smoker; Afib; CHF; HTN; glaucoma; recurrent epistaxis  Past Medical History  Diagnosis Date  . Anemia     Hemoglobin 11.6 in 04/2008 -> resolved   . Tobacco abuse   . Glaucoma   . Allergic rhinitis   . Atrial fibrillation     Coumadin  . Diastolic heart failure     LVEF 94-17%, rate 2 diastolic dysfunction 06/812  . Pulmonary hypertension     70 mmHg 03/2012  . Shingles   . Essential hypertension, benign   . COPD (chronic obstructive pulmonary disease)   . Asthma     Past Surgical History  Procedure Laterality Date  . Bilateral cataract surgery    . Herniorrhapy    . Tendon repair      Right hand surgical procedure for a tendon repair  . Colonoscopy N/A 11/29/2012    Procedure: COLONOSCOPY;  Surgeon: Danie Binder, MD;  Location: [facility] ENDO SUITE;  Service: Endoscopy;  Laterality: N/A;  1:00  . Esophagogastroduodenoscopy N/A 11/29/2012    Procedure: ESOPHAGOGASTRODUODENOSCOPY (EGD);  Surgeon: Danie Binder, MD;  Location: [facility] ENDO SUITE;  Service: Endoscopy;  Laterality: N/A;  . Eye surgery    . Hernia repair      Family History  Problem Relation Age of Onset  . Cancer Mother   . Cancer Father   . Coronary artery  disease Brother   . Colon cancer Brother    Social History:  reports that he has been smoking Cigarettes.  He has a 11 pack-year smoking history. He has never used smokeless tobacco. He reports that he does not drink alcohol or use illicit drugs.  Allergies:  Allergies  Allergen Reactions  . Aspirin Other (See Comments)    On coumadin    Medications Prior to Admission  Medication Sig Dispense Refill  . albuterol (PROVENTIL) (2.5 MG/3ML) 0.083% nebulizer solution Take 2.5 mg by nebulization 2 (two) times daily.      Marland Kitchen allopurinol (ZYLOPRIM) 300 MG tablet Take 1 tablet (300 mg total) by mouth daily.  30 tablet  6  . ALPRAZolam (XANAX) 0.25 MG tablet Take 0.25 mg by mouth 2 (two) times daily as needed for anxiety.      . brimonidine-timolol (COMBIGAN) 0.2-0.5 % ophthalmic solution Place 1 drop into both eyes every 12 (twelve) hours.  5 mL  0  . budesonide-formoterol (SYMBICORT) 160-4.5 MCG/ACT inhaler Inhale 2 puffs into the lungs 2 (two) times daily.  1 Inhaler  4  . diltiazem (CARDIZEM CD) 180 MG 24 hr capsule Take 180 mg by mouth daily. *Patient states that as of 07/08/2013 he was advised to STOP this medication      . ferrous sulfate 325 (65 FE) MG tablet Take 325 mg by  mouth 2 (two) times daily.      . hydrALAZINE (APRESOLINE) 25 MG tablet Take 1 tablet (25 mg total) by mouth 3 (three) times daily.  90 tablet  6  . HYDROcodone-acetaminophen (NORCO/VICODIN) 5-325 MG per tablet Take 2 tablets by mouth every 4 (four) hours as needed.  6 tablet  0  . metoprolol (LOPRESSOR) 50 MG tablet Take 1 tablet (50 mg total) by mouth 2 (two) times daily.  60 tablet  3  . montelukast (SINGULAIR) 10 MG tablet Take 10 mg by mouth at bedtime.      . potassium chloride (K-DUR) 10 MEQ tablet Take 10 mEq by mouth 2 (two) times daily.      Marland Kitchen sulfamethoxazole-trimethoprim (BACTRIM DS) 800-160 MG per tablet Take 1 tablet by mouth 2 (two) times daily. 5 day course starting on 07/06/2013      . temazepam (RESTORIL)  15 MG capsule Take 15 mg by mouth at bedtime.      Marland Kitchen tiotropium (SPIRIVA) 18 MCG inhalation capsule Place 18 mcg into inhaler and inhale daily.      Marland Kitchen torsemide (DEMADEX) 20 MG tablet Take 2 tablets (40 mg total) by mouth daily.  60 tablet  0  . travoprost, benzalkonium, (TRAVATAN Z) 0.004 % ophthalmic solution Place 1 drop into both eyes at bedtime.       Marland Kitchen albuterol (PROAIR HFA) 108 (90 BASE) MCG/ACT inhaler Inhale 2 puffs into the lungs every 6 (six) hours as needed for wheezing or shortness of breath.  6.7 g  4  . furosemide (LASIX) 40 MG tablet Take 80 mg by mouth daily.      . mirtazapine (REMERON) 7.5 MG tablet Take 1 tablet (7.5 mg total) by mouth at bedtime.  30 tablet  3  . traMADol (ULTRAM) 50 MG tablet Take 1 tablet (50 mg total) by mouth daily as needed.  20 tablet  0  . warfarin (COUMADIN) 4 MG tablet Take 4-6 mg by mouth See admin instructions. Patient takes 1 1/2 tablets on Thursdays, Saturdays, Mondays and Wednesdays. Patient takes 1 tablet on Fridays, Sundays, Tuesdays        Results for orders placed during the hospital encounter of 07/08/13 (from the past 48 hour(s))  URINALYSIS, ROUTINE W REFLEX MICROSCOPIC     Status: None   Collection Time    07/08/13  1:24 PM      Result Value Ref Range   Color, Urine YELLOW  YELLOW   APPearance CLEAR  CLEAR   Specific Gravity, Urine 1.020  1.005 - 1.030   pH 6.5  5.0 - 8.0   Glucose, UA NEGATIVE  NEGATIVE mg/dL   Hgb urine dipstick NEGATIVE  NEGATIVE   Bilirubin Urine NEGATIVE  NEGATIVE   Ketones, ur NEGATIVE  NEGATIVE mg/dL   Protein, ur NEGATIVE  NEGATIVE mg/dL   Urobilinogen, UA 0.2  0.0 - 1.0 mg/dL   Nitrite NEGATIVE  NEGATIVE   Leukocytes, UA NEGATIVE  NEGATIVE   Comment: MICROSCOPIC NOT DONE ON URINES WITH NEGATIVE PROTEIN, BLOOD, LEUKOCYTES, NITRITE, OR GLUCOSE <1000 mg/dL.  CBC WITH DIFFERENTIAL     Status: Abnormal   Collection Time    07/08/13  1:34 PM      Result Value Ref Range   WBC 11.4 (*) 4.0 - 10.5 K/uL    RBC 3.11 (*) 4.22 - 5.81 MIL/uL   Hemoglobin 8.4 (*) 13.0 - 17.0 g/dL   HCT 27.8 (*) 39.0 - 52.0 %   MCV 89.4  78.0 - 100.0  fL   MCH 27.0  26.0 - 34.0 pg   MCHC 30.2  30.0 - 36.0 g/dL   RDW 18.8 (*) 11.5 - 15.5 %   Platelets 299  150 - 400 K/uL   Neutrophils Relative % 80 (*) 43 - 77 %   Neutro Abs 9.1 (*) 1.7 - 7.7 K/uL   Lymphocytes Relative 8 (*) 12 - 46 %   Lymphs Abs 0.9  0.7 - 4.0 K/uL   Monocytes Relative 10  3 - 12 %   Monocytes Absolute 1.2 (*) 0.1 - 1.0 K/uL   Eosinophils Relative 2  0 - 5 %   Eosinophils Absolute 0.2  0.0 - 0.7 K/uL   Basophils Relative 0  0 - 1 %   Basophils Absolute 0.0  0.0 - 0.1 K/uL  COMPREHENSIVE METABOLIC PANEL     Status: Abnormal   Collection Time    07/08/13  1:34 PM      Result Value Ref Range   Sodium 142  137 - 147 mEq/L   Potassium 4.3  3.7 - 5.3 mEq/L   Comment: DELTA CHECK NOTED   Chloride 96  96 - 112 mEq/L   CO2 34 (*) 19 - 32 mEq/L   Glucose, Bld 86  70 - 99 mg/dL   BUN 28 (*) 6 - 23 mg/dL   Creatinine, Ser 1.71 (*) 0.50 - 1.35 mg/dL   Comment: DELTA CHECK NOTED   Calcium 9.6  8.4 - 10.5 mg/dL   Total Protein 7.5  6.0 - 8.3 g/dL   Albumin 3.4 (*) 3.5 - 5.2 g/dL   AST 28  0 - 37 U/L   ALT 14  0 - 53 U/L   Alkaline Phosphatase 98  39 - 117 U/L   Total Bilirubin 0.5  0.3 - 1.2 mg/dL   GFR calc non Af Amer 38 (*) >90 mL/min   GFR calc Af Amer 44 (*) >90 mL/min   Comment: (NOTE)     The eGFR has been calculated using the CKD EPI equation.     This calculation has not been validated in all clinical situations.     eGFR's persistently <90 mL/min signify possible Chronic Kidney     Disease.  PROTIME-INR     Status: None   Collection Time    07/08/13  1:34 PM      Result Value Ref Range   Prothrombin Time 15.1  11.6 - 15.2 seconds   INR 1.22  0.00 - 1.49  MAGNESIUM     Status: None   Collection Time    07/09/13  5:55 AM      Result Value Ref Range   Magnesium 1.9  1.5 - 2.5 mg/dL  PRO B NATRIURETIC PEPTIDE     Status:  Abnormal   Collection Time    07/09/13  5:55 AM      Result Value Ref Range   Pro B Natriuretic peptide (BNP) 3842.0 (*) 0 - 125 pg/mL  COMPREHENSIVE METABOLIC PANEL     Status: Abnormal   Collection Time    07/09/13  8:30 AM      Result Value Ref Range   Sodium 141  137 - 147 mEq/L   Potassium 4.0  3.7 - 5.3 mEq/L   Chloride 98  96 - 112 mEq/L   CO2 26  19 - 32 mEq/L   Glucose, Bld 93  70 - 99 mg/dL   BUN 31 (*) 6 - 23 mg/dL   Creatinine, Ser 1.74 (*)  0.50 - 1.35 mg/dL   Calcium 8.5  8.4 - 10.5 mg/dL   Total Protein 6.3  6.0 - 8.3 g/dL   Albumin 2.9 (*) 3.5 - 5.2 g/dL   AST 30  0 - 37 U/L   ALT 13  0 - 53 U/L   Alkaline Phosphatase 78  39 - 117 U/L   Total Bilirubin 0.4  0.3 - 1.2 mg/dL   GFR calc non Af Amer 37 (*) >90 mL/min   GFR calc Af Amer 43 (*) >90 mL/min   Comment: (NOTE)     The eGFR has been calculated using the CKD EPI equation.     This calculation has not been validated in all clinical situations.     eGFR's persistently <90 mL/min signify possible Chronic Kidney     Disease.  CBC     Status: Abnormal   Collection Time    07/09/13  8:30 AM      Result Value Ref Range   WBC 10.5  4.0 - 10.5 K/uL   RBC 2.63 (*) 4.22 - 5.81 MIL/uL   Hemoglobin 7.1 (*) 13.0 - 17.0 g/dL   HCT 23.3 (*) 39.0 - 52.0 %   MCV 88.6  78.0 - 100.0 fL   MCH 27.0  26.0 - 34.0 pg   MCHC 30.5  30.0 - 36.0 g/dL   RDW 19.4 (*) 11.5 - 15.5 %   Platelets 237  150 - 400 K/uL  PREPARE RBC (CROSSMATCH)     Status: None   Collection Time    07/09/13  3:42 PM      Result Value Ref Range   Order Confirmation ORDER PROCESSED BY BLOOD BANK    TYPE AND SCREEN     Status: None   Collection Time    07/09/13  7:21 PM      Result Value Ref Range   ABO/RH(D) O POS     Antibody Screen NEG     Sample Expiration 07/12/2013     Unit Number [id number]     Blood Component Type RED CELLS,LR     Unit division 00     Status of Unit ISSUED,FINAL     Transfusion Status OK TO TRANSFUSE     Crossmatch  Result Compatible     Unit Number [id number]     Blood Component Type RED CELLS,LR     Unit division 00     Status of Unit ISSUED     Transfusion Status OK TO TRANSFUSE     Crossmatch Result Compatible     Unit Number [id number]     Blood Component Type RBC LR PHER2     Unit division 00     Status of Unit ISSUED     Transfusion Status OK TO TRANSFUSE     Crossmatch Result Compatible    CBC     Status: Abnormal   Collection Time    07/10/13  1:41 AM      Result Value Ref Range   WBC 10.7 (*) 4.0 - 10.5 K/uL   RBC 3.12 (*) 4.22 - 5.81 MIL/uL   Hemoglobin 8.6 (*) 13.0 - 17.0 g/dL   Comment: DELTA CHECK NOTED     POST TRANSFUSION SPECIMEN   HCT 27.6 (*) 39.0 - 52.0 %   MCV 88.5  78.0 - 100.0 fL   MCH 27.6  26.0 - 34.0 pg   MCHC 31.2  30.0 - 36.0 g/dL   RDW 18.7 (*) 11.5 - 15.5 %   Platelets  211  150 - 400 K/uL  TROPONIN I     Status: None   Collection Time    07/10/13  1:41 AM      Result Value Ref Range   Troponin I <0.30  <0.30 ng/mL   Comment:            Due to the release kinetics of cTnI,     a negative result within the first hours     of the onset of symptoms does not rule out     myocardial infarction with certainty.     If myocardial infarction is still suspected,     repeat the test at appropriate intervals.  BASIC METABOLIC PANEL     Status: Abnormal   Collection Time    07/10/13  1:41 AM      Result Value Ref Range   Sodium 138  137 - 147 mEq/L   Potassium 3.9  3.7 - 5.3 mEq/L   Chloride 98  96 - 112 mEq/L   CO2 29  19 - 32 mEq/L   Glucose, Bld 99  70 - 99 mg/dL   BUN 28 (*) 6 - 23 mg/dL   Creatinine, Ser 1.45 (*) 0.50 - 1.35 mg/dL   Calcium 8.6  8.4 - 10.5 mg/dL   GFR calc non Af Amer 46 (*) >90 mL/min   GFR calc Af Amer 54 (*) >90 mL/min   Comment: (NOTE)     The eGFR has been calculated using the CKD EPI equation.     This calculation has not been validated in all clinical situations.     eGFR's persistently <90 mL/min signify possible Chronic  Kidney     Disease.  TROPONIN I     Status: None   Collection Time    07/10/13  7:59 AM      Result Value Ref Range   Troponin I <0.30  <0.30 ng/mL   Comment:            Due to the release kinetics of cTnI,     a negative result within the first hours     of the onset of symptoms does not rule out     myocardial infarction with certainty.     If myocardial infarction is still suspected,     repeat the test at appropriate intervals.   Dg Chest 2 View  07/08/2013   CLINICAL DATA:  Shortness of breath, epistaxis  EXAM: CHEST  2 VIEW  COMPARISON:  06/22/2013  FINDINGS: Lungs are essentially clear. No focal consolidation. No pleural effusion or pneumothorax.  Cardiomegaly.  Visualized osseous structures are within normal limits.  IMPRESSION: No evidence of acute cardiopulmonary disease.   Electronically Signed   By: Julian Hy M.D.   On: 07/08/2013 13:31    Review of Systems  Constitutional: Negative for fever.  HENT: Positive for nosebleeds.   Respiratory: Positive for cough, sputum production, shortness of breath and wheezing. Negative for hemoptysis.        Cough still does produce dark blood in sputum  Cardiovascular: Negative for chest pain.  Gastrointestinal: Negative for heartburn, nausea, vomiting and abdominal pain.  Neurological: Positive for weakness. Negative for dizziness and headaches.  Psychiatric/Behavioral: Positive for substance abuse.       Smoker    Blood pressure 119/59, pulse 67, temperature 98.1 F (36.7 C), temperature source Oral, resp. rate 18, height '5\' 6"'  (1.676 m), weight 65.318 kg (144 lb), SpO2 100.00%. Physical Exam  Constitutional: He is oriented  to person, place, and time. He appears well-nourished.  HENT:  Rt nares packing intact; no visible bleeding   Cardiovascular:  No murmur heard. Irreg  Respiratory: Effort normal. He has wheezes. He has rales.  GI: Soft. Bowel sounds are normal. There is no tenderness.  Musculoskeletal: Normal  range of motion.  Neurological: He is alert and oriented to person, place, and time.  Skin: Skin is warm and dry.  Psychiatric: He has a normal mood and affect. His behavior is normal. Judgment and thought content normal.     Assessment/Plan Recurrent epistaxis Most recent now x 2 days Has been off coumadin since admission (uses for A fib); INR now 1.22 Rt nares packed w/o active bleeding Scheduled for embolization of external carotid branches for recurrent epistaxis Pt aware of procedure benefits and risks and agreeable to proceed Consent signed and in chart Pt is able to consent for self and has signed consent We have called sister Lelan Pons with his permission to inform her of procedure twice Not able to reach her; left message to call RN  Lavonia Drafts 07/10/2013, 10:47 AM

## 2013-07-11 ENCOUNTER — Encounter (HOSPITAL_COMMUNITY): Payer: Self-pay | Admitting: Interventional Radiology

## 2013-07-11 DIAGNOSIS — I5033 Acute on chronic diastolic (congestive) heart failure: Secondary | ICD-10-CM

## 2013-07-11 LAB — CBC WITH DIFFERENTIAL/PLATELET
BASOS ABS: 0 10*3/uL (ref 0.0–0.1)
Basophils Relative: 0 % (ref 0–1)
EOS ABS: 0.4 10*3/uL (ref 0.0–0.7)
EOS PCT: 3 % (ref 0–5)
HCT: 26 % — ABNORMAL LOW (ref 39.0–52.0)
Hemoglobin: 8 g/dL — ABNORMAL LOW (ref 13.0–17.0)
LYMPHS ABS: 0.7 10*3/uL (ref 0.7–4.0)
LYMPHS PCT: 6 % — AB (ref 12–46)
MCH: 27.8 pg (ref 26.0–34.0)
MCHC: 30.8 g/dL (ref 30.0–36.0)
MCV: 90.3 fL (ref 78.0–100.0)
Monocytes Absolute: 1.6 10*3/uL — ABNORMAL HIGH (ref 0.1–1.0)
Monocytes Relative: 14 % — ABNORMAL HIGH (ref 3–12)
NEUTROS PCT: 77 % (ref 43–77)
Neutro Abs: 9.1 10*3/uL — ABNORMAL HIGH (ref 1.7–7.7)
Platelets: 174 10*3/uL (ref 150–400)
RBC: 2.88 MIL/uL — AB (ref 4.22–5.81)
RDW: 19.3 % — ABNORMAL HIGH (ref 11.5–15.5)
WBC: 11.7 10*3/uL — AB (ref 4.0–10.5)

## 2013-07-11 LAB — BASIC METABOLIC PANEL
BUN: 20 mg/dL (ref 6–23)
CO2: 24 mEq/L (ref 19–32)
Calcium: 7.7 mg/dL — ABNORMAL LOW (ref 8.4–10.5)
Chloride: 103 mEq/L (ref 96–112)
Creatinine, Ser: 1.07 mg/dL (ref 0.50–1.35)
GFR calc Af Amer: 77 mL/min — ABNORMAL LOW (ref >90)
GFR calc non Af Amer: 67 mL/min — ABNORMAL LOW (ref >90)
Glucose, Bld: 89 mg/dL (ref 70–99)
Potassium: 4 mEq/L (ref 3.7–5.3)
Sodium: 140 mEq/L (ref 137–147)

## 2013-07-11 LAB — POCT ACTIVATED CLOTTING TIME
Activated Clotting Time: 149 seconds
Activated Clotting Time: 199 seconds
Activated Clotting Time: 166 seconds

## 2013-07-11 MED ORDER — ALBUTEROL SULFATE HFA 108 (90 BASE) MCG/ACT IN AERS
2.0000 | INHALATION_SPRAY | RESPIRATORY_TRACT | Status: DC | PRN
  Administered 2013-07-11: 2 via RESPIRATORY_TRACT
  Filled 2013-07-11: qty 6.7

## 2013-07-11 MED ORDER — ALBUTEROL SULFATE HFA 108 (90 BASE) MCG/ACT IN AERS: 2.0000 | INHALATION_SPRAY | RESPIRATORY_TRACT | Status: DC | PRN

## 2013-07-11 MED ORDER — TORSEMIDE 20 MG PO TABS
40.0000 mg | ORAL_TABLET | Freq: Every day | ORAL | Status: DC
  Administered 2013-07-11 – 2013-07-13 (×3): 40 mg via ORAL
  Filled 2013-07-11 (×3): qty 2

## 2013-07-11 MED ORDER — METOPROLOL TARTRATE 50 MG PO TABS
75.0000 mg | ORAL_TABLET | Freq: Two times a day (BID) | ORAL | Status: DC
  Administered 2013-07-11 – 2013-07-13 (×4): 75 mg via ORAL
  Filled 2013-07-11 (×5): qty 1

## 2013-07-11 MED ORDER — POLYETHYLENE GLYCOL 3350 17 G PO PACK
17.0000 g | PACK | Freq: Every day | ORAL | Status: DC
  Administered 2013-07-12 – 2013-07-13 (×2): 17 g via ORAL
  Filled 2013-07-11 (×3): qty 1

## 2013-07-11 NOTE — Consult Note (Signed)
PULMONARY / CRITICAL CARE MEDICINE   Name: Theodore [name] MRN: [id number] DOB: Sep 07, 1940    ADMISSION DATE:  07/08/2013 CONSULTATION DATE:  07/10/2013  REFERRING MD :  Dr. Estanislado Pandy PRIMARY SERVICE:  TRH --> PCCM   CHIEF COMPLAINT:  Acute respiratory failure  BRIEF PATIENT DESCRIPTION: 73 yo admitted 4/28 with recurrent epistaxis. Underwent external carotid embolization on 4/30 with return to ICU on ventilator.  PCCM consulted for ICU management.     SIGNIFICANT EVENTS / STUDIES:  4/28 - Admit per [facility] with recurrent hemoptysis on coumadin for AFIB 4/30 - IR embolization of external carotid branches, returned to ICU on vent 4/30 extubated  LINES / TUBES: OETT 4/30 >>> 4/30  CULTURES:  ANTIBIOTICS: Keflex 4/29 >>> Ancef 4/30 x 1  INTERVAL HISTORY:  No distress walking in room  VITAL SIGNS: Temp:  [97.3 F (36.3 C)-98.8 F (37.1 C)] 98.2 F (36.8 C) (05/01 0730) Pulse Rate:  [27-121] 94 (05/01 0800) Resp:  [16-29] 22 (05/01 0800) BP: (105-146)/(57-83) 117/72 mmHg (05/01 0600) SpO2:  [85 %-100 %] 97 % (05/01 0800) FiO2 (%):  [40 %-100 %] 40 % (05/01 0730) Weight:  [67.5 kg (148 lb 13 oz)] 67.5 kg (148 lb 13 oz) (04/30 1530)  HEMODYNAMICS:   VENTILATOR SETTINGS: Vent Mode:  [-] PRVC FiO2 (%):  [40 %-100 %] 40 % Set Rate:  [20 bmp] 20 bmp Vt Set:  [550 mL] 550 mL PEEP:  [5 cmH20] 5 cmH20 INTAKE / OUTPUT: Intake/Output     04/30 0701 - 05/01 0700 05/01 0701 - 05/02 0700   P.O.     I.V. (mL/kg) 1700 (25.2)    Blood     Total Intake(mL/kg) 1700 (25.2)    Urine (mL/kg/hr) 2150 (1.3)    Stool  1 (0)   Blood 200 (0.1)    Total Output 2350 1   Net -650 -1         PHYSICAL EXAMINATION: General:  No distress Neuro:  Awake, alert, follow commands, appropriate HEENT:  Mm pink/dry, no jvd Cardiovascular:  s1s2 rrr, no m/r/g Lungs:  CTA Abdomen:  Round/soft, bsx4 active Musculoskeletal:  No acute deformities  Skin:  Warm/dry, no edema  LABS:  CBC  Recent  Labs Lab 07/09/13 0830 07/10/13 0141 07/11/13 0317  WBC 10.5 10.7* 11.7*  HGB 7.1* 8.6* 8.0*  HCT 23.3* 27.6* 26.0*  PLT 237 211 174   Coag's  Recent Labs Lab 07/05/13 1828 07/06/13 1901 07/08/13 1334  INR 1.84* 1.38 1.22   BMET  Recent Labs Lab 07/09/13 0830 07/10/13 0141 07/11/13 0317  NA 141 138 140  K 4.0 3.9 4.0  CL 98 98 103  CO2 26 29 24   BUN 31* 28* 20  CREATININE 1.74* 1.45* 1.07  GLUCOSE 93 99 89   Electrolytes  Recent Labs Lab 07/09/13 0555 07/09/13 0830 07/10/13 0141 07/11/13 0317  CALCIUM  --  8.5 8.6 7.7*  MG 1.9  --   --   --    Sepsis Markers No results found for this basename: LATICACIDVEN, PROCALCITON, O2SATVEN,  in the last 168 hours  ABG No results found for this basename: PHART, PCO2ART, PO2ART,  in the last 168 hours  Liver Enzymes  Recent Labs Lab 07/08/13 1334 07/09/13 0830  AST 28 30  ALT 14 13  ALKPHOS 98 78  BILITOT 0.5 0.4  ALBUMIN 3.4* 2.9*   Cardiac Enzymes  Recent Labs Lab 07/09/13 0555 07/10/13 0141 07/10/13 0759  TROPONINI  --  <0.30 <0.30  PROBNP 3842.0*  --   --    Glucose No results found for this basename: GLUCAP,  in the last 168 hours  IMAGING:  No results found.  ASSESSMENT / PLAN:  PULMONARY A: Acute respiratory failure  Pulmonary hypertension ( 06/2013 PAP 74 torr ), etiology is not clear COPD without evidence of exacerbation P:   -meets extubation criteria, extubate to [place] O2 -titrate oxygen to maintain saturations > 92% -face tent for airway humidification- he is not using this now -continue spiriva, dulera, albuterol -ambulate and check sats pr dc  CARDIOVASCULAR A:  AF Hypertension  P:  -consider dc tele -may need further increase metop -continue diltiazem 240mg , metoprolol 50 mg QD  RENAL A:   AKI  P:   -avoid nephrotoxic agents -NS@75  - sL  GASTROINTESTINAL A:   Nutrition GI Px is not indicated P:   -diet  HEMATOLOGIC A:   Coagulopathy secondary to  Coumadin Anemia of blood loss P:  -hold coumadin, per cards in future -cbc in am  -Cardiology following   ENDOCRINE A:   Gout P:   -monitor glucose when am labs r sent -d/c allopurinol  NEUROLOGIC A:   Anxiety  Pain  P: -tylenol / ultram / vicodin PRN -continue xanax PRN  -hold remeron / restoril   Will sign off, call if needed  [name]. Titus Mould, MD, [place] Pgr: [facility]

## 2013-07-11 NOTE — Progress Notes (Signed)
[facility] PROGRESS NOTE  Caz Weaver Giacomo VCB:[id number] DOB: Sep 29, 1940 DOA: 07/08/2013 PCP: Tula Nakayama, MD  Assessment/Plan: 1-Acute blood loss anemia secondary to epistaxis. Hb at 8.4 from 10 last week. Continue to follow trend. Hb decrease to 7. Received 1 unit PRBC. Hb today at 8.0  2-Recurrent epistaxis; INR  at 1.2. Continue to hold coumadin. Transfer from [facility] for ENT evaluation. I have consulted Dr Wilburn Cornelia. I spoke with Dr Wilburn Cornelia, he recommend IR evaluation for Rt post nasal embolization for long term management of these chronic recurrent problems. 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 Doing well post procedure.    3-Acute renal failure, likely secondary to poor by mouth intake and dehydration.  Renal function improved. Cr at 1.07. Will resume torsemide.   4-Chronic atrial fibrillation, SVT:  previously on Coumadin, this has been discontinued and his INR today is 1.22. Continue with Cardizem and metoprolol.   5-History of diastolic congestive heart failure, currently compensated. Will resume torsemide. Cardio following.  6-Constipation: start Miralax.  Code Status: full code.  Family Communication: care discussed with patient.  Disposition Plan: remain inpatient, transfer out of ICU today.    Consultants:  Dr Wilburn Cornelia.   Procedures:  none  Antibiotics:  none  HPI/Subjective: Alert awake, breathing well. No complaints.   Objective: Filed Vitals:   07/11/13 0600  BP: 117/72  Pulse: 93  Temp:   Resp: 22    Intake/Output Summary (Last 24 hours) at 07/11/13 0743 Last data filed at 07/11/13 0600  Gross per 24 hour  Intake   1700 ml  Output   2350 ml  Net   -650 ml   Filed Weights   07/08/13 1956 07/09/13 0441 07/10/13 1530  Weight: 65.318 kg (144 lb) 65.318 kg (144 lb) 67.5 kg (148 lb 13 oz)     Exam:   General:  No distress.   Cardiovascular: S 1, S 2 RRR, positive JVD  Respiratory: bilateral crackles.   Abdomen: BS present, soft, nt, nd.   Musculoskeletal: edema,   Data Reviewed: Basic Metabolic Panel:  Recent Labs Lab 07/06/13 1901 07/08/13 1334 07/09/13 0555 07/09/13 0830 07/10/13 0141 07/11/13 0317  NA 142 142  --  141 138 140  K 3.5* 4.3  --  4.0 3.9 4.0  CL 96 96  --  98 98 103  CO2 37* 34*  --  26 29 24   GLUCOSE 100* 86  --  93 99 89  BUN 26* 28*  --  31* 28* 20  CREATININE 1.00 1.71*  --  1.74* 1.45* 1.07  CALCIUM 9.3 9.6  --  8.5 8.6 7.7*  MG  --   --  1.9  --   --   --    Liver Function Tests:  Recent Labs Lab 07/08/13 1334 07/09/13 0830  AST 28 30  ALT 14 13  ALKPHOS 98 78  BILITOT 0.5 0.4  PROT 7.5 6.3  ALBUMIN 3.4* 2.9*   No results found for this basename: LIPASE, AMYLASE,  in the last 168 hours No results found for this basename: AMMONIA,  in the last 168 hours CBC:  Recent Labs Lab 07/04/13 1955 07/05/13 1828 07/06/13 1901 07/08/13 1334 07/09/13 0830 07/10/13 0141 07/11/13 0317  WBC 11.5* 10.9* 11.1* 11.4* 10.5 10.7* 11.7*  NEUTROABS 8.7* 8.2* 9.1* 9.1*  --   --  9.1*  HGB 10.3* 9.1* 8.9* 8.4* 7.1* 8.6* 8.0*  HCT 34.7* 30.7* 29.2* 27.8* 23.3* 27.6* 26.0*  MCV 91.6 90.6 89.6 89.4 88.6 88.5 90.3  PLT 345 301 294 299 237 211 174   Cardiac Enzymes:  Recent Labs Lab 07/10/13 0141 07/10/13 0759  TROPONINI <0.30 <0.30   BNP (last 3 results)  Recent Labs  06/16/13 1554 06/17/13 0537 07/09/13 0555  PROBNP 3560.0* 3222.0* 3842.0*   CBG: No results found for this basename: GLUCAP,  in the last 168 hours  No results found for this or any previous visit (from the past 240 hour(s)).   Studies: No results found.  Scheduled Meds: . albuterol  2.5 mg Nebulization BID  . brimonidine  1 drop Both Eyes Q12H   And  . timolol  1 drop Both Eyes BID  . cephALEXin  500 mg Oral 3 times per day  . diltiazem  180 mg  Oral Daily  . ferrous sulfate  325 mg Oral BID  . latanoprost  1 drop Both Eyes QHS  . metoprolol  50 mg Oral BID  . mometasone-formoterol  2 puff Inhalation BID  . montelukast  10 mg Oral QHS  . tiotropium  18 mcg Inhalation Daily  . torsemide  40 mg Oral Daily   Continuous Infusions:   Active Problems:   HYPERTENSION   Atrial fibrillation   Acute blood loss anemia   Epistaxis, recurrent   ARF (acute renal failure)    Time spent: 30 minutes.     [facility] Pager [phone]. If 7PM-7AM, please contact night-coverage at [email or web address], password [email or web address] 07/11/2013, 7:43 AM  LOS: 3 days

## 2013-07-11 NOTE — Progress Notes (Signed)
73 year old African American gentleman was admitted on 07/08/13 with a history of severe epistaxis for the previous week. He has a history of chronic diastolic congestive heart failure. He also has a history of permanent atrial fibrillation for which he has been on Coumadin anticoagulation. Several days prior to admission his INR had been as high as 6.0 and his warfarin was discontinued and he was given a dose of oral vitamin K. He denies any recent chest pain. He has a history of diastolic congestive heart failure. On admission to this hospital his diuretics were held because of worsening renal failure. He has been seen in consultation by Dr. Jerrell Belfast, ENT physician, who noted that the patient had been seen previously by his partner in January 2015 with similar issues of recurrent epistaxis. The patient is now being considered for evaluation and treatment by interventional radiology for right posterior nasal embolization for long-term management of these chronic recurrent problems.   Post op day 1  B carotid artery arteriogram with embolization of IMAAX; infraorbital; palatine branches 4/30  -extubated today.    Subjective: No chest pain, no SOB, a fib continues with abberency  Objective: Vital signs in last 24 hours: Temp:  [97.3 F (36.3 C)-98.8 F (37.1 C)] 98.2 F (36.8 C) (05/01 0730) Pulse Rate:  [27-121] 94 (05/01 0800) Resp:  [16-29] 26 (05/01 1200) BP: (105-146)/(55-90) 111/55 mmHg (05/01 1200) SpO2:  [85 %-100 %] 99 % (05/01 1200) FiO2 (%):  [40 %-100 %] 40 % (05/01 0730) Weight:  [148 lb 13 oz (67.5 kg)] 148 lb 13 oz (67.5 kg) (04/30 1530) Weight change:  Last BM Date: 07/08/13 Intake/Output from previous day: 04/30 0701 - 05/01 0700 In: 1700 [I.V.:1700] Out: 2350 [Urine:2150; Blood:200] Intake/Output this shift: Total I/O In: -  Out: 201 [Urine:200; Stool:1]  PE: General:Pleasant affect, NAD Skin:Warm and dry, brisk capillary  refill HEENT:normocephalic, sclera clear, mucus membranes moist, packing rt nare Heart:irreg irreg with soft systolic murmur, no gallup, rub or click Lungs:with scattered crackles, no rhonchi, or wheezes HEN:IDPO, non tender, + BS, do not palpate liver spleen or masses Ext:no lower ext edema, 2+ pedal pulses, 2+ radial pulses Neuro:alert and oriented, MAE, follows commands, + facial symmetry   Lab Results:  Recent Labs  07/10/13 0141 07/11/13 0317  WBC 10.7* 11.7*  HGB 8.6* 8.0*  HCT 27.6* 26.0*  PLT 211 174   BMET  Recent Labs  07/10/13 0141 07/11/13 0317  NA 138 140  K 3.9 4.0  CL 98 103  CO2 29 24  GLUCOSE 99 89  BUN 28* 20  CREATININE 1.45* 1.07  CALCIUM 8.6 7.7*    Recent Labs  07/10/13 0141 07/10/13 0759  TROPONINI <0.30 <0.30     Lab Results  Component Value Date   HGBA1C 5.8* 07/07/2013     Lab Results  Component Value Date   TSH 2.580 06/18/2013    Hepatic Function Panel  Recent Labs  07/09/13 0830  PROT 6.3  ALBUMIN 2.9*  AST 30  ALT 13  ALKPHOS 78  BILITOT 0.4   No results found for this basename: CHOL,  in the last 72 hours No results found for this basename: PROTIME,  in the last 72 hours     Studies/Results: No results found.  Medications: I have reviewed the patient's current medications. Scheduled Meds: . albuterol  2.5 mg Nebulization BID  . brimonidine  1 drop Both Eyes Q12H   And  . timolol  1  drop Both Eyes BID  . cephALEXin  500 mg Oral 3 times per day  . diltiazem  180 mg Oral Daily  . ferrous sulfate  325 mg Oral BID  . latanoprost  1 drop Both Eyes QHS  . metoprolol  75 mg Oral BID  . mometasone-formoterol  2 puff Inhalation BID  . montelukast  10 mg Oral QHS  . polyethylene glycol  17 g Oral Daily  . tiotropium  18 mcg Inhalation Daily  . torsemide  40 mg Oral Daily   Continuous Infusions:  PRN Meds:.acetaminophen, acetaminophen, albuterol, ALPRAZolam, HYDROcodone-acetaminophen, ondansetron (ZOFRAN) IV,  oxymetazoline, phenol, traMADol  Assessment/Plan: Active Problems:   HYPERTENSION   Atrial fibrillation--permanent-Continue rate control with metoprolol and diltiazem. Resume long-term warfarin when okay with ENT and interventional radiology post-procedure--embolization procedure done due to multiple recurrent episode of epistaxis.    Acute blood loss anemia- troponin negative h/h 8.0/26   Epistaxis, recurrent   ARF (acute renal failure)--improved    LOS: 3 days   Time spent with pt. :15 minutes. Cecilie Kicks  Nurse Practitioner Certified Pager [phone] or after 5pm and on weekends call [phone] 07/11/2013, 12:39 PM  Pt seen & examined.  Chart Reviewed.  I agree with Ms. Ingold's history & findings, impression & recommendations.  Relatively stable from a cardiac standpoint with no active issues.  Afib rate controlled, warfarin obviously held due to epistaxis.  As for timing of when to restart => I would wait at least a week to ensure no further bleed, then restart @ home dose.  Will need to have INR check & visit with his coumadin clinic practitioner to determine if home dose needs to change (probably @ [facility] -- he couldn't remember).  Otherwise continue remaining home meds.  Leonie Man, MD

## 2013-07-11 NOTE — Progress Notes (Signed)
Pt with 2-11 beat runs of vtach. Fredirick Maudlin NP notified. No orders received. Will monitor.

## 2013-07-11 NOTE — Progress Notes (Signed)
1 Day Post-Op  Subjective: Recurrent epistaxis B carotid artery arteriogram with embolization of IMAAX; infraorbital; palatine branches 4/30  Doing well  Objective: Vital signs in last 24 hours: Temp:  [97.3 F (36.3 C)-98.8 F (37.1 C)] 98.2 F (36.8 C) (05/01 0730) Pulse Rate:  [27-121] 94 (05/01 0800) Resp:  [16-29] 22 (05/01 0800) BP: (105-146)/(57-83) 117/72 mmHg (05/01 0600) SpO2:  [85 %-100 %] 97 % (05/01 0800) FiO2 (%):  [40 %-100 %] 40 % (05/01 0730) Weight:  [67.5 kg (148 lb 13 oz)] 67.5 kg (148 lb 13 oz) (04/30 1530) Last BM Date: 07/08/13  Intake/Output from previous day: 04/30 0701 - 05/01 0700 In: 1700 [I.V.:1700] Out: 2350 [Urine:2150; Blood:200] Intake/Output this shift:    PE:  Afeb; vss Still breathing heavily at times Balloon still intact at R nares L nares has some blood tinged mucus Up in chair Rt groin NT; no bleeding; no hematoma   Lab Results:   Recent Labs  07/10/13 0141 07/11/13 0317  WBC 10.7* 11.7*  HGB 8.6* 8.0*  HCT 27.6* 26.0*  PLT 211 174   BMET  Recent Labs  07/10/13 0141 07/11/13 0317  NA 138 140  K 3.9 4.0  CL 98 103  CO2 29 24  GLUCOSE 99 89  BUN 28* 20  CREATININE 1.45* 1.07  CALCIUM 8.6 7.7*   PT/INR  Recent Labs  07/08/13 1334  LABPROT 15.1  INR 1.22   ABG No results found for this basename: PHART, PCO2, PO2, HCO3,  in the last 72 hours  Studies/Results: No results found.  Anti-infectives: Anti-infectives   Start     Dose/Rate Route Frequency Ordered Stop   07/10/13 1045  ceFAZolin (ANCEF) IVPB 2 g/50 mL premix     2 g 100 mL/hr over 30 Minutes Intravenous  Once 07/10/13 1035 07/10/13 1100   07/09/13 1730  cephALEXin (KEFLEX) capsule 500 mg     500 mg Oral 3 times per day 07/09/13 1655 07/16/13 1359      Assessment/Plan: s/p Procedure(s): EMBOLIZATION-RADIOLOGY WITH ANESTHESIA (N/A)  B carotid artery arteriogram with embo of IMAAX ; infraorbital and palatine branches 4/30 Has done  well Balloon in Rt nares plan per ENT Can transfer to floor    LOS: 3 days    Theodore [name] 07/11/2013

## 2013-07-11 NOTE — Progress Notes (Signed)
1331 report received from [place], [place] Transferred in via wheelchair . Ambulated to the chair . Pt make comfortable with  warm blanket . Nose packing in place . Breathing easy and regular . Slightly dim breathsound . On room air   No  . Overt bleeding noted

## 2013-07-12 LAB — BASIC METABOLIC PANEL
BUN: 22 mg/dL (ref 6–23)
CALCIUM: 8.5 mg/dL (ref 8.4–10.5)
CO2: 27 mEq/L (ref 19–32)
Chloride: 102 mEq/L (ref 96–112)
Creatinine, Ser: 1.07 mg/dL (ref 0.50–1.35)
GFR, EST AFRICAN AMERICAN: 77 mL/min — AB (ref >90)
GFR, EST NON AFRICAN AMERICAN: 67 mL/min — AB (ref >90)
Glucose, Bld: 104 mg/dL — ABNORMAL HIGH (ref 70–99)
POTASSIUM: 3.5 meq/L — AB (ref 3.7–5.3)
SODIUM: 140 meq/L (ref 137–147)

## 2013-07-12 LAB — CBC
HCT: 27.5 % — ABNORMAL LOW (ref 39.0–52.0)
Hemoglobin: 8.5 g/dL — ABNORMAL LOW (ref 13.0–17.0)
MCH: 27.6 pg (ref 26.0–34.0)
MCHC: 30.9 g/dL (ref 30.0–36.0)
MCV: 89.3 fL (ref 78.0–100.0)
PLATELETS: 188 10*3/uL (ref 150–400)
RBC: 3.08 MIL/uL — ABNORMAL LOW (ref 4.22–5.81)
RDW: 19.4 % — AB (ref 11.5–15.5)
WBC: 13.2 10*3/uL — ABNORMAL HIGH (ref 4.0–10.5)

## 2013-07-12 MED ORDER — FUROSEMIDE 80 MG PO TABS
80.0000 mg | ORAL_TABLET | Freq: Every day | ORAL | Status: DC
  Administered 2013-07-12 – 2013-07-13 (×2): 80 mg via ORAL
  Filled 2013-07-12 (×2): qty 1

## 2013-07-12 MED ORDER — POTASSIUM CHLORIDE CRYS ER 20 MEQ PO TBCR
40.0000 meq | EXTENDED_RELEASE_TABLET | Freq: Two times a day (BID) | ORAL | Status: DC
Start: 2013-07-12 — End: 2013-07-13
  Administered 2013-07-12 – 2013-07-13 (×3): 40 meq via ORAL
  Filled 2013-07-12 (×3): qty 2

## 2013-07-12 NOTE — Progress Notes (Signed)
   ENT Progress Note: POD #2 s/p Procedure(s): EMBOLIZATION-RADIOLOGY WITH ANESTHESIA   Subjective: Feeling well, no bleeding  Objective: Vital signs in last 24 hours: Temp:  [97.6 F (36.4 C)-98.7 F (37.1 C)] 97.7 F (36.5 C) (05/02 0532) Pulse Rate:  [68-76] 68 (05/02 0532) Resp:  [18-28] 18 (05/02 0532) BP: (111-140)/(55-90) 117/80 mmHg (05/02 0532) SpO2:  [95 %-99 %] 96 % (05/02 0532) Weight:  [67.586 kg (149 lb)-67.9 kg (149 lb 11.1 oz)] 67.586 kg (149 lb) (05/02 0532) Weight change: 0.4 kg (14.1 oz) Last BM Date: 07/11/13  Intake/Output from previous day: 05/01 0701 - 05/02 0700 In: 612 [P.O.:612] Out: 1028 [Urine:1026; Stool:2] Intake/Output this shift:    Labs:  Recent Labs  07/11/13 0317 07/12/13 0500  WBC 11.7* 13.2*  HGB 8.0* 8.5*  HCT 26.0* 27.5*  PLT 174 188    Recent Labs  07/11/13 0317 07/12/13 0500  NA 140 140  K 4.0 3.5*  CL 103 102  CO2 24 27  GLUCOSE 89 104*  BUN 20 22  CALCIUM 7.7* 8.5    Studies/Results: No results found.   PHYSICAL EXAM: Rt nasal packing removed, min d/c    Assessment/Plan: Pt stable after embolization, no further epistaxis Cont epistaxis precautions including: no nose blowing, lifting or straining, nasal trauma. Cont frequent nasal saline. Plan d/c when pt medically stable    Jerrell Belfast 07/12/2013, 9:09 AM

## 2013-07-12 NOTE — Progress Notes (Signed)
Primary cardiologist: Dr. Kate Sable  Subjective:   Up in chair. Still having intermittent nose bleeding. No chest pain or palpitations.    Objective:   Temp:  [97.6 F (36.4 C)-98.7 F (37.1 C)] 97.7 F (36.5 C) (05/02 0532) Pulse Rate:  [68-76] 68 (05/02 0532) Resp:  [18-28] 18 (05/02 0532) BP: (111-137)/(55-80) 117/80 mmHg (05/02 0532) SpO2:  [95 %-99 %] 96 % (05/02 0532) Weight:  [149 lb (67.586 kg)-149 lb 11.1 oz (67.9 kg)] 149 lb (67.586 kg) (05/02 0532) Last BM Date: 07/11/13  Filed Weights   07/10/13 1530 07/11/13 1442 07/12/13 0532  Weight: 148 lb 13 oz (67.5 kg) 149 lb 11.1 oz (67.9 kg) 149 lb (67.586 kg)    Intake/Output Summary (Last 24 hours) at 07/12/13 1036 Last data filed at 07/12/13 0946  Gross per 24 hour  Intake    852 ml  Output   1627 ml  Net   -775 ml    Exam:  General: No distress.  Lungs: Clear, nonlabored.  Cardiac: Irregularly irregular, no gallop.  Extremities: Trace edema.   Lab Results:  Basic Metabolic Panel:  Recent Labs Lab 07/09/13 0555  07/10/13 0141 07/11/13 0317 07/12/13 0500  NA  --   < > 138 140 140  K  --   < > 3.9 4.0 3.5*  CL  --   < > 98 103 102  CO2  --   < > 29 24 27   GLUCOSE  --   < > 99 89 104*  BUN  --   < > 28* 20 22  CREATININE  --   < > 1.45* 1.07 1.07  CALCIUM  --   < > 8.6 7.7* 8.5  MG 1.9  --   --   --   --   < > = values in this interval not displayed.   CBC:  Recent Labs Lab 07/10/13 0141 07/11/13 0317 07/12/13 0500  WBC 10.7* 11.7* 13.2*  HGB 8.6* 8.0* 8.5*  HCT 27.6* 26.0* 27.5*  MCV 88.5 90.3 89.3  PLT 211 174 188    Coagulation:  Recent Labs Lab 07/05/13 1828 07/06/13 1901 07/08/13 1334  INR 1.84* 1.38 1.22     Medications:   Scheduled Medications: . albuterol  2.5 mg Nebulization BID  . brimonidine  1 drop Both Eyes Q12H   And  . timolol  1 drop Both Eyes BID  . diltiazem  180 mg Oral Daily  . ferrous sulfate  325 mg Oral BID  . furosemide  80 mg  Oral Daily  . latanoprost  1 drop Both Eyes QHS  . metoprolol  75 mg Oral BID  . mometasone-formoterol  2 puff Inhalation BID  . montelukast  10 mg Oral QHS  . polyethylene glycol  17 g Oral Daily  . potassium chloride  40 mEq Oral BID  . tiotropium  18 mcg Inhalation Daily  . torsemide  40 mg Oral Daily      PRN Medications:  acetaminophen, acetaminophen, albuterol, albuterol, ALPRAZolam, HYDROcodone-acetaminophen, ondansetron (ZOFRAN) IV, oxymetazoline, phenol, traMADol   Assessment:   1. Permanent atrial fibrillation, continue heart rate control strategy, on metoprolol and diltiazem. Coumadin is being held with recent recurrent epistaxis.  2. Acute blood loss anemia, hemoglobin of 8.5.  3. History of diastolic heart failure, LVEF 50-55% with grade 2 diastolic dysfunction.  4. COPD.  5. Acute renal insufficiency, creatinine down to 1.0.   Plan/Discussion:    Continue to hold Coumadin, recurrent epistaxis/clot  this morning. No other change to cardiac regimen at this time.   Satira Sark, M.D., F.A.C.C.

## 2013-07-12 NOTE — Progress Notes (Signed)
[facility] PROGRESS NOTE  Theodore [name] HYW:[id number] DOB: [birth year] DOA: 07/08/2013 PCP: Tula Nakayama, MD  Assessment/Plan: 1-Acute blood loss anemia secondary to epistaxis. Hb at 8.4 from 10 last week. Continue to follow trend. Hb decrease to 7. Received 1 unit PRBC. Hb today at 8.5  2-Recurrent epistaxis; INR  at 1.2. Continue to hold coumadin. Transfer from [facility] for E[facility] evaluation. I have consulted Dr Wilburn Cornelia. I spoke with Dr Wilburn Cornelia, he recommend IR evaluation for Rt post nasal embolization for long term management of these chronic recurrent problems. 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 Doing well post procedure.  Right nasal packing removed.  Ok to discontinue antibiotics per ENT.   3-Acute renal failure, likely secondary to poor by mouth intake and dehydration.  Renal function improved. Cr at 1.07. Continue with  torsemide. Will resume lasix today.   4-Chronic atrial fibrillation, SVT:  previously on Coumadin, this has been discontinued and his INR today is 1.22. Continue with Cardizem and metoprolol.   5-History of diastolic congestive heart failure, currently compensated. Continue with  torsemide. Cardio following. Resume lasix today.  6-Constipation: Continue with Miralax. 7-leukocytosis; no fever. Repeat cbc in am.  8-Diarrhea; stop antibiotics. Check for c diff.   Code Status: full code.  Family Communication: care discussed with patient.  Disposition Plan: remain inpatient, transfer out of ICU today.    Consultants:  Dr Wilburn Cornelia.   Procedures:  none  Antibiotics:  none  HPI/Subjective: Feeling well. No significant dyspnea. Relates 2 watery stool.   Objective: Filed Vitals:   07/12/13 0532  BP: 117/80  Pulse: 68  Temp: 97.7 F (36.5 C)  Resp: 18    Intake/Output Summary (Last 24 hours)  at 07/12/13 0855 Last data filed at 07/11/13 2227  Gross per 24 hour  Intake    612 ml  Output   1028 ml  Net   -416 ml   Filed Weights   07/10/13 1530 07/11/13 1442 07/12/13 0532  Weight: 67.5 kg (148 lb 13 oz) 67.9 kg (149 lb 11.1 oz) 67.586 kg (149 lb)    Exam:   General:  No distress.   Cardiovascular: S 1, S 2 RRR, positive JVD  Respiratory: bilateral fine crackles.   Abdomen: BS present, soft, nt, nd.   Musculoskeletal: edema,   Data Reviewed: Basic Metabolic Panel:  Recent Labs Lab 07/08/13 1334 07/09/13 0555 07/09/13 0830 07/10/13 0141 07/11/13 0317 07/12/13 0500  NA 142  --  141 138 140 140  K 4.3  --  4.0 3.9 4.0 3.5*  CL 96  --  98 98 103 102  CO2 34*  --  26 29 24 27   GLUCOSE 86  --  93 99 89 104*  BUN 28*  --  31* 28* 20 22  CREATININE 1.71*  --  1.74* 1.45* 1.07 1.07  CALCIUM 9.6  --  8.5 8.6 7.7* 8.5  MG  --  1.9  --   --   --   --    Liver Function Tests:  Recent Labs Lab 07/08/13 1334 07/09/13 0830  AST 28 30  ALT 14 13  ALKPHOS 98 78  BILITOT 0.5 0.4  PROT 7.5 6.3  ALBUMIN 3.4* 2.9*   No results found for this basename: LIPASE, AMYLASE,  in the last 168 hours No results found for this basename: AMMONIA,  in the last 168 hours CBC:  Recent  Labs Lab 07/05/13 1828 07/06/13 1901 07/08/13 1334 07/09/13 0830 07/10/13 0141 07/11/13 0317 07/12/13 0500  WBC 10.9* 11.1* 11.4* 10.5 10.7* 11.7* 13.2*  NEUTROABS 8.2* 9.1* 9.1*  --   --  9.1*  --   HGB 9.1* 8.9* 8.4* 7.1* 8.6* 8.0* 8.5*  HCT 30.7* 29.2* 27.8* 23.3* 27.6* 26.0* 27.5*  MCV 90.6 89.6 89.4 88.6 88.5 90.3 89.3  PLT 301 294 299 237 211 174 188   Cardiac Enzymes:  Recent Labs Lab 07/10/13 0141 07/10/13 0759  TROPONINI <0.30 <0.30   BNP (last 3 results)  Recent Labs  06/16/13 1554 06/17/13 0537 07/09/13 0555  PROBNP 3560.0* 3222.0* 3842.0*   CBG: No results found for this basename: GLUCAP,  in the last 168 hours  No results found for this or any previous  visit (from the past 240 hour(s)).   Studies: No results found.  Scheduled Meds: . albuterol  2.5 mg Nebulization BID  . brimonidine  1 drop Both Eyes Q12H   And  . timolol  1 drop Both Eyes BID  . diltiazem  180 mg Oral Daily  . ferrous sulfate  325 mg Oral BID  . furosemide  80 mg Oral Daily  . latanoprost  1 drop Both Eyes QHS  . metoprolol  75 mg Oral BID  . mometasone-formoterol  2 puff Inhalation BID  . montelukast  10 mg Oral QHS  . polyethylene glycol  17 g Oral Daily  . tiotropium  18 mcg Inhalation Daily  . torsemide  40 mg Oral Daily   Continuous Infusions:   Active Problems:   HYPERTENSION   Atrial fibrillation   Acute blood loss anemia   Epistaxis, recurrent   ARF (acute renal failure)    Time spent: 30 minutes.     [facility] Pager [phone]. If 7PM-7AM, please contact night-coverage at [email or web address], password [email or web address] 07/12/2013, 8:55 AM  LOS: 4 days

## 2013-07-13 LAB — TYPE AND SCREEN
ABO/RH(D): O POS
Antibody Screen: NEGATIVE
Unit division: 0
Unit division: 0
Unit division: 0

## 2013-07-13 LAB — BASIC METABOLIC PANEL
BUN: 22 mg/dL (ref 6–23)
CO2: 28 meq/L (ref 19–32)
CREATININE: 1.23 mg/dL (ref 0.50–1.35)
Calcium: 8.7 mg/dL (ref 8.4–10.5)
Chloride: 101 mEq/L (ref 96–112)
GFR calc non Af Amer: 56 mL/min — ABNORMAL LOW (ref >90)
GFR, EST AFRICAN AMERICAN: 65 mL/min — AB (ref >90)
Glucose, Bld: 115 mg/dL — ABNORMAL HIGH (ref 70–99)
Potassium: 4.7 mEq/L (ref 3.7–5.3)
Sodium: 141 mEq/L (ref 137–147)

## 2013-07-13 LAB — CBC
HCT: 27.1 % — ABNORMAL LOW (ref 39.0–52.0)
Hemoglobin: 8.4 g/dL — ABNORMAL LOW (ref 13.0–17.0)
MCH: 27.8 pg (ref 26.0–34.0)
MCHC: 31 g/dL (ref 30.0–36.0)
MCV: 89.7 fL (ref 78.0–100.0)
Platelets: 174 10*3/uL (ref 150–400)
RBC: 3.02 MIL/uL — ABNORMAL LOW (ref 4.22–5.81)
RDW: 19.1 % — ABNORMAL HIGH (ref 11.5–15.5)
WBC: 12.5 10*3/uL — ABNORMAL HIGH (ref 4.0–10.5)

## 2013-07-13 NOTE — Progress Notes (Signed)
Spoke with PA on call for the weekend, no follow up or discharge recommendations.

## 2013-07-13 NOTE — Progress Notes (Signed)
Primary cardiologist: Dr. Kate Sable  Subjective:   No active nosebleed. No chest pain or palpitations.    Objective:   Temp:  [97.9 F (36.6 C)-98.3 F (36.8 C)] 97.9 F (36.6 C) (05/03 0453) Pulse Rate:  [60-69] 64 (05/03 0453) Resp:  [18-20] 18 (05/03 0453) BP: (101-114)/(66-75) 110/74 mmHg (05/03 0453) SpO2:  [95 %-98 %] 96 % (05/03 0453) Weight:  [149 lb 6.4 oz (67.767 kg)] 149 lb 6.4 oz (67.767 kg) (05/03 0453) Last BM Date: 07/12/13  Filed Weights   07/11/13 1442 07/12/13 0532 07/13/13 0453  Weight: 149 lb 11.1 oz (67.9 kg) 149 lb (67.586 kg) 149 lb 6.4 oz (67.767 kg)    Intake/Output Summary (Last 24 hours) at 07/13/13 0732 Last data filed at 07/12/13 1830  Gross per 24 hour  Intake    720 ml  Output    600 ml  Net    120 ml    Exam:  General: No distress.  Lungs: Clear, nonlabored.  Cardiac: Irregularly irregular, no gallop.  Extremities: Trace edema.   Lab Results:  Basic Metabolic Panel:  Recent Labs Lab 07/09/13 0555  07/10/13 0141 07/11/13 0317 07/12/13 0500  NA  --   < > 138 140 140  K  --   < > 3.9 4.0 3.5*  CL  --   < > 98 103 102  CO2  --   < > 29 24 27   GLUCOSE  --   < > 99 89 104*  BUN  --   < > 28* 20 22  CREATININE  --   < > 1.45* 1.07 1.07  CALCIUM  --   < > 8.6 7.7* 8.5  MG 1.9  --   --   --   --   < > = values in this interval not displayed.   CBC:  Recent Labs Lab 07/10/13 0141 07/11/13 0317 07/12/13 0500  WBC 10.7* 11.7* 13.2*  HGB 8.6* 8.0* 8.5*  HCT 27.6* 26.0* 27.5*  MCV 88.5 90.3 89.3  PLT 211 174 188    Coagulation:  Recent Labs Lab 07/06/13 1901 07/08/13 1334  INR 1.38 1.22     Medications:   Scheduled Medications: . albuterol  2.5 mg Nebulization BID  . brimonidine  1 drop Both Eyes Q12H   And  . timolol  1 drop Both Eyes BID  . diltiazem  180 mg Oral Daily  . ferrous sulfate  325 mg Oral BID  . furosemide  80 mg Oral Daily  . latanoprost  1 drop Both Eyes QHS  .  metoprolol  75 mg Oral BID  . mometasone-formoterol  2 puff Inhalation BID  . montelukast  10 mg Oral QHS  . polyethylene glycol  17 g Oral Daily  . potassium chloride  40 mEq Oral BID  . tiotropium  18 mcg Inhalation Daily  . torsemide  40 mg Oral Daily     PRN Medications: acetaminophen, acetaminophen, albuterol, albuterol, ALPRAZolam, HYDROcodone-acetaminophen, ondansetron (ZOFRAN) IV, oxymetazoline, phenol, traMADol   Assessment:   1. Permanent atrial fibrillation, continue heart rate control strategy, on metoprolol and diltiazem. Coumadin is being held with recent recurrent epistaxis.  2. Acute blood loss anemia, hemoglobin of 8.5.  3. History of diastolic heart failure, LVEF 50-55% with grade 2 diastolic dysfunction.  4. COPD.  5. Acute renal insufficiency, creatinine down to 1.0.   Plan/Discussion:    Would continue to hold Coumadin, stay off at least a week following last episode  of epistaxis. He can followup in the [place] office with Dr. Bronson Ing or NP to help get him back on Coumadin and followed in the Coumadin clinic   Satira Sark, M.D., F.A.C.C.

## 2013-07-13 NOTE — Discharge Summary (Signed)
Physician Discharge Summary  Theodore [name] HYI:[id number] DOB: [birth year] DOA: 07/08/2013  PCP: Tula Nakayama, MD  Admit date: 07/08/2013 Discharge date: 07/13/2013  Time spent: 35 minutes  Recommendations for Outpatient Follow-up:  1. Needs to follow up with Primary cardiologist to consider resume coumadin and adjust lasix.  2. Needs B-met to follow up renal function.   Discharge Diagnoses:     Epistaxis, recurrent   Acute blood loss anemia   ARF (acute renal failure)   HYPERTENSION   Atrial fibrillation   Chronic Diastolic failure.      Discharge Condition: stable.   Diet recommendation: Heart Healthy  Filed Weights   07/11/13 1442 07/12/13 0532 07/13/13 0453  Weight: 67.9 kg (149 lb 11.1 oz) 67.586 kg (149 lb) 67.767 kg (149 lb 6.4 oz)    History of present illness:  Theodore [name] is a 73 y.o. male  This is a 73 year old man who has a history of diastolic congestive heart failure and also atrial fibrillation for which he is on anticoagulation with warfarin now presents with approximately one week history of recurrent episodes of epistaxis. He has been to the emergency room several times in the last week and has had nasal packing on the right nostril. At one point a couple of days ago his INR was 6.0 and warfarin was discontinued and he was given one dose of oral vitamin K. He was observed in the ER for an hour following placement of a rapid Rhino with no recurrence of bleeding and then discharged home. He returns once again with bleeding. On this occasion he is found to have a reduced hemoglobin and creatinine has increased. He is now being admitted with a view to consultation by ENT at [facility] Course:  1-Acute blood loss anemia secondary to epistaxis. Hb at 8.4 from 10 last week. Continue to follow trend. Hb decrease to 7. Received 1 unit PRBC. Hb today at 8.5 no further bleeding.   2-Recurrent epistaxis; INR at 1.2. Continue to hold coumadin.  [facility] from [facility] for [facility] evaluation. I have consulted Dr Wilburn Cornelia. I spoke with Dr Wilburn Cornelia, he recommend IR evaluation for Rt post nasal embolization for long term management of these chronic recurrent problems. 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 Doing well post procedure.  Right nasal packing removed.  Ok to discontinue antibiotics per ENT.  Resolved.   3-Acute renal failure, likely secondary to poor by mouth intake and dehydration. Renal function improved. Cr at 1.07. Continue with torsemide, lasix. Cr stable at 1.   4-Chronic atrial fibrillation, SVT: previously on Coumadin, this has been discontinued and his INR today is 1.22. Continue with Cardizem and metoprolol. Needs to follow up with primary cardiology in 1 week, consider resuming coumdain if no further bleeding.   5-History of diastolic congestive heart failure, currently compensated. Continue with torsemide, lasix. Cardio following.  6-Constipation: Continue with Miralax.  7-leukocytosis; no fever. WBC stable at 12. 8-Diarrhea; stop antibiotics. Resolved.    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  IR Cardiology  Discharge Exam: Filed Vitals:   07/13/13 0453  BP: 110/74  Pulse: 64  Temp: 97.9 F (36.6 C)  Resp: 18    General: No distress.  Cardiovascular: S 1, S 2 RRR Respiratory:  fine crackles bases.   Discharge Instructions You were cared for by a hospitalist during your hospital stay. If you have any questions about your discharge medications or the care you received while you were in the hospital after you are discharged, you can call the unit and asked to speak with the hospitalist on call  if the hospitalist that took care of you is not available. Once you are discharged, your primary care physician will handle any further medical issues. Please note that NO REFILLS for any discharge medications will be authorized once you are discharged, as it is imperative that you return to your primary care physician (or establish a relationship with a primary care physician if you do not have one) for your aftercare needs so that they can reassess your need for medications and monitor your lab values.  Discharge Orders   Future Appointments Provider Department Dept Phone   08/07/2013 10:20 AM Herminio Commons, MD [facility] [place] [phone]   09/29/2013 10:00 AM Fayrene Helper, MD [facility] [phone]   Future Orders Complete By Expires   Diet - low sodium heart healthy  As directed    Increase activity slowly  As directed        Medication List    STOP taking these medications       sulfamethoxazole-trimethoprim 800-160 MG per tablet  Commonly known as:  BACTRIM DS     warfarin 4 MG tablet  Commonly known as:  COUMADIN      TAKE these medications       albuterol (2.5 MG/3ML) 0.083% nebulizer solution  Commonly known as:  PROVENTIL  Take 2.5 mg by nebulization 2 (two) times daily.     albuterol 108 (90 BASE) MCG/ACT inhaler  Commonly known as:  PROAIR HFA  Inhale 2 puffs into the lungs every 6 (six) hours as needed for wheezing or shortness of breath.     allopurinol 300 MG tablet  Commonly known as:  ZYLOPRIM  Take 1 tablet (300 mg total) by mouth daily.     ALPRAZolam 0.25 MG tablet  Commonly known as:  XANAX  Take 0.25 mg by mouth 2 (two) times daily as needed for anxiety.     brimonidine-timolol 0.2-0.5 % ophthalmic solution  Commonly known as:  COMBIGAN  Place 1 drop into both eyes every 12 (twelve) hours.     budesonide-formoterol 160-4.5 MCG/ACT inhaler  Commonly known as:  SYMBICORT  Inhale 2 puffs into the lungs 2 (two) times  daily.     diltiazem 180 MG 24 hr capsule  Commonly known as:  CARDIZEM CD  Take 180 mg by mouth daily. *Patient states that as of 07/08/2013 he was advised to STOP this medication     ferrous sulfate 325 (65 FE) MG tablet  Take 325 mg by mouth 2 (two) times daily.     furosemide 40 MG tablet  Commonly known as:  LASIX  Take 80 mg by mouth daily.     hydrALAZINE 25 MG tablet  Commonly known as:  APRESOLINE  Take 1 tablet (25 mg total) by mouth 3 (three) times daily.     HYDROcodone-acetaminophen 5-325 MG per tablet  Commonly known as:  NORCO/VICODIN  Take 2 tablets by mouth every 4 (four) hours as needed.     metoprolol 50 MG tablet  Commonly known as:  LOPRESSOR  Take 1 tablet (50 mg total) by mouth 2 (two) times daily.     mirtazapine 7.5 MG tablet  Commonly known as:  REMERON  Take 1 tablet (7.5 mg total) by mouth at bedtime.     montelukast 10 MG tablet  Commonly known as:  SINGULAIR  Take 10 mg by mouth at bedtime.     potassium chloride 10 MEQ tablet  Commonly known as:  K-DUR  Take 10 mEq by mouth 2 (two) times daily.     temazepam 15 MG capsule  Commonly known as:  RESTORIL  Take 15 mg by mouth at bedtime.     tiotropium 18 MCG inhalation capsule  Commonly known as:  SPIRIVA  Place 18 mcg into inhaler and inhale daily.     torsemide 20 MG tablet  Commonly known as:  DEMADEX  Take 2 tablets (40 mg total) by mouth daily.     traMADol 50 MG tablet  Commonly known as:  ULTRAM  Take 1 tablet (50 mg total) by mouth daily as needed.     travoprost (benzalkonium) 0.004 % ophthalmic solution  Commonly known as:  TRAVATAN  Place 1 drop into both eyes at bedtime.       Allergies  Allergen Reactions  . Aspirin Other (See Comments)    On coumadin       Follow-up Information   Follow up with Kate Sable A, MD In 1 week.   Specialty:  Cardiology   Contact information:   [address] [place] [address] [phone]        The results  of significant diagnostics from this hospitalization (including imaging, microbiology, ancillary and laboratory) are listed below for reference.    Significant Diagnostic Studies: Dg Chest 2 View  07/08/2013   CLINICAL DATA:  Shortness of breath, epistaxis  EXAM: CHEST  2 VIEW  COMPARISON:  06/22/2013  FINDINGS: Lungs are essentially clear. No focal consolidation. No pleural effusion or pneumothorax.  Cardiomegaly.  Visualized osseous structures are within normal limits.  IMPRESSION: No evidence of acute cardiopulmonary disease.   Electronically Signed   By: Julian Hy M.D.   On: 07/08/2013 13:31   Dg Chest 2 View  06/16/2013   CLINICAL DATA:  Tachycardia.  Shortness of breath.  Chest pain.  EXAM: CHEST  2 VIEW  COMPARISON:  DG CHEST 2 VIEW dated 06/13/2013  FINDINGS: Mediastinum normal. Cardiomegaly with mild pulmonary vascular prominence noted. No pleural effusion. No pulmonary infiltrate. No pneumothorax. No acute osseus abnormality. Degenerative changes thoracic spine.  IMPRESSION: Stable cardiomegaly and pulmonary venous congestion. No evidence of overt pulmonary edema. Exam stable from prior study.   Electronically Signed   By: Marcello Moores  Register   On: 06/16/2013 16:59   Dg Chest 2 View  06/13/2013   CLINICAL DATA:  Bilateral leg swelling and shortness of breath for 1 week.  EXAM: CHEST  2 VIEW  COMPARISON:  03/17/2013  FINDINGS: The cardiac silhouette remains enlarged, unchanged. The lungs remain hyperinflated. There is mild cephalization of pulmonary blood flow, unchanged. No overt pulmonary edema is seen. No pleural effusion or pneumothorax is identified. There is no airspace consolidation. No acute osseous abnormality is identified.  IMPRESSION: Cardiomegaly and mild pulmonary vascular congestion without overt edema.   Electronically Signed   By: Logan Bores   On: 06/13/2013 11:34   Dg Chest Port 1 View  06/22/2013   CLINICAL DATA:  Follow-up chest radiograph  EXAM: PORTABLE CHEST - 1 VIEW   COMPARISON:  DG CHEST 2 VIEW dated 06/16/2013; DG CHEST 2 VIEW dated 03/17/2013; DG CHEST 1 VIEW dated 05/18/2012  FINDINGS: Grossly  unchanged enlarged cardiac silhouette and mediastinal contours given slightly reduced lung volume. Mild pulmonary venous congestion without frank evidence of edema. Slight worsening of bilateral medial basilar heterogeneous opacities. No definite pleural effusion or pneumothorax. Grossly unchanged bones.  IMPRESSION: 1. No definite acute cardiopulmonary disease on this hypoventilated [facility] portable examination. Further evaluation with a PA and lateral chest radiograph may be obtained as clinically indicated. 2. Mild pulmonary venous congestion and worsening bibasilar atelectasis.   Electronically Signed   By: Sandi Mariscal M.D.   On: 06/22/2013 09:19    Microbiology: No results found for this or any previous visit (from the past 240 hour(s)).   Labs: Basic Metabolic Panel:  Recent Labs Lab 07/08/13 1334 07/09/13 0555 07/09/13 0830 07/10/13 0141 07/11/13 0317 07/12/13 0500  NA 142  --  141 138 140 140  K 4.3  --  4.0 3.9 4.0 3.5*  CL 96  --  98 98 103 102  CO2 34*  --  _0 GLUCOSE 86  --  93 99 89 104*  BUN 28*  --  31* 28* 20 22  CREATININE 1.71*  --  1.74* 1.45* 1.07 1.07  CALCIUM 9.6  --  8.5 8.6 7.7* 8.5  MG  --  1.9  --   --   --   --    Liver Function Tests:  Recent Labs Lab 07/08/13 1334 07/09/13 0830  AST 28 30  ALT 14 13  ALKPHOS 98 78  BILITOT 0.5 0.4  PROT 7.5 6.3  ALBUMIN 3.4* 2.9*   No results found for this basename: LIPASE, AMYLASE,  in the last 168 hours No results found for this basename: AMMONIA,  in the last 168 hours CBC:  Recent Labs Lab 07/06/13 1901 07/08/13 1334 07/09/13 0830 07/10/13 0141 07/11/13 0317 07/12/13 0500  WBC 11.1* 11.4* 10.5 10.7* 11.7* 13.2*  NEUTROABS 9.1* 9.1*  --   --  9.1*  --   HGB 8.9* 8.4* 7.1* 8.6* 8.0* 8.5*  HCT 29.2* 27.8* 23.3* 27.6* 26.0* 27.5*  MCV 89.6 89.4 88.6 88.5 90.3 89.3   PLT 294 299 237 211 174 188   Cardiac Enzymes:  Recent Labs Lab 07/10/13 0141 07/10/13 0759  TROPONINI <0.30 <0.30   BNP: BNP (last 3 results)  Recent Labs  06/16/13 1554 06/17/13 0537 07/09/13 0555  PROBNP 3560.0* 3222.0* 3842.0*   CBG: No results found for this basename: GLUCAP,  in the last 168 hours     Signed:  Annya Lizana A Ashlye Oviedo  [facility] 07/13/2013, 8:43 AM

## 2013-07-13 NOTE — Progress Notes (Signed)
Discharge order received. Patient for discharge home today. All IV's d/c with tips intact and dressings clean and dry. D/C instructions given, with verbalized understanding. Patient calm with no distress. Staff brought patient downstairs via wheelchair.

## 2013-07-14 ENCOUNTER — Encounter: Payer: Medicare Other | Admitting: Cardiovascular Disease

## 2013-07-15 ENCOUNTER — Telehealth: Payer: Self-pay | Admitting: Cardiology

## 2013-07-15 ENCOUNTER — Telehealth: Payer: Self-pay

## 2013-07-15 ENCOUNTER — Encounter: Payer: Medicare Other | Admitting: Cardiovascular Disease

## 2013-07-15 DIAGNOSIS — D62 Acute posthemorrhagic anemia: Secondary | ICD-10-CM

## 2013-07-15 DIAGNOSIS — I10 Essential (primary) hypertension: Secondary | ICD-10-CM

## 2013-07-15 NOTE — Telephone Encounter (Signed)
No INR needed, only chem 7 and cbc

## 2013-07-15 NOTE — Telephone Encounter (Signed)
Seen by [facility], called pcp and drew cbc,bmet,INR because pt was sob and feels weak.We moved fu apt form 5/28 to 5/19 t 120pm

## 2013-07-15 NOTE — Addendum Note (Signed)
Addended by: Denman George B on: 07/15/2013 04:48 PM   Modules accepted: Orders

## 2013-07-15 NOTE — Telephone Encounter (Signed)
Inr, cbc, and bmp per clarification verbally from Dr. Moshe Cipro  Order faxed to lab as well as to Janalyn Shy

## 2013-07-15 NOTE — Telephone Encounter (Signed)
Please call at above number regarding patient and BMET order. / tgs

## 2013-07-17 ENCOUNTER — Ambulatory Visit (HOSPITAL_COMMUNITY)
Admission: RE | Admit: 2013-07-17 | Discharge: 2013-07-17 | Disposition: A | Discharge status: Home / Self Care | Payer: Medicare Other | Source: Ambulatory Visit | Attending: Family Medicine | Admitting: Family Medicine

## 2013-07-17 ENCOUNTER — Encounter: Payer: Self-pay | Admitting: Family Medicine

## 2013-07-17 ENCOUNTER — Encounter (INDEPENDENT_AMBULATORY_CARE_PROVIDER_SITE_OTHER): Payer: Self-pay

## 2013-07-17 ENCOUNTER — Ambulatory Visit (INDEPENDENT_AMBULATORY_CARE_PROVIDER_SITE_OTHER): Payer: Medicare Other | Admitting: Family Medicine

## 2013-07-17 VITALS — BP 146/82 | HR 142 | Resp 22 | Wt 149.0 lb

## 2013-07-17 DIAGNOSIS — R040 Epistaxis: Secondary | ICD-10-CM

## 2013-07-17 DIAGNOSIS — R918 Other nonspecific abnormal finding of lung field: Secondary | ICD-10-CM | POA: Insufficient documentation

## 2013-07-17 DIAGNOSIS — I509 Heart failure, unspecified: Secondary | ICD-10-CM

## 2013-07-17 DIAGNOSIS — F341 Dysthymic disorder: Secondary | ICD-10-CM

## 2013-07-17 DIAGNOSIS — J449 Chronic obstructive pulmonary disease, unspecified: Secondary | ICD-10-CM

## 2013-07-17 DIAGNOSIS — I10 Essential (primary) hypertension: Secondary | ICD-10-CM

## 2013-07-17 DIAGNOSIS — R0602 Shortness of breath: Secondary | ICD-10-CM | POA: Insufficient documentation

## 2013-07-17 DIAGNOSIS — I4891 Unspecified atrial fibrillation: Secondary | ICD-10-CM

## 2013-07-17 DIAGNOSIS — I5033 Acute on chronic diastolic (congestive) heart failure: Secondary | ICD-10-CM

## 2013-07-17 DIAGNOSIS — J9819 Other pulmonary collapse: Secondary | ICD-10-CM | POA: Insufficient documentation

## 2013-07-17 DIAGNOSIS — F418 Other specified anxiety disorders: Secondary | ICD-10-CM

## 2013-07-17 DIAGNOSIS — Z87891 Personal history of nicotine dependence: Secondary | ICD-10-CM | POA: Insufficient documentation

## 2013-07-17 NOTE — Progress Notes (Signed)
   Subjective:    Patient ID: Theodore [name], male    DOB: 03-01-1941, 73 y.o.   MRN: [id number]  HPI 3 day h/o increased exertional dyspnea, no fever , sputum white, No nasal drainage or chills. Sleep is fair and pt states his depression and anxiety are improved  On his current meidcation. Rept labs since visit show improved hB , he has no further epispode of epistaxis , currently off coumadin pending cardiology eval   Review of Systems See HPI Denies recent fever or chills.c/o increased exertional fatigue Denies sinus pressure, nasal congestion, ear pain or sore throat. Denies chest congestion, chronic   cough and  wheezing. Denies chest pains, chronic  Palpitations, orthopnea , uses 2 pillows, denies PND  and leg swelling Denies abdominal pain, nausea, vomiting,diarrhea or constipation.   Denies dysuria, frequency, hesitancy or incontinence. Denies joint pain, swelling and limitation in mobility. Denies headaches, seizures, numbness, or tingling. Denies uncontrolled depression, anxiety or insomnia. Denies skin break down or rash.        Objective:   Physical Exam BP 146/82  Pulse 142  Resp 22  Wt 149 lb (67.586 kg)  SpO2 92% Patient alert and oriented and in no cardiopulmonary distress.  HEENT: No facial asymmetry, EOMI, no sinus tenderness,  oropharynx pink and moist.  Neck supple no adenopathy.  Chest: Markedly reduced air entry and bibasilar crackles , high pitched wheezes throughout  CVS: S1, S2 , no S3.Irregularly irregular heart rate  ABD: Soft non tender. Bowel sounds normal.  Ext: No edema  MS: Adequate ROM spine, shoulders, hips and knees.  Skin: Intact, no ulcerations or rash noted.  Psych: Good eye contact, normal affect. Memory intact nildly anxious not depressed appearing.  CNS: CN 2-12 intact, power, tone and sensation normal throughout.        Assessment & Plan:  Epistaxis S/p recent hospitalization for recurrent severe epistaxis ,  hopefully recent vascular intervention will negate recurrence. Cardiology also to re evaluate risk benefit ratio as far as chronic coumadin is concerned.Currently off coumadin  COPD (chronic obstructive pulmonary disease) Severe, requires nocturnal oxygen and really daily oxygen  Tobacco cessation counseling done  Depression with anxiety Improved continue medication as before  HYPERTENSION Not at goal, medications to be reviewed at home for accuracy  Atrial fibrillation Chronic ,  Not rate controlled, increased stroke risk  CHF (congestive heart failure) Symptoms and signs suggestive of decompensation CXr today at hospital, disposition will depend on  report

## 2013-07-17 NOTE — Patient Instructions (Signed)
F/U in 6 weeks, call if you need me before  You need a CXR today, WAIT in the xray department until you are advised whether it is safe for you to go home  I am concerned that fluid is building back up in your lungs

## 2013-07-18 ENCOUNTER — Other Ambulatory Visit: Payer: Self-pay | Admitting: Family Medicine

## 2013-07-18 ENCOUNTER — Encounter: Payer: Self-pay | Admitting: Cardiovascular Disease

## 2013-07-20 NOTE — Assessment & Plan Note (Signed)
Improved continue medication as before

## 2013-07-20 NOTE — Assessment & Plan Note (Signed)
Severe, requires nocturnal oxygen and really daily oxygen  Tobacco cessation counseling done

## 2013-07-20 NOTE — Assessment & Plan Note (Signed)
Chronic ,  Not rate controlled, increased stroke risk

## 2013-07-20 NOTE — Assessment & Plan Note (Signed)
S/p recent hospitalization for recurrent severe epistaxis , hopefully recent vascular intervention will negate recurrence. Cardiology also to re evaluate risk benefit ratio as far as chronic coumadin is concerned.Currently off coumadin

## 2013-07-20 NOTE — Assessment & Plan Note (Signed)
Symptoms and signs suggestive of decompensation CXr today at hospital, disposition will depend on  report

## 2013-07-20 NOTE — Assessment & Plan Note (Signed)
Not at goal, medications to be reviewed at home for accuracy

## 2013-07-22 ENCOUNTER — Ambulatory Visit: Payer: Medicare Other | Admitting: Family Medicine

## 2013-07-29 ENCOUNTER — Ambulatory Visit (INDEPENDENT_AMBULATORY_CARE_PROVIDER_SITE_OTHER): Payer: Medicare Other | Admitting: Cardiovascular Disease

## 2013-07-29 ENCOUNTER — Encounter: Payer: Self-pay | Admitting: Cardiovascular Disease

## 2013-07-29 VITALS — BP 142/82 | HR 58 | Ht 66.0 in | Wt 144.0 lb

## 2013-07-29 DIAGNOSIS — I272 Pulmonary hypertension, unspecified: Secondary | ICD-10-CM

## 2013-07-29 DIAGNOSIS — I5032 Chronic diastolic (congestive) heart failure: Secondary | ICD-10-CM

## 2013-07-29 DIAGNOSIS — I2789 Other specified pulmonary heart diseases: Secondary | ICD-10-CM

## 2013-07-29 DIAGNOSIS — I472 Ventricular tachycardia: Secondary | ICD-10-CM

## 2013-07-29 DIAGNOSIS — Z7901 Long term (current) use of anticoagulants: Secondary | ICD-10-CM

## 2013-07-29 DIAGNOSIS — I4891 Unspecified atrial fibrillation: Secondary | ICD-10-CM

## 2013-07-29 DIAGNOSIS — Z9289 Personal history of other medical treatment: Secondary | ICD-10-CM

## 2013-07-29 DIAGNOSIS — I4729 Other ventricular tachycardia: Secondary | ICD-10-CM

## 2013-07-29 DIAGNOSIS — Z5181 Encounter for therapeutic drug level monitoring: Secondary | ICD-10-CM

## 2013-07-29 DIAGNOSIS — I10 Essential (primary) hypertension: Secondary | ICD-10-CM

## 2013-07-29 DIAGNOSIS — J449 Chronic obstructive pulmonary disease, unspecified: Secondary | ICD-10-CM

## 2013-07-29 DIAGNOSIS — Z87898 Personal history of other specified conditions: Secondary | ICD-10-CM

## 2013-07-29 DIAGNOSIS — R040 Epistaxis: Secondary | ICD-10-CM

## 2013-07-29 NOTE — Patient Instructions (Addendum)
Your physician recommends that you schedule a follow-up appointment in: Monday, August 31st at 1 pm with Dr.Koneswaran   Your lexiscan is scheduled for Tuesday May 26 th at 9:30 am register in out-patient radiology please HOLD your Diltiazem and Metoprolol the morning of your test    Your physician recommends that you continue on your current medications as directed. Please refer to the Current Medication list given to you today.Please make sure you are taking Diltiazem CD 180 mg daily, Metoprolol 50 mg twice a day and Hydralazine 25 mg three times a day  Janalyn Shy will sit down with you and go over what medicines you should be taking and she will call and tell us.We will have your social worker check your heart rate and blood pressure in 2 weeks and they will call us  Per Dr.Koneswaran. You may restart Coumadin . I will message Edrick Oh RN [facility] RN  and she will call you    Thank you for choosing [facility] !

## 2013-07-29 NOTE — Progress Notes (Deleted)
Name: Theodore [name]    DOB: 1940/07/21  Age: 73 y.o.  MR#: [id number]       PCP:  Tula Nakayama, MD      Insurance: Payor: Onnie Boer MEDICARE / Plan: AARP MEDICARE COMPLETE / Product Type: *No Product type* /   CC:   No chief complaint on file.   VS Filed Vitals:   07/29/13 1320  BP: 142/82  Pulse: 58  Height: 5\' 6"  (1.676 m)  Weight: 144 lb (65.318 kg)    Weights Current Weight  07/29/13 144 lb (65.318 kg)  07/17/13 149 lb (67.586 kg)  07/13/13 149 lb 6.4 oz (67.767 kg)    Blood Pressure  BP Readings from Last 3 Encounters:  07/29/13 142/82  07/17/13 146/82  07/13/13 113/68     Admit date:  (Not on file) Last encounter with RMR:  07/01/2013   Allergy Aspirin  Current Outpatient Prescriptions  Medication Sig Dispense Refill  . albuterol (PROAIR HFA) 108 (90 BASE) MCG/ACT inhaler Inhale 2 puffs into the lungs every 6 (six) hours as needed for wheezing or shortness of breath.  6.7 g  4  . albuterol (PROVENTIL) (2.5 MG/3ML) 0.083% nebulizer solution INHALE 1 VIAL VIA NEBULIZER EVERY 6 HOURS AS NEEDED  375 mL  0  . allopurinol (ZYLOPRIM) 300 MG tablet Take 1 tablet (300 mg total) by mouth daily.  30 tablet  6  . ALPRAZolam (XANAX) 0.25 MG tablet TAKE 1 TABLET BY MOUTH TWICE DAILY AS NEEDED  28 tablet  0  . brimonidine-timolol (COMBIGAN) 0.2-0.5 % ophthalmic solution Place 1 drop into both eyes every 12 (twelve) hours.  5 mL  0  . budesonide-formoterol (SYMBICORT) 160-4.5 MCG/ACT inhaler Inhale 2 puffs into the lungs 2 (two) times daily.  1 Inhaler  4  . ferrous sulfate 325 (65 FE) MG tablet Take 325 mg by mouth 2 (two) times daily.      . montelukast (SINGULAIR) 10 MG tablet Take 10 mg by mouth at bedtime.      . potassium chloride (K-DUR) 10 MEQ tablet Take 10 mEq by mouth 2 (two) times daily.      Marland Kitchen SPIRIVA HANDIHALER 18 MCG inhalation capsule INHALE THE CONTENTS OF 1 CAPSULE VIA HANDIHALER EVERY DAY  30 capsule  0  . temazepam (RESTORIL) 15 MG capsule TAKE 1  CAPSULE BY MOUTH EVERY DAY AT BEDTIME AS NEEDED FOR SLEEP  30 capsule  0  . torsemide (DEMADEX) 20 MG tablet Take 2 tablets (40 mg total) by mouth daily.  60 tablet  0  . travoprost, benzalkonium, (TRAVATAN Z) 0.004 % ophthalmic solution Place 1 drop into both eyes at bedtime.        No current facility-administered medications for this visit.    Discontinued Meds:    Medications Discontinued During This Encounter  Medication Reason  . HYDROcodone-acetaminophen (NORCO/VICODIN) 5-325 MG per tablet Error  . diltiazem (CARDIZEM CD) 180 MG 24 hr capsule Error    Patient Active Problem List   Diagnosis Date Noted  . Epistaxis, recurrent 07/08/2013  . ARF (acute renal failure) 07/08/2013  . Encounter for therapeutic drug monitoring 07/03/2013  . NSVT (nonsustained ventricular tachycardia) 07/01/2013  . COPD with acute exacerbation 06/24/2013  . Leukocytosis, unspecified 06/22/2013  . Normocytic anemia 06/18/2013  . Acute on chronic diastolic heart failure 28/41/3244  . Pulmonary arterial hypertension 06/17/2013  . Acute respiratory failure with hypoxia 06/17/2013  . CHF (congestive heart failure) 06/16/2013  . Routine general medical examination at a health  care facility 05/25/2013  . Hyperuricemia 05/23/2013  . Depression with anxiety 05/21/2013  . Insomnia secondary to anxiety 04/14/2013  . Glaucoma 04/14/2013  . COPD (chronic obstructive pulmonary disease) 04/02/2013  . Epistaxis 04/01/2013  . Acute blood loss anemia 04/01/2013  . 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 Tobacco abuse disorder 12/24/2010  . Encounter for long-term (current) use of other medications 05/15/2010  . HYPERTENSION 02/13/2006  . Atrial fibrillation 02/13/2006    LABS    Component Value Date/Time   NA 141 07/13/2013 0848   NA 140 07/12/2013 0500   NA 140 07/11/2013 0317   K 4.7 07/13/2013 0848   K 3.5* 07/12/2013 0500   K 4.0 07/11/2013 0317   CL 101 07/13/2013  0848   CL 102 07/12/2013 0500   CL 103 07/11/2013 0317   CO2 28 07/13/2013 0848   CO2 27 07/12/2013 0500   CO2 24 07/11/2013 0317   GLUCOSE 115* 07/13/2013 0848   GLUCOSE 104* 07/12/2013 0500   GLUCOSE 89 07/11/2013 0317   BUN 22 07/13/2013 0848   BUN 22 07/12/2013 0500   BUN 20 07/11/2013 0317   CREATININE 1.23 07/13/2013 0848   CREATININE 1.07 07/12/2013 0500   CREATININE 1.07 07/11/2013 0317   CREATININE 1.28 05/27/2013 1644   CREATININE 1.06 05/21/2013 1614   CREATININE 0.84 10/07/2012 1720   CALCIUM 8.7 07/13/2013 0848   CALCIUM 8.5 07/12/2013 0500   CALCIUM 7.7* 07/11/2013 0317   GFRNONAA 56* 07/13/2013 0848   GFRNONAA 67* 07/12/2013 0500   GFRNONAA 67* 07/11/2013 0317   GFRAA 65* 07/13/2013 0848   GFRAA 77* 07/12/2013 0500   GFRAA 77* 07/11/2013 0317   CMP     Component Value Date/Time   NA 141 07/13/2013 0848   K 4.7 07/13/2013 0848   CL 101 07/13/2013 0848   CO2 28 07/13/2013 0848   GLUCOSE 115* 07/13/2013 0848   BUN 22 07/13/2013 0848   CREATININE 1.23 07/13/2013 0848   CREATININE 1.28 05/27/2013 1644   CALCIUM 8.7 07/13/2013 0848   PROT 6.3 07/09/2013 0830   ALBUMIN 2.9* 07/09/2013 0830   AST 30 07/09/2013 0830   ALT 13 07/09/2013 0830   ALKPHOS 78 07/09/2013 0830   BILITOT 0.4 07/09/2013 0830   GFRNONAA 56* 07/13/2013 0848   GFRAA 65* 07/13/2013 0848       Component Value Date/Time   WBC 12.5* 07/13/2013 0848   WBC 13.2* 07/12/2013 0500   WBC 11.7* 07/11/2013 0317   HGB 8.4* 07/13/2013 0848   HGB 8.5* 07/12/2013 0500   HGB 8.0* 07/11/2013 0317   HCT 27.1* 07/13/2013 0848   HCT 27.5* 07/12/2013 0500   HCT 26.0* 07/11/2013 0317   MCV 89.7 07/13/2013 0848   MCV 89.3 07/12/2013 0500   MCV 90.3 07/11/2013 0317    Lipid Panel     Component Value Date/Time   CHOL  Value: 123        ATP III CLASSIFICATION:  <200     mg/dL   Desirable  200-239  mg/dL   Borderline High  >=240    mg/dL   High        12/19/2009 0620   TRIG 68 12/19/2009 0620   HDL 38* 12/19/2009 0620   CHOLHDL 3.2 12/19/2009 0620   VLDL 14 12/19/2009 0620   LDLCALC  Value: 71         Total Cholesterol/HDL:CHD Risk Coronary Heart Disease Risk Table  Men   Women  1/2 Average Risk   3.4   3.3  Average Risk       5.0   4.4  2 X Average Risk   9.6   7.1  3 X Average Risk  23.4   11.0        Use the calculated Patient Ratio above and the CHD Risk Table to determine the patient's CHD Risk.        ATP III CLASSIFICATION (LDL):  <100     mg/dL   Optimal  100-129  mg/dL   Near or Above                    Optimal  130-159  mg/dL   Borderline  160-189  mg/dL   High  >190     mg/dL   Very High 12/19/2009 0620    ABG    Component Value Date/Time   PHART 7.415 05/15/2012 0145   PCO2ART 46.9* 05/15/2012 0145   PO2ART 104.0* 05/15/2012 0145   HCO3 29.4* 05/15/2012 0145   TCO2 26.8 05/15/2012 0145   O2SAT 98.9 05/15/2012 0145     Lab Results  Component Value Date   TSH 2.580 06/18/2013   BNP (last 3 results)  Recent Labs  06/16/13 1554 06/17/13 0537 07/09/13 0555  PROBNP 3560.0* 3222.0* 3842.0*   Cardiac Panel (last 3 results) No results found for this basename: CKTOTAL, CKMB, TROPONINI, RELINDX,  in the last 72 hours  Iron/TIBC/Ferritin    Component Value Date/Time   IRON 33* 06/18/2013 0528   TIBC 354 06/18/2013 0528   FERRITIN 60 06/18/2013 0528     EKG Orders placed during the hospital encounter of 07/08/13  . EKG 12-LEAD  . EKG 12-LEAD     Prior Assessment and Plan Problem List as of 07/29/2013     Cardiovascular and Mediastinum   HYPERTENSION   Last Assessment & Plan   07/17/2013 Office Visit Written 07/20/2013 11:05 PM by Fayrene Helper, MD     Not at goal, medications to be reviewed at home for accuracy    Atrial fibrillation   Last Assessment & Plan   07/17/2013 Office Visit Written 07/20/2013 11:06 PM by Fayrene Helper, MD     Chronic ,  Not rate controlled, increased stroke risk    Diastolic CHF with preserved left ventricular function, NYHA class 4   Last Assessment & Plan   05/21/2013 Office Visit Written 05/25/2013  5:19 PM by Fayrene Helper, MD     3 plus bilateral pitting edema, pt has not been taking meds as prescribed, importance of same stressed and message also to be relayed by the nurse to case managerfor follow up at home    CHF (congestive heart failure)   Last Assessment & Plan   07/17/2013 Office Visit Written 07/20/2013 11:07 PM by Fayrene Helper, MD     Symptoms and signs suggestive of decompensation CXr today at hospital, disposition will depend on  report    Acute on chronic diastolic heart failure   Pulmonary arterial hypertension   NSVT (nonsustained ventricular tachycardia)     Respiratory   Epistaxis   Last Assessment & Plan   07/17/2013 Office Visit Written 07/20/2013 11:04 PM by Fayrene Helper, MD     S/p recent hospitalization for recurrent severe epistaxis , hopefully recent vascular intervention will negate recurrence. Cardiology also to re evaluate risk benefit ratio as far as chronic coumadin is concerned.Currently off coumadin  COPD (chronic obstructive pulmonary disease)   Last Assessment & Plan   07/17/2013 Office Visit Written 07/20/2013 11:04 PM by Fayrene Helper, MD     Severe, requires nocturnal oxygen and really daily oxygen  Tobacco cessation counseling done    Acute respiratory failure with hypoxia   COPD with acute exacerbation   Epistaxis, recurrent     Genitourinary   ARF (acute renal failure)     Other   Tobacco abuse disorder   Last Assessment & Plan   07/03/2013 Office Visit Written 07/06/2013  2:42 AM by Fayrene Helper, MD     Continues to smoke, unwilling to set a quit date though states he is cutting back and knows he needs to quit  Patient counseled for approximately 5 minutes regarding the health risks of ongoing nicotine use, specifically all types of cancer, heart disease, stroke and respiratory failure. The options available for help with cessation ,the behavioral changes to assist the process, and the option to either gradully reduce usage  Or abruptly  stop.is also discussed. Pt is also encouraged to set specific goals in number of cigarettes used daily, as well as to set a quit date.     Encounter for long-term (current) use of other medications   Acute blood loss anemia   Insomnia secondary to anxiety   Last Assessment & Plan   04/07/2013 Office Visit Written 04/14/2013  8:33 PM by Fayrene Helper, MD     Controlled, no change in medication     Glaucoma   Last Assessment & Plan   07/03/2013 Office Visit Written 07/06/2013  2:40 AM by Fayrene Helper, MD     Upcoming appt with opthalmologist scheduled    Depression with anxiety   Last Assessment & Plan   07/17/2013 Office Visit Written 07/20/2013 11:05 PM by Fayrene Helper, MD     Improved continue medication as before    Hyperuricemia   Last Assessment & Plan   05/21/2013 Office Visit Written 05/25/2013  5:21 PM by Fayrene Helper, MD     Needs allopurinol top lower level and reduce risk of acute gout flare    Routine general medical examination at a health care facility   Last [place]   05/21/2013 Office Visit Written 05/25/2013  5:18 PM by Fayrene Helper, MD     Annual exam as documented. Counseling done  re healthy lifestyle involving commitment to 150 minutes exercise per week, heart healthy diet, and attaining healthy weight.The importance of adequate sleep also discussed. Regular seat belt use and safe storage  of firearms if patient has them, is also discussed. Changes in health habits are decided on by the patient with goals and time frames  set for achieving them. Immunization and cancer screening needs are specifically addressed at this visit. Fall prevention al;so discussed    Normocytic anemia   Leukocytosis, unspecified   Encounter for therapeutic drug monitoring       Imaging: Dg Chest 2 View  07/17/2013   CLINICAL DATA:  Shortness of breath for 1 month. History of smoking.  EXAM: CHEST  2 VIEW  COMPARISON:  07/08/2013 and chest CT  03/18/2011  FINDINGS: Two views of the chest demonstrate increased aeration in the retrosternal space and findings are consistent with emphysema. Heart size is stable and upper limits of normal. The trachea is midline. There is no focal airspace disease. No acute bone abnormality. Focal linear densities at left lung base could represent overlying  structures or atelectasis.  IMPRESSION: Emphysematous changes with left basilar atelectasis.   Electronically Signed   By: Markus Daft M.D.   On: 07/17/2013 15:05   Dg Chest 2 View  07/08/2013   CLINICAL DATA:  Shortness of breath, epistaxis  EXAM: CHEST  2 VIEW  COMPARISON:  06/22/2013  FINDINGS: Lungs are essentially clear. No focal consolidation. No pleural effusion or pneumothorax.  Cardiomegaly.  Visualized osseous structures are within normal limits.  IMPRESSION: No evidence of acute cardiopulmonary disease.   Electronically Signed   By: Julian Hy M.D.   On: 07/08/2013 13:31   Ir Transcath/emboliz  07/17/2013   CLINICAL DATA:  Recurrent epistaxis.  EXAM: BILATERAL COMMON CAROTID AND INNOMINATE ANGIOGRAPHY, AND BILATERAL EXTERNAL CAROTID ARTERY ARTERIOGRAM FOLLOWED BY ENDOVASCULAR SUPERSELECTIVE EMBOLIZATION OF EXTERNAL CAROTID ARTERY BRANCHES FOR RECURRENT PERSISTENT EPISTAXIS:  ANESTHESIA/SEDATION: General anesthesia.  MEDICATIONS: As per general anesthesia.  CONTRAST:  121mL OMNIPAQUE IOHEXOL 300 MG/ML SOLN  PROCEDURE: Following A full explanation of the procedure along with the potential associated complications, an informed witnessed consent was obtained. The patient was put under general anesthesia by the Department of Anesthesiology at [facility].  The right groin was prepped and draped in the usual sterile fashion. Thereafter using a modified Seldinger technique, transfemoral access into the right common femoral artery was obtained without difficulty. Over a 0.035 inch guidewire, a 5 French Pinnacle sheath was inserted. Through this, and  also over a 0.035 inch guidewire, a 5 French JB1 catheter was advanced to the right common carotid artery. The guidewire was removed. Good aspiration was obtained from the hub of the, 5 [place] diagnostic catheter.  An arteriogram was performed centered over the right common carotid artery bifurcation and intracranially.  The right external carotid artery and its major branches appear to be widely patent. Abnormal prominence of the internal maxillary artery, the infraorbital artery and the descending palatine artery were seen.  No extravasation of contrast however was noted.  Right internal carotid artery at the bulb demonstrated smooth atherosclerotic plaque along the posterior wall without associated ulcerations or calcifications. The right internal carotid artery was otherwise seen to opacify normally to the cranial skull base. The petrous, the cavernous and the supraclinoid segments opacified normally.  A right posterior communicating artery was seen to opacify the right posterior cerebral artery distribution.  The right middle and the right anterior cerebral artery were seen to opacify normally into the capillary and the venous phases.  SUPERSELECTIVE ENDOVASCULAR EMBOLIZATION OF EXTERNAL CAROTID ARTERY BRANCHES BILATERALLY USING PVA PARTICLES:  The diagnostic JB1 catheter in the right common carotid artery was then exchanged over a 0.035 inch 300 cm Rosen exchange guidewire for a 6 French 65 cm neurovascular Arrow sheath using biplane roadmap technique and constant fluoroscopic guidance. Good aspiration was obtained from the side port of the neurovascular sheath. A gentle contrast injection demonstrated no evidence of spasms, dissections or of intraluminal filling defects.  This was then connected to a continuous heparinized saline infusion.  Over the [employer] guidewire, a 6 French 90 cm BriteTip Envoy guide catheter was then advanced and positioned just proximal to the right common carotid bifurcation.  The guidewire was removed. Good aspiration was obtained from the hub of the 6 [place] guide catheter.  Gentle contrast injection demonstrated no evidence of spasms, dissections or of intraluminal filling defects.  Using biplane roadmap technique and constant fluoroscopic guidance, the 6 French [facility] BriteTip Envoy guide catheter was then advanced to the proximal right external carotid  artery using biplane roadmap technique and constant fluoroscopic guidance. The guidewire was removed. Good aspiration was obtained from the hub of the 6 [place] guide catheter. A gentle contrast injection demonstrated no evidence of spasms, dissections, or of intraluminal filling defects.  At this time using biplane roadmap technique and constant fluoroscopic guidance, in a coaxial manner and with constant heparinized saline infusion, a rapid transit 2 tip microcatheter which had been steam-shaped was then advanced over a 0.0140 inch Synchro micro guidewire to the distal end of the guide catheter.  With the micro guidewire leading with the J-tip configuration, the combination was navigated without difficulty to the internal maxillary artery and subsequently into the vessels just distal to the infraorbital artery.  The guidewire was removed. Good aspiration was obtained from the hub of the microcatheter. Gentle contrast injection demonstrated safe position of the tip of the microcatheter for embolization again. No abnormal dangerous communications with the vertebral artery or with the internal carotid arteries were seen.  At this time using biplane roadmap technique, combination of 250-355 micron PVA particles, 355-500 micron PVA particles and 500-700 micron PVA particles mixed in a combination of 75% contrast and 25% heparinized saline infusion, were injected under biplane intermittent fluoroscopy until stasis in the vessels sampled. The first branch was the internal maxillary artery distal to the infraorbital artery, the infraorbital  artery, the descending palatine artery, and the distal nasal septal branch of the facial artery.  Each of these vessels was entered with the micro guidewire using biplane intermittent fluoroscopy. Each of these blood vessels was then embolized superselectively under biplane intermittent fluoroscopy until there was stasis in the vessel of concern. Also prior to embolization, a gentle control arteriogram was performed through the micro catheter to ensure no pathological or dangerous communications with the internal carotid artery or the posterior circulation.  At the end of the embolization of all these vessels, a control arteriogram performed via the 6 [place] guide catheter in the right common carotid artery, demonstrated complete angiographic obliteration. Also no abnormal filling defects were seen within the intracranial circulation.  The a 6 [place] guide catheter was then positioned in the left common carotid artery as described above. An arteriogram was then performed seen intracranially which revealed the left internal carotid bifurcation and the left internal carotid artery to the cranial skull base to opacify.  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 This measured approximately 8 mm x 5 mm.  The petrous, the cavernous and the supraclinoid segments were also normal.  Normal opacification was seen of the left middle cerebral artery and left anterior cerebral artery into the capillary and the venous phases. Also noted was saccular shaped prominence in the left internal carotid artery terminus.  The left external carotid artery origin was normal.  Opacification of the branches of the external carotid artery appeared to be patent.  Again noted was abnormal prominence of the descending palatine branch of the internal maxillary artery distally.  The using biplane intermittent fluoroscopy, over a 0.035 inch Roadrunner guidewire, the  6 [place] guide catheter was positioned in the proximal left external carotid artery. The guidewire was removed. Good aspiration was obtained from the hub of the 6 [place] guide catheter. Using a biplane roadmap technique and constant fluoroscopic guidance, in a coaxial manner, and with constant heparinized saline infusion, rapid transit 2 tip microcatheter which had been steam-shaped was then advanced over a 0.014 inch Synchro soft micro guidewire  and advanced with the micro guidewire leading with a J-tip configuration to the internal maxillary artery distal to the origin of the infraorbital artery.  The guidewire was removed. Good aspiration was obtained from the hub of the microcatheter. A gentle contrast injection demonstrated no evidence of spasm, dissections, or of intraluminal filling defects. No abnormal communications were seen with the vertebral of the intracranial circulation.  Again, PVA particles of sizes 250 microns to 355 microns, 355 microns to 500 microns, and 700 microns were then injected in this branch, the infraorbital artery and also the descending palatine artery. Similar embolization was then performed superselectively of the left facial artery nasoseptal branch until there was stasis with reflux.  The microcatheter was then retrieved. A control arteriogram performed through the left external carotid artery demonstrated stasis within the embolized blood vessels as described.  With the guide catheter in the left common carotid artery, no change was seen in the intracranial circulation as described previously. Importantly no evidence of dissections, or intraluminal filling defects was seen. The 6 [place] guide catheter and the 6 French neurovascular sheath was then retrieved into the abdominal aorta and exchange over a J-tip guidewire for a 6 [place] Pinnacle sheath. This was then successfully retrieved with the application of an external closure device. The patient received 3000 units of IV  heparin achieving an ACT of around 170-180 seconds.  The patient's general anesthesia was then reversed and the patient was slowly extubated in the neuro ICU. Upon recovery, the patient demonstrated no new neurological signs or symptoms. During the procedure there was no evidence of bleeding from either nostril.  COMPLICATIONS: None immediate.  FINDINGS: As above.  IMPRESSION: Status post endovascular superselective embolization of external carotid artery branches bilaterally as described. PVA particles of sizes 250-355 microns, 355 microns to 500 microns, and 500-700 microns as described above, for recurrent epistaxis.   Electronically Signed   By: Luanne Bras M.D.   On: 07/17/2013 12:59   Ir Angiogram Follow Up Study  07/17/2013   CLINICAL DATA:  Recurrent epistaxis.  EXAM: BILATERAL COMMON CAROTID AND INNOMINATE ANGIOGRAPHY, AND BILATERAL EXTERNAL CAROTID ARTERY ARTERIOGRAM FOLLOWED BY ENDOVASCULAR SUPERSELECTIVE EMBOLIZATION OF EXTERNAL CAROTID ARTERY BRANCHES FOR RECURRENT PERSISTENT EPISTAXIS:  ANESTHESIA/SEDATION: General anesthesia.  MEDICATIONS: As per general anesthesia.  CONTRAST:  138mL OMNIPAQUE IOHEXOL 300 MG/ML SOLN  PROCEDURE: Following A full explanation of the procedure along with the potential associated complications, an informed witnessed consent was obtained. The patient was put under general anesthesia by the Department of Anesthesiology at [facility].  The right groin was prepped and draped in the usual sterile fashion. Thereafter using a modified Seldinger technique, transfemoral access into the right common femoral artery was obtained without difficulty. Over a 0.035 inch guidewire, a 5 French Pinnacle sheath was inserted. Through this, and also over a 0.035 inch guidewire, a 5 French JB1 catheter was advanced to the right common carotid artery. The guidewire was removed. Good aspiration was obtained from the hub of the, 5 [place] diagnostic catheter.  An arteriogram was  performed centered over the right common carotid artery bifurcation and intracranially.  The right external carotid artery and its major branches appear to be widely patent. Abnormal prominence of the internal maxillary artery, the infraorbital artery and the descending palatine artery were seen.  No extravasation of contrast however was noted.  Right internal carotid artery at the bulb demonstrated smooth atherosclerotic plaque along the posterior wall without associated ulcerations or calcifications. The right internal carotid artery  was otherwise seen to opacify normally to the cranial skull base. The petrous, the cavernous and the supraclinoid segments opacified normally.  A right posterior communicating artery was seen to opacify the right posterior cerebral artery distribution.  The right middle and the right anterior cerebral artery were seen to opacify normally into the capillary and the venous phases.  SUPERSELECTIVE ENDOVASCULAR EMBOLIZATION OF EXTERNAL CAROTID ARTERY BRANCHES BILATERALLY USING PVA PARTICLES:  The diagnostic JB1 catheter in the right common carotid artery was then exchanged over a 0.035 inch 300 cm Rosen exchange guidewire for a 6 French 65 cm neurovascular Arrow sheath using biplane roadmap technique and constant fluoroscopic guidance. Good aspiration was obtained from the side port of the neurovascular sheath. A gentle contrast injection demonstrated no evidence of spasms, dissections or of intraluminal filling defects.  This was then connected to a continuous heparinized saline infusion.  Over the [employer] guidewire, a 6 French 90 cm BriteTip Envoy guide catheter was then advanced and positioned just proximal to the right common carotid bifurcation. The guidewire was removed. Good aspiration was obtained from the hub of the 6 [place] guide catheter.  Gentle contrast injection demonstrated no evidence of spasms, dissections or of intraluminal filling defects.  Using biplane roadmap  technique and constant fluoroscopic guidance, the 6 French [facility] BriteTip Envoy guide catheter was then advanced to the proximal right external carotid artery using biplane roadmap technique and constant fluoroscopic guidance. The guidewire was removed. Good aspiration was obtained from the hub of the 6 [place] guide catheter. A gentle contrast injection demonstrated no evidence of spasms, dissections, or of intraluminal filling defects.  At this time using biplane roadmap technique and constant fluoroscopic guidance, in a coaxial manner and with constant heparinized saline infusion, a rapid transit 2 tip microcatheter which had been steam-shaped was then advanced over a 0.0140 inch Synchro micro guidewire to the distal end of the guide catheter.  With the micro guidewire leading with the J-tip configuration, the combination was navigated without difficulty to the internal maxillary artery and subsequently into the vessels just distal to the infraorbital artery.  The guidewire was removed. Good aspiration was obtained from the hub of the microcatheter. Gentle contrast injection demonstrated safe position of the tip of the microcatheter for embolization again. No abnormal dangerous communications with the vertebral artery or with the internal carotid arteries were seen.  At this time using biplane roadmap technique, combination of 250-355 micron PVA particles, 355-500 micron PVA particles and 500-700 micron PVA particles mixed in a combination of 75% contrast and 25% heparinized saline infusion, were injected under biplane intermittent fluoroscopy until stasis in the vessels sampled. The first branch was the internal maxillary artery distal to the infraorbital artery, the infraorbital artery, the descending palatine artery, and the distal nasal septal branch of the facial artery.  Each of these vessels was entered with the micro guidewire using biplane intermittent fluoroscopy. Each of these blood vessels was then  embolized superselectively under biplane intermittent fluoroscopy until there was stasis in the vessel of concern. Also prior to embolization, a gentle control arteriogram was performed through the micro catheter to ensure no pathological or dangerous communications with the internal carotid artery or the posterior circulation.  At the end of the embolization of all these vessels, a control arteriogram performed via the 6 [place] guide catheter in the right common carotid artery, demonstrated complete angiographic obliteration. Also no abnormal filling defects were seen within the intracranial circulation.  The a 6 [place] guide catheter  was then positioned in the left common carotid artery as described above. An arteriogram was then performed seen intracranially which revealed the left internal carotid bifurcation and the left internal carotid artery to the cranial skull base to opacify.  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 This measured approximately 8 mm x 5 mm.  The petrous, the cavernous and the supraclinoid segments were also normal.  Normal opacification was seen of the left middle cerebral artery and left anterior cerebral artery into the capillary and the venous phases. Also noted was saccular shaped prominence in the left internal carotid artery terminus.  The left external carotid artery origin was normal.  Opacification of the branches of the external carotid artery appeared to be patent.  Again noted was abnormal prominence of the descending palatine branch of the internal maxillary artery distally.  The using biplane intermittent fluoroscopy, over a 0.035 inch Roadrunner guidewire, the 6 [place] guide catheter was positioned in the proximal left external carotid artery. The guidewire was removed. Good aspiration was obtained from the hub of the 6 [place] guide catheter. Using a biplane roadmap technique and constant  fluoroscopic guidance, in a coaxial manner, and with constant heparinized saline infusion, rapid transit 2 tip microcatheter which had been steam-shaped was then advanced over a 0.014 inch Synchro soft micro guidewire and advanced with the micro guidewire leading with a J-tip configuration to the internal maxillary artery distal to the origin of the infraorbital artery.  The guidewire was removed. Good aspiration was obtained from the hub of the microcatheter. A gentle contrast injection demonstrated no evidence of spasm, dissections, or of intraluminal filling defects. No abnormal communications were seen with the vertebral of the intracranial circulation.  Again, PVA particles of sizes 250 microns to 355 microns, 355 microns to 500 microns, and 700 microns were then injected in this branch, the infraorbital artery and also the descending palatine artery. Similar embolization was then performed superselectively of the left facial artery nasoseptal branch until there was stasis with reflux.  The microcatheter was then retrieved. A control arteriogram performed through the left external carotid artery demonstrated stasis within the embolized blood vessels as described.  With the guide catheter in the left common carotid artery, no change was seen in the intracranial circulation as described previously. Importantly no evidence of dissections, or intraluminal filling defects was seen. The 6 [place] guide catheter and the 6 French neurovascular sheath was then retrieved into the abdominal aorta and exchange over a J-tip guidewire for a 6 [place] Pinnacle sheath. This was then successfully retrieved with the application of an external closure device. The patient received 3000 units of IV heparin achieving an ACT of around 170-180 seconds.  The patient's general anesthesia was then reversed and the patient was slowly extubated in the neuro ICU. Upon recovery, the patient demonstrated no new neurological signs or symptoms.  During the procedure there was no evidence of bleeding from either nostril.  COMPLICATIONS: None immediate.  FINDINGS: As above.  IMPRESSION: Status post endovascular superselective embolization of external carotid artery branches bilaterally as described. PVA particles of sizes 250-355 microns, 355 microns to 500 microns, and 500-700 microns as described above, for recurrent epistaxis.   Electronically Signed   By: Luanne Bras M.D.   On: 07/17/2013 12:59   Ir Angio Intra Extracran Sel Com Carotid Innominate Bilat Mod Sed  07/16/2013   CLINICAL DATA:  Recurrent epistaxis.  EXAM: BILATERAL COMMON CAROTID  AND INNOMINATE ANGIOGRAPHY, AND BILATERAL EXTERNAL CAROTID ARTERY ARTERIOGRAM FOLLOWED BY ENDOVASCULAR SUPERSELECTIVE EMBOLIZATION OF EXTERNAL CAROTID ARTERY BRANCHES FOR RECURRENT PERSISTENT EPISTAXIS:  ANESTHESIA/SEDATION: General anesthesia.  MEDICATIONS: As per general anesthesia.  CONTRAST:  166mL OMNIPAQUE IOHEXOL 300 MG/ML  SOLN  PROCEDURE: Following A full explanation of the procedure along with the potential associated complications, an informed witnessed consent was obtained. The patient was put under general anesthesia by the Department of Anesthesiology at [facility].  The right groin was prepped and draped in the usual sterile fashion. Thereafter using a modified Seldinger technique, transfemoral access into the right common femoral artery was obtained without difficulty. Over a 0.035 inch guidewire, a 5 French Pinnacle sheath was inserted. Through this, and also over a 0.035 inch guidewire, a 5 French JB1 catheter was advanced to the right common carotid artery. The guidewire was removed. Good aspiration was obtained from the hub of the, 5 [place] diagnostic catheter.  An arteriogram was performed centered over the right common carotid artery bifurcation and intracranially.  The right external carotid artery and its major branches appear to be widely patent. Abnormal prominence of the  internal maxillary artery, the infraorbital artery and the descending palatine artery were seen.  No extravasation of contrast however was noted.  Right internal carotid artery at the bulb demonstrated smooth atherosclerotic plaque along the posterior wall without associated ulcerations or calcifications. The right internal carotid artery was otherwise seen to opacify normally to the cranial skull base. The petrous, the cavernous and the supraclinoid segments opacified normally.  A right posterior communicating artery was seen to opacify the right posterior cerebral artery distribution.  The right middle and the right anterior cerebral artery were seen to opacify normally into the capillary and the venous phases.  SUPERSELECTIVE ENDOVASCULAR EMBOLIZATION OF EXTERNAL CAROTID ARTERY BRANCHES BILATERALLY USING PVA PARTICLES:  The diagnostic JB1 catheter in the right common carotid artery was then exchanged over a 0.035 inch 300 cm Rosen exchange guidewire for a 6 French 65 cm neurovascular Arrow sheath using biplane roadmap technique and constant fluoroscopic guidance. Good aspiration was obtained from the side port of the neurovascular sheath. A gentle contrast injection demonstrated no evidence of spasms, dissections or of intraluminal filling defects.  This was then connected to a continuous heparinized saline infusion.  Over the [employer] guidewire, a 6 French 90 cm BriteTip Envoy guide catheter was then advanced and positioned just proximal to the right common carotid bifurcation. The guidewire was removed. Good aspiration was obtained from the hub of the 6 [place] guide catheter.  Gentle contrast injection demonstrated no evidence of spasms, dissections or of intraluminal filling defects.  Using biplane roadmap technique and constant fluoroscopic guidance, the 6 French [facility] BriteTip Envoy guide catheter was then advanced to the proximal right external carotid artery using biplane roadmap technique and constant  fluoroscopic guidance. The guidewire was removed. Good aspiration was obtained from the hub of the 6 [place] guide catheter. A gentle contrast injection demonstrated no evidence of spasms, dissections, or of intraluminal filling defects.  At this time using biplane roadmap technique and constant fluoroscopic guidance, in a coaxial manner and with constant heparinized saline infusion, a rapid transit 2 tip microcatheter which had been steam-shaped was then advanced over a 0.0140 inch Synchro micro guidewire to the distal end of the guide catheter.  With the micro guidewire leading with the J-tip configuration, the combination was navigated without difficulty to the internal maxillary artery and subsequently into the vessels just distal  to the infraorbital artery.  The guidewire was removed. Good aspiration was obtained from the hub of the microcatheter. Gentle contrast injection demonstrated safe position of the tip of the microcatheter for embolization again. No abnormal dangerous communications with the vertebral artery or with the internal carotid arteries were seen.  At this time using biplane roadmap technique, combination of 250-355 micron PVA particles, 355-500 micron PVA particles and 500-700 micron PVA particles mixed in a combination of 75% contrast and 25% heparinized saline infusion, were injected under biplane intermittent fluoroscopy until stasis in the vessels sampled. The first branch was the internal maxillary artery distal to the infraorbital artery, the infraorbital artery, the descending palatine artery, and the distal nasal septal branch of the facial artery.  Each of these vessels was entered with the micro guidewire using biplane intermittent fluoroscopy. Each of these blood vessels was then embolized superselectively under biplane intermittent fluoroscopy until there was stasis in the vessel of concern. Also prior to embolization, a gentle control arteriogram was performed through the micro  catheter to ensure no pathological or dangerous communications with the internal carotid artery or the posterior circulation.  At the end of the embolization of all these vessels, a control arteriogram performed via the 6 [place] guide catheter in the right common carotid artery, demonstrated complete angiographic obliteration. Also no abnormal filling defects were seen within the intracranial circulation.  The a 6 [place] guide catheter was then positioned in the left common carotid artery as described above. An arteriogram was then performed seen intracranially which revealed the left internal carotid bifurcation and the left internal carotid artery to the cranial skull base to opacify.  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 This measured approximately 8 mm x 5 mm.  The petrous, the cavernous and the supraclinoid segments were also normal.  Normal opacification was seen of the left middle cerebral artery and left anterior cerebral artery into the capillary and the venous phases. Also noted was saccular shaped prominence in the left internal carotid artery terminus.  The left external carotid artery origin was normal.  Opacification of the branches of the external carotid artery appeared to be patent.  Again noted was abnormal prominence of the descending palatine branch of the internal maxillary artery distally.  The using biplane intermittent fluoroscopy, over a 0.035 inch Roadrunner guidewire, the 6 [place] guide catheter was positioned in the proximal left external carotid artery. The guidewire was removed. Good aspiration was obtained from the hub of the 6 [place] guide catheter. Using a biplane roadmap technique and constant fluoroscopic guidance, in a coaxial manner, and with constant heparinized saline infusion, rapid transit 2 tip microcatheter which had been steam-shaped was then advanced over a 0.014 inch Synchro soft  micro guidewire and advanced with the micro guidewire leading with a J-tip configuration to the internal maxillary artery distal to the origin of the infraorbital artery.  The guidewire was removed. Good aspiration was obtained from the hub of the microcatheter. A gentle contrast injection demonstrated no evidence of spasm, dissections, or of intraluminal filling defects. No abnormal communications were seen with the vertebral of the intracranial circulation.  Again, PVA particles of sizes 250 microns to 355 microns, 355 microns to 500 microns, and 700 microns were then injected in this branch, the infraorbital artery and also the descending palatine artery. Similar embolization was then performed superselectively of the left facial artery nasoseptal branch until there was stasis with reflux.  The microcatheter was then retrieved. A control arteriogram performed through the left external carotid artery demonstrated stasis within the embolized blood vessels as described.  With the guide catheter in the left common carotid artery, no change was seen in the intracranial circulation as described previously. Importantly no evidence of dissections, or intraluminal filling defects was seen. The 6 [place] guide catheter and the 6 [place] neurovascular sheath was then retrieved into the abdominal aorta and exchange over a J-tip guidewire for a 6 [place] Pinnacle sheath. This was then successfully retrieved with the application of an external closure device. The patient received 3000 units of IV heparin achieving an ACT of around 170-180 seconds.  The patient's general anesthesia was then reversed and the patient was slowly extubated in the neuro ICU. Upon recovery, the patient demonstrated no new neurological signs or symptoms. During the procedure there was no evidence of bleeding from either nostril.  COMPLICATIONS: None immediate.  FINDINGS: As above.  IMPRESSION: Status post endovascular superselective embolization of  external carotid artery branches bilaterally as described. PVA particles of sizes 250-355 microns, 355 microns to 500 microns, and 500-700 microns as described above, for recurrent epistaxis.   Electronically Signed   By: Luanne Bras M.D.   On: 07/14/2013 11:47   Ir Angio External Carotid Sel Ext Carotid Bilat Mod Sed  07/17/2013   CLINICAL DATA:  Recurrent epistaxis.  EXAM: BILATERAL COMMON CAROTID AND INNOMINATE ANGIOGRAPHY, AND BILATERAL EXTERNAL CAROTID ARTERY ARTERIOGRAM FOLLOWED BY ENDOVASCULAR SUPERSELECTIVE EMBOLIZATION OF EXTERNAL CAROTID ARTERY BRANCHES FOR RECURRENT PERSISTENT EPISTAXIS:  ANESTHESIA/SEDATION: General anesthesia.  MEDICATIONS: As per general anesthesia.  CONTRAST:  182mL OMNIPAQUE IOHEXOL 300 MG/ML SOLN  PROCEDURE: Following A full explanation of the procedure along with the potential associated complications, an informed witnessed consent was obtained. The patient was put under general anesthesia by the Department of Anesthesiology at [facility].  The right groin was prepped and draped in the usual sterile fashion. Thereafter using a modified Seldinger technique, transfemoral access into the right common femoral artery was obtained without difficulty. Over a 0.035 inch guidewire, a 5 French Pinnacle sheath was inserted. Through this, and also over a 0.035 inch guidewire, a 5 French JB1 catheter was advanced to the right common carotid artery. The guidewire was removed. Good aspiration was obtained from the hub of the, 5 [place] diagnostic catheter.  An arteriogram was performed centered over the right common carotid artery bifurcation and intracranially.  The right external carotid artery and its major branches appear to be widely patent. Abnormal prominence of the internal maxillary artery, the infraorbital artery and the descending palatine artery were seen.  No extravasation of contrast however was noted.  Right internal carotid artery at the bulb demonstrated smooth  atherosclerotic plaque along the posterior wall without associated ulcerations or calcifications. The right internal carotid artery was otherwise seen to opacify normally to the cranial skull base. The petrous, the cavernous and the supraclinoid segments opacified normally.  A right posterior communicating artery was seen to opacify the right posterior cerebral artery distribution.  The right middle and the right anterior cerebral artery were seen to opacify normally into the capillary and the venous phases.  SUPERSELECTIVE ENDOVASCULAR EMBOLIZATION OF EXTERNAL CAROTID ARTERY BRANCHES BILATERALLY USING PVA PARTICLES:  The diagnostic JB1 catheter in the right common carotid artery was then exchanged over a 0.035 inch 300 cm Rosen exchange guidewire for a 6 French 65 cm neurovascular Arrow sheath using biplane roadmap technique and constant fluoroscopic guidance. Good aspiration was  obtained from the side port of the neurovascular sheath. A gentle contrast injection demonstrated no evidence of spasms, dissections or of intraluminal filling defects.  This was then connected to a continuous heparinized saline infusion.  Over the [employer] guidewire, a 6 French 90 cm BriteTip Envoy guide catheter was then advanced and positioned just proximal to the right common carotid bifurcation. The guidewire was removed. Good aspiration was obtained from the hub of the 6 [place] guide catheter.  Gentle contrast injection demonstrated no evidence of spasms, dissections or of intraluminal filling defects.  Using biplane roadmap technique and constant fluoroscopic guidance, the 6 French [facility] BriteTip Envoy guide catheter was then advanced to the proximal right external carotid artery using biplane roadmap technique and constant fluoroscopic guidance. The guidewire was removed. Good aspiration was obtained from the hub of the 6 [place] guide catheter. A gentle contrast injection demonstrated no evidence of spasms, dissections, or of  intraluminal filling defects.  At this time using biplane roadmap technique and constant fluoroscopic guidance, in a coaxial manner and with constant heparinized saline infusion, a rapid transit 2 tip microcatheter which had been steam-shaped was then advanced over a 0.0140 inch Synchro micro guidewire to the distal end of the guide catheter.  With the micro guidewire leading with the J-tip configuration, the combination was navigated without difficulty to the internal maxillary artery and subsequently into the vessels just distal to the infraorbital artery.  The guidewire was removed. Good aspiration was obtained from the hub of the microcatheter. Gentle contrast injection demonstrated safe position of the tip of the microcatheter for embolization again. No abnormal dangerous communications with the vertebral artery or with the internal carotid arteries were seen.  At this time using biplane roadmap technique, combination of 250-355 micron PVA particles, 355-500 micron PVA particles and 500-700 micron PVA particles mixed in a combination of 75% contrast and 25% heparinized saline infusion, were injected under biplane intermittent fluoroscopy until stasis in the vessels sampled. The first branch was the internal maxillary artery distal to the infraorbital artery, the infraorbital artery, the descending palatine artery, and the distal nasal septal branch of the facial artery.  Each of these vessels was entered with the micro guidewire using biplane intermittent fluoroscopy. Each of these blood vessels was then embolized superselectively under biplane intermittent fluoroscopy until there was stasis in the vessel of concern. Also prior to embolization, a gentle control arteriogram was performed through the micro catheter to ensure no pathological or dangerous communications with the internal carotid artery or the posterior circulation.  At the end of the embolization of all these vessels, a control arteriogram  performed via the 6 [place] guide catheter in the right common carotid artery, demonstrated complete angiographic obliteration. Also no abnormal filling defects were seen within the intracranial circulation.  The a 6 [place] guide catheter was then positioned in the left common carotid artery as described above. An arteriogram was then performed seen intracranially which revealed the left internal carotid bifurcation and the left internal carotid artery to the cranial skull base to opacify.  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 This measured approximately 8 mm x 5 mm.  The petrous, the cavernous and the supraclinoid segments were also normal.  Normal opacification was seen of the left middle cerebral artery and left anterior cerebral artery into the capillary and the venous phases. Also noted was saccular shaped prominence in the left internal carotid artery terminus.  The left external carotid artery origin was normal.  Opacification of the branches of the external carotid artery appeared to be patent.  Again noted was abnormal prominence of the descending palatine branch of the internal maxillary artery distally.  The using biplane intermittent fluoroscopy, over a 0.035 inch Roadrunner guidewire, the 6 [place] guide catheter was positioned in the proximal left external carotid artery. The guidewire was removed. Good aspiration was obtained from the hub of the 6 [place] guide catheter. Using a biplane roadmap technique and constant fluoroscopic guidance, in a coaxial manner, and with constant heparinized saline infusion, rapid transit 2 tip microcatheter which had been steam-shaped was then advanced over a 0.014 inch Synchro soft micro guidewire and advanced with the micro guidewire leading with a J-tip configuration to the internal maxillary artery distal to the origin of the infraorbital artery.  The guidewire was removed. Good  aspiration was obtained from the hub of the microcatheter. A gentle contrast injection demonstrated no evidence of spasm, dissections, or of intraluminal filling defects. No abnormal communications were seen with the vertebral of the intracranial circulation.  Again, PVA particles of sizes 250 microns to 355 microns, 355 microns to 500 microns, and 700 microns were then injected in this branch, the infraorbital artery and also the descending palatine artery. Similar embolization was then performed superselectively of the left facial artery nasoseptal branch until there was stasis with reflux.  The microcatheter was then retrieved. A control arteriogram performed through the left external carotid artery demonstrated stasis within the embolized blood vessels as described.  With the guide catheter in the left common carotid artery, no change was seen in the intracranial circulation as described previously. Importantly no evidence of dissections, or intraluminal filling defects was seen. The 6 [place] guide catheter and the 6 French neurovascular sheath was then retrieved into the abdominal aorta and exchange over a J-tip guidewire for a 6 [place] Pinnacle sheath. This was then successfully retrieved with the application of an external closure device. The patient received 3000 units of IV heparin achieving an ACT of around 170-180 seconds.  The patient's general anesthesia was then reversed and the patient was slowly extubated in the neuro ICU. Upon recovery, the patient demonstrated no new neurological signs or symptoms. During the procedure there was no evidence of bleeding from either nostril.  COMPLICATIONS: None immediate.  FINDINGS: As above.  IMPRESSION: Status post endovascular superselective embolization of external carotid artery branches bilaterally as described. PVA particles of sizes 250-355 microns, 355 microns to 500 microns, and 500-700 microns as described above, for recurrent epistaxis.   Electronically  Signed   By: Luanne Bras M.D.   On: 07/17/2013 13:00   Ir Neuro Each Add'l After Basic Uni Left (ms)  07/17/2013   CLINICAL DATA:  Recurrent epistaxis.  EXAM: BILATERAL COMMON CAROTID AND INNOMINATE ANGIOGRAPHY, AND BILATERAL EXTERNAL CAROTID ARTERY ARTERIOGRAM FOLLOWED BY ENDOVASCULAR SUPERSELECTIVE EMBOLIZATION OF EXTERNAL CAROTID ARTERY BRANCHES FOR RECURRENT PERSISTENT EPISTAXIS:  ANESTHESIA/SEDATION: General anesthesia.  MEDICATIONS: As per general anesthesia.  CONTRAST:  151mL OMNIPAQUE IOHEXOL 300 MG/ML SOLN  PROCEDURE: Following A full explanation of the procedure along with the potential associated complications, an informed witnessed consent was obtained. The patient was put under general anesthesia by the Department of Anesthesiology at [facility].  The right groin was prepped and draped in the usual sterile fashion. Thereafter using a modified Seldinger technique, transfemoral access into the right common femoral artery was obtained without difficulty. Over a 0.035 inch guidewire, a 5  French Pinnacle sheath was inserted. Through this, and also over a 0.035 inch guidewire, a 5 French JB1 catheter was advanced to the right common carotid artery. The guidewire was removed. Good aspiration was obtained from the hub of the, 5 [place] diagnostic catheter.  An arteriogram was performed centered over the right common carotid artery bifurcation and intracranially.  The right external carotid artery and its major branches appear to be widely patent. Abnormal prominence of the internal maxillary artery, the infraorbital artery and the descending palatine artery were seen.  No extravasation of contrast however was noted.  Right internal carotid artery at the bulb demonstrated smooth atherosclerotic plaque along the posterior wall without associated ulcerations or calcifications. The right internal carotid artery was otherwise seen to opacify normally to the cranial skull base. The petrous, the  cavernous and the supraclinoid segments opacified normally.  A right posterior communicating artery was seen to opacify the right posterior cerebral artery distribution.  The right middle and the right anterior cerebral artery were seen to opacify normally into the capillary and the venous phases.  SUPERSELECTIVE ENDOVASCULAR EMBOLIZATION OF EXTERNAL CAROTID ARTERY BRANCHES BILATERALLY USING PVA PARTICLES:  The diagnostic JB1 catheter in the right common carotid artery was then exchanged over a 0.035 inch 300 cm Rosen exchange guidewire for a 6 French 65 cm neurovascular Arrow sheath using biplane roadmap technique and constant fluoroscopic guidance. Good aspiration was obtained from the side port of the neurovascular sheath. A gentle contrast injection demonstrated no evidence of spasms, dissections or of intraluminal filling defects.  This was then connected to a continuous heparinized saline infusion.  Over the [employer] guidewire, a 6 French 90 cm BriteTip Envoy guide catheter was then advanced and positioned just proximal to the right common carotid bifurcation. The guidewire was removed. Good aspiration was obtained from the hub of the 6 [place] guide catheter.  Gentle contrast injection demonstrated no evidence of spasms, dissections or of intraluminal filling defects.  Using biplane roadmap technique and constant fluoroscopic guidance, the 6 French [facility] BriteTip Envoy guide catheter was then advanced to the proximal right external carotid artery using biplane roadmap technique and constant fluoroscopic guidance. The guidewire was removed. Good aspiration was obtained from the hub of the 6 [place] guide catheter. A gentle contrast injection demonstrated no evidence of spasms, dissections, or of intraluminal filling defects.  At this time using biplane roadmap technique and constant fluoroscopic guidance, in a coaxial manner and with constant heparinized saline infusion, a rapid transit 2 tip microcatheter  which had been steam-shaped was then advanced over a 0.0140 inch Synchro micro guidewire to the distal end of the guide catheter.  With the micro guidewire leading with the J-tip configuration, the combination was navigated without difficulty to the internal maxillary artery and subsequently into the vessels just distal to the infraorbital artery.  The guidewire was removed. Good aspiration was obtained from the hub of the microcatheter. Gentle contrast injection demonstrated safe position of the tip of the microcatheter for embolization again. No abnormal dangerous communications with the vertebral artery or with the internal carotid arteries were seen.  At this time using biplane roadmap technique, combination of 250-355 micron PVA particles, 355-500 micron PVA particles and 500-700 micron PVA particles mixed in a combination of 75% contrast and 25% heparinized saline infusion, were injected under biplane intermittent fluoroscopy until stasis in the vessels sampled. The first branch was the internal maxillary artery distal to the infraorbital artery, the infraorbital artery, the descending palatine artery, and  the distal nasal septal branch of the facial artery.  Each of these vessels was entered with the micro guidewire using biplane intermittent fluoroscopy. Each of these blood vessels was then embolized superselectively under biplane intermittent fluoroscopy until there was stasis in the vessel of concern. Also prior to embolization, a gentle control arteriogram was performed through the micro catheter to ensure no pathological or dangerous communications with the internal carotid artery or the posterior circulation.  At the end of the embolization of all these vessels, a control arteriogram performed via the 6 [place] guide catheter in the right common carotid artery, demonstrated complete angiographic obliteration. Also no abnormal filling defects were seen within the intracranial circulation.  The a 6 [place]  guide catheter was then positioned in the left common carotid artery as described above. An arteriogram was then performed seen intracranially which revealed the left internal carotid bifurcation and the left internal carotid artery to the cranial skull base to opacify.  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 This measured approximately 8 mm x 5 mm.  The petrous, the cavernous and the supraclinoid segments were also normal.  Normal opacification was seen of the left middle cerebral artery and left anterior cerebral artery into the capillary and the venous phases. Also noted was saccular shaped prominence in the left internal carotid artery terminus.  The left external carotid artery origin was normal.  Opacification of the branches of the external carotid artery appeared to be patent.  Again noted was abnormal prominence of the descending palatine branch of the internal maxillary artery distally.  The using biplane intermittent fluoroscopy, over a 0.035 inch Roadrunner guidewire, the 6 [place] guide catheter was positioned in the proximal left external carotid artery. The guidewire was removed. Good aspiration was obtained from the hub of the 6 [place] guide catheter. Using a biplane roadmap technique and constant fluoroscopic guidance, in a coaxial manner, and with constant heparinized saline infusion, rapid transit 2 tip microcatheter which had been steam-shaped was then advanced over a 0.014 inch Synchro soft micro guidewire and advanced with the micro guidewire leading with a J-tip configuration to the internal maxillary artery distal to the origin of the infraorbital artery.  The guidewire was removed. Good aspiration was obtained from the hub of the microcatheter. A gentle contrast injection demonstrated no evidence of spasm, dissections, or of intraluminal filling defects. No abnormal communications were seen with the  vertebral of the intracranial circulation.  Again, PVA particles of sizes 250 microns to 355 microns, 355 microns to 500 microns, and 700 microns were then injected in this branch, the infraorbital artery and also the descending palatine artery. Similar embolization was then performed superselectively of the left facial artery nasoseptal branch until there was stasis with reflux.  The microcatheter was then retrieved. A control arteriogram performed through the left external carotid artery demonstrated stasis within the embolized blood vessels as described.  With the guide catheter in the left common carotid artery, no change was seen in the intracranial circulation as described previously. Importantly no evidence of dissections, or intraluminal filling defects was seen. The 6 [place] guide catheter and the 6 French neurovascular sheath was then retrieved into the abdominal aorta and exchange over a J-tip guidewire for a 6 [place] Pinnacle sheath. This was then successfully retrieved with the application of an external closure device. The patient received 3000 units of IV heparin achieving an ACT of around 170-180 seconds.  The  patient's general anesthesia was then reversed and the patient was slowly extubated in the neuro ICU. Upon recovery, the patient demonstrated no new neurological signs or symptoms. During the procedure there was no evidence of bleeding from either nostril.  COMPLICATIONS: None immediate.  FINDINGS: As above.  IMPRESSION: Status post endovascular superselective embolization of external carotid artery branches bilaterally as described. PVA particles of sizes 250-355 microns, 355 microns to 500 microns, and 500-700 microns as described above, for recurrent epistaxis.   Electronically Signed   By: Luanne Bras M.D.   On: 07/17/2013 12:58   Ir Neuro Each Add'l After Basic Uni Left (ms)  07/17/2013   CLINICAL DATA:  Recurrent epistaxis.  EXAM: BILATERAL COMMON CAROTID AND INNOMINATE  ANGIOGRAPHY, AND BILATERAL EXTERNAL CAROTID ARTERY ARTERIOGRAM FOLLOWED BY ENDOVASCULAR SUPERSELECTIVE EMBOLIZATION OF EXTERNAL CAROTID ARTERY BRANCHES FOR RECURRENT PERSISTENT EPISTAXIS:  ANESTHESIA/SEDATION: General anesthesia.  MEDICATIONS: As per general anesthesia.  CONTRAST:  111mL OMNIPAQUE IOHEXOL 300 MG/ML SOLN  PROCEDURE: Following A full explanation of the procedure along with the potential associated complications, an informed witnessed consent was obtained. The patient was put under general anesthesia by the Department of Anesthesiology at [facility].  The right groin was prepped and draped in the usual sterile fashion. Thereafter using a modified Seldinger technique, transfemoral access into the right common femoral artery was obtained without difficulty. Over a 0.035 inch guidewire, a 5 French Pinnacle sheath was inserted. Through this, and also over a 0.035 inch guidewire, a 5 French JB1 catheter was advanced to the right common carotid artery. The guidewire was removed. Good aspiration was obtained from the hub of the, 5 [place] diagnostic catheter.  An arteriogram was performed centered over the right common carotid artery bifurcation and intracranially.  The right external carotid artery and its major branches appear to be widely patent. Abnormal prominence of the internal maxillary artery, the infraorbital artery and the descending palatine artery were seen.  No extravasation of contrast however was noted.  Right internal carotid artery at the bulb demonstrated smooth atherosclerotic plaque along the posterior wall without associated ulcerations or calcifications. The right internal carotid artery was otherwise seen to opacify normally to the cranial skull base. The petrous, the cavernous and the supraclinoid segments opacified normally.  A right posterior communicating artery was seen to opacify the right posterior cerebral artery distribution.  The right middle and the right anterior  cerebral artery were seen to opacify normally into the capillary and the venous phases.  SUPERSELECTIVE ENDOVASCULAR EMBOLIZATION OF EXTERNAL CAROTID ARTERY BRANCHES BILATERALLY USING PVA PARTICLES:  The diagnostic JB1 catheter in the right common carotid artery was then exchanged over a 0.035 inch 300 cm Rosen exchange guidewire for a 6 French 65 cm neurovascular Arrow sheath using biplane roadmap technique and constant fluoroscopic guidance. Good aspiration was obtained from the side port of the neurovascular sheath. A gentle contrast injection demonstrated no evidence of spasms, dissections or of intraluminal filling defects.  This was then connected to a continuous heparinized saline infusion.  Over the [employer] guidewire, a 6 French 90 cm BriteTip Envoy guide catheter was then advanced and positioned just proximal to the right common carotid bifurcation. The guidewire was removed. Good aspiration was obtained from the hub of the 6 [place] guide catheter.  Gentle contrast injection demonstrated no evidence of spasms, dissections or of intraluminal filling defects.  Using biplane roadmap technique and constant fluoroscopic guidance, the 6 French [facility] BriteTip Envoy guide catheter was then advanced to the proximal  right external carotid artery using biplane roadmap technique and constant fluoroscopic guidance. The guidewire was removed. Good aspiration was obtained from the hub of the 6 [place] guide catheter. A gentle contrast injection demonstrated no evidence of spasms, dissections, or of intraluminal filling defects.  At this time using biplane roadmap technique and constant fluoroscopic guidance, in a coaxial manner and with constant heparinized saline infusion, a rapid transit 2 tip microcatheter which had been steam-shaped was then advanced over a 0.0140 inch Synchro micro guidewire to the distal end of the guide catheter.  With the micro guidewire leading with the J-tip configuration, the combination  was navigated without difficulty to the internal maxillary artery and subsequently into the vessels just distal to the infraorbital artery.  The guidewire was removed. Good aspiration was obtained from the hub of the microcatheter. Gentle contrast injection demonstrated safe position of the tip of the microcatheter for embolization again. No abnormal dangerous communications with the vertebral artery or with the internal carotid arteries were seen.  At this time using biplane roadmap technique, combination of 250-355 micron PVA particles, 355-500 micron PVA particles and 500-700 micron PVA particles mixed in a combination of 75% contrast and 25% heparinized saline infusion, were injected under biplane intermittent fluoroscopy until stasis in the vessels sampled. The first branch was the internal maxillary artery distal to the infraorbital artery, the infraorbital artery, the descending palatine artery, and the distal nasal septal branch of the facial artery.  Each of these vessels was entered with the micro guidewire using biplane intermittent fluoroscopy. Each of these blood vessels was then embolized superselectively under biplane intermittent fluoroscopy until there was stasis in the vessel of concern. Also prior to embolization, a gentle control arteriogram was performed through the micro catheter to ensure no pathological or dangerous communications with the internal carotid artery or the posterior circulation.  At the end of the embolization of all these vessels, a control arteriogram performed via the 6 [place] guide catheter in the right common carotid artery, demonstrated complete angiographic obliteration. Also no abnormal filling defects were seen within the intracranial circulation.  The a 6 [place] guide catheter was then positioned in the left common carotid artery as described above. An arteriogram was then performed seen intracranially which revealed the left internal carotid bifurcation and the left  internal carotid artery to the cranial skull base to opacify.  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 This measured approximately 8 mm x 5 mm.  The petrous, the cavernous and the supraclinoid segments were also normal.  Normal opacification was seen of the left middle cerebral artery and left anterior cerebral artery into the capillary and the venous phases. Also noted was saccular shaped prominence in the left internal carotid artery terminus.  The left external carotid artery origin was normal.  Opacification of the branches of the external carotid artery appeared to be patent.  Again noted was abnormal prominence of the descending palatine branch of the internal maxillary artery distally.  The using biplane intermittent fluoroscopy, over a 0.035 inch Roadrunner guidewire, the 6 [place] guide catheter was positioned in the proximal left external carotid artery. The guidewire was removed. Good aspiration was obtained from the hub of the 6 [place] guide catheter. Using a biplane roadmap technique and constant fluoroscopic guidance, in a coaxial manner, and with constant heparinized saline infusion, rapid transit 2 tip microcatheter which had been steam-shaped was then advanced over a 0.014 inch Synchro  soft micro guidewire and advanced with the micro guidewire leading with a J-tip configuration to the internal maxillary artery distal to the origin of the infraorbital artery.  The guidewire was removed. Good aspiration was obtained from the hub of the microcatheter. A gentle contrast injection demonstrated no evidence of spasm, dissections, or of intraluminal filling defects. No abnormal communications were seen with the vertebral of the intracranial circulation.  Again, PVA particles of sizes 250 microns to 355 microns, 355 microns to 500 microns, and 700 microns were then injected in this branch, the infraorbital artery and also  the descending palatine artery. Similar embolization was then performed superselectively of the left facial artery nasoseptal branch until there was stasis with reflux.  The microcatheter was then retrieved. A control arteriogram performed through the left external carotid artery demonstrated stasis within the embolized blood vessels as described.  With the guide catheter in the left common carotid artery, no change was seen in the intracranial circulation as described previously. Importantly no evidence of dissections, or intraluminal filling defects was seen. The 6 [place] guide catheter and the 6 French neurovascular sheath was then retrieved into the abdominal aorta and exchange over a J-tip guidewire for a 6 [place] Pinnacle sheath. This was then successfully retrieved with the application of an external closure device. The patient received 3000 units of IV heparin achieving an ACT of around 170-180 seconds.  The patient's general anesthesia was then reversed and the patient was slowly extubated in the neuro ICU. Upon recovery, the patient demonstrated no new neurological signs or symptoms. During the procedure there was no evidence of bleeding from either nostril.  COMPLICATIONS: None immediate.  FINDINGS: As above.  IMPRESSION: Status post endovascular superselective embolization of external carotid artery branches bilaterally as described. PVA particles of sizes 250-355 microns, 355 microns to 500 microns, and 500-700 microns as described above, for recurrent epistaxis.   Electronically Signed   By: Luanne Bras M.D.   On: 07/17/2013 12:58   Ir Neuro Each Add'l After Basic Uni Right (ms)  07/17/2013   CLINICAL DATA:  Recurrent epistaxis.  EXAM: BILATERAL COMMON CAROTID AND INNOMINATE ANGIOGRAPHY, AND BILATERAL EXTERNAL CAROTID ARTERY ARTERIOGRAM FOLLOWED BY ENDOVASCULAR SUPERSELECTIVE EMBOLIZATION OF EXTERNAL CAROTID ARTERY BRANCHES FOR RECURRENT PERSISTENT EPISTAXIS:  ANESTHESIA/SEDATION: General  anesthesia.  MEDICATIONS: As per general anesthesia.  CONTRAST:  129mL OMNIPAQUE IOHEXOL 300 MG/ML SOLN  PROCEDURE: Following A full explanation of the procedure along with the potential associated complications, an informed witnessed consent was obtained. The patient was put under general anesthesia by the Department of Anesthesiology at [facility].  The right groin was prepped and draped in the usual sterile fashion. Thereafter using a modified Seldinger technique, transfemoral access into the right common femoral artery was obtained without difficulty. Over a 0.035 inch guidewire, a 5 [place] Pinnacle sheath was inserted. Through this, and also over a 0.035 inch guidewire, a 5 [place] JB1 catheter was advanced to the right common carotid artery. The guidewire was removed. Good aspiration was obtained from the hub of the, 5 [place] diagnostic catheter.  An arteriogram was performed centered over the right common carotid artery bifurcation and intracranially.  The right external carotid artery and its major branches appear to be widely patent. Abnormal prominence of the internal maxillary artery, the infraorbital artery and the descending palatine artery were seen.  No extravasation of contrast however was noted.  Right internal carotid artery at the bulb demonstrated smooth atherosclerotic plaque along the posterior wall without associated ulcerations  or calcifications. The right internal carotid artery was otherwise seen to opacify normally to the cranial skull base. The petrous, the cavernous and the supraclinoid segments opacified normally.  A right posterior communicating artery was seen to opacify the right posterior cerebral artery distribution.  The right middle and the right anterior cerebral artery were seen to opacify normally into the capillary and the venous phases.  SUPERSELECTIVE ENDOVASCULAR EMBOLIZATION OF EXTERNAL CAROTID ARTERY BRANCHES BILATERALLY USING PVA PARTICLES:  The diagnostic JB1  catheter in the right common carotid artery was then exchanged over a 0.035 inch 300 cm Rosen exchange guidewire for a 6 French 65 cm neurovascular Arrow sheath using biplane roadmap technique and constant fluoroscopic guidance. Good aspiration was obtained from the side port of the neurovascular sheath. A gentle contrast injection demonstrated no evidence of spasms, dissections or of intraluminal filling defects.  This was then connected to a continuous heparinized saline infusion.  Over the [employer] guidewire, a 6 French 90 cm BriteTip Envoy guide catheter was then advanced and positioned just proximal to the right common carotid bifurcation. The guidewire was removed. Good aspiration was obtained from the hub of the 6 [place] guide catheter.  Gentle contrast injection demonstrated no evidence of spasms, dissections or of intraluminal filling defects.  Using biplane roadmap technique and constant fluoroscopic guidance, the 6 French [facility] BriteTip Envoy guide catheter was then advanced to the proximal right external carotid artery using biplane roadmap technique and constant fluoroscopic guidance. The guidewire was removed. Good aspiration was obtained from the hub of the 6 [place] guide catheter. A gentle contrast injection demonstrated no evidence of spasms, dissections, or of intraluminal filling defects.  At this time using biplane roadmap technique and constant fluoroscopic guidance, in a coaxial manner and with constant heparinized saline infusion, a rapid transit 2 tip microcatheter which had been steam-shaped was then advanced over a 0.0140 inch Synchro micro guidewire to the distal end of the guide catheter.  With the micro guidewire leading with the J-tip configuration, the combination was navigated without difficulty to the internal maxillary artery and subsequently into the vessels just distal to the infraorbital artery.  The guidewire was removed. Good aspiration was obtained from the hub of the  microcatheter. Gentle contrast injection demonstrated safe position of the tip of the microcatheter for embolization again. No abnormal dangerous communications with the vertebral artery or with the internal carotid arteries were seen.  At this time using biplane roadmap technique, combination of 250-355 micron PVA particles, 355-500 micron PVA particles and 500-700 micron PVA particles mixed in a combination of 75% contrast and 25% heparinized saline infusion, were injected under biplane intermittent fluoroscopy until stasis in the vessels sampled. The first branch was the internal maxillary artery distal to the infraorbital artery, the infraorbital artery, the descending palatine artery, and the distal nasal septal branch of the facial artery.  Each of these vessels was entered with the micro guidewire using biplane intermittent fluoroscopy. Each of these blood vessels was then embolized superselectively under biplane intermittent fluoroscopy until there was stasis in the vessel of concern. Also prior to embolization, a gentle control arteriogram was performed through the micro catheter to ensure no pathological or dangerous communications with the internal carotid artery or the posterior circulation.  At the end of the embolization of all these vessels, a control arteriogram performed via the 6 [place] guide catheter in the right common carotid artery, demonstrated complete angiographic obliteration. Also no abnormal filling defects were seen within the intracranial circulation.  The a 6 [place] guide catheter was then positioned in the left common carotid artery as described above. An arteriogram was then performed seen intracranially which revealed the left internal carotid bifurcation and the left internal carotid artery to the cranial skull base to opacify.  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 This measured approximately  8 mm x 5 mm.  The petrous, the cavernous and the supraclinoid segments were also normal.  Normal opacification was seen of the left middle cerebral artery and left anterior cerebral artery into the capillary and the venous phases. Also noted was saccular shaped prominence in the left internal carotid artery terminus.  The left external carotid artery origin was normal.  Opacification of the branches of the external carotid artery appeared to be patent.  Again noted was abnormal prominence of the descending palatine branch of the internal maxillary artery distally.  The using biplane intermittent fluoroscopy, over a 0.035 inch Roadrunner guidewire, the 6 [place] guide catheter was positioned in the proximal left external carotid artery. The guidewire was removed. Good aspiration was obtained from the hub of the 6 [place] guide catheter. Using a biplane roadmap technique and constant fluoroscopic guidance, in a coaxial manner, and with constant heparinized saline infusion, rapid transit 2 tip microcatheter which had been steam-shaped was then advanced over a 0.014 inch Synchro soft micro guidewire and advanced with the micro guidewire leading with a J-tip configuration to the internal maxillary artery distal to the origin of the infraorbital artery.  The guidewire was removed. Good aspiration was obtained from the hub of the microcatheter. A gentle contrast injection demonstrated no evidence of spasm, dissections, or of intraluminal filling defects. No abnormal communications were seen with the vertebral of the intracranial circulation.  Again, PVA particles of sizes 250 microns to 355 microns, 355 microns to 500 microns, and 700 microns were then injected in this branch, the infraorbital artery and also the descending palatine artery. Similar embolization was then performed superselectively of the left facial artery nasoseptal branch until there was stasis with reflux.  The microcatheter was then retrieved. A control  arteriogram performed through the left external carotid artery demonstrated stasis within the embolized blood vessels as described.  With the guide catheter in the left common carotid artery, no change was seen in the intracranial circulation as described previously. Importantly no evidence of dissections, or intraluminal filling defects was seen. The 6 [place] guide catheter and the 6 French neurovascular sheath was then retrieved into the abdominal aorta and exchange over a J-tip guidewire for a 6 [place] Pinnacle sheath. This was then successfully retrieved with the application of an external closure device. The patient received 3000 units of IV heparin achieving an ACT of around 170-180 seconds.  The patient's general anesthesia was then reversed and the patient was slowly extubated in the neuro ICU. Upon recovery, the patient demonstrated no new neurological signs or symptoms. During the procedure there was no evidence of bleeding from either nostril.  COMPLICATIONS: None immediate.  FINDINGS: As above.  IMPRESSION: Status post endovascular superselective embolization of external carotid artery branches bilaterally as described. PVA particles of sizes 250-355 microns, 355 microns to 500 microns, and 500-700 microns as described above, for recurrent epistaxis.   Electronically Signed   By: Luanne Bras M.D.   On: 07/17/2013 12:57   Ir Neuro Each Add'l After Basic Uni Right (ms)  07/17/2013   CLINICAL DATA:  Recurrent epistaxis.  EXAM: BILATERAL COMMON CAROTID AND INNOMINATE ANGIOGRAPHY, AND BILATERAL EXTERNAL CAROTID ARTERY ARTERIOGRAM FOLLOWED BY ENDOVASCULAR SUPERSELECTIVE EMBOLIZATION OF EXTERNAL CAROTID ARTERY BRANCHES FOR RECURRENT PERSISTENT EPISTAXIS:  ANESTHESIA/SEDATION: General anesthesia.  MEDICATIONS: As per general anesthesia.  CONTRAST:  125mL OMNIPAQUE IOHEXOL 300 MG/ML SOLN  PROCEDURE: Following A full explanation of the procedure along with the potential associated complications, an  informed witnessed consent was obtained. The patient was put under general anesthesia by the Department of Anesthesiology at [facility].  The right groin was prepped and draped in the usual sterile fashion. Thereafter using a modified Seldinger technique, transfemoral access into the right common femoral artery was obtained without difficulty. Over a 0.035 inch guidewire, a 5 French Pinnacle sheath was inserted. Through this, and also over a 0.035 inch guidewire, a 5 French JB1 catheter was advanced to the right common carotid artery. The guidewire was removed. Good aspiration was obtained from the hub of the, 5 [place] diagnostic catheter.  An arteriogram was performed centered over the right common carotid artery bifurcation and intracranially.  The right external carotid artery and its major branches appear to be widely patent. Abnormal prominence of the internal maxillary artery, the infraorbital artery and the descending palatine artery were seen.  No extravasation of contrast however was noted.  Right internal carotid artery at the bulb demonstrated smooth atherosclerotic plaque along the posterior wall without associated ulcerations or calcifications. The right internal carotid artery was otherwise seen to opacify normally to the cranial skull base. The petrous, the cavernous and the supraclinoid segments opacified normally.  A right posterior communicating artery was seen to opacify the right posterior cerebral artery distribution.  The right middle and the right anterior cerebral artery were seen to opacify normally into the capillary and the venous phases.  SUPERSELECTIVE ENDOVASCULAR EMBOLIZATION OF EXTERNAL CAROTID ARTERY BRANCHES BILATERALLY USING PVA PARTICLES:  The diagnostic JB1 catheter in the right common carotid artery was then exchanged over a 0.035 inch 300 cm Rosen exchange guidewire for a 6 French 65 cm neurovascular Arrow sheath using biplane roadmap technique and constant  fluoroscopic guidance. Good aspiration was obtained from the side port of the neurovascular sheath. A gentle contrast injection demonstrated no evidence of spasms, dissections or of intraluminal filling defects.  This was then connected to a continuous heparinized saline infusion.  Over the [employer] guidewire, a 6 French 90 cm BriteTip Envoy guide catheter was then advanced and positioned just proximal to the right common carotid bifurcation. The guidewire was removed. Good aspiration was obtained from the hub of the 6 [place] guide catheter.  Gentle contrast injection demonstrated no evidence of spasms, dissections or of intraluminal filling defects.  Using biplane roadmap technique and constant fluoroscopic guidance, the 6 French [facility] BriteTip Envoy guide catheter was then advanced to the proximal right external carotid artery using biplane roadmap technique and constant fluoroscopic guidance. The guidewire was removed. Good aspiration was obtained from the hub of the 6 [place] guide catheter. A gentle contrast injection demonstrated no evidence of spasms, dissections, or of intraluminal filling defects.  At this time using biplane roadmap technique and constant fluoroscopic guidance, in a coaxial manner and with constant heparinized saline infusion, a rapid transit 2 tip microcatheter which had been steam-shaped was then advanced over a 0.0140 inch Synchro micro guidewire to the distal end of the guide catheter.  With the micro guidewire leading with the J-tip configuration, the combination was navigated without difficulty to the internal maxillary artery and subsequently into the  vessels just distal to the infraorbital artery.  The guidewire was removed. Good aspiration was obtained from the hub of the microcatheter. Gentle contrast injection demonstrated safe position of the tip of the microcatheter for embolization again. No abnormal dangerous communications with the vertebral artery or with the internal  carotid arteries were seen.  At this time using biplane roadmap technique, combination of 250-355 micron PVA particles, 355-500 micron PVA particles and 500-700 micron PVA particles mixed in a combination of 75% contrast and 25% heparinized saline infusion, were injected under biplane intermittent fluoroscopy until stasis in the vessels sampled. The first branch was the internal maxillary artery distal to the infraorbital artery, the infraorbital artery, the descending palatine artery, and the distal nasal septal branch of the facial artery.  Each of these vessels was entered with the micro guidewire using biplane intermittent fluoroscopy. Each of these blood vessels was then embolized superselectively under biplane intermittent fluoroscopy until there was stasis in the vessel of concern. Also prior to embolization, a gentle control arteriogram was performed through the micro catheter to ensure no pathological or dangerous communications with the internal carotid artery or the posterior circulation.  At the end of the embolization of all these vessels, a control arteriogram performed via the 6 [place] guide catheter in the right common carotid artery, demonstrated complete angiographic obliteration. Also no abnormal filling defects were seen within the intracranial circulation.  The a 6 [place] guide catheter was then positioned in the left common carotid artery as described above. An arteriogram was then performed seen intracranially which revealed the left internal carotid bifurcation and the left internal carotid artery to the cranial skull base to opacify.  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 This measured approximately 8 mm x 5 mm.  The petrous, the cavernous and the supraclinoid segments were also normal.  Normal opacification was seen of the left middle cerebral artery and left anterior cerebral artery into the capillary  and the venous phases. Also noted was saccular shaped prominence in the left internal carotid artery terminus.  The left external carotid artery origin was normal.  Opacification of the branches of the external carotid artery appeared to be patent.  Again noted was abnormal prominence of the descending palatine branch of the internal maxillary artery distally.  The using biplane intermittent fluoroscopy, over a 0.035 inch Roadrunner guidewire, the 6 [place] guide catheter was positioned in the proximal left external carotid artery. The guidewire was removed. Good aspiration was obtained from the hub of the 6 [place] guide catheter. Using a biplane roadmap technique and constant fluoroscopic guidance, in a coaxial manner, and with constant heparinized saline infusion, rapid transit 2 tip microcatheter which had been steam-shaped was then advanced over a 0.014 inch Synchro soft micro guidewire and advanced with the micro guidewire leading with a J-tip configuration to the internal maxillary artery distal to the origin of the infraorbital artery.  The guidewire was removed. Good aspiration was obtained from the hub of the microcatheter. A gentle contrast injection demonstrated no evidence of spasm, dissections, or of intraluminal filling defects. No abnormal communications were seen with the vertebral of the intracranial circulation.  Again, PVA particles of sizes 250 microns to 355 microns, 355 microns to 500 microns, and 700 microns were then injected in this branch, the infraorbital artery and also the descending palatine artery. Similar embolization was then performed superselectively of the left facial artery nasoseptal branch until there was  stasis with reflux.  The microcatheter was then retrieved. A control arteriogram performed through the left external carotid artery demonstrated stasis within the embolized blood vessels as described.  With the guide catheter in the left common carotid artery, no change was  seen in the intracranial circulation as described previously. Importantly no evidence of dissections, or intraluminal filling defects was seen. The 6 [place] guide catheter and the 6 French neurovascular sheath was then retrieved into the abdominal aorta and exchange over a J-tip guidewire for a 6 [place] Pinnacle sheath. This was then successfully retrieved with the application of an external closure device. The patient received 3000 units of IV heparin achieving an ACT of around 170-180 seconds.  The patient's general anesthesia was then reversed and the patient was slowly extubated in the neuro ICU. Upon recovery, the patient demonstrated no new neurological signs or symptoms. During the procedure there was no evidence of bleeding from either nostril.  COMPLICATIONS: None immediate.  FINDINGS: As above.  IMPRESSION: Status post endovascular superselective embolization of external carotid artery branches bilaterally as described. PVA particles of sizes 250-355 microns, 355 microns to 500 microns, and 500-700 microns as described above, for recurrent epistaxis.   Electronically Signed   By: Luanne Bras M.D.   On: 07/17/2013 12:56

## 2013-07-29 NOTE — Progress Notes (Signed)
Patient ID: Theodore [name], male   DOB: 03/24/1940, 73 y.o.   MRN: [id number]      SUBJECTIVE: The patient is a 73 year old male who was recently hospitalized for recurrent episodes of epistaxis. Hgb was reportedly as low as 7, for which he underwent a PRBC transfusion, with a resulting Hgb of 8.5. Warfarin was held.  He ultimately underwent endovascular superselective embolization of external carotid artery branches bilaterally to prevent recurrences.  He has a h/o chronic diastolic congestive heart failure and right heart failure. He also has severe COPD with severe pulmonary hypertension and grade 2 diastolic dysfunction and atrial fibrillation. Discharge weight 67.9 kg.  On 5/5, Hgb 8.9, INR 1.16, BUN 31, creat 1.28.  He followed up with Dr. Moshe Cipro (PCP) afterwards, and he was short of breath with a rapid heart rate. BP was elevated. CXR ordered which showed emphysematous changes.  His chronic dyspnea with exertion is no worse than his baseline. He denies chest pain and palpitations. He has had no further nosebleeds.  He was discharged on long-acting diltiazem, metoprolol, and hydralazine, but he appears to be confused about his meds today as he did not bring in any of these bottles, yet his HR is controlled.  He does receive home health.     Allergies  Allergen Reactions  . Aspirin Other (See Comments)    On coumadin    Current Outpatient Prescriptions  Medication Sig Dispense Refill  . albuterol (PROAIR HFA) 108 (90 BASE) MCG/ACT inhaler Inhale 2 puffs into the lungs every 6 (six) hours as needed for wheezing or shortness of breath.  6.7 g  4  . albuterol (PROVENTIL) (2.5 MG/3ML) 0.083% nebulizer solution INHALE 1 VIAL VIA NEBULIZER EVERY 6 HOURS AS NEEDED  375 mL  0  . allopurinol (ZYLOPRIM) 300 MG tablet Take 1 tablet (300 mg total) by mouth daily.  30 tablet  6  . ALPRAZolam (XANAX) 0.25 MG tablet TAKE 1 TABLET BY MOUTH TWICE DAILY AS NEEDED  28 tablet  0  .  brimonidine-timolol (COMBIGAN) 0.2-0.5 % ophthalmic solution Place 1 drop into both eyes every 12 (twelve) hours.  5 mL  0  . budesonide-formoterol (SYMBICORT) 160-4.5 MCG/ACT inhaler Inhale 2 puffs into the lungs 2 (two) times daily.  1 Inhaler  4  . diltiazem (CARDIZEM CD) 180 MG 24 hr capsule Take 180 mg by mouth daily. *Patient states that as of 07/08/2013 he was advised to STOP this medication      . ferrous sulfate 325 (65 FE) MG tablet Take 325 mg by mouth 2 (two) times daily.      Marland Kitchen HYDROcodone-acetaminophen (NORCO/VICODIN) 5-325 MG per tablet Take 2 tablets by mouth every 4 (four) hours as needed.  6 tablet  0  . montelukast (SINGULAIR) 10 MG tablet Take 10 mg by mouth at bedtime.      . potassium chloride (K-DUR) 10 MEQ tablet Take 10 mEq by mouth 2 (two) times daily.      Marland Kitchen SPIRIVA HANDIHALER 18 MCG inhalation capsule INHALE THE CONTENTS OF 1 CAPSULE VIA HANDIHALER EVERY DAY  30 capsule  0  . temazepam (RESTORIL) 15 MG capsule TAKE 1 CAPSULE BY MOUTH EVERY DAY AT BEDTIME AS NEEDED FOR SLEEP  30 capsule  0  . torsemide (DEMADEX) 20 MG tablet Take 2 tablets (40 mg total) by mouth daily.  60 tablet  0  . travoprost, benzalkonium, (TRAVATAN Z) 0.004 % ophthalmic solution Place 1 drop into both eyes at bedtime.  No current facility-administered medications for this visit.    Past Medical History  Diagnosis Date  . Anemia     Hemoglobin 11.6 in 04/2008 -> resolved   . Tobacco abuse   . Glaucoma   . Allergic rhinitis   . Atrial fibrillation     Coumadin  . Diastolic heart failure     LVEF 91-47%, rate 2 diastolic dysfunction 10/2954  . Pulmonary hypertension     70 mmHg 03/2012  . Shingles   . Essential hypertension, benign   . COPD (chronic obstructive pulmonary disease)   . Asthma     Past Surgical History  Procedure Laterality Date  . Bilateral cataract surgery    . Herniorrhapy    . Tendon repair      Right hand surgical procedure for a tendon repair  . Colonoscopy  N/A 11/29/2012    Procedure: COLONOSCOPY;  Surgeon: Danie Binder, MD;  Location: [facility];  Service: Endoscopy;  Laterality: N/A;  1:00  . Esophagogastroduodenoscopy N/A 11/29/2012    Procedure: ESOPHAGOGASTRODUODENOSCOPY (EGD);  Surgeon: Danie Binder, MD;  Location: [facility];  Service: Endoscopy;  Laterality: N/A;  . Eye surgery    . Hernia repair    . Radiology with anesthesia N/A 07/10/2013    Procedure: EMBOLIZATION-RADIOLOGY WITH ANESTHESIA;  Surgeon: Rob Hickman, MD;  Location: [facility];  Service: Radiology;  Laterality: N/A;    History   Social History  . Marital Status: Widowed    Spouse Name: N/A    Number of Children: 2  . Years of Education: N/A   Occupational History  . Retired   .     Social History Main Topics  . Smoking status: Current Some Day Smoker -- 0.20 packs/day for 55 years    Types: Cigarettes    Last Attempt to Quit: 02/13/2012  . Smokeless tobacco: Never Used     Comment: occasionally smokes  . Alcohol Use: No  . Drug Use: No  . Sexual Activity: No   Other Topics Concern  . Not on file   Social History Narrative  . No narrative on file    BP 142/82  Pulse 58    PHYSICAL EXAM General: NAD Neck: No JVD, no thyromegaly. Lungs: Clear to auscultation bilaterally with normal respiratory effort. CV: Nondisplaced PMI. Irregular rhythm, normal rate, normal S1/S2, no S3, no murmur. No pretibial or periankle edema.   Abdomen: Soft, nontender, no hepatosplenomegaly, no distention.  Neurologic: Alert and oriented x 3.  Psych: Normal affect. Extremities: No clubbing or cyanosis.   ECG: reviewed and available in electronic records.      ASSESSMENT AND PLAN: 1. Chronic diastolic heart failure and right heart failure: Euvolemic today. Continue torsemide 40 mg daily.  2. NSVT and chest pain: This occurred in a prior hospitalization, and due to his hospitalization for epistaxis, he never underwent stress testing. Will obtain a  Lexiscan Cardiolite stress test to assess ischemic burden. 3. HTN: Reasonable today. Will make certain he is taking hydralazine 25 mg tid.  4. Atrial fibrillation: Rate is controlled, but it is unclear if he is actually taking diltiazem and metoprolol. We will confer with home health. Will have his BP and HR checked by them and will ask them to inform us. Given no further nosebleeds, will resume warfarin with monitoring in our anticoagulation clinic.   Dispo: f/u 3 months.   Kate Sable, M.D., F.A.C.C.

## 2013-07-30 ENCOUNTER — Other Ambulatory Visit: Payer: Self-pay | Admitting: *Deleted

## 2013-07-30 ENCOUNTER — Telehealth: Payer: Self-pay | Admitting: *Deleted

## 2013-07-30 DIAGNOSIS — I4891 Unspecified atrial fibrillation: Secondary | ICD-10-CM

## 2013-07-30 NOTE — Telephone Encounter (Signed)
Order for PT/INR faxed to Janalyn Shy RN

## 2013-07-30 NOTE — Telephone Encounter (Signed)
Message copied by Malen Gauze on Wed Jul 30, 2013  1:43 PM ------      Message from: Clerance Lav      Created: Tue Jul 29, 2013  5:12 PM       Veva Holes,             I talked with Tye Maryland at the office and reviewed Dr. Marthann Schiller note. I'll see Mr. Huster tomorrow and have him restart his Coumadin 4mg  daily. I'll check INR next week on Wednesday and have [facility] send stat results to you. If you wouldn't mind faxing an order to me @ [phone], I'd appreciate it.             Thanks,       Delrae Rend             ----- Message -----         From: Malen Gauze, RN         Sent: 07/29/2013   2:02 PM           To: Army Melia, RN            Alisa,      Please review Dr [name] office note from today.  Please have Mr Tashiro restart his Warfarin @ 4mg  daily and recheck his INR in 1 week.      Thanks,       Lattie Haw      Please let me know if you can do this so I don't have to bring him in the office.       ------

## 2013-08-05 ENCOUNTER — Ambulatory Visit (HOSPITAL_COMMUNITY): Admission: RE | Admit: 2013-08-05 | Payer: Medicare Other | Source: Ambulatory Visit

## 2013-08-05 ENCOUNTER — Encounter (HOSPITAL_COMMUNITY): Payer: Medicare Other

## 2013-08-05 ENCOUNTER — Ambulatory Visit: Payer: Medicare Other | Admitting: Cardiovascular Disease

## 2013-08-06 LAB — POCT INR: INR: 1.51

## 2013-08-07 ENCOUNTER — Ambulatory Visit: Payer: Medicare Other | Admitting: Cardiovascular Disease

## 2013-08-07 ENCOUNTER — Ambulatory Visit (INDEPENDENT_AMBULATORY_CARE_PROVIDER_SITE_OTHER): Payer: Medicare Other | Admitting: *Deleted

## 2013-08-07 DIAGNOSIS — Z5181 Encounter for therapeutic drug level monitoring: Secondary | ICD-10-CM

## 2013-08-07 DIAGNOSIS — I4891 Unspecified atrial fibrillation: Secondary | ICD-10-CM

## 2013-08-08 ENCOUNTER — Encounter (HOSPITAL_COMMUNITY): Payer: Self-pay

## 2013-08-08 ENCOUNTER — Encounter (HOSPITAL_COMMUNITY)
Admission: RE | Admit: 2013-08-08 | Discharge: 2013-08-08 | Disposition: A | Discharge status: Home / Self Care | Payer: Medicare Other | Source: Ambulatory Visit | Attending: Cardiovascular Disease | Admitting: Cardiovascular Disease

## 2013-08-08 ENCOUNTER — Ambulatory Visit (HOSPITAL_COMMUNITY)
Admission: RE | Admit: 2013-08-08 | Discharge: 2013-08-08 | Disposition: A | Discharge status: Home / Self Care | Payer: Medicare Other | Source: Ambulatory Visit | Attending: Cardiovascular Disease | Admitting: Cardiovascular Disease

## 2013-08-08 DIAGNOSIS — I4891 Unspecified atrial fibrillation: Secondary | ICD-10-CM | POA: Insufficient documentation

## 2013-08-08 DIAGNOSIS — R079 Chest pain, unspecified: Secondary | ICD-10-CM | POA: Insufficient documentation

## 2013-08-08 DIAGNOSIS — I472 Ventricular tachycardia, unspecified: Secondary | ICD-10-CM | POA: Insufficient documentation

## 2013-08-08 DIAGNOSIS — I4729 Other ventricular tachycardia: Secondary | ICD-10-CM | POA: Insufficient documentation

## 2013-08-08 MED ORDER — TECHNETIUM TC 99M SESTAMIBI - CARDIOLITE
30.0000 | Freq: Once | INTRAVENOUS | Status: AC | PRN
  Administered 2013-08-08: 11:00:00 30 via INTRAVENOUS

## 2013-08-08 MED ORDER — SODIUM CHLORIDE 0.9 % IJ SOLN
INTRAMUSCULAR | Status: AC
  Administered 2013-08-08: 10 mL via INTRAVENOUS
  Filled 2013-08-08: qty 10

## 2013-08-08 MED ORDER — TECHNETIUM TC 99M SESTAMIBI GENERIC - CARDIOLITE
10.0000 | Freq: Once | INTRAVENOUS | Status: AC | PRN
  Administered 2013-08-08: 10 via INTRAVENOUS

## 2013-08-08 MED ORDER — REGADENOSON 0.4 MG/5ML IV SOLN
INTRAVENOUS | Status: AC
  Administered 2013-08-08: 0.4 mg via INTRAVENOUS
  Filled 2013-08-08: qty 5

## 2013-08-08 NOTE — Progress Notes (Signed)
St[facility] Lab Nurses Notes - Theodore [name] [place]ham 08/08/2013 Reason for doing test: AFib Type of test: [name] Nurse performing test: Gerrit Halls, RN Nuclear Medicine Tech: Melburn Hake Echo Tech: Not Applicable MD performing test: S. McDowell/K.Lawrence NP Family MD: Moshe Cipro Test explained and consent signed: yes IV started: 22g jelco, Saline lock flushed, No redness or edema and Saline lock started in radiology Symptoms: SOB Treatment/Intervention: None Reason test stopped: protocol completed After recovery IV was: Discontinued via X-ray tech and No redness or edema Patient to return to [place]. Med at : 11:35 Patient discharged: Home Patient's Condition upon discharge was: stable Comments: During test BP 139/82 & HR 100.  Recovery BP 136/75 & HR 89.  Symptoms resolved in recovery. [name]

## 2013-08-11 ENCOUNTER — Other Ambulatory Visit: Payer: Self-pay | Admitting: *Deleted

## 2013-08-11 DIAGNOSIS — I4891 Unspecified atrial fibrillation: Secondary | ICD-10-CM

## 2013-08-12 ENCOUNTER — Encounter: Payer: Self-pay | Admitting: Family Medicine

## 2013-08-14 ENCOUNTER — Telehealth: Payer: Self-pay | Admitting: Cardiovascular Disease

## 2013-08-14 ENCOUNTER — Other Ambulatory Visit: Payer: Self-pay | Admitting: Family Medicine

## 2013-08-14 LAB — POCT INR: INR: 2.5

## 2013-08-14 MED ORDER — TORSEMIDE 20 MG PO TABS: 40.0000 mg | ORAL_TABLET | Freq: Every day | ORAL | Status: DC

## 2013-08-14 NOTE — Telephone Encounter (Signed)
Medication sent via escribe.  

## 2013-08-14 NOTE — Telephone Encounter (Signed)
Received fax refill request  Rx # [id number] Medication:  Torsemide 20 mg tablets Qty 60 Sig:  Take 2 tablets by mouth daily Physician:  Bronson Ing

## 2013-08-19 ENCOUNTER — Ambulatory Visit (INDEPENDENT_AMBULATORY_CARE_PROVIDER_SITE_OTHER): Payer: Medicare Other | Admitting: *Deleted

## 2013-08-19 DIAGNOSIS — Z5181 Encounter for therapeutic drug level monitoring: Secondary | ICD-10-CM

## 2013-08-19 DIAGNOSIS — I4891 Unspecified atrial fibrillation: Secondary | ICD-10-CM

## 2013-08-21 LAB — PROTIME-INR: INR: 1.3 — AB (ref 0.9–1.1)

## 2013-08-25 ENCOUNTER — Other Ambulatory Visit: Payer: Self-pay

## 2013-08-25 ENCOUNTER — Other Ambulatory Visit: Payer: Self-pay | Admitting: Family Medicine

## 2013-08-25 ENCOUNTER — Telehealth: Payer: Self-pay

## 2013-08-25 MED ORDER — DILTIAZEM HCL ER COATED BEADS 180 MG PO CP24: 180.0000 mg | ORAL_CAPSULE | Freq: Every day | ORAL | Status: DC

## 2013-08-25 MED ORDER — METOPROLOL TARTRATE 50 MG PO TABS: 50.0000 mg | ORAL_TABLET | Freq: Two times a day (BID) | ORAL | Status: DC

## 2013-08-25 MED ORDER — HYDRALAZINE HCL 25 MG PO TABS: 25.0000 mg | ORAL_TABLET | Freq: Three times a day (TID) | ORAL | Status: DC

## 2013-08-25 NOTE — Telephone Encounter (Signed)
meds refilled 

## 2013-08-27 ENCOUNTER — Ambulatory Visit (INDEPENDENT_AMBULATORY_CARE_PROVIDER_SITE_OTHER): Payer: Medicare Other | Admitting: *Deleted

## 2013-08-27 DIAGNOSIS — Z5181 Encounter for therapeutic drug level monitoring: Secondary | ICD-10-CM

## 2013-08-27 DIAGNOSIS — I4891 Unspecified atrial fibrillation: Secondary | ICD-10-CM

## 2013-09-01 ENCOUNTER — Ambulatory Visit (INDEPENDENT_AMBULATORY_CARE_PROVIDER_SITE_OTHER): Payer: Medicare Other | Admitting: *Deleted

## 2013-09-01 DIAGNOSIS — I4891 Unspecified atrial fibrillation: Secondary | ICD-10-CM

## 2013-09-01 DIAGNOSIS — I480 Paroxysmal atrial fibrillation: Secondary | ICD-10-CM

## 2013-09-01 DIAGNOSIS — I472 Ventricular tachycardia: Secondary | ICD-10-CM

## 2013-09-01 DIAGNOSIS — Z5181 Encounter for therapeutic drug level monitoring: Secondary | ICD-10-CM

## 2013-09-01 DIAGNOSIS — R079 Chest pain, unspecified: Secondary | ICD-10-CM

## 2013-09-01 DIAGNOSIS — I4729 Other ventricular tachycardia: Secondary | ICD-10-CM

## 2013-09-01 LAB — POCT INR: INR: 1.6

## 2013-09-02 ENCOUNTER — Telehealth: Payer: Self-pay | Admitting: *Deleted

## 2013-09-02 MED ORDER — WARFARIN SODIUM 4 MG PO TABS: ORAL_TABLET | ORAL | Status: DC

## 2013-09-02 NOTE — Telephone Encounter (Signed)
Pt needs warfarin called in to [facility] in [place] today he only has one pill left

## 2013-09-08 ENCOUNTER — Ambulatory Visit (INDEPENDENT_AMBULATORY_CARE_PROVIDER_SITE_OTHER): Payer: Medicare Other | Admitting: *Deleted

## 2013-09-08 DIAGNOSIS — I4891 Unspecified atrial fibrillation: Secondary | ICD-10-CM

## 2013-09-08 DIAGNOSIS — I472 Ventricular tachycardia: Secondary | ICD-10-CM

## 2013-09-08 DIAGNOSIS — Z5181 Encounter for therapeutic drug level monitoring: Secondary | ICD-10-CM

## 2013-09-08 DIAGNOSIS — I4729 Other ventricular tachycardia: Secondary | ICD-10-CM

## 2013-09-08 DIAGNOSIS — I482 Chronic atrial fibrillation, unspecified: Secondary | ICD-10-CM

## 2013-09-08 DIAGNOSIS — R079 Chest pain, unspecified: Secondary | ICD-10-CM

## 2013-09-08 LAB — POCT INR: INR: 2.3

## 2013-09-15 ENCOUNTER — Other Ambulatory Visit: Payer: Self-pay

## 2013-09-15 MED ORDER — TRAMADOL HCL 50 MG PO TABS: 50.0000 mg | ORAL_TABLET | Freq: Every day | ORAL | Status: DC | PRN

## 2013-09-15 NOTE — Telephone Encounter (Signed)
Med refilled.

## 2013-09-15 NOTE — Telephone Encounter (Signed)
Pt called stating he needs his pain pills that he is hurting. Please advise and per pt he needs them today.

## 2013-09-22 ENCOUNTER — Ambulatory Visit (INDEPENDENT_AMBULATORY_CARE_PROVIDER_SITE_OTHER): Payer: Medicare Other | Admitting: *Deleted

## 2013-09-22 ENCOUNTER — Other Ambulatory Visit: Payer: Self-pay | Admitting: *Deleted

## 2013-09-22 DIAGNOSIS — I4891 Unspecified atrial fibrillation: Secondary | ICD-10-CM

## 2013-09-22 DIAGNOSIS — I4729 Other ventricular tachycardia: Secondary | ICD-10-CM

## 2013-09-22 DIAGNOSIS — R079 Chest pain, unspecified: Secondary | ICD-10-CM

## 2013-09-22 DIAGNOSIS — I472 Ventricular tachycardia: Secondary | ICD-10-CM

## 2013-09-22 DIAGNOSIS — Z5181 Encounter for therapeutic drug level monitoring: Secondary | ICD-10-CM

## 2013-09-22 LAB — POCT INR: INR: 1.7

## 2013-09-22 MED ORDER — DILTIAZEM HCL ER COATED BEADS 180 MG PO CP24: 180.0000 mg | ORAL_CAPSULE | Freq: Every day | ORAL | Status: AC

## 2013-09-23 ENCOUNTER — Other Ambulatory Visit: Payer: Self-pay

## 2013-09-23 MED ORDER — TEMAZEPAM 15 MG PO CAPS: ORAL_CAPSULE | ORAL | Status: DC

## 2013-09-26 ENCOUNTER — Other Ambulatory Visit (HOSPITAL_COMMUNITY): Payer: Self-pay | Admitting: Interventional Radiology

## 2013-09-26 DIAGNOSIS — R040 Epistaxis: Secondary | ICD-10-CM

## 2013-09-29 ENCOUNTER — Encounter: Payer: Self-pay | Admitting: Family Medicine

## 2013-09-29 ENCOUNTER — Ambulatory Visit (INDEPENDENT_AMBULATORY_CARE_PROVIDER_SITE_OTHER): Payer: Medicare Other | Admitting: Family Medicine

## 2013-09-29 VITALS — BP 120/70 | HR 94 | Resp 16 | Wt 142.1 lb

## 2013-09-29 DIAGNOSIS — F17200 Nicotine dependence, unspecified, uncomplicated: Secondary | ICD-10-CM

## 2013-09-29 DIAGNOSIS — H409 Unspecified glaucoma: Secondary | ICD-10-CM

## 2013-09-29 DIAGNOSIS — F341 Dysthymic disorder: Secondary | ICD-10-CM

## 2013-09-29 DIAGNOSIS — R7989 Other specified abnormal findings of blood chemistry: Secondary | ICD-10-CM

## 2013-09-29 DIAGNOSIS — F418 Other specified anxiety disorders: Secondary | ICD-10-CM

## 2013-09-29 DIAGNOSIS — M109 Gout, unspecified: Secondary | ICD-10-CM

## 2013-09-29 DIAGNOSIS — D649 Anemia, unspecified: Secondary | ICD-10-CM

## 2013-09-29 DIAGNOSIS — J449 Chronic obstructive pulmonary disease, unspecified: Secondary | ICD-10-CM

## 2013-09-29 DIAGNOSIS — Z23 Encounter for immunization: Secondary | ICD-10-CM

## 2013-09-29 DIAGNOSIS — I10 Essential (primary) hypertension: Secondary | ICD-10-CM

## 2013-09-29 DIAGNOSIS — E790 Hyperuricemia without signs of inflammatory arthritis and tophaceous disease: Secondary | ICD-10-CM

## 2013-09-29 DIAGNOSIS — Z720 Tobacco use: Secondary | ICD-10-CM

## 2013-09-29 LAB — CBC WITH DIFFERENTIAL/PLATELET
Basophils Absolute: 0.1 10*3/uL (ref 0.0–0.1)
Basophils Relative: 1 % (ref 0–1)
EOS ABS: 0.2 10*3/uL (ref 0.0–0.7)
EOS PCT: 3 % (ref 0–5)
HCT: 35.7 % — ABNORMAL LOW (ref 39.0–52.0)
HEMOGLOBIN: 10.9 g/dL — AB (ref 13.0–17.0)
LYMPHS ABS: 1.5 10*3/uL (ref 0.7–4.0)
Lymphocytes Relative: 26 % (ref 12–46)
MCH: 26.2 pg (ref 26.0–34.0)
MCHC: 30.5 g/dL (ref 30.0–36.0)
MCV: 85.8 fL (ref 78.0–100.0)
MONOS PCT: 21 % — AB (ref 3–12)
Monocytes Absolute: 1.2 10*3/uL — ABNORMAL HIGH (ref 0.1–1.0)
Neutro Abs: 2.7 10*3/uL (ref 1.7–7.7)
Neutrophils Relative %: 49 % (ref 43–77)
Platelets: 317 10*3/uL (ref 150–400)
RBC: 4.16 MIL/uL — ABNORMAL LOW (ref 4.22–5.81)
RDW: 16.9 % — ABNORMAL HIGH (ref 11.5–15.5)
WBC: 5.6 10*3/uL (ref 4.0–10.5)

## 2013-09-29 LAB — COMPLETE METABOLIC PANEL WITH GFR
ALBUMIN: 3.6 g/dL (ref 3.5–5.2)
ALT: <8 U/L (ref 0–53)
AST: 17 U/L (ref 0–37)
Alkaline Phosphatase: 117 U/L (ref 39–117)
BUN: 10 mg/dL (ref 6–23)
CALCIUM: 9.2 mg/dL (ref 8.4–10.5)
CHLORIDE: 94 meq/L — AB (ref 96–112)
CO2: 38 meq/L — AB (ref 19–32)
Creat: 1.07 mg/dL (ref 0.50–1.35)
GFR, EST AFRICAN AMERICAN: 79 mL/min
GFR, EST NON AFRICAN AMERICAN: 68 mL/min
GLUCOSE: 58 mg/dL — AB (ref 70–99)
POTASSIUM: 3.9 meq/L (ref 3.5–5.3)
Sodium: 140 mEq/L (ref 135–145)
Total Bilirubin: 0.6 mg/dL (ref 0.2–1.2)
Total Protein: 7.7 g/dL (ref 6.0–8.3)

## 2013-09-29 LAB — IRON: Iron: 38 ug/dL — ABNORMAL LOW (ref 42–165)

## 2013-09-29 LAB — URIC ACID: Uric Acid, Serum: 4.4 mg/dL (ref 4.0–7.8)

## 2013-09-29 MED ORDER — ALPRAZOLAM 0.25 MG PO TABS: ORAL_TABLET | ORAL | Status: DC

## 2013-09-29 NOTE — Progress Notes (Signed)
   Subjective:    Patient ID: Theodore [name], male    DOB: Jul 07, 1940, 73 y.o.   MRN: [id number]  HPI The PT is here for follow up and re-evaluation of chronic medical conditions, medication management and review of any available recent lab and radiology data.  Preventive health is updated, specifically  Cancer screening and Immunization.   Questions or concerns regarding consultations or procedures which the PT has had in the interim are  Addressed.Needs to get back with urology re elevated PSA The PT denies any adverse reactions to current medications since the last visit.  There are no new concerns.  There are no specific complaints       Review of Systems See HPI Denies recent fever or chills. Denies sinus pressure, nasal congestion, ear pain or sore throat. Denies chest congestion, chronic smoker's cough and  wheezing. Denies chest pains, palpitations and leg swelling Denies abdominal pain, nausea, vomiting,diarrhea or constipation.   Denies dysuria, frequency, hesitancy or incontinence. Denies joint pain, swelling and limitation in mobility. Denies headaches, seizures, numbness, or tingling. Denies uncontrolled  depression, anxiety or insomnia. Denies skin break down or rash.        Objective:   Physical Exam BP 120/70  Pulse 94  Resp 16  Wt 142 lb 1.9 oz (64.465 kg)  SpO2 89% Patient alert and oriented and in no cardiopulmonary distress.  HEENT: No facial asymmetry, EOMI,   oropharynx pink and moist.  Neck supple no JVD, no mass.  Chest: Markedly reduced air entry bilaterally.Bilateral wheezes,no crackles CVS: S1, S2 no murmurs, no S3.Regular rate.  ABD: Soft non tender.   Ext: No edema  MS: Adequate ROM spine, shoulders, hips and knees.  Skin: Intact, no ulcerations or rash noted.  Psych: Good eye contact, normal affect. Memory intact not anxious or depressed appearing.  CNS: CN 2-12 intact, power,  normal throughout.no focal deficits noted.        Assessment & Plan:  Essential hypertension Controlled, no change in medication   Chronic diastolic CHF (congestive heart failure) No symptoms or signs of decompensation at this time Pt to continue salt and water restriction  COPD (chronic obstructive pulmonary disease) Needs supplemental oxygen 24 hrs per day , but continues to resist using this  Glaucoma Importance of regular usre of eye drops and follow up is streessed  Hyperuricemia Daily allopurinol keeps this controlled , hence reducing gout flares, continue same  Tobacco abuse disorder Still at 3 per day, unwilling to set a quit date but working on thjis Patient counseled for approximately 5 minutes regarding the health risks of ongoing nicotine use, specifically all types of cancer, heart disease, stroke and respiratory failure. The options available for help with cessation ,the behavioral changes to assist the process, and the option to either gradully reduce usage  Or abruptly stop.is also discussed. Pt is also encouraged to set specific goals in number of cigarettes used daily, as well as to set a quit date.   Need for vaccination with 13-polyvalent pneumococcal conjugate vaccine Vaccine administered at visit.   Depression with anxiety Good response to remron continue same, improved sleep  And appetite

## 2013-09-29 NOTE — Patient Instructions (Addendum)
F/u in 3 month, call if you need me before  Prevnar today   CBC, iron and ferritin today, chem 7 and EGFr today, uric acid today  We will follow up on the Doc you need to see for your prostate  Congrats getting down to 3 ciggs, plan to stop on your birthday  You will feel better if you start getting around with your oxygen, you need it all the time

## 2013-09-30 LAB — FERRITIN: Ferritin: 129 ng/mL (ref 22–322)

## 2013-10-02 ENCOUNTER — Telehealth: Payer: Self-pay | Admitting: *Deleted

## 2013-10-02 MED ORDER — TORSEMIDE 20 MG PO TABS: 40.0000 mg | ORAL_TABLET | Freq: Every day | ORAL | Status: DC

## 2013-10-02 NOTE — Telephone Encounter (Signed)
RX REFILL FOR TORSEMIDE 20 MG #60 2 TAB DAILY  [facility]

## 2013-10-03 ENCOUNTER — Encounter (HOSPITAL_COMMUNITY): Payer: Self-pay

## 2013-10-03 ENCOUNTER — Ambulatory Visit (HOSPITAL_COMMUNITY)
Admission: RE | Admit: 2013-10-03 | Discharge: 2013-10-03 | Disposition: A | Discharge status: Home / Self Care | Payer: Medicare Other | Source: Ambulatory Visit | Attending: Interventional Radiology | Admitting: Interventional Radiology

## 2013-10-03 DIAGNOSIS — R040 Epistaxis: Secondary | ICD-10-CM | POA: Insufficient documentation

## 2013-10-03 LAB — POCT I-STAT CREATININE: Creatinine, Ser: 1.1 mg/dL (ref 0.50–1.35)

## 2013-10-03 MED ORDER — IOHEXOL 350 MG/ML SOLN
100.0000 mL | Freq: Once | INTRAVENOUS | Status: AC | PRN
  Administered 2013-10-03: 100 mL via INTRAVENOUS

## 2013-10-07 ENCOUNTER — Telehealth: Payer: Self-pay | Admitting: Family Medicine

## 2013-10-07 ENCOUNTER — Telehealth (HOSPITAL_COMMUNITY): Payer: Self-pay | Admitting: Interventional Radiology

## 2013-10-07 LAB — POCT INR: INR: 3.57

## 2013-10-07 NOTE — Telephone Encounter (Signed)
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 pls discuss with his [facility] case manager, so that an arrangement can be made for him to come in asap, so that management of this can be started asap. He will need either oncology consult or further imaging studies, currently uncertain which first, depends on vist  And availability of tests

## 2013-10-07 NOTE — Telephone Encounter (Signed)
Called pt's PCP to notify her of the results of patient's recent CTA head and neck. She states that she will contact the patient and have him come in for an office visit to discuss these findings. JM

## 2013-10-07 NOTE — Telephone Encounter (Signed)
Message sent to Janalyn Shy to contact office.

## 2013-10-08 ENCOUNTER — Ambulatory Visit (INDEPENDENT_AMBULATORY_CARE_PROVIDER_SITE_OTHER): Payer: Medicare Other | Admitting: *Deleted

## 2013-10-08 DIAGNOSIS — Z5181 Encounter for therapeutic drug level monitoring: Secondary | ICD-10-CM

## 2013-10-08 DIAGNOSIS — I4891 Unspecified atrial fibrillation: Secondary | ICD-10-CM

## 2013-10-08 NOTE — Telephone Encounter (Signed)
Patient is coming in at 40 on Friday.

## 2013-10-08 NOTE — Telephone Encounter (Signed)
Called and left message for Theodore [name] to call the office.

## 2013-10-10 ENCOUNTER — Encounter: Payer: Self-pay | Admitting: Family Medicine

## 2013-10-10 ENCOUNTER — Ambulatory Visit (INDEPENDENT_AMBULATORY_CARE_PROVIDER_SITE_OTHER): Payer: Medicare Other | Admitting: Family Medicine

## 2013-10-10 VITALS — BP 130/68 | HR 89 | Resp 16 | Ht 67.0 in | Wt 144.4 lb

## 2013-10-10 DIAGNOSIS — R9389 Abnormal findings on diagnostic imaging of other specified body structures: Secondary | ICD-10-CM

## 2013-10-10 DIAGNOSIS — F418 Other specified anxiety disorders: Secondary | ICD-10-CM

## 2013-10-10 DIAGNOSIS — I509 Heart failure, unspecified: Secondary | ICD-10-CM

## 2013-10-10 DIAGNOSIS — I10 Essential (primary) hypertension: Secondary | ICD-10-CM

## 2013-10-10 DIAGNOSIS — F341 Dysthymic disorder: Secondary | ICD-10-CM

## 2013-10-10 DIAGNOSIS — F17200 Nicotine dependence, unspecified, uncomplicated: Secondary | ICD-10-CM

## 2013-10-10 NOTE — Assessment & Plan Note (Signed)
clinically stable and doing extremely well currently. Pt made some medication adjustments which have been working vey well for him.

## 2013-10-10 NOTE — Assessment & Plan Note (Signed)
Controlled, no change in medication  

## 2013-10-10 NOTE — Assessment & Plan Note (Signed)
Asymptomatic and well controlled on current regime

## 2013-10-10 NOTE — Assessment & Plan Note (Addendum)
Chronic nicotine use, over 30 pack yearhistory , with new abnormal findings on recent imaging study, need oncology evaluation and management . Pt understands, and he is willing to move forward with the best recommendations per the medical team' He understands there is no established diagnosis at this time, but there is a real concern that he may have lung cancer He will be  accompanied by [name] to the oncology appointment, the dept will schedule his appt by direct contact with her

## 2013-10-10 NOTE — Progress Notes (Signed)
   Subjective:    Patient ID: Theodore [name], male    DOB: 1940-07-29, 73 y.o.   MRN: [id number]  HPI Pt in today with his [place], to discuss the results of a recent  scan, of head and neck , which was done on 09/26/2013, and which demonstrated bilateral lung masses , which are considered highly suspicious for lung cancer. Theodore [name] has an over 30 pack year h/o nicotine use , and he still smokes He recently lost his wife earlier this year, and has been able to gradually improve his health,as far as cardio[pulmonary disease is concerned. He lives with his 2 sons, and relies heavily on the support of his [facility] 'angel", who he truly appreciates No h/o hemoptysis, and has recently had improvement in appetite with healthy weight gain , not due to fluid accumulation from  Decompensated CHF  Review of Systems See HPI No complaints by pt at this visit. He specifically denies symptoms consistent with decompensated heart failure No h/o hemoptysis    Objective:   Physical Exam BP 130/68  Pulse 89  Resp 16  Ht 5\' 7"  (1.702 m)  Wt 144 lb 6.4 oz (65.499 kg)  BMI 22.61 kg/m2  SpO2 95% Patient alert and oriented and in no cardiopulmonary distress at rest  HEENT: No facial asymmetry, EOMI,   oropharynx pink and moist.  Neck adeqaute ROM, no JVD, no mass.  Chest: Clear to auscultation bilaterally.Decreased though adequate air entry throughout  CVS: S1, S2 no murmurs, no S3.Regular rate.  ABD: Soft non tender.   Ext: No edema  MS: Adequate ROM spine, shoulders, hips and knees.  Skin: Intact, no ulcerations or rash noted.  Psych: Poor  eye contact, fro a while after learning of his new problem , slightly saddened  affect. Due to current new problem  CNS: CN 2-12 intact, power,  normal throughout.no focal deficits noted.        Assessment & Plan:  Abnormal CAT scan Chronic nicotine use, over 30 pack yearhistory , with new abnormal findings on recent imaging study, need  oncology evaluation and management . Pt understands, and he is willing to move forward with the best recommendations per the medical team' He understands there is no established diagnosis at this time, but there is a real concern that he may have lung cancer He will be  accompanied by [name] to the oncology appointment, the dept will schedule his appt by direct contact with her  HYPERTENSION Controlled, no change in medication   CHF (congestive heart failure) clinically stable and doing extremely well currently. Pt made some medication adjustments which have been working vey well for him.  Depression with anxiety Asymptomatic and well controlled on current regime

## 2013-10-10 NOTE — Patient Instructions (Signed)
F/u as before , call if you need me before  You are referred  To oncologist , we will call you with appointment

## 2013-10-17 ENCOUNTER — Ambulatory Visit (HOSPITAL_COMMUNITY)
Admission: RE | Admit: 2013-10-17 | Discharge: 2013-10-17 | Disposition: A | Discharge status: Home / Self Care | Payer: Medicare Other | Source: Ambulatory Visit | Attending: Diagnostic Radiology | Admitting: Diagnostic Radiology

## 2013-10-17 DIAGNOSIS — J984 Other disorders of lung: Secondary | ICD-10-CM | POA: Diagnosis not present

## 2013-10-17 DIAGNOSIS — I7090 Unspecified atherosclerosis: Secondary | ICD-10-CM | POA: Insufficient documentation

## 2013-10-17 DIAGNOSIS — J438 Other emphysema: Secondary | ICD-10-CM | POA: Diagnosis not present

## 2013-10-17 DIAGNOSIS — C3490 Malignant neoplasm of unspecified part of unspecified bronchus or lung: Secondary | ICD-10-CM | POA: Diagnosis present

## 2013-10-17 DIAGNOSIS — I2510 Atherosclerotic heart disease of native coronary artery without angina pectoris: Secondary | ICD-10-CM | POA: Insufficient documentation

## 2013-10-17 DIAGNOSIS — R599 Enlarged lymph nodes, unspecified: Secondary | ICD-10-CM | POA: Diagnosis not present

## 2013-10-17 DIAGNOSIS — R9389 Abnormal findings on diagnostic imaging of other specified body structures: Secondary | ICD-10-CM

## 2013-10-17 LAB — GLUCOSE, CAPILLARY: Glucose-Capillary: 90 mg/dL (ref 70–99)

## 2013-10-20 ENCOUNTER — Other Ambulatory Visit: Payer: Self-pay | Admitting: Family Medicine

## 2013-10-25 ENCOUNTER — Other Ambulatory Visit: Payer: Self-pay | Admitting: Family Medicine

## 2013-10-27 ENCOUNTER — Encounter (HOSPITAL_COMMUNITY): Payer: Self-pay

## 2013-10-27 ENCOUNTER — Encounter (HOSPITAL_COMMUNITY): Payer: Medicare Other | Attending: Hematology and Oncology

## 2013-10-27 VITALS — BP 132/69 | HR 70 | Temp 98.4°F | Resp 18 | Ht 66.0 in | Wt 144.0 lb

## 2013-10-27 DIAGNOSIS — I4891 Unspecified atrial fibrillation: Secondary | ICD-10-CM | POA: Insufficient documentation

## 2013-10-27 DIAGNOSIS — I2789 Other specified pulmonary heart diseases: Secondary | ICD-10-CM | POA: Diagnosis not present

## 2013-10-27 DIAGNOSIS — J438 Other emphysema: Secondary | ICD-10-CM

## 2013-10-27 DIAGNOSIS — Z888 Allergy status to other drugs, medicaments and biological substances status: Secondary | ICD-10-CM | POA: Insufficient documentation

## 2013-10-27 DIAGNOSIS — R040 Epistaxis: Secondary | ICD-10-CM | POA: Diagnosis not present

## 2013-10-27 DIAGNOSIS — I129 Hypertensive chronic kidney disease with stage 1 through stage 4 chronic kidney disease, or unspecified chronic kidney disease: Secondary | ICD-10-CM | POA: Insufficient documentation

## 2013-10-27 DIAGNOSIS — J449 Chronic obstructive pulmonary disease, unspecified: Secondary | ICD-10-CM | POA: Insufficient documentation

## 2013-10-27 DIAGNOSIS — H409 Unspecified glaucoma: Secondary | ICD-10-CM | POA: Insufficient documentation

## 2013-10-27 DIAGNOSIS — I509 Heart failure, unspecified: Secondary | ICD-10-CM | POA: Insufficient documentation

## 2013-10-27 DIAGNOSIS — F329 Major depressive disorder, single episode, unspecified: Secondary | ICD-10-CM

## 2013-10-27 DIAGNOSIS — F3289 Other specified depressive episodes: Secondary | ICD-10-CM

## 2013-10-27 DIAGNOSIS — N182 Chronic kidney disease, stage 2 (mild): Secondary | ICD-10-CM | POA: Diagnosis not present

## 2013-10-27 DIAGNOSIS — D649 Anemia, unspecified: Secondary | ICD-10-CM | POA: Insufficient documentation

## 2013-10-27 DIAGNOSIS — Z9889 Other specified postprocedural states: Secondary | ICD-10-CM | POA: Insufficient documentation

## 2013-10-27 DIAGNOSIS — Z79899 Other long term (current) drug therapy: Secondary | ICD-10-CM | POA: Diagnosis not present

## 2013-10-27 DIAGNOSIS — J4489 Other specified chronic obstructive pulmonary disease: Secondary | ICD-10-CM | POA: Insufficient documentation

## 2013-10-27 DIAGNOSIS — I5032 Chronic diastolic (congestive) heart failure: Secondary | ICD-10-CM | POA: Insufficient documentation

## 2013-10-27 DIAGNOSIS — Z7901 Long term (current) use of anticoagulants: Secondary | ICD-10-CM | POA: Diagnosis not present

## 2013-10-27 DIAGNOSIS — R9389 Abnormal findings on diagnostic imaging of other specified body structures: Secondary | ICD-10-CM

## 2013-10-27 DIAGNOSIS — I739 Peripheral vascular disease, unspecified: Secondary | ICD-10-CM

## 2013-10-27 DIAGNOSIS — R918 Other nonspecific abnormal finding of lung field: Secondary | ICD-10-CM | POA: Diagnosis present

## 2013-10-27 DIAGNOSIS — J309 Allergic rhinitis, unspecified: Secondary | ICD-10-CM | POA: Diagnosis not present

## 2013-10-27 DIAGNOSIS — F32A Depression, unspecified: Secondary | ICD-10-CM

## 2013-10-27 DIAGNOSIS — M109 Gout, unspecified: Secondary | ICD-10-CM | POA: Diagnosis not present

## 2013-10-27 DIAGNOSIS — F17200 Nicotine dependence, unspecified, uncomplicated: Secondary | ICD-10-CM | POA: Diagnosis not present

## 2013-10-27 DIAGNOSIS — B029 Zoster without complications: Secondary | ICD-10-CM | POA: Diagnosis not present

## 2013-10-27 DIAGNOSIS — R599 Enlarged lymph nodes, unspecified: Secondary | ICD-10-CM | POA: Diagnosis present

## 2013-10-27 LAB — COMPREHENSIVE METABOLIC PANEL
ALK PHOS: 119 U/L — AB (ref 39–117)
ALT: 7 U/L (ref 0–53)
AST: 21 U/L (ref 0–37)
Albumin: 3.5 g/dL (ref 3.5–5.2)
Anion gap: 12 (ref 5–15)
BILIRUBIN TOTAL: 0.5 mg/dL (ref 0.3–1.2)
BUN: 10 mg/dL (ref 6–23)
CALCIUM: 9.4 mg/dL (ref 8.4–10.5)
CHLORIDE: 97 meq/L (ref 96–112)
CO2: 33 mEq/L — ABNORMAL HIGH (ref 19–32)
Creatinine, Ser: 0.94 mg/dL (ref 0.50–1.35)
GFR, EST NON AFRICAN AMERICAN: 81 mL/min — AB (ref >90)
GLUCOSE: 96 mg/dL (ref 70–99)
Potassium: 3.8 mEq/L (ref 3.7–5.3)
SODIUM: 142 meq/L (ref 137–147)
Total Protein: 7.9 g/dL (ref 6.0–8.3)

## 2013-10-27 LAB — CBC WITH DIFFERENTIAL/PLATELET
Basophils Absolute: 0 10*3/uL (ref 0.0–0.1)
Basophils Relative: 0 % (ref 0–1)
EOS PCT: 2 % (ref 0–5)
Eosinophils Absolute: 0.1 10*3/uL (ref 0.0–0.7)
HEMATOCRIT: 31.1 % — AB (ref 39.0–52.0)
Hemoglobin: 9.3 g/dL — ABNORMAL LOW (ref 13.0–17.0)
LYMPHS PCT: 16 % (ref 12–46)
Lymphs Abs: 0.9 10*3/uL (ref 0.7–4.0)
MCH: 26.7 pg (ref 26.0–34.0)
MCHC: 29.9 g/dL — ABNORMAL LOW (ref 30.0–36.0)
MCV: 89.4 fL (ref 78.0–100.0)
Monocytes Absolute: 0.8 10*3/uL (ref 0.1–1.0)
Monocytes Relative: 13 % — ABNORMAL HIGH (ref 3–12)
NEUTROS ABS: 4 10*3/uL (ref 1.7–7.7)
Neutrophils Relative %: 69 % (ref 43–77)
Platelets: 233 10*3/uL (ref 150–400)
RBC: 3.48 MIL/uL — ABNORMAL LOW (ref 4.22–5.81)
RDW: 17.4 % — ABNORMAL HIGH (ref 11.5–15.5)
WBC: 5.8 10*3/uL (ref 4.0–10.5)

## 2013-10-27 LAB — LACTATE DEHYDROGENASE: LDH: 186 U/L (ref 94–250)

## 2013-10-27 NOTE — Progress Notes (Signed)
[place] A. Barnet Glasgow, M.D.  NEW PATIENT EVALUATION   Name: Theodore [name] Date: 10/27/2013 MRN: [id number] DOB: [birth year]  PCP: Tula Nakayama, MD   REFERRING PHYSICIAN: Fayrene Helper, MD  REASON FOR REFERRAL: Abnormal CT scan of the chest.      HISTORY OF PRESENT ILLNESS:Theodore [name] is a 73 y.o. male who is referred by his family physician for evaluation of abnormal CT scan showing pulmonary masses and paratracheal adenopathy. He had complained of cough and shortness of breath and underwent CT scanning in July 2015 with findings of pulmonary nodules and right pretracheal adenopathy. Appetite has been good with no weight loss. He denies any chest pain, headache, joint pain, skin rash, visual difficulties, lower extremity swelling or redness, nausea, vomiting, diarrhea, constipation, incontinence, hematuria, or forgetfulness. His wife for 35 years died about 6 months ago. He is here today with a caretaker from home health. He is a lifetime smoker currently smokes only one or 2 cigarettes daily. He has smoked 2 packs of cigarettes daily.   PAST MEDICAL HISTORY:  has a past medical history of Anemia; Tobacco abuse; Glaucoma; Allergic rhinitis; Atrial fibrillation; Diastolic heart failure; Pulmonary hypertension; Shingles; Essential hypertension, benign; COPD (chronic obstructive pulmonary disease); and Asthma.     PAST SURGICAL HISTORY: Past Surgical History  Procedure Laterality Date  . Bilateral cataract surgery    . Herniorrhapy    . Tendon repair      Right hand surgical procedure for a tendon repair  . Colonoscopy N/A 11/29/2012    Procedure: COLONOSCOPY;  Surgeon: Danie Binder, MD;  Location: [facility];  Service: Endoscopy;  Laterality: N/A;  1:00  . Esophagogastroduodenoscopy N/A 11/29/2012    Procedure: ESOPHAGOGASTRODUODENOSCOPY (EGD);  Surgeon: Danie Binder, MD;  Location: [facility];  Service:  Endoscopy;  Laterality: N/A;  . Eye surgery    . Hernia repair    . Radiology with anesthesia N/A 07/10/2013    Procedure: EMBOLIZATION-RADIOLOGY WITH ANESTHESIA;  Surgeon: Rob Hickman, MD;  Location: [facility];  Service: Radiology;  Laterality: N/A;     CURRENT MEDICATIONS: has a current medication list which includes the following prescription(s): albuterol, albuterol, allopurinol, alprazolam, brimonidine-timolol, budesonide-formoterol, diltiazem, furosemide, metoprolol, montelukast, potassium chloride, spiriva handihaler, temazepam, torsemide, tramadol, warfarin, and ferrous sulfate.   ALLERGIES: Aspirin   SOCIAL HISTORY:  reports that he has been smoking Cigarettes.  He has a 11 pack-year smoking history. He has never used smokeless tobacco. He reports that he does not drink alcohol or use illicit drugs.   FAMILY HISTORY: family history includes Cancer in his father and mother; Colon cancer in his brother; Coronary artery disease in his brother.    REVIEW OF SYSTEMS:  Other than that discussed above is noncontributory.    PHYSICAL EXAM:  height is '5\' 6"'  (1.676 m) and weight is 144 lb (65.318 kg). His temperature is 98.4 F (36.9 C). His blood pressure is 132/69 and his pulse is 70. His respiration is 18 and oxygen saturation is 97%.    GENERAL:alert, no distress and comfortable SKIN: skin color, texture, turgor are normal, no rashes or significant lesions EYES: normal, Conjunctiva are pink and non-injected, sclera clear OROPHARYNX:no exudate, no erythema and lips, buccal mucosa, and tongue normal  NECK: supple, thyroid normal size, non-tender, without nodularity CHEST: Increased AP diameter with no breast masses. LYMPH:  no palpable lymphadenopathy in the cervical, axillary or inguinal LUNGS:  clear to auscultation and percussion with normal breathing effort HEART: regular rate & rhythm and no murmurs ABDOMEN:abdomen soft, non-tender and normal bowel sounds. Liver and  spleen not enlarged. No ascites. MUSCULOSKELETALl:no cyanosis of digits, no clubbing or edema  NEURO: alert & oriented x 3 with fluent speech, no focal motor/sensory deficits    LABORATORY DATA:  Office Visit on 10/27/2013  Component Date Value Ref Range Status  . WBC 10/27/2013 5.8  4.0 - 10.5 K/uL Final  . RBC 10/27/2013 3.48* 4.22 - 5.81 MIL/uL Final  . Hemoglobin 10/27/2013 9.3* 13.0 - 17.0 g/dL Final  . HCT 10/27/2013 31.1* 39.0 - 52.0 % Final  . MCV 10/27/2013 89.4  78.0 - 100.0 fL Final  . MCH 10/27/2013 26.7  26.0 - 34.0 pg Final  . MCHC 10/27/2013 29.9* 30.0 - 36.0 g/dL Final  . RDW 10/27/2013 17.4* 11.5 - 15.5 % Final  . Platelets 10/27/2013 233  150 - 400 K/uL Final  . Neutrophils Relative % 10/27/2013 69  43 - 77 % Final  . Neutro Abs 10/27/2013 4.0  1.7 - 7.7 K/uL Final  . Lymphocytes Relative 10/27/2013 16  12 - 46 % Final  . Lymphs Abs 10/27/2013 0.9  0.7 - 4.0 K/uL Final  . Monocytes Relative 10/27/2013 13* 3 - 12 % Final  . Monocytes Absolute 10/27/2013 0.8  0.1 - 1.0 K/uL Final  . Eosinophils Relative 10/27/2013 2  0 - 5 % Final  . Eosinophils Absolute 10/27/2013 0.1  0.0 - 0.7 K/uL Final  . Basophils Relative 10/27/2013 0  0 - 1 % Final  . Basophils Absolute 10/27/2013 0.0  0.0 - 0.1 K/uL Final  Hospital Outpatient Visit on 10/17/2013  Component Date Value Ref Range Status  . Glucose-Capillary 10/17/2013 90  70 - 99 mg/dL Final  Anti-coag visit on 10/08/2013  Component Date Value Ref Range Status  . INR 10/07/2013 3.57   Final  Hospital Outpatient Visit on 10/03/2013  Component Date Value Ref Range Status  . Creatinine, Ser 10/03/2013 1.10  0.50 - 1.35 mg/dL Final  Office Visit on 09/29/2013  Component Date Value Ref Range Status  . WBC 09/29/2013 5.6  4.0 - 10.5 K/uL Final  . RBC 09/29/2013 4.16* 4.22 - 5.81 MIL/uL Final  . Hemoglobin 09/29/2013 10.9* 13.0 - 17.0 g/dL Final  . HCT 09/29/2013 35.7* 39.0 - 52.0 % Final  . MCV 09/29/2013 85.8  78.0 - 100.0  fL Final  . MCH 09/29/2013 26.2  26.0 - 34.0 pg Final  . MCHC 09/29/2013 30.5  30.0 - 36.0 g/dL Final  . RDW 09/29/2013 16.9* 11.5 - 15.5 % Final  . Platelets 09/29/2013 317  150 - 400 K/uL Final  . Neutrophils Relative % 09/29/2013 49  43 - 77 % Final  . Neutro Abs 09/29/2013 2.7  1.7 - 7.7 K/uL Final  . Lymphocytes Relative 09/29/2013 26  12 - 46 % Final  . Lymphs Abs 09/29/2013 1.5  0.7 - 4.0 K/uL Final  . Monocytes Relative 09/29/2013 21* 3 - 12 % Final  . Monocytes Absolute 09/29/2013 1.2* 0.1 - 1.0 K/uL Final  . Eosinophils Relative 09/29/2013 3  0 - 5 % Final  . Eosinophils Absolute 09/29/2013 0.2  0.0 - 0.7 K/uL Final  . Basophils Relative 09/29/2013 1  0 - 1 % Final  . Basophils Absolute 09/29/2013 0.1  0.0 - 0.1 K/uL Final  . Smear Review 09/29/2013 Criteria for review not met   Final  . Iron 09/29/2013 38* 42 -  165 ug/dL Final  . Ferritin 09/29/2013 129  22 - 322 ng/mL Final  . Sodium 09/29/2013 140  135 - 145 mEq/L Final  . Potassium 09/29/2013 3.9  3.5 - 5.3 mEq/L Final  . Chloride 09/29/2013 94* 96 - 112 mEq/L Final  . CO2 09/29/2013 38* 19 - 32 mEq/L Final  . Glucose, Bld 09/29/2013 58* 70 - 99 mg/dL Final  . BUN 09/29/2013 10  6 - 23 mg/dL Final  . Creat 09/29/2013 1.07  0.50 - 1.35 mg/dL Final  . Total Bilirubin 09/29/2013 0.6  0.2 - 1.2 mg/dL Final  . Alkaline Phosphatase 09/29/2013 117  39 - 117 U/L Final  . AST 09/29/2013 17  0 - 37 U/L Final  . ALT 09/29/2013 <8  0 - 53 U/L Final  . Total Protein 09/29/2013 7.7  6.0 - 8.3 g/dL Final  . Albumin 09/29/2013 3.6  3.5 - 5.2 g/dL Final  . Calcium 09/29/2013 9.2  8.4 - 10.5 mg/dL Final  . GFR, Est African American 09/29/2013 79   Final  . GFR, Est Non African American 09/29/2013 68   Final   Comment:                            The estimated GFR is a calculation valid for adults (>=62 years old)                          that uses the CKD-EPI algorithm to adjust for age and sex. It is                            not to  be used for children, pregnant women, hospitalized patients,                             patients on dialysis, or with rapidly changing kidney function.                          According to the NKDEP, eGFR >89 is normal, 60-89 shows mild                          impairment, 30-59 shows moderate impairment, 15-29 shows severe                          impairment and <15 is ESRD.                             Marland Kitchen Uric Acid, Serum 09/29/2013 4.4  4.0 - 7.8 mg/dL Final    Urinalysis    Component Value Date/Time   COLORURINE YELLOW 07/08/2013 1324   APPEARANCEUR CLEAR 07/08/2013 1324   LABSPEC 1.020 07/08/2013 1324   PHURINE 6.5 07/08/2013 1324   GLUCOSEU NEGATIVE 07/08/2013 1324   HGBUR NEGATIVE 07/08/2013 1324   HGBUR trace-intact 05/06/2007 0853   BILIRUBINUR NEGATIVE 07/08/2013 1324   KETONESUR NEGATIVE 07/08/2013 1324   PROTEINUR NEGATIVE 07/08/2013 1324   UROBILINOGEN 0.2 07/08/2013 1324   NITRITE NEGATIVE 07/08/2013 1324   LEUKOCYTESUR NEGATIVE 07/08/2013 1324      '@RADIOGRAPHY' :  DG Chest 2 View Status: Final result         PACS Images  Show images for DG Chest 2 View         Study Result    CLINICAL DATA: Shortness of breath. History of COPD and congestive  heart failure.  EXAM:  CHEST 2 VIEW  COMPARISON: CT chest 03/18/2011 and PA and lateral chest  06/05/2012.  FINDINGS:  There is cardiomegaly without edema. The lungs are emphysematous but  clear. No pneumothorax or pleural effusion.  IMPRESSION:  Cardiomegaly without edema.  Emphysema.  Electronically Signed  By: Inge Rise M.D.  On: 03/17/2013    IR Transcath/Emboliz Status: Final result            Study Result    CLINICAL DATA: Recurrent epistaxis.  EXAM:  BILATERAL COMMON CAROTID AND INNOMINATE ANGIOGRAPHY, AND BILATERAL  EXTERNAL CAROTID ARTERY ARTERIOGRAM FOLLOWED BY ENDOVASCULAR  SUPERSELECTIVE EMBOLIZATION OF EXTERNAL CAROTID ARTERY BRANCHES FOR  RECURRENT PERSISTENT EPISTAXIS:    ANESTHESIA/SEDATION:  General anesthesia.  MEDICATIONS:  As per general anesthesia.  CONTRAST: 116m OMNIPAQUE IOHEXOL 300 MG/ML SOLN  PROCEDURE:  Following A full explanation of the procedure along with the  potential associated complications, an informed witnessed consent  was obtained. The patient was put under general anesthesia [facility]  The right groin was prepped and draped in the usual sterile fashion.  Thereafter using a modified Seldinger technique, transfemoral access  into the right common femoral artery was obtained without  difficulty. Over a 0.035 inch guidewire, a 5 French Pinnacle sheath  was inserted. Through this, and also over a 0.035 inch guidewire, a  5 French JB1 catheter was advanced to the right common carotid  artery. The guidewire was removed. Good aspiration was obtained from  the hub of the, 5 FPakistandiagnostic catheter.  An arteriogram was performed centered over the right common carotid  artery bifurcation and intracranially.  The right external carotid artery and its major branches appear to  be widely patent. Abnormal prominence of the internal maxillary  artery, the infraorbital artery and the descending palatine artery  were seen.  No extravasation of contrast however was noted.  Right internal carotid artery at the bulb demonstrated smooth  atherosclerotic plaque along the posterior wall without associated  ulcerations or calcifications. The right internal carotid artery was  otherwise seen to opacify normally to the cranial skull base. The  petrous, the cavernous and the supraclinoid segments opacified  normally.  A right posterior communicating artery was seen to opacify the right  posterior cerebral artery distribution.  The right middle and the right anterior cerebral artery were seen to  opacify normally into the capillary and the venous phases.  SUPERSELECTIVE ENDOVASCULAR EMBOLIZATION  OF EXTERNAL CAROTID ARTERY  BRANCHES BILATERALLY USING PVA PARTICLES:  The diagnostic JB1 catheter in the right common carotid artery was  then exchanged over a 0.035 inch 300 cm Rosen exchange guidewire for  a 6 French 65 cm neurovascular Arrow sheath using biplane roadmap  technique and constant fluoroscopic guidance. Good aspiration was  obtained from the side port of the neurovascular sheath. A gentle  contrast injection demonstrated no evidence of spasms, dissections  or of intraluminal filling defects.  This was then connected to a continuous heparinized saline infusion.  Over the RHumana Incguidewire, a 6 French 90 cm BriteTip Envoy  guide catheter was then advanced and positioned just proximal to the  right common carotid bifurcation. The guidewire was removed. Good  aspiration was obtained from the hub of the 6 FPakistanguide catheter.  Gentle contrast injection demonstrated no evidence of spasms,  dissections or of intraluminal filling defects.  Using biplane roadmap technique and constant fluoroscopic guidance,  the 6 French [facility] BriteTip Envoy guide catheter was then advanced to  the proximal right external carotid artery using biplane roadmap  technique and constant fluoroscopic guidance. The guidewire was  removed. Good aspiration was obtained from the hub of the 6 [place]  guide catheter. A gentle contrast injection demonstrated no evidence  of spasms, dissections, or of intraluminal filling defects.  At this time using biplane roadmap technique and constant  fluoroscopic guidance, in a coaxial manner and with constant  heparinized saline infusion, a rapid transit 2 tip microcatheter  which had been steam-shaped was then advanced over a 0.0140 inch  Synchro micro guidewire to the distal end of the guide catheter.  With the micro guidewire leading with the J-tip configuration, the  combination was navigated without difficulty to the internal  maxillary artery and  subsequently into the vessels just distal to  the infraorbital artery.  The guidewire was removed. Good aspiration was obtained from the hub  of the microcatheter. Gentle contrast injection demonstrated safe  position of the tip of the microcatheter for embolization again. No  abnormal dangerous communications with the vertebral artery or with  the internal carotid arteries were seen.  At this time using biplane roadmap technique, combination of 250-355  micron PVA particles, 355-500 micron PVA particles and 500-700  micron PVA particles mixed in a combination of 75% contrast and 25%  heparinized saline infusion, were injected under biplane  intermittent fluoroscopy until stasis in the vessels sampled. The  first branch was the internal maxillary artery distal to the  infraorbital artery, the infraorbital artery, the descending  palatine artery, and the distal nasal septal branch of the facial  artery.  Each of these vessels was entered with the micro guidewire using  biplane intermittent fluoroscopy. Each of these blood vessels was  then embolized superselectively under biplane intermittent  fluoroscopy until there was stasis in the vessel of concern. Also  prior to embolization, a gentle control arteriogram was performed  through the micro catheter to ensure no pathological or dangerous  communications with the internal carotid artery or the posterior  circulation.  At the end of the embolization of all these vessels, a control  arteriogram performed via the 6 [place] guide catheter in the right  common carotid artery, demonstrated complete angiographic  obliteration. Also no abnormal filling defects were seen within the  intracranial circulation.  The a 6 [place] guide catheter was then positioned in the left common  carotid artery as described above. An arteriogram was then performed  seen intracranially which revealed the left internal carotid  bifurcation and the left internal  carotid artery to the cranial  skull base to opacify.  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 This  measured approximately 8 mm x 5 mm.  The petrous, the cavernous and the supraclinoid segments were also  normal.  Normal opacification was seen of the left middle cerebral artery and  left anterior cerebral artery into the capillary and the venous  phases. Also noted was saccular shaped prominence in the left  internal carotid artery terminus.  The left external carotid artery origin was normal.  Opacification of the branches of the external carotid artery  appeared to be patent.  Again noted was abnormal prominence of the descending palatine  branch of the internal maxillary artery distally.  The using biplane intermittent fluoroscopy, over a 0.035 inch  Roadrunner guidewire, the 6 [place] guide catheter was positioned in  the proximal left external carotid artery. The guidewire was  removed. Good aspiration was obtained from the hub of the 6 [place]  guide catheter. Using a biplane roadmap technique and constant  fluoroscopic guidance, in a coaxial manner, and with constant  heparinized saline infusion, rapid transit 2 tip microcatheter which  had been steam-shaped was then advanced over a 0.014 inch Synchro  soft micro guidewire and advanced with the micro guidewire leading  with a J-tip configuration to the internal maxillary artery distal  to the origin of the infraorbital artery.  The guidewire was removed. Good aspiration was obtained from the hub  of the microcatheter. A gentle contrast injection demonstrated no  evidence of spasm, dissections, or of intraluminal filling defects.  No abnormal communications were seen with the vertebral of the  intracranial circulation.  Again, PVA particles of sizes 250 microns to 355 microns, 355  microns to 500 microns, and 700 microns were then injected in  this  branch, the infraorbital artery and also the descending palatine  artery. Similar embolization was then performed superselectively of  the left facial artery nasoseptal branch until there was stasis with  reflux.  The microcatheter was then retrieved. A control arteriogram  performed through the left external carotid artery demonstrated  stasis within the embolized blood vessels as described.  With the guide catheter in the left common carotid artery, no change  was seen in the intracranial circulation as described previously.  Importantly no evidence of dissections, or intraluminal filling  defects was seen. The 6 [place] guide catheter and the 6 French  neurovascular sheath was then retrieved into the abdominal aorta and  exchange over a J-tip guidewire for a 6 [place] Pinnacle sheath. This  was then successfully retrieved with the application of an external  closure device. The patient received 3000 units of IV heparin  achieving an ACT of around 170-180 seconds.  The patient's general anesthesia was then reversed and the patient  was slowly extubated in the neuro ICU. Upon recovery, the patient  demonstrated no new neurological signs or symptoms. During the  procedure there was no evidence of bleeding from either nostril.  COMPLICATIONS:  None immediate.  FINDINGS:  As above.  IMPRESSION:  Status post endovascular superselective embolization of external  carotid artery branches bilaterally as described. PVA particles of  sizes 250-355 microns, 355 microns to 500 microns, and 500-700  microns as described above, for recurrent epistaxis.  Electronically Signed  By: Luanne Bras M.D.  On: 07/17/2013 12:      Ct Angio Head W/cm &/or Wo Cm  10/06/2013   CLINICAL DATA:  Previous embolization for recurrent epistaxis  EXAM: CT ANGIOGRAPHY HEAD AND NECK  TECHNIQUE: Multidetector CT imaging of the head and neck was performed using the standard protocol during bolus  administration of intravenous contrast. Multiplanar CT image reconstructions and MIPs were obtained to evaluate the vascular anatomy. Carotid stenosis measurements (when applicable) are obtained utilizing NASCET criteria, using the distal internal carotid diameter as the denominator.  CONTRAST:  130m OMNIPAQUE IOHEXOL 350 MG/ML SOLN  COMPARISON:  Catheter exam 07/10/2013.  CT chest 03/18/2011  FINDINGS: CTA HEAD FINDINGS  The brain shows mild generalized atrophy without evidence of old or acute infarction, mass lesion, hemorrhage, hydrocephalus or extra-axial collection.  Both internal carotid arteries are patent through the skullbase.  There is atherosclerotic calcification in the carotid siphon regions with narrowing estimated at 30% bilaterally. The anterior and middle cerebral vessels are patent without proximal stenosis, aneurysm or vascular malformation.  Both vertebral arteries are small but patent through the foramen magnum to the basilar. The basilar artery is quite small but does not show a focal stenosis. Both posterior cerebral arteries receive there supply from the anterior circulation.  No intracranial venous pathology.  Review of the MIP images confirms the above findings.  CTA NECK FINDINGS  Branching pattern of the brachiocephalic vessels from the arch is normal. No origin stenoses. The right common carotid artery is widely patent through the neck to the bifurcation region. There is atherosclerotic change at the carotid bifurcation. Minimal diameter of the proximal ICA is 4 mm. Compared to a more distal cervical ICA diameter of 5 mm, this indicates a 20% stenosis. The external carotid artery is widely patent proximally. No abnormal branch vessels are discernible in the region of the sinuses and nasal passages.  The left common carotid artery show some atherosclerotic change but is widely patent to the bifurcation. There is atherosclerotic disease of the carotid bifurcation but no stenosis of the  ICA. The external carotid artery is widely patent. External carotid branch vessels appear normal without any abnormal vessels being noted in the region of the nasal passages or sinuses.  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  The left vertebral artery is a small vessel. There is stenosis at the origin but the vessel is patent through the cervical region beyond mass.  No soft tissue neck lesion identified. Lung apices show pronounced emphysema. On the initial image, there is a spiculated mass in the left upper lobe measuring 11 mm in diameter. This is quite worrisome for lung cancer. In the right upper lobe on that same image, there is a 7 8 mm spiculated nodule that could represent a synchronous primary.  Review of the MIP images confirms the above findings.  IMPRESSION: Importantly, there are bilateral lung masses worrisome for lung cancer in this patient with advanced emphysema. Complete chest CT evaluation is recommended, but the likelihood of malignancy is quite high.  Atherosclerotic disease at both carotid bifurcations without flow-limiting stenosis.  No abnormal vessels identified in the region of the nasal passages or sinuses.  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  These results will be called to the ordering clinician or representative by the Radiologist Assistant, and communication documented in the PACS or zVision Dashboard.   Electronically Signed   By: Nelson Chimes M.D.   On: 10/06/2013 16:05    Nm Pet Image Initial (pi) Skull Base To Thigh  10/17/2013   CLINICAL DATA:  Initial treatment strategy for Lung cancer.  EXAM: NUCLEAR MEDICINE PET SKULL BASE TO THIGH  TECHNIQUE: 8.0 mCi F-18 FDG was injected intravenously. Full-ring PET imaging was performed from the skull base to thigh after the radiotracer. CT data was obtained and used for attenuation  correction and anatomic localization.  FASTING BLOOD GLUCOSE:  Value: 90 mg/dl  COMPARISON:  None.  FINDINGS: NECK  No hypermetabolic lymph nodes in the neck.  CHEST  Moderate to advanced changes of centrilobular and paraseptal emphysema identified. 9 mm nodule within the left upper lobe has an SUV max equal to 4.2. Within the right upper lobe there is a 7 mm nodule within SUV max equal  to 1.9. Within the superior segment of the right lower lobe there is a nodule measuring 7 mm. This has an SUV max equal to 1.9. There is a tiny nodule in the right upper lobe measuring 4 mm. This is far too small to characterize by PET-CT. Enlarged right paratracheal lymph node measures 1.8 cm. The SUV max associated with this lymph node is equal to 2.8. Left paratracheal lymph node has an SUV max equal to 3.0. The heart size is enlarged. No pericardial effusion. There is calcified atherosclerotic disease involving the thoracic aorta as well as the LAD, left circumflex and RCA coronary arteries.  ABDOMEN/PELVIS  No abnormal hypermetabolic activity within the liver, pancreas, adrenal glands, or spleen. No hypermetabolic lymph nodes in the abdomen or pelvis. Calcified atherosclerotic disease involves the abdominal aorta.  SKELETON  No focal hypermetabolic activity to suggest skeletal metastasis.  IMPRESSION: 1. There are at least 3 pulmonary nodules within the right upper lobe, right lower lobe and left upper lobe are identified and exhibit malignant range FDG uptake and are worrisome for malignancy. 2. Enlarged right paratracheal lymph node exhibits malignant range activity and is worrisome for metastatic adenopathy. 3. No evidence for metastatic disease to the bones, abdomen or pelvis. 4. Extensive atherosclerotic disease including multi vessel coronary artery calcification. 5. Emphysema.   Electronically Signed   By: Kerby Moors M.D.   On: 10/17/2013 14:28    PATHOLOGY: To be obtained.   IMPRESSION:  #1. 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 #2. Chronic obstructive pulmonary disease #3. Gout, on treatment. #4. Atrial fibrillation with controlled ventricular response, on warfarin. #5. Chronic kidney disease, stage II. #6. Epistaxis, status post endovascular supersensitive embolectomy of external carotid arteries. #7. Normocytic normochromic anemia.   PLAN:  #1. Refer to Dr. Roxan Hockey for nediastinoscopy, or fats, or anterior mediastinum he for tissue diagnosis. #2. ACE level today along with CBC, chem profile, and LDH. #3. Followup in 3 weeks for definitive recommendations.  I appreciate the opportunity of sharing in his care.   Doroteo Bradford, MD 10/27/2013 3:34 PM   DISCLAIMER:  This note was dictated with voice recognition softwre.  Similar sounding words can inadvertently be transcribed inaccurately and may not be corrected upon review.

## 2013-10-27 NOTE — Patient Instructions (Signed)
..  [facility] Discharge Instructions  RECOMMENDATIONS MADE BY THE CONSULTANT AND ANY TEST RESULTS WILL BE SENT TO YOUR REFERRING PHYSICIAN.  EXAM FINDINGS BY THE PHYSICIAN TODAY AND SIGNS OR SYMPTOMS TO REPORT TO CLINIC OR PRIMARY PHYSICIAN: Exam and findings as discussed by Dr. Barnet Glasgow.  We need to get a tissue diagnosis to know exactly what these lesions are and  how to treat this INSTRUCTIONS/FOLLOW-UP: Referral to Dr. Roxan Hockey for bronchoscopy and mediastinoscopy 3 weeks to see [place] back  Thank you for choosing [facility] to provide your oncology and hematology care.  To afford each patient quality time with our providers, please arrive at least 15 minutes before your scheduled appointment time.  With your help, our goal is to use those 15 minutes to complete the necessary work-up to ensure our physicians have the information they need to help with your evaluation and healthcare recommendations.    Effective January 1st, 2014, we ask that you re-schedule your appointment with our physicians should you arrive 10 or more minutes late for your appointment.  We strive to give you quality time with our providers, and arriving late affects you and other patients whose appointments are after yours.    Again, thank you for choosing [facility].  Our hope is that these requests will decrease the amount of time that you wait before being seen by our physicians.       _____________________________________________________________  Should you have questions after your visit to [facility], please contact our office at [phone] between the hours of 8:30 a.m. and 4:30 p.m.  Voicemails left after 4:30 p.m. will not be returned until the following business day.  For prescription refill requests, have your pharmacy contact our office with your prescription refill request.     _______________________________________________________________  We hope that we have given you very good care.  You may receive a patient satisfaction survey in the mail, please complete it and return it as soon as possible.  We value your feedback!  _______________________________________________________________  Have you asked about our STAR program?  STAR stands for Survivorship Training and Rehabilitation, and this is a nationally recognized cancer care program that focuses on survivorship and rehabilitation.  Cancer and cancer treatments may cause problems, such as, pain, making you feel tired and keeping you from doing the things that you need or want to do. Cancer rehabilitation can help. Our goal is to reduce these troubling effects and help you have the best quality of life possible.  You may receive a survey from a nurse that asks questions about your current state of health.  Based on the survey results, all eligible patients will be referred to the [facility] program for an evaluation so we can better serve you!  A frequently asked questions sheet is available upon request.

## 2013-10-28 LAB — ANGIOTENSIN CONVERTING ENZYME: Angiotensin-Converting Enzyme: 25 U/L (ref 8–52)

## 2013-10-28 LAB — FERRITIN: FERRITIN: 133 ng/mL (ref 22–322)

## 2013-11-10 ENCOUNTER — Ambulatory Visit (INDEPENDENT_AMBULATORY_CARE_PROVIDER_SITE_OTHER): Payer: Medicare Other | Admitting: Cardiovascular Disease

## 2013-11-10 ENCOUNTER — Encounter: Payer: Self-pay | Admitting: Cardiovascular Disease

## 2013-11-10 ENCOUNTER — Ambulatory Visit (INDEPENDENT_AMBULATORY_CARE_PROVIDER_SITE_OTHER): Payer: Medicare Other | Admitting: *Deleted

## 2013-11-10 VITALS — BP 112/70 | HR 70 | Ht 66.0 in | Wt 145.0 lb

## 2013-11-10 DIAGNOSIS — Z720 Tobacco use: Secondary | ICD-10-CM

## 2013-11-10 DIAGNOSIS — Z5181 Encounter for therapeutic drug level monitoring: Secondary | ICD-10-CM

## 2013-11-10 DIAGNOSIS — C3410 Malignant neoplasm of upper lobe, unspecified bronchus or lung: Secondary | ICD-10-CM

## 2013-11-10 DIAGNOSIS — I4729 Other ventricular tachycardia: Secondary | ICD-10-CM

## 2013-11-10 DIAGNOSIS — I4891 Unspecified atrial fibrillation: Secondary | ICD-10-CM

## 2013-11-10 DIAGNOSIS — I10 Essential (primary) hypertension: Secondary | ICD-10-CM

## 2013-11-10 DIAGNOSIS — F17200 Nicotine dependence, unspecified, uncomplicated: Secondary | ICD-10-CM

## 2013-11-10 DIAGNOSIS — I472 Ventricular tachycardia: Secondary | ICD-10-CM

## 2013-11-10 DIAGNOSIS — J449 Chronic obstructive pulmonary disease, unspecified: Secondary | ICD-10-CM

## 2013-11-10 DIAGNOSIS — R079 Chest pain, unspecified: Secondary | ICD-10-CM

## 2013-11-10 DIAGNOSIS — I5032 Chronic diastolic (congestive) heart failure: Secondary | ICD-10-CM

## 2013-11-10 LAB — POCT INR: INR: 3.4

## 2013-11-10 NOTE — Patient Instructions (Signed)
Your physician wants you to follow-up in: 6 months You will receive a reminder letter in the mail two months in advance. If you don't receive a letter, please call our office to schedule the follow-up appointment.    Your physician has recommended you make the following change in your medication:     STOP Lasix      Thank you for choosing [facility] !

## 2013-11-10 NOTE — Progress Notes (Signed)
Patient ID: Theodore [name], male   DOB: 05-Nov-1940, 73 y.o.   MRN: [id number]      SUBJECTIVE: The patient returns for followup of chronic diastolic heart failure and right heart failure, nonsustained ventricular tachycardia and atrial fibrillation. He also has severe COPD with severe pulmonary hypertension.  Nuclear myocardial perfusion study in May 2015 demonstrated inferior wall diaphragmatic soft tissue attenuation versus scar with no large ischemic zones noted. He has a long history of tobacco abuse. 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  He has been feeling well and denies chest pain and any worsening of his baseline shortness of breath. He denies orthopnea, PND, palpitations and leg swelling. He has been taking both torsemide and Lasix.  He has home health and is enrolled with [facility]. He has been monitoring his blood pressure and daily weights and they have all been very stable.  Review of Systems: As per "subjective", otherwise negative.  Allergies  Allergen Reactions  . Aspirin Other (See Comments)    On coumadin    Current Outpatient Prescriptions  Medication Sig Dispense Refill  . albuterol (PROAIR HFA) 108 (90 BASE) MCG/ACT inhaler Inhale 2 puffs into the lungs every 6 (six) hours as needed for wheezing or shortness of breath.  6.7 g  4  . albuterol (PROVENTIL) (2.5 MG/3ML) 0.083% nebulizer solution INHALE CONTENTS OF 1 VIAL VIA NEBULIZER EVERY 6 HOURS AS NEEDED  375 mL  1  . allopurinol (ZYLOPRIM) 300 MG tablet Take 1 tablet (300 mg total) by mouth daily.  30 tablet  6  . ALPRAZolam (XANAX) 0.25 MG tablet One tablet once daily as needed, for anxiety  30 tablet  4  . brimonidine-timolol (COMBIGAN) 0.2-0.5 % ophthalmic solution Place 1 drop into both eyes every 12 (twelve) hours.  5 mL  0  . budesonide-formoterol (SYMBICORT) 160-4.5 MCG/ACT inhaler  Inhale 2 puffs into the lungs 2 (two) times daily.  1 Inhaler  4  . diltiazem (CARDIZEM CD) 180 MG 24 hr capsule Take 1 capsule (180 mg total) by mouth daily.  30 capsule  6  . ferrous sulfate 325 (65 FE) MG tablet Take 325 mg by mouth 2 (two) times daily.      . furosemide (LASIX) 40 MG tablet Take 40 mg by mouth daily.      . metoprolol (LOPRESSOR) 50 MG tablet Take 1 tablet (50 mg total) by mouth 2 (two) times daily.  60 tablet  0  . montelukast (SINGULAIR) 10 MG tablet Take 10 mg by mouth at bedtime.      . potassium chloride (K-DUR) 10 MEQ tablet Take 10 mEq by mouth 2 (two) times daily.      Marland Kitchen SPIRIVA HANDIHALER 18 MCG inhalation capsule INHALE THE CONTENTS OF 1 CAPSULE USING HANDIHALER EVERY DAY  30 capsule  6  . temazepam (RESTORIL) 15 MG capsule TAKE 1 CAPSULE BY MOUTH EVERY NIGHT AT BEDTIME AS NEEDED FOR SLEEP  30 capsule  2  . torsemide (DEMADEX) 20 MG tablet Take 2 tablets (40 mg total) by mouth daily.  60 tablet  3  . traMADol (ULTRAM) 50 MG tablet Take 1 tablet (50 mg total) by mouth daily as needed.  30 tablet  0  . warfarin (COUMADIN) 4 MG tablet Take 1 tablet daily except 1 1/2 tablets on Mondays -Wednesday-friday or as directed       No current facility-administered  medications for this visit.    Past Medical History  Diagnosis Date  . Anemia     Hemoglobin 11.6 in 04/2008 -> resolved   . Tobacco abuse   . Glaucoma   . Allergic rhinitis   . Atrial fibrillation     Coumadin  . Diastolic heart failure     LVEF 44-96%, rate 2 diastolic dysfunction 09/5914  . Pulmonary hypertension     70 mmHg 03/2012  . Shingles   . Essential hypertension, benign   . COPD (chronic obstructive pulmonary disease)   . Asthma     Past Surgical History  Procedure Laterality Date  . Bilateral cataract surgery    . Herniorrhapy    . Tendon repair      Right hand surgical procedure for a tendon repair  . Colonoscopy N/A 11/29/2012    Procedure: COLONOSCOPY;  Surgeon: Danie Binder, MD;   [facility];  Service: Endoscopy;  Laterality: N/A;  1:00  . Esophagogastroduodenoscopy N/A 11/29/2012    Procedure: ESOPHAGOGASTRODUODENOSCOPY (EGD);  Surgeon: Danie Binder, MD;  [facility];  Service: Endoscopy;  Laterality: N/A;  . Eye surgery    . Hernia repair    . Radiology with anesthesia N/A 07/10/2013    Procedure: EMBOLIZATION-RADIOLOGY WITH ANESTHESIA;  Surgeon: Rob Hickman, MD;  Location: [place];  Service: Radiology;  Laterality: N/A;    History   Social History  . Marital Status: Widowed    Spouse Name: N/A    Number of Children: 2  . Years of Education: N/A   Occupational History  . Retired   .     Social History Main Topics  . Smoking status: Current Some Day Smoker -- 0.20 packs/day for 55 years    Types: Cigarettes    Last Attempt to Quit: 02/13/2012  . Smokeless tobacco: Never Used     Comment: occasionally smokes  . Alcohol Use: No  . Drug Use: No  . Sexual Activity: No   Other Topics Concern  . Not on file   Social History Narrative  . No narrative on file   BP 112/70  Pulse 70     PHYSICAL EXAM General: NAD HEENT: Normal. Neck: No JVD, no thyromegaly. Lungs: Clear to auscultation bilaterally with normal respiratory effort. CV: Nondisplaced PMI.  Regular rate and rhythm, normal S1/S2, no S3/S4, no murmur. No pretibial or periankle edema.  No carotid bruit.   Abdomen: Soft, nontender, no hepatosplenomegaly, no distention.  Neurologic: Alert and oriented x 3.  Psych: Normal affect. Skin: Normal. Musculoskeletal: Normal range of motion, no gross deformities. Extremities: No clubbing or cyanosis.   ECG: Most recent ECG reviewed.      ASSESSMENT AND PLAN: 1. Chronic diastolic heart failure and right heart failure: Euvolemic today. Continue torsemide 40 mg daily. I told him to stop taking Lasix. 2. NSVT and chest pain: This occurred in a prior hospitalization, and due to his hospitalization for epistaxis.  Nuclear MPI study with no evidence of ischemia. 3. Essential HTN: Well controlled on current therapy. 4. Atrial fibrillation: Rate is controlled with diltiazem. Continue warfarin for anticoagulation.  Dispo: f/u 6 months.   Kate Sable, M.D., F.A.C.C.

## 2013-11-11 ENCOUNTER — Encounter: Payer: Self-pay | Admitting: Thoracic Surgery (Cardiothoracic Vascular Surgery)

## 2013-11-11 ENCOUNTER — Institutional Professional Consult (permissible substitution) (INDEPENDENT_AMBULATORY_CARE_PROVIDER_SITE_OTHER): Payer: Medicare Other | Admitting: Thoracic Surgery (Cardiothoracic Vascular Surgery)

## 2013-11-11 VITALS — BP 134/74 | HR 73 | Resp 20 | Ht 66.0 in | Wt 145.0 lb

## 2013-11-11 DIAGNOSIS — R599 Enlarged lymph nodes, unspecified: Secondary | ICD-10-CM

## 2013-11-11 DIAGNOSIS — R918 Other nonspecific abnormal finding of lung field: Secondary | ICD-10-CM

## 2013-11-11 DIAGNOSIS — R590 Localized enlarged lymph nodes: Secondary | ICD-10-CM

## 2013-11-11 NOTE — Progress Notes (Signed)
PCP is Tula Nakayama, MD Referring Provider is Farrel Gobble, MD  Chief Complaint  Patient presents with  . Lung Lesion    Surgical eval on multiple pulmonary nodules, PET Scan 10/17/13    HPI: Mr. Kirsh is a 73 year old gentleman sent for consultation regarding lung nodules and mediastinal adenopathy.  He is a 73 year old man with a complex medical history including chronic diastolic heart failure, chronic atrial fibrillation, heavy tobacco abuse (2 packs per day x55 years), COPD, hypertension, and pulmonary hypertension. He recently was being evaluated for recurrent epistaxis. He had 2 severe episodes requiring hospitalization and transfusion. He ultimately underwent embolization and has not had any further episodes since that procedure. As part of that procedure he had a CT angiogram of his neck and on that was noted to have a spiculated mass in his left upper lobe. He subsequently had a PET/CT. It showed at least 3 pulmonary nodules or hypermetabolic. In addition there were enlarged and hypermetabolic paratracheal lymph nodes.  He had smoked 2 packs per day until recently. He says he is currently down to 2 cigarettes a day. He does get short of breath with exertion. He is on home oxygen but mainly uses at night when he is asleep. He is on Coumadin for his chronic atrial fibrillation. He has been working with his [place] and his heart failure is currently well compensated.  ECoG/Zubrod= 2  Past Medical History  Diagnosis Date  . Anemia     Hemoglobin 11.6 in 04/2008 -> resolved   . Tobacco abuse   . Glaucoma   . Allergic rhinitis   . Atrial fibrillation     Coumadin  . Diastolic heart failure     LVEF 62-83%, rate 2 diastolic dysfunction 03/5174  . Pulmonary hypertension     70 mmHg 03/2012  . Shingles   . Essential hypertension, benign   . COPD (chronic obstructive pulmonary disease)   . Asthma     Past Surgical History  Procedure Laterality Date  .  Bilateral cataract surgery    . Herniorrhapy    . Tendon repair      Right hand surgical procedure for a tendon repair  . Colonoscopy N/A 11/29/2012    Procedure: COLONOSCOPY;  Surgeon: Danie Binder, MD;  [facility];  Service: Endoscopy;  Laterality: N/A;  1:00  . Esophagogastroduodenoscopy N/A 11/29/2012    Procedure: ESOPHAGOGASTRODUODENOSCOPY (EGD);  Surgeon: Danie Binder, MD;  [facility];  Service: Endoscopy;  Laterality: N/A;  . Eye surgery    . Hernia repair    . Radiology with anesthesia N/A 07/10/2013    Procedure: EMBOLIZATION-RADIOLOGY WITH ANESTHESIA;  Surgeon: Rob Hickman, MD;  Location: [place];  Service: Radiology;  Laterality: N/A;    Family History  Problem Relation Age of Onset  . Cancer Mother   . Cancer Father   . Coronary artery disease Brother   . Colon cancer Brother     Social History History  Substance Use Topics  . Smoking status: Current Some Day Smoker -- 2.00 packs/day for 55 years    Types: Cigarettes  . Smokeless tobacco: Never Used     Comment: says he is down to 2-3 cigarettes/ day  . Alcohol Use: No    Current Outpatient Prescriptions  Medication Sig Dispense Refill  . albuterol (PROAIR HFA) 108 (90 BASE) MCG/ACT inhaler Inhale 2 puffs into the lungs every 6 (six) hours as needed for wheezing or shortness of breath.  6.7 g  4  . albuterol (PROVENTIL) (2.5 MG/3ML) 0.083% nebulizer solution INHALE CONTENTS OF 1 VIAL VIA NEBULIZER EVERY 6 HOURS AS NEEDED  375 mL  1  . allopurinol (ZYLOPRIM) 300 MG tablet Take 1 tablet (300 mg total) by mouth daily.  30 tablet  6  . ALPRAZolam (XANAX) 0.25 MG tablet One tablet once daily as needed, for anxiety  30 tablet  4  . brimonidine-timolol (COMBIGAN) 0.2-0.5 % ophthalmic solution Place 1 drop into both eyes every 12 (twelve) hours.  5 mL  0  . budesonide-formoterol (SYMBICORT) 160-4.5 MCG/ACT inhaler Inhale 2 puffs into the lungs 2 (two) times daily.  1 Inhaler  4  .  diltiazem (CARDIZEM CD) 180 MG 24 hr capsule Take 1 capsule (180 mg total) by mouth daily.  30 capsule  6  . ferrous sulfate 325 (65 FE) MG tablet Take 325 mg by mouth 2 (two) times daily.      . metoprolol (LOPRESSOR) 50 MG tablet Take 1 tablet (50 mg total) by mouth 2 (two) times daily.  60 tablet  0  . montelukast (SINGULAIR) 10 MG tablet Take 10 mg by mouth at bedtime.      . potassium chloride (K-DUR) 10 MEQ tablet Take 10 mEq by mouth 2 (two) times daily.      Marland Kitchen SPIRIVA HANDIHALER 18 MCG inhalation capsule INHALE THE CONTENTS OF 1 CAPSULE USING HANDIHALER EVERY DAY  30 capsule  6  . temazepam (RESTORIL) 15 MG capsule TAKE 1 CAPSULE BY MOUTH EVERY NIGHT AT BEDTIME AS NEEDED FOR SLEEP  30 capsule  2  . torsemide (DEMADEX) 20 MG tablet Take 2 tablets (40 mg total) by mouth daily.  60 tablet  3  . traMADol (ULTRAM) 50 MG tablet Take 1 tablet (50 mg total) by mouth daily as needed.  30 tablet  0  . warfarin (COUMADIN) 4 MG tablet Take 1 tablet daily except 1 1/2 tablets on Mondays -Wednesday-friday or as directed       No current facility-administered medications for this visit.    Allergies  Allergen Reactions  . Aspirin Other (See Comments)    On coumadin    Review of Systems  Constitutional: Positive for fatigue. Negative for fever.  Respiratory: Positive for cough and shortness of breath.        Home oxygen at night  Hematological: Bruises/bleeds easily (on coumadin, embolization for recurrent epistaxis).  Psychiatric/Behavioral: The patient is nervous/anxious.   All other systems reviewed and are negative.   BP 134/74  Pulse 73  Resp 20  Ht 5\' 6"  (1.676 m)  Wt 145 lb (65.772 kg)  BMI 23.41 kg/m2  SpO2 % Physical Exam  Vitals reviewed. Constitutional: He is oriented to person, place, and time. No distress.  HENT:  Head: Normocephalic and atraumatic.  Eyes: Pupils are equal, round, and reactive to light.  Neck: Neck supple. No thyromegaly present.  Cardiovascular:  Normal rate and regular rhythm.  Exam reveals gallop (+S4).   No murmur heard. Pulmonary/Chest: Effort normal and breath sounds normal.  Abdominal: Soft. There is no tenderness.  Musculoskeletal: He exhibits edema.  Lymphadenopathy:    He has no cervical adenopathy.  Neurological: He is alert and oriented to person, place, and time. No cranial nerve deficit.  Skin: Skin is warm and dry.     Diagnostic Tests: NUCLEAR MEDICINE PET SKULL BASE TO THIGH  TECHNIQUE:  8.0 mCi F-18 FDG was injected intravenously. Full-ring PET imaging  was performed from the skull  base to thigh after the radiotracer. CT  data was obtained and used for attenuation correction and anatomic  localization.  FASTING BLOOD GLUCOSE: Value: 90 mg/dl  COMPARISON: None.  FINDINGS:  NECK  No hypermetabolic lymph nodes in the neck.  CHEST  Moderate to advanced changes of centrilobular and paraseptal  emphysema identified. 9 mm nodule within the left upper lobe has an  SUV max equal to 4.2. Within the right upper lobe there is a 7 mm  nodule within SUV max equal to 1.9. Within the superior segment of  the right lower lobe there is a nodule measuring 7 mm. This has an  SUV max equal to 1.9. There is a tiny nodule in the right upper lobe  measuring 4 mm. This is far too small to characterize by PET-CT.  Enlarged right paratracheal lymph node measures 1.8 cm. The SUV max  associated with this lymph node is equal to 2.8. Left paratracheal  lymph node has an SUV max equal to 3.0. The heart size is enlarged.  No pericardial effusion. There is calcified atherosclerotic disease  involving the thoracic aorta as well as the LAD, left circumflex and  RCA coronary arteries.  ABDOMEN/PELVIS  No abnormal hypermetabolic activity within the liver, pancreas,  adrenal glands, or spleen. No hypermetabolic lymph nodes in the  abdomen or pelvis. Calcified atherosclerotic disease involves the  abdominal aorta.  SKELETON  No focal  hypermetabolic activity to suggest skeletal metastasis.  IMPRESSION:  1. There are at least 3 pulmonary nodules within the right upper  lobe, right lower lobe and left upper lobe are identified and  exhibit malignant range FDG uptake and are worrisome for malignancy.  2. Enlarged right paratracheal lymph node exhibits malignant range  activity and is worrisome for metastatic adenopathy.  3. No evidence for metastatic disease to the bones, abdomen or  pelvis.  4. Extensive atherosclerotic disease including multi vessel coronary  artery calcification.  5. Emphysema.  Electronically Signed  By: Kerby Moors M.D.  On: 10/17/2013 14:28    Impression: 73 year old gentleman with a history of heavy tobacco abuse as well as multiple other medical problems. He recently was incidentally found to have a left upper lobe mass on CT angiogram of his neck. Further workup with a PET CT showed at least 3 lung nodules that were hypermetabolic as well as hypermetabolic mediastinal adenopathy. This is very concerning for stage III lung cancer.  His lung nodules a relatively small and would be difficult to biopsy bronchoscopically. I do not think CT-guided biopsy would be a good first option given the severity of his COPD. I think our best option to establish a diagnosis is to biopsy his mediastinal lymph nodes.  My recommendation to him was that we proceed with a endobronchial ultrasound and possible mediastinitis be under general anesthesia. I described the procedure to him. He understands this would be a diagnostic and therapeutic procedure. He understands that there is no guarantee that a definitive diagnosis can be made. We discussed the indications, risks, benefits, and alternatives. He understands the risk include, but are not limited to those associated with general anesthesia, as well as procedure specific risks such as bleeding, pneumothorax, recurrent nerve injury leading to worsens, esophageal injury,  failure to make a diagnosis. There is also the possibility of unforeseeable complications.  He accepts the risks and wishes to proceed.  He'll need to be off his Coumadin for 5 days prior to surgery. Janalyn Shy will discuss whether he needs to be  bridged with Lovenox with Dr. Moshe Cipro.  Plan: Bronchoscopy, endobronchial ultrasound, possible mediastinitis be on Friday, 11/21/2013.

## 2013-11-12 ENCOUNTER — Other Ambulatory Visit: Payer: Self-pay

## 2013-11-12 ENCOUNTER — Telehealth: Payer: Self-pay | Admitting: *Deleted

## 2013-11-12 DIAGNOSIS — R918 Other nonspecific abnormal finding of lung field: Secondary | ICD-10-CM

## 2013-11-12 NOTE — Telephone Encounter (Signed)
Spoke with pt and he states his nurse arranges for his visits to coumadin clinic so called Alisha and she will arrange for him to be at the clinic tomorrow for instruction in lovenox bridging and she states she will give him the Lovenox and help with management of these injections

## 2013-11-12 NOTE — Telephone Encounter (Signed)
Message copied by Margretta Sidle on Wed Nov 12, 2013 12:12 PM ------      Message from: Kate Sable A      Created: Wed Nov 12, 2013 11:13 AM       Ok to hold warfarin. Given hypercoagulable state with probable bronchogenic CA, would recommend Lovenox bridging.            Jamesetta So            ----- Message -----         From: Margretta Sidle, RN         Sent: 11/12/2013  10:59 AM           To: Herminio Commons, MD            Please advise if ok to hold coumadin      Elbert Ewings RN      ----- Message -----         From: Army Melia, RN         Sent: 11/11/2013   5:10 PM           To: Margretta Sidle, RN            Hi Ivin Booty,             I just left a thoracic surgery appointment with [facility]. He is to have bronchoscopy/mediastinscopy by Dr. Merilynn Finland on  9/11. Dr. Roxan Hockey wants him off Coumadin starting this Saturday. Wanted to make sure you guys and Dr. Raliegh Ip know about this. Details in my faxed note.             Thank you,      Alisa              ------

## 2013-11-13 ENCOUNTER — Ambulatory Visit (INDEPENDENT_AMBULATORY_CARE_PROVIDER_SITE_OTHER): Payer: Medicare Other | Admitting: Pharmacist

## 2013-11-13 DIAGNOSIS — Z5181 Encounter for therapeutic drug level monitoring: Secondary | ICD-10-CM

## 2013-11-13 DIAGNOSIS — R079 Chest pain, unspecified: Secondary | ICD-10-CM

## 2013-11-13 DIAGNOSIS — I4729 Other ventricular tachycardia: Secondary | ICD-10-CM

## 2013-11-13 DIAGNOSIS — I4891 Unspecified atrial fibrillation: Secondary | ICD-10-CM

## 2013-11-13 DIAGNOSIS — I472 Ventricular tachycardia: Secondary | ICD-10-CM

## 2013-11-13 LAB — POCT INR: INR: 3.2

## 2013-11-13 MED ORDER — ENOXAPARIN SODIUM 100 MG/ML ~~LOC~~ SOLN: 100.0000 mg | SUBCUTANEOUS | Status: DC

## 2013-11-13 NOTE — Patient Instructions (Signed)
9/4- No Coumadin 9/5- Coumadin 1 tablet  9/6- No Coumadin  9/7- Start Lovenox 100mg  once daily in AM 9/8- Lovenox 100mg  once daily in AM 9/9- Lovenox 100mg  once daily in AM 9/10- Lovenox 100mg  once daily in AM  9/11- Day of Procedure.  Do not take any Lovenox or Coumadin  9/12- Coumadin 1 1/2 tablets AND Lovenox 100mg  once daily  9/13- Coumadin 1 1/2 tablets AND Lovenox 100mg  once daily  9/14- Coumadin 1 1/2 tablets AND Lovenox 100mg  once daily  9/15- Coumadin 1 tablet AND Lovenox 100mg  once daily  9/16- Recheck INR

## 2013-11-18 ENCOUNTER — Other Ambulatory Visit: Payer: Self-pay | Admitting: Family Medicine

## 2013-11-19 ENCOUNTER — Ambulatory Visit (HOSPITAL_COMMUNITY)
Admission: RE | Admit: 2013-11-19 | Discharge: 2013-11-19 | Disposition: A | Discharge status: Home / Self Care | Payer: Medicare Other | Source: Ambulatory Visit | Attending: Thoracic Surgery (Cardiothoracic Vascular Surgery) | Admitting: Thoracic Surgery (Cardiothoracic Vascular Surgery)

## 2013-11-19 ENCOUNTER — Encounter (HOSPITAL_COMMUNITY): Payer: Self-pay

## 2013-11-19 ENCOUNTER — Encounter (HOSPITAL_COMMUNITY)
Admission: RE | Admit: 2013-11-19 | Discharge: 2013-11-19 | Disposition: A | Discharge status: Home / Self Care | Payer: Medicare Other | Source: Ambulatory Visit | Attending: Thoracic Surgery (Cardiothoracic Vascular Surgery) | Admitting: Thoracic Surgery (Cardiothoracic Vascular Surgery)

## 2013-11-19 ENCOUNTER — Other Ambulatory Visit: Payer: Self-pay | Admitting: Family Medicine

## 2013-11-19 VITALS — BP 115/75 | HR 84 | Temp 98.4°F | Resp 20 | Ht 66.0 in | Wt 142.7 lb

## 2013-11-19 DIAGNOSIS — R918 Other nonspecific abnormal finding of lung field: Secondary | ICD-10-CM

## 2013-11-19 DIAGNOSIS — I2789 Other specified pulmonary heart diseases: Secondary | ICD-10-CM | POA: Insufficient documentation

## 2013-11-19 DIAGNOSIS — I4891 Unspecified atrial fibrillation: Secondary | ICD-10-CM | POA: Diagnosis not present

## 2013-11-19 DIAGNOSIS — Z01811 Encounter for preprocedural respiratory examination: Secondary | ICD-10-CM | POA: Diagnosis not present

## 2013-11-19 DIAGNOSIS — J449 Chronic obstructive pulmonary disease, unspecified: Secondary | ICD-10-CM | POA: Diagnosis not present

## 2013-11-19 DIAGNOSIS — J4489 Other specified chronic obstructive pulmonary disease: Secondary | ICD-10-CM | POA: Insufficient documentation

## 2013-11-19 DIAGNOSIS — I517 Cardiomegaly: Secondary | ICD-10-CM | POA: Diagnosis not present

## 2013-11-19 HISTORY — DX: Personal history of other medical treatment: Z92.89

## 2013-11-19 HISTORY — DX: Anesthesia of skin: R20.0

## 2013-11-19 HISTORY — DX: Personal history of other diseases of the respiratory system: Z87.09

## 2013-11-19 HISTORY — DX: Other nonspecific abnormal finding of lung field: R91.8

## 2013-11-19 HISTORY — DX: Anxiety disorder, unspecified: F41.9

## 2013-11-19 LAB — SURGICAL PCR SCREEN
MRSA, PCR: NEGATIVE
STAPHYLOCOCCUS AUREUS: NEGATIVE

## 2013-11-19 LAB — TYPE AND SCREEN
ABO/RH(D): O POS
ANTIBODY SCREEN: NEGATIVE

## 2013-11-19 LAB — URINALYSIS, ROUTINE W REFLEX MICROSCOPIC
Bilirubin Urine: NEGATIVE
Glucose, UA: NEGATIVE mg/dL
Hgb urine dipstick: NEGATIVE
Ketones, ur: NEGATIVE mg/dL
LEUKOCYTES UA: NEGATIVE
Nitrite: NEGATIVE
PH: 6 (ref 5.0–8.0)
Protein, ur: NEGATIVE mg/dL
SPECIFIC GRAVITY, URINE: 1.018 (ref 1.005–1.030)
UROBILINOGEN UA: 1 mg/dL (ref 0.0–1.0)

## 2013-11-19 LAB — CBC
HCT: 32.6 % — ABNORMAL LOW (ref 39.0–52.0)
Hemoglobin: 10 g/dL — ABNORMAL LOW (ref 13.0–17.0)
MCH: 27.2 pg (ref 26.0–34.0)
MCHC: 30.7 g/dL (ref 30.0–36.0)
MCV: 88.6 fL (ref 78.0–100.0)
PLATELETS: 209 10*3/uL (ref 150–400)
RBC: 3.68 MIL/uL — ABNORMAL LOW (ref 4.22–5.81)
RDW: 17.2 % — ABNORMAL HIGH (ref 11.5–15.5)
WBC: 4.7 10*3/uL (ref 4.0–10.5)

## 2013-11-19 LAB — COMPREHENSIVE METABOLIC PANEL
ALBUMIN: 3.5 g/dL (ref 3.5–5.2)
ALT: 9 U/L (ref 0–53)
AST: 18 U/L (ref 0–37)
Alkaline Phosphatase: 126 U/L — ABNORMAL HIGH (ref 39–117)
Anion gap: 12 (ref 5–15)
BILIRUBIN TOTAL: 0.4 mg/dL (ref 0.3–1.2)
BUN: 18 mg/dL (ref 6–23)
CHLORIDE: 97 meq/L (ref 96–112)
CO2: 29 meq/L (ref 19–32)
Calcium: 9.4 mg/dL (ref 8.4–10.5)
Creatinine, Ser: 1.14 mg/dL (ref 0.50–1.35)
GFR calc Af Amer: 72 mL/min — ABNORMAL LOW (ref >90)
GFR, EST NON AFRICAN AMERICAN: 62 mL/min — AB (ref >90)
Glucose, Bld: 93 mg/dL (ref 70–99)
Potassium: 4.1 mEq/L (ref 3.7–5.3)
SODIUM: 138 meq/L (ref 137–147)
Total Protein: 7.6 g/dL (ref 6.0–8.3)

## 2013-11-19 LAB — APTT: aPTT: 42 seconds — ABNORMAL HIGH (ref 24–37)

## 2013-11-19 LAB — PROTIME-INR
INR: 1.25 (ref 0.00–1.49)
Prothrombin Time: 15.7 seconds — ABNORMAL HIGH (ref 11.6–15.2)

## 2013-11-19 MED ORDER — CHLORHEXIDINE GLUCONATE 4 % EX LIQD: 1.0000 "application " | Freq: Once | CUTANEOUS | Status: DC

## 2013-11-19 NOTE — Progress Notes (Addendum)
Cardiologist is Dr.Koneswaran with last visit in epic from 10/2013  Multiple echo reports in epic-most recent in 2015  Stress test report in epic from 2015  [place] is Dr.Margaret Moshe Cipro

## 2013-11-19 NOTE — Pre-Procedure Instructions (Signed)
Tilak E Goeden  11/19/2013   Your procedure is scheduled on:  Fri, Sept 11 @ 9:45 AM  Report to [facility]  at 7:45 AM.  Call this number if you have problems the morning of surgery: [phone]   Remember:   Do not eat food or drink liquids after midnight.   Take these medicines the morning of surgery with A SIP OF WATER: Albuterol Neb and Inhaler(if needed)<Bring Your Inhaler With You>,Allopurinol(Zyloprim),Xanax(Alprazolam),Symbicort,Cardizem(Diltiazem),Metoprolol(Lopressor),Spiriva,and Ultram(Tramadol-if needed)                  Stop taking your Coumadin as you have been instructed. No Goody's,BC's,Aleve,Aspirin,Ibuprofen,Fish Oil,or any Herbal Medications   Do not wear jewelry  Do not wear lotions, powders, or colognes. You may wear deodorant.  Men may shave face and neck.  Do not bring valuables to the hospital.  [facility] is not responsible                  for any belongings or valuables.               Contacts, dentures or bridgework may not be worn into surgery.  Leave suitcase in the car. After surgery it may be brought to your room.  For patients admitted to the hospital, discharge time is determined by your                treatment team.                 Special Instructions:  [place] - Preparing for Surgery  Before surgery, you can play an important role.  Because skin is not sterile, your skin needs to be as free of germs as possible.  You can reduce the number of germs on you skin by washing with CHG (chlorahexidine gluconate) soap before surgery.  CHG is an antiseptic cleaner which kills germs and bonds with the skin to continue killing germs even after washing.  Please DO NOT use if you have an allergy to CHG or antibacterial soaps.  If your skin becomes reddened/irritated stop using the CHG and inform your nurse when you arrive at Short Stay.  Do not shave (including legs and underarms) for at least 48 hours prior to the first CHG shower.  You may  shave your face.  Please follow these instructions carefully:   1.  Shower with CHG Soap the night before surgery and the                                morning of Surgery.  2.  If you choose to wash your hair, wash your hair first as usual with your       normal shampoo.  3.  After you shampoo, rinse your hair and body thoroughly to remove the                      Shampoo.  4.  Use CHG as you would any other liquid soap.  You can apply chg directly       to the skin and wash gently with scrungie or a clean washcloth.  5.  Apply the CHG Soap to your body ONLY FROM THE NECK DOWN.        Do not use on open wounds or open sores.  Avoid contact with your eyes,       ears, mouth and genitals (private parts).  Wash genitals (private parts)       with your normal soap.  6.  Wash thoroughly, paying special attention to the area where your surgery        will be performed.  7.  Thoroughly rinse your body with warm water from the neck down.  8.  DO NOT shower/wash with your normal soap after using and rinsing off       the CHG Soap.  9.  Pat yourself dry with a clean towel.            10.  Wear clean pajamas.            11.  Place clean sheets on your bed the night of your first shower and do not        sleep with pets.  Day of Surgery  Do not apply any lotions/deoderants the morning of surgery.  Please wear clean clothes to the hospital/surgery center.     Please read over the following fact sheets that you were given: Pain Booklet, Coughing and Deep Breathing, Blood Transfusion Information, MRSA Information and Surgical Site Infection Prevention

## 2013-11-20 MED ORDER — DEXTROSE 5 % IV SOLN
1.5000 g | INTRAVENOUS | Status: AC
  Administered 2013-11-21: 1.5 g via INTRAVENOUS
  Filled 2013-11-20: qty 1.5

## 2013-11-20 NOTE — Progress Notes (Signed)
Anesthesia chart review: Patient is a 73 year old male scheduled for endobronchial ultrasound, possible mediastinoscopy on 11/21/13 by Dr. Roxan Hockey.  History includes bilateral lung nodules, smoking, afib, diastolic CHF, severe COPD on night and PRN home O2, severe pulmonary hypertension, exertional dyspnea, HTN, asthma, glaucoma, anemia, blood transfusion, anxiety, nasal surgery (embolization) due to epistaxis 07/10/13, hernia repair. PCP is Dr. Tula Nakayama. Cardiologist is Dr. Bronson Ing. He recommended Lovenox bridging while he is off Coumadin for surgery.  EKG on 11/19/13 showed: afib at 80 bpm, LAD, inferior infarct (age undetermined, occasional PVC. PVC is new when compared to prior EKG.  Nuclear stress test on 08/08/13 showed: Overall low risk Lexiscan Cardiolite. There were no diagnostic ST segment changes. Atrial fibrillation persisted throughout, and there was a 4 beat run of NSVT noted. Perfusion imaging is most consistent with diaphragmatic attenuation, cannot rule out inferior scar, no large ischemic zones are noted however. LVEF calculated at 50%.  Echo on 06/17/13 showed: Moderate LVH with normal LV chamber size and LVEF approximately 50%, probable grade 2 diastolic dysfunction. Severe left atrial enlargement. Mild mitral regurgitation. Moderately reduced RV contraction with moderate tricuspid regurgitation. Severely elevated PASP 74 mmHg with marked right atrial enlargement and elevated CVP. MIldly ectatic aortic root.  PET scan on 10/17/13 showed: 1. There are at least 3 pulmonary nodules within the right upper lobe, right lower lobe and left upper lobe are identified and exhibit malignant range FDG uptake and are worrisome for malignancy.  2. Enlarged right paratracheal lymph node exhibits malignant range activity and is worrisome for metastatic adenopathy.  3. No evidence for metastatic disease to the bones, abdomen or pelvis.  4. Extensive atherosclerotic disease including multi  vessel coronary artery calcification.  5. Emphysema.  CXR on 11/19/13 showed: Stable cardiomegaly, chronic pulmonary venous hypertension, and left lower lobe scarring. No acute findings.   Preoperative labs noted. H/H 10.0/32.6, up from 10/27/13.  Patient with with significant past medical history as listed.  Now with findings concerning for lung cancer and need biopsy.  His cardiologist is aware of plans for surgery.  He had recent stress test and echo.  He tolerated GA 5 months ago.  If no acute changes then I would anticipate that he could proceed as planned.  [facility] Short Stay Center/Anesthesiology Phone [phone] 11/20/2013 11:21 AM

## 2013-11-21 ENCOUNTER — Encounter (HOSPITAL_COMMUNITY)
Admission: RE | Disposition: A | Discharge status: Home / Self Care | Payer: Self-pay | Source: Ambulatory Visit | Attending: Thoracic Surgery (Cardiothoracic Vascular Surgery)

## 2013-11-21 ENCOUNTER — Other Ambulatory Visit (HOSPITAL_COMMUNITY): Payer: Medicare Other

## 2013-11-21 ENCOUNTER — Encounter (HOSPITAL_COMMUNITY): Payer: Medicare Other | Admitting: Vascular Surgery

## 2013-11-21 ENCOUNTER — Encounter (HOSPITAL_COMMUNITY): Payer: Self-pay | Admitting: Certified Registered Nurse Anesthetist

## 2013-11-21 ENCOUNTER — Ambulatory Visit (HOSPITAL_COMMUNITY): Payer: Medicare Other

## 2013-11-21 ENCOUNTER — Observation Stay (HOSPITAL_COMMUNITY)
Admission: RE | Admit: 2013-11-21 | Discharge: 2013-11-22 | Disposition: A | Discharge status: Home / Self Care | Payer: Medicare Other | Source: Ambulatory Visit | Attending: Thoracic Surgery (Cardiothoracic Vascular Surgery) | Admitting: Thoracic Surgery (Cardiothoracic Vascular Surgery)

## 2013-11-21 ENCOUNTER — Ambulatory Visit (HOSPITAL_COMMUNITY): Payer: Medicare Other | Admitting: Certified Registered Nurse Anesthetist

## 2013-11-21 DIAGNOSIS — R911 Solitary pulmonary nodule: Secondary | ICD-10-CM | POA: Diagnosis present

## 2013-11-21 DIAGNOSIS — Z9981 Dependence on supplemental oxygen: Secondary | ICD-10-CM | POA: Diagnosis not present

## 2013-11-21 DIAGNOSIS — I4891 Unspecified atrial fibrillation: Secondary | ICD-10-CM | POA: Diagnosis not present

## 2013-11-21 DIAGNOSIS — R599 Enlarged lymph nodes, unspecified: Secondary | ICD-10-CM | POA: Diagnosis not present

## 2013-11-21 DIAGNOSIS — Z79899 Other long term (current) drug therapy: Secondary | ICD-10-CM | POA: Diagnosis not present

## 2013-11-21 DIAGNOSIS — M8448XA Pathological fracture, other site, initial encounter for fracture: Secondary | ICD-10-CM | POA: Diagnosis present

## 2013-11-21 DIAGNOSIS — I2789 Other specified pulmonary heart diseases: Secondary | ICD-10-CM | POA: Diagnosis not present

## 2013-11-21 DIAGNOSIS — R222 Localized swelling, mass and lump, trunk: Secondary | ICD-10-CM

## 2013-11-21 DIAGNOSIS — I509 Heart failure, unspecified: Secondary | ICD-10-CM | POA: Insufficient documentation

## 2013-11-21 DIAGNOSIS — F411 Generalized anxiety disorder: Secondary | ICD-10-CM | POA: Insufficient documentation

## 2013-11-21 DIAGNOSIS — I5032 Chronic diastolic (congestive) heart failure: Secondary | ICD-10-CM | POA: Insufficient documentation

## 2013-11-21 DIAGNOSIS — J449 Chronic obstructive pulmonary disease, unspecified: Secondary | ICD-10-CM | POA: Insufficient documentation

## 2013-11-21 DIAGNOSIS — R918 Other nonspecific abnormal finding of lung field: Secondary | ICD-10-CM | POA: Diagnosis not present

## 2013-11-21 DIAGNOSIS — I10 Essential (primary) hypertension: Secondary | ICD-10-CM | POA: Diagnosis not present

## 2013-11-21 DIAGNOSIS — Z7901 Long term (current) use of anticoagulants: Secondary | ICD-10-CM | POA: Diagnosis not present

## 2013-11-21 DIAGNOSIS — J4489 Other specified chronic obstructive pulmonary disease: Secondary | ICD-10-CM | POA: Insufficient documentation

## 2013-11-21 DIAGNOSIS — F17200 Nicotine dependence, unspecified, uncomplicated: Secondary | ICD-10-CM | POA: Diagnosis not present

## 2013-11-21 HISTORY — PX: VIDEO BRONCHOSCOPY WITH ENDOBRONCHIAL ULTRASOUND: SHX6177

## 2013-11-21 HISTORY — PX: MEDIASTINOSCOPY: SHX5086

## 2013-11-21 SURGERY — BRONCHOSCOPY, WITH EBUS
Anesthesia: General | Site: Neck

## 2013-11-21 MED ORDER — METOPROLOL TARTRATE 50 MG PO TABS
50.0000 mg | ORAL_TABLET | Freq: Two times a day (BID) | ORAL | Status: DC
  Administered 2013-11-21: 50 mg via ORAL
  Filled 2013-11-21 (×3): qty 1

## 2013-11-21 MED ORDER — ALBUTEROL SULFATE (2.5 MG/3ML) 0.083% IN NEBU: 3.0000 mL | INHALATION_SOLUTION | Freq: Four times a day (QID) | RESPIRATORY_TRACT | Status: DC | PRN

## 2013-11-21 MED ORDER — PROPOFOL 10 MG/ML IV BOLUS
INTRAVENOUS | Status: AC
  Filled 2013-11-21: qty 20

## 2013-11-21 MED ORDER — BRIMONIDINE TARTRATE 0.2 % OP SOLN
1.0000 [drp] | Freq: Two times a day (BID) | OPHTHALMIC | Status: DC
  Administered 2013-11-21: 1 [drp] via OPHTHALMIC
  Filled 2013-11-21: qty 5

## 2013-11-21 MED ORDER — GI COCKTAIL ~~LOC~~
30.0000 mL | Freq: Three times a day (TID) | ORAL | Status: DC | PRN
  Filled 2013-11-21: qty 30

## 2013-11-21 MED ORDER — TRAVOPROST (BAK FREE) 0.004 % OP SOLN
1.0000 [drp] | Freq: Every day | OPHTHALMIC | Status: DC
  Administered 2013-11-21: 1 [drp] via OPHTHALMIC
  Filled 2013-11-21: qty 2.5

## 2013-11-21 MED ORDER — ROCURONIUM BROMIDE 50 MG/5ML IV SOLN
INTRAVENOUS | Status: AC
  Filled 2013-11-21: qty 1

## 2013-11-21 MED ORDER — TORSEMIDE 20 MG PO TABS
40.0000 mg | ORAL_TABLET | Freq: Every day | ORAL | Status: DC
  Administered 2013-11-21: 40 mg via ORAL
  Filled 2013-11-21 (×2): qty 2

## 2013-11-21 MED ORDER — POTASSIUM CHLORIDE ER 10 MEQ PO TBCR
10.0000 meq | EXTENDED_RELEASE_TABLET | Freq: Every day | ORAL | Status: DC
  Administered 2013-11-21: 10 meq via ORAL
  Filled 2013-11-21 (×2): qty 1

## 2013-11-21 MED ORDER — ONDANSETRON HCL 4 MG/2ML IJ SOLN
INTRAMUSCULAR | Status: DC | PRN
  Administered 2013-11-21: 4 mg via INTRAVENOUS

## 2013-11-21 MED ORDER — BUDESONIDE-FORMOTEROL FUMARATE 160-4.5 MCG/ACT IN AERO
2.0000 | INHALATION_SPRAY | Freq: Two times a day (BID) | RESPIRATORY_TRACT | Status: DC
  Administered 2013-11-21: 2 via RESPIRATORY_TRACT
  Filled 2013-11-21: qty 6

## 2013-11-21 MED ORDER — BRIMONIDINE TARTRATE-TIMOLOL 0.2-0.5 % OP SOLN
1.0000 [drp] | Freq: Two times a day (BID) | OPHTHALMIC | Status: DC
  Filled 2013-11-21: qty 5

## 2013-11-21 MED ORDER — LACTATED RINGERS IV SOLN
INTRAVENOUS | Status: DC
  Administered 2013-11-21: 09:00:00 via INTRAVENOUS

## 2013-11-21 MED ORDER — LIDOCAINE HCL 4 % MT SOLN
OROMUCOSAL | Status: DC | PRN
  Administered 2013-11-21: 4 mL via TOPICAL

## 2013-11-21 MED ORDER — FERROUS SULFATE 325 (65 FE) MG PO TABS
325.0000 mg | ORAL_TABLET | Freq: Two times a day (BID) | ORAL | Status: DC
  Administered 2013-11-21: 325 mg via ORAL
  Filled 2013-11-21 (×4): qty 1

## 2013-11-21 MED ORDER — MIRTAZAPINE 15 MG PO TABS
15.0000 mg | ORAL_TABLET | Freq: Every day | ORAL | Status: DC
  Administered 2013-11-21: 15 mg via ORAL
  Filled 2013-11-21 (×2): qty 1

## 2013-11-21 MED ORDER — 0.9 % SODIUM CHLORIDE (POUR BTL) OPTIME
TOPICAL | Status: DC | PRN
  Administered 2013-11-21: 2000 mL

## 2013-11-21 MED ORDER — ALBUTEROL SULFATE (2.5 MG/3ML) 0.083% IN NEBU
2.5000 mg | INHALATION_SOLUTION | Freq: Once | RESPIRATORY_TRACT | Status: AC
  Administered 2013-11-21: 2.5 mg via RESPIRATORY_TRACT

## 2013-11-21 MED ORDER — WARFARIN SODIUM 4 MG PO TABS: 4.0000 mg | ORAL_TABLET | Freq: Every day | ORAL | Status: DC

## 2013-11-21 MED ORDER — TRAMADOL HCL 50 MG PO TABS: 50.0000 mg | ORAL_TABLET | Freq: Four times a day (QID) | ORAL | Status: DC | PRN

## 2013-11-21 MED ORDER — HEMOSTATIC AGENTS (NO CHARGE) OPTIME
TOPICAL | Status: DC | PRN
  Administered 2013-11-21: 1 via TOPICAL

## 2013-11-21 MED ORDER — OXYCODONE HCL 5 MG PO TABS: 5.0000 mg | ORAL_TABLET | Freq: Four times a day (QID) | ORAL | Status: DC | PRN

## 2013-11-21 MED ORDER — METOPROLOL TARTRATE 50 MG PO TABS
ORAL_TABLET | ORAL | Status: AC
  Filled 2013-11-21: qty 1

## 2013-11-21 MED ORDER — LIDOCAINE HCL (CARDIAC) 20 MG/ML IV SOLN
INTRAVENOUS | Status: DC | PRN
  Administered 2013-11-21: 60 mg via INTRAVENOUS
  Administered 2013-11-21: 40 mg via INTRAVENOUS

## 2013-11-21 MED ORDER — WARFARIN - PHYSICIAN DOSING INPATIENT
Freq: Every day | Status: DC
  Administered 2013-11-21: 18:00:00

## 2013-11-21 MED ORDER — HYDROMORPHONE HCL PF 1 MG/ML IJ SOLN: 0.2500 mg | INTRAMUSCULAR | Status: DC | PRN

## 2013-11-21 MED ORDER — ONDANSETRON HCL 4 MG/2ML IJ SOLN
INTRAMUSCULAR | Status: AC
  Filled 2013-11-21: qty 2

## 2013-11-21 MED ORDER — GLYCOPYRROLATE 0.2 MG/ML IJ SOLN
INTRAMUSCULAR | Status: AC
  Filled 2013-11-21: qty 3

## 2013-11-21 MED ORDER — METOPROLOL TARTRATE 50 MG PO TABS
50.0000 mg | ORAL_TABLET | Freq: Once | ORAL | Status: AC
  Administered 2013-11-21: 50 mg via ORAL

## 2013-11-21 MED ORDER — ALPRAZOLAM 0.25 MG PO TABS: 0.2500 mg | ORAL_TABLET | Freq: Every day | ORAL | Status: DC | PRN

## 2013-11-21 MED ORDER — WARFARIN SODIUM 6 MG PO TABS: 6.0000 mg | ORAL_TABLET | ORAL | Status: DC

## 2013-11-21 MED ORDER — FENTANYL CITRATE 0.05 MG/ML IJ SOLN
INTRAMUSCULAR | Status: AC
  Filled 2013-11-21: qty 5

## 2013-11-21 MED ORDER — HYDRALAZINE HCL 25 MG PO TABS
25.0000 mg | ORAL_TABLET | Freq: Three times a day (TID) | ORAL | Status: DC
  Administered 2013-11-21 – 2013-11-22 (×2): 25 mg via ORAL
  Filled 2013-11-21 (×6): qty 1

## 2013-11-21 MED ORDER — LACTATED RINGERS IV SOLN
INTRAVENOUS | Status: DC | PRN
  Administered 2013-11-21 (×2): via INTRAVENOUS

## 2013-11-21 MED ORDER — LIDOCAINE HCL (CARDIAC) 20 MG/ML IV SOLN
INTRAVENOUS | Status: AC
  Filled 2013-11-21: qty 10

## 2013-11-21 MED ORDER — GLYCOPYRROLATE 0.2 MG/ML IJ SOLN
INTRAMUSCULAR | Status: DC | PRN
  Administered 2013-11-21: .6 mg via INTRAVENOUS

## 2013-11-21 MED ORDER — TIMOLOL MALEATE 0.5 % OP SOLN
1.0000 [drp] | Freq: Two times a day (BID) | OPHTHALMIC | Status: DC
  Administered 2013-11-21: 1 [drp] via OPHTHALMIC
  Filled 2013-11-21: qty 5

## 2013-11-21 MED ORDER — TIOTROPIUM BROMIDE MONOHYDRATE 18 MCG IN CAPS
18.0000 ug | ORAL_CAPSULE | Freq: Every day | RESPIRATORY_TRACT | Status: DC
Start: 2013-11-22 — End: 2013-11-22
  Filled 2013-11-21: qty 5

## 2013-11-21 MED ORDER — ALBUTEROL SULFATE (2.5 MG/3ML) 0.083% IN NEBU
INHALATION_SOLUTION | RESPIRATORY_TRACT | Status: AC
  Filled 2013-11-21: qty 3

## 2013-11-21 MED ORDER — NEOSTIGMINE METHYLSULFATE 10 MG/10ML IV SOLN
INTRAVENOUS | Status: DC | PRN
  Administered 2013-11-21: 4 mg via INTRAVENOUS

## 2013-11-21 MED ORDER — EPINEPHRINE HCL 1 MG/ML IJ SOLN
INTRAMUSCULAR | Status: AC
  Filled 2013-11-21: qty 1

## 2013-11-21 MED ORDER — WARFARIN SODIUM 4 MG PO TABS
4.0000 mg | ORAL_TABLET | ORAL | Status: DC
  Administered 2013-11-21: 4 mg via ORAL
  Filled 2013-11-21 (×2): qty 1

## 2013-11-21 MED ORDER — PHENYLEPHRINE HCL 10 MG/ML IJ SOLN
10.0000 mg | INTRAVENOUS | Status: DC | PRN
  Administered 2013-11-21: 20 ug/min via INTRAVENOUS

## 2013-11-21 MED ORDER — PROPOFOL 10 MG/ML IV BOLUS
INTRAVENOUS | Status: DC | PRN
  Administered 2013-11-21: 50 mg via INTRAVENOUS
  Administered 2013-11-21: 40 mg via INTRAVENOUS
  Administered 2013-11-21: 20 mg via INTRAVENOUS
  Administered 2013-11-21: 120 mg via INTRAVENOUS
  Administered 2013-11-21: 40 mg via INTRAVENOUS

## 2013-11-21 MED ORDER — ACETAMINOPHEN 325 MG PO TABS: 650.0000 mg | ORAL_TABLET | Freq: Four times a day (QID) | ORAL | Status: DC | PRN

## 2013-11-21 MED ORDER — MIDAZOLAM HCL 2 MG/2ML IJ SOLN
INTRAMUSCULAR | Status: AC
  Filled 2013-11-21: qty 2

## 2013-11-21 MED ORDER — DILTIAZEM HCL ER COATED BEADS 180 MG PO CP24
180.0000 mg | ORAL_CAPSULE | Freq: Every day | ORAL | Status: DC
  Administered 2013-11-21: 180 mg via ORAL
  Filled 2013-11-21 (×2): qty 1

## 2013-11-21 MED ORDER — FENTANYL CITRATE 0.05 MG/ML IJ SOLN
INTRAMUSCULAR | Status: DC | PRN
  Administered 2013-11-21 (×2): 25 ug via INTRAVENOUS
  Administered 2013-11-21: 75 ug via INTRAVENOUS
  Administered 2013-11-21: 25 ug via INTRAVENOUS
  Administered 2013-11-21: 50 ug via INTRAVENOUS

## 2013-11-21 MED ORDER — ALLOPURINOL 300 MG PO TABS
300.0000 mg | ORAL_TABLET | Freq: Every day | ORAL | Status: DC
  Administered 2013-11-21: 300 mg via ORAL
  Filled 2013-11-21 (×2): qty 1

## 2013-11-21 MED ORDER — ROCURONIUM BROMIDE 100 MG/10ML IV SOLN
INTRAVENOUS | Status: DC | PRN
  Administered 2013-11-21: 40 mg via INTRAVENOUS

## 2013-11-21 SURGICAL SUPPLY — 59 items
ADH SKN CLS APL DERMABOND .7 (GAUZE/BANDAGES/DRESSINGS) ×2
APPLIER CLIP LOGIC TI 5 (MISCELLANEOUS) IMPLANT
APR CLP MED LRG 33X5 (MISCELLANEOUS)
BLADE SURG 15 STRL LF DISP TIS (BLADE) IMPLANT
BLADE SURG 15 STRL SS (BLADE) ×4
CANISTER SUCTION 2500CC (MISCELLANEOUS) ×8 IMPLANT
CLIP TI MEDIUM 6 (CLIP) IMPLANT
CONT SPEC 4OZ CLIKSEAL STRL BL (MISCELLANEOUS) ×16 IMPLANT
COVER SURGICAL LIGHT HANDLE (MISCELLANEOUS) ×8 IMPLANT
COVER TABLE BACK 60X90 (DRAPES) ×4 IMPLANT
DERMABOND ADVANCED (GAUZE/BANDAGES/DRESSINGS) ×2
DERMABOND ADVANCED .7 DNX12 (GAUZE/BANDAGES/DRESSINGS) ×2 IMPLANT
DRAPE CHEST BREAST 15X10 FENES (DRAPES) ×4 IMPLANT
ELECT CAUTERY BLADE 6.4 (BLADE) ×2 IMPLANT
ELECT REM PT RETURN 9FT ADLT (ELECTROSURGICAL) ×4
ELECTRODE REM PT RTRN 9FT ADLT (ELECTROSURGICAL) ×2 IMPLANT
FORCEPS BIOP RJ4 1.8 (CUTTING FORCEPS) ×2 IMPLANT
GAUZE SPONGE 4X4 12PLY STRL (GAUZE/BANDAGES/DRESSINGS) ×4 IMPLANT
GAUZE SPONGE 4X4 16PLY XRAY LF (GAUZE/BANDAGES/DRESSINGS) ×4 IMPLANT
GLOVE SURG SIGNA 7.5 PF LTX (GLOVE) ×8 IMPLANT
GLOVE SURG SS PI 7.0 STRL IVOR (GLOVE) ×4 IMPLANT
GOWN STRL REUS W/ TWL LRG LVL3 (GOWN DISPOSABLE) ×2 IMPLANT
GOWN STRL REUS W/ TWL XL LVL3 (GOWN DISPOSABLE) ×4 IMPLANT
GOWN STRL REUS W/TWL LRG LVL3 (GOWN DISPOSABLE)
GOWN STRL REUS W/TWL XL LVL3 (GOWN DISPOSABLE) ×16
HEMOSTAT SURGICEL 2X14 (HEMOSTASIS) ×2 IMPLANT
KIT BASIN OR (CUSTOM PROCEDURE TRAY) ×4 IMPLANT
KIT ROOM TURNOVER OR (KITS) ×8 IMPLANT
MARKER SKIN DUAL TIP RULER LAB (MISCELLANEOUS) ×4 IMPLANT
NEEDLE SYS SONOTIP II EBUSTBNA (NEEDLE) ×4 IMPLANT
NS IRRIG 1000ML POUR BTL (IV SOLUTION) ×8 IMPLANT
OIL SILICONE PENTAX (PARTS (SERVICE/REPAIRS)) ×2 IMPLANT
PACK SURGICAL SETUP 50X90 (CUSTOM PROCEDURE TRAY) ×4 IMPLANT
PAD ARMBOARD 7.5X6 YLW CONV (MISCELLANEOUS) ×16 IMPLANT
PENCIL BUTTON HOLSTER BLD 10FT (ELECTRODE) ×4 IMPLANT
SPONGE INTESTINAL PEANUT (DISPOSABLE) IMPLANT
SUT SILK 2 0 TIES 10X30 (SUTURE) IMPLANT
SUT SILK 3 0 (SUTURE) ×4
SUT SILK 3-0 18XBRD TIE 12 (SUTURE) IMPLANT
SUT VIC AB 2-0 CT1 27 (SUTURE) ×4
SUT VIC AB 2-0 CT1 TAPERPNT 27 (SUTURE) IMPLANT
SUT VIC AB 3-0 SH 18 (SUTURE) IMPLANT
SUT VIC AB 3-0 SH 27 (SUTURE) ×4
SUT VIC AB 3-0 SH 27X BRD (SUTURE) ×2 IMPLANT
SUT VICRYL 4-0 PS2 18IN ABS (SUTURE) ×4 IMPLANT
SWAB COLLECTION DEVICE MRSA (MISCELLANEOUS) IMPLANT
SYR 20CC LL (SYRINGE) ×4 IMPLANT
SYR 20ML ECCENTRIC (SYRINGE) ×4 IMPLANT
SYR 5ML LL (SYRINGE) ×4 IMPLANT
SYR 5ML LUER SLIP (SYRINGE) ×4 IMPLANT
SYR CONTROL 10ML LL (SYRINGE) IMPLANT
SYRINGE 10CC LL (SYRINGE) ×4 IMPLANT
TOWEL OR 17X24 6PK STRL BLUE (TOWEL DISPOSABLE) ×8 IMPLANT
TOWEL OR 17X26 10 PK STRL BLUE (TOWEL DISPOSABLE) ×4 IMPLANT
TRAP SPECIMEN MUCOUS 40CC (MISCELLANEOUS) ×4 IMPLANT
TUBE ANAEROBIC SPECIMEN COL (MISCELLANEOUS) IMPLANT
TUBE CONNECTING 12'X1/4 (SUCTIONS) ×2
TUBE CONNECTING 12X1/4 (SUCTIONS) ×6 IMPLANT
WATER STERILE IRR 1000ML POUR (IV SOLUTION) ×4 IMPLANT

## 2013-11-21 NOTE — Transfer of Care (Signed)
Immediate Anesthesia Transfer of Care Note  Patient: Theodore [name]  Procedure(s) Performed: Procedure(s): VIDEO BRONCHOSCOPY WITH ENDOBRONCHIAL ULTRASOUND (N/A) MEDIASTINOSCOPY (N/A)  Patient Location: PACU  Anesthesia Type:General  Level of Consciousness: awake and alert   Airway & Oxygen Therapy: Patient Spontanous Breathing and Patient connected to nasal cannula oxygen  Post-op Assessment: Report given to PACU RN, Post -op Vital signs reviewed and stable and Patient moving all extremities X 4  Post vital signs: Reviewed and stable  Complications: No apparent anesthesia complications

## 2013-11-21 NOTE — H&P (View-Only) (Signed)
PCP is Tula Nakayama, MD Referring Provider is Farrel Gobble, MD  Chief Complaint  Patient presents with  . Lung Lesion    Surgical eval on multiple pulmonary nodules, PET Scan 10/17/13    HPI: [name] is a 73 year old gentleman sent for consultation regarding lung nodules and mediastinal adenopathy.  He is a 73 year old man with a complex medical history including chronic diastolic heart failure, chronic atrial fibrillation, heavy tobacco abuse (2 packs per day x55 years), COPD, hypertension, and pulmonary hypertension. He recently was being evaluated for recurrent epistaxis. He had 2 severe episodes requiring hospitalization and transfusion. He ultimately underwent embolization and has not had any further episodes since that procedure. As part of that procedure he had a CT angiogram of his neck and on that was noted to have a spiculated mass in his left upper lobe. He subsequently had a PET/CT. It showed at least 3 pulmonary nodules or hypermetabolic. In addition there were enlarged and hypermetabolic paratracheal lymph nodes.  He had smoked 2 packs per day until recently. He says he is currently down to 2 cigarettes a day. He does get short of breath with exertion. He is on home oxygen but mainly uses at night when he is asleep. He is on Coumadin for his chronic atrial fibrillation. He has been working with his [place] and his heart failure is currently well compensated.  ECoG/Zubrod= 2  Past Medical History  Diagnosis Date  . Anemia     Hemoglobin 11.6 in 04/2008 -> resolved   . Tobacco abuse   . Glaucoma   . Allergic rhinitis   . Atrial fibrillation     Coumadin  . Diastolic heart failure     LVEF 23-76%, rate 2 diastolic dysfunction 04/8313  . Pulmonary hypertension     70 mmHg 03/2012  . Shingles   . Essential hypertension, benign   . COPD (chronic obstructive pulmonary disease)   . Asthma     Past Surgical History  Procedure Laterality Date  .  Bilateral cataract surgery    . Herniorrhapy    . Tendon repair      Right hand surgical procedure for a tendon repair  . Colonoscopy N/A 11/29/2012    Procedure: COLONOSCOPY;  Surgeon: Danie Binder, MD;  [facility];  Service: Endoscopy;  Laterality: N/A;  1:00  . Esophagogastroduodenoscopy N/A 11/29/2012    Procedure: ESOPHAGOGASTRODUODENOSCOPY (EGD);  Surgeon: Danie Binder, MD;  Location: [facility];  Service: Endoscopy;  Laterality: N/A;  . Eye surgery    . Hernia repair    . Radiology with anesthesia N/A 07/10/2013    Procedure: EMBOLIZATION-RADIOLOGY WITH ANESTHESIA;  Surgeon: Rob Hickman, MD;  Location: [facility];  Service: Radiology;  Laterality: N/A;    Family History  Problem Relation Age of Onset  . Cancer Mother   . Cancer Father   . Coronary artery disease Brother   . Colon cancer Brother     Social History History  Substance Use Topics  . Smoking status: Current Some Day Smoker -- 2.00 packs/day for 55 years    Types: Cigarettes  . Smokeless tobacco: Never Used     Comment: says he is down to 2-3 cigarettes/ day  . Alcohol Use: No    Current Outpatient Prescriptions  Medication Sig Dispense Refill  . albuterol (PROAIR HFA) 108 (90 BASE) MCG/ACT inhaler Inhale 2 puffs into the lungs every 6 (six) hours as needed for wheezing or shortness of breath.  6.7 g  4  . albuterol (PROVENTIL) (2.5 MG/3ML) 0.083% nebulizer solution INHALE CONTENTS OF 1 VIAL VIA NEBULIZER EVERY 6 HOURS AS NEEDED  375 mL  1  . allopurinol (ZYLOPRIM) 300 MG tablet Take 1 tablet (300 mg total) by mouth daily.  30 tablet  6  . ALPRAZolam (XANAX) 0.25 MG tablet One tablet once daily as needed, for anxiety  30 tablet  4  . brimonidine-timolol (COMBIGAN) 0.2-0.5 % ophthalmic solution Place 1 drop into both eyes every 12 (twelve) hours.  5 mL  0  . budesonide-formoterol (SYMBICORT) 160-4.5 MCG/ACT inhaler Inhale 2 puffs into the lungs 2 (two) times daily.  1 Inhaler  4  .  diltiazem (CARDIZEM CD) 180 MG 24 hr capsule Take 1 capsule (180 mg total) by mouth daily.  30 capsule  6  . ferrous sulfate 325 (65 FE) MG tablet Take 325 mg by mouth 2 (two) times daily.      . metoprolol (LOPRESSOR) 50 MG tablet Take 1 tablet (50 mg total) by mouth 2 (two) times daily.  60 tablet  0  . montelukast (SINGULAIR) 10 MG tablet Take 10 mg by mouth at bedtime.      . potassium chloride (K-DUR) 10 MEQ tablet Take 10 mEq by mouth 2 (two) times daily.      Marland Kitchen SPIRIVA HANDIHALER 18 MCG inhalation capsule INHALE THE CONTENTS OF 1 CAPSULE USING HANDIHALER EVERY DAY  30 capsule  6  . temazepam (RESTORIL) 15 MG capsule TAKE 1 CAPSULE BY MOUTH EVERY NIGHT AT BEDTIME AS NEEDED FOR SLEEP  30 capsule  2  . torsemide (DEMADEX) 20 MG tablet Take 2 tablets (40 mg total) by mouth daily.  60 tablet  3  . traMADol (ULTRAM) 50 MG tablet Take 1 tablet (50 mg total) by mouth daily as needed.  30 tablet  0  . warfarin (COUMADIN) 4 MG tablet Take 1 tablet daily except 1 1/2 tablets on Mondays -Wednesday-friday or as directed       No current facility-administered medications for this visit.    Allergies  Allergen Reactions  . Aspirin Other (See Comments)    On coumadin    Review of Systems  Constitutional: Positive for fatigue. Negative for fever.  Respiratory: Positive for cough and shortness of breath.        Home oxygen at night  Hematological: Bruises/bleeds easily (on coumadin, embolization for recurrent epistaxis).  Psychiatric/Behavioral: The patient is nervous/anxious.   All other systems reviewed and are negative.   BP 134/74  Pulse 73  Resp 20  Ht 5\' 6"  (1.676 m)  Wt 145 lb (65.772 kg)  BMI 23.41 kg/m2  SpO2 % Physical Exam  Vitals reviewed. Constitutional: He is oriented to person, place, and time. No distress.  HENT:  Head: Normocephalic and atraumatic.  Eyes: Pupils are equal, round, and reactive to light.  Neck: Neck supple. No thyromegaly present.  Cardiovascular:  Normal rate and regular rhythm.  Exam reveals gallop (+S4).   No murmur heard. Pulmonary/Chest: Effort normal and breath sounds normal.  Abdominal: Soft. There is no tenderness.  Musculoskeletal: He exhibits edema.  Lymphadenopathy:    He has no cervical adenopathy.  Neurological: He is alert and oriented to person, place, and time. No cranial nerve deficit.  Skin: Skin is warm and dry.     Diagnostic Tests: NUCLEAR MEDICINE PET SKULL BASE TO THIGH  TECHNIQUE:  8.0 mCi F-18 FDG was injected intravenously. Full-ring PET imaging  was performed from the skull  base to thigh after the radiotracer. CT  data was obtained and used for attenuation correction and anatomic  localization.  FASTING BLOOD GLUCOSE: Value: 90 mg/dl  COMPARISON: None.  FINDINGS:  NECK  No hypermetabolic lymph nodes in the neck.  CHEST  Moderate to advanced changes of centrilobular and paraseptal  emphysema identified. 9 mm nodule within the left upper lobe has an  SUV max equal to 4.2. Within the right upper lobe there is a 7 mm  nodule within SUV max equal to 1.9. Within the superior segment of  the right lower lobe there is a nodule measuring 7 mm. This has an  SUV max equal to 1.9. There is a tiny nodule in the right upper lobe  measuring 4 mm. This is far too small to characterize by PET-CT.  Enlarged right paratracheal lymph node measures 1.8 cm. The SUV max  associated with this lymph node is equal to 2.8. Left paratracheal  lymph node has an SUV max equal to 3.0. The heart size is enlarged.  No pericardial effusion. There is calcified atherosclerotic disease  involving the thoracic aorta as well as the LAD, left circumflex and  RCA coronary arteries.  ABDOMEN/PELVIS  No abnormal hypermetabolic activity within the liver, pancreas,  adrenal glands, or spleen. No hypermetabolic lymph nodes in the  abdomen or pelvis. Calcified atherosclerotic disease involves the  abdominal aorta.  SKELETON  No focal  hypermetabolic activity to suggest skeletal metastasis.  IMPRESSION:  1. There are at least 3 pulmonary nodules within the right upper  lobe, right lower lobe and left upper lobe are identified and  exhibit malignant range FDG uptake and are worrisome for malignancy.  2. Enlarged right paratracheal lymph node exhibits malignant range  activity and is worrisome for metastatic adenopathy.  3. No evidence for metastatic disease to the bones, abdomen or  pelvis.  4. Extensive atherosclerotic disease including multi vessel coronary  artery calcification.  5. Emphysema.  Electronically Signed  By: Kerby Moors M.D.  On: 10/17/2013 14:28    Impression: 73 year old gentleman with a history of heavy tobacco abuse as well as multiple other medical problems. He recently was incidentally found to have a left upper lobe mass on CT angiogram of his neck. Further workup with a PET CT showed at least 3 lung nodules that were hypermetabolic as well as hypermetabolic mediastinal adenopathy. This is very concerning for stage III lung cancer.  His lung nodules a relatively small and would be difficult to biopsy bronchoscopically. I do not think CT-guided biopsy would be a good first option given the severity of his COPD. I think our best option to establish a diagnosis is to biopsy his mediastinal lymph nodes.  My recommendation to him was that we proceed with a endobronchial ultrasound and possible mediastinitis be under general anesthesia. I described the procedure to him. He understands this would be a diagnostic and therapeutic procedure. He understands that there is no guarantee that a definitive diagnosis can be made. We discussed the indications, risks, benefits, and alternatives. He understands the risk include, but are not limited to those associated with general anesthesia, as well as procedure specific risks such as bleeding, pneumothorax, recurrent nerve injury leading to worsens, esophageal injury,  failure to make a diagnosis. There is also the possibility of unforeseeable complications.  He accepts the risks and wishes to proceed.  He'll need to be off his Coumadin for 5 days prior to surgery. Janalyn Shy will discuss whether he needs to be  bridged with Lovenox with Dr. Moshe Cipro.  Plan: Bronchoscopy, endobronchial ultrasound, possible mediastinitis be on Friday, 11/21/2013.

## 2013-11-21 NOTE — Anesthesia Preprocedure Evaluation (Addendum)
Anesthesia Evaluation  Patient identified by MRN, date of birth, ID band Patient awake    Reviewed: Allergy & Precautions, H&P , NPO status , Patient's Chart, lab work & pertinent test results, reviewed documented beta blocker date and time   Airway Mallampati: I TM Distance: >3 FB Neck ROM: Full    Dental  (+) Dental Advisory Given, Edentulous Upper, Edentulous Lower   Pulmonary shortness of breath and with exertion, asthma , COPD COPD inhaler, Current Smoker,          Cardiovascular hypertension, Pt. on medications and Pt. on home beta blockers +CHF + dysrhythmias Atrial Fibrillation  7/15 TTE: EF 50-55%. Moderately reduced RV function with moderate TR, dilated RV and RA and severe pulmonary hypertension.   Neuro/Psych PSYCHIATRIC DISORDERS Anxiety negative neurological ROS     GI/Hepatic negative GI ROS, Neg liver ROS,   Endo/Other  negative endocrine ROS  Renal/GU negative Renal ROS     Musculoskeletal negative musculoskeletal ROS (+)   Abdominal   Peds  Hematology  (+) anemia ,   Anesthesia Other Findings   Reproductive/Obstetrics negative OB ROS                         Anesthesia Physical Anesthesia Plan  ASA: IV  Anesthesia Plan: General   Post-op Pain Management:    Induction: Intravenous  Airway Management Planned: Oral ETT  Additional Equipment:   Intra-op Plan:   Post-operative Plan: Extubation in OR  Informed Consent: I have reviewed the patients History and Physical, chart, labs and discussed the procedure including the risks, benefits and alternatives for the proposed anesthesia with the patient or authorized representative who has indicated his/her understanding and acceptance.   Dental advisory given  Plan Discussed with: CRNA, Anesthesiologist and Surgeon  Anesthesia Plan Comments:         Anesthesia Quick Evaluation

## 2013-11-21 NOTE — Brief Op Note (Signed)
11/21/2013  1:23 PM  PATIENT:  Theodore [name]  73 y.o. male  PRE-OPERATIVE DIAGNOSIS:  Bilateral lung nodules, mediastinal adenopathy  POST-OPERATIVE DIAGNOSIS:  Bilateral lung nodules, mediastinal adenopathy  PROCEDURE:  Procedure(s): VIDEO BRONCHOSCOPY WITH ENDOBRONCHIAL ULTRASOUND (N/A) MEDIASTINOSCOPY (N/A)  SURGEON:  Surgeon(s) and Role:    * Melrose Nakayama, MD - Primary  PHYSICIAN ASSISTANT:   ASSISTANTS: none   ANESTHESIA:   general  EBL:  Total I/O In: 1500 [I.V.:1500] Out: 50 [Blood:50]  BLOOD ADMINISTERED:none  DRAINS: none   LOCAL MEDICATIONS USED:  NONE  SPECIMEN:  Source of Specimen:  mediastinal lymph nodes  DISPOSITION OF SPECIMEN:  PATHOLOGY  COUNTS:  YES  PLAN OF CARE: Admit for overnight observation  PATIENT DISPOSITION:  PACU - hemodynamically stable.   Delay start of Pharmacological VTE agent (>24hrs) due to surgical blood loss or risk of bleeding: not applicable   FROZEN of 4R node- no tumor seen

## 2013-11-21 NOTE — Interval H&P Note (Signed)
History and Physical Interval Note:  For bronch, EBUS, possible mediastinoscopy He understands this is a diagnostic procedure, not therapeutic  11/21/2013 9:26 AM  Theodore [name]  has presented today for surgery, with the diagnosis of Bilateral lung nodules  The various methods of treatment have been discussed with the patient and family. After consideration of risks, benefits and other options for treatment, the patient has consented to  Procedure(s): VIDEO BRONCHOSCOPY WITH ENDOBRONCHIAL ULTRASOUND (N/A) MEDIASTINOSCOPY, possible (Bilateral) as a surgical intervention .  The patient's history has been reviewed, patient examined, no change in status, stable for surgery.  I have reviewed the patient's chart and labs.  Questions were answered to the patient's satisfaction.     [name]

## 2013-11-21 NOTE — Anesthesia Procedure Notes (Signed)
Procedure Name: Intubation Date/Time: 11/21/2013 10:24 AM Performed by: Blair Heys E Pre-anesthesia Checklist: Patient identified, Emergency Drugs available, Suction available and Patient being monitored Patient Re-evaluated:Patient Re-evaluated prior to inductionOxygen Delivery Method: Circle system utilized Preoxygenation: Pre-oxygenation with 100% oxygen Intubation Type: IV induction Ventilation: Mask ventilation without difficulty Laryngoscope Size: Miller and 2 Grade View: Grade I Tube type: Oral Tube size: 7.5 mm Number of attempts: 1 Airway Equipment and Method: Stylet and LTA kit utilized Placement Confirmation: ETT inserted through vocal cords under direct vision,  positive ETCO2 and breath sounds checked- equal and bilateral Secured at: 23 cm Tube secured with: Tape Dental Injury: Teeth and Oropharynx as per pre-operative assessment

## 2013-11-21 NOTE — Anesthesia Postprocedure Evaluation (Signed)
  Anesthesia Post-op Note  Patient: Theodore [name]  Procedure(s) Performed: Procedure(s): VIDEO BRONCHOSCOPY WITH ENDOBRONCHIAL ULTRASOUND (N/A) MEDIASTINOSCOPY (N/A)  Patient Location: PACU  Anesthesia Type:General  Level of Consciousness: awake  Airway and Oxygen Therapy: Patient Spontanous Breathing  Post-op Pain: mild  Post-op Assessment: Post-op Vital signs reviewed  Post-op Vital Signs: Reviewed  Last Vitals:  Filed Vitals:   11/21/13 1400  BP: 105/54  Pulse: 67  Temp:   Resp: 13    Complications: No apparent anesthesia complications

## 2013-11-21 NOTE — Progress Notes (Signed)
PHARMACIST - PHYSICIAN COMMUNICATION DR:  Roxan Hockey CONCERNING: Pharmacy Care Issues Regarding Warfarin Labs  RECOMMENDATION (Action Taken): A baseline and daily protime for three days has been ordered to meet the [facility] Patient safety goal and comply with the current [place].   The Pharmacy will defer all warfarin dose order changes and follow up of lab results to the prescriber unless an additional order to initiate a "pharmacy Coumadin consult" is placed.  DESCRIPTION:  While hospitalized, to be in compliance with [facility] [place] Patient Safety Goals, all patients on warfarin must have a baseline and/or current protime prior to the administration of warfarin. Pharmacy has received your order for warfarin without these required laboratory assessments.  Taylor, Pharm.D., BCPS Clinical Pharmacist Pager: [phone] 11/21/2013 3:33 PM

## 2013-11-21 NOTE — Discharge Instructions (Addendum)
Do not drive or engage in heavy physical activity for 24 hours  You may shower tomorrow  You already have prescription for oxycodone or tramadol for pain. You should take one or the other but not both. Take 1 to 2 tablets 4 times daily as needed for pain.  You may use tylenol in addition to or instead the other pain medications  There is a medical adhesive over the incision, it will begin to peel off in 7 to 10 days.  Resume coumadin tomorrow with your regular dose  My office will contact you with follow instructions

## 2013-11-21 NOTE — Progress Notes (Signed)
Pt stays at home with sons, but sister Veronda Prude [phone] will need to be called to send pt home. Pt has home o2, but is unsure if sister can get it from home to bring in. Pt receives home 02 from [facility]. Pt has clothes with dentures and eyeglasses at bedside. For any other questions, concerns, and when pt is discharge please contact [place], RN cell phone at [phone].

## 2013-11-22 DIAGNOSIS — R918 Other nonspecific abnormal finding of lung field: Secondary | ICD-10-CM | POA: Diagnosis not present

## 2013-11-22 LAB — PROTIME-INR
INR: 1.21 (ref 0.00–1.49)
Prothrombin Time: 15.3 seconds — ABNORMAL HIGH (ref 11.6–15.2)

## 2013-11-22 NOTE — Progress Notes (Signed)
Discharged to home with family office visits in place teaching done pt refused to take any of his morning meds

## 2013-11-22 NOTE — Progress Notes (Signed)
This encounter was created in error - please disregard.

## 2013-11-22 NOTE — Progress Notes (Signed)
UR completed 

## 2013-11-22 NOTE — Op Note (Signed)
NAMEDERRILL, BAGNELL NO.:  [id number]  MEDICAL RECORD NO.:  [id number]  LOCATION:  2W14C                        FACILITY:  [facility]  PHYSICIAN:  Revonda Standard. Roxan Hockey, M.D.DATE OF BIRTH:  12-10-1940  DATE OF PROCEDURE:  11/21/2013 DATE OF DISCHARGE:                              OPERATIVE REPORT   PREOPERATIVE DIAGNOSES:  Bilateral lung nodules and mediastinal adenopathy.  POSTOPERATIVE DIAGNOSES:  Bilateral lung nodules and mediastinal adenopathy.  PROCEDURES:  Bronchoscopy, endobronchial ultrasound, mediastinoscopy.  SURGEON:  Revonda Standard. Roxan Hockey, M.D.  ASSISTANT:  None.  ANESTHESIA:  General.  FINDINGS:  Enlarged mediastinal lymph nodes.  Needle aspirations and frozen section.  The 4R node revealed benign lymph nodes with no tumor seen.  CLINICAL NOTE:  Mr. Careaga is a 73 year old gentleman with a history of heavy tobacco abuse and multiple other medical problems.  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  The PET-CT showed three lung nodules that are hypermetabolic as well as enlarged and hypermetabolic mediastinal adenopathy.  He was advised to undergo bronchoscopy, endobronchial ultrasound, and mediastinoscopy for diagnostic purposes. The indications of risks, benefits, and alternatives were discussed in detail with the patient.  He understood and accepted the risks and agreed to proceed.  OPERATIVE NOTE:  Mr. Brigham was brought to the operating room on November 21, 2013.  He had induction of general anesthesia, and intravenous antibiotics were administered.  Flexible fiberoptic bronchoscopy was performed via the endotracheal tube that revealed normal endobronchial anatomy and no endobronchial lesions to the level of the subsegmental bronchi.  There were thick clear secretions.  The endobronchial ultrasound probe then was advanced through the endotracheal tube.  Systematic inspection of the mediastinal lymph nodes revealed  multiple enlarged nodes.  Level 4R node was aspirated first. With each aspiration, the needle was passed into the node and then 10-12 passes were performed with the needle with suction applied.  A portion of specimen was placed on slides and the remainder was placed in CytoLyt for permanent examination.  Three aspirations were performed of the initial 4R node.  Multiple aspirations then were performed of the level 7, 4L, and a second 4R node.  The slides were sent for quick prep, which subsequently returned showing lymphoid cells, but no tumor was seen.  As discussed with the patient preoperatively, decision then was made to proceed with mediastinoscopy.  The neck and chest were prepped and draped in the usual sterile fashion.  A transverse incision was made 1 fingerbreadth above the sternal notch.  It was carried through the skin and subcutaneous tissue.  The strap muscles were separated in the midline.  The pretracheal fascia was identified and incised, and the pretracheal plane was developed bluntly into the mediastinum. Mediastinoscope was inserted and again systematic inspection of the mediastinal lymph node stations was carried out.  The level 7 nodes were not accessible, but there were two enlarged 4R nodes as well as an enlarged 4L node that were accessible.  Multiple biopsies were performed of each of these nodes.  The initial biopsies were taken from the 4R node and these were sent for frozen section, which subsequently returned showing benign lymph node.  As  noted, the lymph nodes were enlarged, but otherwise did appear  normal.  Next, the 4L node was biopsied.  Minimal cautery was used in this area. Finally the second 4R node was biopsied.  The remaining specimens were all sent for permanent pathology.  Surgicel was applied at the sites where then biopsies had been performed.  The wound was packed with gauze.  After 5 minutes, the gauze packing was removed.  The mediastinoscope  was inserted.  There was no ongoing bleeding.  The mediastinoscope was removed.  The incision was closed in 2 layers with a 3-0 Vicryl subcutaneous suture and a 4-0 Vicryl subcuticular suture.  All sponge, needle, and instrument counts were correct at the end of the procedure.  The patient was taken from the operating room to the postanesthetic care unit in good condition.     Revonda Standard Roxan Hockey, M.D.     SCH/MEDQ  D:  11/21/2013  T:  11/22/2013  Job:  [id number]

## 2013-11-22 NOTE — Progress Notes (Addendum)
      [address]       [place] [address]             [phone]      1 Day Post-Op Procedure(s) (LRB): VIDEO BRONCHOSCOPY WITH ENDOBRONCHIAL ULTRASOUND (N/A) MEDIASTINOSCOPY (N/A) Subjective: Feels fine, no new issues   Objective: Vital signs in last 24 hours: Temp:  [96.9 F (36.1 C)-99.1 F (37.3 C)] 99.1 F (37.3 C) (09/12 0613) Pulse Rate:  [56-96] 76 (09/12 0613) Cardiac Rhythm:  [-] Atrial fibrillation (09/11 2134) Resp:  [11-23] 20 (09/12 0613) BP: (99-172)/(53-87) 106/55 mmHg (09/12 0613) SpO2:  [96 %-100 %] 100 % (09/12 8242) Weight:  [142 lb (64.411 kg)] 142 lb (64.411 kg) (09/11 0832)  Hemodynamic parameters for last 24 hours:    Intake/Output from previous day: 09/11 0701 - 09/12 0700 In: 1600 [I.V.:1600] Out: 250 [Urine:200; Blood:50] Intake/Output this shift:    General appearance: alert, cooperative and no distress Heart: irregularly irregular rhythm Lungs: clear to auscultation bilaterally Abdomen: benign Extremities: no edema Wound: incis healing well  Lab Results:  Recent Labs  11/19/13 1448  WBC 4.7  HGB 10.0*  HCT 32.6*  PLT 209   BMET:  Recent Labs  11/19/13 1448  NA 138  K 4.1  CL 97  CO2 29  GLUCOSE 93  BUN 18  CREATININE 1.14  CALCIUM 9.4    PT/INR:  Recent Labs  11/19/13 1448  LABPROT 15.7*  INR 1.25   ABG    Component Value Date/Time   PHART 7.415 05/15/2012 0145   HCO3 29.4* 05/15/2012 0145   TCO2 26.8 05/15/2012 0145   O2SAT 98.9 05/15/2012 0145   CBG (last 3)  No results found for this basename: GLUCAP,  in the last 72 hours  Meds Scheduled Meds: . allopurinol  300 mg Oral Daily  . brimonidine  1 drop Both Eyes Q12H   And  . timolol  1 drop Both Eyes Q12H  . budesonide-formoterol  2 puff Inhalation BID  . diltiazem  180 mg Oral Daily  . ferrous sulfate  325 mg Oral BID WC  . hydrALAZINE  25 mg Oral TID  . metoprolol  50 mg Oral BID  . mirtazapine  15 mg Oral QHS  . potassium  chloride  10 mEq Oral Daily  . tiotropium  18 mcg Inhalation Daily  . torsemide  40 mg Oral Daily  . Travoprost (BAK Free)  1 drop Both Eyes QHS  . warfarin  4 mg Oral Once per day on Sun Tue Thu Fri Sat   And  . [START ON 11/24/2013] warfarin  6 mg Oral Once per day on Mon Wed  . Warfarin - Physician Dosing Inpatient   Does not apply q1800   Continuous Infusions: . lactated ringers 50 mL/hr at 11/21/13 0921   PRN Meds:.acetaminophen, albuterol, ALPRAZolam, gi cocktail, oxyCODONE, traMADol  Xrays No results found.  Assessment/Plan: S/P Procedure(s) (LRB): VIDEO BRONCHOSCOPY WITH ENDOBRONCHIAL ULTRASOUND (N/A) MEDIASTINOSCOPY (N/A) Plan for discharge: see discharge orders Resume home coumadin dose  LOS: 1 day    Theodore [name] E 11/22/2013

## 2013-11-24 ENCOUNTER — Encounter (HOSPITAL_COMMUNITY): Payer: Self-pay | Admitting: Thoracic Surgery (Cardiothoracic Vascular Surgery)

## 2013-11-25 ENCOUNTER — Other Ambulatory Visit (HOSPITAL_COMMUNITY): Payer: Self-pay

## 2013-11-26 ENCOUNTER — Ambulatory Visit (INDEPENDENT_AMBULATORY_CARE_PROVIDER_SITE_OTHER): Payer: Medicare Other | Admitting: *Deleted

## 2013-11-26 DIAGNOSIS — Z5181 Encounter for therapeutic drug level monitoring: Secondary | ICD-10-CM

## 2013-11-26 DIAGNOSIS — I4729 Other ventricular tachycardia: Secondary | ICD-10-CM

## 2013-11-26 DIAGNOSIS — I4891 Unspecified atrial fibrillation: Secondary | ICD-10-CM

## 2013-11-26 DIAGNOSIS — I472 Ventricular tachycardia: Secondary | ICD-10-CM

## 2013-11-26 DIAGNOSIS — R079 Chest pain, unspecified: Secondary | ICD-10-CM

## 2013-11-26 LAB — POCT INR: INR: 1.6

## 2013-11-28 ENCOUNTER — Encounter (HOSPITAL_BASED_OUTPATIENT_CLINIC_OR_DEPARTMENT_OTHER): Payer: Medicare Other

## 2013-11-28 ENCOUNTER — Other Ambulatory Visit: Payer: Self-pay

## 2013-11-28 ENCOUNTER — Encounter (HOSPITAL_COMMUNITY): Payer: Self-pay

## 2013-11-28 ENCOUNTER — Encounter (HOSPITAL_COMMUNITY): Payer: Medicare Other | Attending: Hematology and Oncology

## 2013-11-28 VITALS — BP 129/77 | HR 73 | Temp 98.4°F | Resp 18 | Wt 142.8 lb

## 2013-11-28 DIAGNOSIS — I2789 Other specified pulmonary heart diseases: Secondary | ICD-10-CM | POA: Diagnosis not present

## 2013-11-28 DIAGNOSIS — I4891 Unspecified atrial fibrillation: Secondary | ICD-10-CM | POA: Insufficient documentation

## 2013-11-28 DIAGNOSIS — B029 Zoster without complications: Secondary | ICD-10-CM | POA: Insufficient documentation

## 2013-11-28 DIAGNOSIS — R918 Other nonspecific abnormal finding of lung field: Secondary | ICD-10-CM

## 2013-11-28 DIAGNOSIS — F17200 Nicotine dependence, unspecified, uncomplicated: Secondary | ICD-10-CM | POA: Insufficient documentation

## 2013-11-28 DIAGNOSIS — Z7901 Long term (current) use of anticoagulants: Secondary | ICD-10-CM | POA: Diagnosis not present

## 2013-11-28 DIAGNOSIS — Z9889 Other specified postprocedural states: Secondary | ICD-10-CM | POA: Insufficient documentation

## 2013-11-28 DIAGNOSIS — J438 Other emphysema: Secondary | ICD-10-CM

## 2013-11-28 DIAGNOSIS — R9389 Abnormal findings on diagnostic imaging of other specified body structures: Secondary | ICD-10-CM

## 2013-11-28 DIAGNOSIS — M109 Gout, unspecified: Secondary | ICD-10-CM | POA: Diagnosis not present

## 2013-11-28 DIAGNOSIS — I129 Hypertensive chronic kidney disease with stage 1 through stage 4 chronic kidney disease, or unspecified chronic kidney disease: Secondary | ICD-10-CM | POA: Insufficient documentation

## 2013-11-28 DIAGNOSIS — N182 Chronic kidney disease, stage 2 (mild): Secondary | ICD-10-CM | POA: Diagnosis not present

## 2013-11-28 DIAGNOSIS — R040 Epistaxis: Secondary | ICD-10-CM | POA: Diagnosis not present

## 2013-11-28 DIAGNOSIS — J309 Allergic rhinitis, unspecified: Secondary | ICD-10-CM | POA: Insufficient documentation

## 2013-11-28 DIAGNOSIS — Z79899 Other long term (current) drug therapy: Secondary | ICD-10-CM | POA: Insufficient documentation

## 2013-11-28 DIAGNOSIS — J4489 Other specified chronic obstructive pulmonary disease: Secondary | ICD-10-CM | POA: Insufficient documentation

## 2013-11-28 DIAGNOSIS — H409 Unspecified glaucoma: Secondary | ICD-10-CM | POA: Insufficient documentation

## 2013-11-28 DIAGNOSIS — I509 Heart failure, unspecified: Secondary | ICD-10-CM | POA: Insufficient documentation

## 2013-11-28 DIAGNOSIS — R942 Abnormal results of pulmonary function studies: Secondary | ICD-10-CM

## 2013-11-28 DIAGNOSIS — R599 Enlarged lymph nodes, unspecified: Secondary | ICD-10-CM | POA: Diagnosis present

## 2013-11-28 DIAGNOSIS — I5032 Chronic diastolic (congestive) heart failure: Secondary | ICD-10-CM | POA: Diagnosis not present

## 2013-11-28 DIAGNOSIS — F32A Depression, unspecified: Secondary | ICD-10-CM

## 2013-11-28 DIAGNOSIS — F329 Major depressive disorder, single episode, unspecified: Secondary | ICD-10-CM

## 2013-11-28 DIAGNOSIS — Z888 Allergy status to other drugs, medicaments and biological substances status: Secondary | ICD-10-CM | POA: Diagnosis not present

## 2013-11-28 DIAGNOSIS — D649 Anemia, unspecified: Secondary | ICD-10-CM | POA: Insufficient documentation

## 2013-11-28 DIAGNOSIS — J449 Chronic obstructive pulmonary disease, unspecified: Secondary | ICD-10-CM | POA: Diagnosis not present

## 2013-11-28 LAB — COMPREHENSIVE METABOLIC PANEL
ALT: 7 U/L (ref 0–53)
AST: 16 U/L (ref 0–37)
Albumin: 3.5 g/dL (ref 3.5–5.2)
Alkaline Phosphatase: 124 U/L — ABNORMAL HIGH (ref 39–117)
Anion gap: 12 (ref 5–15)
BILIRUBIN TOTAL: 0.4 mg/dL (ref 0.3–1.2)
BUN: 9 mg/dL (ref 6–23)
CO2: 34 mEq/L — ABNORMAL HIGH (ref 19–32)
Calcium: 9.5 mg/dL (ref 8.4–10.5)
Chloride: 92 mEq/L — ABNORMAL LOW (ref 96–112)
Creatinine, Ser: 1 mg/dL (ref 0.50–1.35)
GFR calc Af Amer: 84 mL/min — ABNORMAL LOW (ref >90)
GFR calc non Af Amer: 73 mL/min — ABNORMAL LOW (ref >90)
Glucose, Bld: 73 mg/dL (ref 70–99)
Potassium: 3.5 mEq/L — ABNORMAL LOW (ref 3.7–5.3)
SODIUM: 138 meq/L (ref 137–147)
TOTAL PROTEIN: 7.9 g/dL (ref 6.0–8.3)

## 2013-11-28 LAB — CBC WITH DIFFERENTIAL/PLATELET
Basophils Absolute: 0 10*3/uL (ref 0.0–0.1)
Basophils Relative: 0 % (ref 0–1)
EOS ABS: 0.2 10*3/uL (ref 0.0–0.7)
Eosinophils Relative: 3 % (ref 0–5)
HCT: 30.5 % — ABNORMAL LOW (ref 39.0–52.0)
HEMOGLOBIN: 9.6 g/dL — AB (ref 13.0–17.0)
LYMPHS PCT: 19 % (ref 12–46)
Lymphs Abs: 1.1 10*3/uL (ref 0.7–4.0)
MCH: 28.6 pg (ref 26.0–34.0)
MCHC: 31.5 g/dL (ref 30.0–36.0)
MCV: 90.8 fL (ref 78.0–100.0)
MONOS PCT: 24 % — AB (ref 3–12)
Monocytes Absolute: 1.4 10*3/uL — ABNORMAL HIGH (ref 0.1–1.0)
NEUTROS PCT: 54 % (ref 43–77)
Neutro Abs: 3.1 10*3/uL (ref 1.7–7.7)
PLATELETS: 286 10*3/uL (ref 150–400)
RBC: 3.36 MIL/uL — AB (ref 4.22–5.81)
RDW: 17.1 % — ABNORMAL HIGH (ref 11.5–15.5)
WBC: 5.7 10*3/uL (ref 4.0–10.5)

## 2013-11-28 NOTE — Patient Instructions (Signed)
[facility] Discharge Instructions  RECOMMENDATIONS MADE BY THE CONSULTANT AND ANY TEST RESULTS WILL BE SENT TO YOUR REFERRING PHYSICIAN.  We will see you in 3 months, for repeat lab work and a [place] [facility] appointment. You will need to repeat your CT Scans as ordered.  Please call for any questions or concerns.    Thank you for choosing [facility] to provide your oncology and hematology care.  To afford each patient quality time with our providers, please arrive at least 15 minutes before your scheduled appointment time.  With your help, our goal is to use those 15 minutes to complete the necessary work-up to ensure our physicians have the information they need to help with your evaluation and healthcare recommendations.    Effective January 1st, 2014, we ask that you re-schedule your appointment with our physicians should you arrive 10 or more minutes late for your appointment.  We strive to give you quality time with our providers, and arriving late affects you and other patients whose appointments are after yours.    Again, thank you for choosing [facility].  Our hope is that these requests will decrease the amount of time that you wait before being seen by our physicians.       _____________________________________________________________  Should you have questions after your visit to [facility], please contact our office at [phone] between the hours of 8:30 a.m. and 4:30 p.m.  Voicemails left after 4:30 p.m. will not be returned until the following business day.  For prescription refill requests, have your pharmacy contact our office with your prescription refill request.    _______________________________________________________________  We hope that we have given you very good care.  You may receive a patient satisfaction survey in the mail, please complete it and return it as soon as possible.  We value your  feedback!  _______________________________________________________________  Have you asked about our STAR program?  STAR stands for Survivorship Training and Rehabilitation, and this is a nationally recognized cancer care program that focuses on survivorship and rehabilitation.  Cancer and cancer treatments may cause problems, such as, pain, making you feel tired and keeping you from doing the things that you need or want to do. Cancer rehabilitation can help. Our goal is to reduce these troubling effects and help you have the best quality of life possible.  You may receive a survey from a nurse that asks questions about your current state of health.  Based on the survey results, all eligible patients will be referred to the [facility] program for an evaluation so we can better serve you!  A frequently asked questions sheet is available upon request.

## 2013-11-28 NOTE — Progress Notes (Signed)
LABS FOR [facility],CMP

## 2013-11-28 NOTE — Progress Notes (Signed)
[facility]  OFFICE PROGRESS NOTE  Theodore Nakayama, MD [address] [place] [address]  DIAGNOSIS: Abnormal CAT scan - Plan: CBC with Differential, Comprehensive metabolic panel, Lactate dehydrogenase, CT Chest W Contrast, CT Abdomen Pelvis W Contrast  Abnormal PET of right lung - Plan: CBC with Differential, Comprehensive metabolic panel, Lactate dehydrogenase  Other emphysema  Depression  Chief Complaint  Patient presents with   Abnormal CT of the chest    CURRENT THERAPY: Status post bronchoscopy and bronchoscopic ultrasound biopsy of paratracheal lymphadenopathy.  INTERVAL HISTORY: Theodore [name] 73 y.o. male returns for followup after recent bronchoscopy and bronchoscopic ultrasound and biopsy of paratracheal lymphadenopathy.  He tolerated bronchoscopy well with old specimen showing no evidence of malignancy. This included endoscopic ultrasound guided biopsy of paratracheal lymph nodes. He still has chronic cough and is using oxygen at night. Use inhalers during the day regularly. Has been good with no fever, night sweats, bone pain, abdominal pain, nausea, vomiting, diarrhea, constipation, lower extremity swelling or redness, skin rash, joint pain, headache, or seizures. He does live with a family member.  MEDICAL HISTORY: Past Medical History  Diagnosis Date   Glaucoma     uses eye drops daily   Allergic rhinitis     takes Singulair at bedtime   Atrial fibrillation     takes Coumadin daily   Diastolic heart failure     LVEF 50-35%, rate 2 diastolic dysfunction 06/6566   Pulmonary hypertension     70 mmHg 03/2012   Asthma     Albuterol neb and inhaler as needed   Anxiety     takes Xanax daily as needed   COPD (chronic obstructive pulmonary disease)     Symbicort daily   Essential hypertension, benign     takes Metoprolol and Diltiazem daily   Anemia     takes ferrous sulfate daily   History  of bronchitis    Shortness of breath     with exertion   Numbness     fingertips on both hands   Lung nodules    History of blood transfusion     INTERIM HISTORY: has HYPERTENSION; Atrial fibrillation; Encounter for long-term (current) use of other medications; Tobacco abuse disorder; Diastolic CHF with preserved left ventricular function, NYHA class 4; Epistaxis; Acute blood loss anemia; COPD (chronic obstructive pulmonary disease); Insomnia secondary to anxiety; Glaucoma; Depression with anxiety; Hyperuricemia; Routine general medical examination at a health care facility; CHF (congestive heart failure); Acute on chronic diastolic heart failure; Pulmonary arterial hypertension; Acute respiratory failure with hypoxia; Normocytic anemia; Leukocytosis, unspecified; COPD with acute exacerbation; NSVT (nonsustained ventricular tachycardia); Encounter for therapeutic drug monitoring; Epistaxis, recurrent; ARF (acute renal failure); Abnormal CAT scan; and Lung nodule on his problem list.    ALLERGIES:  is allergic to aspirin.  MEDICATIONS: has a current medication list which includes the following prescription(s): albuterol, albuterol, allopurinol, alprazolam, brimonidine-timolol, budesonide-formoterol, diltiazem, enoxaparin, ferrous sulfate, hydralazine, metoprolol, montelukast, oxycodone, potassium chloride, proair hfa, symbicort, tiotropium, torsemide, tramadol, travoprost (benzalkonium), mirtazapine, and warfarin.  SURGICAL HISTORY:  Past Surgical History  Procedure Laterality Date   Bilateral cataract surgery     Herniorrhapy     Tendon repair      Right hand surgical procedure for a tendon repair   Colonoscopy N/A 11/29/2012    Procedure: COLONOSCOPY;  Surgeon: Danie Binder, MD;  [facility];  Service: Endoscopy;  Laterality: N/A;  1:00   Esophagogastroduodenoscopy N/A 11/29/2012    Procedure: ESOPHAGOGASTRODUODENOSCOPY (EGD);  Surgeon: Danie Binder, MD;  Location[facility];  Service: Endoscopy;  Laterality: N/A;   Eye surgery     Hernia repair     Radiology with anesthesia N/A 07/10/2013    Procedure: EMBOLIZATION-RADIOLOGY WITH ANESTHESIA;  Surgeon: Rob Hickman, MD;  Location: [facility];  Service: Radiology;  Laterality: N/A;   Nose surgery      d/t nosebleeds   Video bronchoscopy with endobronchial ultrasound N/A 11/21/2013    Procedure: VIDEO BRONCHOSCOPY WITH ENDOBRONCHIAL ULTRASOUND;  Surgeon: Melrose Nakayama, MD;  Location: [facility];  Service: Thoracic;  Laterality: N/A;   Mediastinoscopy N/A 11/21/2013    Procedure: MEDIASTINOSCOPY;  Surgeon: Melrose Nakayama, MD;  Location: [facility];  Service: Thoracic;  Laterality: N/A;    FAMILY HISTORY: family history includes Cancer in his father and mother; Colon cancer in his brother; Coronary artery disease in his brother.  SOCIAL HISTORY:  reports that he has been smoking Cigarettes.  He has a 30 pack-year smoking history. He has never used smokeless tobacco. He reports that he does not drink alcohol or use illicit drugs.  REVIEW OF SYSTEMS:  Other than that discussed above is noncontributory.  PHYSICAL EXAMINATION: ECOG PERFORMANCE STATUS: 1 - Symptomatic but completely ambulatory  Blood pressure 129/77, pulse 73, temperature 98.4 F (36.9 C), temperature source Oral, resp. rate 18, weight 142 lb 12.8 oz (64.774 kg), SpO2 96.00%.  GENERAL:alert, no distress and comfortable SKIN: skin color, texture, turgor are normal, no rashes or significant lesions EYES: PERLA; Conjunctiva are pink and non-injected, sclera clear SINUSES: No redness or tenderness over maxillary or ethmoid sinuses OROPHARYNX:no exudate, no erythema on lips, buccal mucosa, or tongue. NECK: supple, thyroid normal size, non-tender, without nodularity. No masses CHEST: Increased AP diameter with scar of the mediastinum. LYMPH:  no palpable lymphadenopathy in the cervical, axillary or inguinal LUNGS: clear to  auscultation and percussion with normal breathing effort. No rales or rhonchi. HEART: Irregularly irregular with no S3.. ABDOMEN:abdomen soft, non-tender and normal bowel sounds soft with no organomegaly, ascites, or CVA tenderness. MUSCULOSKELETAL:no cyanosis of digits and no clubbing. Range of motion normal.  NEURO: alert & oriented x 3 with fluent speech, no focal motor/sensory deficits   LABORATORY DATA: Lab on 11/28/2013  Component Date Value Ref Range Status   WBC 11/28/2013 5.7  4.0 - 10.5 K/uL Final   RBC 11/28/2013 3.36* 4.22 - 5.81 MIL/uL Final   Hemoglobin 11/28/2013 9.6* 13.0 - 17.0 g/dL Final   HCT 11/28/2013 30.5* 39.0 - 52.0 % Final   MCV 11/28/2013 90.8  78.0 - 100.0 fL Final   MCH 11/28/2013 28.6  26.0 - 34.0 pg Final   MCHC 11/28/2013 31.5  30.0 - 36.0 g/dL Final   RDW 11/28/2013 17.1* 11.5 - 15.5 % Final   Platelets 11/28/2013 286  150 - 400 K/uL Final   Neutrophils Relative % 11/28/2013 54  43 - 77 % Final   Neutro Abs 11/28/2013 3.1  1.7 - 7.7 K/uL Final   Lymphocytes Relative 11/28/2013 19  12 - 46 % Final   Lymphs Abs 11/28/2013 1.1  0.7 - 4.0 K/uL Final   Monocytes Relative 11/28/2013 24* 3 - 12 % Final   Monocytes Absolute 11/28/2013 1.4* 0.1 - 1.0 K/uL Final   Eosinophils Relative 11/28/2013 3  0 - 5 % Final   Eosinophils Absolute 11/28/2013 0.2  0.0 - 0.7 K/uL Final   Basophils  Relative 11/28/2013 0  0 - 1 % Final   Basophils Absolute 11/28/2013 0.0  0.0 - 0.1 K/uL Final   Sodium 11/28/2013 138  137 - 147 mEq/L Final   Potassium 11/28/2013 3.5* 3.7 - 5.3 mEq/L Final   Chloride 11/28/2013 92* 96 - 112 mEq/L Final   CO2 11/28/2013 34* 19 - 32 mEq/L Final   Glucose, Bld 11/28/2013 73  70 - 99 mg/dL Final   BUN 11/28/2013 9  6 - 23 mg/dL Final   Creatinine, Ser 11/28/2013 1.00  0.50 - 1.35 mg/dL Final   Calcium 11/28/2013 9.5  8.4 - 10.5 mg/dL Final   Total Protein 11/28/2013 7.9  6.0 - 8.3 g/dL Final   Albumin 11/28/2013 3.5   3.5 - 5.2 g/dL Final   AST 11/28/2013 16  0 - 37 U/L Final   ALT 11/28/2013 7  0 - 53 U/L Final   Alkaline Phosphatase 11/28/2013 124* 39 - 117 U/L Final   Total Bilirubin 11/28/2013 0.4  0.3 - 1.2 mg/dL Final   GFR calc non Af Amer 11/28/2013 73* >90 mL/min Final   GFR calc Af Amer 11/28/2013 84* >90 mL/min Final   Comment: (NOTE)                          The eGFR has been calculated using the CKD EPI equation.                          This calculation has not been validated in all clinical situations.                          eGFR's persistently <90 mL/min signify possible Chronic Kidney                          Disease.   Anion gap 11/28/2013 12  5 - 15 Final  Anti-coag visit on 11/26/2013  Component Date Value Ref Range Status   INR 11/26/2013 1.6   Final  Admission on 11/21/2013, Discharged on 11/22/2013  Component Date Value Ref Range Status   Prothrombin Time 11/22/2013 15.3* 11.6 - 15.2 seconds Final   INR 11/22/2013 1.21  0.00 - 1.49 Final  Hospital Outpatient Visit on 11/19/2013  Component Date Value Ref Range Status   MRSA, PCR 11/19/2013 NEGATIVE  NEGATIVE Final   Staphylococcus aureus 11/19/2013 NEGATIVE  NEGATIVE Final   Comment:                                 The Xpert SA Assay (FDA                          approved for NASAL specimens                          in patients over 32 years of age),                          is one component of                          a comprehensive surveillance  program.  Test performance has                          been validated by [facility] for patients greater                          than or equal to 10 year old.                          It is not intended                          to diagnose infection nor to                          guide or monitor treatment.   aPTT 11/19/2013 42* 24 - 37 seconds Final   Comment:                                 IF BASELINE aPTT  IS ELEVATED,                          SUGGEST PATIENT RISK ASSESSMENT                          BE USED TO DETERMINE APPROPRIATE                          ANTICOAGULANT THERAPY.   WBC 11/19/2013 4.7  4.0 - 10.5 K/uL Final   RBC 11/19/2013 3.68* 4.22 - 5.81 MIL/uL Final   Hemoglobin 11/19/2013 10.0* 13.0 - 17.0 g/dL Final   HCT 11/19/2013 32.6* 39.0 - 52.0 % Final   MCV 11/19/2013 88.6  78.0 - 100.0 fL Final   MCH 11/19/2013 27.2  26.0 - 34.0 pg Final   MCHC 11/19/2013 30.7  30.0 - 36.0 g/dL Final   RDW 11/19/2013 17.2* 11.5 - 15.5 % Final   Platelets 11/19/2013 209  150 - 400 K/uL Final   Sodium 11/19/2013 138  137 - 147 mEq/L Final   Potassium 11/19/2013 4.1  3.7 - 5.3 mEq/L Final   Chloride 11/19/2013 97  96 - 112 mEq/L Final   CO2 11/19/2013 29  19 - 32 mEq/L Final   Glucose, Bld 11/19/2013 93  70 - 99 mg/dL Final   BUN 11/19/2013 18  6 - 23 mg/dL Final   Creatinine, Ser 11/19/2013 1.14  0.50 - 1.35 mg/dL Final   Calcium 11/19/2013 9.4  8.4 - 10.5 mg/dL Final   Total Protein 11/19/2013 7.6  6.0 - 8.3 g/dL Final   Albumin 11/19/2013 3.5  3.5 - 5.2 g/dL Final   AST 11/19/2013 18  0 - 37 U/L Final   ALT 11/19/2013 9  0 - 53 U/L Final   Alkaline Phosphatase 11/19/2013 126* 39 - 117 U/L Final   Total Bilirubin 11/19/2013 0.4  0.3 - 1.2 mg/dL Final   GFR calc non Af Amer 11/19/2013 62* >90 mL/min Final   GFR calc Af Amer 11/19/2013 72* >90 mL/min Final   Comment: (NOTE)  The eGFR has been calculated using the CKD EPI equation.                          This calculation has not been validated in all clinical situations.                          eGFR's persistently <90 mL/min signify possible Chronic Kidney                          Disease.   Anion gap 11/19/2013 12  5 - 15 Final   Prothrombin Time 11/19/2013 15.7* 11.6 - 15.2 seconds Final   INR 11/19/2013 1.25  0.00 - 1.49 Final   ABO/RH(D) 11/19/2013 O POS   Final   Antibody  Screen 11/19/2013 NEG   Final   Sample Expiration 11/19/2013 12/03/2013   Final   Color, Urine 11/19/2013 AMBER* YELLOW Final   BIOCHEMICALS MAY BE AFFECTED BY COLOR   APPearance 11/19/2013 CLEAR  CLEAR Final   Specific Gravity, Urine 11/19/2013 1.018  1.005 - 1.030 Final   pH 11/19/2013 6.0  5.0 - 8.0 Final   Glucose, UA 11/19/2013 NEGATIVE  NEGATIVE mg/dL Final   Hgb urine dipstick 11/19/2013 NEGATIVE  NEGATIVE Final   Bilirubin Urine 11/19/2013 NEGATIVE  NEGATIVE Final   Ketones, ur 11/19/2013 NEGATIVE  NEGATIVE mg/dL Final   Protein, ur 11/19/2013 NEGATIVE  NEGATIVE mg/dL Final   Urobilinogen, UA 11/19/2013 1.0  0.0 - 1.0 mg/dL Final   Nitrite 11/19/2013 NEGATIVE  NEGATIVE Final   Leukocytes, UA 11/19/2013 NEGATIVE  NEGATIVE Final   MICROSCOPIC NOT DONE ON URINES WITH NEGATIVE PROTEIN, BLOOD, LEUKOCYTES, NITRITE, OR GLUCOSE <1000 mg/dL.  Anti-coag visit on 11/13/2013  Component Date Value Ref Range Status   INR 11/13/2013 3.2   Final  Anti-coag visit on 11/10/2013  Component Date Value Ref Range Status   INR 11/10/2013 3.4   Final    PATHOLOGY:  FINAL for [place], [place] ([id number]) Patient: Theodore [name] Collected: 11/21/2013 Client: [facility] Accession: [id number] Received: 11/21/2013 Mod[place] Charon DOB: 02/13/1941 Age: 25 Gender: M Reported: 11/24/2013 [address] Patient Ph: [phone] MRN #: [id number] [place], [place] [address] Visit #: [id number] Chart #: Phone:  Fax: CC: REPORT OF SURGICAL PATHOLOGY FINAL DIAGNOSIS Diagnosis 1. Lymph node, biopsy, 4 R - ONE BENIGN LYMPH NODE (0/1). - SEE COMMENT. 2. Lymph node, biopsy, 4 L - ONE BENIGN LYMPH NODE (0/1) - SEE COMMENT. 3. Lymph node, biopsy, 4 R #2 - ONE BENIGN LYMPH NODE (0/1). - SEE COMMENT. Microscopic Comment 1. Multiple slide sections were reviewed. There are no intranodal metastatic tumor deposits nor granulomatous inflammation present. (CRR:gt,  11/24/13) Theodore [name] Pathologist, Electronic Signature (Case signed 11/24/2013) Intraoperative Diagnosis RAPID INTRAOPERATIVE CONSULT: LEVEL 4R NODE, FROZEN SECTION - BENIGN. (JDP). Specimen Gross and Clinical Information Specimen(s) Obtained: 1. Lymph node, biopsy, 4 R 2. Lymph node, biopsy, 4 L 3. Lymph node, biopsy, 4 R #2 Specimen Clinical Information 1. bilateral lung nodules (cm) Gross 1. Received fresh is a 1.2 x 1.0 x 0.3 cm aggregate of tan gray to red soft to rubbery tissue, entirely submitted in one block for frozen section. 1 of 2 FINAL for Theodore [name], Theodore [name] ([id number]) Gross(continued) 2. Received in saline labeled level 4L node is a 1.0 x 0.8 x 0.2 cm aggregate of tan gray to red soft tissue, entirely submitted  in one block for routine histology. 3. Received in saline labeled level 4R node #2, is a 1.3 x 1.1 x 0.3 cm aggregate of anthracotic soft to rubbery tissue, entirely submitted in one block. (SW:ds 11/21/13) Report signed out from the following location(s) Technical Component and Interpretation performed at [facility] [place], [place], [place] [address]. CLIA #: [id number],   FINAL for Theodore [name], Theodore [name] ([id number]) Patient: Theodore [name], Theodore [name] Collected: 11/21/2013 Client: [facility] Accession: [id number] Received: 11/21/2013 Modesto Charon DOB: Jan 22, 1941 Age: 73 Gender: M Reported: 11/24/2013 [address] Patient Ph: [phone] MRN#: [id number] [place], [place] [address] Client Acc#: Chart: Phone:  Fax: LMP: Visit#: [id number] CC: CYTOPATHOLOGY REPORT Adequacy Reason Satisfactory For Evaluation. Diagnosis FINE NEEDLE ASPIRATION, ENDOSCOPIC 4: EBUS #4 4L NODE (SPECIMEN 4 OF 5, COLLECTED ON 11/21/2013): NO MALIGNANT CELLS IDENTIFIED. Preliminary Diagnosis DX: NO MALIGNANT CELLS IDENTIFIED (JDP) Theodore [name] Pathologist, Electronic Signature (Case signed 11/24/2013) Specimen Clinical Information Bilateral  lung nodules Source Fine Needle Aspiration, Endoscopic, 4: Ebus #4 4L Node, (Specimen 4 of 5, collected on 11/21/13 ) Gross Specimen: Received is/are _ Prepared: (1) 2 slides (1 Quick stain). (2) 2 slides (1 Quick stain). Also received are 30cc's of clear, colorless cytolyt solution from needle rinses. (SA:PH:ph) # Smears: 4 # Concentration Technique Slides (i.e. ThinPrep): 1 # Cell Block: Cell block attempted, but not obtained. Additional Studies: N/A Report signed out from the following location(s) Associate Professor and Interpretation performed at [facility], [place], [place] [address]. CLIA #: [id number]    Component Value Date/Time   COLORURINE AMBER* 11/19/2013 1522   APPEARANCEUR CLEAR 11/19/2013 1522   LABSPEC 1.018 11/19/2013 1522   PHURINE 6.0 11/19/2013 1522   GLUCOSEU NEGATIVE 11/19/2013 1522   HGBUR NEGATIVE 11/19/2013 1522   HGBUR trace-intact 05/06/2007 0853   BILIRUBINUR NEGATIVE 11/19/2013 1522   KETONESUR NEGATIVE 11/19/2013 1522   PROTEINUR NEGATIVE 11/19/2013 1522   UROBILINOGEN 1.0 11/19/2013 1522   NITRITE NEGATIVE 11/19/2013 1522   LEUKOCYTESUR NEGATIVE 11/19/2013 1522    RADIOGRAPHIC STUDIES: Chest 2 View  11/19/2013   CLINICAL DATA:  COPD. Atrial fibrillation. Pulmonary hypertension. Preop respiratory exam  EXAM: CHEST  2 VIEW  COMPARISON:  07/17/2013  FINDINGS: Moderate cardiomegaly and pulmonary venous hypertension are stable. No evidence of pulmonary infiltrate or edema. Scarring in the medial left lung base is unchanged. No evidence of pleural effusion.  IMPRESSION: Stable cardiomegaly, chronic pulmonary venous hypertension, and left lower lobe scarring. No acute findings.   Electronically Signed   By: Earle Gell M.D.   On: 11/19/2013 16:41   NM PET Image Initial (PI) Skull Base To Thigh Status: Final result         PACS Images    Show images for NM PET Image Initial (PI) Skull Base To Thigh         Study Result     CLINICAL DATA: Initial treatment strategy for Lung cancer.  EXAM:  NUCLEAR MEDICINE PET SKULL BASE TO THIGH  TECHNIQUE:  8.0 mCi F-18 FDG was injected intravenously. Full-ring PET imaging  was performed from the skull base to thigh after the radiotracer. CT  data was obtained and used for attenuation correction and anatomic  localization.  FASTING BLOOD GLUCOSE: Value: 90 mg/dl  COMPARISON: None.  FINDINGS:  NECK  No hypermetabolic lymph nodes in the neck.  CHEST  Moderate to advanced changes of centrilobular and paraseptal  emphysema identified. 9 mm nodule within the  left upper lobe has an  SUV max equal to 4.2. Within the right upper lobe there is a 7 mm  nodule within SUV max equal to 1.9. Within the superior segment of  the right lower lobe there is a nodule measuring 7 mm. This has an  SUV max equal to 1.9. There is a tiny nodule in the right upper lobe  measuring 4 mm. This is far too small to characterize by PET-CT.  Enlarged right paratracheal lymph node measures 1.8 cm. The SUV max  associated with this lymph node is equal to 2.8. Left paratracheal  lymph node has an SUV max equal to 3.0. The heart size is enlarged.  No pericardial effusion. There is calcified atherosclerotic disease  involving the thoracic aorta as well as the LAD, left circumflex and  RCA coronary arteries.  ABDOMEN/PELVIS  No abnormal hypermetabolic activity within the liver, pancreas,  adrenal glands, or spleen. No hypermetabolic lymph nodes in the  abdomen or pelvis. Calcified atherosclerotic disease involves the  abdominal aorta.  SKELETON  No focal hypermetabolic activity to suggest skeletal metastasis.  IMPRESSION:  1. There are at least 3 pulmonary nodules within the right upper  lobe, right lower lobe and left upper lobe are identified and  exhibit malignant range FDG uptake and are worrisome for malignancy.  2. Enlarged right paratracheal lymph node exhibits malignant range  activity and is  worrisome for metastatic adenopathy.  3. No evidence for metastatic disease to the bones, abdomen or  pelvis.  4. Extensive atherosclerotic disease including multi vessel coronary  artery calcification.  5. Emphysema.  Electronically Signed  By: Kerby Moors M.D.  On: 10/17/2013 14:28      ASSESSMENT:  #1. Pulmonary nodules with paratracheal adenopathy, biopsy negative. Normal angiotensin-converting enzyme level #2. Chronic obstructive pulmonary disease, on treatment.. #3. Gout, on treatment.  #4. Atrial fibrillation with controlled ventricular response, on warfarin.  #5. Chronic kidney disease, stage II.  #6. Epistaxis, status post endovascular supersensitive embolectomy of external carotid arteries.  #7. Normocytic normochromic anemia    PLAN:  #1. Repeat CT chest abdomen and pelvis in November 2015 followed by office visit with CBC, chem profile, and LDH. #2. In the presence of a social worker who accompanied him today, all are in agreement with this strategy.   All questions were answered. The patient knows to call the clinic with any problems, questions or concerns. We can certainly see the patient much sooner if necessary.   I spent 25 minutes counseling the patient face to face. The total time spent in the appointment was 30 minutes.    Theodore Bradford, MD 11/28/2013 1:48 PM  DISCLAIMER:  This note was dictated with voice recognition software.  Similar sounding words can inadvertently be transcribed inaccurately and may not be corrected upon review.

## 2013-12-01 ENCOUNTER — Ambulatory Visit (INDEPENDENT_AMBULATORY_CARE_PROVIDER_SITE_OTHER): Payer: Medicare Other | Admitting: *Deleted

## 2013-12-01 DIAGNOSIS — I4729 Other ventricular tachycardia: Secondary | ICD-10-CM

## 2013-12-01 DIAGNOSIS — Z5181 Encounter for therapeutic drug level monitoring: Secondary | ICD-10-CM

## 2013-12-01 DIAGNOSIS — I472 Ventricular tachycardia: Secondary | ICD-10-CM

## 2013-12-01 DIAGNOSIS — R079 Chest pain, unspecified: Secondary | ICD-10-CM

## 2013-12-01 DIAGNOSIS — I4891 Unspecified atrial fibrillation: Secondary | ICD-10-CM

## 2013-12-01 LAB — POCT INR: INR: 2

## 2013-12-02 ENCOUNTER — Encounter: Payer: Self-pay | Admitting: Thoracic Surgery (Cardiothoracic Vascular Surgery)

## 2013-12-02 ENCOUNTER — Ambulatory Visit (INDEPENDENT_AMBULATORY_CARE_PROVIDER_SITE_OTHER): Payer: Medicare Other | Admitting: Thoracic Surgery (Cardiothoracic Vascular Surgery)

## 2013-12-02 VITALS — BP 126/68 | HR 84 | Ht 66.0 in | Wt 142.0 lb

## 2013-12-02 DIAGNOSIS — R918 Other nonspecific abnormal finding of lung field: Secondary | ICD-10-CM

## 2013-12-02 DIAGNOSIS — R590 Localized enlarged lymph nodes: Secondary | ICD-10-CM

## 2013-12-02 DIAGNOSIS — R599 Enlarged lymph nodes, unspecified: Secondary | ICD-10-CM

## 2013-12-02 NOTE — Progress Notes (Signed)
HPI:  Mr. Lippmann returns today to discuss the results of his recent biopsies.  He had bronchoscopy, endobronchial ultrasound, and mediastinoscopy on 11/21/2013. Because of his numerous comorbidities we kept him overnight, but he did fine and was discharged the following day. All of the biopsies came back benign.  He says he is doing well. He has some mild discomfort with his incision but is not taking any pain medication.   Current Outpatient Prescriptions  Medication Sig Dispense Refill  . albuterol (PROAIR HFA) 108 (90 BASE) MCG/ACT inhaler Inhale 2 puffs into the lungs every 6 (six) hours as needed for wheezing or shortness of breath.  6.7 g  4  . albuterol (PROVENTIL) (2.5 MG/3ML) 0.083% nebulizer solution Take 2.5 mg by nebulization every 8 (eight) hours as needed for wheezing or shortness of breath.      . allopurinol (ZYLOPRIM) 300 MG tablet Take 1 tablet (300 mg total) by mouth daily.  30 tablet  6  . brimonidine-timolol (COMBIGAN) 0.2-0.5 % ophthalmic solution Place 1 drop into both eyes every 12 (twelve) hours.      . budesonide-formoterol (SYMBICORT) 160-4.5 MCG/ACT inhaler Inhale 2 puffs into the lungs 2 (two) times daily.  1 Inhaler  4  . diltiazem (CARDIZEM CD) 180 MG 24 hr capsule Take 1 capsule (180 mg total) by mouth daily.  30 capsule  6  . enoxaparin (LOVENOX) 100 MG/ML injection Inject 100 mg into the skin. Pt unsure of his dosage, only has 1 more injection.      . ferrous sulfate 325 (65 FE) MG tablet Take 325 mg by mouth 2 (two) times daily with a meal.       . hydrALAZINE (APRESOLINE) 25 MG tablet Take 25 mg by mouth 3 (three) times daily.      . metoprolol (LOPRESSOR) 50 MG tablet Take 1 tablet (50 mg total) by mouth 2 (two) times daily.  60 tablet  0  . mirtazapine (REMERON) 15 MG tablet Take 15 mg by mouth at bedtime.      . montelukast (SINGULAIR) 10 MG tablet Take 10 mg by mouth at bedtime.      . potassium chloride (K-DUR) 10 MEQ tablet Take 10 mEq by mouth  daily.       Marland Kitchen PROAIR HFA 108 (90 BASE) MCG/ACT inhaler INHALE 2 PUFFS BY MOUTH EVERY 6 HOURS AS NEEDED FOR WHEEZING OR SHORTNESS OF BREATH  8.5 g  3  . SYMBICORT 160-4.5 MCG/ACT inhaler INHALE 2 PUFFS BY MOUTH TWICE DAILY  10.2 g  3  . tiotropium (SPIRIVA) 18 MCG inhalation capsule Place 18 mcg into inhaler and inhale daily.      Marland Kitchen torsemide (DEMADEX) 20 MG tablet Take 2 tablets (40 mg total) by mouth daily.  60 tablet  3  . travoprost, benzalkonium, (TRAVATAN) 0.004 % ophthalmic solution Place 1 drop into both eyes at bedtime.      Marland Kitchen warfarin (COUMADIN) 4 MG tablet Take 4-6 mg by mouth daily. Take 4 mg daily except 6 mg Monday and wednesdays  or as directed      . ALPRAZolam (XANAX) 0.25 MG tablet Take 0.25 mg by mouth daily as needed for anxiety.      Marland Kitchen oxyCODONE (OXY IR/ROXICODONE) 5 MG immediate release tablet Take 5 mg by mouth every 6 (six) hours as needed for moderate pain or severe pain.      . traMADol (ULTRAM) 50 MG tablet TAKE 1 TABLET BY MOUTH EVERY DAY AS NEEDED  30 tablet  3   No current facility-administered medications for this visit.    Physical Exam BP 126/68  Pulse 84  Ht 5\' 6"  (1.676 m)  Wt 142 lb (64.411 kg)  BMI 22.93 kg/m2  SpO30 63% 73 year old male in no acute distress Neck incision healing well  Diagnostic Tests: FINAL DIAGNOSIS Diagnosis 1. Lymph node, biopsy, 4 R - ONE BENIGN LYMPH NODE (0/1). - SEE COMMENT. 2. Lymph node, biopsy, 4 L - ONE BENIGN LYMPH NODE (0/1) - SEE COMMENT. 3. Lymph node, biopsy, 4 R #2 - ONE BENIGN LYMPH NODE (0/1). - SEE COMMENT. Microscopic Comment 1. Multiple slide sections were reviewed. There are no intranodal metastatic tumor deposits nor granulomatous inflammation present. (CRR:gt, 11/24/13) Mali RUND DO Pathologist, Electronic Signature  Impression: 73 year old gentleman who is now 10 days out from a bronchoscopy, endobronchial ultrasound, and mediastinoscopy. It was nondiagnostic. At least there is no evidence of  tumor in the lymph nodes. He does have still multiple lung nodules that were hypermetabolic on PET. These were all relatively small. He has an appointment to see Dr. Barnet Glasgow back in November with a repeat CT. Hopefully the nodules will be stable or decreased at that time. If the nodules growth to consider alternate means of biopsing them such as a navigational bronchoscopy or CT-guided biopsy.  Plan: He will have a repeat CT with Dr. Barnet Glasgow  I will be happy to see him again if I can assist

## 2013-12-08 NOTE — Discharge Summary (Signed)
Physician Discharge Summary  Patient ID: Nabil Bubolz Kampe MRN: [id number] DOB/AGE: [birth year] 74 y.o.  Admit date: 11/21/2013 Discharge date: 12/08/2013  Admission Diagnoses: Left upper lobe lung nodule  Discharge Diagnoses:  Active Problems:   Lung nodule  Patient Active Problem List   Diagnosis Date Noted  . Lung nodule 11/21/2013  . Abnormal CAT scan 10/10/2013  . Epistaxis, recurrent 07/08/2013  . ARF (acute renal failure) 07/08/2013  . Encounter for therapeutic drug monitoring 07/03/2013  . NSVT (nonsustained ventricular tachycardia) 07/01/2013  . COPD with acute exacerbation 06/24/2013  . Leukocytosis, unspecified 06/22/2013  . Normocytic anemia 06/18/2013  . Acute on chronic diastolic heart failure 72/53/6644  . Pulmonary arterial hypertension 06/17/2013  . Acute respiratory failure with hypoxia 06/17/2013  . CHF (congestive heart failure) 06/16/2013  . Routine general medical examination at a health care facility 05/25/2013  . Hyperuricemia 05/23/2013  . Depression with anxiety 05/21/2013  . Insomnia secondary to anxiety 04/14/2013  . Glaucoma 04/14/2013  . COPD (chronic obstructive pulmonary disease) 04/02/2013  . Epistaxis 04/01/2013  . Acute blood loss anemia 04/01/2013  . 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 Tobacco abuse disorder 12/24/2010  . Encounter for long-term (current) use of other medications 05/15/2010  . HYPERTENSION 02/13/2006  . Atrial fibrillation 02/13/2006   Problem List Items Addressed This Visit   None    Visit Diagnoses   Multiple lung nodules    -  Primary    Relevant Medications       cefUROXime (ZINACEF) 1.5 g in dextrose 5 % 50 mL IVPB (Completed)      History of present illness:  Mr. Krieger is a 73 year old gentleman sent for consultation regarding lung nodules and mediastinal adenopathy.  He is a 73 year old man with a complex medical history including chronic diastolic heart  failure, chronic atrial fibrillation, heavy tobacco abuse (2 packs per day x55 years), COPD, hypertension, and pulmonary hypertension. He recently was being evaluated for recurrent epistaxis. He had 2 severe episodes requiring hospitalization and transfusion. He ultimately underwent embolization and has not had any further episodes since that procedure. As part of that procedure he had a CT angiogram of his neck and on that was noted to have a spiculated mass in his left upper lobe. He subsequently had a PET/CT. It showed at least 3 pulmonary nodules or hypermetabolic. In addition there were enlarged and hypermetabolic paratracheal lymph nodes.  He had smoked 2 packs per day until recently. He says he is currently down to 2 cigarettes a day. He does get short of breath with exertion. He is on home oxygen but mainly uses at night when he is asleep. He is on Coumadin for his chronic atrial fibrillation. He has been working with his [place] and his heart failure is currently well compensated. He was admitted for video bronchoscopy with endobronchial ultrasound and possible mediastinoscopy. Past Medical History   Diagnosis  Date   .  Anemia      Hemoglobin 11.6 in 04/2008 -> resolved   .  Tobacco abuse    .  Glaucoma    .  Allergic rhinitis    .  Atrial fibrillation      Coumadin   .  Diastolic heart failure      LVEF 03-47%, rate 2 diastolic dysfunction 06/2593   .  Pulmonary hypertension      70 mmHg 03/2012   .  Shingles    .  Essential  hypertension, benign    .  COPD (chronic obstructive pulmonary disease)    .  Asthma     Past Surgical History   Procedure  Laterality  Date   .  Bilateral cataract surgery     .  Herniorrhapy     .  Tendon repair       Right hand surgical procedure for a tendon repair   .  Colonoscopy  N/A  11/29/2012     Procedure: COLONOSCOPY; Surgeon: Danie Binder, MD; Location: [facility]; Service: Endoscopy; Laterality: N/A; 1:00   .   Esophagogastroduodenoscopy  N/A  11/29/2012     Procedure: ESOPHAGOGASTRODUODENOSCOPY (EGD); Surgeon: Danie Binder, MD; Location: [facility]; Service: Endoscopy; Laterality: N/A;   .  Eye surgery     .  Hernia repair     .  Radiology with anesthesia  N/A  07/10/2013     Procedure: EMBOLIZATION-RADIOLOGY WITH ANESTHESIA; Surgeon: Rob Hickman, MD; Location: [facility]; Service: Radiology; Laterality: N/A;    Family History   Problem  Relation  Age of Onset   .  Cancer  Mother    .  Cancer  Father    .  Coronary artery disease  Brother    .  Colon cancer  Brother     Social History  History   Substance Use Topics   .  Smoking status:  Current Some Day Smoker -- 2.00 packs/day for 55 years     Types:  Cigarettes   .  Smokeless tobacco:  Never Used      Comment: says he is down to 2-3 cigarettes/ day   .  Alcohol Use:  No    Current Outpatient Prescriptions   Medication  Sig  Dispense  Refill   .  albuterol (PROAIR HFA) 108 (90 BASE) MCG/ACT inhaler  Inhale 2 puffs into the lungs every 6 (six) hours as needed for wheezing or shortness of breath.  6.7 g  4   .  albuterol (PROVENTIL) (2.5 MG/3ML) 0.083% nebulizer solution  INHALE CONTENTS OF 1 VIAL VIA NEBULIZER EVERY 6 HOURS AS NEEDED  375 mL  1   .  allopurinol (ZYLOPRIM) 300 MG tablet  Take 1 tablet (300 mg total) by mouth daily.  30 tablet  6   .  ALPRAZolam (XANAX) 0.25 MG tablet  One tablet once daily as needed, for anxiety  30 tablet  4   .  brimonidine-timolol (COMBIGAN) 0.2-0.5 % ophthalmic solution  Place 1 drop into both eyes every 12 (twelve) hours.  5 mL  0   .  budesonide-formoterol (SYMBICORT) 160-4.5 MCG/ACT inhaler  Inhale 2 puffs into the lungs 2 (two) times daily.  1 Inhaler  4   .  diltiazem (CARDIZEM CD) 180 MG 24 hr capsule  Take 1 capsule (180 mg total) by mouth daily.  30 capsule  6   .  ferrous sulfate 325 (65 FE) MG tablet  Take 325 mg by mouth 2 (two) times daily.     .  metoprolol (LOPRESSOR) 50 MG tablet   Take 1 tablet (50 mg total) by mouth 2 (two) times daily.  60 tablet  0   .  montelukast (SINGULAIR) 10 MG tablet  Take 10 mg by mouth at bedtime.     .  potassium chloride (K-DUR) 10 MEQ tablet  Take 10 mEq by mouth 2 (two) times daily.     Marland Kitchen  SPIRIVA HANDIHALER 18 MCG inhalation capsule  INHALE THE  CONTENTS OF 1 CAPSULE USING HANDIHALER EVERY DAY  30 capsule  6   .  temazepam (RESTORIL) 15 MG capsule  TAKE 1 CAPSULE BY MOUTH EVERY NIGHT AT BEDTIME AS NEEDED FOR SLEEP  30 capsule  2   .  torsemide (DEMADEX) 20 MG tablet  Take 2 tablets (40 mg total) by mouth daily.  60 tablet  3   .  traMADol (ULTRAM) 50 MG tablet  Take 1 tablet (50 mg total) by mouth daily as needed.  30 tablet  0   .  warfarin (COUMADIN) 4 MG tablet  Take 1 tablet daily except 1 1/2 tablets on Mondays -Wednesday-friday or as directed      No current facility-administered medications for this visit.    Allergies   Allergen  Reactions   .  Aspirin  Other (See Comments)     On coumadin     Discharged Condition: good  Hospital Course: The patient was admitted electively and on 11/21/2013 taken to the operating room at which time he underwent the following procedure: DATE OF PROCEDURE: 11/21/2013  DATE OF DISCHARGE:  OPERATIVE REPORT  PREOPERATIVE DIAGNOSES: Bilateral lung nodules and mediastinal  adenopathy.  POSTOPERATIVE DIAGNOSES: Bilateral lung nodules and mediastinal  adenopathy.  PROCEDURES: Bronchoscopy, endobronchial ultrasound, mediastinoscopy.  SURGEON: Revonda Standard. Roxan Hockey, M.D.  ASSISTANT: None.  ANESTHESIA: General.  FINDINGS: Enlarged mediastinal lymph nodes. Needle aspirations and  frozen section. The 4R node revealed benign lymph nodes with no tumor  seen. The patient was taken from  the operating room to the postanesthetic care unit in good condition.  Post operative hospital course: Do to his multiple medical comorbidities it was felt that he should be observed overnight. He remained  hemodynamically stable in chronic rate controlled atrial fibrillation. Oxygenation remained stable without requiring supplemental O2. . he had no postoperative complications and was deemed quite stable for discharge in the morning following the procedure.  Pathology report: Lymph node, biopsy, 4 R - ONE BENIGN LYMPH NODE (0/1). - SEE COMMENT. 2. Lymph node, biopsy, 4 L - ONE BENIGN LYMPH NODE (0/1) - SEE COMMENT. 3. Lymph node, biopsy, 4 R #2 - ONE BENIGN LYMPH NODE (0/1). - SEE COMMENT. Microscopic Comment 1. Multiple slide sections were reviewed. There are no intranodal metastatic tumor deposits nor granulomatous inflammation present. (CRR:gt, 11/24/13) Mali RUND DO Pathologist, Electronic Signature (Case signed 11/24/2013) Intraoperative Diagnosis RAPID INTRAOPERATIVE CONSULT: LEVEL 4R NODE, FROZEN SECTION - BENIGN. (JDP). Specimen Gross and Clinical Information Specimen(s) Obtained: 1. Lymph node, biopsy, 4 R 2. Lymph node, biopsy, 4 L 3. Lymph node, biopsy, 4 R #2 Specimen Clinical Information 1. bilateral lung nodules (cm) Gross 1. Received fresh is a 1.2 x 1.0 x 0.3 cm aggregate of tan gray to red soft to rubbery tissue, entirely submitted in one block for frozen section. 1 of 2 FINAL for JAKIE, DEBOW ([id number]) Gross(continued) 2. Received in saline labeled level 4L node is a 1.0 x 0.8 x 0.2 cm aggregate of tan gray to red soft tissue, entirely submitted in one block for routine histology. 3. Received in saline labeled level 4R node #2, is a 1.3 x 1.1 x 0.3 cm aggregate of anthracotic soft to rubbery tissue, entirely submitted in one block. (SW:ds 11/21/13) Report signed out from the following location(s) Technical Component and Interpretation performed at [facility], [place], [place] [address]. CLIA #: [id number], 2 of 2   Consults: None   Discharge Exam: Blood pressure 106/55, pulse 76,  temperature 99.1 F (37.3 C),  temperature source Oral, resp. rate 20, weight 142 lb (64.411 kg), SpO2 100.00%.  General appearance: alert, cooperative and no distress  Heart: irregularly irregular rhythm  Lungs: clear to auscultation bilaterally  Abdomen: benign  Extremities: no edema  Wound: incis healing well      Disposition: 01-Home or Self Care     Medication List    STOP taking these medications       enoxaparin 100 MG/ML injection  Commonly known as:  LOVENOX      TAKE these medications       albuterol (2.5 MG/3ML) 0.083% nebulizer solution  Commonly known as:  PROVENTIL  Take 2.5 mg by nebulization every 8 (eight) hours as needed for wheezing or shortness of breath.     albuterol 108 (90 BASE) MCG/ACT inhaler  Commonly known as:  PROAIR HFA  Inhale 2 puffs into the lungs every 6 (six) hours as needed for wheezing or shortness of breath.     PROAIR HFA 108 (90 BASE) MCG/ACT inhaler  Generic drug:  albuterol  INHALE 2 PUFFS BY MOUTH EVERY 6 HOURS AS NEEDED FOR WHEEZING OR SHORTNESS OF BREATH     allopurinol 300 MG tablet  Commonly known as:  ZYLOPRIM  Take 1 tablet (300 mg total) by mouth daily.     ALPRAZolam 0.25 MG tablet  Commonly known as:  XANAX  Take 0.25 mg by mouth daily as needed for anxiety.     brimonidine-timolol 0.2-0.5 % ophthalmic solution  Commonly known as:  COMBIGAN  Place 1 drop into both eyes every 12 (twelve) hours.     budesonide-formoterol 160-4.5 MCG/ACT inhaler  Commonly known as:  SYMBICORT  Inhale 2 puffs into the lungs 2 (two) times daily.     SYMBICORT 160-4.5 MCG/ACT inhaler  Generic drug:  budesonide-formoterol  INHALE 2 PUFFS BY MOUTH TWICE DAILY     diltiazem 180 MG 24 hr capsule  Commonly known as:  CARDIZEM CD  Take 1 capsule (180 mg total) by mouth daily.     ferrous sulfate 325 (65 FE) MG tablet  Take 325 mg by mouth 2 (two) times daily with a meal.     hydrALAZINE 25 MG tablet  Commonly known as:  APRESOLINE  Take 25 mg by mouth 3  (three) times daily.     metoprolol 50 MG tablet  Commonly known as:  LOPRESSOR  Take 1 tablet (50 mg total) by mouth 2 (two) times daily.     mirtazapine 15 MG tablet  Commonly known as:  REMERON  Take 15 mg by mouth at bedtime.     montelukast 10 MG tablet  Commonly known as:  SINGULAIR  Take 10 mg by mouth at bedtime.     oxyCODONE 5 MG immediate release tablet  Commonly known as:  Oxy IR/ROXICODONE  Take 5 mg by mouth every 6 (six) hours as needed for moderate pain or severe pain.     potassium chloride 10 MEQ tablet  Commonly known as:  K-DUR  Take 10 mEq by mouth daily.     tiotropium 18 MCG inhalation capsule  Commonly known as:  SPIRIVA  Place 18 mcg into inhaler and inhale daily.     torsemide 20 MG tablet  Commonly known as:  DEMADEX  Take 2 tablets (40 mg total) by mouth daily.     traMADol 50 MG tablet  Commonly known as:  ULTRAM  TAKE 1 TABLET BY MOUTH EVERY DAY AS NEEDED  travoprost (benzalkonium) 0.004 % ophthalmic solution  Commonly known as:  TRAVATAN  Place 1 drop into both eyes at bedtime.     warfarin 4 MG tablet  Commonly known as:  COUMADIN  Take 4-6 mg by mouth daily. Take 4 mg daily except 6 mg Monday and wednesdays  or as directed           Follow-up Information   Follow up with Melrose Nakayama, MD. (12/02/2013 at 3:00 pm to see surgeon)    Specialty:  Cardiothoracic Surgery   Contact information:   [address] [place] [address] [phone]       Signed: John Giovanni 12/08/2013, 9:44 AM

## 2013-12-15 ENCOUNTER — Ambulatory Visit (INDEPENDENT_AMBULATORY_CARE_PROVIDER_SITE_OTHER): Payer: Medicare Other | Admitting: Pharmacist

## 2013-12-15 DIAGNOSIS — I4891 Unspecified atrial fibrillation: Secondary | ICD-10-CM

## 2013-12-15 DIAGNOSIS — Z5181 Encounter for therapeutic drug level monitoring: Secondary | ICD-10-CM

## 2013-12-15 LAB — POCT INR: INR: 2.2

## 2013-12-30 ENCOUNTER — Ambulatory Visit (INDEPENDENT_AMBULATORY_CARE_PROVIDER_SITE_OTHER): Payer: Medicare Other | Admitting: Family Medicine

## 2013-12-30 ENCOUNTER — Encounter: Payer: Self-pay | Admitting: Family Medicine

## 2013-12-30 ENCOUNTER — Encounter (INDEPENDENT_AMBULATORY_CARE_PROVIDER_SITE_OTHER): Payer: Self-pay

## 2013-12-30 VITALS — BP 130/64 | HR 80 | Resp 16 | Ht 66.0 in | Wt 141.1 lb

## 2013-12-30 DIAGNOSIS — F418 Other specified anxiety disorders: Secondary | ICD-10-CM

## 2013-12-30 DIAGNOSIS — Z720 Tobacco use: Secondary | ICD-10-CM

## 2013-12-30 DIAGNOSIS — I5033 Acute on chronic diastolic (congestive) heart failure: Secondary | ICD-10-CM

## 2013-12-30 DIAGNOSIS — E790 Hyperuricemia without signs of inflammatory arthritis and tophaceous disease: Secondary | ICD-10-CM

## 2013-12-30 DIAGNOSIS — J42 Unspecified chronic bronchitis: Secondary | ICD-10-CM

## 2013-12-30 DIAGNOSIS — H409 Unspecified glaucoma: Secondary | ICD-10-CM

## 2013-12-30 DIAGNOSIS — F17210 Nicotine dependence, cigarettes, uncomplicated: Secondary | ICD-10-CM

## 2013-12-30 DIAGNOSIS — I10 Essential (primary) hypertension: Secondary | ICD-10-CM

## 2013-12-30 MED ORDER — MIRTAZAPINE 15 MG PO TABS: 15.0000 mg | ORAL_TABLET | Freq: Every day | ORAL | Status: AC

## 2013-12-30 NOTE — Assessment & Plan Note (Signed)
unchnaged still at 3 per day and trying to quit Patient counseled for approximately 5 minutes regarding the health risks of ongoing nicotine use, specifically all types of cancer, heart disease, stroke and respiratory failure. The options available for help with cessation ,the behavioral changes to assist the process, and the option to either gradully reduce usage  Or abruptly stop.is also discussed. Pt is also encouraged to set specific goals in number of cigarettes used daily, as well as to set a quit date.

## 2013-12-30 NOTE — Assessment & Plan Note (Addendum)
Reports reduced appetite , will resume remeron and d/c restoril, pt also encouraged to get out more often and become involved in activities outside of the home, he agrees

## 2013-12-30 NOTE — Assessment & Plan Note (Signed)
F/u appt in December with Dr Iona Hansen

## 2013-12-30 NOTE — Patient Instructions (Addendum)
F/u in 3 month, call if you need me before   STOP restoril, new for sleep and appetite is remron, start tonight.  Take "nerve pill" xanax at bedtime  CBC, chem 7 and uric acid non fast in 3 month

## 2013-12-30 NOTE — Assessment & Plan Note (Signed)
Controlled, no change in medication DASH diet and commitment to daily physical activity for a minimum of 30 minutes discussed and encouraged, as a part of hypertension management. The importance of attaining a healthy weight is also discussed.  

## 2013-12-30 NOTE — Assessment & Plan Note (Signed)
Not decompensatred, but re educated re the need to measure and limit fluid and salt intake

## 2013-12-30 NOTE — Progress Notes (Signed)
Subjective:    Patient ID: Theodore [name], male    DOB: 06-10-1940, 73 y.o.   MRN: [id number]  HPI The PT is here for follow up and re-evaluation of chronic medical conditions, medication management and review of any available recent lab and radiology data.  Preventive health is updated, specifically  Cancer screening and Immunization.   He had lung biopsy which was thankfully negative for cancer, he has rept chest scan and follow up with oncology next month The PT denies any adverse reactions to current medications since the last visit.  There are no new concerns.  Requests sleep medication which will help with his appetite, as eating by his report is erratic Sleeps with oxygen in his mouth, he is afraid of recurrent epistaxis Has had flu vaccine Reports smoking, but not fully, on average 3 ciggs per day, does stae he drinks  More sodas than water, but understands this is not in his best interest      Review of Systems See HPI Denies recent fever or chills. Denies sinus pressure, c/o  nasal congestion, denies  ear pain or sore throat. C/o chronic chest congestion, productive cough  With clear sputum and wheezing. Denies chest pains, palpitations and leg swelling Denies abdominal pain, nausea, vomiting,diarrhea or constipation.   Denies dysuria, frequency, hesitancy or incontinence. C/o intermittent right lower extremity pain denies , swelling and limitation in mobility. Denies headaches, seizures, numbness, or tingling. Denies uncontrolled  Depression, does have some  anxiety and r insomnia. Denies skin break down or rash.        Objective:   Physical Exam  BP 130/64  Pulse 80  Resp 16  Ht 5\' 6"  (1.676 m)  Wt 141 lb 1.9 oz (64.012 kg)  BMI 22.79 kg/m2 Patient alert and oriented and in no cardiopulmonary distress.  HEENT: No facial asymmetry, EOMI,   oropharynx pink and moist.  Neck supple no JVD, no mass.  Chest: Clear to auscultation bilaterally.Decreased though  adequate air entry throughout, no wheezes or crackles  CVS: S1, S2 no murmurs, no S3.Regular rate.  ABD: Soft non tender.   Ext: No edema  MS: Adequate ROM spine, shoulders, hips and knees.  Skin: Intact, no ulcerations or rash noted.  Psych: Good eye contact, normal affect. Memory intact not anxious or depressed appearing.  CNS: CN 2-12 intact, power,  normal throughout.no focal deficits noted.       Assessment & Plan:  Essential hypertension Controlled, no change in medication DASH diet and commitment to daily physical activity for a minimum of 30 minutes discussed and encouraged, as a part of hypertension management. The importance of attaining a healthy weight is also discussed.   Acute on chronic diastolic heart failure Not decompensatred, but re educated re the need to measure and limit fluid and salt intake   COPD (chronic obstructive pulmonary disease) Currently stable, encourage regular use of inhalers, which he does, and also smoking cessation  Tobacco abuse disorder unchnaged still at 3 per day and trying to quit Patient counseled for approximately 5 minutes regarding the health risks of ongoing nicotine use, specifically all types of cancer, heart disease, stroke and respiratory failure. The options available for help with cessation ,the behavioral changes to assist the process, and the option to either gradully reduce usage  Or abruptly stop.is also discussed. Pt is also encouraged to set specific goals in number of cigarettes used daily, as well as to set a quit date.   Glaucoma F/u appt in  December with Dr Iona Hansen  Depression with anxiety Reports reduced appetite , will resume remeron and d/c restoril, pt also encouraged to get out more often and become involved in activities outside of the home, he agrees

## 2013-12-30 NOTE — Assessment & Plan Note (Signed)
Currently stable, encourage regular use of inhalers, which he does, and also smoking cessation

## 2014-01-03 DIAGNOSIS — Z23 Encounter for immunization: Secondary | ICD-10-CM | POA: Insufficient documentation

## 2014-01-03 NOTE — Assessment & Plan Note (Signed)
Controlled, no change in medication  

## 2014-01-03 NOTE — Assessment & Plan Note (Signed)
Needs supplemental oxygen 24 hrs per day , but continues to resist using this

## 2014-01-03 NOTE — Assessment & Plan Note (Signed)
No symptoms or signs of decompensation at this time Pt to continue salt and water restriction

## 2014-01-03 NOTE — Assessment & Plan Note (Signed)
Vaccine administered at visit.  

## 2014-01-03 NOTE — Assessment & Plan Note (Signed)
Daily allopurinol keeps this controlled , hence reducing gout flares, continue same

## 2014-01-03 NOTE — Assessment & Plan Note (Signed)
Still at 3 per day, unwilling to set a quit date but working on thjis Patient counseled for approximately 5 minutes regarding the health risks of ongoing nicotine use, specifically all types of cancer, heart disease, stroke and respiratory failure. The options available for help with cessation ,the behavioral changes to assist the process, and the option to either gradully reduce usage  Or abruptly stop.is also discussed. Pt is also encouraged to set specific goals in number of cigarettes used daily, as well as to set a quit date.

## 2014-01-03 NOTE — Assessment & Plan Note (Signed)
Good response to remron continue same, improved sleep  And appetite

## 2014-01-03 NOTE — Assessment & Plan Note (Signed)
Importance of regular usre of eye drops and follow up is streessed

## 2014-01-05 NOTE — Addendum Note (Signed)
Addended by: Eual Fines on: 01/05/2014 09:20 AM   Modules accepted: Orders

## 2014-01-12 ENCOUNTER — Encounter: Payer: Self-pay | Admitting: Cardiovascular Disease

## 2014-01-13 ENCOUNTER — Other Ambulatory Visit: Payer: Self-pay | Admitting: Family Medicine

## 2014-01-21 ENCOUNTER — Ambulatory Visit (HOSPITAL_COMMUNITY)
Admission: RE | Admit: 2014-01-21 | Discharge: 2014-01-21 | Disposition: A | Discharge status: Home / Self Care | Payer: Medicare Other | Source: Ambulatory Visit | Attending: Hematology and Oncology | Admitting: Hematology and Oncology

## 2014-01-21 DIAGNOSIS — R918 Other nonspecific abnormal finding of lung field: Secondary | ICD-10-CM | POA: Diagnosis present

## 2014-01-21 DIAGNOSIS — R599 Enlarged lymph nodes, unspecified: Secondary | ICD-10-CM | POA: Diagnosis not present

## 2014-01-21 DIAGNOSIS — R9389 Abnormal findings on diagnostic imaging of other specified body structures: Secondary | ICD-10-CM

## 2014-01-21 DIAGNOSIS — J439 Emphysema, unspecified: Secondary | ICD-10-CM | POA: Insufficient documentation

## 2014-01-21 DIAGNOSIS — J479 Bronchiectasis, uncomplicated: Secondary | ICD-10-CM | POA: Diagnosis not present

## 2014-01-21 LAB — POCT I-STAT CREATININE: CREATININE: 1.2 mg/dL (ref 0.50–1.35)

## 2014-01-21 MED ORDER — IOHEXOL 300 MG/ML  SOLN
80.0000 mL | Freq: Once | INTRAMUSCULAR | Status: AC | PRN
  Administered 2014-01-21: 80 mL via INTRAVENOUS

## 2014-01-22 ENCOUNTER — Other Ambulatory Visit (HOSPITAL_COMMUNITY): Payer: Self-pay | Admitting: *Deleted

## 2014-01-22 DIAGNOSIS — I4891 Unspecified atrial fibrillation: Secondary | ICD-10-CM

## 2014-01-23 ENCOUNTER — Encounter (HOSPITAL_BASED_OUTPATIENT_CLINIC_OR_DEPARTMENT_OTHER): Payer: Medicare Other

## 2014-01-23 ENCOUNTER — Other Ambulatory Visit: Payer: Self-pay | Admitting: Family Medicine

## 2014-01-23 ENCOUNTER — Encounter (HOSPITAL_COMMUNITY): Payer: Medicare Other | Attending: Hematology and Oncology

## 2014-01-23 ENCOUNTER — Telehealth: Payer: Self-pay | Admitting: Family Medicine

## 2014-01-23 ENCOUNTER — Encounter (HOSPITAL_COMMUNITY): Payer: Self-pay

## 2014-01-23 VITALS — BP 134/91 | HR 120 | Temp 99.0°F | Resp 20 | Wt 142.6 lb

## 2014-01-23 DIAGNOSIS — R938 Abnormal findings on diagnostic imaging of other specified body structures: Secondary | ICD-10-CM | POA: Insufficient documentation

## 2014-01-23 DIAGNOSIS — R918 Other nonspecific abnormal finding of lung field: Secondary | ICD-10-CM

## 2014-01-23 DIAGNOSIS — R040 Epistaxis: Secondary | ICD-10-CM

## 2014-01-23 DIAGNOSIS — R942 Abnormal results of pulmonary function studies: Secondary | ICD-10-CM

## 2014-01-23 DIAGNOSIS — I4891 Unspecified atrial fibrillation: Secondary | ICD-10-CM

## 2014-01-23 DIAGNOSIS — D649 Anemia, unspecified: Secondary | ICD-10-CM

## 2014-01-23 DIAGNOSIS — N183 Chronic kidney disease, stage 3 (moderate): Secondary | ICD-10-CM

## 2014-01-23 DIAGNOSIS — R9389 Abnormal findings on diagnostic imaging of other specified body structures: Secondary | ICD-10-CM

## 2014-01-23 DIAGNOSIS — R591 Generalized enlarged lymph nodes: Secondary | ICD-10-CM

## 2014-01-23 LAB — CBC WITH DIFFERENTIAL/PLATELET
Basophils Absolute: 0 10*3/uL (ref 0.0–0.1)
Basophils Relative: 0 % (ref 0–1)
EOS PCT: 1 % (ref 0–5)
Eosinophils Absolute: 0.1 10*3/uL (ref 0.0–0.7)
HEMATOCRIT: 34.5 % — AB (ref 39.0–52.0)
HEMOGLOBIN: 10.8 g/dL — AB (ref 13.0–17.0)
LYMPHS PCT: 6 % — AB (ref 12–46)
Lymphs Abs: 0.6 10*3/uL — ABNORMAL LOW (ref 0.7–4.0)
MCH: 28.8 pg (ref 26.0–34.0)
MCHC: 31.3 g/dL (ref 30.0–36.0)
MCV: 92 fL (ref 78.0–100.0)
MONO ABS: 1.5 10*3/uL — AB (ref 0.1–1.0)
Monocytes Relative: 15 % — ABNORMAL HIGH (ref 3–12)
NEUTROS ABS: 8 10*3/uL — AB (ref 1.7–7.7)
Neutrophils Relative %: 78 % — ABNORMAL HIGH (ref 43–77)
Platelets: 276 10*3/uL (ref 150–400)
RBC: 3.75 MIL/uL — ABNORMAL LOW (ref 4.22–5.81)
RDW: 15.9 % — ABNORMAL HIGH (ref 11.5–15.5)
WBC: 10.1 10*3/uL (ref 4.0–10.5)

## 2014-01-23 LAB — COMPREHENSIVE METABOLIC PANEL
ALT: 8 U/L (ref 0–53)
ANION GAP: 12 (ref 5–15)
AST: 20 U/L (ref 0–37)
Albumin: 3.7 g/dL (ref 3.5–5.2)
Alkaline Phosphatase: 156 U/L — ABNORMAL HIGH (ref 39–117)
BUN: 16 mg/dL (ref 6–23)
CALCIUM: 9.7 mg/dL (ref 8.4–10.5)
CHLORIDE: 95 meq/L — AB (ref 96–112)
CO2: 32 meq/L (ref 19–32)
CREATININE: 1.22 mg/dL (ref 0.50–1.35)
GFR calc Af Amer: 66 mL/min — ABNORMAL LOW (ref >90)
GFR, EST NON AFRICAN AMERICAN: 57 mL/min — AB (ref >90)
Glucose, Bld: 102 mg/dL — ABNORMAL HIGH (ref 70–99)
Potassium: 4.4 mEq/L (ref 3.7–5.3)
Sodium: 139 mEq/L (ref 137–147)
Total Bilirubin: 0.8 mg/dL (ref 0.3–1.2)
Total Protein: 8.2 g/dL (ref 6.0–8.3)

## 2014-01-23 LAB — PROTIME-INR
INR: 2.03 — ABNORMAL HIGH (ref 0.00–1.49)
Prothrombin Time: 23.1 seconds — ABNORMAL HIGH (ref 11.6–15.2)

## 2014-01-23 LAB — LACTATE DEHYDROGENASE: LDH: 237 U/L (ref 94–250)

## 2014-01-23 NOTE — Telephone Encounter (Signed)
Pls let pt know that due to change in formulary coverage, I am prescribing new med for sleep starting 03/13/2014, trazodone is entered historicaly and paper to be faxed is in your area

## 2014-01-23 NOTE — Progress Notes (Signed)
Theodore [name]  OFFICE PROGRESS NOTE  Tula Nakayama, MD [address] [address]  DIAGNOSIS: Abnormal CAT scan - Plan: Angiotensin converting enzyme, Angiotensin converting enzyme  Pulmonary nodules/lesions, multiple - Plan: CBC with Differential, Comprehensive metabolic panel  Chief Complaint  Patient presents with   Follow-up   we'll very nodules    CURRENT THERAPY: status post bronchoscopy and bronchoscopic ultrasound biopsy of paratracheal lymphadenopathy with no evidence of malignancy. CT scan follow-up performed on 01/21/2014  INTERVAL HISTORY: Theodore [name] 73 y.o. male returns for follow-up of pulmonary nodules and mediastinal adenopathy, status post bronchoscopy and transbronchial ultrasound-guided lymph node biopsy at which time no evidence of malignancy was found with procedure performed on 11/21/2013. He still gets shortness of breath on exertion with chronic cough and expectoration of clear sputum. He denies any hemoptysis, epistaxis, melena, hematochezia, hematuria, lower extremity swelling or redness, PND, orthopnea, palpitations, nausea, vomiting, diarrhea, constipation, urinary hesitancy, skin rash, headache, or seizure.  MEDICAL HISTORY: Past Medical History  Diagnosis Date   Glaucoma     uses eye drops daily   Allergic rhinitis     takes Singulair at bedtime   Atrial fibrillation     takes Coumadin daily   Diastolic heart failure     LVEF 48-27%, rate 2 diastolic dysfunction 0/7867   Pulmonary hypertension     70 mmHg 03/2012   Asthma     Albuterol neb and inhaler as needed   Anxiety     takes Xanax daily as needed   COPD (chronic obstructive pulmonary disease)     Symbicort daily   Essential hypertension, benign     takes Metoprolol and Diltiazem daily   Anemia     takes ferrous sulfate daily   History of bronchitis    Shortness of breath     with exertion   Numbness      fingertips on both hands   Lung nodules    History of blood transfusion     INTERIM HISTORY: has Essential hypertension; Atrial fibrillation; Encounter for long-term (current) use of other medications; Tobacco abuse disorder; Diastolic CHF with preserved left ventricular function, NYHA class 4; Epistaxis; Acute blood loss anemia; COPD (chronic obstructive pulmonary disease); Insomnia secondary to anxiety; Glaucoma; Depression with anxiety; Hyperuricemia; Routine general medical examination at a health care facility; Chronic diastolic CHF (congestive heart failure); Acute on chronic diastolic heart failure; Pulmonary arterial hypertension; Acute respiratory failure with hypoxia; Normocytic anemia; Leukocytosis, unspecified; COPD with acute exacerbation; NSVT (nonsustained ventricular tachycardia); Encounter for therapeutic drug monitoring; Epistaxis, recurrent; ARF (acute renal failure); Abnormal CAT scan; Lung nodule; and Need for vaccination with 13-polyvalent pneumococcal conjugate vaccine on his problem list.    ALLERGIES:  is allergic to aspirin.  MEDICATIONS: has a current medication list which includes the following prescription(s): albuterol, albuterol, albuterol, allopurinol, alprazolam, brimonidine-timolol, budesonide-formoterol, diltiazem, mirtazapine, montelukast, potassium chloride, tiotropium, torsemide, tramadol, travoprost (benzalkonium), and warfarin.  SURGICAL HISTORY:  Past Surgical History  Procedure Laterality Date   Bilateral cataract surgery     Herniorrhapy     Tendon repair      Right hand surgical procedure for a tendon repair   Colonoscopy N/A 11/29/2012    Procedure: COLONOSCOPY;  Surgeon: Danie Binder, MD;  Location: [facility];  Service: Endoscopy;  Laterality: N/A;  1:00   Esophagogastroduodenoscopy N/A 11/29/2012    Procedure: ESOPHAGOGASTRODUODENOSCOPY (EGD);  Surgeon: Danie Binder, MD;  Location: [facility];  Service: Endoscopy;  Laterality:  N/A;   Eye surgery     Hernia repair     Radiology with anesthesia N/A 07/10/2013    Procedure: EMBOLIZATION-RADIOLOGY WITH ANESTHESIA;  Surgeon: Rob Hickman, MD;  Location: [facility];  Service: Radiology;  Laterality: N/A;   Nose surgery      d/t nosebleeds   Video bronchoscopy with endobronchial ultrasound N/A 11/21/2013    Procedure: VIDEO BRONCHOSCOPY WITH ENDOBRONCHIAL ULTRASOUND;  Surgeon: Melrose Nakayama, MD;  Location: [facility];  Service: Thoracic;  Laterality: N/A;   Mediastinoscopy N/A 11/21/2013    Procedure: MEDIASTINOSCOPY;  Surgeon: Melrose Nakayama, MD;  Location: [facility];  Service: Thoracic;  Laterality: N/A;    FAMILY HISTORY: family history includes Cancer in his father and mother; Colon cancer in his brother; Coronary artery disease in his brother.  SOCIAL HISTORY:  reports that he has been smoking Cigarettes.  He has a 30 pack-year smoking history. He has never used smokeless tobacco. He reports that he does not drink alcohol or use illicit drugs.  REVIEW OF SYSTEMS:  Other than that discussed above is noncontributory.  PHYSICAL EXAMINATION: ECOG PERFORMANCE STATUS: 1 - Symptomatic but completely ambulatory  Blood pressure 134/91, pulse 120, temperature 99 F (37.2 C), temperature source Oral, resp. rate 20, weight 142 lb 9.6 oz (64.683 kg).  GENERAL:alert, no distress and comfortable SKIN: skin color, texture, turgor are normal, no rashes or significant lesions EYES: PERLA; Conjunctiva are pink and non-injected, sclera clear SINUSES: No redness or tenderness over maxillary or ethmoid sinuses OROPHARYNX:no exudate, no erythema on lips, buccal mucosa, or tongue. NECK: supple, thyroid normal size, non-tender, without nodularity. No masses CHEST: increased AP diameter with no breast masses. LYMPH:  no palpable lymphadenopathy in the cervical, axillary or inguinal LUNGS: clear to auscultation and percussion with normal breathing effort HEART: irregularly  irregular with no S3. ABDOMEN:abdomen soft, non-tender and normal bowel sounds. Liver spleen not enlarged. No ascites. MUSCULOSKELETAL:no cyanosis of digits and no clubbing. Range of motion normal.  NEURO: alert & oriented x 3 with fluent speech, no focal motor/sensory deficits   LABORATORY DATA: Appointment on 01/23/2014  Component Date Value Ref Range Status   WBC 01/23/2014 10.1  4.0 - 10.5 K/uL Final   RBC 01/23/2014 3.75* 4.22 - 5.81 MIL/uL Final   Hemoglobin 01/23/2014 10.8* 13.0 - 17.0 g/dL Final   HCT 01/23/2014 34.5* 39.0 - 52.0 % Final   MCV 01/23/2014 92.0  78.0 - 100.0 fL Final   MCH 01/23/2014 28.8  26.0 - 34.0 pg Final   MCHC 01/23/2014 31.3  30.0 - 36.0 g/dL Final   RDW 01/23/2014 15.9* 11.5 - 15.5 % Final   Platelets 01/23/2014 276  150 - 400 K/uL Final   Neutrophils Relative % 01/23/2014 78* 43 - 77 % Final   Neutro Abs 01/23/2014 8.0* 1.7 - 7.7 K/uL Final   Lymphocytes Relative 01/23/2014 6* 12 - 46 % Final   Lymphs Abs 01/23/2014 0.6* 0.7 - 4.0 K/uL Final   Monocytes Relative 01/23/2014 15* 3 - 12 % Final   Monocytes Absolute 01/23/2014 1.5* 0.1 - 1.0 K/uL Final   Eosinophils Relative 01/23/2014 1  0 - 5 % Final   Eosinophils Absolute 01/23/2014 0.1  0.0 - 0.7 K/uL Final   Basophils Relative 01/23/2014 0  0 - 1 % Final   Basophils Absolute 01/23/2014 0.0  0.0 - 0.1 K/uL Final   Sodium 01/23/2014 139  137 - 147 mEq/L Final  Potassium 01/23/2014 4.4  3.7 - 5.3 mEq/L Final   Chloride 01/23/2014 95* 96 - 112 mEq/L Final   CO2 01/23/2014 32  19 - 32 mEq/L Final   Glucose, Bld 01/23/2014 102* 70 - 99 mg/dL Final   BUN 01/23/2014 16  6 - 23 mg/dL Final   Creatinine, Ser 01/23/2014 1.22  0.50 - 1.35 mg/dL Final   Calcium 01/23/2014 9.7  8.4 - 10.5 mg/dL Final   Total Protein 01/23/2014 8.2  6.0 - 8.3 g/dL Final   Albumin 01/23/2014 3.7  3.5 - 5.2 g/dL Final   AST 01/23/2014 20  0 - 37 U/L Final   ALT 01/23/2014 8  0 - 53 U/L Final    Alkaline Phosphatase 01/23/2014 156* 39 - 117 U/L Final   Total Bilirubin 01/23/2014 0.8  0.3 - 1.2 mg/dL Final   GFR calc non Af Amer 01/23/2014 57* >90 mL/min Final   GFR calc Af Amer 01/23/2014 66* >90 mL/min Final   Comment: (NOTE) The eGFR has been calculated using the CKD EPI equation. This calculation has not been validated in all clinical situations. eGFR's persistently <90 mL/min signify possible Chronic Kidney Disease.    Anion gap 01/23/2014 12  5 - 15 Final   LDH 01/23/2014 237  94 - 250 U/L Final   Prothrombin Time 01/23/2014 23.1* 11.6 - 15.2 seconds Final   INR 01/23/2014 2.03* 0.00 - 1.49 Final  Hospital Outpatient Visit on 01/21/2014  Component Date Value Ref Range Status   Creatinine, Ser 01/21/2014 1.20  0.50 - 1.35 mg/dL Final    PATHOLOGY:   Theodore [name] ([id number]) Patient: Theodore [name] Collected: 11/21/2013 Client: [facility] Accession: [id number] Received: 11/21/2013 [place] Charon DOB: [birth year] Age: 73 Gender: M Reported: 11/24/2013 [address] Patient Ph: [phone] MRN #: [id number] [place], [place] [address] Visit #: [id number] Chart #: Phone:  Fax: CC: REPORT OF SURGICAL PATHOLOGY FINAL DIAGNOSIS Diagnosis 1. Lymph node, biopsy, 4 R - ONE BENIGN LYMPH NODE (0/1). - SEE COMMENT. 2. Lymph node, biopsy, 4 L - ONE BENIGN LYMPH NODE (0/1) - SEE COMMENT. 3. Lymph node, biopsy, 4 R #2 - ONE BENIGN LYMPH NODE (0/1). - SEE COMMENT. Microscopic Comment 1. Multiple slide sections were reviewed. There are no intranodal metastatic tumor deposits nor granulomatous inflammation present. (CRR:gt, 11/24/13) Mali RUND DO Pathologist,   Urinalysis    Component Value Date/Time   COLORURINE AMBER* 11/19/2013 1522   APPEARANCEUR CLEAR 11/19/2013 1522   LABSPEC 1.018 11/19/2013 1522   PHURINE 6.0 11/19/2013 1522   GLUCOSEU NEGATIVE 11/19/2013 1522   HGBUR NEGATIVE 11/19/2013 1522   HGBUR trace-intact 05/06/2007 0853    BILIRUBINUR NEGATIVE 11/19/2013 1522   KETONESUR NEGATIVE 11/19/2013 1522   PROTEINUR NEGATIVE 11/19/2013 1522   UROBILINOGEN 1.0 11/19/2013 1522   NITRITE NEGATIVE 11/19/2013 1522   LEUKOCYTESUR NEGATIVE 11/19/2013 1522    RADIOGRAPHIC STUDIES: Ct Chest W Contrast  01/21/2014   CLINICAL DATA:  Followup pulmonary nodules and mediastinal lymphadenopathy.  EXAM: CT CHEST WITH CONTRAST  TECHNIQUE: Multidetector CT imaging of the chest was performed during intravenous contrast administration.  CONTRAST:  50m OMNIPAQUE IOHEXOL 300 MG/ML  SOLN  COMPARISON:  PET-CT on 10/17/2013  FINDINGS: Mediastinum/Hilar Regions: Mild mediastinal lymphadenopathy is seen in the right paratracheal and precarinal regions which measures up to 2.3 cm in short axis on image 25 and is stable compared to previous study. Mild right hilar lymphadenopathy is also stable. No new or increased areas of lymphadenopathy identified.  Other  Thoracic Lymphadenopathy:  None.  Lungs: Multiple bilateral subcentimeter pulmonary nodules all show mild decrease in size compared to prior exam, consistent with a benign inflammatory/atypical infectious etiology.  Mild emphysema and bilateral lower lobe bronchiectasis and scarring are again noted. A new patchy area of airspace opacity is seen in the posterior right lower lobe, consistent with atypical infectious or inflammatory etiology.  Pleura:  No evidence of effusion or mass.  Vascular/Cardiac:  Stable cardiomegaly.  Other:  None.  Musculoskeletal:  No suspicious bone lesions identified.  IMPRESSION: Sub-cm bilateral pulmonary nodules show mild decrease in size, consistent with benign atypical infectious/inflammatory etiology. New airspace disease also noted in the posterior left lower lobe, consistent with infectious/ inflammatory etiology.  Stable mild mediastinal and right hilar lymphadenopathy. Continued followup by CT recommended.  Stable emphysema, bilateral lower lobe scarring, and  bronchiectasis.  Stable cardiomegaly.   Electronically Signed   By: Earle Gell M.D.   On: 01/21/2014 13:54    ASSESSMENT:  #1. Pulmonary nodules with paratracheal adenopathy, biopsy negative. Normal angiotensin-converting enzyme level #2. Chronic obstructive pulmonary disease, on treatment.. #3. Gout, on treatment.  #4. Atrial fibrillation with controlled ventricular response, on warfarin.  #5. Chronic kidney disease, stage II.  #6. Epistaxis, status post endovascular supersensitive embolectomy of external carotid arteries.  #7. Normocytic normochromic anemia  PLAN:  #1. Follow-up in 3 months with CBC, chem profile, LDH.   All questions were answered. The patient knows to call the clinic with any problems, questions or concerns. We can certainly see the patient much sooner if necessary.   I spent 25 minutes counseling the patient face to face. The total time spent in the appointment was 30 minutes.    Doroteo Bradford, MD 01/23/2014 1:19 PM  DISCLAIMER:  This note was dictated with voice recognition software.  Similar sounding words can inadvertently be transcribed inaccurately and may not be corrected upon review.

## 2014-01-23 NOTE — Progress Notes (Signed)
Lab draw

## 2014-01-23 NOTE — Patient Instructions (Signed)
..  [facility] Discharge Instructions  RECOMMENDATIONS MADE BY THE CONSULTANT AND ANY TEST RESULTS WILL BE SENT TO YOUR REFERRING PHYSICIAN.  EXAM FINDINGS BY THE PHYSICIAN TODAY AND SIGNS OR SYMPTOMS TO REPORT TO CLINIC OR PRIMARY PHYSICIAN: Exam and findings as discussed by Dr. Barnet Glasgow.   INSTRUCTIONS/FOLLOW-UP: 3 months Cbc and cmet  Thank you for choosing [facility] to provide your oncology and hematology care.  To afford each patient quality time with our providers, please arrive at least 15 minutes before your scheduled appointment time.  With your help, our goal is to use those 15 minutes to complete the necessary work-up to ensure our physicians have the information they need to help with your evaluation and healthcare recommendations.    Effective January 1st, 2014, we ask that you re-schedule your appointment with our physicians should you arrive 10 or more minutes late for your appointment.  We strive to give you quality time with our providers, and arriving late affects you and other patients whose appointments are after yours.    Again, thank you for choosing [facility].  Our hope is that these requests will decrease the amount of time that you wait before being seen by our physicians.       _____________________________________________________________  Should you have questions after your visit to [facility], please contact our office at [phone] between the hours of 8:30 a.m. and 4:30 p.m.  Voicemails left after 4:30 p.m. will not be returned until the following business day.  For prescription refill requests, have your pharmacy contact our office with your prescription refill request.    _______________________________________________________________  We hope that we have given you very good care.  You may receive a patient satisfaction survey in the mail, please complete it and return it as soon as possible.   We value your feedback!  _______________________________________________________________  Have you asked about our STAR program?  STAR stands for Survivorship Training and Rehabilitation, and this is a nationally recognized cancer care program that focuses on survivorship and rehabilitation.  Cancer and cancer treatments may cause problems, such as, pain, making you feel tired and keeping you from doing the things that you need or want to do. Cancer rehabilitation can help. Our goal is to reduce these troubling effects and help you have the best quality of life possible.  You may receive a survey from a nurse that asks questions about your current state of health.  Based on the survey results, all eligible patients will be referred to the [facility] program for an evaluation so we can better serve you!  A frequently asked questions sheet is available upon request.

## 2014-01-24 LAB — ANGIOTENSIN CONVERTING ENZYME: Angiotensin-Converting Enzyme: 16 U/L (ref 8–52)

## 2014-01-26 NOTE — Telephone Encounter (Signed)
Noted and will notify patient of change in medication.

## 2014-01-28 NOTE — Telephone Encounter (Signed)
Called patient and left message for them to return call at the office   

## 2014-01-29 ENCOUNTER — Ambulatory Visit (INDEPENDENT_AMBULATORY_CARE_PROVIDER_SITE_OTHER): Payer: Medicare Other | Admitting: *Deleted

## 2014-01-29 DIAGNOSIS — Z5181 Encounter for therapeutic drug level monitoring: Secondary | ICD-10-CM

## 2014-01-29 DIAGNOSIS — I4891 Unspecified atrial fibrillation: Secondary | ICD-10-CM

## 2014-01-29 NOTE — Telephone Encounter (Signed)
Patient aware.  New med to be sent the end of December.

## 2014-02-09 ENCOUNTER — Telehealth: Payer: Self-pay | Admitting: Cardiovascular Disease

## 2014-02-09 MED ORDER — WARFARIN SODIUM 4 MG PO TABS: ORAL_TABLET | ORAL | Status: DC

## 2014-02-09 NOTE — Telephone Encounter (Signed)
Received fax refill request  Rx # [id number] Medication:  Warfarin Sodium 4 mg tablets Qty 45 Sig:  Take one tablet by mouth every day except take one and one -half tablet by mouth on Monday and Thursdays as directed Physician:  Bronson Ing

## 2014-02-10 ENCOUNTER — Inpatient Hospital Stay (HOSPITAL_COMMUNITY)
Admission: EM | Admit: 2014-02-10 | Discharge: 2014-02-26 | DRG: 371 | Disposition: A | Discharge status: Home Health | Payer: Medicare Other | Attending: Internal Medicine | Admitting: Internal Medicine

## 2014-02-10 ENCOUNTER — Emergency Department (HOSPITAL_COMMUNITY): Payer: Medicare Other

## 2014-02-10 ENCOUNTER — Encounter (HOSPITAL_COMMUNITY): Payer: Self-pay | Admitting: *Deleted

## 2014-02-10 DIAGNOSIS — Z7901 Long term (current) use of anticoagulants: Secondary | ICD-10-CM

## 2014-02-10 DIAGNOSIS — I272 Other secondary pulmonary hypertension: Secondary | ICD-10-CM | POA: Diagnosis present

## 2014-02-10 DIAGNOSIS — F419 Anxiety disorder, unspecified: Secondary | ICD-10-CM | POA: Diagnosis present

## 2014-02-10 DIAGNOSIS — R040 Epistaxis: Secondary | ICD-10-CM | POA: Diagnosis not present

## 2014-02-10 DIAGNOSIS — Z720 Tobacco use: Secondary | ICD-10-CM | POA: Diagnosis present

## 2014-02-10 DIAGNOSIS — Z4659 Encounter for fitting and adjustment of other gastrointestinal appliance and device: Secondary | ICD-10-CM

## 2014-02-10 DIAGNOSIS — N182 Chronic kidney disease, stage 2 (mild): Secondary | ICD-10-CM | POA: Diagnosis present

## 2014-02-10 DIAGNOSIS — D649 Anemia, unspecified: Secondary | ICD-10-CM | POA: Diagnosis present

## 2014-02-10 DIAGNOSIS — I2781 Cor pulmonale (chronic): Secondary | ICD-10-CM | POA: Diagnosis present

## 2014-02-10 DIAGNOSIS — Z978 Presence of other specified devices: Secondary | ICD-10-CM

## 2014-02-10 DIAGNOSIS — F17210 Nicotine dependence, cigarettes, uncomplicated: Secondary | ICD-10-CM | POA: Diagnosis present

## 2014-02-10 DIAGNOSIS — J449 Chronic obstructive pulmonary disease, unspecified: Secondary | ICD-10-CM | POA: Diagnosis present

## 2014-02-10 DIAGNOSIS — Z886 Allergy status to analgesic agent status: Secondary | ICD-10-CM | POA: Diagnosis not present

## 2014-02-10 DIAGNOSIS — A419 Sepsis, unspecified organism: Secondary | ICD-10-CM | POA: Diagnosis present

## 2014-02-10 DIAGNOSIS — N170 Acute kidney failure with tubular necrosis: Secondary | ICD-10-CM | POA: Diagnosis present

## 2014-02-10 DIAGNOSIS — IMO0002 Reserved for concepts with insufficient information to code with codable children: Secondary | ICD-10-CM

## 2014-02-10 DIAGNOSIS — J811 Chronic pulmonary edema: Secondary | ICD-10-CM

## 2014-02-10 DIAGNOSIS — R0600 Dyspnea, unspecified: Secondary | ICD-10-CM

## 2014-02-10 DIAGNOSIS — K529 Noninfective gastroenteritis and colitis, unspecified: Secondary | ICD-10-CM

## 2014-02-10 DIAGNOSIS — K3533 Acute appendicitis with perforation and localized peritonitis, with abscess: Secondary | ICD-10-CM

## 2014-02-10 DIAGNOSIS — N179 Acute kidney failure, unspecified: Secondary | ICD-10-CM | POA: Diagnosis present

## 2014-02-10 DIAGNOSIS — R140 Abdominal distension (gaseous): Secondary | ICD-10-CM

## 2014-02-10 DIAGNOSIS — R52 Pain, unspecified: Secondary | ICD-10-CM

## 2014-02-10 DIAGNOSIS — I129 Hypertensive chronic kidney disease with stage 1 through stage 4 chronic kidney disease, or unspecified chronic kidney disease: Secondary | ICD-10-CM | POA: Diagnosis present

## 2014-02-10 DIAGNOSIS — K353 Acute appendicitis with localized peritonitis: Secondary | ICD-10-CM | POA: Diagnosis present

## 2014-02-10 DIAGNOSIS — J45909 Unspecified asthma, uncomplicated: Secondary | ICD-10-CM | POA: Diagnosis present

## 2014-02-10 DIAGNOSIS — I5031 Acute diastolic (congestive) heart failure: Secondary | ICD-10-CM | POA: Insufficient documentation

## 2014-02-10 DIAGNOSIS — D72829 Elevated white blood cell count, unspecified: Secondary | ICD-10-CM | POA: Diagnosis present

## 2014-02-10 DIAGNOSIS — E860 Dehydration: Secondary | ICD-10-CM | POA: Diagnosis present

## 2014-02-10 DIAGNOSIS — Z9981 Dependence on supplemental oxygen: Secondary | ICD-10-CM

## 2014-02-10 DIAGNOSIS — E876 Hypokalemia: Secondary | ICD-10-CM

## 2014-02-10 DIAGNOSIS — R079 Chest pain, unspecified: Secondary | ICD-10-CM

## 2014-02-10 DIAGNOSIS — H409 Unspecified glaucoma: Secondary | ICD-10-CM | POA: Diagnosis present

## 2014-02-10 DIAGNOSIS — K352 Acute appendicitis with generalized peritonitis: Secondary | ICD-10-CM

## 2014-02-10 DIAGNOSIS — Z9842 Cataract extraction status, left eye: Secondary | ICD-10-CM

## 2014-02-10 DIAGNOSIS — K567 Ileus, unspecified: Secondary | ICD-10-CM | POA: Diagnosis not present

## 2014-02-10 DIAGNOSIS — I5030 Unspecified diastolic (congestive) heart failure: Secondary | ICD-10-CM | POA: Diagnosis present

## 2014-02-10 DIAGNOSIS — Z9841 Cataract extraction status, right eye: Secondary | ICD-10-CM

## 2014-02-10 DIAGNOSIS — I10 Essential (primary) hypertension: Secondary | ICD-10-CM | POA: Diagnosis present

## 2014-02-10 DIAGNOSIS — T501X5A Adverse effect of loop [high-ceiling] diuretics, initial encounter: Secondary | ICD-10-CM | POA: Diagnosis not present

## 2014-02-10 DIAGNOSIS — J479 Bronchiectasis, uncomplicated: Secondary | ICD-10-CM | POA: Diagnosis present

## 2014-02-10 DIAGNOSIS — K3532 Acute appendicitis with perforation and localized peritonitis, without abscess: Secondary | ICD-10-CM | POA: Diagnosis present

## 2014-02-10 DIAGNOSIS — I4891 Unspecified atrial fibrillation: Secondary | ICD-10-CM | POA: Diagnosis present

## 2014-02-10 DIAGNOSIS — K37 Unspecified appendicitis: Secondary | ICD-10-CM | POA: Diagnosis present

## 2014-02-10 DIAGNOSIS — I482 Chronic atrial fibrillation: Secondary | ICD-10-CM | POA: Diagnosis present

## 2014-02-10 DIAGNOSIS — N178 Other acute kidney failure: Secondary | ICD-10-CM

## 2014-02-10 DIAGNOSIS — R059 Cough, unspecified: Secondary | ICD-10-CM

## 2014-02-10 DIAGNOSIS — Z7951 Long term (current) use of inhaled steroids: Secondary | ICD-10-CM | POA: Diagnosis not present

## 2014-02-10 DIAGNOSIS — Z79899 Other long term (current) drug therapy: Secondary | ICD-10-CM | POA: Diagnosis not present

## 2014-02-10 DIAGNOSIS — I5033 Acute on chronic diastolic (congestive) heart failure: Secondary | ICD-10-CM | POA: Diagnosis present

## 2014-02-10 DIAGNOSIS — R05 Cough: Secondary | ICD-10-CM

## 2014-02-10 DIAGNOSIS — L0291 Cutaneous abscess, unspecified: Secondary | ICD-10-CM

## 2014-02-10 LAB — COMPREHENSIVE METABOLIC PANEL
ALK PHOS: 127 U/L — AB (ref 39–117)
ALT: 6 U/L (ref 0–53)
AST: 18 U/L (ref 0–37)
Albumin: 3 g/dL — ABNORMAL LOW (ref 3.5–5.2)
Anion gap: 15 (ref 5–15)
BUN: 25 mg/dL — ABNORMAL HIGH (ref 6–23)
CALCIUM: 8.9 mg/dL (ref 8.4–10.5)
CO2: 30 meq/L (ref 19–32)
Chloride: 93 mEq/L — ABNORMAL LOW (ref 96–112)
Creatinine, Ser: 1.55 mg/dL — ABNORMAL HIGH (ref 0.50–1.35)
GFR, EST AFRICAN AMERICAN: 49 mL/min — AB (ref >90)
GFR, EST NON AFRICAN AMERICAN: 42 mL/min — AB (ref >90)
GLUCOSE: 139 mg/dL — AB (ref 70–99)
POTASSIUM: 5.2 meq/L (ref 3.7–5.3)
SODIUM: 138 meq/L (ref 137–147)
Total Bilirubin: 1.3 mg/dL — ABNORMAL HIGH (ref 0.3–1.2)
Total Protein: 8 g/dL (ref 6.0–8.3)

## 2014-02-10 LAB — PROTIME-INR
INR: 2.21 — ABNORMAL HIGH (ref 0.00–1.49)
PROTHROMBIN TIME: 24.7 s — AB (ref 11.6–15.2)

## 2014-02-10 LAB — URINALYSIS, ROUTINE W REFLEX MICROSCOPIC
Bilirubin Urine: NEGATIVE
GLUCOSE, UA: NEGATIVE mg/dL
KETONES UR: NEGATIVE mg/dL
Leukocytes, UA: NEGATIVE
Nitrite: NEGATIVE
PH: 5.5 (ref 5.0–8.0)
PROTEIN: 30 mg/dL — AB
Specific Gravity, Urine: <1.005 — ABNORMAL LOW (ref 1.005–1.030)
Urobilinogen, UA: 0.2 mg/dL (ref 0.0–1.0)

## 2014-02-10 LAB — CBC WITH DIFFERENTIAL/PLATELET
BASOS ABS: 0 10*3/uL (ref 0.0–0.1)
Basophils Relative: 0 % (ref 0–1)
Eosinophils Absolute: 0 10*3/uL (ref 0.0–0.7)
Eosinophils Relative: 0 % (ref 0–5)
HCT: 34.3 % — ABNORMAL LOW (ref 39.0–52.0)
Hemoglobin: 11 g/dL — ABNORMAL LOW (ref 13.0–17.0)
LYMPHS ABS: 0.7 10*3/uL (ref 0.7–4.0)
LYMPHS PCT: 3 % — AB (ref 12–46)
MCH: 29.3 pg (ref 26.0–34.0)
MCHC: 32.1 g/dL (ref 30.0–36.0)
MCV: 91.2 fL (ref 78.0–100.0)
Monocytes Absolute: 1.4 10*3/uL — ABNORMAL HIGH (ref 0.1–1.0)
Monocytes Relative: 5 % (ref 3–12)
NEUTROS ABS: 24.4 10*3/uL — AB (ref 1.7–7.7)
NEUTROS PCT: 92 % — AB (ref 43–77)
PLATELETS: 341 10*3/uL (ref 150–400)
RBC: 3.76 MIL/uL — AB (ref 4.22–5.81)
RDW: 16.4 % — AB (ref 11.5–15.5)
WBC: 26.5 10*3/uL — AB (ref 4.0–10.5)

## 2014-02-10 LAB — URINE MICROSCOPIC-ADD ON

## 2014-02-10 LAB — POCT I-STAT, CHEM 8
BUN: 32 mg/dL — ABNORMAL HIGH (ref 6–23)
CHLORIDE: 94 meq/L — AB (ref 96–112)
Calcium, Ion: 1.03 mmol/L — ABNORMAL LOW (ref 1.13–1.30)
Creatinine, Ser: 1.6 mg/dL — ABNORMAL HIGH (ref 0.50–1.35)
Glucose, Bld: 132 mg/dL — ABNORMAL HIGH (ref 70–99)
HCT: 38 % — ABNORMAL LOW (ref 39.0–52.0)
Hemoglobin: 12.9 g/dL — ABNORMAL LOW (ref 13.0–17.0)
Potassium: 4.8 mEq/L (ref 3.7–5.3)
SODIUM: 136 meq/L — AB (ref 137–147)
TCO2: 30 mmol/L (ref 0–100)

## 2014-02-10 LAB — LIPASE, BLOOD: Lipase: 9 U/L — ABNORMAL LOW (ref 11–59)

## 2014-02-10 MED ORDER — ALBUTEROL SULFATE (2.5 MG/3ML) 0.083% IN NEBU
2.5000 mg | INHALATION_SOLUTION | Freq: Three times a day (TID) | RESPIRATORY_TRACT | Status: DC | PRN
  Administered 2014-02-11 – 2014-02-25 (×13): 2.5 mg via RESPIRATORY_TRACT
  Filled 2014-02-10 (×15): qty 3

## 2014-02-10 MED ORDER — BUDESONIDE-FORMOTEROL FUMARATE 160-4.5 MCG/ACT IN AERO
2.0000 | INHALATION_SPRAY | Freq: Two times a day (BID) | RESPIRATORY_TRACT | Status: DC
  Administered 2014-02-10 – 2014-02-26 (×27): 2 via RESPIRATORY_TRACT
  Filled 2014-02-10 (×2): qty 6

## 2014-02-10 MED ORDER — IOHEXOL 300 MG/ML  SOLN
50.0000 mL | Freq: Once | INTRAMUSCULAR | Status: AC | PRN
  Administered 2014-02-10: 50 mL via ORAL

## 2014-02-10 MED ORDER — ACETAMINOPHEN 325 MG PO TABS
650.0000 mg | ORAL_TABLET | Freq: Four times a day (QID) | ORAL | Status: DC | PRN
  Administered 2014-02-10: 650 mg via ORAL
  Filled 2014-02-10: qty 2

## 2014-02-10 MED ORDER — BRIMONIDINE TARTRATE 0.2 % OP SOLN
1.0000 [drp] | Freq: Two times a day (BID) | OPHTHALMIC | Status: DC
  Administered 2014-02-10 – 2014-02-25 (×29): 1 [drp] via OPHTHALMIC
  Filled 2014-02-10 (×2): qty 5

## 2014-02-10 MED ORDER — MONTELUKAST SODIUM 10 MG PO TABS
10.0000 mg | ORAL_TABLET | Freq: Every day | ORAL | Status: DC
  Administered 2014-02-10 – 2014-02-17 (×6): 10 mg via ORAL
  Filled 2014-02-10 (×10): qty 1

## 2014-02-10 MED ORDER — ONDANSETRON HCL 4 MG PO TABS: 4.0000 mg | ORAL_TABLET | Freq: Four times a day (QID) | ORAL | Status: DC | PRN

## 2014-02-10 MED ORDER — VANCOMYCIN HCL 10 G IV SOLR
1250.0000 mg | Freq: Once | INTRAVENOUS | Status: AC
  Administered 2014-02-10: 1250 mg via INTRAVENOUS
  Filled 2014-02-10: qty 1250

## 2014-02-10 MED ORDER — ONDANSETRON HCL 4 MG/2ML IJ SOLN
4.0000 mg | Freq: Once | INTRAMUSCULAR | Status: AC
  Administered 2014-02-10: 4 mg via INTRAVENOUS
  Filled 2014-02-10: qty 2

## 2014-02-10 MED ORDER — MORPHINE SULFATE 2 MG/ML IJ SOLN
1.0000 mg | INTRAMUSCULAR | Status: DC | PRN
  Administered 2014-02-11 – 2014-02-19 (×7): 1 mg via INTRAVENOUS
  Filled 2014-02-10 (×7): qty 1

## 2014-02-10 MED ORDER — TIMOLOL MALEATE 0.5 % OP SOLN
1.0000 [drp] | Freq: Two times a day (BID) | OPHTHALMIC | Status: DC
  Administered 2014-02-11 – 2014-02-26 (×30): 1 [drp] via OPHTHALMIC
  Filled 2014-02-10 (×4): qty 5

## 2014-02-10 MED ORDER — IOHEXOL 300 MG/ML  SOLN
100.0000 mL | Freq: Once | INTRAMUSCULAR | Status: AC | PRN
  Administered 2014-02-10: 100 mL via INTRAVENOUS

## 2014-02-10 MED ORDER — PIPERACILLIN-TAZOBACTAM 3.375 G IVPB 30 MIN
3.3750 g | Freq: Once | INTRAVENOUS | Status: AC
  Administered 2014-02-10: 3.375 g via INTRAVENOUS
  Filled 2014-02-10: qty 50

## 2014-02-10 MED ORDER — TIOTROPIUM BROMIDE MONOHYDRATE 18 MCG IN CAPS
18.0000 ug | ORAL_CAPSULE | Freq: Every day | RESPIRATORY_TRACT | Status: DC
  Administered 2014-02-11 – 2014-02-26 (×11): 18 ug via RESPIRATORY_TRACT
  Filled 2014-02-10 (×4): qty 5

## 2014-02-10 MED ORDER — SODIUM CHLORIDE 0.9 % IV BOLUS (SEPSIS)
1000.0000 mL | Freq: Once | INTRAVENOUS | Status: AC
  Administered 2014-02-10: 1000 mL via INTRAVENOUS

## 2014-02-10 MED ORDER — LATANOPROST 0.005 % OP SOLN
1.0000 [drp] | Freq: Every day | OPHTHALMIC | Status: DC
  Administered 2014-02-10 – 2014-02-25 (×16): 1 [drp] via OPHTHALMIC
  Filled 2014-02-10 (×2): qty 2.5

## 2014-02-10 MED ORDER — SODIUM CHLORIDE 0.9 % IV SOLN
INTRAVENOUS | Status: DC
  Administered 2014-02-10: 16:00:00 via INTRAVENOUS

## 2014-02-10 MED ORDER — HYDROMORPHONE HCL 1 MG/ML IJ SOLN
1.0000 mg | Freq: Once | INTRAMUSCULAR | Status: AC
  Administered 2014-02-10: 1 mg via INTRAVENOUS
  Filled 2014-02-10: qty 1

## 2014-02-10 MED ORDER — BRIMONIDINE TARTRATE-TIMOLOL 0.2-0.5 % OP SOLN: 1.0000 [drp] | Freq: Two times a day (BID) | OPHTHALMIC | Status: DC

## 2014-02-10 MED ORDER — SODIUM CHLORIDE 0.9 % IJ SOLN
3.0000 mL | Freq: Two times a day (BID) | INTRAMUSCULAR | Status: DC
  Administered 2014-02-10 – 2014-02-24 (×13): 3 mL via INTRAVENOUS

## 2014-02-10 MED ORDER — ONDANSETRON HCL 4 MG/2ML IJ SOLN
4.0000 mg | Freq: Four times a day (QID) | INTRAMUSCULAR | Status: DC | PRN
  Administered 2014-02-23: 4 mg via INTRAVENOUS
  Filled 2014-02-10: qty 2

## 2014-02-10 MED ORDER — ALUM & MAG HYDROXIDE-SIMETH 200-200-20 MG/5ML PO SUSP: 30.0000 mL | Freq: Four times a day (QID) | ORAL | Status: DC | PRN

## 2014-02-10 MED ORDER — ALLOPURINOL 300 MG PO TABS
300.0000 mg | ORAL_TABLET | Freq: Every day | ORAL | Status: DC
  Administered 2014-02-10 – 2014-02-26 (×11): 300 mg via ORAL
  Filled 2014-02-10 (×17): qty 1

## 2014-02-10 MED ORDER — HYDROCODONE-ACETAMINOPHEN 5-325 MG PO TABS
1.0000 | ORAL_TABLET | ORAL | Status: DC | PRN
  Administered 2014-02-10 – 2014-02-16 (×4): 2 via ORAL
  Filled 2014-02-10 (×5): qty 2

## 2014-02-10 MED ORDER — TRAZODONE HCL 50 MG PO TABS
25.0000 mg | ORAL_TABLET | Freq: Every evening | ORAL | Status: DC | PRN
  Administered 2014-02-11 – 2014-02-17 (×3): 50 mg via ORAL
  Filled 2014-02-10 (×3): qty 1

## 2014-02-10 MED ORDER — ACETAMINOPHEN 650 MG RE SUPP: 650.0000 mg | Freq: Four times a day (QID) | RECTAL | Status: DC | PRN

## 2014-02-10 MED ORDER — TRAVOPROST 0.004 % OP SOLN: 1.0000 [drp] | Freq: Every day | OPHTHALMIC | Status: DC

## 2014-02-10 MED ORDER — DILTIAZEM HCL ER COATED BEADS 180 MG PO CP24
180.0000 mg | ORAL_CAPSULE | Freq: Every day | ORAL | Status: DC
  Administered 2014-02-10 – 2014-02-16 (×4): 180 mg via ORAL
  Filled 2014-02-10 (×8): qty 1

## 2014-02-10 MED ORDER — VANCOMYCIN HCL IN DEXTROSE 1-5 GM/200ML-% IV SOLN
1000.0000 mg | INTRAVENOUS | Status: DC
  Administered 2014-02-11: 1000 mg via INTRAVENOUS
  Filled 2014-02-10 (×2): qty 200

## 2014-02-10 MED ORDER — ALBUTEROL SULFATE HFA 108 (90 BASE) MCG/ACT IN AERS: 2.0000 | INHALATION_SPRAY | Freq: Four times a day (QID) | RESPIRATORY_TRACT | Status: DC | PRN

## 2014-02-10 MED ORDER — PIPERACILLIN-TAZOBACTAM 3.375 G IVPB
3.3750 g | Freq: Three times a day (TID) | INTRAVENOUS | Status: DC
  Administered 2014-02-10 – 2014-02-12 (×5): 3.375 g via INTRAVENOUS
  Filled 2014-02-10 (×10): qty 50

## 2014-02-10 MED ORDER — ALPRAZOLAM 0.25 MG PO TABS: 0.2500 mg | ORAL_TABLET | Freq: Every day | ORAL | Status: DC | PRN

## 2014-02-10 NOTE — H&P (Signed)
[facility] History and Physical  Theodore [name] JSH:[id number] DOB: [birth year] DOA: 02/10/2014  Referring physician:  PCP: Tula Nakayama, MD   Chief Complaint: abdominal pain  HPI: Theodore [name] is a very pleasant 73 y.o. male with a past medical history that includes A. fib currently on Coumadin, diastolic heart failure, COPD, hypertension, anemia presented to the emergency department with the chief complaint of abdominal pain. Initial evaluation in the emergency department reveals ruptured appendicitis with associated abscess and acute renal failure. Patient reports developing sudden onset upper abdominal pain yesterday. Describes the pain as constant sharp worse with movement. Associated symptoms include nausea without vomiting decreased appetite and subjective fever with chills as well as generalized weakness and near syncope. He denies chest pain palpitations shortness of breath headache usual disturbances numbness tingling of extremities. He denies diarrhea constipation melena dysuria hematuria frequency or urgency. Workup in the emergency department includes complete metabolic panel significant for chloride of 93, creatinine 1.55 serum glucose 139 alkaline phosphatase iron 27, complete blood count significant for a leukocytosis of 26.5 mild anemia at 11.0. INR 2.21. CT of the abdomen/pelvis reveals ruptured appendicitis with an associated approximately 4.5 x 3.2 cm abscess as detailed above. An adjacent loop of small bowel also demonstrates associated secondary Enteritis. Bilateral lower lobe bronchiectatic changes and consolidation are suspicious for chronic aspiration pneumonitis. Cardiac enlargement, specifically involving the right atrium.  The time of my exam he has a fever of 99.9 blood pressure 114/66 and a heart rate of 116. He has not hypoxic. He is given Dilaudid for pain and Zofran for nausea as well as one dose of Zosyn. He's also provided with 1 L of normal  saline. Review of Systems:  10 point review of systems complete and all systems are negative except as indicated in the history of present illness  Past Medical History  Diagnosis Date  . Glaucoma     uses eye drops daily  . Allergic rhinitis     takes Singulair at bedtime  . Atrial fibrillation     takes Coumadin daily  . Diastolic heart failure     LVEF 85-02%, rate 2 diastolic dysfunction 09/7410  . Pulmonary hypertension     70 mmHg 03/2012  . Asthma     Albuterol neb and inhaler as needed  . Anxiety     takes Xanax daily as needed  . COPD (chronic obstructive pulmonary disease)     Symbicort daily  . Essential hypertension, benign     takes Metoprolol and Diltiazem daily  . Anemia     takes ferrous sulfate daily  . History of bronchitis   . Shortness of breath     with exertion  . Numbness     fingertips on both hands  . Lung nodules   . History of blood transfusion    Past Surgical History  Procedure Laterality Date  . Bilateral cataract surgery    . Herniorrhapy    . Tendon repair      Right hand surgical procedure for a tendon repair  . Colonoscopy N/A 11/29/2012    Procedure: COLONOSCOPY;  Surgeon: Danie Binder, MD;  [facility];  Service: Endoscopy;  Laterality: N/A;  1:00  . Esophagogastroduodenoscopy N/A 11/29/2012    Procedure: ESOPHAGOGASTRODUODENOSCOPY (EGD);  Surgeon: Danie Binder, MD;  [facility];  Service: Endoscopy;  Laterality: N/A;  . Eye surgery    . Hernia repair    . Radiology with anesthesia N/A 07/10/2013  Procedure: EMBOLIZATION-RADIOLOGY WITH ANESTHESIA;  Surgeon: Rob Hickman, MD;  Location: [place];  Service: Radiology;  Laterality: N/A;  . Nose surgery      d/t nosebleeds  . Video bronchoscopy with endobronchial ultrasound N/A 11/21/2013    Procedure: VIDEO BRONCHOSCOPY WITH ENDOBRONCHIAL ULTRASOUND;  Surgeon: Melrose Nakayama, MD;  Location: [place];  Service: Thoracic;  Laterality: N/A;  .  Mediastinoscopy N/A 11/21/2013    Procedure: MEDIASTINOSCOPY;  Surgeon: Melrose Nakayama, MD;  Location: [place];  Service: Thoracic;  Laterality: N/A;   Social History:  reports that he has been smoking Cigarettes.  He has a 30 pack-year smoking history. He has never used smokeless tobacco. He reports that he does not drink alcohol or use illicit drugs.  Allergies  Allergen Reactions  . Aspirin Other (See Comments)    On coumadin    Family History  Problem Relation Age of Onset  . Cancer Mother   . Cancer Father   . Coronary artery disease Brother   . Colon cancer Brother      Prior to Admission medications   Medication Sig Start Date End Date Taking? Authorizing Provider  albuterol (PROAIR HFA) 108 (90 BASE) MCG/ACT inhaler Inhale 2 puffs into the lungs every 6 (six) hours as needed for wheezing or shortness of breath. 05/23/13  Yes Fayrene Helper, MD  albuterol (PROVENTIL) (2.5 MG/3ML) 0.083% nebulizer solution Take 2.5 mg by nebulization every 8 (eight) hours as needed for wheezing or shortness of breath.   Yes Historical Provider, MD  allopurinol (ZYLOPRIM) 300 MG tablet Take 1 tablet (300 mg total) by mouth daily. 05/23/13  Yes Fayrene Helper, MD  ALPRAZolam Duanne Moron) 0.25 MG tablet Take 0.25 mg by mouth daily as needed for anxiety.   Yes Historical Provider, MD  brimonidine-timolol (COMBIGAN) 0.2-0.5 % ophthalmic solution Place 1 drop into both eyes every 12 (twelve) hours.   Yes Historical Provider, MD  budesonide-formoterol (SYMBICORT) 160-4.5 MCG/ACT inhaler Inhale 2 puffs into the lungs 2 (two) times daily. 05/23/13  Yes Fayrene Helper, MD  diltiazem (CARDIZEM CD) 180 MG 24 hr capsule Take 1 capsule (180 mg total) by mouth daily. 09/22/13  Yes Herminio Commons, MD  mirtazapine (REMERON) 15 MG tablet Take 1 tablet (15 mg total) by mouth at bedtime. 12/30/13  Yes Fayrene Helper, MD  montelukast (SINGULAIR) 10 MG tablet Take 10 mg by mouth at bedtime.   Yes  Historical Provider, MD  potassium chloride (K-DUR) 10 MEQ tablet Take 10 mEq by mouth 2 (two) times daily.  06/13/13  Yes Tanna Furry, MD  tiotropium (SPIRIVA) 18 MCG inhalation capsule Place 18 mcg into inhaler and inhale daily.   Yes Historical Provider, MD  torsemide (DEMADEX) 20 MG tablet Take 2 tablets (40 mg total) by mouth daily. 10/02/13  Yes Herminio Commons, MD  traMADol (ULTRAM) 50 MG tablet Take 50 mg by mouth daily as needed (pain).   Yes Historical Provider, MD  travoprost, benzalkonium, (TRAVATAN) 0.004 % ophthalmic solution Place 1 drop into both eyes at bedtime.   Yes Historical Provider, MD  traZODone (DESYREL) 50 MG tablet Take 0.5-1 tablets (25-50 mg total) by mouth at bedtime as needed for sleep. 03/13/14  Yes Fayrene Helper, MD  warfarin (COUMADIN) 4 MG tablet Take 1.5 tabs (6 mg) on Monday and Thursday and 1 tab (4mg ) on other days Patient taking differently: Take 4-6 mg by mouth daily. Take 1.5 tabs (6 mg) on Monday and Thursday and 1  tab (4mg ) on other days 02/09/14  Yes Herminio Commons, MD  albuterol (PROVENTIL) (2.5 MG/3ML) 0.083% nebulizer solution INHALE THE CONTENTS OF 1 VIAL USING THE NEBULIZER EVERY 6 HOURS AS NEEDED Patient not taking: Reported on 02/10/2014 01/13/14   Fayrene Helper, MD  traMADol (ULTRAM) 50 MG tablet TAKE 1 TABLET BY MOUTH EVERY DAY AS NEEDED Patient not taking: Reported on 02/10/2014 11/19/13   Fayrene Helper, MD   Physical Exam: Filed Vitals:   02/10/14 1035 02/10/14 1300  BP: 144/75 114/66  Pulse: 95 116  Temp: 99.9 F (37.7 C)   TempSrc: Oral   Resp: 20 26  Height: 5\' 6"  (1.676 m)   Weight: 63.504 kg (140 lb)   SpO2:  100%    Wt Readings from Last 3 Encounters:  02/10/14 63.504 kg (140 lb)  01/23/14 64.683 kg (142 lb 9.6 oz)  12/30/13 64.012 kg (141 lb 1.9 oz)    General:  Appears calm and comfortable Eyes: PERRL, normal lids, irises & conjunctiva ENT: grossly normal hearing, lips & tongue Neck: no LAD, masses or  thyromegaly Cardiovascular: Tachycardic but regular no m/r/g. No LE edema. Pulses present and palpable Respiratory: CTA bilaterally, no w/r/r. Normal respiratory effort. Hear no wheeze no crackles Abdomen: Slightly distended but soft sluggish bowel sounds diffuse tenderness particularly in the right lower quadrant no guarding Skin: no rash or induration seen on limited exam Musculoskeletal: grossly normal tone BUE/BLE Psychiatric: grossly normal mood and affect, speech fluent and appropriate Neurologic: grossly non-focal.          Labs on Admission:  Basic Metabolic Panel:  Recent Labs Lab 02/10/14 1106  NA 138  K 5.2  CL 93*  CO2 30  GLUCOSE 139*  BUN 25*  CREATININE 1.55*  CALCIUM 8.9   Liver Function Tests:  Recent Labs Lab 02/10/14 1106  AST 18  ALT 6  ALKPHOS 127*  BILITOT 1.3*  PROT 8.0  ALBUMIN 3.0*    Recent Labs Lab 02/10/14 1106  LIPASE 9*   No results for input(s): AMMONIA in the last 168 hours. CBC:  Recent Labs Lab 02/10/14 1106  WBC 26.5*  NEUTROABS 24.4*  HGB 11.0*  HCT 34.3*  MCV 91.2  PLT 341   Cardiac Enzymes: No results for input(s): CKTOTAL, CKMB, CKMBINDEX, TROPONINI in the last 168 hours.  BNP (last 3 results)  Recent Labs  06/16/13 1554 06/17/13 0537 07/09/13 0555  PROBNP 3560.0* 3222.0* 3842.0*   CBG: No results for input(s): GLUCAP in the last 168 hours.  Radiological Exams on Admission: Ct Abdomen Pelvis W Contrast  02/10/2014   CLINICAL DATA:  73 year old with left lower quadrant pain and tenderness  EXAM: CT ABDOMEN AND PELVIS WITH CONTRAST  TECHNIQUE: Multidetector CT imaging of the abdomen and pelvis was performed using the standard protocol following bolus administration of intravenous contrast.  CONTRAST:  68mL OMNIPAQUE IOHEXOL 300 MG/ML SOLN, 134mL OMNIPAQUE IOHEXOL 300 MG/ML SOLN  COMPARISON:  None.  FINDINGS: Chest:The partially visualized chest demonstrates an enlarged heart, specifically the right atrium.  Bronchiectatic changes are noted within the lung bases, associated areas of linear consolidation are also present in the lung bases. There is no pleural effusion.  Liver: No focal hepatic masses identified. There is no intrahepatic biliary ductal dilatation.  Gallbladder: Unremarkable.  Spleen: Unremarkable.  Pancreas: Unremarkable.  Adrenal glands: Unremarkable.  Kidneys: There is no nephrolithiasis or hydronephrosis. No suspicious renal mass is seen.  Bowel/gastrointestinal tract: Diffuse inflammatory fat stranding is noted within the right  lower quadrant. A small rim enhancing fluid collection is also present within the right lower quadrant measuring approximately 4.5 x 3.2 cm (series 2, image 53). Tiny foci of free air are noted just anterior to this fluid collection within the right lower quadrant. An adjacent loop of small bowel with diffuse wall thickening and wall edema is present. The appendix is not identified. This constellation of finding is concerning for a a ruptured appendicitis.  Pelvis: There is no pelvic mass. The prostate gland is enlarged and measures approximately 5.0 x 4.0 cm.  Miscellaneous: Scattered vascular calcifications are present.  Osseous structures: Multilevel degenerative changes of the spine are noted.  IMPRESSION: 1. Findings are concerning for ruptured appendicitis with an associated approximately 4.5 x 3.2 cm abscess as detailed above. An adjacent loop of small bowel also demonstrates associated secondary enteritis. These results were called by telephone at the time of interpretation on 02/10/2014 at 1:25 pm to Dr. Milton Ferguson , who verbally acknowledged these results.  2. Bilateral lower lobe bronchiectatic changes and consolidation are suspicious for chronic aspiration pneumonitis.  3.  Cardiac enlargement, specifically involving the right atrium.   Electronically Signed   By: Rosemarie Ax   On: 02/10/2014 13:32    EKG: Independently reviewed.  Pending  Assessment/Plan Principal Problem:   Appendicitis perforation with contained abscess. Will admit to telemetry. He was evaluated by general surgery who opines no peritonitis and no surgical intervention.  Likely will need percutaneous drainage by interventional radiology. Will request IR consult. Will hold coumadin. Will make NPO past midnight and continue zosyn.  Active Problems:   ARF (acute renal failure): likely related to dehydration secondary to decreased po intake. No hx same. Will hold nephrotoxins, gently hydrate. Recheck in am. Monitor intake and output   Leukocytosis: secondary to #1. Max temp 99.9. Will get blood cultures. Urinalysis pending. Chest xray some concern for aspiration pneumonitis. Will continue zosyn and monitor   Atrial fibrillation: poor rated control as he has not had his daily meds. Will resume home cardizem. Will obtain TSH. Monitor on tele. No chest pain.     Essential hypertension: fair control given pain and no anti-hypertensive medications today. Will resume home med. Manage pain. Monitor.      Tobacco abuse disorder: cessation counseling offered.     Diastolic CHF with preserved left ventricular function, NYHA class 4:echo 4/15 with EF 50% and features consistent with apseudonormal left ventricular filling pattern, with concomitant abnormal relaxation and increased fillingpressure (grade 2 diastolic dysfunction). Holding demadex for now.     COPD (chronic obstructive pulmonary disease): appears stable at baseline. Chart review indicates need for supplemental oxygen 24/7 but he has been refusing per PCP note.  Continue home inhalers.   Abnormal CT with concern for aspiration pneumonitis. Hx of pulmonary nodules and mediastinal adenopathy s/p bronchoscopy and transbronchial ultrasound guided ;ymph node biopsy no evidence of malignancy. Is followed by heme[facility]onc at [facility].   Dr Arnoldo Morale general surgery  Code Status: full DVT Prophylaxis: Family  Communication: none present Disposition Plan: home when ready  Time spent: 42 minutes  [facility] Pager [phone]

## 2014-02-10 NOTE — Consult Note (Signed)
Reason for Consult: Perforated appendicitis Referring Physician: ER  Theodore [name] is an 73 y.o. male.  HPI: patient is a 73 year old black male multiple medical problems including chronic atrial fibrillation on Coumadin who presented with a 24-hour history of worseninglower abdominal pain. CT scan of the abdomen revealed perforated appendicitis with contained abscess. Patient denies  nausea or vomiting. He has not taken his medications today. Past Medical History  Diagnosis Date  . Glaucoma     uses eye drops daily  . Allergic rhinitis     takes Singulair at bedtime  . Atrial fibrillation     takes Coumadin daily  . Diastolic heart failure     LVEF 86-76%, rate 2 diastolic dysfunction 03/9507  . Pulmonary hypertension     70 mmHg 03/2012  . Asthma     Albuterol neb and inhaler as needed  . Anxiety     takes Xanax daily as needed  . COPD (chronic obstructive pulmonary disease)     Symbicort daily  . Essential hypertension, benign     takes Metoprolol and Diltiazem daily  . Anemia     takes ferrous sulfate daily  . History of bronchitis   . Shortness of breath     with exertion  . Numbness     fingertips on both hands  . Lung nodules   . History of blood transfusion     Past Surgical History  Procedure Laterality Date  . Bilateral cataract surgery    . Herniorrhapy    . Tendon repair      Right hand surgical procedure for a tendon repair  . Colonoscopy N/A 11/29/2012    Procedure: COLONOSCOPY;  Surgeon: Danie Binder, MD;  [facility];  Service: Endoscopy;  Laterality: N/A;  1:00  . Esophagogastroduodenoscopy N/A 11/29/2012    Procedure: ESOPHAGOGASTRODUODENOSCOPY (EGD);  Surgeon: Danie Binder, MD;  [facility];  Service: Endoscopy;  Laterality: N/A;  . Eye surgery    . Hernia repair    . Radiology with anesthesia N/A 07/10/2013    Procedure: EMBOLIZATION-RADIOLOGY WITH ANESTHESIA;  Surgeon: Rob Hickman, MD;  Location: [place];  Service:  Radiology;  Laterality: N/A;  . Nose surgery      d/t nosebleeds  . Video bronchoscopy with endobronchial ultrasound N/A 11/21/2013    Procedure: VIDEO BRONCHOSCOPY WITH ENDOBRONCHIAL ULTRASOUND;  Surgeon: Melrose Nakayama, MD;  Location: [place];  Service: Thoracic;  Laterality: N/A;  . Mediastinoscopy N/A 11/21/2013    Procedure: MEDIASTINOSCOPY;  Surgeon: Melrose Nakayama, MD;  [facility];  Service: Thoracic;  Laterality: N/A;    Family History  Problem Relation Age of Onset  . Cancer Mother   . Cancer Father   . Coronary artery disease Brother   . Colon cancer Brother     Social History:  reports that he has been smoking Cigarettes.  He has a 30 pack-year smoking history. He has never used smokeless tobacco. He reports that he does not drink alcohol or use illicit drugs.  Allergies:  Allergies  Allergen Reactions  . Aspirin Other (See Comments)    On coumadin    Medications: I have reviewed the patient's current medications.  Results for orders placed or performed during the hospital encounter of 02/10/14 (from the past 48 hour(s))  CBC with Differential     Status: Abnormal   Collection Time: 02/10/14 11:06 AM  Result Value Ref Range   WBC 26.5 (H) 4.0 - 10.5 K/uL  RBC 3.76 (L) 4.22 - 5.81 MIL/uL   Hemoglobin 11.0 (L) 13.0 - 17.0 g/dL   HCT 34.3 (L) 39.0 - 52.0 %   MCV 91.2 78.0 - 100.0 fL   MCH 29.3 26.0 - 34.0 pg   MCHC 32.1 30.0 - 36.0 g/dL   RDW 16.4 (H) 11.5 - 15.5 %   Platelets 341 150 - 400 K/uL   Neutrophils Relative % 92 (H) 43 - 77 %   Neutro Abs 24.4 (H) 1.7 - 7.7 K/uL   Lymphocytes Relative 3 (L) 12 - 46 %   Lymphs Abs 0.7 0.7 - 4.0 K/uL   Monocytes Relative 5 3 - 12 %   Monocytes Absolute 1.4 (H) 0.1 - 1.0 K/uL   Eosinophils Relative 0 0 - 5 %   Eosinophils Absolute 0.0 0.0 - 0.7 K/uL   Basophils Relative 0 0 - 1 %   Basophils Absolute 0.0 0.0 - 0.1 K/uL  Comprehensive metabolic panel     Status: Abnormal   Collection Time: 02/10/14  11:06 AM  Result Value Ref Range   Sodium 138 137 - 147 mEq/L   Potassium 5.2 3.7 - 5.3 mEq/L   Chloride 93 (L) 96 - 112 mEq/L   CO2 30 19 - 32 mEq/L   Glucose, Bld 139 (H) 70 - 99 mg/dL   BUN 25 (H) 6 - 23 mg/dL   Creatinine, Ser 1.55 (H) 0.50 - 1.35 mg/dL   Calcium 8.9 8.4 - 10.5 mg/dL   Total Protein 8.0 6.0 - 8.3 g/dL   Albumin 3.0 (L) 3.5 - 5.2 g/dL   AST 18 0 - 37 U/L   ALT 6 0 - 53 U/L   Alkaline Phosphatase 127 (H) 39 - 117 U/L   Total Bilirubin 1.3 (H) 0.3 - 1.2 mg/dL   GFR calc non Af Amer 42 (L) >90 mL/min   GFR calc Af Amer 49 (L) >90 mL/min    Comment: (NOTE) The eGFR has been calculated using the CKD EPI equation. This calculation has not been validated in all clinical situations. eGFR's persistently <90 mL/min signify possible Chronic Kidney Disease.    Anion gap 15 5 - 15  Lipase, blood     Status: Abnormal   Collection Time: 02/10/14 11:06 AM  Result Value Ref Range   Lipase 9 (L) 11 - 59 U/L  Protime-INR     Status: Abnormal   Collection Time: 02/10/14 11:48 AM  Result Value Ref Range   Prothrombin Time 24.7 (H) 11.6 - 15.2 seconds   INR 2.21 (H) 0.00 - 1.49    Ct Abdomen Pelvis W Contrast  02/10/2014   CLINICAL DATA:  73 year old with left lower quadrant pain and tenderness  EXAM: CT ABDOMEN AND PELVIS WITH CONTRAST  TECHNIQUE: Multidetector CT imaging of the abdomen and pelvis was performed using the standard protocol following bolus administration of intravenous contrast.  CONTRAST:  75m OMNIPAQUE IOHEXOL 300 MG/ML SOLN, 1057mOMNIPAQUE IOHEXOL 300 MG/ML SOLN  COMPARISON:  None.  FINDINGS: Chest:The partially visualized chest demonstrates an enlarged heart, specifically the right atrium. Bronchiectatic changes are noted within the lung bases, associated areas of linear consolidation are also present in the lung bases. There is no pleural effusion.  Liver: No focal hepatic masses identified. There is no intrahepatic biliary ductal dilatation.  Gallbladder:  Unremarkable.  Spleen: Unremarkable.  Pancreas: Unremarkable.  Adrenal glands: Unremarkable.  Kidneys: There is no nephrolithiasis or hydronephrosis. No suspicious renal mass is seen.  Bowel/gastrointestinal tract: Diffuse inflammatory fat stranding  is noted within the right lower quadrant. A small rim enhancing fluid collection is also present within the right lower quadrant measuring approximately 4.5 x 3.2 cm (series 2, image 53). Tiny foci of free air are noted just anterior to this fluid collection within the right lower quadrant. An adjacent loop of small bowel with diffuse wall thickening and wall edema is present. The appendix is not identified. This constellation of finding is concerning for a a ruptured appendicitis.  Pelvis: There is no pelvic mass. The prostate gland is enlarged and measures approximately 5.0 x 4.0 cm.  Miscellaneous: Scattered vascular calcifications are present.  Osseous structures: Multilevel degenerative changes of the spine are noted.  IMPRESSION: 1. Findings are concerning for ruptured appendicitis with an associated approximately 4.5 x 3.2 cm abscess as detailed above. An adjacent loop of small bowel also demonstrates associated secondary enteritis. These results were called by telephone at the time of interpretation on 02/10/2014 at 1:25 pm to Dr. Milton Ferguson , who verbally acknowledged these results.  2. Bilateral lower lobe bronchiectatic changes and consolidation are suspicious for chronic aspiration pneumonitis.  3.  Cardiac enlargement, specifically involving the right atrium.   Electronically Signed   By: Rosemarie Ax   On: 02/10/2014 13:32    ROS: see chart Blood pressure 114/66, pulse 116, temperature 99.9 F (37.7 C), temperature source Oral, resp. rate 26, height '5\' 6"'  (1.676 m), weight 63.504 kg (140 lb), SpO2 100 %. Physical Exam: Pleasant black male in no acute distress. Abdomen is soft and flat with tenderness noted in the right lower quadrant to  palpation. No rigidity noted.  Impression: Perforated appendicitis, contained. Assessment/Plan: Patient does not need acute surgical intervention. The appendix is oriented ruptured. It is contained and he has no evidence of peritonitis. Will need admission and started on Zosyn. Due to his multiple comorbidities, he'll be admitted by the hospitalist. He will need percutaneous drainage of the abscess during this admission. Would hold Coumadin for now. Would start IV Zosyn. Will follow with you.   Elmire Amrein A 02/10/2014, 1:55 PM

## 2014-02-10 NOTE — Progress Notes (Signed)
ANTIBIOTIC CONSULT NOTE - INITIAL  Pharmacy Consult for Vancomycin and Zosyn Indication: sepsis, perforated appendix  Allergies  Allergen Reactions  . Aspirin Other (See Comments)    On coumadin   Patient Measurements: Height: 5\' 6"  (167.6 cm) Weight: 140 lb (63.504 kg) IBW/kg (Calculated) : 63.8  Vital Signs: Temp: 100.2 F (37.9 C) (12/01 1535) Temp Source: Oral (12/01 1535) BP: 103/61 mmHg (12/01 1535) Pulse Rate: 98 (12/01 1535) Intake/Output from previous day:   Intake/Output from this shift:    Labs:  Recent Labs  02/10/14 1106  WBC 26.5*  HGB 11.0*  PLT 341  CREATININE 1.55*   Estimated Creatinine Clearance: 37.6 mL/min (by C-G formula based on Cr of 1.55). No results for input(s): VANCOTROUGH, VANCOPEAK, VANCORANDOM, GENTTROUGH, GENTPEAK, GENTRANDOM, TOBRATROUGH, TOBRAPEAK, TOBRARND, AMIKACINPEAK, AMIKACINTROU, AMIKACIN in the last 72 hours.   Microbiology: No results found for this or any previous visit (from the past 720 hour(s)).  Medical History: Past Medical History  Diagnosis Date  . Glaucoma     uses eye drops daily  . Allergic rhinitis     takes Singulair at bedtime  . Atrial fibrillation     takes Coumadin daily  . Diastolic heart failure     LVEF 78-24%, rate 2 diastolic dysfunction 04/3534  . Pulmonary hypertension     70 mmHg 03/2012  . Asthma     Albuterol neb and inhaler as needed  . Anxiety     takes Xanax daily as needed  . COPD (chronic obstructive pulmonary disease)     Symbicort daily  . Essential hypertension, benign     takes Metoprolol and Diltiazem daily  . Anemia     takes ferrous sulfate daily  . History of bronchitis   . Shortness of breath     with exertion  . Numbness     fingertips on both hands  . Lung nodules   . History of blood transfusion    Anti-infectives    Start     Dose/Rate Route Frequency Ordered Stop   02/11/14 1700  vancomycin (VANCOCIN) IVPB 1000 mg/200 mL premix     1,000 mg200 mL/hr over 60  Minutes Intravenous Every 24 hours 02/10/14 1607     02/10/14 2200  piperacillin-tazobactam (ZOSYN) IVPB 3.375 g     3.375 g12.5 mL/hr over 240 Minutes Intravenous Every 8 hours 02/10/14 1602     02/10/14 1700  vancomycin (VANCOCIN) 1,250 mg in sodium chloride 0.9 % 250 mL IVPB     1,250 mg166.7 mL/hr over 90 Minutes Intravenous  Once 02/10/14 1605     02/10/14 1345  piperacillin-tazobactam (ZOSYN) IVPB 3.375 g     3.375 g100 mL/hr over 30 Minutes Intravenous  Once 02/10/14 1344 02/10/14 1452     Assessment: 73yo male presented to ED with c/o abdominal pain.  Initial evaluation reveals a ruptured appendix with associated abscess and acute renal failure.  Estimated Creatinine Clearance: 37.6 mL/min (by C-G formula based on Cr of 1.55).  Goal of Therapy:  Vancomycin trough level 15-20 mcg/ml Eradicate infection.  Plan: Vancomycin 1250mg  IV now x 1 dose then  Vancomycin 1000mg  IV q24hrs (renally adjusted) Check trough at steady state Zosyn 3.375gm IV q8h, each dose over 4 hrs Monitor labs, renal fxn, progress, cultures, and LOT  Hart Robinsons A 02/10/2014,4:09 PM

## 2014-02-10 NOTE — Progress Notes (Signed)
Pt. Currently not on SCD's d/t no tubing being available for the SCD machine. All soiled utility has been searched and no SCD's present. AC notified and will bring tubing up when found

## 2014-02-10 NOTE — Plan of Care (Signed)
Problem: Phase I Progression Outcomes Goal: Voiding-avoid urinary catheter unless indicated Outcome: Completed/Met Date Met:  02/10/14 Using urinal

## 2014-02-10 NOTE — ED Provider Notes (Signed)
CSN: [id number]     Arrival date & time 02/10/14  1012 History  This chart was scribed for Maudry Diego, MD by Rayfield Citizen, ED Scribe. This patient was seen in room APA16A/APA16A and the patient's care was started at 10:34 AM.     Chief Complaint  Patient presents with  . Abdominal Pain   Patient is a 73 y.o. male presenting with abdominal pain. The history is provided by the patient (pt complains of RLQ abdominal pain). No language interpreter was used.  Abdominal Pain Pain location:  RLQ Pain radiates to:  Does not radiate Pain severity:  Moderate Onset quality:  Gradual Duration:  1 day Timing:  Constant Progression:  Unchanged Chronicity:  New Relieved by:  None tried Worsened by:  Nothing tried Ineffective treatments:  None tried Associated symptoms: chills   Associated symptoms: no chest pain, no constipation, no cough, no diarrhea, no fatigue, no fever and no hematuria   Risk factors: being elderly   Risk factors: has not had multiple surgeries      HPI Comments: Theodore [name] is a 73 y.o. male who presents to the Emergency Department complaining of RLQ pain beginning yesterday. He rates his pain as a 7-8/10 at present, worsening with movement. He reports chills and dizzy spells. He notes that his last BM was yesterday, described as normal. He denies fevers, cough.   He denies any abdominal surgical history.   Past Medical History  Diagnosis Date  . Glaucoma     uses eye drops daily  . Allergic rhinitis     takes Singulair at bedtime  . Atrial fibrillation     takes Coumadin daily  . Diastolic heart failure     LVEF 14-48%, rate 2 diastolic dysfunction 03/8561  . Pulmonary hypertension     70 mmHg 03/2012  . Asthma     Albuterol neb and inhaler as needed  . Anxiety     takes Xanax daily as needed  . COPD (chronic obstructive pulmonary disease)     Symbicort daily  . Essential hypertension, benign     takes Metoprolol and Diltiazem daily  . Anemia    takes ferrous sulfate daily  . History of bronchitis   . Shortness of breath     with exertion  . Numbness     fingertips on both hands  . Lung nodules   . History of blood transfusion    Past Surgical History  Procedure Laterality Date  . Bilateral cataract surgery    . Herniorrhapy    . Tendon repair      Right hand surgical procedure for a tendon repair  . Colonoscopy N/A 11/29/2012    Procedure: COLONOSCOPY;  Surgeon: Danie Binder, MD;  Location: [facility];  Service: Endoscopy;  Laterality: N/A;  1:00  . Esophagogastroduodenoscopy N/A 11/29/2012    Procedure: ESOPHAGOGASTRODUODENOSCOPY (EGD);  Surgeon: Danie Binder, MD;  Location: [facility];  Service: Endoscopy;  Laterality: N/A;  . Eye surgery    . Hernia repair    . Radiology with anesthesia N/A 07/10/2013    Procedure: EMBOLIZATION-RADIOLOGY WITH ANESTHESIA;  Surgeon: Rob Hickman, MD;  Location: [place];  Service: Radiology;  Laterality: N/A;  . Nose surgery      d/t nosebleeds  . Video bronchoscopy with endobronchial ultrasound N/A 11/21/2013    Procedure: VIDEO BRONCHOSCOPY WITH ENDOBRONCHIAL ULTRASOUND;  Surgeon: Melrose Nakayama, MD;  Location: [place];  Service: Thoracic;  Laterality: N/A;  .  Mediastinoscopy N/A 11/21/2013    Procedure: MEDIASTINOSCOPY;  Surgeon: Melrose Nakayama, MD;  Location: [facility];  Service: Thoracic;  Laterality: N/A;   Family History  Problem Relation Age of Onset  . Cancer Mother   . Cancer Father   . Coronary artery disease Brother   . Colon cancer Brother    History  Substance Use Topics  . Smoking status: Current Some Day Smoker -- 0.50 packs/day for 60 years    Types: Cigarettes  . Smokeless tobacco: Never Used  . Alcohol Use: No    Review of Systems  Constitutional: Positive for chills. Negative for fever, appetite change and fatigue.  HENT: Negative for congestion, ear discharge and sinus pressure.   Eyes: Negative for discharge.  Respiratory: Negative for  cough.   Cardiovascular: Negative for chest pain.  Gastrointestinal: Positive for abdominal pain. Negative for diarrhea and constipation.  Genitourinary: Negative for frequency and hematuria.  Musculoskeletal: Negative for back pain.  Skin: Negative for rash.  Neurological: Positive for dizziness. Negative for seizures and headaches.  Psychiatric/Behavioral: Negative for hallucinations.    Allergies  Aspirin  Home Medications   Prior to Admission medications   Medication Sig Start Date End Date Taking? Authorizing Provider  albuterol (PROAIR HFA) 108 (90 BASE) MCG/ACT inhaler Inhale 2 puffs into the lungs every 6 (six) hours as needed for wheezing or shortness of breath. 05/23/13   Fayrene Helper, MD  albuterol (PROVENTIL) (2.5 MG/3ML) 0.083% nebulizer solution Take 2.5 mg by nebulization every 8 (eight) hours as needed for wheezing or shortness of breath.    Historical Provider, MD  albuterol (PROVENTIL) (2.5 MG/3ML) 0.083% nebulizer solution INHALE THE CONTENTS OF 1 VIAL USING THE NEBULIZER EVERY 6 HOURS AS NEEDED 01/13/14   Fayrene Helper, MD  allopurinol (ZYLOPRIM) 300 MG tablet Take 1 tablet (300 mg total) by mouth daily. 05/23/13   Fayrene Helper, MD  ALPRAZolam Duanne Moron) 0.25 MG tablet Take 0.25 mg by mouth daily as needed for anxiety.    Historical Provider, MD  brimonidine-timolol (COMBIGAN) 0.2-0.5 % ophthalmic solution Place 1 drop into both eyes every 12 (twelve) hours.    Historical Provider, MD  budesonide-formoterol (SYMBICORT) 160-4.5 MCG/ACT inhaler Inhale 2 puffs into the lungs 2 (two) times daily. 05/23/13   Fayrene Helper, MD  diltiazem (CARDIZEM CD) 180 MG 24 hr capsule Take 1 capsule (180 mg total) by mouth daily. 09/22/13   Herminio Commons, MD  mirtazapine (REMERON) 15 MG tablet Take 1 tablet (15 mg total) by mouth at bedtime. 12/30/13   Fayrene Helper, MD  montelukast (SINGULAIR) 10 MG tablet Take 10 mg by mouth at bedtime.    Historical Provider, MD   potassium chloride (K-DUR) 10 MEQ tablet Take 10 mEq by mouth 2 (two) times daily.  06/13/13   Tanna Furry, MD  tiotropium (SPIRIVA) 18 MCG inhalation capsule Place 18 mcg into inhaler and inhale daily.    Historical Provider, MD  torsemide (DEMADEX) 20 MG tablet Take 2 tablets (40 mg total) by mouth daily. 10/02/13   Herminio Commons, MD  traMADol (ULTRAM) 50 MG tablet TAKE 1 TABLET BY MOUTH EVERY DAY AS NEEDED 11/19/13   Fayrene Helper, MD  travoprost, benzalkonium, (TRAVATAN) 0.004 % ophthalmic solution Place 1 drop into both eyes at bedtime.    Historical Provider, MD  traZODone (DESYREL) 50 MG tablet Take 0.5-1 tablets (25-50 mg total) by mouth at bedtime as needed for sleep. 03/13/14   Fayrene Helper, MD  warfarin (COUMADIN) 4 MG tablet Take 1.5 tabs (6 mg) on Monday and Thursday and 1 tab (4mg ) on other days 02/09/14   Herminio Commons, MD   There were no vitals taken for this visit. Physical Exam  Constitutional: He is oriented to person, place, and time. He appears well-developed.  HENT:  Head: Normocephalic.  Dry mucous membranes  Eyes: Conjunctivae and EOM are normal. No scleral icterus.  Neck: Neck supple. No thyromegaly present.  Cardiovascular: Normal rate and regular rhythm.  Exam reveals no gallop and no friction rub.   No murmur heard. Pulmonary/Chest: No stridor. He has no wheezes. He has no rales. He exhibits no tenderness.  Abdominal: He exhibits no distension. There is tenderness (RLQ). There is no rebound.  Musculoskeletal: Normal range of motion. He exhibits no edema.  Lymphadenopathy:    He has no cervical adenopathy.  Neurological: He is oriented to person, place, and time. He exhibits normal muscle tone. Coordination normal.  Skin: No rash noted. No erythema.  Psychiatric: He has a normal mood and affect. His behavior is normal.    ED Course  Procedures   DIAGNOSTIC STUDIES:   COORDINATION OF CARE: 10:44 AM Discussed treatment plan with pt at  bedside and pt agreed to plan.   Labs Review Labs Reviewed - No data to display  Imaging Review No results found.   EKG Interpretation None     CRITICAL CARE Performed by: Abryana Lykens L Total critical care time: 40 Critical care time was exclusive of separately billable procedures and treating other patients. Critical care was necessary to treat or prevent imminent or life-threatening deterioration. Critical care was time spent personally by me on the following activities: development of treatment plan with patient and/or surrogate as well as nursing, discussions with consultants, evaluation of patient's response to treatment, examination of patient, obtaining history from patient or surrogate, ordering and performing treatments and interventions, ordering and review of laboratory studies, ordering and review of radiographic studies, pulse oximetry and re-evaluation of patient's condition.   MDM   Final diagnoses:  None    Appendicitis,  Admit   The chart was scribed for me under my direct supervision.  I personally performed the history, physical, and medical decision making and all procedures in the evaluation of this patient.Maudry Diego, MD 02/10/14 [phone]

## 2014-02-10 NOTE — Progress Notes (Signed)
SCD's and tubing now present and connected to pt

## 2014-02-10 NOTE — Progress Notes (Addendum)
Patient ID: Theodore [name], male   DOB: 1940-06-29, 73 y.o.   MRN: [id number] Request received for CT guided drainage of RLQ abscess in pt. Hx noted, labs checked- PT 24.7/INR 2.21 today (on coumadin as OP- AFIB) ; imaging studies were reviewed by Dr. Kathlene Cote. The abscess under question has a very small fluid component and is not well formed with closely adjacent colon. Recommend continued antibiotic treatment and f/u CT on 12/3 or 12/4. INR will need to be at least below 1.7 if and when drain placed. Will cont to monitor.

## 2014-02-10 NOTE — ED Notes (Signed)
Dr. Jenkins at bedside. 

## 2014-02-10 NOTE — ED Notes (Signed)
Pt states he starting having abdominal pain staring last night. Pt complains of LLQ pain and tenderness. Pt sates he has intermittent dizziness.

## 2014-02-10 NOTE — Care Management Utilization Note (Signed)
UR complete 

## 2014-02-11 ENCOUNTER — Inpatient Hospital Stay (HOSPITAL_COMMUNITY): Payer: Medicare Other

## 2014-02-11 DIAGNOSIS — R140 Abdominal distension (gaseous): Secondary | ICD-10-CM

## 2014-02-11 LAB — COMPREHENSIVE METABOLIC PANEL
ALT: 6 U/L (ref 0–53)
AST: 15 U/L (ref 0–37)
Albumin: 2.6 g/dL — ABNORMAL LOW (ref 3.5–5.2)
Alkaline Phosphatase: 98 U/L (ref 39–117)
Anion gap: 10 (ref 5–15)
BUN: 29 mg/dL — ABNORMAL HIGH (ref 6–23)
CO2: 34 meq/L — AB (ref 19–32)
Calcium: 9 mg/dL (ref 8.4–10.5)
Chloride: 94 mEq/L — ABNORMAL LOW (ref 96–112)
Creatinine, Ser: 1.7 mg/dL — ABNORMAL HIGH (ref 0.50–1.35)
GFR calc Af Amer: 44 mL/min — ABNORMAL LOW (ref >90)
GFR calc non Af Amer: 38 mL/min — ABNORMAL LOW (ref >90)
GLUCOSE: 130 mg/dL — AB (ref 70–99)
POTASSIUM: 4.4 meq/L (ref 3.7–5.3)
SODIUM: 138 meq/L (ref 137–147)
TOTAL PROTEIN: 7.2 g/dL (ref 6.0–8.3)
Total Bilirubin: 1 mg/dL (ref 0.3–1.2)

## 2014-02-11 LAB — PROTIME-INR
INR: 2.51 — AB (ref 0.00–1.49)
Prothrombin Time: 27.3 seconds — ABNORMAL HIGH (ref 11.6–15.2)

## 2014-02-11 LAB — TSH: TSH: 2.61 u[IU]/mL (ref 0.350–4.500)

## 2014-02-11 MED ORDER — FUROSEMIDE 10 MG/ML IJ SOLN
40.0000 mg | Freq: Once | INTRAMUSCULAR | Status: AC
  Administered 2014-02-11: 40 mg via INTRAVENOUS
  Filled 2014-02-11: qty 4

## 2014-02-11 MED ORDER — ALBUTEROL SULFATE (2.5 MG/3ML) 0.083% IN NEBU
2.5000 mg | INHALATION_SOLUTION | Freq: Four times a day (QID) | RESPIRATORY_TRACT | Status: DC
  Administered 2014-02-11 – 2014-02-12 (×3): 2.5 mg via RESPIRATORY_TRACT
  Filled 2014-02-11 (×2): qty 3

## 2014-02-11 MED ORDER — CETYLPYRIDINIUM CHLORIDE 0.05 % MT LIQD
7.0000 mL | Freq: Two times a day (BID) | OROMUCOSAL | Status: DC
  Administered 2014-02-11 – 2014-02-25 (×25): 7 mL via OROMUCOSAL

## 2014-02-11 NOTE — Progress Notes (Signed)
[facility] PROGRESS NOTE  Theodore [name] MVE:[id number] DOB: 09/21/1940 DOA: 02/10/2014 PCP: Tula Nakayama, MD  Assessment/Plan:  Acute on chronic respiratory failue : likely related to acute on chronic diastolic heart failure in setting of COPD. Chest xray with no infiltrate or effusion and mild vascular congestion. Oxygen saturation level 88% on RA improved to 100% on 3L. His home demadex on hold. Will provide IV lasix and stop IV fluid. Continue oxygen supplement monitor closely  Appendicitis perforation with contained abscess. He was evaluated by general surgery who opines no peritonitis and no surgical intervention.Evaluated by radilogy who opine very small fluid component and not well formed. Recommended continued antibiotic and repeat CT. holding  coumadin.  zosyn day #2.  Active Problems:  ARF (acute renal failure): creatinine trending up. Have started lasix.  likely related to dehydration secondary to decreased po intake. No hx same. Continue to hold nephrotoxins.Recheck in am. Monitor intake and output  Leukocytosis: secondary to #1. Max temp 98.3. Blood cultures no growth. Urinalysis unremarkable.  Chest xray some concern for aspiration pneumonitis. Will continue zosyn and monitor  Atrial fibrillation: rate controled. TSH 2.6.     Essential hypertension: fair control.    Tobacco abuse disorder: cessation counseling offered.    Diastolic CHF with preserved left ventricular function, NYHA class 4:echo 4/15 with EF 50% and features consistent with apseudonormal left ventricular filling pattern, with concomitant abnormal relaxation and increased fillingpressure (grade 2 diastolic dysfunction). See #1.     COPD (chronic obstructive pulmonary disease): remains stable at baseline. Chart review indicates need for supplemental oxygen 24/7 but he has been refusing per PCP note. Continue home inhalers.   Abnormal CT with concern for aspiration pneumonitis. Hx of  pulmonary nodules and mediastinal adenopathy s/p bronchoscopy and transbronchial ultrasound guided ;ymph node biopsy no evidence of malignancy. Is followed by heme[facility]onc at [facility]. Will continue vanc day #2.   Code Status: full Family Communication: none present Disposition Plan: home when ready   Consultants:  Dr Arnoldo Morale  Procedures:  none  Antibiotics:   zosyn 02/10/14>>  Vancomycin 02/10/14>>  HPI/Subjective: Reports worsening sob. Denies worsening abdominal pain.   Objective: Filed Vitals:   02/11/14 1342  BP:   Pulse: 81  Temp:   Resp:     Intake/Output Summary (Last 24 hours) at 02/11/14 1410 Last data filed at 02/11/14 1310  Gross per 24 hour  Intake 2212.5 ml  Output    275 ml  Net 1937.5 ml   Filed Weights   02/10/14 1035 02/10/14 1535 02/11/14 0548  Weight: 63.504 kg (140 lb) 63.504 kg (140 lb) 63.594 kg (140 lb 3.2 oz)    Exam:   General:  Appears somewhat uncomfortable  Cardiovascular: RRR No MGR No LE edema   Respiratory: mild to moderate increased work of breathing with conversation. BS diminished throughout particularly on right. No wheeze or crackles  Abdomen: distended and somewhat firm. Mild diffuse tenderness to palpation. Very sluggish BS  Musculoskeletal: joints without swelling/erythema   Data Reviewed: Basic Metabolic Panel:  Recent Labs Lab 02/10/14 1106 02/10/14 1116 02/11/14 0554  NA 138 136* 138  K 5.2 4.8 4.4  CL 93* 94* 94*  CO2 30  --  34*  GLUCOSE 139* 132* 130*  BUN 25* 32* 29*  CREATININE 1.55* 1.60* 1.70*  CALCIUM 8.9  --  9.0   Liver Function Tests:  Recent Labs Lab 02/10/14 1106 02/11/14 0554  AST 18 15  ALT 6 6  ALKPHOS 127* 98  BILITOT 1.3* 1.0  PROT 8.0 7.2  ALBUMIN 3.0* 2.6*    Recent Labs Lab 02/10/14 1106  LIPASE 9*   No results for input(s): AMMONIA in the last 168 hours. CBC:  Recent Labs Lab 02/10/14 1106 02/10/14 1116  WBC 26.5*  --   NEUTROABS 24.4*  --   HGB 11.0*  12.9*  HCT 34.3* 38.0*  MCV 91.2  --   PLT 341  --    Cardiac Enzymes: No results for input(s): CKTOTAL, CKMB, CKMBINDEX, TROPONINI in the last 168 hours. BNP (last 3 results)  Recent Labs  06/16/13 1554 06/17/13 0537 07/09/13 0555  PROBNP 3560.0* 3222.0* 3842.0*   CBG: No results for input(s): GLUCAP in the last 168 hours.  Recent Results (from the past 240 hour(s))  Blood culture (routine x 2)     Status: None (Preliminary result)   Collection Time: 02/10/14  1:50 PM  Result Value Ref Range Status   Specimen Description BLOOD RIGHT ANTECUBITAL  Final   Special Requests BOTTLES DRAWN AEROBIC AND ANAEROBIC 6CC  Final   Culture NO GROWTH 1 DAY  Final   Report Status PENDING  Incomplete  Blood culture (routine x 2)     Status: None (Preliminary result)   Collection Time: 02/10/14  2:10 PM  Result Value Ref Range Status   Specimen Description BLOOD RIGHT ARM  Final   Special Requests   Final    BOTTLES DRAWN AEROBIC AND ANAEROBIC AEB=8CC ANA=6CC   Culture NO GROWTH 1 DAY  Final   Report Status PENDING  Incomplete     Studies: Ct Abdomen Pelvis W Contrast  02/10/2014   CLINICAL DATA:  73 year old with left lower quadrant pain and tenderness  EXAM: CT ABDOMEN AND PELVIS WITH CONTRAST  TECHNIQUE: Multidetector CT imaging of the abdomen and pelvis was performed using the standard protocol following bolus administration of intravenous contrast.  CONTRAST:  40mL OMNIPAQUE IOHEXOL 300 MG/ML SOLN, 187mL OMNIPAQUE IOHEXOL 300 MG/ML SOLN  COMPARISON:  None.  FINDINGS: Chest:The partially visualized chest demonstrates an enlarged heart, specifically the right atrium. Bronchiectatic changes are noted within the lung bases, associated areas of linear consolidation are also present in the lung bases. There is no pleural effusion.  Liver: No focal hepatic masses identified. There is no intrahepatic biliary ductal dilatation.  Gallbladder: Unremarkable.  Spleen: Unremarkable.  Pancreas:  Unremarkable.  Adrenal glands: Unremarkable.  Kidneys: There is no nephrolithiasis or hydronephrosis. No suspicious renal mass is seen.  Bowel/gastrointestinal tract: Diffuse inflammatory fat stranding is noted within the right lower quadrant. A small rim enhancing fluid collection is also present within the right lower quadrant measuring approximately 4.5 x 3.2 cm (series 2, image 53). Tiny foci of free air are noted just anterior to this fluid collection within the right lower quadrant. An adjacent loop of small bowel with diffuse wall thickening and wall edema is present. The appendix is not identified. This constellation of finding is concerning for a a ruptured appendicitis.  Pelvis: There is no pelvic mass. The prostate gland is enlarged and measures approximately 5.0 x 4.0 cm.  Miscellaneous: Scattered vascular calcifications are present.  Osseous structures: Multilevel degenerative changes of the spine are noted.  IMPRESSION: 1. Findings are concerning for ruptured appendicitis with an associated approximately 4.5 x 3.2 cm abscess as detailed above. An adjacent loop of small bowel also demonstrates associated secondary enteritis. These results were called by telephone at the time of interpretation on 02/10/2014 at 1:25 pm to Dr. Broadus John ZAMMIT ,  who verbally acknowledged these results.  2. Bilateral lower lobe bronchiectatic changes and consolidation are suspicious for chronic aspiration pneumonitis.  3.  Cardiac enlargement, specifically involving the right atrium.   Electronically Signed   By: Rosemarie Ax   On: 02/10/2014 13:32   Dg Chest Port 1 View  02/11/2014   CLINICAL DATA:  Under show evaluation for dyspnea, history of hypertension asthma and bronchitis  EXAM: PORTABLE CHEST - 1 VIEW  COMPARISON:  11/19/2013  FINDINGS: Severe cardiac enlargement. Mild vascular congestion. No evidence of edema infiltrate or consolidation. No pleural effusion.  IMPRESSION: Stable severe cardiac enlargement    Electronically Signed   By: Skipper Cliche M.D.   On: 02/11/2014 13:42    Scheduled Meds: . allopurinol  300 mg Oral Daily  . brimonidine  1 drop Both Eyes Q12H   And  . timolol  1 drop Both Eyes Q12H  . budesonide-formoterol  2 puff Inhalation BID  . diltiazem  180 mg Oral Daily  . latanoprost  1 drop Both Eyes QHS  . montelukast  10 mg Oral QHS  . piperacillin-tazobactam (ZOSYN)  IV  3.375 g Intravenous Q8H  . sodium chloride  3 mL Intravenous Q12H  . tiotropium  18 mcg Inhalation Daily  . vancomycin  1,000 mg Intravenous Q24H   Continuous Infusions: . sodium chloride 10 mL/hr at 02/11/14 1310    Principal Problem:   Appendicitis Active Problems:   Essential hypertension   Atrial fibrillation   Tobacco abuse disorder   Diastolic CHF with preserved left ventricular function, NYHA class 4   COPD (chronic obstructive pulmonary disease)   Leukocytosis   ARF (acute renal failure)   Enteritis   Abscess   Acute perforated appendicitis   Appendicitis with abscess    Time spent: 40 minutes    [facility] Pager [phone]. If 7PM-7AM, please contact night-coverage at [email or web address], password [facility] 02/11/2014, 2:10 PM  LOS: 1 day

## 2014-02-11 NOTE — Progress Notes (Signed)
Subjective: Still with right lower quadrant abdominal tenderness. Patient states he is about the same as yesterday.  Objective: Vital signs in last 24 hours: Temp:  [98.7 F (37.1 C)-100.2 F (37.9 C)] 98.7 F (37.1 C) (12/02 0548) Pulse Rate:  [81-116] 83 (12/02 0548) Resp:  [20-30] 22 (12/02 0548) BP: (93-144)/(58-75) 93/61 mmHg (12/02 0548) SpO2:  [85 %-100 %] 97 % (12/02 0714) Weight:  [63.504 kg (140 lb)-63.594 kg (140 lb 3.2 oz)] 63.594 kg (140 lb 3.2 oz) (12/02 0548) Last BM Date: 02/10/14  Intake/Output from previous day: 12/01 0701 - 12/02 0700 In: 430 [P.O.:240; I.V.:190] Out: 150 [Urine:150] Intake/Output this shift: Total I/O In: -  Out: 125 [Urine:125]  General appearance: alert, cooperative and no distress GI: Soft with tenderness to palpation the right lower quadrant. No diffuse tenderness or rigidity are noted. Mildly distended.  Lab Results:   Recent Labs  02/10/14 1106 02/10/14 1116  WBC 26.5*  --   HGB 11.0* 12.9*  HCT 34.3* 38.0*  PLT 341  --    BMET  Recent Labs  02/10/14 1106 02/10/14 1116 02/11/14 0554  NA 138 136* 138  K 5.2 4.8 4.4  CL 93* 94* 94*  CO2 30  --  34*  GLUCOSE 139* 132* 130*  BUN 25* 32* 29*  CREATININE 1.55* 1.60* 1.70*  CALCIUM 8.9  --  9.0   PT/INR  Recent Labs  02/10/14 1148 02/11/14 0554  LABPROT 24.7* 27.3*  INR 2.21* 2.51*    Studies/Results: Ct Abdomen Pelvis W Contrast  02/10/2014   CLINICAL DATA:  73 year old with left lower quadrant pain and tenderness  EXAM: CT ABDOMEN AND PELVIS WITH CONTRAST  TECHNIQUE: Multidetector CT imaging of the abdomen and pelvis was performed using the standard protocol following bolus administration of intravenous contrast.  CONTRAST:  17mL OMNIPAQUE IOHEXOL 300 MG/ML SOLN, 162mL OMNIPAQUE IOHEXOL 300 MG/ML SOLN  COMPARISON:  None.  FINDINGS: Chest:The partially visualized chest demonstrates an enlarged heart, specifically the right atrium. Bronchiectatic changes are  noted within the lung bases, associated areas of linear consolidation are also present in the lung bases. There is no pleural effusion.  Liver: No focal hepatic masses identified. There is no intrahepatic biliary ductal dilatation.  Gallbladder: Unremarkable.  Spleen: Unremarkable.  Pancreas: Unremarkable.  Adrenal glands: Unremarkable.  Kidneys: There is no nephrolithiasis or hydronephrosis. No suspicious renal mass is seen.  Bowel/gastrointestinal tract: Diffuse inflammatory fat stranding is noted within the right lower quadrant. A small rim enhancing fluid collection is also present within the right lower quadrant measuring approximately 4.5 x 3.2 cm (series 2, image 53). Tiny foci of free air are noted just anterior to this fluid collection within the right lower quadrant. An adjacent loop of small bowel with diffuse wall thickening and wall edema is present. The appendix is not identified. This constellation of finding is concerning for a a ruptured appendicitis.  Pelvis: There is no pelvic mass. The prostate gland is enlarged and measures approximately 5.0 x 4.0 cm.  Miscellaneous: Scattered vascular calcifications are present.  Osseous structures: Multilevel degenerative changes of the spine are noted.  IMPRESSION: 1. Findings are concerning for ruptured appendicitis with an associated approximately 4.5 x 3.2 cm abscess as detailed above. An adjacent loop of small bowel also demonstrates associated secondary enteritis. These results were called by telephone at the time of interpretation on 02/10/2014 at 1:25 pm to Dr. Milton Ferguson , who verbally acknowledged these results.  2. Bilateral lower lobe bronchiectatic changes and consolidation are  suspicious for chronic aspiration pneumonitis.  3.  Cardiac enlargement, specifically involving the right atrium.   Electronically Signed   By: Rosemarie Ax   On: 02/10/2014 13:32    Anti-infectives: Anti-infectives    Start     Dose/Rate Route Frequency Ordered  Stop   02/11/14 1700  vancomycin (VANCOCIN) IVPB 1000 mg/200 mL premix     1,000 mg200 mL/hr over 60 Minutes Intravenous Every 24 hours 02/10/14 1607     02/10/14 2200  piperacillin-tazobactam (ZOSYN) IVPB 3.375 g     3.375 g12.5 mL/hr over 240 Minutes Intravenous Every 8 hours 02/10/14 1602     02/10/14 1700  vancomycin (VANCOCIN) 1,250 mg in sodium chloride 0.9 % 250 mL IVPB     1,250 mg166.7 mL/hr over 90 Minutes Intravenous  Once 02/10/14 1605 02/10/14 1934   02/10/14 1345  piperacillin-tazobactam (ZOSYN) IVPB 3.375 g     3.375 g100 mL/hr over 30 Minutes Intravenous  Once 02/10/14 1344 02/10/14 1452      Assessment/Plan: Impression: Perforated appendicitis, INR still elevated. Awaiting interventional radiologic drainage. No need for acute surgical intervention.  LOS: 1 day    Aarianna Hoadley A 02/11/2014

## 2014-02-11 NOTE — Progress Notes (Signed)
Patient being transferred to [facility], 6N Room 8.  Report given to Barrett Henle, RN on receiving unit.  All questions answered at this time.  Patient left unit in stable condition with 2 bags of belongings on a stretcher with [facility].

## 2014-02-11 NOTE — Progress Notes (Signed)
Radiology re-reviewed CT (Dr Darcella Cheshire) notified Dr Arnoldo Morale (general surgery) of multiple fluid collections/abscess and terminal ileum twisted with possible necrosis. Dr Arnoldo Morale recommending transfer to [facility].  Have spoken to Dr Daleen Bo [facility] team 6. Have spoken to Saverio Danker PA surgery who requested MD be paged on patients arrival. [phone].  Have spoken to patient and answered all his questions.         Radene Gunning, NP

## 2014-02-11 NOTE — Progress Notes (Signed)
Patient reporting having difficulty breathing, respiratory notified and breathing treatment given, BP 146/112, P 112 on monitor, R 32 with abdominal breathing, lungs sound diminished with expiratory wheezing, Dr. Jerilee Hoh notified, will continue to monitor.

## 2014-02-11 NOTE — Care Management Note (Signed)
  Page 2 of 2   02/25/2014     10:23:14 AM CARE MANAGEMENT NOTE 02/25/2014  Patient:  Theodore [name], Theodore [name]   Account Number:  [id number]  Date Initiated:  02/11/2014  Documentation initiated by:  CHILDRESS,JESSICA  Subjective/Objective Assessment:   Pt is from home with self care. Pt says he is active with [facility]. Pt has home O2 through [facility]. Pt has no medication needs.     Action/Plan:   Plan to discharge home with self care. Will contact [facility] RN to alert her of admission. No CM needs identified at this time. Will continue to follow.   Anticipated DC Date:  02/26/2014   Anticipated DC Plan:  [place]  CM consult      Choice offered to / List presented to:          [facility] arranged  HH-1 RN  [place].   Status of service:  In process, will continue to follow Medicare Important Message given?  YES (If response is "NO", the following Medicare IM given date fields will be blank) Date Medicare IM given:  02/23/2014 Medicare IM given by:  Magdalen Spatz Date Additional Medicare IM given:  02/25/2014 Additional Medicare IM given by:  Magdalen Spatz  Discharge Disposition:  [place]  Per UR Regulation:  Reviewed for med. necessity/level of care/duration of stay  If discussed at [place] of Stay Meetings, dates discussed:   02/17/2014  02/18/2014  02/24/2014    Comments:  02-23-14 Confirmed face sheet information with patient . Patient states he has 24 hour supervision provided by his son .  Explained PT recommended a cane . Patient does not feel like he needs a cane at this time. Magdalen Spatz RN BSN    02/11/2014 [place], RN, MSN, [employer]

## 2014-02-11 NOTE — Progress Notes (Signed)
NP called surgeon on call, Dr. Brantley Stage, to make him aware pt has arrived from [facility]. Per surgery PA note today, we were to call upon pt's arrival.  Clance Boll, NP [facility]

## 2014-02-11 NOTE — Consult Note (Signed)
Reason for Consult:perforated appendicitis Referring Physician: Aviva Signs MD  Theodore [name] is an 73 y.o. male.  HPI: pt sent from [facility] due to perforated appendix and hypocoagulable state. Seen by Dr Arnoldo Morale at [facility] and felt that non operative management best.  Pt states his RLQ abdominal pain started Monday and has been about the same.  Sharp RLQ made worse when he moves around.  CT shows perforated appendix and phlegmon.  Some SOB and hx of CHF.   Past Medical History  Diagnosis Date  . Glaucoma     uses eye drops daily  . Allergic rhinitis     takes Singulair at bedtime  . Atrial fibrillation     takes Coumadin daily  . Diastolic heart failure     LVEF 75-17%, rate 2 diastolic dysfunction 0/0174  . Pulmonary hypertension     70 mmHg 03/2012  . Asthma     Albuterol neb and inhaler as needed  . Anxiety     takes Xanax daily as needed  . COPD (chronic obstructive pulmonary disease)     Symbicort daily  . Essential hypertension, benign     takes Metoprolol and Diltiazem daily  . Anemia     takes ferrous sulfate daily  . History of bronchitis   . Shortness of breath     with exertion  . Numbness     fingertips on both hands  . Lung nodules   . History of blood transfusion     Past Surgical History  Procedure Laterality Date  . Bilateral cataract surgery    . Herniorrhapy    . Tendon repair      Right hand surgical procedure for a tendon repair  . Colonoscopy N/A 11/29/2012    Procedure: COLONOSCOPY;  Surgeon: Danie Binder, MD;  [facility];  Service: Endoscopy;  Laterality: N/A;  1:00  . Esophagogastroduodenoscopy N/A 11/29/2012    Procedure: ESOPHAGOGASTRODUODENOSCOPY (EGD);  Surgeon: Danie Binder, MD;  [facility];  Service: Endoscopy;  Laterality: N/A;  . Eye surgery    . Hernia repair    . Radiology with anesthesia N/A 07/10/2013    Procedure: EMBOLIZATION-RADIOLOGY WITH ANESTHESIA;  Surgeon: Rob Hickman, MD;  Location: [place];   Service: Radiology;  Laterality: N/A;  . Nose surgery      d/t nosebleeds  . Video bronchoscopy with endobronchial ultrasound N/A 11/21/2013    Procedure: VIDEO BRONCHOSCOPY WITH ENDOBRONCHIAL ULTRASOUND;  Surgeon: Melrose Nakayama, MD;  Location: [place];  Service: Thoracic;  Laterality: N/A;  . Mediastinoscopy N/A 11/21/2013    Procedure: MEDIASTINOSCOPY;  Surgeon: Melrose Nakayama, MD;  Location: [facility] OR;  Service: Thoracic;  Laterality: N/A;    Family History  Problem Relation Age of Onset  . Cancer Mother   . Cancer Father   . Coronary artery disease Brother   . Colon cancer Brother     Social History:  reports that he has been smoking Cigarettes.  He has a 30 pack-year smoking history. He has never used smokeless tobacco. He reports that he does not drink alcohol or use illicit drugs.  Allergies:  Allergies  Allergen Reactions  . Aspirin Other (See Comments)    On coumadin    Medications: I have reviewed the patient's current medications.  Results for orders placed or performed during the hospital encounter of 02/10/14 (from the past 48 hour(s))  CBC with Differential     Status: Abnormal   Collection Time: 02/10/14 11:06  AM  Result Value Ref Range   WBC 26.5 (H) 4.0 - 10.5 K/uL   RBC 3.76 (L) 4.22 - 5.81 MIL/uL   Hemoglobin 11.0 (L) 13.0 - 17.0 g/dL   HCT 34.3 (L) 39.0 - 52.0 %   MCV 91.2 78.0 - 100.0 fL   MCH 29.3 26.0 - 34.0 pg   MCHC 32.1 30.0 - 36.0 g/dL   RDW 16.4 (H) 11.5 - 15.5 %   Platelets 341 150 - 400 K/uL   Neutrophils Relative % 92 (H) 43 - 77 %   Neutro Abs 24.4 (H) 1.7 - 7.7 K/uL   Lymphocytes Relative 3 (L) 12 - 46 %   Lymphs Abs 0.7 0.7 - 4.0 K/uL   Monocytes Relative 5 3 - 12 %   Monocytes Absolute 1.4 (H) 0.1 - 1.0 K/uL   Eosinophils Relative 0 0 - 5 %   Eosinophils Absolute 0.0 0.0 - 0.7 K/uL   Basophils Relative 0 0 - 1 %   Basophils Absolute 0.0 0.0 - 0.1 K/uL  Comprehensive metabolic panel     Status: Abnormal   Collection Time:  02/10/14 11:06 AM  Result Value Ref Range   Sodium 138 137 - 147 mEq/L   Potassium 5.2 3.7 - 5.3 mEq/L   Chloride 93 (L) 96 - 112 mEq/L   CO2 30 19 - 32 mEq/L   Glucose, Bld 139 (H) 70 - 99 mg/dL   BUN 25 (H) 6 - 23 mg/dL   Creatinine, Ser 1.55 (H) 0.50 - 1.35 mg/dL   Calcium 8.9 8.4 - 10.5 mg/dL   Total Protein 8.0 6.0 - 8.3 g/dL   Albumin 3.0 (L) 3.5 - 5.2 g/dL   AST 18 0 - 37 U/L   ALT 6 0 - 53 U/L   Alkaline Phosphatase 127 (H) 39 - 117 U/L   Total Bilirubin 1.3 (H) 0.3 - 1.2 mg/dL   GFR calc non Af Amer 42 (L) >90 mL/min   GFR calc Af Amer 49 (L) >90 mL/min    Comment: (NOTE) The eGFR has been calculated using the CKD EPI equation. This calculation has not been validated in all clinical situations. eGFR's persistently <90 mL/min signify possible Chronic Kidney Disease.    Anion gap 15 5 - 15  Lipase, blood     Status: Abnormal   Collection Time: 02/10/14 11:06 AM  Result Value Ref Range   Lipase 9 (L) 11 - 59 U/L  TSH     Status: None   Collection Time: 02/10/14 11:06 AM  Result Value Ref Range   TSH 2.610 0.350 - 4.500 uIU/mL    Comment: Performed at [facility]  I-STAT, [place] 8     Status: Abnormal   Collection Time: 02/10/14 11:16 AM  Result Value Ref Range   Sodium 136 (L) 137 - 147 mEq/L   Potassium 4.8 3.7 - 5.3 mEq/L   Chloride 94 (L) 96 - 112 mEq/L   BUN 32 (H) 6 - 23 mg/dL   Creatinine, Ser 1.60 (H) 0.50 - 1.35 mg/dL   Glucose, Bld 132 (H) 70 - 99 mg/dL   Calcium, Ion 1.03 (L) 1.13 - 1.30 mmol/L   TCO2 30 0 - 100 mmol/L   Hemoglobin 12.9 (L) 13.0 - 17.0 g/dL   HCT 38.0 (L) 39.0 - 52.0 %  Protime-INR     Status: Abnormal   Collection Time: 02/10/14 11:48 AM  Result Value Ref Range   Prothrombin Time 24.7 (H) 11.6 - 15.2 seconds  INR 2.21 (H) 0.00 - 1.49  Blood culture (routine x 2)     Status: None (Preliminary result)   Collection Time: 02/10/14  1:50 PM  Result Value Ref Range   Specimen Description BLOOD RIGHT ANTECUBITAL    Special  Requests BOTTLES DRAWN AEROBIC AND ANAEROBIC 6CC    Culture NO GROWTH 1 DAY    Report Status PENDING   Blood culture (routine x 2)     Status: None (Preliminary result)   Collection Time: 02/10/14  2:10 PM  Result Value Ref Range   Specimen Description BLOOD RIGHT ARM    Special Requests      BOTTLES DRAWN AEROBIC AND ANAEROBIC AEB=8CC ANA=6CC   Culture NO GROWTH 1 DAY    Report Status PENDING   Urinalysis, Routine w reflex microscopic     Status: Abnormal   Collection Time: 02/10/14  7:45 PM  Result Value Ref Range   Color, Urine YELLOW YELLOW   APPearance CLEAR CLEAR   Specific Gravity, Urine <1.005 (L) 1.005 - 1.030   pH 5.5 5.0 - 8.0   Glucose, UA NEGATIVE NEGATIVE mg/dL   Hgb urine dipstick SMALL (A) NEGATIVE   Bilirubin Urine NEGATIVE NEGATIVE   Ketones, ur NEGATIVE NEGATIVE mg/dL   Protein, ur 30 (A) NEGATIVE mg/dL   Urobilinogen, UA 0.2 0.0 - 1.0 mg/dL   Nitrite NEGATIVE NEGATIVE   Leukocytes, UA NEGATIVE NEGATIVE  Urine microscopic-add on     Status: Abnormal   Collection Time: 02/10/14  7:45 PM  Result Value Ref Range   Squamous Epithelial / LPF MANY (A) RARE   WBC, UA 0-2 <3 WBC/hpf   RBC / HPF 0-2 <3 RBC/hpf   Bacteria, UA FEW (A) RARE  Comprehensive metabolic panel     Status: Abnormal   Collection Time: 02/11/14  5:54 AM  Result Value Ref Range   Sodium 138 137 - 147 mEq/L   Potassium 4.4 3.7 - 5.3 mEq/L   Chloride 94 (L) 96 - 112 mEq/L   CO2 34 (H) 19 - 32 mEq/L   Glucose, Bld 130 (H) 70 - 99 mg/dL   BUN 29 (H) 6 - 23 mg/dL   Creatinine, Ser 1.70 (H) 0.50 - 1.35 mg/dL   Calcium 9.0 8.4 - 10.5 mg/dL   Total Protein 7.2 6.0 - 8.3 g/dL   Albumin 2.6 (L) 3.5 - 5.2 g/dL   AST 15 0 - 37 U/L   ALT 6 0 - 53 U/L   Alkaline Phosphatase 98 39 - 117 U/L   Total Bilirubin 1.0 0.3 - 1.2 mg/dL   GFR calc non Af Amer 38 (L) >90 mL/min   GFR calc Af Amer 44 (L) >90 mL/min    Comment: (NOTE) The eGFR has been calculated using the CKD EPI equation. This calculation  has not been validated in all clinical situations. eGFR's persistently <90 mL/min signify possible Chronic Kidney Disease.    Anion gap 10 5 - 15  Protime-INR     Status: Abnormal   Collection Time: 02/11/14  5:54 AM  Result Value Ref Range   Prothrombin Time 27.3 (H) 11.6 - 15.2 seconds   INR 2.51 (H) 0.00 - 1.49    Dg Abd 1 View  02/11/2014   CLINICAL DATA:  Initial evaluation for abdominal distention  EXAM: ABDOMEN - 1 VIEW  COMPARISON:  02/10/2014 CT scan  FINDINGS: IV contrast is seen opacifying the bladder. There is significant gaseous distention of the stomach. There is mild small bowel dilatation in  the midline to 4.6 cm. There is air and stool into the colon.  IMPRESSION: Findings most consistent with ileus. There is gaseous distention of the stomach.   Electronically Signed   By: Skipper Cliche M.D.   On: 02/11/2014 15:54   Ct Abdomen Pelvis W Contrast  02/10/2014   CLINICAL DATA:  73 year old with left lower quadrant pain and tenderness  EXAM: CT ABDOMEN AND PELVIS WITH CONTRAST  TECHNIQUE: Multidetector CT imaging of the abdomen and pelvis was performed using the standard protocol following bolus administration of intravenous contrast.  CONTRAST:  70m OMNIPAQUE IOHEXOL 300 MG/ML SOLN, 1080mOMNIPAQUE IOHEXOL 300 MG/ML SOLN  COMPARISON:  None.  FINDINGS: Chest:The partially visualized chest demonstrates an enlarged heart, specifically the right atrium. Bronchiectatic changes are noted within the lung bases, associated areas of linear consolidation are also present in the lung bases. There is no pleural effusion.  Liver: No focal hepatic masses identified. There is no intrahepatic biliary ductal dilatation.  Gallbladder: Unremarkable.  Spleen: Unremarkable.  Pancreas: Unremarkable.  Adrenal glands: Unremarkable.  Kidneys: There is no nephrolithiasis or hydronephrosis. No suspicious renal mass is seen.  Bowel/gastrointestinal tract: Diffuse inflammatory fat stranding is noted within the  right lower quadrant. A small rim enhancing fluid collection is also present within the right lower quadrant measuring approximately 4.5 x 3.2 cm (series 2, image 53). Tiny foci of free air are noted just anterior to this fluid collection within the right lower quadrant. An adjacent loop of small bowel with diffuse wall thickening and wall edema is present. The appendix is not identified. This constellation of finding is concerning for a a ruptured appendicitis.  Pelvis: There is no pelvic mass. The prostate gland is enlarged and measures approximately 5.0 x 4.0 cm.  Miscellaneous: Scattered vascular calcifications are present.  Osseous structures: Multilevel degenerative changes of the spine are noted.  IMPRESSION: 1. Findings are concerning for ruptured appendicitis with an associated approximately 4.5 x 3.2 cm abscess as detailed above. An adjacent loop of small bowel also demonstrates associated secondary enteritis. These results were called by telephone at the time of interpretation on 02/10/2014 at 1:25 pm to Dr. JOMilton Ferguson who verbally acknowledged these results.  2. Bilateral lower lobe bronchiectatic changes and consolidation are suspicious for chronic aspiration pneumonitis.  3.  Cardiac enlargement, specifically involving the right atrium.   Electronically Signed   By: KeRosemarie Ax On: 02/10/2014 13:32   Dg Chest Port 1 View  02/11/2014   CLINICAL DATA:  Under show evaluation for dyspnea, history of hypertension asthma and bronchitis  EXAM: PORTABLE CHEST - 1 VIEW  COMPARISON:  11/19/2013  FINDINGS: Severe cardiac enlargement. Mild vascular congestion. No evidence of edema infiltrate or consolidation. No pleural effusion.  IMPRESSION: Stable severe cardiac enlargement   Electronically Signed   By: RaSkipper Cliche.D.   On: 02/11/2014 13:42    Review of Systems  Constitutional: Positive for malaise/fatigue. Negative for fever and chills.  HENT: Negative.   Respiratory: Positive for  shortness of breath.   Cardiovascular: Positive for orthopnea.  Gastrointestinal: Positive for abdominal pain.  Genitourinary: Negative.   Musculoskeletal: Negative.   Neurological: Negative.   Endo/Heme/Allergies: Negative.   Psychiatric/Behavioral: Positive for memory loss.   Blood pressure 108/62, pulse 92, temperature 97.9 F (36.6 C), temperature source Axillary, resp. rate 18, height 5' 6" (1.676 m), weight 140 lb 3.2 oz (63.594 kg), SpO2 100 %. Physical Exam  Constitutional: He is oriented to person, place, and time.  He has a sickly appearance.  HENT:  Head: Normocephalic.  Eyes: Pupils are equal, round, and reactive to light. No scleral icterus.  Neck: Normal range of motion. Neck supple.  Cardiovascular: Normal rate and regular rhythm.   Respiratory: Effort normal. He has wheezes.  GI: There is tenderness in the right lower quadrant. There is rebound, guarding and tenderness at McBurney's point. There is no rigidity.  Musculoskeletal: Normal range of motion.  Neurological: He is alert and oriented to person, place, and time.  Skin: Skin is warm and dry.  Psychiatric: He has a normal mood and affect. His behavior is normal. Judgment and thought content normal.    Assessment/Plan: PERFORATED APPENDICITIS May need percutaneous drain once INR improved.   Continue ABX CHF per primary team Atrial fibrillation per primary team   Jaquia Benedicto A. 02/11/2014, 8:31 PM

## 2014-02-12 ENCOUNTER — Inpatient Hospital Stay (HOSPITAL_COMMUNITY): Payer: Medicare Other

## 2014-02-12 DIAGNOSIS — K37 Unspecified appendicitis: Secondary | ICD-10-CM

## 2014-02-12 DIAGNOSIS — J449 Chronic obstructive pulmonary disease, unspecified: Secondary | ICD-10-CM

## 2014-02-12 LAB — CBC
HCT: 28.3 % — ABNORMAL LOW (ref 39.0–52.0)
Hemoglobin: 8.9 g/dL — ABNORMAL LOW (ref 13.0–17.0)
MCH: 27.4 pg (ref 26.0–34.0)
MCHC: 31.4 g/dL (ref 30.0–36.0)
MCV: 87.1 fL (ref 78.0–100.0)
PLATELETS: 266 10*3/uL (ref 150–400)
RBC: 3.25 MIL/uL — AB (ref 4.22–5.81)
RDW: 16.3 % — AB (ref 11.5–15.5)
WBC: 17.9 10*3/uL — ABNORMAL HIGH (ref 4.0–10.5)

## 2014-02-12 LAB — HEMOGLOBIN AND HEMATOCRIT, BLOOD
HEMATOCRIT: 26.1 % — AB (ref 39.0–52.0)
Hemoglobin: 8.3 g/dL — ABNORMAL LOW (ref 13.0–17.0)

## 2014-02-12 LAB — BASIC METABOLIC PANEL
ANION GAP: 14 (ref 5–15)
BUN: 37 mg/dL — ABNORMAL HIGH (ref 6–23)
CHLORIDE: 94 meq/L — AB (ref 96–112)
CO2: 30 meq/L (ref 19–32)
CREATININE: 1.75 mg/dL — AB (ref 0.50–1.35)
Calcium: 9 mg/dL (ref 8.4–10.5)
GFR calc non Af Amer: 37 mL/min — ABNORMAL LOW (ref >90)
GFR, EST AFRICAN AMERICAN: 42 mL/min — AB (ref >90)
Glucose, Bld: 86 mg/dL (ref 70–99)
POTASSIUM: 4.1 meq/L (ref 3.7–5.3)
SODIUM: 138 meq/L (ref 137–147)

## 2014-02-12 LAB — TROPONIN I

## 2014-02-12 MED ORDER — SODIUM CHLORIDE 0.9 % IV SOLN: Freq: Once | INTRAVENOUS | Status: DC

## 2014-02-12 MED ORDER — VANCOMYCIN HCL IN DEXTROSE 1-5 GM/200ML-% IV SOLN
1000.0000 mg | INTRAVENOUS | Status: DC
  Administered 2014-02-12 – 2014-02-16 (×5): 1000 mg via INTRAVENOUS
  Filled 2014-02-12 (×6): qty 200

## 2014-02-12 MED ORDER — FUROSEMIDE 10 MG/ML IJ SOLN
40.0000 mg | Freq: Once | INTRAMUSCULAR | Status: AC
  Administered 2014-02-12: 40 mg via INTRAVENOUS
  Filled 2014-02-12: qty 4

## 2014-02-12 MED ORDER — PIPERACILLIN-TAZOBACTAM 3.375 G IVPB
3.3750 g | Freq: Three times a day (TID) | INTRAVENOUS | Status: DC
  Administered 2014-02-12 – 2014-02-17 (×14): 3.375 g via INTRAVENOUS
  Filled 2014-02-12 (×16): qty 50

## 2014-02-12 MED ORDER — SODIUM CHLORIDE 0.9 % IV SOLN: INTRAVENOUS | Status: DC

## 2014-02-12 MED ORDER — VITAMIN K1 10 MG/ML IJ SOLN
5.0000 mg | Freq: Once | INTRAMUSCULAR | Status: AC
  Administered 2014-02-12: 5 mg via SUBCUTANEOUS
  Filled 2014-02-12: qty 0.5

## 2014-02-12 NOTE — Progress Notes (Signed)
SLP Cancellation Note  Patient Details Name: Theodore [name] MRN: [id number] DOB: 04-05-1940   Cancelled treatment:       Reason Eval/Treat Not Completed: Per MD, pt not yet medically ready for swallowing evaluation - SLP to sign off at this time, with medical team to re-order when appropriate.     Germain Osgood, M.A. CCC-SLP [phone]  Germain Osgood 02/12/2014, 11:00 AM

## 2014-02-12 NOTE — Progress Notes (Signed)
Pt c/o chest pain.  Performing EKG and will notify physician.  Rapid Response called and interpreted EKG results as A. Fib.  Pt very congested.  Will notify MD. [name]

## 2014-02-12 NOTE — Progress Notes (Signed)
Pt had run of Vtach while placing NG tube.  Will notify MD. [name]

## 2014-02-12 NOTE — Progress Notes (Signed)
Called by RN stating patient having CP.  On arrival patient alert warm and dry - NGT patent nares - old blood around nares - green bile return.  Patient states chest pain is gone - describes it as pressure - mostly when he coughs - not nausea - no SOB - reproducible with cough - goes away without tx.  12 lead shows afib - no changes noted from prior 12 leads. Bil BS with coarse Rhonci - some rales posterior bases - worse on left side.  MD aware - low UOP today - Lasix ordered.    Abd round - pain with palp.  BP no changes.  MD aware per RN Lovena Le.  To call as needed.

## 2014-02-12 NOTE — Progress Notes (Signed)
MD gave new orders. MD made aware of pt having very little urine output with increased congestion from this morning.  VSS.  MD discontinuing all IV fluids but still ordered to give last bag of FFP.  Will continue to monitor. [name]

## 2014-02-12 NOTE — Progress Notes (Signed)
Pt having some mild epistaxis from NG tube placement.  MD notified. [name]

## 2014-02-12 NOTE — Plan of Care (Signed)
Problem: Phase III Progression Outcomes Goal: Voiding independently Outcome: Completed/Met Date Met:  02/12/14

## 2014-02-12 NOTE — Progress Notes (Signed)
Patient ID: Theodore [name], male   DOB: [birth year], 73 y.o.   MRN: [id number]    Subjective: Pt having some pain this am, but sitting in his chair comfortably.  Nausea yesterday with diet.  No flatus or BM.  Objective: Vital signs in last 24 hours: Temp:  [97.9 F (36.6 C)-98.7 F (37.1 C)] 98.5 F (36.9 C) (12/03 0520) Pulse Rate:  [68-112] 68 (12/03 0520) Resp:  [16-26] 16 (12/03 0520) BP: (102-146)/(62-112) 106/62 mmHg (12/03 0520) SpO2:  [88 %-100 %] 99 % (12/03 0917) Last BM Date: 02/10/14  Intake/Output from previous day: 12/02 0701 - 12/03 0700 In: 701.3 [I.V.:551.3; IV Piggyback:150] Out: 086 [Urine:875] Intake/Output this shift:    PE: Abd: distended, very tender in lower abdomen more so on the right with some voluntary guarding on palpation.  Few BS Heart: regular Lungs: rhonchi on right side  Lab Results:   Recent Labs  02/10/14 1106 02/10/14 1116  WBC 26.5*  --   HGB 11.0* 12.9*  HCT 34.3* 38.0*  PLT 341  --    BMET  Recent Labs  02/10/14 1106 02/10/14 1116 02/11/14 0554  NA 138 136* 138  K 5.2 4.8 4.4  CL 93* 94* 94*  CO2 30  --  34*  GLUCOSE 139* 132* 130*  BUN 25* 32* 29*  CREATININE 1.55* 1.60* 1.70*  CALCIUM 8.9  --  9.0   PT/INR  Recent Labs  02/10/14 1148 02/11/14 0554  LABPROT 24.7* 27.3*  INR 2.21* 2.51*   CMP     Component Value Date/Time   NA 138 02/11/2014 0554   K 4.4 02/11/2014 0554   CL 94* 02/11/2014 0554   CO2 34* 02/11/2014 0554   GLUCOSE 130* 02/11/2014 0554   BUN 29* 02/11/2014 0554   CREATININE 1.70* 02/11/2014 0554   CREATININE 1.07 09/29/2013 1132   CALCIUM 9.0 02/11/2014 0554   PROT 7.2 02/11/2014 0554   ALBUMIN 2.6* 02/11/2014 0554   AST 15 02/11/2014 0554   ALT 6 02/11/2014 0554   ALKPHOS 98 02/11/2014 0554   BILITOT 1.0 02/11/2014 0554   GFRNONAA 38* 02/11/2014 0554   GFRNONAA 68 09/29/2013 1132   GFRAA 44* 02/11/2014 0554   GFRAA 79 09/29/2013 1132   Lipase     Component Value Date/Time    LIPASE 9* 02/10/2014 1106       Studies/Results: Dg Abd 1 View  02/11/2014   CLINICAL DATA:  Initial evaluation for abdominal distention  EXAM: ABDOMEN - 1 VIEW  COMPARISON:  02/10/2014 CT scan  FINDINGS: IV contrast is seen opacifying the bladder. There is significant gaseous distention of the stomach. There is mild small bowel dilatation in the midline to 4.6 cm. There is air and stool into the colon.  IMPRESSION: Findings most consistent with ileus. There is gaseous distention of the stomach.   Electronically Signed   By: Skipper Cliche M.D.   On: 02/11/2014 15:54   Ct Abdomen Pelvis W Contrast  02/10/2014   CLINICAL DATA:  73 year old with left lower quadrant pain and tenderness  EXAM: CT ABDOMEN AND PELVIS WITH CONTRAST  TECHNIQUE: Multidetector CT imaging of the abdomen and pelvis was performed using the standard protocol following bolus administration of intravenous contrast.  CONTRAST:  50mL OMNIPAQUE IOHEXOL 300 MG/ML SOLN, 133mL OMNIPAQUE IOHEXOL 300 MG/ML SOLN  COMPARISON:  None.  FINDINGS: Chest:The partially visualized chest demonstrates an enlarged heart, specifically the right atrium. Bronchiectatic changes are noted within the lung bases, associated areas of linear consolidation are  also present in the lung bases. There is no pleural effusion.  Liver: No focal hepatic masses identified. There is no intrahepatic biliary ductal dilatation.  Gallbladder: Unremarkable.  Spleen: Unremarkable.  Pancreas: Unremarkable.  Adrenal glands: Unremarkable.  Kidneys: There is no nephrolithiasis or hydronephrosis. No suspicious renal mass is seen.  Bowel/gastrointestinal tract: Diffuse inflammatory fat stranding is noted within the right lower quadrant. A small rim enhancing fluid collection is also present within the right lower quadrant measuring approximately 4.5 x 3.2 cm (series 2, image 53). Tiny foci of free air are noted just anterior to this fluid collection within the right lower quadrant. An  adjacent loop of small bowel with diffuse wall thickening and wall edema is present. The appendix is not identified. This constellation of finding is concerning for a a ruptured appendicitis.  Pelvis: There is no pelvic mass. The prostate gland is enlarged and measures approximately 5.0 x 4.0 cm.  Miscellaneous: Scattered vascular calcifications are present.  Osseous structures: Multilevel degenerative changes of the spine are noted.  IMPRESSION: 1. Findings are concerning for ruptured appendicitis with an associated approximately 4.5 x 3.2 cm abscess as detailed above. An adjacent loop of small bowel also demonstrates associated secondary enteritis. These results were called by telephone at the time of interpretation on 02/10/2014 at 1:25 pm to Dr. Milton Ferguson , who verbally acknowledged these results.  2. Bilateral lower lobe bronchiectatic changes and consolidation are suspicious for chronic aspiration pneumonitis.  3.  Cardiac enlargement, specifically involving the right atrium.   Electronically Signed   By: Rosemarie Ax   On: 02/10/2014 13:32   Dg Chest Port 1 View  02/11/2014   CLINICAL DATA:  Under show evaluation for dyspnea, history of hypertension asthma and bronchitis  EXAM: PORTABLE CHEST - 1 VIEW  COMPARISON:  11/19/2013  FINDINGS: Severe cardiac enlargement. Mild vascular congestion. No evidence of edema infiltrate or consolidation. No pleural effusion.  IMPRESSION: Stable severe cardiac enlargement   Electronically Signed   By: Skipper Cliche M.D.   On: 02/11/2014 13:42    Anti-infectives: Anti-infectives    Start     Dose/Rate Route Frequency Ordered Stop   02/11/14 1700  vancomycin (VANCOCIN) IVPB 1000 mg/200 mL premix     1,000 mg200 mL/hr over 60 Minutes Intravenous Every 24 hours 02/10/14 1607     02/10/14 2200  piperacillin-tazobactam (ZOSYN) IVPB 3.375 g     3.375 g12.5 mL/hr over 240 Minutes Intravenous Every 8 hours 02/10/14 1602     02/10/14 1700  vancomycin (VANCOCIN)  1,250 mg in sodium chloride 0.9 % 250 mL IVPB     1,250 mg166.7 mL/hr over 90 Minutes Intravenous  Once 02/10/14 1605 02/10/14 1934   02/10/14 1345  piperacillin-tazobactam (ZOSYN) IVPB 3.375 g     3.375 g100 mL/hr over 30 Minutes Intravenous  Once 02/10/14 1344 02/10/14 1452       Assessment/Plan  1. Perforated appendicitis with abscess 2. Ileus vs sbo secondary to #1 3. CKD 4. Anticoagulated, INR 2.51. 5. Leukocytosis  Plan: 1. Awaiting labs today.  WBC Tuesday was 26K 2. Insert NGT due to significant gastric distention and small bowel dilatation 3. Cont NPO 4. Will have IR evaluate for drain placement.  If patient worsens or unable to get a drain, he will likely need surgical intervention. 5. Will follow closely   LOS: 2 days    Ren Aspinall E 02/12/2014, 10:06 AM Pager: [phone]

## 2014-02-12 NOTE — Progress Notes (Signed)
[facility] PROGRESS NOTE  Theodore [name] IRJ:[id number] DOB: [birth year] DOA: 02/10/2014 PCP: Tula Nakayama, MD  Assessment/Plan: 73 y.o. male with PMH of HTN, COPD on home oxygen, CHF, A. fib on Coumadin, anemia presented  abdominal and found to have ruptured appendicitis with associated abscess and acute renal failure -Initially patient was admitted to [facility] evaluted by surgeon who recommended non-operative management; but later due to progression of perforated appendix  Requested to be TF to [place] for further evaluation management   1. Perforated appendix with abscess/sepsis; CT: Findings are concerning for ruptured appendicitis with an associated approximately 4.5 x 3.2 cm abscess  -NG, NPO, IVF, cont IV atx; awaiting IR reevaluation for drainage, will give FFP+vit K, f/u INR, patient likely need IR intervention or surgical management -surgery following   2.  CHF, diastolic HF; echo (3016): LVEF 01%, diastolic dysfunction, pulmonary HTN PASP 74, TR -thought mild hypervolemic at [facility], given lasix; currently clinically stable; gentle IVF while NPO; hold lasix; use prn; monitor I/O, daily weight, fluid balance  -gentle IVF while NPO; NG 3. COPD on home oxygen; no wheezing on exam; cont oxygen, bronchodilators as needed  4. CT: Bilateral lower lobe bronchiectatic changes and consolidation are suspicious for chronic aspiration pneumonitis -cont NG for now; needs SLP eval when stable; cont IV atx;  5. A. fib on Coumadin; HR is stable; coumadin on hold for procedure, FFP, +vit K one dose due to possible surgery;  6. AKI on CKD II in the setting of sepsis; likely ATN;  -gentle IVF, close monitor I/O, daily weight; urine output  7. Acute anemia, ? Baseline around 8.5-9.0; no obvious s/s of bleeding; monitor TF prn;   Code Status: full Family Communication: d/w patient, (indicate person spoken with, relationship, and if by phone, the number) Disposition Plan: pend surgery     Consultants:  Surgery   Procedures:  Pend IR eval   Antibiotics:  zosyn 02/10/14>>  Vancomycin 02/10/14>>   (indicate start date, and stop date if known)  HPI/Subjective: Alert, oriented   Objective: Filed Vitals:   02/12/14 0520  BP: 106/62  Pulse: 68  Temp: 98.5 F (36.9 C)  Resp: 16    Intake/Output Summary (Last 24 hours) at 02/12/14 1014 Last data filed at 02/12/14 0649  Gross per 24 hour  Intake 701.25 ml  Output    750 ml  Net -48.75 ml   Filed Weights   02/10/14 1035 02/10/14 1535 02/11/14 0548  Weight: 63.504 kg (140 lb) 63.504 kg (140 lb) 63.594 kg (140 lb 3.2 oz)    Exam:   General:  alert  Cardiovascular: s1,s2 irregular   Respiratory: poor ventilation in LL  Abdomen: soft, distended, mild tender  Musculoskeletal: no pedal edema   Data Reviewed: Basic Metabolic Panel:  Recent Labs Lab 02/10/14 1106 02/10/14 1116 02/11/14 0554  NA 138 136* 138  K 5.2 4.8 4.4  CL 93* 94* 94*  CO2 30  --  34*  GLUCOSE 139* 132* 130*  BUN 25* 32* 29*  CREATININE 1.55* 1.60* 1.70*  CALCIUM 8.9  --  9.0   Liver Function Tests:  Recent Labs Lab 02/10/14 1106 02/11/14 0554  AST 18 15  ALT 6 6  ALKPHOS 127* 98  BILITOT 1.3* 1.0  PROT 8.0 7.2  ALBUMIN 3.0* 2.6*    Recent Labs Lab 02/10/14 1106  LIPASE 9*   No results for input(s): AMMONIA in the last 168 hours. CBC:  Recent Labs Lab 02/10/14 1106 02/10/14 1116  WBC 26.5*  --   NEUTROABS 24.4*  --   HGB 11.0* 12.9*  HCT 34.3* 38.0*  MCV 91.2  --   PLT 341  --    Cardiac Enzymes: No results for input(s): CKTOTAL, CKMB, CKMBINDEX, TROPONINI in the last 168 hours. BNP (last 3 results)  Recent Labs  06/16/13 1554 06/17/13 0537 07/09/13 0555  PROBNP 3560.0* 3222.0* 3842.0*   CBG: No results for input(s): GLUCAP in the last 168 hours.  Recent Results (from the past 240 hour(s))  Blood culture (routine x 2)     Status: None (Preliminary result)   Collection Time:  02/10/14  1:50 PM  Result Value Ref Range Status   Specimen Description BLOOD RIGHT ANTECUBITAL  Final   Special Requests BOTTLES DRAWN AEROBIC AND ANAEROBIC 6CC  Final   Culture NO GROWTH 2 DAYS  Final   Report Status PENDING  Incomplete  Blood culture (routine x 2)     Status: None (Preliminary result)   Collection Time: 02/10/14  2:10 PM  Result Value Ref Range Status   Specimen Description BLOOD RIGHT ARM  Final   Special Requests   Final    BOTTLES DRAWN AEROBIC AND ANAEROBIC AEB=8CC ANA=6CC   Culture NO GROWTH 2 DAYS  Final   Report Status PENDING  Incomplete     Studies: Dg Abd 1 View  02/11/2014   CLINICAL DATA:  Initial evaluation for abdominal distention  EXAM: ABDOMEN - 1 VIEW  COMPARISON:  02/10/2014 CT scan  FINDINGS: IV contrast is seen opacifying the bladder. There is significant gaseous distention of the stomach. There is mild small bowel dilatation in the midline to 4.6 cm. There is air and stool into the colon.  IMPRESSION: Findings most consistent with ileus. There is gaseous distention of the stomach.   Electronically Signed   By: Skipper Cliche M.D.   On: 02/11/2014 15:54   Ct Abdomen Pelvis W Contrast  02/10/2014   CLINICAL DATA:  73 year old with left lower quadrant pain and tenderness  EXAM: CT ABDOMEN AND PELVIS WITH CONTRAST  TECHNIQUE: Multidetector CT imaging of the abdomen and pelvis was performed using the standard protocol following bolus administration of intravenous contrast.  CONTRAST:  52mL OMNIPAQUE IOHEXOL 300 MG/ML SOLN, 16mL OMNIPAQUE IOHEXOL 300 MG/ML SOLN  COMPARISON:  None.  FINDINGS: Chest:The partially visualized chest demonstrates an enlarged heart, specifically the right atrium. Bronchiectatic changes are noted within the lung bases, associated areas of linear consolidation are also present in the lung bases. There is no pleural effusion.  Liver: No focal hepatic masses identified. There is no intrahepatic biliary ductal dilatation.  Gallbladder:  Unremarkable.  Spleen: Unremarkable.  Pancreas: Unremarkable.  Adrenal glands: Unremarkable.  Kidneys: There is no nephrolithiasis or hydronephrosis. No suspicious renal mass is seen.  Bowel/gastrointestinal tract: Diffuse inflammatory fat stranding is noted within the right lower quadrant. A small rim enhancing fluid collection is also present within the right lower quadrant measuring approximately 4.5 x 3.2 cm (series 2, image 53). Tiny foci of free air are noted just anterior to this fluid collection within the right lower quadrant. An adjacent loop of small bowel with diffuse wall thickening and wall edema is present. The appendix is not identified. This constellation of finding is concerning for a a ruptured appendicitis.  Pelvis: There is no pelvic mass. The prostate gland is enlarged and measures approximately 5.0 x 4.0 cm.  Miscellaneous: Scattered vascular calcifications are present.  Osseous structures: Multilevel degenerative changes of the spine are noted.  IMPRESSION: 1. Findings are concerning for ruptured appendicitis with an associated approximately 4.5 x 3.2 cm abscess as detailed above. An adjacent loop of small bowel also demonstrates associated secondary enteritis. These results were called by telephone at the time of interpretation on 02/10/2014 at 1:25 pm to Dr. Milton Ferguson , who verbally acknowledged these results.  2. Bilateral lower lobe bronchiectatic changes and consolidation are suspicious for chronic aspiration pneumonitis.  3.  Cardiac enlargement, specifically involving the right atrium.   Electronically Signed   By: Rosemarie Ax   On: 02/10/2014 13:32   Dg Chest Port 1 View  02/11/2014   CLINICAL DATA:  Under show evaluation for dyspnea, history of hypertension asthma and bronchitis  EXAM: PORTABLE CHEST - 1 VIEW  COMPARISON:  11/19/2013  FINDINGS: Severe cardiac enlargement. Mild vascular congestion. No evidence of edema infiltrate or consolidation. No pleural effusion.   IMPRESSION: Stable severe cardiac enlargement   Electronically Signed   By: Skipper Cliche M.D.   On: 02/11/2014 13:42    Scheduled Meds: . albuterol  2.5 mg Nebulization Q6H  . allopurinol  300 mg Oral Daily  . antiseptic oral rinse  7 mL Mouth Rinse BID  . brimonidine  1 drop Both Eyes Q12H   And  . timolol  1 drop Both Eyes Q12H  . budesonide-formoterol  2 puff Inhalation BID  . diltiazem  180 mg Oral Daily  . latanoprost  1 drop Both Eyes QHS  . montelukast  10 mg Oral QHS  . piperacillin-tazobactam (ZOSYN)  IV  3.375 g Intravenous Q8H  . sodium chloride  3 mL Intravenous Q12H  . tiotropium  18 mcg Inhalation Daily  . vancomycin  1,000 mg Intravenous Q24H   Continuous Infusions: . sodium chloride 10 mL/hr at 02/11/14 1310    Principal Problem:   Appendicitis Active Problems:   Essential hypertension   Atrial fibrillation   Tobacco abuse disorder   Diastolic CHF with preserved left ventricular function, NYHA class 4   COPD (chronic obstructive pulmonary disease)   Leukocytosis   ARF (acute renal failure)   Enteritis   Abscess   Acute perforated appendicitis   Appendicitis with abscess   Abdominal distention    Time spent: >35 minutes     Kinnie Feil  [facility] Pager [phone]. If 7PM-7AM, please contact night-coverage at [email or web address], password [email or web address] 02/12/2014, 10:14 AM  LOS: 2 days

## 2014-02-12 NOTE — Plan of Care (Signed)
Problem: Phase I Progression Outcomes Goal: Pain controlled with appropriate interventions Outcome: Progressing     

## 2014-02-12 NOTE — Progress Notes (Signed)
Pt having what looks like, old blood draining from NG tube.  Verified placement.  Pt still having some epistaxis.  Will notify PA of findings. [name]

## 2014-02-13 ENCOUNTER — Inpatient Hospital Stay (HOSPITAL_COMMUNITY): Payer: Medicare Other

## 2014-02-13 DIAGNOSIS — I481 Persistent atrial fibrillation: Secondary | ICD-10-CM

## 2014-02-13 DIAGNOSIS — J439 Emphysema, unspecified: Secondary | ICD-10-CM

## 2014-02-13 DIAGNOSIS — I2699 Other pulmonary embolism without acute cor pulmonale: Secondary | ICD-10-CM

## 2014-02-13 DIAGNOSIS — Z01810 Encounter for preprocedural cardiovascular examination: Secondary | ICD-10-CM

## 2014-02-13 LAB — BASIC METABOLIC PANEL
ANION GAP: 16 — AB (ref 5–15)
BUN: 42 mg/dL — ABNORMAL HIGH (ref 6–23)
CHLORIDE: 96 meq/L (ref 96–112)
CO2: 28 meq/L (ref 19–32)
Calcium: 9.2 mg/dL (ref 8.4–10.5)
Creatinine, Ser: 1.69 mg/dL — ABNORMAL HIGH (ref 0.50–1.35)
GFR calc Af Amer: 44 mL/min — ABNORMAL LOW (ref >90)
GFR calc non Af Amer: 38 mL/min — ABNORMAL LOW (ref >90)
Glucose, Bld: 94 mg/dL (ref 70–99)
POTASSIUM: 4 meq/L (ref 3.7–5.3)
Sodium: 140 mEq/L (ref 137–147)

## 2014-02-13 LAB — HEMOGLOBIN AND HEMATOCRIT, BLOOD
HEMATOCRIT: 28.4 % — AB (ref 39.0–52.0)
HEMATOCRIT: 28.3 % — AB (ref 39.0–52.0)
HEMOGLOBIN: 9 g/dL — AB (ref 13.0–17.0)
HEMOGLOBIN: 9.2 g/dL — AB (ref 13.0–17.0)

## 2014-02-13 LAB — CBC
HEMATOCRIT: 26 % — AB (ref 39.0–52.0)
HEMOGLOBIN: 8.4 g/dL — AB (ref 13.0–17.0)
MCH: 27.8 pg (ref 26.0–34.0)
MCHC: 32.3 g/dL (ref 30.0–36.0)
MCV: 86.1 fL (ref 78.0–100.0)
Platelets: 257 10*3/uL (ref 150–400)
RBC: 3.02 MIL/uL — AB (ref 4.22–5.81)
RDW: 16.3 % — ABNORMAL HIGH (ref 11.5–15.5)
WBC: 16 10*3/uL — ABNORMAL HIGH (ref 4.0–10.5)

## 2014-02-13 LAB — PREPARE FRESH FROZEN PLASMA
UNIT DIVISION: 0
Unit division: 0

## 2014-02-13 LAB — PROTIME-INR
INR: 1.75 — ABNORMAL HIGH (ref 0.00–1.49)
Prothrombin Time: 20.6 seconds — ABNORMAL HIGH (ref 11.6–15.2)

## 2014-02-13 LAB — TROPONIN I

## 2014-02-13 MED ORDER — PANTOPRAZOLE SODIUM 40 MG IV SOLR
40.0000 mg | Freq: Two times a day (BID) | INTRAVENOUS | Status: DC
  Administered 2014-02-13 – 2014-02-17 (×10): 40 mg via INTRAVENOUS
  Filled 2014-02-13 (×11): qty 40

## 2014-02-13 MED ORDER — PANTOPRAZOLE SODIUM 40 MG IV SOLR
INTRAVENOUS | Status: AC
  Filled 2014-02-13: qty 40

## 2014-02-13 MED ORDER — KCL IN DEXTROSE-NACL 20-5-0.45 MEQ/L-%-% IV SOLN
INTRAVENOUS | Status: DC
  Administered 2014-02-13 – 2014-02-21 (×10): via INTRAVENOUS
  Filled 2014-02-13 (×19): qty 1000

## 2014-02-13 NOTE — Progress Notes (Signed)
[facility] for Vancomycin and Zosyn Indication: sepsis, perforated appendix  Allergies  Allergen Reactions  . Aspirin Other (See Comments)    On coumadin   Labs:  Recent Labs  02/11/14 0554  02/12/14 1120 02/12/14 1904 02/13/14 0103 02/13/14 0615  WBC  --   --  17.9*  --  16.0*  --   HGB  --   < > 8.9* 8.3* 8.4* 9.0*  PLT  --   --  266  --  257  --   CREATININE 1.70*  --  1.75*  --  1.69*  --   < > = values in this interval not displayed. Estimated Creatinine Clearance: 34.6 mL/min (by C-G formula based on Cr of 1.69). No results for input(s): VANCOTROUGH, VANCOPEAK, VANCORANDOM, GENTTROUGH, GENTPEAK, GENTRANDOM, TOBRATROUGH, TOBRAPEAK, TOBRARND, AMIKACINPEAK, AMIKACINTROU, AMIKACIN in the last 72 hours.   Microbiology: Recent Results (from the past 720 hour(s))  Blood culture (routine x 2)     Status: None (Preliminary result)   Collection Time: 02/10/14  1:50 PM  Result Value Ref Range Status   Specimen Description BLOOD RIGHT ANTECUBITAL  Final   Special Requests BOTTLES DRAWN AEROBIC AND ANAEROBIC 6CC  Final   Culture NO GROWTH 2 DAYS  Final   Report Status PENDING  Incomplete  Blood culture (routine x 2)     Status: None (Preliminary result)   Collection Time: 02/10/14  2:10 PM  Result Value Ref Range Status   Specimen Description BLOOD RIGHT ARM  Final   Special Requests   Final    BOTTLES DRAWN AEROBIC AND ANAEROBIC AEB=8CC ANA=6CC   Culture NO GROWTH 2 DAYS  Final   Report Status PENDING  Incomplete     Assessment: 73 year old male continues on broad spectrum antibiotics for ruptured appendix Vancomycin / Zosyn (12/1>  ) Cultures negative to date Scr stable at 1.69  Goal of Therapy:  Vancomycin trough level 15-20 mcg/ml Eradicate infection.  Plan: Continue Vancomycin 1 Gram iv Q 24 hours Continue Zosyn 3.375 grams iv Q 8 hours - 4 hr infusion Continue to follow  Thank you. Anette Guarneri, PharmD [phone] Tad Moore 02/13/2014,2:12 PM

## 2014-02-13 NOTE — Progress Notes (Addendum)
[facility] PROGRESS NOTE  Theodore [name] JJK:[id number] DOB: August 22, 1940 DOA: 02/10/2014 PCP: Tula Nakayama, MD  Assessment/Plan:  1. Perforated appendix with abscess/sepsis; CT: Findings are concerning for ruptured appendicitis with an associated approximately 4.5 x 3.2 cm abscess  -Awaiting further recommendations from surgery - Given complaints of chest discomfort agree with cardiology consultation prior to operation. Reached out to cardiology but they had already been contacted by Gen surg. Would like to thank General surgery for their assistance.  2. CHF, diastolic HF; echo (3716): LVEF 96%, diastolic dysfunction, pulmonary HTN PASP 74, TR -thought mild hypervolemic at [facility], given lasix; currently clinically stable; gentle IVF while NPO; hold lasix; use prn; monitor I/O, daily weight, fluid balance  -gentle MIVF while NPO; NG  3. COPD on home oxygen; no wheezing on exam; cont supplemental oxygen, bronchodilators as needed   4. CT: Bilateral lower lobe bronchiectatic changes and consolidation are suspicious for chronic aspiration pneumonitis -cont NG for now; needs SLP eval when stable; continue IV atx;   5. A. fib on Coumadin; HR is stable; coumadin on hold for procedure, FFP,  S/p +vit K one dose due to possible surgery;   6. AKI on CKD II in the setting of sepsis; likely ATN;  -gentle IVF, close monitor I/O, daily weight; urine output   7. Acute anemia - stable with last value at 9.0  Code Status: stable Family Communication: None at bedside Disposition Plan: Pending recommendations from specialist involved   Consultants:  General surgery  IR  Cardiology  Procedures:  pending  Antibiotics:  Vancomycin and zosyn  HPI/Subjective: Pt has no new complaints.  No acute issues reported overnight.  Objective: Filed Vitals:   02/13/14 1330  BP: 130/68  Pulse: 60  Temp: 97.7 F (36.5 C)  Resp: 16    Intake/Output Summary (Last 24 hours) at  02/13/14 1438 Last data filed at 02/13/14 1300  Gross per 24 hour  Intake    608 ml  Output   1700 ml  Net  -1092 ml   Filed Weights   02/10/14 1535 02/11/14 0548 02/13/14 0500  Weight: 63.504 kg (140 lb) 63.594 kg (140 lb 3.2 oz) 65.772 kg (145 lb)    Exam:   General:  Pt in nad, alert and awake  Cardiovascular: s1 and s2 present, no mrg  Respiratory: cta bl, no wheezes  Abdomen: soft, pain on palpation , no guarding  Musculoskeletal: no clubbing or cyanosis   Data Reviewed: Basic Metabolic Panel:  Recent Labs Lab 02/10/14 1106 02/10/14 1116 02/11/14 0554 02/12/14 1120 02/13/14 0103  NA 138 136* 138 138 140  K 5.2 4.8 4.4 4.1 4.0  CL 93* 94* 94* 94* 96  CO2 30  --  34* 30 28  GLUCOSE 139* 132* 130* 86 94  BUN 25* 32* 29* 37* 42*  CREATININE 1.55* 1.60* 1.70* 1.75* 1.69*  CALCIUM 8.9  --  9.0 9.0 9.2   Liver Function Tests:  Recent Labs Lab 02/10/14 1106 02/11/14 0554  AST 18 15  ALT 6 6  ALKPHOS 127* 98  BILITOT 1.3* 1.0  PROT 8.0 7.2  ALBUMIN 3.0* 2.6*    Recent Labs Lab 02/10/14 1106  LIPASE 9*   No results for input(s): AMMONIA in the last 168 hours. CBC:  Recent Labs Lab 02/10/14 1106 02/10/14 1116 02/12/14 1120 02/12/14 1904 02/13/14 0103 02/13/14 0615  WBC 26.5*  --  17.9*  --  16.0*  --   NEUTROABS 24.4*  --   --   --   --   --  HGB 11.0* 12.9* 8.9* 8.3* 8.4* 9.0*  HCT 34.3* 38.0* 28.3* 26.1* 26.0* 28.4*  MCV 91.2  --  87.1  --  86.1  --   PLT 341  --  266  --  257  --    Cardiac Enzymes:  Recent Labs Lab 02/12/14 1904 02/13/14 0103 02/13/14 0615  TROPONINI <0.30 <0.30 <0.30   BNP (last 3 results)  Recent Labs  06/16/13 1554 06/17/13 0537 07/09/13 0555  PROBNP 3560.0* 3222.0* 3842.0*   CBG: No results for input(s): GLUCAP in the last 168 hours.  Recent Results (from the past 240 hour(s))  Blood culture (routine x 2)     Status: None (Preliminary result)   Collection Time: 02/10/14  1:50 PM  Result Value  Ref Range Status   Specimen Description BLOOD RIGHT ANTECUBITAL  Final   Special Requests BOTTLES DRAWN AEROBIC AND ANAEROBIC 6CC  Final   Culture NO GROWTH 2 DAYS  Final   Report Status PENDING  Incomplete  Blood culture (routine x 2)     Status: None (Preliminary result)   Collection Time: 02/10/14  2:10 PM  Result Value Ref Range Status   Specimen Description BLOOD RIGHT ARM  Final   Special Requests   Final    BOTTLES DRAWN AEROBIC AND ANAEROBIC AEB=8CC ANA=6CC   Culture NO GROWTH 2 DAYS  Final   Report Status PENDING  Incomplete     Studies: Dg Chest 2 View  02/13/2014   CLINICAL DATA:  Chest pain, cough, shortness of Breath  EXAM: CHEST  2 VIEW  COMPARISON:  02/11/2014  FINDINGS: Cardiomediastinal silhouette is stable. NG tube in place with tip in mid stomach. Contrast material from recent CT scan noted within left colon. Mild left basilar atelectasis. Mild degenerative changes thoracic spine. No acute infiltrate or pulmonary edema.  IMPRESSION: No acute infiltrate or pulmonary edema. Mild left basilar atelectasis. NG tube in place.   Electronically Signed   By: Lahoma Crocker M.D.   On: 02/13/2014 11:08   Dg Abd 1 View  02/11/2014   CLINICAL DATA:  Initial evaluation for abdominal distention  EXAM: ABDOMEN - 1 VIEW  COMPARISON:  02/10/2014 CT scan  FINDINGS: IV contrast is seen opacifying the bladder. There is significant gaseous distention of the stomach. There is mild small bowel dilatation in the midline to 4.6 cm. There is air and stool into the colon.  IMPRESSION: Findings most consistent with ileus. There is gaseous distention of the stomach.   Electronically Signed   By: Skipper Cliche M.D.   On: 02/11/2014 15:54   Dg Abd Portable 1v  02/12/2014   CLINICAL DATA:  NG tube placement  EXAM: PORTABLE ABDOMEN - 1 VIEW  COMPARISON:  04/2013  FINDINGS: NG tube has been inserted into the proximal stomach. Minimal decrease in gastric distention. Dilated small bowel persists in the mid  abdomen with a small bowel diameter of 5.8 cm. Contrast within the colon on the right. Heart appears enlarged. Left lower lobe streaky airspace process compatible with pneumonia or aspiration.  IMPRESSION: NGT within stomach. Minimal improving gastric distention and Stable small bowel distention.   Electronically Signed   By: Daryll Brod M.D.   On: 02/12/2014 14:33    Scheduled Meds: . sodium chloride   Intravenous Once  . allopurinol  300 mg Oral Daily  . antiseptic oral rinse  7 mL Mouth Rinse BID  . brimonidine  1 drop Both Eyes Q12H   And  . timolol  1  drop Both Eyes Q12H  . budesonide-formoterol  2 puff Inhalation BID  . diltiazem  180 mg Oral Daily  . latanoprost  1 drop Both Eyes QHS  . montelukast  10 mg Oral QHS  . pantoprazole (PROTONIX) IV  40 mg Intravenous Q12H  . piperacillin-tazobactam (ZOSYN)  IV  3.375 g Intravenous Q8H  . sodium chloride  3 mL Intravenous Q12H  . tiotropium  18 mcg Inhalation Daily  . vancomycin  1,000 mg Intravenous Q24H   Continuous Infusions: . sodium chloride 10 mL/hr at 02/11/14 1310  . sodium chloride 50 mL/hr at 02/12/14 1115     Time spent: > 35 minutes    [name]  [facility] Pager [phone]  If 7PM-7AM, please contact night-coverage at [email or web address], password [facility] 02/13/2014, 2:38 PM  LOS: 3 days

## 2014-02-13 NOTE — Progress Notes (Signed)
LOS: 3 days   Subjective: Reports intermittent episodes left anterior chest pain overnight as well as this morning. States pain improves when he puts his nasal cannula back on or in his mouth. Was seen by rapid response RN overnight for same. Also endorses persistent RLQ abdominal discomfort without associated N/V. Asks for something to drink reporting that he is thirsty. Intermittent nose bleeds overnight.    Objective: Vital signs in last 24 hours: Temp:  [97.6 F (36.4 C)-98.8 F (37.1 C)] 97.6 F (36.4 C) (12/04 0540) Pulse Rate:  [78-111] 111 (12/04 0540) Resp:  [14-19] 19 (12/04 0540) BP: (100-122)/(54-78) 122/78 mmHg (12/04 0540) SpO2:  [92 %-100 %] 100 % (12/04 0540) Weight:  [65.772 kg (145 lb)] 65.772 kg (145 lb) (12/04 0500) Last BM Date: 02/10/14 700 cc dark brown liquid from NGT over past 24 hours.  Afebrile No hypoxia  Laboratory  CBC  Recent Labs  02/12/14 1120  02/13/14 0103 02/13/14 0615  WBC 17.9*  --  16.0*  --   HGB 8.9*  < > 8.4* 9.0*  HCT 28.3*  < > 26.0* 28.4*  PLT 266  --  257  --   < > = values in this interval not displayed. BMET  Recent Labs  02/12/14 1120 02/13/14 0103  NA 138 140  K 4.1 4.0  CL 94* 96  CO2 30 28  GLUCOSE 86 94  BUN 37* 42*  CREATININE 1.75* 1.69*  CALCIUM 9.0 9.2     Physical Exam General appearance: alert, cooperative and no distress Throat: dry mucous membranes Resp: rhonchi bilaterally Cardio: irregularly irregular rhythm. No active chest discomfort at time of exam.  GI: moderate tenderness with palpation in RLQ, mild distention, scant BS, thin brown liquid from NGT   Assessment:  1. Perforated appendicitis with abscess 2. Ileus vs sbo secondary to #1 3. CKD 4. Anticoagulated for Afib. INR now 1.75 following FFP.  5. Leukocytosis 6. Chest discomfort 7. Anemia  Plan:  1. Appendicitis with abscess: Awaiting IR evaluation for possible drain placement. If patient worsens or unable to get a drain, he  will likely need surgical intervention. 2. Ileus vs SBO: continue NPO with NGT. Will add Protonix for stress gastritis given given appearance of NGT fluid output.  3. CKD: Cr stable at 1.69 4. Anticoagulation: Warfarin being held secondary to potential need for surgery. Will defer to IM if additional stroke prophylaxis needed. Need to proceed with caution given appearance of NGT output and decreased H/H since admission.  5. Leukocytosis: secondary to #1. now 16K. Continue IV Abx. Awaiting IR evaluation for drain placement. If patient worsens or unable to get a drain, he will likely need surgical intervention. 6.Chest discomfort: this patient may be experiencing angina secondary to anemia. At risk for underlying cardiovascular disease given age and hx of tobacco abuse. Stress test May 2015 reported as normal. Troponins x 2 thus far negative and ECG from overnight appears to be Afib with PVCs. Will repeat 12 lead this morning. Will order CXR given chest discomfort and bilateral rhonchi. Will discuss contnued management with IM. Would need this issue clarified should patient require surgical intervention.  7. Anemia: ?GI source. Overnight epistaxis seems to have resolved.     Lahoma Rocker, [facility] Pager [phone]  02/13/2014

## 2014-02-13 NOTE — Consult Note (Signed)
Primary Physician: Primary Cardiologist: [name]   HPI:  Asked to see re preop cardiac evalaution/risk stratification.    Patient is a 73 yo with a hsitory of chronic diastolic CHF, COPD, cor pulmonale, NSVT, pumonary nodules (bx neg, normal ACE), gout, CKD stage II, epistaxis and atrial fib He was lst in cardiology clinic in [place] 2015.  Had a nuclear stress test in May 2015 that showed inferior probable soft tissue attenuation vs scar.  No ischemia.   LVEF 50%     Echo in April LVEF 50 to 61%  Gr II diastolic dysfunction.  RVEF mod reduced.  PAP est at 74 mm Hg.    Patient went to [facility] on 12/1 with abdominal pain.  Found to have a ruptured appendix with abscess.  Tx to [facility]  .   He is difficult at times to understand with NG tube Notes on day of admit had abdominal pain that went from RLQ up toward chest  Severe Since admit he has had intermitt CP He wonders if due to NG tube. Had occasional Cp at home  Not frequent  No syncope  No PND  [facility] as [facility]        Past Medical History  Diagnosis Date  . Glaucoma     uses eye drops daily  . Allergic rhinitis     takes Singulair at bedtime  . Atrial fibrillation     takes Coumadin daily  . Diastolic heart failure     LVEF 95-09%, rate 2 diastolic dysfunction 05/2669  . Pulmonary hypertension     70 mmHg 03/2012  . Asthma     Albuterol neb and inhaler as needed  . Anxiety     takes Xanax daily as needed  . COPD (chronic obstructive pulmonary disease)     Symbicort daily  . Essential hypertension, benign     takes Metoprolol and Diltiazem daily  . Anemia     takes ferrous sulfate daily  . History of bronchitis   . Shortness of breath     with exertion  . Numbness     fingertips on both hands  . Lung nodules   . History of blood transfusion     Medications Prior to Admission  Medication Sig Dispense Refill  . albuterol (PROAIR HFA) 108 (90 BASE) MCG/ACT inhaler Inhale 2 puffs into the lungs  every 6 (six) hours as needed for wheezing or shortness of breath. 6.7 g 4  . albuterol (PROVENTIL) (2.5 MG/3ML) 0.083% nebulizer solution Take 2.5 mg by nebulization every 8 (eight) hours as needed for wheezing or shortness of breath.    . allopurinol (ZYLOPRIM) 300 MG tablet Take 1 tablet (300 mg total) by mouth daily. 30 tablet 6  . ALPRAZolam (XANAX) 0.25 MG tablet Take 0.25 mg by mouth daily as needed for anxiety.    . brimonidine-timolol (COMBIGAN) 0.2-0.5 % ophthalmic solution Place 1 drop into both eyes every 12 (twelve) hours.    . budesonide-formoterol (SYMBICORT) 160-4.5 MCG/ACT inhaler Inhale 2 puffs into the lungs 2 (two) times daily. 1 Inhaler 4  . diltiazem (CARDIZEM CD) 180 MG 24 hr capsule Take 1 capsule (180 mg total) by mouth daily. 30 capsule 6  . mirtazapine (REMERON) 15 MG tablet Take 1 tablet (15 mg total) by mouth at bedtime. 30 tablet 4  . montelukast (SINGULAIR) 10 MG tablet Take 10 mg by mouth at bedtime.    . potassium chloride (K-DUR) 10 MEQ tablet Take  10 mEq by mouth 2 (two) times daily.     Marland Kitchen tiotropium (SPIRIVA) 18 MCG inhalation capsule Place 18 mcg into inhaler and inhale daily.    Marland Kitchen torsemide (DEMADEX) 20 MG tablet Take 2 tablets (40 mg total) by mouth daily. 60 tablet 3  . traMADol (ULTRAM) 50 MG tablet Take 50 mg by mouth daily as needed (pain).    Marland Kitchen travoprost, benzalkonium, (TRAVATAN) 0.004 % ophthalmic solution Place 1 drop into both eyes at bedtime.    Derrill Memo ON 03/13/2014] traZODone (DESYREL) 50 MG tablet Take 0.5-1 tablets (25-50 mg total) by mouth at bedtime as needed for sleep. 30 tablet 5  . warfarin (COUMADIN) 4 MG tablet Take 1.5 tabs (6 mg) on Monday and Thursday and 1 tab (46m) on other days (Patient taking differently: Take 4-6 mg by mouth daily. Take 1.5 tabs (6 mg) on Monday and Thursday and 1 tab (467m on other days) 35 tablet 3  . albuterol (PROVENTIL) (2.5 MG/3ML) 0.083% nebulizer solution INHALE THE CONTENTS OF 1 VIAL USING THE NEBULIZER EVERY  6 HOURS AS NEEDED (Patient not taking: Reported on 02/10/2014) 375 mL 0  . traMADol (ULTRAM) 50 MG tablet TAKE 1 TABLET BY MOUTH EVERY DAY AS NEEDED (Patient not taking: Reported on 02/10/2014) 30 tablet 3     . sodium chloride   Intravenous Once  . allopurinol  300 mg Oral Daily  . antiseptic oral rinse  7 mL Mouth Rinse BID  . brimonidine  1 drop Both Eyes Q12H   And  . timolol  1 drop Both Eyes Q12H  . budesonide-formoterol  2 puff Inhalation BID  . diltiazem  180 mg Oral Daily  . latanoprost  1 drop Both Eyes QHS  . montelukast  10 mg Oral QHS  . pantoprazole (PROTONIX) IV  40 mg Intravenous Q12H  . piperacillin-tazobactam (ZOSYN)  IV  3.375 g Intravenous Q8H  . sodium chloride  3 mL Intravenous Q12H  . tiotropium  18 mcg Inhalation Daily  . vancomycin  1,000 mg Intravenous Q24H    Infusions: . dextrose 5 % and 0.45 % NaCl with KCl 20 mEq/L      Allergies  Allergen Reactions  . Aspirin Other (See Comments)    On coumadin    History   Social History  . Marital Status: Widowed    Spouse Name: N/A    Number of Children: 2  . Years of Education: N/A   Occupational History  . Retired   .     Social History Main Topics  . Smoking status: Current Some Day Smoker -- 0.50 packs/day for 60 years    Types: Cigarettes  . Smokeless tobacco: Never Used  . Alcohol Use: No  . Drug Use: No  . Sexual Activity: No   Other Topics Concern  . Not on file   Social History Narrative    Family History  Problem Relation Age of Onset  . Cancer Mother   . Cancer Father   . Coronary artery disease Brother   . Colon cancer Brother     REVIEW OF SYSTEMS:  All systems reviewed  Negative to the above problem except as noted above.    PHYSICAL EXAM: Filed Vitals:   02/13/14 1330  BP: 130/68  Pulse: 60  Temp: 97.7 F (36.5 C)  Resp: 16     Intake/Output Summary (Last 24 hours) at 02/13/14 1622 Last data filed at 02/13/14 1300  Gross per 24 hour  Intake  608 ml    Output   1575 ml  Net   -967 ml    General:  Frail appearing 73 yo  On O2 HEENT: NG tube Neck: supple. no JVD. Carotids 2+ bilat; no bruits. No lymphadenopathy or thryomegaly appreciated. Cor: Irreg irreg.  Increased RV impulse.  No signif murmurs   Lungs: marked decreased airflow   Abdomen:Mild RUQ tenderness   Increased discomfort in lower quadrants  Decreased BS   Extremities: no cyanosis, clubbing, rash, edema Neuro: alert & oriented x 3, cranial nerves grossly intact. moves all 4 extremities w/o difficulty. Affect pleasant.  ECG:  ATrial fib with frequent PVC  83 bpm  Early transition  IWMI.    Results for orders placed or performed during the hospital encounter of 02/10/14 (from the past 24 hour(s))  Hemoglobin and hematocrit, blood     Status: Abnormal   Collection Time: 02/12/14  7:04 PM  Result Value Ref Range   Hemoglobin 8.3 (L) 13.0 - 17.0 g/dL   HCT 26.1 (L) 39.0 - 52.0 %  Troponin I (q 6hr x 3)     Status: None   Collection Time: 02/12/14  7:04 PM  Result Value Ref Range   Troponin I <0.30 <0.30 ng/mL  Troponin I (q 6hr x 3)     Status: None   Collection Time: 02/13/14  1:03 AM  Result Value Ref Range   Troponin I <0.30 <0.30 ng/mL  Protime-INR     Status: Abnormal   Collection Time: 02/13/14  1:03 AM  Result Value Ref Range   Prothrombin Time 20.6 (H) 11.6 - 15.2 seconds   INR 1.75 (H) 0.00 - 3.71  Basic metabolic panel     Status: Abnormal   Collection Time: 02/13/14  1:03 AM  Result Value Ref Range   Sodium 140 137 - 147 mEq/L   Potassium 4.0 3.7 - 5.3 mEq/L   Chloride 96 96 - 112 mEq/L   CO2 28 19 - 32 mEq/L   Glucose, Bld 94 70 - 99 mg/dL   BUN 42 (H) 6 - 23 mg/dL   Creatinine, Ser 1.69 (H) 0.50 - 1.35 mg/dL   Calcium 9.2 8.4 - 10.5 mg/dL   GFR calc non Af Amer 38 (L) >90 mL/min   GFR calc Af Amer 44 (L) >90 mL/min   Anion gap 16 (H) 5 - 15  CBC     Status: Abnormal   Collection Time: 02/13/14  1:03 AM  Result Value Ref Range   WBC 16.0 (H) 4.0  - 10.5 K/uL   RBC 3.02 (L) 4.22 - 5.81 MIL/uL   Hemoglobin 8.4 (L) 13.0 - 17.0 g/dL   HCT 26.0 (L) 39.0 - 52.0 %   MCV 86.1 78.0 - 100.0 fL   MCH 27.8 26.0 - 34.0 pg   MCHC 32.3 30.0 - 36.0 g/dL   RDW 16.3 (H) 11.5 - 15.5 %   Platelets 257 150 - 400 K/uL  Hemoglobin and hematocrit, blood     Status: Abnormal   Collection Time: 02/13/14  6:15 AM  Result Value Ref Range   Hemoglobin 9.0 (L) 13.0 - 17.0 g/dL   HCT 28.4 (L) 39.0 - 52.0 %  Troponin I     Status: None   Collection Time: 02/13/14  6:15 AM  Result Value Ref Range   Troponin I <0.30 <0.30 ng/mL   Dg Chest 2 View  02/13/2014   CLINICAL DATA:  Chest pain, cough, shortness of Breath  EXAM: CHEST  2 VIEW  COMPARISON:  02/11/2014  FINDINGS: Cardiomediastinal silhouette is stable. NG tube in place with tip in mid stomach. Contrast material from recent CT scan noted within left colon. Mild left basilar atelectasis. Mild degenerative changes thoracic spine. No acute infiltrate or pulmonary edema.  IMPRESSION: No acute infiltrate or pulmonary edema. Mild left basilar atelectasis. NG tube in place.   Electronically Signed   By: Lahoma Crocker M.D.   On: 02/13/2014 11:08   Dg Abd Portable 1v  02/12/2014   CLINICAL DATA:  NG tube placement  EXAM: PORTABLE ABDOMEN - 1 VIEW  COMPARISON:  04/2013  FINDINGS: NG tube has been inserted into the proximal stomach. Minimal decrease in gastric distention. Dilated small bowel persists in the mid abdomen with a small bowel diameter of 5.8 cm. Contrast within the colon on the right. Heart appears enlarged. Left lower lobe streaky airspace process compatible with pneumonia or aspiration.  IMPRESSION: NGT within stomach. Minimal improving gastric distention and Stable small bowel distention.   Electronically Signed   By: Daryll Brod M.D.   On: 02/12/2014 14:33     ASSESSMENT: Patient is a 73 yo who is being treated for a ruptured appendix  Ask to evaluate cardiac risks for possible surgery Patient has severe  COPD with cor pulmonale.  Last echo in April 2015 RVEF was moderately depressed and PAP est at 74 mm HG  LVEF was normal   Myoview scan showed no ischemia in May.  The patient denied CP prior to admit  Did have some high abdominal pain/epigastric pain  ON exam, pt on oxygen chronically  Signif decreased airflow  RV impulse prominent.  No edema Remains in Afib chronically  Rates a little high    Overall I think Mr Theodore [name] is at increased risk for cardiac and pulmonary complications if surgery planned  because of his severe lung disease and resultant pulmonary HTN/ cor pulmonale     I am not convinced of any active coronary ischemia (neg stress test 6 months ago)     I would get an echo to reeval LVEF and RVEF I think pulmonary should be contacted to evaluate the patient as well if surgery is being considered   2.  Afib  Rates have been a little high today  Would follow  Limited since NPO  Would need IV dilt if worsens.  Off anticoagulatioin

## 2014-02-13 NOTE — Care Management (Signed)
IM given. Magdalen Spatz RN BSN

## 2014-02-14 ENCOUNTER — Encounter (HOSPITAL_COMMUNITY): Payer: Self-pay | Admitting: Radiology

## 2014-02-14 ENCOUNTER — Inpatient Hospital Stay (HOSPITAL_COMMUNITY): Payer: Medicare Other

## 2014-02-14 DIAGNOSIS — L0291 Cutaneous abscess, unspecified: Secondary | ICD-10-CM

## 2014-02-14 DIAGNOSIS — I482 Chronic atrial fibrillation: Secondary | ICD-10-CM

## 2014-02-14 LAB — COMPREHENSIVE METABOLIC PANEL
ALK PHOS: 77 U/L (ref 39–117)
ALT: 7 U/L (ref 0–53)
AST: 19 U/L (ref 0–37)
Albumin: 2.4 g/dL — ABNORMAL LOW (ref 3.5–5.2)
Anion gap: 15 (ref 5–15)
BUN: 56 mg/dL — ABNORMAL HIGH (ref 6–23)
CALCIUM: 9.3 mg/dL (ref 8.4–10.5)
CO2: 29 mEq/L (ref 19–32)
Chloride: 100 mEq/L (ref 96–112)
Creatinine, Ser: 1.8 mg/dL — ABNORMAL HIGH (ref 0.50–1.35)
GFR calc Af Amer: 41 mL/min — ABNORMAL LOW (ref >90)
GFR calc non Af Amer: 35 mL/min — ABNORMAL LOW (ref >90)
Glucose, Bld: 116 mg/dL — ABNORMAL HIGH (ref 70–99)
Potassium: 4.1 mEq/L (ref 3.7–5.3)
SODIUM: 144 meq/L (ref 137–147)
TOTAL PROTEIN: 6.8 g/dL (ref 6.0–8.3)
Total Bilirubin: 1.2 mg/dL (ref 0.3–1.2)

## 2014-02-14 LAB — CBC
HCT: 26.3 % — ABNORMAL LOW (ref 39.0–52.0)
HEMATOCRIT: 27.3 % — AB (ref 39.0–52.0)
Hemoglobin: 8.7 g/dL — ABNORMAL LOW (ref 13.0–17.0)
Hemoglobin: 8.7 g/dL — ABNORMAL LOW (ref 13.0–17.0)
MCH: 26.9 pg (ref 26.0–34.0)
MCH: 28.6 pg (ref 26.0–34.0)
MCHC: 31.9 g/dL (ref 30.0–36.0)
MCHC: 33.1 g/dL (ref 30.0–36.0)
MCV: 84.5 fL (ref 78.0–100.0)
MCV: 86.5 fL (ref 78.0–100.0)
PLATELETS: 276 10*3/uL (ref 150–400)
Platelets: 257 10*3/uL (ref 150–400)
RBC: 3.04 MIL/uL — ABNORMAL LOW (ref 4.22–5.81)
RBC: 3.23 MIL/uL — ABNORMAL LOW (ref 4.22–5.81)
RDW: 16.2 % — ABNORMAL HIGH (ref 11.5–15.5)
RDW: 16.5 % — ABNORMAL HIGH (ref 11.5–15.5)
WBC: 17.2 10*3/uL — ABNORMAL HIGH (ref 4.0–10.5)
WBC: 17.6 10*3/uL — ABNORMAL HIGH (ref 4.0–10.5)

## 2014-02-14 LAB — HEMOGLOBIN AND HEMATOCRIT, BLOOD
HEMATOCRIT: 27.3 % — AB (ref 39.0–52.0)
Hemoglobin: 8.9 g/dL — ABNORMAL LOW (ref 13.0–17.0)

## 2014-02-14 LAB — PROTIME-INR
INR: 1.34 (ref 0.00–1.49)
PROTHROMBIN TIME: 16.7 s — AB (ref 11.6–15.2)

## 2014-02-14 MED ORDER — WHITE PETROLATUM GEL
Status: AC
  Administered 2014-02-14: 0.2
  Filled 2014-02-14: qty 5

## 2014-02-14 MED ORDER — POTASSIUM CHLORIDE 10 MEQ/100ML IV SOLN
INTRAVENOUS | Status: AC
  Filled 2014-02-14: qty 100

## 2014-02-14 MED ORDER — IOHEXOL 300 MG/ML  SOLN
25.0000 mL | INTRAMUSCULAR | Status: AC
  Administered 2014-02-14 (×2): 25 mL via ORAL

## 2014-02-14 NOTE — Progress Notes (Signed)
[facility] PROGRESS NOTE  Gareld Obrecht Begley HBZ:[id number] DOB: 04-17-1940 DOA: 02/10/2014 PCP: Tula Nakayama, MD  Assessment/Plan:  1. Perforated appendix with abscess/sepsis; CT: Findings are concerning for ruptured appendicitis with an associated approximately 4.5 x 3.2 cm abscess  -Awaiting further recommendations from surgery - Cardiology consulted for surgical clearance, currently no plans for further cardiac testing - Given history of COPD with oxygen dependence agree with pulmonology consultation for preoperative management  2. CHF, diastolic HF; echo (1017): LVEF 51%, diastolic dysfunction, pulmonary HTN PASP 74, TR -thought mild hypervolemic at [facility], given lasix; currently clinically stable; gentle IVF while NPO; hold lasix; use prn; monitor I/O, daily weight, fluid balance  -gentle MIVF while NPO; NG  3. COPD on home oxygen; no wheezing on exam; cont supplemental oxygen, bronchodilators as needed   4. CT: Bilateral lower lobe bronchiectatic changes and consolidation are suspicious for chronic aspiration pneumonitis -cont NG for now; needs SLP eval when stable; continue IV abx;   5. A. fib on Coumadin; HR is stable; coumadin on hold for procedure. INR currently trending down  6. AKI on CKD II in the setting of sepsis; likely ATN;  -gentle IVF, close monitor I/O, daily weight; urine output   7. Acute anemia - stable with last value at 8.7  Code Status: stable Family Communication: None at bedside Disposition Plan: Pending recommendations from specialist involved   Consultants:  General surgery  IR  Cardiology  Procedures:  pending  Antibiotics:  Vancomycin and zosyn  HPI/Subjective: Pt has no new complaints.  Objective: Filed Vitals:   02/14/14 0548  BP: 127/75  Pulse: 78  Temp: 97.6 F (36.4 C)  Resp: 18    Intake/Output Summary (Last 24 hours) at 02/14/14 1342 Last data filed at 02/14/14 0800  Gross per 24 hour  Intake   1000  ml  Output    425 ml  Net    575 ml   Filed Weights   02/11/14 0548 02/13/14 0500 02/14/14 0548  Weight: 63.594 kg (140 lb 3.2 oz) 65.772 kg (145 lb) 76.386 kg (168 lb 6.4 oz)    Exam:   General:  Pt in nad, alert and awake  Cardiovascular: s1 and s2 present, no mrg  Respiratory: cta bl, no wheezes  Abdomen: soft, pain on palpation , no guarding  Musculoskeletal: no clubbing or cyanosis   Data Reviewed: Basic Metabolic Panel:  Recent Labs Lab 02/10/14 1106 02/10/14 1116 02/11/14 0554 02/12/14 1120 02/13/14 0103 02/14/14 0813  NA 138 136* 138 138 140 144  K 5.2 4.8 4.4 4.1 4.0 4.1  CL 93* 94* 94* 94* 96 100  CO2 30  --  34* 30 28 29   GLUCOSE 139* 132* 130* 86 94 116*  BUN 25* 32* 29* 37* 42* 56*  CREATININE 1.55* 1.60* 1.70* 1.75* 1.69* 1.80*  CALCIUM 8.9  --  9.0 9.0 9.2 9.3   Liver Function Tests:  Recent Labs Lab 02/10/14 1106 02/11/14 0554 02/14/14 0813  AST 18 15 19   ALT 6 6 7   ALKPHOS 127* 98 77  BILITOT 1.3* 1.0 1.2  PROT 8.0 7.2 6.8  ALBUMIN 3.0* 2.6* 2.4*    Recent Labs Lab 02/10/14 1106  LIPASE 9*   No results for input(s): AMMONIA in the last 168 hours. CBC:  Recent Labs Lab 02/10/14 1106  02/12/14 1120  02/13/14 0103 02/13/14 0615 02/13/14 1933 02/13/14 2350 02/14/14 0813  WBC 26.5*  --  17.9*  --  16.0*  --   --  17.6*  17.2*  NEUTROABS 24.4*  --   --   --   --   --   --   --   --   HGB 11.0*  < > 8.9*  < > 8.4* 9.0* 9.2* 8.7* 8.7*  HCT 34.3*  < > 28.3*  < > 26.0* 28.4* 28.3* 26.3* 27.3*  MCV 91.2  --  87.1  --  86.1  --   --  86.5 84.5  PLT 341  --  266  --  257  --   --  276 257  < > = values in this interval not displayed. Cardiac Enzymes:  Recent Labs Lab 02/12/14 1904 02/13/14 0103 02/13/14 0615  TROPONINI <0.30 <0.30 <0.30   BNP (last 3 results)  Recent Labs  06/16/13 1554 06/17/13 0537 07/09/13 0555  PROBNP 3560.0* 3222.0* 3842.0*   CBG: No results for input(s): GLUCAP in the last 168  hours.  Recent Results (from the past 240 hour(s))  Blood culture (routine x 2)     Status: None (Preliminary result)   Collection Time: 02/10/14  1:50 PM  Result Value Ref Range Status   Specimen Description BLOOD RIGHT ANTECUBITAL  Final   Special Requests BOTTLES DRAWN AEROBIC AND ANAEROBIC 6CC  Final   Culture   Final    GRAM NEGATIVE RODS Gram Stain Report Called to,Read Back By and Verified With: [email or web address] AT 2319 ON 02/13/14 BY S.VANHOORNE    Report Status PENDING  Incomplete  Blood culture (routine x 2)     Status: None (Preliminary result)   Collection Time: 02/10/14  2:10 PM  Result Value Ref Range Status   Specimen Description BLOOD RIGHT ARM  Final   Special Requests   Final    BOTTLES DRAWN AEROBIC AND ANAEROBIC AEB=8CC ANA=6CC   Culture NO GROWTH 4 DAYS  Final   Report Status PENDING  Incomplete     Studies: Dg Chest 2 View  02/13/2014   CLINICAL DATA:  Chest pain, cough, shortness of Breath  EXAM: CHEST  2 VIEW  COMPARISON:  02/11/2014  FINDINGS: Cardiomediastinal silhouette is stable. NG tube in place with tip in mid stomach. Contrast material from recent CT scan noted within left colon. Mild left basilar atelectasis. Mild degenerative changes thoracic spine. No acute infiltrate or pulmonary edema.  IMPRESSION: No acute infiltrate or pulmonary edema. Mild left basilar atelectasis. NG tube in place.   Electronically Signed   By: Lahoma Crocker M.D.   On: 02/13/2014 11:08   Dg Abd Portable 1v  02/12/2014   CLINICAL DATA:  NG tube placement  EXAM: PORTABLE ABDOMEN - 1 VIEW  COMPARISON:  04/2013  FINDINGS: NG tube has been inserted into the proximal stomach. Minimal decrease in gastric distention. Dilated small bowel persists in the mid abdomen with a small bowel diameter of 5.8 cm. Contrast within the colon on the right. Heart appears enlarged. Left lower lobe streaky airspace process compatible with pneumonia or aspiration.  IMPRESSION: NGT within stomach. Minimal improving  gastric distention and Stable small bowel distention.   Electronically Signed   By: Daryll Brod M.D.   On: 02/12/2014 14:33    Scheduled Meds: . sodium chloride   Intravenous Once  . allopurinol  300 mg Oral Daily  . antiseptic oral rinse  7 mL Mouth Rinse BID  . brimonidine  1 drop Both Eyes Q12H   And  . timolol  1 drop Both Eyes Q12H  . budesonide-formoterol  2 puff Inhalation BID  . diltiazem  180 mg Oral Daily  . latanoprost  1 drop Both Eyes QHS  . montelukast  10 mg Oral QHS  . pantoprazole (PROTONIX) IV  40 mg Intravenous Q12H  . piperacillin-tazobactam (ZOSYN)  IV  3.375 g Intravenous Q8H  . potassium chloride      . potassium chloride      . sodium chloride  3 mL Intravenous Q12H  . tiotropium  18 mcg Inhalation Daily  . vancomycin  1,000 mg Intravenous Q24H   Continuous Infusions: . dextrose 5 % and 0.45 % NaCl with KCl 20 mEq/L 50 mL/hr at 02/13/14 1500     Time spent: > 35 minutes    [name]  [facility] Pager [phone]  If 7PM-7AM, please contact night-coverage at [email or web address], password [facility] 02/14/2014, 1:42 PM  LOS: 4 days

## 2014-02-14 NOTE — Progress Notes (Signed)
Received blood culture report from [facility] lab for gram negative rod.

## 2014-02-14 NOTE — Progress Notes (Signed)
Ct completed today:  right lower quadrant, there is a slightly larger fluid collection with tiny gas bubbles inferior to the cecum measuring 5.1 x 4.1 cm. This would be amenable to percutaneous drainage. Slight thickening of the adjacent small bowel, suspect reactive enteritis. Findings compatible with perforated appendicitis and right lower quadrant abscess. IR ask to see and place drain, he is off coumadin, recheck labs in AM, OOB to chair, I worked on his NG, and starting flutter valve, he sounds pretty congested.

## 2014-02-14 NOTE — Progress Notes (Signed)
Subjective: Alert and cooperative. Deconditioned and feeble. Cough is productive He states his abdominal pain is better next line he states he's had 2 bowel movements. NG output low.  WBC 17,600, hemoglobin 8.7. INR 1.75 yesterday. No results today. Creatinine 1.69 yesterday. No results today  Appreciate cardiac evaluation. As expected, he is at increased risk due to severe home O2 dependent COPD. Chest pain here atypical. Enzymes negative. No further cardiac evaluation. On Cardizem for atrial fibrillation rate control. Next on Coumadin on hold next line holding diuretics because of acute renal failure.  Objective: Vital signs in last 24 hours: Temp:  [97.6 F (36.4 C)-98.6 F (37 C)] 97.6 F (36.4 C) (12/05 0548) Pulse Rate:  [60-96] 78 (12/05 0548) Resp:  [16-18] 18 (12/05 0548) BP: (127-135)/(68-75) 127/75 mmHg (12/05 0548) SpO2:  [75 %-100 %] 96 % (12/05 0612) FiO2 (%):  [2 %] 2 % (12/05 0612) Weight:  [168 lb 6.4 oz (76.386 kg)] 168 lb 6.4 oz (76.386 kg) (12/05 0548) Last BM Date: 02/10/14  Intake/Output from previous day: 12/04 0701 - 12/05 0700 In: 1000 [I.V.:1000] Out: 625 [Urine:425; Emesis/NG output:200] Intake/Output this shift:    General appearance: Alert and cooperative. Thin and feeble. Deconditioned appropriate Resp: Some rhonchi noted. GI: Tender right lower quadrant. I do not appreciate a mass. Soft elsewhere. A little bit distended.  Lab Results:   Recent Labs  02/13/14 0103  02/13/14 1933 02/13/14 2350  WBC 16.0*  --   --  17.6*  HGB 8.4*  < > 9.2* 8.7*  HCT 26.0*  < > 28.3* 26.3*  PLT 257  --   --  276  < > = values in this interval not displayed. BMET  Recent Labs  02/12/14 1120 02/13/14 0103  NA 138 140  K 4.1 4.0  CL 94* 96  CO2 30 28  GLUCOSE 86 94  BUN 37* 42*  CREATININE 1.75* 1.69*  CALCIUM 9.0 9.2   PT/INR  Recent Labs  02/13/14 0103  LABPROT 20.6*  INR 1.75*   ABG No results for input(s): PHART, HCO3 in the  last 72 hours.  Invalid input(s): PCO2, PO2  Studies/Results: Dg Chest 2 View  02/13/2014   CLINICAL DATA:  Chest pain, cough, shortness of Breath  EXAM: CHEST  2 VIEW  COMPARISON:  02/11/2014  FINDINGS: Cardiomediastinal silhouette is stable. NG tube in place with tip in mid stomach. Contrast material from recent CT scan noted within left colon. Mild left basilar atelectasis. Mild degenerative changes thoracic spine. No acute infiltrate or pulmonary edema.  IMPRESSION: No acute infiltrate or pulmonary edema. Mild left basilar atelectasis. NG tube in place.   Electronically Signed   By: Lahoma Crocker M.D.   On: 02/13/2014 11:08   Dg Abd Portable 1v  02/12/2014   CLINICAL DATA:  NG tube placement  EXAM: PORTABLE ABDOMEN - 1 VIEW  COMPARISON:  04/2013  FINDINGS: NG tube has been inserted into the proximal stomach. Minimal decrease in gastric distention. Dilated small bowel persists in the mid abdomen with a small bowel diameter of 5.8 cm. Contrast within the colon on the right. Heart appears enlarged. Left lower lobe streaky airspace process compatible with pneumonia or aspiration.  IMPRESSION: NGT within stomach. Minimal improving gastric distention and Stable small bowel distention.   Electronically Signed   By: Daryll Brod M.D.   On: 02/12/2014 14:33    Anti-infectives: Anti-infectives    Start     Dose/Rate Route Frequency Ordered Stop   02/12/14 2000  piperacillin-tazobactam (ZOSYN) IVPB 3.375 g     3.375 g12.5 mL/hr over 240 Minutes Intravenous Every 8 hours 02/12/14 1753     02/12/14 2000  vancomycin (VANCOCIN) IVPB 1000 mg/200 mL premix     1,000 mg200 mL/hr over 60 Minutes Intravenous Every 24 hours 02/12/14 1753     02/11/14 1700  vancomycin (VANCOCIN) IVPB 1000 mg/200 mL premix  Status:  Discontinued     1,000 mg200 mL/hr over 60 Minutes Intravenous Every 24 hours 02/10/14 1607 02/12/14 1753   02/10/14 2200  piperacillin-tazobactam (ZOSYN) IVPB 3.375 g  Status:  Discontinued     3.375  g12.5 mL/hr over 240 Minutes Intravenous Every 8 hours 02/10/14 1602 02/12/14 1753   02/10/14 1700  vancomycin (VANCOCIN) 1,250 mg in sodium chloride 0.9 % 250 mL IVPB     1,250 mg166.7 mL/hr over 90 Minutes Intravenous  Once 02/10/14 1605 02/10/14 1934   02/10/14 1345  piperacillin-tazobactam (ZOSYN) IVPB 3.375 g     3.375 g100 mL/hr over 30 Minutes Intravenous  Once 02/10/14 1344 02/10/14 1452      Assessment/Plan:  Perforated appendicitis with abscess. Due to significant risk and comorbidities, will repeat CT scan abdomen today to see if abscess has now become drainable. Continue vancomycin and Zosyn  Ileus versus SBO secondary to appendicitis  CKD  Anticoagulated for A. fib. Holding Coumadin. Repeat INR  Atypical chest pain  Oxygen dependent COPD   LOS: 4 days    Hever Castilleja M 02/14/2014

## 2014-02-14 NOTE — Plan of Care (Signed)
Problem: Phase I Progression Outcomes Goal: Pain controlled with appropriate interventions Outcome: Completed/Met Date Met:  02/14/14 Goal: OOB as tolerated unless otherwise ordered Outcome: Completed/Met Date Met:  02/14/14 Goal: Initial discharge plan identified Outcome: Not Applicable Date Met:  63/86/85 Goal: Hemodynamically stable Outcome: Completed/Met Date Met:  02/14/14 Goal: Other Phase I Outcomes/Goals Outcome: Not Applicable Date Met:  48/83/01

## 2014-02-14 NOTE — Progress Notes (Signed)
    Subjective:  Denies dyspnea; chest pain intermittently radiating from abdomen   Objective:  Filed Vitals:   02/13/14 1330 02/13/14 2220 02/14/14 0548 02/14/14 0612  BP: 130/68 135/72 127/75   Pulse: 60 96 78   Temp: 97.7 F (36.5 C) 98.6 F (37 C) 97.6 F (36.4 C)   TempSrc: Oral Oral Oral   Resp: 16 18 18    Height:      Weight:   168 lb 6.4 oz (76.386 kg)   SpO2: 93% 100% 75% 96%    Intake/Output from previous day:  Intake/Output Summary (Last 24 hours) at 02/14/14 0738 Last data filed at 02/14/14 0556  Gross per 24 hour  Intake   1000 ml  Output    625 ml  Net    375 ml    Physical Exam: Physical exam: Well-developed chronically ill appearing in no acute distress.  Skin is warm and dry.  HEENT is normal.  Neck is supple.  Chest is clear to auscultation with normal expansion.  Cardiovascular exam is irregular Abdominal exam mild tenderness to palpation Extremities show no edema. neuro grossly intact    Lab Results: Basic Metabolic Panel:  Recent Labs  02/12/14 1120 02/13/14 0103  NA 138 140  K 4.1 4.0  CL 94* 96  CO2 30 28  GLUCOSE 86 94  BUN 37* 42*  CREATININE 1.75* 1.69*  CALCIUM 9.0 9.2   CBC:  Recent Labs  02/13/14 0103  02/13/14 1933 02/13/14 2350  WBC 16.0*  --   --  17.6*  HGB 8.4*  < > 9.2* 8.7*  HCT 26.0*  < > 28.3* 26.3*  MCV 86.1  --   --  86.5  PLT 257  --   --  276  < > = values in this interval not displayed. Cardiac Enzymes:  Recent Labs  02/12/14 1904 02/13/14 0103 02/13/14 0615  TROPONINI <0.30 <0.30 <0.30     Assessment/Plan:  1 Preoperative evaluation prior to appendectomy-Patient will be at increased risk for surgery given baseline medical problems including severe home O2 dependent COPD. Nuclear study in May 2015 showed no ischemia and ejection fraction 50%. His chest pain here is atypical. Enzymes are negative. Would not pursue further cardiac evaluation preoperatively. 2 Severe COPD-own home  O2-recommend pulmonary to follow and preoperative evaluation. 3 atrial fibrillation-continue Cardizem for rate control. Can change to IV if he cannot take oral medications. Coumadin on hold. Resume anticoagulation postoperatively once okay with surgery. 4 acute renal failure-BUN and creatinine increased. Would continue to hold diuretics. Most likely from nothing by mouth status. 5 anemia-further evaluation per primary service. 6 ruptured appendix with abscess-continue antibiotics. General surgery following.  Theodore [name] 02/14/2014, 7:38 AM

## 2014-02-15 ENCOUNTER — Encounter (HOSPITAL_COMMUNITY): Payer: Self-pay | Admitting: Radiology

## 2014-02-15 ENCOUNTER — Inpatient Hospital Stay (HOSPITAL_COMMUNITY): Payer: Medicare Other

## 2014-02-15 DIAGNOSIS — K651 Peritoneal abscess: Secondary | ICD-10-CM

## 2014-02-15 DIAGNOSIS — IMO0002 Reserved for concepts with insufficient information to code with codable children: Secondary | ICD-10-CM

## 2014-02-15 LAB — HEMOGLOBIN AND HEMATOCRIT, BLOOD
HCT: 24.9 % — ABNORMAL LOW (ref 39.0–52.0)
HEMOGLOBIN: 8.1 g/dL — AB (ref 13.0–17.0)

## 2014-02-15 LAB — CULTURE, BLOOD (ROUTINE X 2): Culture: NO GROWTH

## 2014-02-15 LAB — PROTIME-INR
INR: 1.27 (ref 0.00–1.49)
Prothrombin Time: 16.1 seconds — ABNORMAL HIGH (ref 11.6–15.2)

## 2014-02-15 LAB — VANCOMYCIN, TROUGH: VANCOMYCIN TR: 19.3 ug/mL (ref 10.0–20.0)

## 2014-02-15 MED ORDER — FENTANYL CITRATE 0.05 MG/ML IJ SOLN
INTRAMUSCULAR | Status: AC | PRN
  Administered 2014-02-15: 25 ug via INTRAVENOUS

## 2014-02-15 MED ORDER — MIDAZOLAM HCL 2 MG/2ML IJ SOLN
INTRAMUSCULAR | Status: AC | PRN
  Administered 2014-02-15: 0.5 mg via INTRAVENOUS

## 2014-02-15 MED ORDER — MIDAZOLAM HCL 2 MG/2ML IJ SOLN
INTRAMUSCULAR | Status: AC
  Filled 2014-02-15: qty 4

## 2014-02-15 MED ORDER — FENTANYL CITRATE 0.05 MG/ML IJ SOLN
INTRAMUSCULAR | Status: AC
  Filled 2014-02-15: qty 4

## 2014-02-15 MED ORDER — LIDOCAINE HCL 1 % IJ SOLN
INTRAMUSCULAR | Status: AC
  Filled 2014-02-15: qty 20

## 2014-02-15 MED ORDER — HEPARIN SODIUM (PORCINE) 5000 UNIT/ML IJ SOLN
5000.0000 [IU] | Freq: Three times a day (TID) | INTRAMUSCULAR | Status: DC
  Administered 2014-02-15 – 2014-02-18 (×10): 5000 [IU] via SUBCUTANEOUS
  Filled 2014-02-15 (×11): qty 1

## 2014-02-15 NOTE — Progress Notes (Signed)
Subjective: Labs not done this AM They came for labs at 11:30, so I canceled for today and will recheck in the AM. NG was not working and i worked on it. Passing flatus Objective: Vital signs in last 24 hours: Temp:  [97.5 F (36.4 C)-98 F (36.7 C)] 97.5 F (36.4 C) (12/06 0538) Pulse Rate:  [84-88] 87 (12/06 0934) Resp:  [18-22] 22 (12/06 0934) BP: (120-128)/(68-78) 128/78 mmHg (12/06 0934) SpO2:  [90 %-94 %] 93 % (12/06 0934) Weight:  [67.223 kg (148 lb 3.2 oz)] 67.223 kg (148 lb 3.2 oz) (12/06 0538) Last BM Date: 02/14/14 NPO +BM Afebrile,  H/H is stable Nothing noted from the NG, 160 ml recorded, he is passing flatus and had a stool. Intake/Output from previous day: 12/05 0701 - 12/06 0700 In: 1111.7 [I.V.:631.7; NG/GT:480] Out: 161 [Emesis/NG output:160; Stool:1] Intake/Output this shift:    General appearance: alert, cooperative and no distress Resp: course breath sounds still coughing up thick phlegm and doesn't really clear it. GI: soft, still sore and tender, drain in place and full of yellowish brown colored purulent fluid.  Lab Results:   Recent Labs  02/13/14 2350 02/14/14 0813 02/14/14 1850  WBC 17.6* 17.2*  --   HGB 8.7* 8.7* 8.9*  HCT 26.3* 27.3* 27.3*  PLT 276 257  --     BMET  Recent Labs  02/13/14 0103 02/14/14 0813  NA 140 144  K 4.0 4.1  CL 96 100  CO2 28 29  GLUCOSE 94 116*  BUN 42* 56*  CREATININE 1.69* 1.80*  CALCIUM 9.2 9.3   PT/INR  Recent Labs  02/13/14 0103 02/14/14 0813  LABPROT 20.6* 16.7*  INR 1.75* 1.34     Recent Labs Lab 02/10/14 1106 02/11/14 0554 02/14/14 0813  AST 18 15 19   ALT 6 6 7   ALKPHOS 127* 98 77  BILITOT 1.3* 1.0 1.2  PROT 8.0 7.2 6.8  ALBUMIN 3.0* 2.6* 2.4*     Lipase     Component Value Date/Time   LIPASE 9* 02/10/2014 1106     Studies/Results: Ct Abdomen Pelvis Wo Contrast  02/14/2014   CLINICAL DATA:  Perforated appendicitis with abscess. Re-evaluate for possible drainage   EXAM: CT ABDOMEN AND PELVIS WITHOUT CONTRAST  TECHNIQUE: Multidetector CT imaging of the abdomen and pelvis was performed following the standard protocol without IV contrast.  COMPARISON:  02/10/2014  FINDINGS: Lower chest: Bibasilar dependent bronchovascular nodular opacities could represent pneumonia or aspiration. Trace pleural effusions present. Heart is enlarged. Atherosclerosis of the lower thoracic aorta. No pericardial effusion. NG tube enters the stomach.  Abdomen: Small amount of perihepatic ascites. Liver, collapsed gallbladder, biliary system, or atrophic pancreas, spleen, adrenal glands, and kidneys are within normal limits for age and noncontrast imaging.  Oral contrast present throughout small and large bowel. Negative for obstruction or ileus.  No free air evident.  In the right lower quadrant, there is a slightly larger fluid collection with tiny gas bubbles inferior to the cecum measuring 5.1 x 4.1 cm. This would be amenable to percutaneous drainage. Slight thickening of the adjacent small bowel, suspect reactive enteritis. Findings compatible with perforated appendicitis and right lower quadrant abscess.  Pelvis: No significant pelvic fluid collection, free fluid, hemorrhage, abscess, adenopathy, or inguinal abnormality. Small fat containing right inguinal hernia noted. Prostate gland is mildly enlarged.  Diffuse degenerative changes noted of the spine. No acute osseous finding.  IMPRESSION: Slight enlargement of the right lower quadrant pericecal air-fluid collection measuring 5.1 x 4.1 cm  compatible with an abscess related to perforated appendicitis. This would be amenable to CT percutaneous drainage.  Adjacent small bowel enteritis in the right lower quadrant.  Negative for obstruction or free air.   Electronically Signed   By: Daryll Brod M.D.   On: 02/14/2014 13:45   Dg Chest 2 View  02/13/2014   CLINICAL DATA:  Chest pain, cough, shortness of Breath  EXAM: CHEST  2 VIEW  COMPARISON:   02/11/2014  FINDINGS: Cardiomediastinal silhouette is stable. NG tube in place with tip in mid stomach. Contrast material from recent CT scan noted within left colon. Mild left basilar atelectasis. Mild degenerative changes thoracic spine. No acute infiltrate or pulmonary edema.  IMPRESSION: No acute infiltrate or pulmonary edema. Mild left basilar atelectasis. NG tube in place.   Electronically Signed   By: Lahoma Crocker M.D.   On: 02/13/2014 11:08    Medications: . sodium chloride   Intravenous Once  . allopurinol  300 mg Oral Daily  . antiseptic oral rinse  7 mL Mouth Rinse BID  . brimonidine  1 drop Both Eyes Q12H   And  . timolol  1 drop Both Eyes Q12H  . budesonide-formoterol  2 puff Inhalation BID  . diltiazem  180 mg Oral Daily  . fentaNYL      . latanoprost  1 drop Both Eyes QHS  . lidocaine      . midazolam      . montelukast  10 mg Oral QHS  . pantoprazole (PROTONIX) IV  40 mg Intravenous Q12H  . piperacillin-tazobactam (ZOSYN)  IV  3.375 g Intravenous Q8H  . sodium chloride  3 mL Intravenous Q12H  . tiotropium  18 mcg Inhalation Daily  . vancomycin  1,000 mg Intravenous Q24H    Assessment/Plan Perforated appendicitis with abscess.. S/p IR drain placement today 02/15/14. Hx of CHF (diastolic grade 2 dysfunction) Hx of COPD/home O2 Bronchitis  Afib on chronic coumadin Acute on chronic renal disease Anemia  Plan:  I am going to try a clamping trial for the rest of the day.  If he does OK we may be able to start liquids tomorrow.  If not he may need PICC/TNA tomorrow.  He needs to be up walking for his pulmonary status as much as his GI, so I will put in OOB and ambulate orders.  Ask PT to see. Will recheck labs in AM.  He is on day 5 of zosyn and and day 4 of Vancomycin. i increased his fluids to 75 per hour.  He has been NPO since admit 02/10/14.     He has an EF of 50-55% on 06/17/13 echo study. He is off coumadin and last INR 1.34 he can be on chemical prophylaxis from our  standpoint.    LOS: 5 days    Gracieann Stannard 02/15/2014

## 2014-02-15 NOTE — Progress Notes (Signed)
[place] for Vancomycin and Zosyn Indication: sepsis, perforated appendix  Allergies  Allergen Reactions  . Aspirin Other (See Comments)    On coumadin   Patient Measurements: Height: 5\' 6"  (167.6 cm) Weight: 148 lb 3.2 oz (67.223 kg) IBW/kg (Calculated) : 63.8  Vital Signs: Temp: 97.9 F (36.6 C) (12/06 1305) Temp Source: Oral (12/06 1305) BP: 122/68 mmHg (12/06 1305) Pulse Rate: 105 (12/06 1305) Intake/Output from previous day: 12/05 0701 - 12/06 0700 In: 1784.7 [I.V.:1204.7; NG/GT:580] Out: 161 [Emesis/NG output:160; Stool:1] Intake/Output from this shift:    Labs:  Recent Labs  02/13/14 0103  02/13/14 2350 02/14/14 0813 02/14/14 1850 02/15/14 1907  WBC 16.0*  --  17.6* 17.2*  --   --   HGB 8.4*  < > 8.7* 8.7* 8.9* 8.1*  PLT 257  --  276 257  --   --   CREATININE 1.69*  --   --  1.80*  --   --   < > = values in this interval not displayed. Estimated Creatinine Clearance: 32.5 mL/min (by C-G formula based on Cr of 1.8).  Recent Labs  02/15/14 1907  VANCOTROUGH 19.3     Microbiology: Recent Results (from the past 720 hour(s))  Blood culture (routine x 2)     Status: None   Collection Time: 02/10/14  1:50 PM  Result Value Ref Range Status   Specimen Description BLOOD RIGHT ANTECUBITAL  Final   Special Requests BOTTLES DRAWN AEROBIC AND ANAEROBIC [facility]  Final   Culture  Setup Time   Final    02/13/2014 23:00 Performed at [facility]    Culture   Final    BACTEROIDES THETAIOTAOMICRON Note: CRITICAL RESULT CALLED TO, READ BACK BY AND VERIFIED WITH: N CAGUIOA AT 2319 02/13/14 S VANHOORNE BETA LACTAMASE NEGATIVE Performed at [facility]    Report Status 02/15/2014 FINAL  Final  Blood culture (routine x 2)     Status: None   Collection Time: 02/10/14  2:10 PM  Result Value Ref Range Status   Specimen Description BLOOD RIGHT ARM  Final   Special Requests   Final    BOTTLES DRAWN AEROBIC AND ANAEROBIC  AEB=8CC ANA=6CC   Culture NO GROWTH 5 DAYS  Final   Report Status 02/15/2014 FINAL  Final     Anti-infectives    Start     Dose/Rate Route Frequency Ordered Stop   02/12/14 2000  piperacillin-tazobactam (ZOSYN) IVPB 3.375 g     3.375 g12.5 mL/hr over 240 Minutes Intravenous Every 8 hours 02/12/14 1753     02/12/14 2000  vancomycin (VANCOCIN) IVPB 1000 mg/200 mL premix     1,000 mg200 mL/hr over 60 Minutes Intravenous Every 24 hours 02/12/14 1753     02/11/14 1700  vancomycin (VANCOCIN) IVPB 1000 mg/200 mL premix  Status:  Discontinued     1,000 mg200 mL/hr over 60 Minutes Intravenous Every 24 hours 02/10/14 1607 02/12/14 1753   02/10/14 2200  piperacillin-tazobactam (ZOSYN) IVPB 3.375 g  Status:  Discontinued     3.375 g12.5 mL/hr over 240 Minutes Intravenous Every 8 hours 02/10/14 1602 02/12/14 1753   02/10/14 1700  vancomycin (VANCOCIN) 1,250 mg in sodium chloride 0.9 % 250 mL IVPB     1,250 mg166.7 mL/hr over 90 Minutes Intravenous  Once 02/10/14 1605 02/10/14 1934   02/10/14 1345  piperacillin-tazobactam (ZOSYN) IVPB 3.375 g     3.375 g100 mL/hr over 30 Minutes Intravenous  Once 02/10/14 1344 02/10/14 1452  Assessment: 73yo male presented to ED with c/o abdominal pain.  Initial evaluation reveals a ruptured appendix with associated abscess and acute renal failure. Current SCr 1.8, up from 1.69 on 12/4, baseline appears to be 1-1.2. A vancomycin trough drawn this evening was therapeutic at 19.25mcg/mL.   Blood culture growing 1/2 beta lactamase negative bacteroides thetaiotaomicron. Current regimen adequate per ID pharmacist.  Goal of Therapy:  Vancomycin trough level 15-20 mcg/ml Eradicate infection  Plan: - Continue Vancomycin 1000mg  IV q24hrs - Zosyn 3.375gm IV q8h, each dose over 4 hrs - follow c/s, clinical function, likely will need to repeat trough if renal function does not improve to ensure patient is not accumulating vancomycin   Audrea Bolte D. Elazar Argabright, PharmD,  BCPS Clinical Pharmacist Pager: [phone] 02/15/2014 8:25 PM

## 2014-02-15 NOTE — Progress Notes (Signed)
[facility] PROGRESS NOTE  Theodore [name] KXF:[id number] DOB: June 22, 1940 DOA: 02/10/2014 PCP: Tula Nakayama, MD  Assessment/Plan:  1. Perforated appendix with abscess/sepsis; CT: Findings are concerning for ruptured appendicitis with an associated approximately 4.5 x 3.2 cm abscess  - Awaiting further recommendations from surgery - Cardiology consulted for surgical clearance, currently no plans for further cardiac testing - Pt is s/p drain placement by IR - Continue broad spectrum antibiotics - agree with mobilizing.  2. CHF, diastolic HF; echo (6967): LVEF 89%, diastolic dysfunction, pulmonary HTN PASP 74, TR -thought mild hypervolemic at [facility], given lasix; currently clinically stable; gentle IVF while NPO; hold lasix; use prn; monitor I/O, daily weight, fluid balance  -gentle MIVF while NPO; NG  3. COPD on home oxygen; no wheezing on exam; cont supplemental oxygen, bronchodilators as needed   4. CT: Bilateral lower lobe bronchiectatic changes and consolidation are suspicious for chronic aspiration pneumonitis -Will place speech therapy evaluation  5. A. fib on Coumadin; HR is stable; coumadin on hold for procedure. INR currently trending down  6. AKI on CKD II in the setting of sepsis; likely ATN;  -gentle IVF, close monitor I/O, daily weight; urine output   7. Acute anemia - stable with last value at 8.7  Code Status: stable Family Communication: None at bedside Disposition Plan: Pending recommendations from specialist involved   Consultants:  General surgery  IR  Cardiology  Procedures:  pending  Antibiotics:  Vancomycin and zosyn  HPI/Subjective: Pt has no new complaints.  Objective: Filed Vitals:   02/15/14 1305  BP: 122/68  Pulse: 105  Temp: 97.9 F (36.6 C)  Resp: 20    Intake/Output Summary (Last 24 hours) at 02/15/14 1716 Last data filed at 02/15/14 1430  Gross per 24 hour  Intake 631.67 ml  Output     41 ml  Net 590.67 ml    Filed Weights   02/13/14 0500 02/14/14 0548 02/15/14 0538  Weight: 65.772 kg (145 lb) 76.386 kg (168 lb 6.4 oz) 67.223 kg (148 lb 3.2 oz)    Exam:   General:  Pt in nad, alert and awake  Cardiovascular: s1 and s2 present, no mrg  Respiratory: cta bl, no wheezes  Abdomen: soft, pain on palpation , no guarding  Musculoskeletal: no clubbing or cyanosis   Data Reviewed: Basic Metabolic Panel:  Recent Labs Lab 02/10/14 1106 02/10/14 1116 02/11/14 0554 02/12/14 1120 02/13/14 0103 02/14/14 0813  NA 138 136* 138 138 140 144  K 5.2 4.8 4.4 4.1 4.0 4.1  CL 93* 94* 94* 94* 96 100  CO2 30  --  34* 30 28 29   GLUCOSE 139* 132* 130* 86 94 116*  BUN 25* 32* 29* 37* 42* 56*  CREATININE 1.55* 1.60* 1.70* 1.75* 1.69* 1.80*  CALCIUM 8.9  --  9.0 9.0 9.2 9.3   Liver Function Tests:  Recent Labs Lab 02/10/14 1106 02/11/14 0554 02/14/14 0813  AST 18 15 19   ALT 6 6 7   ALKPHOS 127* 98 77  BILITOT 1.3* 1.0 1.2  PROT 8.0 7.2 6.8  ALBUMIN 3.0* 2.6* 2.4*    Recent Labs Lab 02/10/14 1106  LIPASE 9*   No results for input(s): AMMONIA in the last 168 hours. CBC:  Recent Labs Lab 02/10/14 1106  02/12/14 1120  02/13/14 0103 02/13/14 0615 02/13/14 1933 02/13/14 2350 02/14/14 0813 02/14/14 1850  WBC 26.5*  --  17.9*  --  16.0*  --   --  17.6* 17.2*  --   NEUTROABS 24.4*  --   --   --   --   --   --   --   --   --  HGB 11.0*  < > 8.9*  < > 8.4* 9.0* 9.2* 8.7* 8.7* 8.9*  HCT 34.3*  < > 28.3*  < > 26.0* 28.4* 28.3* 26.3* 27.3* 27.3*  MCV 91.2  --  87.1  --  86.1  --   --  86.5 84.5  --   PLT 341  --  266  --  257  --   --  276 257  --   < > = values in this interval not displayed. Cardiac Enzymes:  Recent Labs Lab 02/12/14 1904 02/13/14 0103 02/13/14 0615  TROPONINI <0.30 <0.30 <0.30   BNP (last 3 results)  Recent Labs  06/16/13 1554 06/17/13 0537 07/09/13 0555  PROBNP 3560.0* 3222.0* 3842.0*   CBG: No results for input(s): GLUCAP in the last 168  hours.  Recent Results (from the past 240 hour(s))  Blood culture (routine x 2)     Status: None   Collection Time: 02/10/14  1:50 PM  Result Value Ref Range Status   Specimen Description BLOOD RIGHT ANTECUBITAL  Final   Special Requests BOTTLES DRAWN AEROBIC AND ANAEROBIC [facility]  Final   Culture  Setup Time   Final    02/13/2014 23:00 Performed at [facility]    Culture   Final    BACTEROIDES THETAIOTAOMICRON Note: CRITICAL RESULT CALLED TO, READ BACK BY AND VERIFIED WITH: N CAGUIOA AT 2319 02/13/14 S VANHOORNE BETA LACTAMASE NEGATIVE Performed at [facility]    Report Status 02/15/2014 FINAL  Final  Blood culture (routine x 2)     Status: None   Collection Time: 02/10/14  2:10 PM  Result Value Ref Range Status   Specimen Description BLOOD RIGHT ARM  Final   Special Requests   Final    BOTTLES DRAWN AEROBIC AND ANAEROBIC AEB=8CC ANA=6CC   Culture NO GROWTH 5 DAYS  Final   Report Status 02/15/2014 FINAL  Final     Studies: Ct Abdomen Pelvis Wo Contrast  02/14/2014   CLINICAL DATA:  Perforated appendicitis with abscess. Re-evaluate for possible drainage  EXAM: CT ABDOMEN AND PELVIS WITHOUT CONTRAST  TECHNIQUE: Multidetector CT imaging of the abdomen and pelvis was performed following the standard protocol without IV contrast.  COMPARISON:  02/10/2014  FINDINGS: Lower chest: Bibasilar dependent bronchovascular nodular opacities could represent pneumonia or aspiration. Trace pleural effusions present. Heart is enlarged. Atherosclerosis of the lower thoracic aorta. No pericardial effusion. NG tube enters the stomach.  Abdomen: Small amount of perihepatic ascites. Liver, collapsed gallbladder, biliary system, or atrophic pancreas, spleen, adrenal glands, and kidneys are within normal limits for age and noncontrast imaging.  Oral contrast present throughout small and large bowel. Negative for obstruction or ileus.  No free air evident.  In the right lower quadrant, there is a  slightly larger fluid collection with tiny gas bubbles inferior to the cecum measuring 5.1 x 4.1 cm. This would be amenable to percutaneous drainage. Slight thickening of the adjacent small bowel, suspect reactive enteritis. Findings compatible with perforated appendicitis and right lower quadrant abscess.  Pelvis: No significant pelvic fluid collection, free fluid, hemorrhage, abscess, adenopathy, or inguinal abnormality. Small fat containing right inguinal hernia noted. Prostate gland is mildly enlarged.  Diffuse degenerative changes noted of the spine. No acute osseous finding.  IMPRESSION: Slight enlargement of the right lower quadrant pericecal air-fluid collection measuring 5.1 x 4.1 cm compatible with an abscess related to perforated appendicitis. This would be amenable to CT percutaneous drainage.  Adjacent small bowel enteritis in the right  lower quadrant.  Negative for obstruction or free air.   Electronically Signed   By: Daryll Brod M.D.   On: 02/14/2014 13:45   Ct Image Guided Drainage By Percutaneous Catheter  02/15/2014   CLINICAL DATA:  Right lower quadrant abscess  EXAM: CT IMAGE GUIDED DRAINAGE BY PERCUTANEOUS CATHETER  FLUOROSCOPY TIME:  Min  MEDICATIONS AND MEDICAL HISTORY: Versed 0.5 mg, Fentanyl 25 mcg.  Additional Medications: None.  ANESTHESIA/SEDATION: Moderate sedation time: 15 minutes  CONTRAST:  Min  PROCEDURE: The procedure, risks, benefits, and alternatives were explained to the patient. Questions regarding the procedure were encouraged and answered. The patient understands and consents to the procedure.  The right lower quadrant was prepped with Betadine in a sterile fashion, and a sterile drape was applied covering the operative field. A sterile gown and sterile gloves were used for the procedure.  Under CT guidance, an 18 gauge to inserted into the right lower quadrant abscess and removed over an Amplatz. A 12 French dilator followed by a 12 [place] drain were inserted. It was  looped and string fixed in the abscess. 15 cc pus was aspirated.  FINDINGS: Images document 106 French drain placement into a right lower quadrant abscess.  COMPLICATIONS: None  IMPRESSION: Successful right lower quadrant abscess drain.   Electronically Signed   By: Maryclare Bean M.D.   On: 02/15/2014 11:45    Scheduled Meds: . sodium chloride   Intravenous Once  . allopurinol  300 mg Oral Daily  . antiseptic oral rinse  7 mL Mouth Rinse BID  . brimonidine  1 drop Both Eyes Q12H   And  . timolol  1 drop Both Eyes Q12H  . budesonide-formoterol  2 puff Inhalation BID  . diltiazem  180 mg Oral Daily  . fentaNYL      . heparin subcutaneous  5,000 Units Subcutaneous 3 times per day  . latanoprost  1 drop Both Eyes QHS  . lidocaine      . midazolam      . montelukast  10 mg Oral QHS  . pantoprazole (PROTONIX) IV  40 mg Intravenous Q12H  . piperacillin-tazobactam (ZOSYN)  IV  3.375 g Intravenous Q8H  . sodium chloride  3 mL Intravenous Q12H  . tiotropium  18 mcg Inhalation Daily  . vancomycin  1,000 mg Intravenous Q24H   Continuous Infusions: . dextrose 5 % and 0.45 % NaCl with KCl 20 mEq/L 75 mL/hr at 02/15/14 1514     Time spent: > 35 minutes    [name]  [facility] Pager [phone]  If 7PM-7AM, please contact night-coverage at [email or web address], password [facility] 02/15/2014, 5:16 PM  LOS: 5 days

## 2014-02-15 NOTE — Progress Notes (Signed)
[place] for Vancomycin and Zosyn Indication: sepsis, perforated appendix  Allergies  Allergen Reactions  . Aspirin Other (See Comments)    On coumadin   Labs:  Recent Labs  02/13/14 0103  02/13/14 2350 02/14/14 0813 02/14/14 1850  WBC 16.0*  --  17.6* 17.2*  --   HGB 8.4*  < > 8.7* 8.7* 8.9*  PLT 257  --  276 257  --   CREATININE 1.69*  --   --  1.80*  --   < > = values in this interval not displayed. Estimated Creatinine Clearance: 32.5 mL/min (by C-G formula based on Cr of 1.8). No results for input(s): VANCOTROUGH, VANCOPEAK, VANCORANDOM, GENTTROUGH, GENTPEAK, GENTRANDOM, TOBRATROUGH, TOBRAPEAK, TOBRARND, AMIKACINPEAK, AMIKACINTROU, AMIKACIN in the last 72 hours.   Microbiology: Recent Results (from the past 720 hour(s))  Blood culture (routine x 2)     Status: None (Preliminary result)   Collection Time: 02/10/14  1:50 PM  Result Value Ref Range Status   Specimen Description BLOOD RIGHT ANTECUBITAL  Final   Special Requests BOTTLES DRAWN AEROBIC AND ANAEROBIC [facility]  Final   Culture  Setup Time   Final    02/13/2014 23:00 Performed at [facility] Note: CRITICAL RESULT CALLED TO, READ BACK BY AND VERIFIED WITH: N CAGUIOA AT 2319 02/13/14 S VANHOORNE Performed at [facility]    Report Status PENDING  Incomplete  Blood culture (routine x 2)     Status: None   Collection Time: 02/10/14  2:10 PM  Result Value Ref Range Status   Specimen Description BLOOD RIGHT ARM  Final   Special Requests   Final    BOTTLES DRAWN AEROBIC AND ANAEROBIC AEB=8CC ANA=6CC   Culture NO GROWTH 5 DAYS  Final   Report Status 02/15/2014 FINAL  Final     Assessment: 73 year old male on day #6 vanc/zosyn for ruptured appendix with abscess.  Abscess drain placed by IR today. Afebrile. WBC 17.2.  Creat 1.8.  1 of 2 BC with GNR.  Zosyn 12/1>> Vanc 12/1>>  12/1 Blood Cx >> 1 of 2 GNR 12/1 Blood  Cx>>1 NGF  Goal of Therapy:  Vancomycin trough level 15-20 mcg/ml Eradicate infection.  Plan: Continue Vancomycin 1 Gram iv Q 24 hours Continue Zosyn 3.375 grams iv Q 8 hours - 4 hr infusion Vancomycin trough tonight, hold dose until level resulted Add sq heparin to cover while INR < 2  Eudelia Bunch, Pharm.D. [phone] 02/15/2014 1:27 PM

## 2014-02-15 NOTE — Progress Notes (Signed)
Reason for Consult:Abdominal abscess Consulting Radiologist: Augusta Hilbert Referring Physician: Donell Beers   HPI: Theodore [name] is an 73 y.o. male admitted with perforated appendicitis and abscess. He has been on IV abx for several days. Repeat CT shows enlargement of the abscess. IR is requested to perc drain. Pt on chronic Coumadin for afib, this has been held and INR has normalized. Chart, PMHx, meds, imaging reviewed. Pt has been NPO with NGT for associated ileus.  Past Medical History:  Past Medical History  Diagnosis Date  . Glaucoma     uses eye drops daily  . Allergic rhinitis     takes Singulair at bedtime  . Atrial fibrillation     takes Coumadin daily  . Diastolic heart failure     LVEF 50-55%, rate 2 diastolic dysfunction 03/2012  . Pulmonary hypertension     70 mmHg 03/2012  . Asthma     Albuterol neb and inhaler as needed  . Anxiety     takes Xanax daily as needed  . COPD (chronic obstructive pulmonary disease)     Symbicort daily  . Essential hypertension, benign     takes Metoprolol and Diltiazem daily  . Anemia     takes ferrous sulfate daily  . History of bronchitis   . Shortness of breath     with exertion  . Numbness     fingertips on both hands  . Lung nodules   . History of blood transfusion     Surgical History:  Past Surgical History  Procedure Laterality Date  . Bilateral cataract surgery    . Herniorrhapy    . Tendon repair      Right hand surgical procedure for a tendon repair  . Colonoscopy N/A 11/29/2012    Procedure: COLONOSCOPY;  Surgeon: West Bali, MD;  Location: [facility];  Service: Endoscopy;  Laterality: N/A;  1:00  . Esophagogastroduodenoscopy N/A 11/29/2012    Procedure: ESOPHAGOGASTRODUODENOSCOPY (EGD);  Surgeon: West Bali, MD;  Location: [facility];  Service: Endoscopy;  Laterality: N/A;  . Eye surgery    . Hernia repair    . Radiology with anesthesia N/A 07/10/2013    Procedure: EMBOLIZATION-RADIOLOGY WITH ANESTHESIA;   Surgeon: Oneal Grout, MD;  Location: [facility] OR;  Service: Radiology;  Laterality: N/A;  . Nose surgery      d/t nosebleeds  . Video bronchoscopy with endobronchial ultrasound N/A 11/21/2013    Procedure: VIDEO BRONCHOSCOPY WITH ENDOBRONCHIAL ULTRASOUND;  Surgeon: Loreli Slot, MD;  Location: [facility] OR;  Service: Thoracic;  Laterality: N/A;  . Mediastinoscopy N/A 11/21/2013    Procedure: MEDIASTINOSCOPY;  Surgeon: Loreli Slot, MD;  Location: [facility] OR;  Service: Thoracic;  Laterality: N/A;    Family History:  Family History  Problem Relation Age of Onset  . Cancer Mother   . Cancer Father   . Coronary artery disease Brother   . Colon cancer Brother     Social History:  reports that he has been smoking Cigarettes.  He has a 30 pack-year smoking history. He has never used smokeless tobacco. He reports that he does not drink alcohol or use illicit drugs.  Allergies:  Allergies  Allergen Reactions  . Aspirin Other (See Comments)    On coumadin    Medications: Current facility-administered medications: 0.9 %  sodium chloride infusion, , Intravenous, Once, Esperanza Sheets, MD;  acetaminophen (TYLENOL) tablet 650 mg, 650 mg, Oral, Q6H PRN, 650 mg at 02/10/14 1635 **OR** acetaminophen (TYLENOL) suppository 650  mg, 650 mg, Rectal, Q6H PRN, Lezlie Octave Black, NP;  albuterol (PROVENTIL) (2.5 MG/3ML) 0.083% nebulizer solution 2.5 mg, 2.5 mg, Nebulization, Q8H PRN, Radene Gunning, NP, 2.5 mg at 02/15/14 0548 allopurinol (ZYLOPRIM) tablet 300 mg, 300 mg, Oral, Daily, Lezlie Octave Black, NP, 300 mg at 02/14/14 2353;  ALPRAZolam Duanne Moron) tablet 0.25 mg, 0.25 mg, Oral, Daily PRN, Radene Gunning, NP;  alum & mag hydroxide-simeth (MAALOX/MYLANTA) 200-200-20 MG/5ML suspension 30 mL, 30 mL, Oral, Q6H PRN, Radene Gunning, NP;  antiseptic oral rinse (CPC / CETYLPYRIDINIUM CHLORIDE 0.05%) solution 7 mL, 7 mL, Mouth Rinse, BID, Kinnie Feil, MD, 7 mL at 02/14/14 2242 brimonidine (ALPHAGAN) 0.2 % ophthalmic  solution 1 drop, 1 drop, Both Eyes, Q12H, 1 drop at 02/14/14 2241 **AND** timolol (TIMOPTIC) 0.5 % ophthalmic solution 1 drop, 1 drop, Both Eyes, Q12H, Donne Hazel, MD, 1 drop at 02/14/14 2242;  budesonide-formoterol (SYMBICORT) 160-4.5 MCG/ACT inhaler 2 puff, 2 puff, Inhalation, BID, Radene Gunning, NP, 2 puff at 02/14/14 2053 dextrose 5 % and 0.45 % NaCl with KCl 20 mEq/L infusion, , Intravenous, Continuous, [name], MD, Last Rate: 50 mL/hr at 02/14/14 1852;  diltiazem (CARDIZEM CD) 24 hr capsule 180 mg, 180 mg, Oral, Daily, Lezlie Octave Black, NP, 180 mg at 02/14/14 6144;  HYDROcodone-acetaminophen (NORCO/VICODIN) 5-325 MG per tablet 1-2 tablet, 1-2 tablet, Oral, Q4H PRN, Radene Gunning, NP, 2 tablet at 02/12/14 0544 latanoprost (XALATAN) 0.005 % ophthalmic solution 1 drop, 1 drop, Both Eyes, QHS, Donne Hazel, MD, 1 drop at 02/14/14 2242;  montelukast (SINGULAIR) tablet 10 mg, 10 mg, Oral, QHS, Lezlie Octave Black, NP, 10 mg at 02/14/14 2243;  morphine 2 MG/ML injection 1 mg, 1 mg, Intravenous, Q2H PRN, Lezlie Octave Black, NP, 1 mg at 02/13/14 0100 ondansetron (ZOFRAN) tablet 4 mg, 4 mg, Oral, Q6H PRN **OR** ondansetron (ZOFRAN) injection 4 mg, 4 mg, Intravenous, Q6H PRN, Lezlie Octave Black, NP;  pantoprazole (PROTONIX) injection 40 mg, 40 mg, Intravenous, Q12H, Lutricia Feil, PA, 40 mg at 02/14/14 2243;  piperacillin-tazobactam (ZOSYN) IVPB 3.375 g, 3.375 g, Intravenous, Q8H, Romona Curls, RPH, 3.375 g at 02/15/14 0438 sodium chloride 0.9 % injection 3 mL, 3 mL, Intravenous, Q12H, Lezlie Octave Black, NP, 3 mL at 02/14/14 1002;  tiotropium [facility]) inhalation capsule 18 mcg, 18 mcg, Inhalation, Daily, Radene Gunning, NP, 18 mcg at 02/14/14 3154;  traZODone (DESYREL) tablet 25-50 mg, 25-50 mg, Oral, QHS PRN, Radene Gunning, NP, 50 mg at 02/11/14 2210 vancomycin (VANCOCIN) IVPB 1000 mg/200 mL premix, 1,000 mg, Intravenous, Q24H, Romona Curls, RPH, 1,000 mg at 02/14/14 2037   ROS: See HPI for pertinent findings,  otherwise complete 10 system review negative.  Physical Exam: Blood pressure 120/68, pulse 84, temperature 97.5 F (36.4 C), temperature source Oral, resp. rate 19, height $RemoveBe'5\' 6"'cXNnoNdtI$  (1.676 m), weight 148 lb 3.2 oz (67.223 kg), SpO2 90 %. ENT: unremarkable airway Lungs: CTA without w/r/r Heart: Regular Abdomen: tender RLQ   Labs: Results for orders placed or performed during the hospital encounter of 02/10/14 (from the past 48 hour(s))  Hemoglobin and hematocrit, blood     Status: Abnormal   Collection Time: 02/13/14  7:33 PM  Result Value Ref Range   Hemoglobin 9.2 (L) 13.0 - 17.0 g/dL   HCT 28.3 (L) 39.0 - 52.0 %  CBC     Status: Abnormal   Collection Time: 02/13/14 11:50 PM  Result Value Ref Range   WBC 17.6 (H)  4.0 - 10.5 K/uL   RBC 3.04 (L) 4.22 - 5.81 MIL/uL   Hemoglobin 8.7 (L) 13.0 - 17.0 g/dL   HCT 15.4 (L) 29.8 - 59.0 %   MCV 86.5 78.0 - 100.0 fL   MCH 28.6 26.0 - 34.0 pg   MCHC 33.1 30.0 - 36.0 g/dL   RDW 34.1 (H) 88.8 - 54.1 %   Platelets 276 150 - 400 K/uL  Protime-INR     Status: Abnormal   Collection Time: 02/14/14  8:13 AM  Result Value Ref Range   Prothrombin Time 16.7 (H) 11.6 - 15.2 seconds   INR 1.34 0.00 - 1.49  CBC     Status: Abnormal   Collection Time: 02/14/14  8:13 AM  Result Value Ref Range   WBC 17.2 (H) 4.0 - 10.5 K/uL   RBC 3.23 (L) 4.22 - 5.81 MIL/uL   Hemoglobin 8.7 (L) 13.0 - 17.0 g/dL   HCT 82.5 (L) 82.7 - 14.7 %   MCV 84.5 78.0 - 100.0 fL   MCH 26.9 26.0 - 34.0 pg   MCHC 31.9 30.0 - 36.0 g/dL   RDW 15.7 (H) 87.3 - 94.7 %   Platelets 257 150 - 400 K/uL  Comprehensive metabolic panel     Status: Abnormal   Collection Time: 02/14/14  8:13 AM  Result Value Ref Range   Sodium 144 137 - 147 mEq/L   Potassium 4.1 3.7 - 5.3 mEq/L   Chloride 100 96 - 112 mEq/L   CO2 29 19 - 32 mEq/L   Glucose, Bld 116 (H) 70 - 99 mg/dL   BUN 56 (H) 6 - 23 mg/dL   Creatinine, Ser 6.93 (H) 0.50 - 1.35 mg/dL   Calcium 9.3 8.4 - 46.2 mg/dL   Total Protein 6.8  6.0 - 8.3 g/dL   Albumin 2.4 (L) 3.5 - 5.2 g/dL   AST 19 0 - 37 U/L   ALT 7 0 - 53 U/L   Alkaline Phosphatase 77 39 - 117 U/L   Total Bilirubin 1.2 0.3 - 1.2 mg/dL   GFR calc non Af Amer 35 (L) >90 mL/min   GFR calc Af Amer 41 (L) >90 mL/min    Comment: (NOTE) The eGFR has been calculated using the CKD EPI equation. This calculation has not been validated in all clinical situations. eGFR's persistently <90 mL/min signify possible Chronic Kidney Disease.    Anion gap 15 5 - 15  Hemoglobin and hematocrit, blood     Status: Abnormal   Collection Time: 02/14/14  6:50 PM  Result Value Ref Range   Hemoglobin 8.9 (L) 13.0 - 17.0 g/dL   HCT 53.1 (L) 03.3 - 75.2 %    PT/INR  Recent Labs  02/13/14 0103 02/14/14 0813  LABPROT 20.6* 16.7*  INR 1.75* 1.34       Ct Abdomen Pelvis Wo Contrast  02/14/2014   CLINICAL DATA:  Perforated appendicitis with abscess. Re-evaluate for possible drainage  EXAM: CT ABDOMEN AND PELVIS WITHOUT CONTRAST  TECHNIQUE: Multidetector CT imaging of the abdomen and pelvis was performed following the standard protocol without IV contrast.  COMPARISON:  02/10/2014  FINDINGS: Lower chest: Bibasilar dependent bronchovascular nodular opacities could represent pneumonia or aspiration. Trace pleural effusions present. Heart is enlarged. Atherosclerosis of the lower thoracic aorta. No pericardial effusion. NG tube enters the stomach.  Abdomen: Small amount of perihepatic ascites. Liver, collapsed gallbladder, biliary system, or atrophic pancreas, spleen, adrenal glands, and kidneys are within normal limits for age and noncontrast imaging.  Oral contrast present throughout small and large bowel. Negative for obstruction or ileus.  No free air evident.  In the right lower quadrant, there is a slightly larger fluid collection with tiny gas bubbles inferior to the cecum measuring 5.1 x 4.1 cm. This would be amenable to percutaneous drainage. Slight thickening of the adjacent  small bowel, suspect reactive enteritis. Findings compatible with perforated appendicitis and right lower quadrant abscess.  Pelvis: No significant pelvic fluid collection, free fluid, hemorrhage, abscess, adenopathy, or inguinal abnormality. Small fat containing right inguinal hernia noted. Prostate gland is mildly enlarged.  Diffuse degenerative changes noted of the spine. No acute osseous finding.  IMPRESSION: Slight enlargement of the right lower quadrant pericecal air-fluid collection measuring 5.1 x 4.1 cm compatible with an abscess related to perforated appendicitis. This would be amenable to CT percutaneous drainage.  Adjacent small bowel enteritis in the right lower quadrant.  Negative for obstruction or free air.   Electronically Signed   By: Daryll Brod M.D.   On: 02/14/2014 13:45   Dg Chest 2 View  02/13/2014   CLINICAL DATA:  Chest pain, cough, shortness of Breath  EXAM: CHEST  2 VIEW  COMPARISON:  02/11/2014  FINDINGS: Cardiomediastinal silhouette is stable. NG tube in place with tip in mid stomach. Contrast material from recent CT scan noted within left colon. Mild left basilar atelectasis. Mild degenerative changes thoracic spine. No acute infiltrate or pulmonary edema.  IMPRESSION: No acute infiltrate or pulmonary edema. Mild left basilar atelectasis. NG tube in place.   Electronically Signed   By: Lahoma Crocker M.D.   On: 02/13/2014 11:08    Assessment/Plan: Perforated appendicitis with abscess Plan for attempt at CT guided drainage of this abscess Explained procedure, risks, complications, use of sedation Cardiology eval and recs noted, should do fine with a small amount of sedation for this procedure. Labs reviewed. Consent signed in chart  Ascencion Dike PA-C 02/15/2014, 8:40 AM

## 2014-02-15 NOTE — Procedures (Signed)
RLQ 12 fr abscess drain 15 cc pus No comp

## 2014-02-16 LAB — CBC
HEMATOCRIT: 23.6 % — AB (ref 39.0–52.0)
Hemoglobin: 7.5 g/dL — ABNORMAL LOW (ref 13.0–17.0)
MCH: 26.9 pg (ref 26.0–34.0)
MCHC: 31.8 g/dL (ref 30.0–36.0)
MCV: 84.6 fL (ref 78.0–100.0)
Platelets: 258 10*3/uL (ref 150–400)
RBC: 2.79 MIL/uL — ABNORMAL LOW (ref 4.22–5.81)
RDW: 16.8 % — ABNORMAL HIGH (ref 11.5–15.5)
WBC: 12.8 10*3/uL — ABNORMAL HIGH (ref 4.0–10.5)

## 2014-02-16 LAB — COMPREHENSIVE METABOLIC PANEL
ALT: 7 U/L (ref 0–53)
ANION GAP: 10 (ref 5–15)
AST: 19 U/L (ref 0–37)
Albumin: 2.2 g/dL — ABNORMAL LOW (ref 3.5–5.2)
Alkaline Phosphatase: 83 U/L (ref 39–117)
BUN: 27 mg/dL — ABNORMAL HIGH (ref 6–23)
CALCIUM: 8.7 mg/dL (ref 8.4–10.5)
CO2: 31 mEq/L (ref 19–32)
CREATININE: 1.2 mg/dL (ref 0.50–1.35)
Chloride: 101 mEq/L (ref 96–112)
GFR calc Af Amer: 67 mL/min — ABNORMAL LOW (ref >90)
GFR calc non Af Amer: 58 mL/min — ABNORMAL LOW (ref >90)
Glucose, Bld: 106 mg/dL — ABNORMAL HIGH (ref 70–99)
Potassium: 4 mEq/L (ref 3.7–5.3)
SODIUM: 142 meq/L (ref 137–147)
TOTAL PROTEIN: 6.2 g/dL (ref 6.0–8.3)
Total Bilirubin: 1.5 mg/dL — ABNORMAL HIGH (ref 0.3–1.2)

## 2014-02-16 LAB — HEMOGLOBIN AND HEMATOCRIT, BLOOD
HEMATOCRIT: 23.5 % — AB (ref 39.0–52.0)
Hemoglobin: 7.4 g/dL — ABNORMAL LOW (ref 13.0–17.0)

## 2014-02-16 LAB — PREALBUMIN: PREALBUMIN: 7.4 mg/dL — AB (ref 17.0–34.0)

## 2014-02-16 LAB — PROTIME-INR
INR: 1.28 (ref 0.00–1.49)
PROTHROMBIN TIME: 16.2 s — AB (ref 11.6–15.2)

## 2014-02-16 MED ORDER — WARFARIN - PHARMACIST DOSING INPATIENT: Freq: Every day | Status: DC

## 2014-02-16 MED ORDER — WARFARIN SODIUM 6 MG PO TABS
6.0000 mg | ORAL_TABLET | Freq: Once | ORAL | Status: AC
Start: 2014-02-16 — End: 2014-02-16
  Administered 2014-02-16: 6 mg via ORAL
  Filled 2014-02-16: qty 1

## 2014-02-16 NOTE — Evaluation (Signed)
Physical Therapy Evaluation Patient Details Name: Theodore [name] MRN: [id number] DOB: [birth year] Today's Date: 02/16/2014   History of Present Illness  Patient is a 73 y/o male presents with acute RLQ pain due to acute perforated appendicitis with abscess, s/p perc drain on 02-15-14. PMH of A-fib, diastolic heart failure, pulmonary HTN, COPD, anxiety, HTN and anemia.    Clinical Impression  Patient presents with functional limitations due to deficits listed in PT problem list (see below). Pt with generalized weakness, fatigue and balance deficits affecting safe mobility. Pt may require use of AD for support during gait. Will attempt SPC vs RW next session. Pt fatigues quickly. Pt would benefit from skilled PT to improve transfers, gait, balance and mobility so pt can maximize independence and return to PLOF. Pt's son will be home with him at discharge.    Follow Up Recommendations Home health PT;Supervision/Assistance - 24 hour    Equipment Recommendations  Other (comment) (TBD.)    Recommendations for Other Services       Precautions / Restrictions Precautions Precautions: Fall Restrictions Weight Bearing Restrictions: No      Mobility  Bed Mobility               General bed mobility comments: Received sitting in chair upon PT arrival.   Transfers Overall transfer level: Needs assistance Equipment used: None Transfers: Sit to/from Stand Sit to Stand: Min guard         General transfer comment: Min guard for safety due to unsteadiness and weakness.  Ambulation/Gait Ambulation/Gait assistance: Min assist Ambulation Distance (Feet): 50 Feet Assistive device:  (rail.) Gait Pattern/deviations: Step-through pattern;Decreased stride length;Trunk flexed;Staggering left;Staggering right;Scissoring Gait velocity: slow Gait velocity interpretation: Below normal speed for age/gender General Gait Details: Pt with unsteady gait with a few LOB requiring need to grab onto  rail or furniture for support. Min A for balance. Dyspnea present. Ambulated on 2L 02 [place]. Unable to obtain 02 reading.  Stairs            Wheelchair Mobility    Modified Rankin (Stroke Patients Only)       Balance Overall balance assessment: Needs assistance Sitting-balance support: Feet supported;No upper extremity supported Sitting balance-Leahy Scale: Fair     Standing balance support: During functional activity Standing balance-Leahy Scale: Poor Standing balance comment: Requires UE support on rail or furniture during dynamic standing due to balance deficits.                             Pertinent Vitals/Pain Pain Assessment: 0-10 Pain Score: 6  Pain Location: abdomen Pain Descriptors / Indicators: Sore Pain Intervention(s): Limited activity within patient's tolerance;Monitored during session;Patient requesting pain meds-RN notified;Repositioned    Home Living Family/patient expects to be discharged to:: Private residence Living Arrangements: Children (2 sons and 1 works and the other will be home.) Available Help at Discharge: Family Type of Home: House Home Access: Stairs to enter Entrance Stairs-Rails: Psychiatric nurse of Steps: [place]: Able to live on main level with bedroom/bathroom Home Equipment: [place] - single point      Prior Function Level of Independence: Independent         Comments: Pt wears supplemental 02 at night and PRN during the day at 2L 02 [place].     Hand Dominance        Extremity/Trunk Assessment   Upper Extremity Assessment: Overall WFL for tasks assessed  Lower Extremity Assessment: Generalized weakness         Communication   Communication: No difficulties  Cognition Arousal/Alertness: Awake/alert Behavior During Therapy: WFL for tasks assessed/performed Overall Cognitive Status: Within Functional Limits for tasks assessed                      General Comments       Exercises        Assessment/Plan    PT Assessment Patient needs continued PT services  PT Diagnosis Difficulty walking;Generalized weakness;Acute pain   PT Problem List Decreased strength;Pain;Decreased activity tolerance;Decreased balance;Decreased mobility  PT Treatment Interventions Balance training;Gait training;Stair training;Patient/family education;Functional mobility training;Therapeutic activities;Therapeutic exercise;DME instruction;Neuromuscular re-education   PT Goals (Current goals can be found in the Care Plan section) Acute Rehab PT Goals Patient Stated Goal: to get home PT Goal Formulation: With patient Time For Goal Achievement: 03/02/14 Potential to Achieve Goals: Good    Frequency Min 4X/week   Barriers to discharge        Co-evaluation               End of Session Equipment Utilized During Treatment: Gait belt Activity Tolerance: Patient limited by pain;Patient limited by fatigue Patient left: in bed;with call bell/phone within reach;with bed alarm set Nurse Communication: Mobility status         Time: 5750-5183 PT Time Calculation (min) (ACUTE ONLY): 14 min   Charges:   PT Evaluation $Initial PT Evaluation Tier I: 1 Procedure PT Treatments $Gait Training: 8-22 mins   PT G CodesCandy Sledge A 02/16/2014, 12:46 PM Candy Sledge, [place], DPT [phone]

## 2014-02-16 NOTE — Progress Notes (Signed)
Patient ID: Theodore [name], male   DOB: [birth year], 73 y.o.   MRN: [id number]    Subjective: Pt feeling better.  Less pain.  Passing flatus and BM he thinks.  No nausea  Objective: Vital signs in last 24 hours: Temp:  [97.9 F (36.6 C)-98.6 F (37 C)] 98.4 F (36.9 C) (12/07 0550) Pulse Rate:  [74-115] 94 (12/07 0550) Resp:  [14-22] 20 (12/07 0550) BP: (97-128)/(54-78) 97/65 mmHg (12/07 0550) SpO2:  [93 %-100 %] 100 % (12/07 0550) Weight:  [180 lb 14.4 oz (82.056 kg)] 180 lb 14.4 oz (82.056 kg) (12/07 0550) Last BM Date: 02/14/14  Intake/Output from previous day: 12/06 0701 - 12/07 0700 In: 917 [I.V.:887; NG/GT:30] Out: 270 [Emesis/NG output:230; Drains:40] Intake/Output this shift:    PE: Abd: much softer and less distended than when I saw him last.  +BS, Drain with serous output  Lab Results:   Recent Labs  02/13/14 2350 02/14/14 0813 02/14/14 1850 02/15/14 1907  WBC 17.6* 17.2*  --   --   HGB 8.7* 8.7* 8.9* 8.1*  HCT 26.3* 27.3* 27.3* 24.9*  PLT 276 257  --   --    BMET  Recent Labs  02/14/14 0813  NA 144  K 4.1  CL 100  CO2 29  GLUCOSE 116*  BUN 56*  CREATININE 1.80*  CALCIUM 9.3   PT/INR  Recent Labs  02/14/14 0813 02/15/14 1907  LABPROT 16.7* 16.1*  INR 1.34 1.27   CMP     Component Value Date/Time   NA 144 02/14/2014 0813   K 4.1 02/14/2014 0813   CL 100 02/14/2014 0813   CO2 29 02/14/2014 0813   GLUCOSE 116* 02/14/2014 0813   BUN 56* 02/14/2014 0813   CREATININE 1.80* 02/14/2014 0813   CREATININE 1.07 09/29/2013 1132   CALCIUM 9.3 02/14/2014 0813   PROT 6.8 02/14/2014 0813   ALBUMIN 2.4* 02/14/2014 0813   AST 19 02/14/2014 0813   ALT 7 02/14/2014 0813   ALKPHOS 77 02/14/2014 0813   BILITOT 1.2 02/14/2014 0813   GFRNONAA 35* 02/14/2014 0813   GFRNONAA 68 09/29/2013 1132   GFRAA 41* 02/14/2014 0813   GFRAA 79 09/29/2013 1132   Lipase     Component Value Date/Time   LIPASE 9* 02/10/2014 1106        Studies/Results: Ct Abdomen Pelvis Wo Contrast  02/14/2014   CLINICAL DATA:  Perforated appendicitis with abscess. Re-evaluate for possible drainage  EXAM: CT ABDOMEN AND PELVIS WITHOUT CONTRAST  TECHNIQUE: Multidetector CT imaging of the abdomen and pelvis was performed following the standard protocol without IV contrast.  COMPARISON:  02/10/2014  FINDINGS: Lower chest: Bibasilar dependent bronchovascular nodular opacities could represent pneumonia or aspiration. Trace pleural effusions present. Heart is enlarged. Atherosclerosis of the lower thoracic aorta. No pericardial effusion. NG tube enters the stomach.  Abdomen: Small amount of perihepatic ascites. Liver, collapsed gallbladder, biliary system, or atrophic pancreas, spleen, adrenal glands, and kidneys are within normal limits for age and noncontrast imaging.  Oral contrast present throughout small and large bowel. Negative for obstruction or ileus.  No free air evident.  In the right lower quadrant, there is a slightly larger fluid collection with tiny gas bubbles inferior to the cecum measuring 5.1 x 4.1 cm. This would be amenable to percutaneous drainage. Slight thickening of the adjacent small bowel, suspect reactive enteritis. Findings compatible with perforated appendicitis and right lower quadrant abscess.  Pelvis: No significant pelvic fluid collection, free fluid, hemorrhage, abscess, adenopathy, or inguinal  abnormality. Small fat containing right inguinal hernia noted. Prostate gland is mildly enlarged.  Diffuse degenerative changes noted of the spine. No acute osseous finding.  IMPRESSION: Slight enlargement of the right lower quadrant pericecal air-fluid collection measuring 5.1 x 4.1 cm compatible with an abscess related to perforated appendicitis. This would be amenable to CT percutaneous drainage.  Adjacent small bowel enteritis in the right lower quadrant.  Negative for obstruction or free air.   Electronically Signed   By:  Daryll Brod M.D.   On: 02/14/2014 13:45   Ct Image Guided Drainage By Percutaneous Catheter  02/15/2014   CLINICAL DATA:  Right lower quadrant abscess  EXAM: CT IMAGE GUIDED DRAINAGE BY PERCUTANEOUS CATHETER  FLUOROSCOPY TIME:  Min  MEDICATIONS AND MEDICAL HISTORY: Versed 0.5 mg, Fentanyl 25 mcg.  Additional Medications: None.  ANESTHESIA/SEDATION: Moderate sedation time: 15 minutes  CONTRAST:  Min  PROCEDURE: The procedure, risks, benefits, and alternatives were explained to the patient. Questions regarding the procedure were encouraged and answered. The patient understands and consents to the procedure.  The right lower quadrant was prepped with Betadine in a sterile fashion, and a sterile drape was applied covering the operative field. A sterile gown and sterile gloves were used for the procedure.  Under CT guidance, an 18 gauge to inserted into the right lower quadrant abscess and removed over an Amplatz. A 12 French dilator followed by a 12 [place] drain were inserted. It was looped and string fixed in the abscess. 15 cc pus was aspirated.  FINDINGS: Images document 64 French drain placement into a right lower quadrant abscess.  COMPLICATIONS: None  IMPRESSION: Successful right lower quadrant abscess drain.   Electronically Signed   By: Maryclare Bean M.D.   On: 02/15/2014 11:45    Anti-infectives: Anti-infectives    Start     Dose/Rate Route Frequency Ordered Stop   02/12/14 2000  piperacillin-tazobactam (ZOSYN) IVPB 3.375 g     3.375 g12.5 mL/hr over 240 Minutes Intravenous Every 8 hours 02/12/14 1753     02/12/14 2000  vancomycin (VANCOCIN) IVPB 1000 mg/200 mL premix     1,000 mg200 mL/hr over 60 Minutes Intravenous Every 24 hours 02/12/14 1753     02/11/14 1700  vancomycin (VANCOCIN) IVPB 1000 mg/200 mL premix  Status:  Discontinued     1,000 mg200 mL/hr over 60 Minutes Intravenous Every 24 hours 02/10/14 1607 02/12/14 1753   02/10/14 2200  piperacillin-tazobactam (ZOSYN) IVPB 3.375 g  Status:   Discontinued     3.375 g12.5 mL/hr over 240 Minutes Intravenous Every 8 hours 02/10/14 1602 02/12/14 1753   02/10/14 1700  vancomycin (VANCOCIN) 1,250 mg in sodium chloride 0.9 % 250 mL IVPB     1,250 mg166.7 mL/hr over 90 Minutes Intravenous  Once 02/10/14 1605 02/10/14 1934   02/10/14 1345  piperacillin-tazobactam (ZOSYN) IVPB 3.375 g     3.375 g100 mL/hr over 30 Minutes Intravenous  Once 02/10/14 1344 02/10/14 1452       Assessment/Plan  1. Acute perforated appendicitis with abscess, s/p perc drain on 02-15-14  2. COPD 3. Chronic A fib 4. Anemia  Plan: 1. Patient now passing flatus.  Will DC NGT and give the patient clear liquids 2. cont abx therapy and perc chole drain.  Will need repeat CT scan in 5-7 days to evaluate this fluid collection 3. mobilize  LOS: 6 days    Linnell Swords E 02/16/2014, 8:44 AM Pager: [phone]

## 2014-02-16 NOTE — Progress Notes (Signed)
Referring Physician(s): CCS  Subjective:  RLQ abscess drain placed 12/6 Better today Clear liquids now  Allergies: Aspirin  Medications: Prior to Admission medications   Medication Sig Start Date End Date Taking? Authorizing Provider  albuterol (PROAIR HFA) 108 (90 BASE) MCG/ACT inhaler Inhale 2 puffs into the lungs every 6 (six) hours as needed for wheezing or shortness of breath. 05/23/13  Yes Fayrene Helper, MD  albuterol (PROVENTIL) (2.5 MG/3ML) 0.083% nebulizer solution Take 2.5 mg by nebulization every 8 (eight) hours as needed for wheezing or shortness of breath.   Yes Historical Provider, MD  allopurinol (ZYLOPRIM) 300 MG tablet Take 1 tablet (300 mg total) by mouth daily. 05/23/13  Yes Fayrene Helper, MD  ALPRAZolam Duanne Moron) 0.25 MG tablet Take 0.25 mg by mouth daily as needed for anxiety.   Yes Historical Provider, MD  brimonidine-timolol (COMBIGAN) 0.2-0.5 % ophthalmic solution Place 1 drop into both eyes every 12 (twelve) hours.   Yes Historical Provider, MD  budesonide-formoterol (SYMBICORT) 160-4.5 MCG/ACT inhaler Inhale 2 puffs into the lungs 2 (two) times daily. 05/23/13  Yes Fayrene Helper, MD  diltiazem (CARDIZEM CD) 180 MG 24 hr capsule Take 1 capsule (180 mg total) by mouth daily. 09/22/13  Yes Herminio Commons, MD  mirtazapine (REMERON) 15 MG tablet Take 1 tablet (15 mg total) by mouth at bedtime. 12/30/13  Yes Fayrene Helper, MD  montelukast (SINGULAIR) 10 MG tablet Take 10 mg by mouth at bedtime.   Yes Historical Provider, MD  potassium chloride (K-DUR) 10 MEQ tablet Take 10 mEq by mouth 2 (two) times daily.  06/13/13  Yes Tanna Furry, MD  tiotropium (SPIRIVA) 18 MCG inhalation capsule Place 18 mcg into inhaler and inhale daily.   Yes Historical Provider, MD  torsemide (DEMADEX) 20 MG tablet Take 2 tablets (40 mg total) by mouth daily. 10/02/13  Yes Herminio Commons, MD  traMADol (ULTRAM) 50 MG tablet Take 50 mg by mouth daily as needed (pain).   Yes  Historical Provider, MD  travoprost, benzalkonium, (TRAVATAN) 0.004 % ophthalmic solution Place 1 drop into both eyes at bedtime.   Yes Historical Provider, MD  traZODone (DESYREL) 50 MG tablet Take 0.5-1 tablets (25-50 mg total) by mouth at bedtime as needed for sleep. 03/13/14  Yes Fayrene Helper, MD  warfarin (COUMADIN) 4 MG tablet Take 1.5 tabs (6 mg) on Monday and Thursday and 1 tab (4mg ) on other days Patient taking differently: Take 4-6 mg by mouth daily. Take 1.5 tabs (6 mg) on Monday and Thursday and 1 tab (4mg ) on other days 02/09/14  Yes Herminio Commons, MD  albuterol (PROVENTIL) (2.5 MG/3ML) 0.083% nebulizer solution INHALE THE CONTENTS OF 1 VIAL USING THE NEBULIZER EVERY 6 HOURS AS NEEDED Patient not taking: Reported on 02/10/2014 01/13/14   Fayrene Helper, MD  traMADol (ULTRAM) 50 MG tablet TAKE 1 TABLET BY MOUTH EVERY DAY AS NEEDED Patient not taking: Reported on 02/10/2014 11/19/13   Fayrene Helper, MD    Review of Systems  Vital Signs: BP 118/60 mmHg  Pulse 81  Temp(Src) 97.6 F (36.4 C) (Oral)  Resp 18  Ht 5\' 6"  (1.676 m)  Wt 82.056 kg (180 lb 14.4 oz)  BMI 29.21 kg/m2  SpO2 100%  Physical Exam  Abdominal:  Site of RLQ abscess drain NT; no bleeding No sign of infection Output cloudy brownish color 40 cc yesterday 20 cc in JP Wbc 12.8 (17.2)    Imaging: Ct Abdomen Pelvis Wo Contrast  02/14/2014   CLINICAL DATA:  Perforated appendicitis with abscess. Re-evaluate for possible drainage  EXAM: CT ABDOMEN AND PELVIS WITHOUT CONTRAST  TECHNIQUE: Multidetector CT imaging of the abdomen and pelvis was performed following the standard protocol without IV contrast.  COMPARISON:  02/10/2014  FINDINGS: Lower chest: Bibasilar dependent bronchovascular nodular opacities could represent pneumonia or aspiration. Trace pleural effusions present. Heart is enlarged. Atherosclerosis of the lower thoracic aorta. No pericardial effusion. NG tube enters the stomach.  Abdomen:  Small amount of perihepatic ascites. Liver, collapsed gallbladder, biliary system, or atrophic pancreas, spleen, adrenal glands, and kidneys are within normal limits for age and noncontrast imaging.  Oral contrast present throughout small and large bowel. Negative for obstruction or ileus.  No free air evident.  In the right lower quadrant, there is a slightly larger fluid collection with tiny gas bubbles inferior to the cecum measuring 5.1 x 4.1 cm. This would be amenable to percutaneous drainage. Slight thickening of the adjacent small bowel, suspect reactive enteritis. Findings compatible with perforated appendicitis and right lower quadrant abscess.  Pelvis: No significant pelvic fluid collection, free fluid, hemorrhage, abscess, adenopathy, or inguinal abnormality. Small fat containing right inguinal hernia noted. Prostate gland is mildly enlarged.  Diffuse degenerative changes noted of the spine. No acute osseous finding.  IMPRESSION: Slight enlargement of the right lower quadrant pericecal air-fluid collection measuring 5.1 x 4.1 cm compatible with an abscess related to perforated appendicitis. This would be amenable to CT percutaneous drainage.  Adjacent small bowel enteritis in the right lower quadrant.  Negative for obstruction or free air.   Electronically Signed   By: Daryll Brod M.D.   On: 02/14/2014 13:45   Dg Chest 2 View  02/13/2014   CLINICAL DATA:  Chest pain, cough, shortness of Breath  EXAM: CHEST  2 VIEW  COMPARISON:  02/11/2014  FINDINGS: Cardiomediastinal silhouette is stable. NG tube in place with tip in mid stomach. Contrast material from recent CT scan noted within left colon. Mild left basilar atelectasis. Mild degenerative changes thoracic spine. No acute infiltrate or pulmonary edema.  IMPRESSION: No acute infiltrate or pulmonary edema. Mild left basilar atelectasis. NG tube in place.   Electronically Signed   By: Lahoma Crocker M.D.   On: 02/13/2014 11:08   Dg Abd Portable  1v  02/12/2014   CLINICAL DATA:  NG tube placement  EXAM: PORTABLE ABDOMEN - 1 VIEW  COMPARISON:  04/2013  FINDINGS: NG tube has been inserted into the proximal stomach. Minimal decrease in gastric distention. Dilated small bowel persists in the mid abdomen with a small bowel diameter of 5.8 cm. Contrast within the colon on the right. Heart appears enlarged. Left lower lobe streaky airspace process compatible with pneumonia or aspiration.  IMPRESSION: NGT within stomach. Minimal improving gastric distention and Stable small bowel distention.   Electronically Signed   By: Daryll Brod M.D.   On: 02/12/2014 14:33   Ct Image Guided Drainage By Percutaneous Catheter  02/15/2014   CLINICAL DATA:  Right lower quadrant abscess  EXAM: CT IMAGE GUIDED DRAINAGE BY PERCUTANEOUS CATHETER  FLUOROSCOPY TIME:  Min  MEDICATIONS AND MEDICAL HISTORY: Versed 0.5 mg, Fentanyl 25 mcg.  Additional Medications: None.  ANESTHESIA/SEDATION: Moderate sedation time: 15 minutes  CONTRAST:  Min  PROCEDURE: The procedure, risks, benefits, and alternatives were explained to the patient. Questions regarding the procedure were encouraged and answered. The patient understands and consents to the procedure.  The right lower quadrant was prepped with Betadine in a sterile  fashion, and a sterile drape was applied covering the operative field. A sterile gown and sterile gloves were used for the procedure.  Under CT guidance, an 18 gauge to inserted into the right lower quadrant abscess and removed over an Amplatz. A 12 French dilator followed by a 12 [place] drain were inserted. It was looped and string fixed in the abscess. 15 cc pus was aspirated.  FINDINGS: Images document 39 French drain placement into a right lower quadrant abscess.  COMPLICATIONS: None  IMPRESSION: Successful right lower quadrant abscess drain.   Electronically Signed   By: Maryclare Bean M.D.   On: 02/15/2014 11:45    Labs:  CBC:  Recent Labs  02/13/14 0103   02/13/14 2350 02/14/14 0813 02/14/14 1850 02/15/14 1907 02/16/14 0755  WBC 16.0*  --  17.6* 17.2*  --   --  12.8*  HGB 8.4*  < > 8.7* 8.7* 8.9* 8.1* 7.5*  HCT 26.0*  < > 26.3* 27.3* 27.3* 24.9* 23.6*  PLT 257  --  276 257  --   --  258  < > = values in this interval not displayed.  COAGS:  Recent Labs  04/02/13 0725  11/19/13 1448  02/13/14 0103 02/14/14 0813 02/15/14 1907 02/16/14 0755  INR 1.54*  < > 1.25  < > 1.75* 1.34 1.27 1.28  APTT 36  --  42*  --   --   --   --   --   < > = values in this interval not displayed.  BMP:  Recent Labs  02/12/14 1120 02/13/14 0103 02/14/14 0813 02/16/14 0755  NA 138 140 144 142  K 4.1 4.0 4.1 4.0  CL 94* 96 100 101  CO2 30 28 29 31   GLUCOSE 86 94 116* 106*  BUN 37* 42* 56* 27*  CALCIUM 9.0 9.2 9.3 8.7  CREATININE 1.75* 1.69* 1.80* 1.20  GFRNONAA 37* 38* 35* 58*  GFRAA 42* 44* 41* 67*    LIVER FUNCTION TESTS:  Recent Labs  02/10/14 1106 02/11/14 0554 02/14/14 0813 02/16/14 0755  BILITOT 1.3* 1.0 1.2 1.5*  AST 18 15 19 19   ALT 6 6 7 7   ALKPHOS 127* 98 77 83  PROT 8.0 7.2 6.8 6.2  ALBUMIN 3.0* 2.6* 2.4* 2.2*    Assessment and Plan:  RLQ abscess drain intact Will follow    I spent a total of 15 minutes face to face in clinical consultation/evaluation, greater than 50% of which was counseling/coordinating care for RLQ abscess drain  Signed: Chistina Roston A 02/16/2014, 1:51 PM

## 2014-02-16 NOTE — Progress Notes (Signed)
Subjective:  No CP. Some dyspnea from time to time but okay now. Continues to have abdominal discomfort.    Objective:  Filed Vitals:   02/15/14 1305 02/15/14 2153 02/15/14 2201 02/16/14 0550  BP: 122/68 104/72  97/65  Pulse: 105 115 80 94  Temp: 97.9 F (36.6 C) 98.6 F (37 C)  98.4 F (36.9 C)  TempSrc: Oral Oral  Oral  Resp: 20 20 20 20   Height:      Weight:    180 lb 14.4 oz (82.056 kg)  SpO2: 98% 98%  100%    Intake/Output from previous day:  Intake/Output Summary (Last 24 hours) at 02/16/14 0825 Last data filed at 02/16/14 0600  Gross per 24 hour  Intake    887 ml  Output    145 ml  Net    742 ml    Physical Exam: Physical exam: Well-developed chronically ill appearing in no acute distress.  Skin is warm and dry.  HEENT is normal.  Neck is supple.  Chest is clear to auscultation with normal expansion.  Cardiovascular exam is irregular Abdominal exam mild tenderness to palpation Extremities show no edema. neuro grossly intact    Lab Results: Basic Metabolic Panel:  Recent Labs  02/14/14 0813  NA 144  K 4.1  CL 100  CO2 29  GLUCOSE 116*  BUN 56*  CREATININE 1.80*  CALCIUM 9.3   CBC:  Recent Labs  02/13/14 2350 02/14/14 0813 02/14/14 1850 02/15/14 1907  WBC 17.6* 17.2*  --   --   HGB 8.7* 8.7* 8.9* 8.1*  HCT 26.3* 27.3* 27.3* 24.9*  MCV 86.5 84.5  --   --   PLT 276 257  --   --    TELE- afib with rate control and freq ventricular ectopy.    Assessment/Plan:   Theodore [name] is an 73 y.o. male with a hx of COPD on home 02, chronic atrial fibrillation on coumadin, chronic diastolic CHF and HTN who was admitted on 02/10/14 with a perforated appendicitis and abscess. Cardiology was consulted for pre op clearance.   Preoperative evaluation prior to appendectomy-Patient will be at increased risk for surgery given baseline medical problems including severe home O2 dependent COPD per Dr. Stanford Breed.  -- Nuclear study in May 2015 showed  no ischemia and EF 50%.  -- His chest pain here is atypical. Enzymes are negative.  -- Would not pursue further cardiac evaluation preoperatively.  Severe COPD-on home O -- Recommended that pulmonary to follow and preoperative evaluation.  Chronic atrial fibrillation-continue Cardizem for rate control. Can change to IV if he cannot take oral medications. -- Coumadin on hold. INR 1.27. Resume anticoagulation postoperatively once okay with surgery.  Acute renal failure-BUN (56) and creatinine (1.8) increased. Would continue to hold diuretics. Most likely from nothing by mouth status.  Anemia- H/H 8.1/24.9: further evaluation per primary service.  Ruptured appendix with abscess-continue antibiotics. General surgery following. -- Pt is s/p drain placement by IR  THOMPSON, KATHRYN R  PA-C 02/16/2014, 8:25 AM   Agree with note by Nell Range PA-C  Pt with CAF on oral AC. Currently on hold. Appendiceal abscess with perc drain. Cleared for surgery by Dr. Stanford Breed at moderately increased risk secondary to co morbidities. Neg functional study. Exam benign. Will follow along with you.  Lorretta Harp, M.D., [place], [facility], [place] [address] [place], [place]  [id number]  [phone] 02/16/2014 1:58 PM

## 2014-02-16 NOTE — Progress Notes (Signed)
[facility] PROGRESS NOTE  Theodore [name] SEG:[id number] DOB: [birth year] DOA: 02/10/2014 PCP: Tula Nakayama, MD  Assessment/Plan:  1. Perforated appendix with abscess/sepsis; CT: Findings are concerning for ruptured appendicitis with an associated approximately 4.5 x 3.2 cm abscess  - General surgery on board and assisting with management. - Cardiology consulted for surgical clearance, currently no plans for further cardiac testing - Pt is s/p drain placement by IR, plan is to repeat CT scan in 5-7 days to evaluate fluid collection - Continue broad spectrum antibiotics - agree with mobilizing.  2. CHF, diastolic HF; echo (7371): LVEF 06%, diastolic dysfunction, pulmonary HTN PASP 74, TR -Compensated currently -gentle MIVF while NPO; NG  3. COPD on home oxygen; no wheezing on exam; cont supplemental oxygen, bronchodilators as needed   4. CT: Bilateral lower lobe bronchiectatic changes and consolidation are suspicious for chronic aspiration pneumonitis -Speech therapy evaluation  5. A. fib on Coumadin; HR is stable; coumadin on hold for procedure. INR currently trending down  6. AKI on CKD II in the setting of sepsis; likely ATN;  -gentle IVF, close monitor I/O, daily weight; urine output   7. Acute anemia - stable with last value at 8.7  Code Status: stable Family Communication: None at bedside Disposition Plan: Pending recommendations from specialist involved   Consultants:  General surgery  IR  Cardiology  Procedures:  pending  Antibiotics:  Vancomycin and zosyn  HPI/Subjective: Pt has no new complaints. No acute issues reported overnight.  Objective: Filed Vitals:   02/16/14 1330  BP: 118/60  Pulse: 81  Temp: 97.6 F (36.4 C)  Resp: 18    Intake/Output Summary (Last 24 hours) at 02/16/14 1800 Last data filed at 02/16/14 1746  Gross per 24 hour  Intake 2451.75 ml  Output    315 ml  Net 2136.75 ml   Filed Weights   02/14/14 0548  02/15/14 0538 02/16/14 0550  Weight: 76.386 kg (168 lb 6.4 oz) 67.223 kg (148 lb 3.2 oz) 82.056 kg (180 lb 14.4 oz)    Exam:   General:  Pt in nad, alert and awake  Cardiovascular: s1 and s2 present, no mrg  Respiratory: cta bl, no wheezes  Abdomen: soft, pain on palpation , no guarding  Musculoskeletal: no clubbing or cyanosis   Data Reviewed: Basic Metabolic Panel:  Recent Labs Lab 02/11/14 0554 02/12/14 1120 02/13/14 0103 02/14/14 0813 02/16/14 0755  NA 138 138 140 144 142  K 4.4 4.1 4.0 4.1 4.0  CL 94* 94* 96 100 101  CO2 34* 30 28 29 31   GLUCOSE 130* 86 94 116* 106*  BUN 29* 37* 42* 56* 27*  CREATININE 1.70* 1.75* 1.69* 1.80* 1.20  CALCIUM 9.0 9.0 9.2 9.3 8.7   Liver Function Tests:  Recent Labs Lab 02/10/14 1106 02/11/14 0554 02/14/14 0813 02/16/14 0755  AST 18 15 19 19   ALT 6 6 7 7   ALKPHOS 127* 98 77 83  BILITOT 1.3* 1.0 1.2 1.5*  PROT 8.0 7.2 6.8 6.2  ALBUMIN 3.0* 2.6* 2.4* 2.2*    Recent Labs Lab 02/10/14 1106  LIPASE 9*   No results for input(s): AMMONIA in the last 168 hours. CBC:  Recent Labs Lab 02/10/14 1106  02/12/14 1120  02/13/14 0103  02/13/14 2350 02/14/14 0813 02/14/14 1850 02/15/14 1907 02/16/14 0755  WBC 26.5*  --  17.9*  --  16.0*  --  17.6* 17.2*  --   --  12.8*  NEUTROABS 24.4*  --   --   --   --   --   --   --   --   --   --  HGB 11.0*  < > 8.9*  < > 8.4*  < > 8.7* 8.7* 8.9* 8.1* 7.5*  HCT 34.3*  < > 28.3*  < > 26.0*  < > 26.3* 27.3* 27.3* 24.9* 23.6*  MCV 91.2  --  87.1  --  86.1  --  86.5 84.5  --   --  84.6  PLT 341  --  266  --  257  --  276 257  --   --  258  < > = values in this interval not displayed. Cardiac Enzymes:  Recent Labs Lab 02/12/14 1904 02/13/14 0103 02/13/14 0615  TROPONINI <0.30 <0.30 <0.30   BNP (last 3 results)  Recent Labs  06/16/13 1554 06/17/13 0537 07/09/13 0555  PROBNP 3560.0* 3222.0* 3842.0*   CBG: No results for input(s): GLUCAP in the last 168 hours.  Recent  Results (from the past 240 hour(s))  Blood culture (routine x 2)     Status: None   Collection Time: 02/10/14  1:50 PM  Result Value Ref Range Status   Specimen Description BLOOD RIGHT ANTECUBITAL  Final   Special Requests BOTTLES DRAWN AEROBIC AND ANAEROBIC [facility]  Final   Culture  Setup Time   Final    02/13/2014 23:00 Performed at [facility]    Culture   Final    BACTEROIDES THETAIOTAOMICRON Note: CRITICAL RESULT CALLED TO, READ BACK BY AND VERIFIED WITH: N CAGUIOA AT 2319 02/13/14 S VANHOORNE BETA LACTAMASE NEGATIVE Performed at [facility]    Report Status 02/15/2014 FINAL  Final  Blood culture (routine x 2)     Status: None   Collection Time: 02/10/14  2:10 PM  Result Value Ref Range Status   Specimen Description BLOOD RIGHT ARM  Final   Special Requests   Final    BOTTLES DRAWN AEROBIC AND ANAEROBIC AEB=8CC ANA=6CC   Culture NO GROWTH 5 DAYS  Final   Report Status 02/15/2014 FINAL  Final     Studies: Ct Image Guided Drainage By Percutaneous Catheter  02/15/2014   CLINICAL DATA:  Right lower quadrant abscess  EXAM: CT IMAGE GUIDED DRAINAGE BY PERCUTANEOUS CATHETER  FLUOROSCOPY TIME:  Min  MEDICATIONS AND MEDICAL HISTORY: Versed 0.5 mg, Fentanyl 25 mcg.  Additional Medications: None.  ANESTHESIA/SEDATION: Moderate sedation time: 15 minutes  CONTRAST:  Min  PROCEDURE: The procedure, risks, benefits, and alternatives were explained to the patient. Questions regarding the procedure were encouraged and answered. The patient understands and consents to the procedure.  The right lower quadrant was prepped with Betadine in a sterile fashion, and a sterile drape was applied covering the operative field. A sterile gown and sterile gloves were used for the procedure.  Under CT guidance, an 18 gauge to inserted into the right lower quadrant abscess and removed over an Amplatz. A 12 French dilator followed by a 12 [place] drain were inserted. It was looped and string fixed in the  abscess. 15 cc pus was aspirated.  FINDINGS: Images document 29 French drain placement into a right lower quadrant abscess.  COMPLICATIONS: None  IMPRESSION: Successful right lower quadrant abscess drain.   Electronically Signed   By: Maryclare Bean M.D.   On: 02/15/2014 11:45    Scheduled Meds: . sodium chloride   Intravenous Once  . allopurinol  300 mg Oral Daily  . antiseptic oral rinse  7 mL Mouth Rinse BID  . brimonidine  1 drop Both Eyes Q12H   And  . timolol  1 drop Both Eyes  Q12H  . budesonide-formoterol  2 puff Inhalation BID  . diltiazem  180 mg Oral Daily  . heparin subcutaneous  5,000 Units Subcutaneous 3 times per day  . latanoprost  1 drop Both Eyes QHS  . montelukast  10 mg Oral QHS  . pantoprazole (PROTONIX) IV  40 mg Intravenous Q12H  . piperacillin-tazobactam (ZOSYN)  IV  3.375 g Intravenous Q8H  . sodium chloride  3 mL Intravenous Q12H  . tiotropium  18 mcg Inhalation Daily  . vancomycin  1,000 mg Intravenous Q24H  . Warfarin - Pharmacist Dosing Inpatient   Does not apply q1800   Continuous Infusions: . dextrose 5 % and 0.45 % NaCl with KCl 20 mEq/L 75 mL/hr at 02/15/14 1514     Time spent: > 35 minutes    [name]  [facility] Pager [phone]  If 7PM-7AM, please contact night-coverage at [email or web address], password [employer] 02/16/2014, 6:00 PM  LOS: 6 days

## 2014-02-16 NOTE — Progress Notes (Signed)
ANTICOAGULATION CONSULT NOTE - Initial Consult  Pharmacy Consult for Coumadin Indication: atrial fibrillation  Allergies  Allergen Reactions  . Aspirin Other (See Comments)    On coumadin    Patient Measurements: Height: 5\' 6"  (167.6 cm) Weight: 180 lb 14.4 oz (82.056 kg) IBW/kg (Calculated) : 63.8  Vital Signs: Temp: 97.6 F (36.4 C) (12/07 1330) Temp Source: Oral (12/07 1330) BP: 118/60 mmHg (12/07 1330) Pulse Rate: 81 (12/07 1330)  Labs:  Recent Labs  02/13/14 2350 02/14/14 0813 02/14/14 1850 02/15/14 1907 02/16/14 0755  HGB 8.7* 8.7* 8.9* 8.1* 7.5*  HCT 26.3* 27.3* 27.3* 24.9* 23.6*  PLT 276 257  --   --  258  LABPROT  --  16.7*  --  16.1* 16.2*  INR  --  1.34  --  1.27 1.28  CREATININE  --  1.80*  --   --  1.20    Estimated Creatinine Clearance: 54.3 mL/min (by C-G formula based on Cr of 1.2).   Medical History: Past Medical History  Diagnosis Date  . Glaucoma     uses eye drops daily  . Allergic rhinitis     takes Singulair at bedtime  . Atrial fibrillation     takes Coumadin daily  . Diastolic heart failure     LVEF 38-75%, rate 2 diastolic dysfunction 08/4330  . Pulmonary hypertension     70 mmHg 03/2012  . Asthma     Albuterol neb and inhaler as needed  . Anxiety     takes Xanax daily as needed  . COPD (chronic obstructive pulmonary disease)     Symbicort daily  . Essential hypertension, benign     takes Metoprolol and Diltiazem daily  . Anemia     takes ferrous sulfate daily  . History of bronchitis   . Shortness of breath     with exertion  . Numbness     fingertips on both hands  . Lung nodules   . History of blood transfusion     Medications:  Prescriptions prior to admission  Medication Sig Dispense Refill Last Dose  . albuterol (PROAIR HFA) 108 (90 BASE) MCG/ACT inhaler Inhale 2 puffs into the lungs every 6 (six) hours as needed for wheezing or shortness of breath. 6.7 g 4 02/10/2014 at Unknown time  . albuterol (PROVENTIL)  (2.5 MG/3ML) 0.083% nebulizer solution Take 2.5 mg by nebulization every 8 (eight) hours as needed for wheezing or shortness of breath.   02/10/2014 at Unknown time  . allopurinol (ZYLOPRIM) 300 MG tablet Take 1 tablet (300 mg total) by mouth daily. 30 tablet 6 02/09/2014 at Unknown time  . ALPRAZolam (XANAX) 0.25 MG tablet Take 0.25 mg by mouth daily as needed for anxiety.   02/09/2014 at Unknown time  . brimonidine-timolol (COMBIGAN) 0.2-0.5 % ophthalmic solution Place 1 drop into both eyes every 12 (twelve) hours.   02/09/2014 at Unknown time  . budesonide-formoterol (SYMBICORT) 160-4.5 MCG/ACT inhaler Inhale 2 puffs into the lungs 2 (two) times daily. 1 Inhaler 4 02/10/2014 at Unknown time  . diltiazem (CARDIZEM CD) 180 MG 24 hr capsule Take 1 capsule (180 mg total) by mouth daily. 30 capsule 6 02/09/2014 at Unknown time  . mirtazapine (REMERON) 15 MG tablet Take 1 tablet (15 mg total) by mouth at bedtime. 30 tablet 4 02/09/2014 at Unknown time  . montelukast (SINGULAIR) 10 MG tablet Take 10 mg by mouth at bedtime.   02/09/2014 at Unknown time  . potassium chloride (K-DUR) 10 MEQ tablet Take 10  mEq by mouth 2 (two) times daily.    02/09/2014 at Unknown time  . tiotropium (SPIRIVA) 18 MCG inhalation capsule Place 18 mcg into inhaler and inhale daily.   02/10/2014 at Unknown time  . torsemide (DEMADEX) 20 MG tablet Take 2 tablets (40 mg total) by mouth daily. 60 tablet 3 02/09/2014 at Unknown time  . traMADol (ULTRAM) 50 MG tablet Take 50 mg by mouth daily as needed (pain).   02/09/2014 at Unknown time  . travoprost, benzalkonium, (TRAVATAN) 0.004 % ophthalmic solution Place 1 drop into both eyes at bedtime.   02/09/2014 at Unknown time  . [START ON 03/13/2014] traZODone (DESYREL) 50 MG tablet Take 0.5-1 tablets (25-50 mg total) by mouth at bedtime as needed for sleep. 30 tablet 5 02/09/2014 at Unknown time  . warfarin (COUMADIN) 4 MG tablet Take 1.5 tabs (6 mg) on Monday and Thursday and 1 tab (4mg ) on  other days (Patient taking differently: Take 4-6 mg by mouth daily. Take 1.5 tabs (6 mg) on Monday and Thursday and 1 tab (4mg ) on other days) 35 tablet 3 02/09/2014 at Unknown time  . albuterol (PROVENTIL) (2.5 MG/3ML) 0.083% nebulizer solution INHALE THE CONTENTS OF 1 VIAL USING THE NEBULIZER EVERY 6 HOURS AS NEEDED (Patient not taking: Reported on 02/10/2014) 375 mL 0 Taking  . traMADol (ULTRAM) 50 MG tablet TAKE 1 TABLET BY MOUTH EVERY DAY AS NEEDED (Patient not taking: Reported on 02/10/2014) 30 tablet 3 Taking    Assessment: 73 y.o. male known to pharmacy from antibiotic dosing this admission. S/p perc drain 02/15/14 for appendiceal abscess. Coumadin reversed 12/3 with vit K 5mg  SQ and remained on hold for possible further surgery but pt doing well and CCS ok with restarting coumadin as doesn't appear pt will need more surgery. INR down to 1.28. Home dose 4mg  daily except for 6mg  on Mon and Thur (admit INR 2.51). Hgb 7.5 - will watch. Plt stable. Pt also on SQ heparin for VTE prophylaxis while coumadin being held.  Goal of Therapy:  INR 2-3 Monitor platelets by anticoagulation protocol: Yes   Plan:  1. Coumadin 6mg  today 2. Daily INR 3. Will d/c SQ heparin when INR >/= 2  Sherlon Handing, PharmD, BCPS Clinical pharmacist, pager [phone] 02/16/2014,1:59 PM

## 2014-02-17 LAB — PROTIME-INR
INR: 1.34 (ref 0.00–1.49)
PROTHROMBIN TIME: 16.7 s — AB (ref 11.6–15.2)

## 2014-02-17 LAB — BASIC METABOLIC PANEL
Anion gap: 10 (ref 5–15)
BUN: 20 mg/dL (ref 6–23)
CO2: 28 mEq/L (ref 19–32)
Calcium: 8.6 mg/dL (ref 8.4–10.5)
Chloride: 99 mEq/L (ref 96–112)
Creatinine, Ser: 1.03 mg/dL (ref 0.50–1.35)
GFR calc Af Amer: 81 mL/min — ABNORMAL LOW (ref >90)
GFR, EST NON AFRICAN AMERICAN: 69 mL/min — AB (ref >90)
Glucose, Bld: 115 mg/dL — ABNORMAL HIGH (ref 70–99)
POTASSIUM: 3.6 meq/L — AB (ref 3.7–5.3)
Sodium: 137 mEq/L (ref 137–147)

## 2014-02-17 LAB — CBC
HCT: 22.3 % — ABNORMAL LOW (ref 39.0–52.0)
Hemoglobin: 7.3 g/dL — ABNORMAL LOW (ref 13.0–17.0)
MCH: 28.5 pg (ref 26.0–34.0)
MCHC: 32.7 g/dL (ref 30.0–36.0)
MCV: 87.1 fL (ref 78.0–100.0)
Platelets: 255 10*3/uL (ref 150–400)
RBC: 2.56 MIL/uL — ABNORMAL LOW (ref 4.22–5.81)
RDW: 16.8 % — AB (ref 11.5–15.5)
WBC: 10.6 10*3/uL — ABNORMAL HIGH (ref 4.0–10.5)

## 2014-02-17 LAB — HEMOGLOBIN AND HEMATOCRIT, BLOOD
HCT: 25.5 % — ABNORMAL LOW (ref 39.0–52.0)
HCT: 24.8 % — ABNORMAL LOW (ref 39.0–52.0)
Hemoglobin: 8.3 g/dL — ABNORMAL LOW (ref 13.0–17.0)
Hemoglobin: 7.8 g/dL — ABNORMAL LOW (ref 13.0–17.0)

## 2014-02-17 MED ORDER — WARFARIN SODIUM 6 MG PO TABS
6.0000 mg | ORAL_TABLET | Freq: Once | ORAL | Status: AC
  Administered 2014-02-17: 6 mg via ORAL
  Filled 2014-02-17: qty 1

## 2014-02-17 MED ORDER — SODIUM CHLORIDE 0.9 % IV SOLN
3.0000 g | Freq: Four times a day (QID) | INTRAVENOUS | Status: DC
  Administered 2014-02-17 – 2014-02-25 (×32): 3 g via INTRAVENOUS
  Filled 2014-02-17 (×35): qty 3

## 2014-02-17 NOTE — Progress Notes (Signed)
Patient Profile: Theodore [name] is an 73 y.o. male with a hx of COPD on home 02, chronic atrial fibrillation on coumadin, chronic diastolic CHF and HTN who was admitted on 02/10/14 with a perforated appendicitis and abscess. S/p perc drain on 02-15-14  Subjective: No major complaints. Abdomen still distended. He is passing flatus and reports having a small BM this am. No chest pain or dyspnea. Denies palpitations.   Objective: Vital signs in last 24 hours: Temp:  [97.3 F (36.3 C)-97.8 F (36.6 C)] 97.3 F (36.3 C) (12/08 0459) Pulse Rate:  [52-93] 54 (12/08 0459) Resp:  [18] 18 (12/07 2143) BP: (97-118)/(47-61) 97/47 mmHg (12/08 0459) SpO2:  [98 %-100 %] 98 % (12/08 1033) Weight:  [185 lb 11.2 oz (84.233 kg)] 185 lb 11.2 oz (84.233 kg) (12/08 0625) Last BM Date: 02/14/14  Intake/Output from previous day: 12/07 0701 - 12/08 0700 In: 2848.5 [P.O.:1056; I.V.:1427.5; IV Piggyback:350] Out: 725 [Urine:700; Drains:25] Intake/Output this shift:    Medications Current Facility-Administered Medications  Medication Dose Route Frequency Provider Last Rate Last Dose  . 0.9 %  sodium chloride infusion   Intravenous Once Kinnie Feil, MD      . acetaminophen (TYLENOL) tablet 650 mg  650 mg Oral Q6H PRN Radene Gunning, NP   650 mg at 02/10/14 1635   Or  . acetaminophen (TYLENOL) suppository 650 mg  650 mg Rectal Q6H PRN Radene Gunning, NP      . albuterol (PROVENTIL) (2.5 MG/3ML) 0.083% nebulizer solution 2.5 mg  2.5 mg Nebulization Q8H PRN Radene Gunning, NP   2.5 mg at 02/17/14 1033  . allopurinol (ZYLOPRIM) tablet 300 mg  300 mg Oral Daily Lezlie Octave Black, NP   300 mg at 02/17/14 1032  . ALPRAZolam Duanne Moron) tablet 0.25 mg  0.25 mg Oral Daily PRN Radene Gunning, NP      . alum & mag hydroxide-simeth (MAALOX/MYLANTA) 200-200-20 MG/5ML suspension 30 mL  30 mL Oral Q6H PRN Radene Gunning, NP      . Ampicillin-Sulbactam (UNASYN) 3 g in sodium chloride 0.9 % 100 mL IVPB  3 g Intravenous Q6H Juanda Chance [place], [facility]      . antiseptic oral rinse (CPC / CETYLPYRIDINIUM CHLORIDE 0.05%) solution 7 mL  7 mL Mouth Rinse BID Kinnie Feil, MD   7 mL at 02/17/14 1034  . brimonidine (ALPHAGAN) 0.2 % ophthalmic solution 1 drop  1 drop Both Eyes Q12H Donne Hazel, MD   1 drop at 02/17/14 1033   And  . timolol (TIMOPTIC) 0.5 % ophthalmic solution 1 drop  1 drop Both Eyes Q12H Donne Hazel, MD   1 drop at 02/17/14 1033  . budesonide-formoterol (SYMBICORT) 160-4.5 MCG/ACT inhaler 2 puff  2 puff Inhalation BID Radene Gunning, NP   2 puff at 02/17/14 1029  . dextrose 5 % and 0.45 % NaCl with KCl 20 mEq/L infusion   Intravenous Continuous Earnstine Regal, PA-C 75 mL/hr at 02/17/14 0013    . diltiazem (CARDIZEM CD) 24 hr capsule 180 mg  180 mg Oral Daily Radene Gunning, NP   Stopped at 02/17/14 1032  . heparin injection 5,000 Units  5,000 Units Subcutaneous 3 times per day [name], MD   5,000 Units at 02/17/14 0504  . HYDROcodone-acetaminophen (NORCO/VICODIN) 5-325 MG per tablet 1-2 tablet  1-2 tablet Oral Q4H PRN Radene Gunning, NP   2 tablet at 02/16/14 1154  . latanoprost (XALATAN) 0.005 % ophthalmic  solution 1 drop  1 drop Both Eyes QHS Donne Hazel, MD   1 drop at 02/16/14 2119  . montelukast (SINGULAIR) tablet 10 mg  10 mg Oral QHS Radene Gunning, NP   10 mg at 02/16/14 2118  . morphine 2 MG/ML injection 1 mg  1 mg Intravenous Q2H PRN Radene Gunning, NP   1 mg at 02/15/14 1913  . ondansetron (ZOFRAN) tablet 4 mg  4 mg Oral Q6H PRN Radene Gunning, NP       Or  . ondansetron [facility]) injection 4 mg  4 mg Intravenous Q6H PRN Radene Gunning, NP      . pantoprazole (PROTONIX) injection 40 mg  40 mg Intravenous Q12H Lutricia Feil, PA   40 mg at 02/17/14 1033  . sodium chloride 0.9 % injection 3 mL  3 mL Intravenous Q12H Radene Gunning, NP   3 mL at 02/17/14 1033  . tiotropium (SPIRIVA) inhalation capsule 18 mcg  18 mcg Inhalation Daily Radene Gunning, NP   18 mcg at 02/17/14 2831  . traZODone  (DESYREL) tablet 25-50 mg  25-50 mg Oral QHS PRN Radene Gunning, NP   50 mg at 02/15/14 2221  . warfarin (COUMADIN) tablet 6 mg  6 mg Oral ONCE-1800 Juanda Chance [place], [facility]      . Warfarin - Pharmacist Dosing Inpatient   Does not apply [place], RPH   0  at 02/16/14 1800    PE: General appearance: alert, cooperative and no distress Neck: mild JVD Lungs: Right sided basilar crakles Heart: irregularly irregular rhythm Extremities: no LEE Pulses: 2+ and symmetric Skin: warm and dry Neurologic: Grossly normal  Lab Results:   Recent Labs  02/16/14 0755 02/16/14 1825 02/17/14 0103 02/17/14 0707  WBC 12.8*  --  10.6*  --   HGB 7.5* 7.4* 7.3* 8.3*  HCT 23.6* 23.5* 22.3* 25.5*  PLT 258  --  255  --    BMET  Recent Labs  02/16/14 0755 02/17/14 0103  NA 142 137  K 4.0 3.6*  CL 101 99  CO2 31 28  GLUCOSE 106* 115*  BUN 27* 20  CREATININE 1.20 1.03  CALCIUM 8.7 8.6   PT/INR  Recent Labs  02/15/14 1907 02/16/14 0755 02/17/14 0707  LABPROT 16.1* 16.2* 16.7*  INR 1.27 1.28 1.34    Assessment/Plan  Principal Problem:   Appendicitis Active Problems:   Essential hypertension   Atrial fibrillation   Tobacco abuse disorder   Diastolic CHF with preserved left ventricular function, NYHA class 4   COPD (chronic obstructive pulmonary disease)   Leukocytosis   ARF (acute renal failure)   Enteritis   Abscess   Acute perforated appendicitis   Appendicitis with abscess   Abdominal distention   Abdominal abscess  1. Chronic Atrial Fibrillation: rate is controled. BP stable. Tolerating clear liquid diet and taking PO meds. Continue PO Cardizem for rate control. Can convert to IV if PO intake becomes problematic. Continue to hold warfarin until ok to resume per general surgery.   2. Chronic Diastolic CHF: stable. No peripheral edema on exam. No increased dyspnea beyond baseline. Abnormal lung exam more consistent with COPD: recent CT of chest noted stable  emphysema, bilateral lower lobe scarring, and bronchiectasis. However, will continue to monitor volume status.    3. Acute perforated appendicitis w/ abscess: s/p per drain on 02-15-14. Gen. Surgery managing.    LOS: 7 days    Brittainy M.  Ladoris Gene 02/17/2014 11:38 AM  Agree with note written by Ellen Henri  PAC  CV stable. Afib with CVR On PO dilt and coumadin AC. No CP. C/O lower abd pain and has decreased BS . On IV ATBX. No change in Rx. Cleared for surgery if necessary. Will be avail for further questions.  Lorretta Harp 02/17/2014 3:30 PM

## 2014-02-17 NOTE — Plan of Care (Signed)
Problem: Phase III Progression Outcomes Goal: Pain controlled on oral analgesia Outcome: Completed/Met Date Met:  02/17/14     

## 2014-02-17 NOTE — Progress Notes (Signed)
[facility] PROGRESS NOTE  Sartaj Hoskin Gaspari ZRA:[id number] DOB: 1940/04/22 DOA: 02/10/2014 PCP: Tula Nakayama, MD  Assessment/Plan:  1. Perforated appendix with abscess/sepsis; CT: Findings are concerning for ruptured appendicitis with an associated approximately 4.5 x 3.2 cm abscess  - General surgery on board and assisting with management. - Cardiology consulted for surgical clearance, currently no plans for further cardiac testing - Pt is s/p drain placement by IR, plan is to repeat CT scan in 5-7 days to evaluate fluid collection - Discussed wound culture results with pharmacy and they have indicated that we can go ahead and narrow antibiotic coverage to Unasyn - Agree with mobilizing.  - Currently on MIVF's  2. CHF, diastolic HF; echo (5456): LVEF 25%, diastolic dysfunction, pulmonary HTN PASP 74, TR -Compensated currently  3. COPD on home oxygen; no wheezing on exam; cont supplemental oxygen, bronchodilators as needed   4. CT: Bilateral lower lobe bronchiectatic changes and consolidation are suspicious for chronic aspiration pneumonitis -Speech therapy evaluation  5. A. fib on Coumadin; HR is stable; coumadin on hold for procedure. INR currently trending down  6. AKI on CKD II in the setting of sepsis; likely ATN;  -gentle IVF, close monitor I/O, daily weight; urine output   7. Acute anemia - stable with last value at 8.7  Code Status: stable Family Communication: None at bedside Disposition Plan: Pending recommendations from specialist involved   Consultants:  General surgery  IR  Cardiology  Procedures:  pending  Antibiotics:  Vancomycin and zosyn>>>02/17/14  Ampicillin-Sulbactam  HPI/Subjective: Pt has no new complaints. No acute issues reported overnight.  Objective: Filed Vitals:   02/17/14 0459  BP: 97/47  Pulse: 54  Temp: 97.3 F (36.3 C)  Resp:     Intake/Output Summary (Last 24 hours) at 02/17/14 1628 Last data filed at  02/17/14 0900  Gross per 24 hour  Intake 1643.75 ml  Output    715 ml  Net 928.75 ml   Filed Weights   02/15/14 0538 02/16/14 0550 02/17/14 0625  Weight: 67.223 kg (148 lb 3.2 oz) 82.056 kg (180 lb 14.4 oz) 84.233 kg (185 lb 11.2 oz)    Exam:   General:  Pt in nad, alert and awake  Cardiovascular: s1 and s2 present, no mrg  Respiratory: cta bl, no wheezes  Abdomen: soft, pain on palpation , no guarding  Musculoskeletal: no clubbing or cyanosis   Data Reviewed: Basic Metabolic Panel:  Recent Labs Lab 02/12/14 1120 02/13/14 0103 02/14/14 0813 02/16/14 0755 02/17/14 0103  NA 138 140 144 142 137  K 4.1 4.0 4.1 4.0 3.6*  CL 94* 96 100 101 99  CO2 30 28 29 31 28   GLUCOSE 86 94 116* 106* 115*  BUN 37* 42* 56* 27* 20  CREATININE 1.75* 1.69* 1.80* 1.20 1.03  CALCIUM 9.0 9.2 9.3 8.7 8.6   Liver Function Tests:  Recent Labs Lab 02/11/14 0554 02/14/14 0813 02/16/14 0755  AST 15 19 19   ALT 6 7 7   ALKPHOS 98 77 83  BILITOT 1.0 1.2 1.5*  PROT 7.2 6.8 6.2  ALBUMIN 2.6* 2.4* 2.2*   No results for input(s): LIPASE, AMYLASE in the last 168 hours. No results for input(s): AMMONIA in the last 168 hours. CBC:  Recent Labs Lab 02/13/14 0103  02/13/14 2350 02/14/14 0813  02/15/14 1907 02/16/14 0755 02/16/14 1825 02/17/14 0103 02/17/14 0707  WBC 16.0*  --  17.6* 17.2*  --   --  12.8*  --  10.6*  --  HGB 8.4*  < > 8.7* 8.7*  < > 8.1* 7.5* 7.4* 7.3* 8.3*  HCT 26.0*  < > 26.3* 27.3*  < > 24.9* 23.6* 23.5* 22.3* 25.5*  MCV 86.1  --  86.5 84.5  --   --  84.6  --  87.1  --   PLT 257  --  276 257  --   --  258  --  255  --   < > = values in this interval not displayed. Cardiac Enzymes:  Recent Labs Lab 02/12/14 1904 02/13/14 0103 02/13/14 0615  TROPONINI <0.30 <0.30 <0.30   BNP (last 3 results)  Recent Labs  06/16/13 1554 06/17/13 0537 07/09/13 0555  PROBNP 3560.0* 3222.0* 3842.0*   CBG: No results for input(s): GLUCAP in the last 168 hours.  Recent  Results (from the past 240 hour(s))  Blood culture (routine x 2)     Status: None   Collection Time: 02/10/14  1:50 PM  Result Value Ref Range Status   Specimen Description BLOOD RIGHT ANTECUBITAL  Final   Special Requests BOTTLES DRAWN AEROBIC AND ANAEROBIC [facility]  Final   Culture  Setup Time   Final    02/13/2014 23:00 Performed at [facility]    Culture   Final    BACTEROIDES THETAIOTAOMICRON Note: CRITICAL RESULT CALLED TO, READ BACK BY AND VERIFIED WITH: N CAGUIOA AT 2319 02/13/14 S VANHOORNE BETA LACTAMASE NEGATIVE Performed at [facility]    Report Status 02/15/2014 FINAL  Final  Blood culture (routine x 2)     Status: None   Collection Time: 02/10/14  2:10 PM  Result Value Ref Range Status   Specimen Description BLOOD RIGHT ARM  Final   Special Requests   Final    BOTTLES DRAWN AEROBIC AND ANAEROBIC AEB=8CC ANA=6CC   Culture NO GROWTH 5 DAYS  Final   Report Status 02/15/2014 FINAL  Final     Studies: No results found.  Scheduled Meds: . sodium chloride   Intravenous Once  . allopurinol  300 mg Oral Daily  . ampicillin-sulbactam (UNASYN) IV  3 g Intravenous Q6H  . antiseptic oral rinse  7 mL Mouth Rinse BID  . brimonidine  1 drop Both Eyes Q12H   And  . timolol  1 drop Both Eyes Q12H  . budesonide-formoterol  2 puff Inhalation BID  . diltiazem  180 mg Oral Daily  . heparin subcutaneous  5,000 Units Subcutaneous 3 times per day  . latanoprost  1 drop Both Eyes QHS  . montelukast  10 mg Oral QHS  . pantoprazole (PROTONIX) IV  40 mg Intravenous Q12H  . sodium chloride  3 mL Intravenous Q12H  . tiotropium  18 mcg Inhalation Daily  . warfarin  6 mg Oral ONCE-1800  . Warfarin - Pharmacist Dosing Inpatient   Does not apply q1800   Continuous Infusions: . dextrose 5 % and 0.45 % NaCl with KCl 20 mEq/L 75 mL/hr at 02/17/14 1347     Time spent: > 35 minutes    [name]  [facility] Pager [phone]  If 7PM-7AM, please contact  night-coverage at [email or web address], password [facility] 02/17/2014, 4:28 PM  LOS: 7 days

## 2014-02-17 NOTE — Progress Notes (Addendum)
ANTICOAGULATION/ANTIBIOTIC CONSULT NOTE  Pharmacy Consult for Coumadin and Unasyn Indication: atrial fibrillation and Bacteroides thetaiotaomicron bacteremia  Allergies  Allergen Reactions  . Aspirin Other (See Comments)    On coumadin    Patient Measurements: Height: 5\' 6"  (167.6 cm) Weight: 185 lb 11.2 oz (84.233 kg) IBW/kg (Calculated) : 63.8  Vital Signs: Temp: 97.3 F (36.3 C) (12/08 0459) Temp Source: Oral (12/08 0459) BP: 97/47 mmHg (12/08 0459) Pulse Rate: 54 (12/08 0459)  Labs:  Recent Labs  02/15/14 1907 02/16/14 0755 02/16/14 1825 02/17/14 0103 02/17/14 0707  HGB 8.1* 7.5* 7.4* 7.3* 8.3*  HCT 24.9* 23.6* 23.5* 22.3* 25.5*  PLT  --  258  --  255  --   LABPROT 16.1* 16.2*  --   --  16.7*  INR 1.27 1.28  --   --  1.34  CREATININE  --  1.20  --  1.03  --     Estimated Creatinine Clearance: 64.1 mL/min (by C-G formula based on Cr of 1.03).  Assessment: AC: Coumadin PTA for afib. Coumadin reversed 12/3 with vit K 5mg  SQ for perc drain placement and held 12/3->12/6 in case further surgery needed. Restarted 12/7. INR slight trend up after first dose yesterday. Hgb 8.3 - will watch. Plt stable. Pt also on SQ heparin for VTE prophylaxis. Home dose 4mg  daily except for 6mg  on Mon and Thur (admit INR 2.51).   ID: Antibiotics to be narrowed to Unasyn (total abx Day 8) for perforated appendix with abscess with 1/2 blood cx growing bacteroides. WBC down to 10.6. Afeb.     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 Coumadin 6mg  again today 2. Daily INR 3. Will d/c SQ heparin when INR >/= 2 4. Unasyn 3gm IV q6h 5. Will f/u renal function, pt's clinical condition  Sherlon Handing, PharmD, BCPS Clinical pharmacist, pager [phone] 02/17/2014,10:39 AM

## 2014-02-17 NOTE — Progress Notes (Signed)
Patient ID: Theodore [name], male   DOB: [birth year], 73 y.o.   MRN: [id number]    Subjective: Pt feels ok today.  Tolerating his clears and having some BMs, but much more distended today.  Objective: Vital signs in last 24 hours: Temp:  [97.3 F (36.3 C)-97.8 F (36.6 C)] 97.3 F (36.3 C) (12/08 0459) Pulse Rate:  [52-93] 54 (12/08 0459) Resp:  [18] 18 (12/07 2143) BP: (97-118)/(47-61) 97/47 mmHg (12/08 0459) SpO2:  [100 %] 100 % (12/08 0459) Weight:  [185 lb 11.2 oz (84.233 kg)] 185 lb 11.2 oz (84.233 kg) (12/08 0625) Last BM Date: 02/14/14  Intake/Output from previous day: 12/07 0701 - 12/08 0700 In: 2848.5 [P.O.:1056; I.V.:1427.5; IV Piggyback:350] Out: 725 [Urine:700; Drains:25] Intake/Output this shift:    PE: Abd: soft, but much more distended than yesterday, hypoactive BS, drain with only minimal serous output.  Lab Results:   Recent Labs  02/16/14 0755  02/17/14 0103 02/17/14 0707  WBC 12.8*  --  10.6*  --   HGB 7.5*  < > 7.3* 8.3*  HCT 23.6*  < > 22.3* 25.5*  PLT 258  --  255  --   < > = values in this interval not displayed. BMET  Recent Labs  02/16/14 0755 02/17/14 0103  NA 142 137  K 4.0 3.6*  CL 101 99  CO2 31 28  GLUCOSE 106* 115*  BUN 27* 20  CREATININE 1.20 1.03  CALCIUM 8.7 8.6   PT/INR  Recent Labs  02/16/14 0755 02/17/14 0707  LABPROT 16.2* 16.7*  INR 1.28 1.34   CMP     Component Value Date/Time   NA 137 02/17/2014 0103   K 3.6* 02/17/2014 0103   CL 99 02/17/2014 0103   CO2 28 02/17/2014 0103   GLUCOSE 115* 02/17/2014 0103   BUN 20 02/17/2014 0103   CREATININE 1.03 02/17/2014 0103   CREATININE 1.07 09/29/2013 1132   CALCIUM 8.6 02/17/2014 0103   PROT 6.2 02/16/2014 0755   ALBUMIN 2.2* 02/16/2014 0755   AST 19 02/16/2014 0755   ALT 7 02/16/2014 0755   ALKPHOS 83 02/16/2014 0755   BILITOT 1.5* 02/16/2014 0755   GFRNONAA 69* 02/17/2014 0103   GFRNONAA 68 09/29/2013 1132   GFRAA 81* 02/17/2014 0103   GFRAA 79  09/29/2013 1132   Lipase     Component Value Date/Time   LIPASE 9* 02/10/2014 1106       Studies/Results: Ct Image Guided Drainage By Percutaneous Catheter  02/15/2014   CLINICAL DATA:  Right lower quadrant abscess  EXAM: CT IMAGE GUIDED DRAINAGE BY PERCUTANEOUS CATHETER  FLUOROSCOPY TIME:  Min  MEDICATIONS AND MEDICAL HISTORY: Versed 0.5 mg, Fentanyl 25 mcg.  Additional Medications: None.  ANESTHESIA/SEDATION: Moderate sedation time: 15 minutes  CONTRAST:  Min  PROCEDURE: The procedure, risks, benefits, and alternatives were explained to the patient. Questions regarding the procedure were encouraged and answered. The patient understands and consents to the procedure.  The right lower quadrant was prepped with Betadine in a sterile fashion, and a sterile drape was applied covering the operative field. A sterile gown and sterile gloves were used for the procedure.  Under CT guidance, an 18 gauge to inserted into the right lower quadrant abscess and removed over an Amplatz. A 12 French dilator followed by a 12 [place] drain were inserted. It was looped and string fixed in the abscess. 15 cc pus was aspirated.  FINDINGS: Images document 38 French drain placement into a right lower quadrant abscess.  COMPLICATIONS: None  IMPRESSION: Successful right lower quadrant abscess drain.   Electronically Signed   By: Theodore [name] M.D.   On: 02/15/2014 11:45    Anti-infectives: Anti-infectives    Start     Dose/Rate Route Frequency Ordered Stop   02/12/14 2000  piperacillin-tazobactam (ZOSYN) IVPB 3.375 g     3.375 g12.5 mL/hr over 240 Minutes Intravenous Every 8 hours 02/12/14 1753     02/12/14 2000  vancomycin (VANCOCIN) IVPB 1000 mg/200 mL premix     1,000 mg200 mL/hr over 60 Minutes Intravenous Every 24 hours 02/12/14 1753     02/11/14 1700  vancomycin (VANCOCIN) IVPB 1000 mg/200 mL premix  Status:  Discontinued     1,000 mg200 mL/hr over 60 Minutes Intravenous Every 24 hours 02/10/14 1607 02/12/14 1753    02/10/14 2200  piperacillin-tazobactam (ZOSYN) IVPB 3.375 g  Status:  Discontinued     3.375 g12.5 mL/hr over 240 Minutes Intravenous Every 8 hours 02/10/14 1602 02/12/14 1753   02/10/14 1700  vancomycin (VANCOCIN) 1,250 mg in sodium chloride 0.9 % 250 mL IVPB     1,250 mg166.7 mL/hr over 90 Minutes Intravenous  Once 02/10/14 1605 02/10/14 1934   02/10/14 1345  piperacillin-tazobactam (ZOSYN) IVPB 3.375 g     3.375 g100 mL/hr over 30 Minutes Intravenous  Once 02/10/14 1344 02/10/14 1452       Assessment/Plan  1. Acute perforated appendicitis with abscess, s/p perc drain on 02-15-14 2. COPD 3. Chronic A fib 4. Anemia 5. Ileus, worsening again  Plan: 1. Will decrease him back to sips and chips.  Try to avoid NGT again, but much more distended today. 2. Cont drain for perforated appendicitis abscess.  He will need a repeat CT scan around Friday to evaluate this fluid collection 3. Cont abx therapy. Zosyn D 5.  He has had at least 9 days total of some type of abx since admission to [facility].  LOS: 7 days    Theodore [name] E 02/17/2014, 9:10 AM Pager: [phone]

## 2014-02-17 NOTE — Progress Notes (Signed)
0334 Patient had 8 beats of vtach. Patient alert and oriented denies pain or sob. Vital signd b/p 98/60,p52 irregular, O2 sats 100% on 2l/m nasal cannula. [place] notified. Will continue to monitor.

## 2014-02-17 NOTE — Progress Notes (Signed)
Physical Therapy Treatment Patient Details Name: Theodore [name] MRN: [id number] DOB: [birth year] Today's Date: 02/17/2014    History of Present Illness Patient is a 73 y/o male presents with acute RLQ pain due to acute perforated appendicitis with abscess, s/p perc drain on 02-15-14. PMH of A-fib, diastolic heart failure, pulmonary HTN, COPD, anxiety, HTN and anemia.    PT Comments    Pt continues to demonstrate decreased functional endurance with mobility and requires several rest breaks to perform mobility and transfers. Overall steady A to close S level without using AD but anticipate pt may benefit from use of cane for overall balance with mobility and increase endurance. Recommend trial with cane in future session with PT to determine if appropriate. Pt still plan to go home with assist and supervision provided by pt's son.   Follow Up Recommendations  Home health PT;Supervision/Assistance - 24 hour     Equipment Recommendations  Other (comment) (Likely to benefit from [facility])    Recommendations for Other Services       Precautions / Restrictions Precautions Precautions: Fall Precaution Comments: drain Restrictions Weight Bearing Restrictions: No    Mobility  Bed Mobility Overal bed mobility: Needs Assistance Bed Mobility: Supine to Sit     Supine to sit: Supervision;HOB elevated     General bed mobility comments: Supervision for bed mobility using handrails for support; cues for safety with management of IV, drain, and O2 tubing  Transfers Overall transfer level: Needs assistance Equipment used: None Transfers: Sit to/from Stand Sit to Stand: Supervision;Min guard         General transfer comment: Steady A initially but progressed to performing sit to stands with overall S and no LOB. Pt performed functional bed to [facility] transfers for toileting with steady A/close S to manage lines and tubing during transfer.  Ambulation/Gait Ambulation/Gait assistance: Min  guard Ambulation Distance (Feet): 50 Feet Assistive device: None (without AD and used rail in hallway) Gait Pattern/deviations: Step-through pattern;Decreased stride length;Narrow base of support     General Gait Details: Pt with unsteadiness noted during gait without AD and limited due to activity tolerance though minimal use of rail needed during gait trials. Pt stating he felt more comfortable holding onto something.   Stairs            Wheelchair Mobility    Modified Rankin (Stroke Patients Only)       Balance   Sitting-balance support: Feet supported;No upper extremity supported Sitting balance-Leahy Scale: Good     Standing balance support: During functional activity Standing balance-Leahy Scale: Fair Standing balance comment: Steady A for functional balance tasks during toileting, donning and doffing extra gown without UE suport                    Cognition Arousal/Alertness: Awake/alert Behavior During Therapy: WFL for tasks assessed/performed Overall Cognitive Status: Within Functional Limits for tasks assessed                      Exercises General Exercises - Lower Extremity Ankle Circles/Pumps: Strengthening;AROM;10 reps;Seated Long Arc Quad: Strengthening;Both;10 reps;Seated    General Comments        Pertinent Vitals/Pain Pain Score: 4  Pain Location: abdomen Pain Descriptors / Indicators: Sore;Other (Comment) Pain Intervention(s): Monitored during session;Repositioned    Home Living                      Prior Function  PT Goals (current goals can now be found in the care plan section) Acute Rehab PT Goals Patient Stated Goal: to get home PT Goal Formulation: With patient Time For Goal Achievement: 03/02/14 Potential to Achieve Goals: Good Progress towards PT goals: Progressing toward goals    Frequency  Min 4X/week    PT Plan Current plan remains appropriate    Co-evaluation              End of Session Equipment Utilized During Treatment: Gait belt;Oxygen Activity Tolerance: Patient limited by fatigue Patient left: in chair;with call bell/phone within reach     Time: 0944-1009 PT Time Calculation (min) (ACUTE ONLY): 25 min  Charges:  $Gait Training: 8-22 mins $Therapeutic Activity: 8-22 mins                    G Codes:      Allayne Gitelman 02/17/2014, 10:19 AM

## 2014-02-18 ENCOUNTER — Inpatient Hospital Stay (HOSPITAL_COMMUNITY): Payer: Medicare Other

## 2014-02-18 LAB — PROTIME-INR
INR: 1.53 — ABNORMAL HIGH (ref 0.00–1.49)
Prothrombin Time: 18.5 seconds — ABNORMAL HIGH (ref 11.6–15.2)

## 2014-02-18 LAB — HEMOGLOBIN AND HEMATOCRIT, BLOOD
HCT: 24.1 % — ABNORMAL LOW (ref 39.0–52.0)
HCT: 26 % — ABNORMAL LOW (ref 39.0–52.0)
HEMOGLOBIN: 7.5 g/dL — AB (ref 13.0–17.0)
HEMOGLOBIN: 8.2 g/dL — AB (ref 13.0–17.0)

## 2014-02-18 MED ORDER — METOPROLOL TARTRATE 1 MG/ML IV SOLN
5.0000 mg | Freq: Four times a day (QID) | INTRAVENOUS | Status: DC
  Administered 2014-02-18 – 2014-02-25 (×26): 5 mg via INTRAVENOUS
  Filled 2014-02-18 (×32): qty 5

## 2014-02-18 MED ORDER — HEPARIN (PORCINE) IN NACL 100-0.45 UNIT/ML-% IJ SOLN
1250.0000 [IU]/h | INTRAMUSCULAR | Status: DC
  Administered 2014-02-18 – 2014-02-20 (×3): 1150 [IU]/h via INTRAVENOUS
  Administered 2014-02-21 – 2014-02-25 (×4): 1250 [IU]/h via INTRAVENOUS
  Filled 2014-02-18 (×9): qty 250

## 2014-02-18 MED ORDER — NICOTINE 14 MG/24HR TD PT24: 14.0000 mg | MEDICATED_PATCH | Freq: Every day | TRANSDERMAL | Status: DC

## 2014-02-18 MED ORDER — WARFARIN SODIUM 6 MG PO TABS
6.0000 mg | ORAL_TABLET | Freq: Once | ORAL | Status: DC
  Filled 2014-02-18: qty 1

## 2014-02-18 NOTE — Progress Notes (Signed)
[facility] PROGRESS NOTE  Theodore [name] ZJQ:[id number] DOB: 01/07/1941 DOA: 02/10/2014 PCP: Tula Nakayama, MD  Assessment/Plan:  1. Perforated appendix with abscess/sepsis; CT: Findings are concerning for ruptured appendicitis with an associated approximately 4.5 x 3.2 cm abscess  - General surgery on board and assisting with management. - Cardiology consulted for surgical clearance, currently no plans for further cardiac testing - Pt is s/p drain placement by IR, plan is to repeat CT scan in 5-7 days to evaluate fluid collection (most likely this Friday) - Discussed wound culture results with pharmacy and they have indicated that we can go ahead and narrow antibiotic coverage to Unasyn - Agree with mobilizing.  - Currently on MIVF's - Ileus so General surgery would like to continue NG tube and make patient NPO. -   2. CHF, diastolic HF; echo (2409): LVEF 73%, diastolic dysfunction, pulmonary HTN PASP 74, TR -Compensated currently  3. COPD on home oxygen; no wheezing on exam; cont supplemental oxygen, bronchodilators as needed   4. CT: Bilateral lower lobe bronchiectatic changes and consolidation are suspicious for chronic aspiration pneumonitis -Speech therapy evaluation  5. A. fib on Coumadin; HR is stable; coumadin on hold for procedure. INR currently trending down - Coumadin on hold due to NPO status. As such will place on heparin.  6. AKI on CKD II in the setting of sepsis; likely ATN;  -gentle IVF, close monitor I/O, daily weight; urine output   7. Acute anemia - stable with last value at 8.7  Code Status: stable Family Communication: None at bedside Disposition Plan: Pending recommendations from specialist involved   Consultants:  General surgery  IR  Cardiology  Procedures:  pending  Antibiotics:  Vancomycin and zosyn>>>02/17/14  Ampicillin-Sulbactam  HPI/Subjective: Pt has no new complaints. Wondering why he need an NG tube again. I  have answered his questions to his satisfaction.  Objective: Filed Vitals:   02/18/14 1417  BP: 122/82  Pulse: 119  Temp: 98.9 F (37.2 C)  Resp: 18    Intake/Output Summary (Last 24 hours) at 02/18/14 1438 Last data filed at 02/18/14 0516  Gross per 24 hour  Intake 1361.25 ml  Output    410 ml  Net 951.25 ml   Filed Weights   02/16/14 0550 02/17/14 0625 02/18/14 0500  Weight: 82.056 kg (180 lb 14.4 oz) 84.233 kg (185 lb 11.2 oz) 83.779 kg (184 lb 11.2 oz)    Exam:   General:  Pt in nad, alert and awake  Cardiovascular: s1 and s2 present, no mrg  Respiratory: cta bl, no wheezes  Abdomen: soft, pain on palpation , no guarding  Musculoskeletal: no clubbing or cyanosis   Data Reviewed: Basic Metabolic Panel:  Recent Labs Lab 02/12/14 1120 02/13/14 0103 02/14/14 0813 02/16/14 0755 02/17/14 0103  NA 138 140 144 142 137  K 4.1 4.0 4.1 4.0 3.6*  CL 94* 96 100 101 99  CO2 30 28 29 31 28   GLUCOSE 86 94 116* 106* 115*  BUN 37* 42* 56* 27* 20  CREATININE 1.75* 1.69* 1.80* 1.20 1.03  CALCIUM 9.0 9.2 9.3 8.7 8.6   Liver Function Tests:  Recent Labs Lab 02/14/14 0813 02/16/14 0755  AST 19 19  ALT 7 7  ALKPHOS 77 83  BILITOT 1.2 1.5*  PROT 6.8 6.2  ALBUMIN 2.4* 2.2*   No results for input(s): LIPASE, AMYLASE in the last 168 hours. No results for input(s): AMMONIA in the last 168 hours. CBC:  Recent Labs Lab 02/13/14 0103  02/13/14 2350 02/14/14 0813  02/16/14 0755 02/16/14 1825 02/17/14 0103 02/17/14 0707 02/17/14 1901 02/18/14 0618  WBC 16.0*  --  17.6* 17.2*  --  12.8*  --  10.6*  --   --   --   HGB 8.4*  < > 8.7* 8.7*  < > 7.5* 7.4* 7.3* 8.3* 7.8* 7.5*  HCT 26.0*  < > 26.3* 27.3*  < > 23.6* 23.5* 22.3* 25.5* 24.8* 24.1*  MCV 86.1  --  86.5 84.5  --  84.6  --  87.1  --   --   --   PLT 257  --  276 257  --  258  --  255  --   --   --   < > = values in this interval not displayed. Cardiac Enzymes:  Recent Labs Lab 02/12/14 1904  02/13/14 0103 02/13/14 0615  TROPONINI <0.30 <0.30 <0.30   BNP (last 3 results)  Recent Labs  06/16/13 1554 06/17/13 0537 07/09/13 0555  PROBNP 3560.0* 3222.0* 3842.0*   CBG: No results for input(s): GLUCAP in the last 168 hours.  Recent Results (from the past 240 hour(s))  Blood culture (routine x 2)     Status: None   Collection Time: 02/10/14  1:50 PM  Result Value Ref Range Status   Specimen Description BLOOD RIGHT ANTECUBITAL  Final   Special Requests BOTTLES DRAWN AEROBIC AND ANAEROBIC [name]  Final   Culture  Setup Time   Final    02/13/2014 23:00 Performed at [facility]    Culture   Final    BACTEROIDES THETAIOTAOMICRON Note: CRITICAL RESULT CALLED TO, READ BACK BY AND VERIFIED WITH: N CAGUIOA AT 2319 02/13/14 S VANHOORNE BETA LACTAMASE NEGATIVE Performed at [facility]    Report Status 02/15/2014 FINAL  Final  Blood culture (routine x 2)     Status: None   Collection Time: 02/10/14  2:10 PM  Result Value Ref Range Status   Specimen Description BLOOD RIGHT ARM  Final   Special Requests   Final    BOTTLES DRAWN AEROBIC AND ANAEROBIC AEB=8CC ANA=6CC   Culture NO GROWTH 5 DAYS  Final   Report Status 02/15/2014 FINAL  Final     Studies: Dg Abd Portable 1v  02/18/2014   CLINICAL DATA:  Acute lower abdominal pain and abdominal distention. Right lower quadrant percutaneous abscess drainage 3 days ago, likely secondary to appendiceal rupture.  EXAM: PORTABLE ABDOMEN - 1 VIEW  COMPARISON:  CT abdomen at time of abscess drainage 02/15/2014. Unenhanced CT abdomen and pelvis 02/14/2014, 02/10/2014. One-view abdomen x-ray 02/12/2014, 02/11/2014.  FINDINGS: Abscess drainage catheter in the right lower quadrant. Persistent moderate to marked gaseous distention of a dilated loop of jejunum in the left upper quadrant. Remainder of the small bowel of normal caliber. Moderate gaseous distention of the stomach with interval nasogastric tube removal. Oral contrast  material from the CT 4 days ago remains in the normal caliber colon. No suggestion of free air on the supine image. Airspace consolidation with air bronchograms medially in the visualized lower lobes, unchanged  IMPRESSION: 1. Stable moderate to marked dilation of a loop of jejunum in the left upper quadrant. As there was no transition point on the prior CT, this likely reflects a localized ileus or atonic segment. 2. Moderate gaseous distention of the stomach with interval NG tube removal. 3. Stable atelectasis versus pneumonia in the medial lower lobes bilaterally.   Electronically Signed   By: Sherran Needs.D.  On: 02/18/2014 10:44    Scheduled Meds: . sodium chloride   Intravenous Once  . allopurinol  300 mg Oral Daily  . ampicillin-sulbactam (UNASYN) IV  3 g Intravenous Q6H  . antiseptic oral rinse  7 mL Mouth Rinse BID  . brimonidine  1 drop Both Eyes Q12H   And  . timolol  1 drop Both Eyes Q12H  . budesonide-formoterol  2 puff Inhalation BID  . latanoprost  1 drop Both Eyes QHS  . metoprolol  5 mg Intravenous 4 times per day  . sodium chloride  3 mL Intravenous Q12H  . tiotropium  18 mcg Inhalation Daily   Continuous Infusions: . dextrose 5 % and 0.45 % NaCl with KCl 20 mEq/L 75 mL/hr at 02/18/14 0517  . heparin       Time spent: > 35 minutes    [name]  [facility] Pager [phone]  If 7PM-7AM, please contact night-coverage at [email or web address], password [facility] 02/18/2014, 2:38 PM  LOS: 8 days

## 2014-02-18 NOTE — Progress Notes (Signed)
Patient ID: Theodore [name], male   DOB: [birth year], 73 y.o.   MRN: [id number]    Subjective: Still feels ok, but distended.  No nausea.  Last BM was yesterday  Objective: Vital signs in last 24 hours: Temp:  [98.2 F (36.8 C)-98.6 F (37 C)] 98.6 F (37 C) (12/09 0514) Pulse Rate:  [69-97] 69 (12/09 0514) Resp:  [16-20] 20 (12/09 0514) BP: (112-132)/(60-83) 132/83 mmHg (12/09 0514) SpO2:  [93 %-98 %] 98 % (12/09 0514) Weight:  [184 lb 11.2 oz (83.779 kg)] 184 lb 11.2 oz (83.779 kg) (12/09 0500) Last BM Date: 02/14/14  Intake/Output from previous day: 12/08 0701 - 12/09 0700 In: 2481.3 [P.O.:360; I.V.:1816.3; IV Piggyback:300] Out: 420 [Urine:400; Drains:20] Intake/Output this shift:    PE: Abd: soft, yet still distended, hypoactive BS, minimally tender, JP drain with minimal serous output  Lab Results:   Recent Labs  02/16/14 0755  02/17/14 0103  02/17/14 1901 02/18/14 0618  WBC 12.8*  --  10.6*  --   --   --   HGB 7.5*  < > 7.3*  < > 7.8* 7.5*  HCT 23.6*  < > 22.3*  < > 24.8* 24.1*  PLT 258  --  255  --   --   --   < > = values in this interval not displayed. BMET  Recent Labs  02/16/14 0755 02/17/14 0103  NA 142 137  K 4.0 3.6*  CL 101 99  CO2 31 28  GLUCOSE 106* 115*  BUN 27* 20  CREATININE 1.20 1.03  CALCIUM 8.7 8.6   PT/INR  Recent Labs  02/17/14 0707 02/18/14 0618  LABPROT 16.7* 18.5*  INR 1.34 1.53*   CMP     Component Value Date/Time   NA 137 02/17/2014 0103   K 3.6* 02/17/2014 0103   CL 99 02/17/2014 0103   CO2 28 02/17/2014 0103   GLUCOSE 115* 02/17/2014 0103   BUN 20 02/17/2014 0103   CREATININE 1.03 02/17/2014 0103   CREATININE 1.07 09/29/2013 1132   CALCIUM 8.6 02/17/2014 0103   PROT 6.2 02/16/2014 0755   ALBUMIN 2.2* 02/16/2014 0755   AST 19 02/16/2014 0755   ALT 7 02/16/2014 0755   ALKPHOS 83 02/16/2014 0755   BILITOT 1.5* 02/16/2014 0755   GFRNONAA 69* 02/17/2014 0103   GFRNONAA 68 09/29/2013 1132   GFRAA 81*  02/17/2014 0103   GFRAA 79 09/29/2013 1132   Lipase     Component Value Date/Time   LIPASE 9* 02/10/2014 1106       Studies/Results: No results found.  Anti-infectives: Anti-infectives    Start     Dose/Rate Route Frequency Ordered Stop   02/17/14 1200  Ampicillin-Sulbactam (UNASYN) 3 g in sodium chloride 0.9 % 100 mL IVPB     3 g100 mL/hr over 60 Minutes Intravenous Every 6 hours 02/17/14 1110     02/12/14 2000  piperacillin-tazobactam (ZOSYN) IVPB 3.375 g  Status:  Discontinued     3.375 g12.5 mL/hr over 240 Minutes Intravenous Every 8 hours 02/12/14 1753 02/17/14 1042   02/12/14 2000  vancomycin (VANCOCIN) IVPB 1000 mg/200 mL premix  Status:  Discontinued     1,000 mg200 mL/hr over 60 Minutes Intravenous Every 24 hours 02/12/14 1753 02/17/14 1042   02/11/14 1700  vancomycin (VANCOCIN) IVPB 1000 mg/200 mL premix  Status:  Discontinued     1,000 mg200 mL/hr over 60 Minutes Intravenous Every 24 hours 02/10/14 1607 02/12/14 1753   02/10/14 2200  piperacillin-tazobactam (ZOSYN) IVPB  3.375 g  Status:  Discontinued     3.375 g12.5 mL/hr over 240 Minutes Intravenous Every 8 hours 02/10/14 1602 02/12/14 1753   02/10/14 1700  vancomycin (VANCOCIN) 1,250 mg in sodium chloride 0.9 % 250 mL IVPB     1,250 mg166.7 mL/hr over 90 Minutes Intravenous  Once 02/10/14 1605 02/10/14 1934   02/10/14 1345  piperacillin-tazobactam (ZOSYN) IVPB 3.375 g     3.375 g100 mL/hr over 30 Minutes Intravenous  Once 02/10/14 1344 02/10/14 1452       Assessment/Plan  1. Acute perforated appendicitis with abscess, s/p perc drain on 02-15-14 2. COPD 3. Chronic A fib 4. Anemia 5. Ileus, worsening again  Plan: 1. Repeat abdominal film today 2. Cont NPO x ice 3. Repeat CT scan on Friday likely to assess drain and abscess.   LOS: 8 days    Carolann Brazell E 02/18/2014, 9:17 AM Pager: [phone]

## 2014-02-18 NOTE — Progress Notes (Addendum)
[place] for Coumadin ->Heparin Indication: atrial fibrillation   Allergies  Allergen Reactions  . Aspirin Other (See Comments)    On coumadin    Patient Measurements: Height: 5\' 6"  (167.6 cm) Weight: 184 lb 11.2 oz (83.779 kg) IBW/kg (Calculated) : 63.8  Heparin dosing weight:83 kg  Vital Signs: Temp: 98.6 F (37 C) (12/09 0514) Temp Source: Oral (12/09 0514) BP: 132/83 mmHg (12/09 0514) Pulse Rate: 69 (12/09 0514)  Labs:  Recent Labs  02/16/14 0755  02/17/14 0103 02/17/14 0707 02/17/14 1901 02/18/14 0618  HGB 7.5*  < > 7.3* 8.3* 7.8* 7.5*  HCT 23.6*  < > 22.3* 25.5* 24.8* 24.1*  PLT 258  --  255  --   --   --   LABPROT 16.2*  --   --  16.7*  --  18.5*  INR 1.28  --   --  1.34  --  1.53*  CREATININE 1.20  --  1.03  --   --   --   < > = values in this interval not displayed.  Estimated Creatinine Clearance: 63.9 mL/min (by C-G formula based on Cr of 1.03).  Assessment: AC: Coumadin PTA for afib. Coumadin reversed 12/3 with vit K 5mg  SQ for perc drain placement and held 12/3->12/6 in case further surgery needed. Restarted 12/7. INR trending up after restarting. Hgb 7.5 - will watch. Plt stable. Pt also on SQ heparin for VTE prophylaxis. Home dose 4mg  daily except for 6mg  on Mon and Thur (admit INR 2.51).   Goal of Therapy:  INR 2-3 Monitor platelets by anticoagulation protocol: Yes   Plan:  1. Coumadin 6mg  again today 2. Daily INR 3. Will d/c SQ heparin when INR >/= 2  Sherlon Handing, PharmD, BCPS Clinical pharmacist, pager [phone] 02/18/2014,10:17 AM   Addendum 1430 Pt requiring NGT placement for ileus. Holding coumadin and changing SQ heparin to full dose heparin per pharmacy for afib bridging. Pt may still require procedures.  Plan: 1) Heparin gtt at 1150 units/hr. No bolus. 2) Heparin level in 8 hours 3) Will f/u daily heparin level and CBC  Sherlon Handing, PharmD, BCPS Clinical pharmacist, pager  [phone] 02/18/2014 2:35 PM

## 2014-02-18 NOTE — Progress Notes (Signed)
Patient is active with [facility] for the last 2 years for CHF, COPD disease management. Came to bedside to speak with him. He reports his plan is to return home at discharge with assist of his sons. Of note, [facility] RNCM reports that one son is of little assist and the other son is helpful but works during the day. Mr Capobianco was managing well at home prior to admission. However, discussed with patient at bedside the possibility of short term SNF due to length of stay in hospital and deconditioning. Patient adamant that he wants to return home. Will speak with inpatient RNCM to discuss and noted that therapy recommending home with home health services. Will continue to follow.  [facility] [phone]

## 2014-02-18 NOTE — Plan of Care (Signed)
Problem: Phase II Progression Outcomes Goal: Progress activity as tolerated unless otherwise ordered Outcome: Completed/Met Date Met:  02/18/14 Goal: Vital signs remain stable Outcome: Completed/Met Date Met:  02/18/14 Goal: Obtain order to discontinue catheter if appropriate Outcome: Not Applicable Date Met:  33/61/22  Problem: Phase III Progression Outcomes Goal: Foley discontinued Outcome: Not Applicable Date Met:  44/97/53

## 2014-02-18 NOTE — Progress Notes (Addendum)
NG tube 18 Fr. Inserted into pt left nare to suction as ordered. Pt tolerated well. Pt PO meds held and not given; PA Claiborne Billings and Dr. Wendee Beavers aware, Dr. Wendee Beavers said he will look at pt medication and switch to a different route. Pt sitting up in chair watching TV with call light within reach. Francis Gaines Mical Brun RN.

## 2014-02-18 NOTE — Progress Notes (Signed)
Referring Physician(s): CCS  Subjective:  RLQ abscess drain placed 12/6 Up in chair Better afeb Output minimal   Allergies: Aspirin  Medications: Prior to Admission medications   Medication Sig Start Date End Date Taking? Authorizing Provider  albuterol (PROAIR HFA) 108 (90 BASE) MCG/ACT inhaler Inhale 2 puffs into the lungs every 6 (six) hours as needed for wheezing or shortness of breath. 05/23/13  Yes Fayrene Helper, MD  albuterol (PROVENTIL) (2.5 MG/3ML) 0.083% nebulizer solution Take 2.5 mg by nebulization every 8 (eight) hours as needed for wheezing or shortness of breath.   Yes Historical Provider, MD  allopurinol (ZYLOPRIM) 300 MG tablet Take 1 tablet (300 mg total) by mouth daily. 05/23/13  Yes Fayrene Helper, MD  ALPRAZolam Duanne Moron) 0.25 MG tablet Take 0.25 mg by mouth daily as needed for anxiety.   Yes Historical Provider, MD  brimonidine-timolol (COMBIGAN) 0.2-0.5 % ophthalmic solution Place 1 drop into both eyes every 12 (twelve) hours.   Yes Historical Provider, MD  budesonide-formoterol (SYMBICORT) 160-4.5 MCG/ACT inhaler Inhale 2 puffs into the lungs 2 (two) times daily. 05/23/13  Yes Fayrene Helper, MD  diltiazem (CARDIZEM CD) 180 MG 24 hr capsule Take 1 capsule (180 mg total) by mouth daily. 09/22/13  Yes Herminio Commons, MD  mirtazapine (REMERON) 15 MG tablet Take 1 tablet (15 mg total) by mouth at bedtime. 12/30/13  Yes Fayrene Helper, MD  montelukast (SINGULAIR) 10 MG tablet Take 10 mg by mouth at bedtime.   Yes Historical Provider, MD  potassium chloride (K-DUR) 10 MEQ tablet Take 10 mEq by mouth 2 (two) times daily.  06/13/13  Yes Tanna Furry, MD  tiotropium (SPIRIVA) 18 MCG inhalation capsule Place 18 mcg into inhaler and inhale daily.   Yes Historical Provider, MD  torsemide (DEMADEX) 20 MG tablet Take 2 tablets (40 mg total) by mouth daily. 10/02/13  Yes Herminio Commons, MD  traMADol (ULTRAM) 50 MG tablet Take 50 mg by mouth daily as needed  (pain).   Yes Historical Provider, MD  travoprost, benzalkonium, (TRAVATAN) 0.004 % ophthalmic solution Place 1 drop into both eyes at bedtime.   Yes Historical Provider, MD  traZODone (DESYREL) 50 MG tablet Take 0.5-1 tablets (25-50 mg total) by mouth at bedtime as needed for sleep. 03/13/14  Yes Fayrene Helper, MD  warfarin (COUMADIN) 4 MG tablet Take 1.5 tabs (6 mg) on Monday and Thursday and 1 tab (4mg ) on other days Patient taking differently: Take 4-6 mg by mouth daily. Take 1.5 tabs (6 mg) on Monday and Thursday and 1 tab (4mg ) on other days 02/09/14  Yes Herminio Commons, MD  albuterol (PROVENTIL) (2.5 MG/3ML) 0.083% nebulizer solution INHALE THE CONTENTS OF 1 VIAL USING THE NEBULIZER EVERY 6 HOURS AS NEEDED Patient not taking: Reported on 02/10/2014 01/13/14   Fayrene Helper, MD  traMADol (ULTRAM) 50 MG tablet TAKE 1 TABLET BY MOUTH EVERY DAY AS NEEDED Patient not taking: Reported on 02/10/2014 11/19/13   Fayrene Helper, MD    Review of Systems  Vital Signs: BP 132/83 mmHg  Pulse 69  Temp(Src) 98.6 F (37 C) (Oral)  Resp 20  Ht 5\' 6"  (1.676 m)  Wt 83.779 kg (184 lb 11.2 oz)  BMI 29.83 kg/m2  SpO2 98%  Physical Exam  Abdominal:  Site of drain NT; no bleeding Clean and dry Output serous: 20 cc yesterday Minimal in JP afeb  Nursing note and vitals reviewed.   Imaging: Dg Abd Portable 1v  02/18/2014   CLINICAL DATA:  Acute lower abdominal pain and abdominal distention. Right lower quadrant percutaneous abscess drainage 3 days ago, likely secondary to appendiceal rupture.  EXAM: PORTABLE ABDOMEN - 1 VIEW  COMPARISON:  CT abdomen at time of abscess drainage 02/15/2014. Unenhanced CT abdomen and pelvis 02/14/2014, 02/10/2014. One-view abdomen x-ray 02/12/2014, 02/11/2014.  FINDINGS: Abscess drainage catheter in the right lower quadrant. Persistent moderate to marked gaseous distention of a dilated loop of jejunum in the left upper quadrant. Remainder of the small bowel  of normal caliber. Moderate gaseous distention of the stomach with interval nasogastric tube removal. Oral contrast material from the CT 4 days ago remains in the normal caliber colon. No suggestion of free air on the supine image. Airspace consolidation with air bronchograms medially in the visualized lower lobes, unchanged  IMPRESSION: 1. Stable moderate to marked dilation of a loop of jejunum in the left upper quadrant. As there was no transition point on the prior CT, this likely reflects a localized ileus or atonic segment. 2. Moderate gaseous distention of the stomach with interval NG tube removal. 3. Stable atelectasis versus pneumonia in the medial lower lobes bilaterally.   Electronically Signed   By: Evangeline Dakin M.D.   On: 02/18/2014 10:44   Ct Image Guided Drainage By Percutaneous Catheter  02/15/2014   CLINICAL DATA:  Right lower quadrant abscess  EXAM: CT IMAGE GUIDED DRAINAGE BY PERCUTANEOUS CATHETER  FLUOROSCOPY TIME:  Min  MEDICATIONS AND MEDICAL HISTORY: Versed 0.5 mg, Fentanyl 25 mcg.  Additional Medications: None.  ANESTHESIA/SEDATION: Moderate sedation time: 15 minutes  CONTRAST:  Min  PROCEDURE: The procedure, risks, benefits, and alternatives were explained to the patient. Questions regarding the procedure were encouraged and answered. The patient understands and consents to the procedure.  The right lower quadrant was prepped with Betadine in a sterile fashion, and a sterile drape was applied covering the operative field. A sterile gown and sterile gloves were used for the procedure.  Under CT guidance, an 18 gauge to inserted into the right lower quadrant abscess and removed over an Amplatz. A 12 French dilator followed by a 12 [place] drain were inserted. It was looped and string fixed in the abscess. 15 cc pus was aspirated.  FINDINGS: Images document 39 French drain placement into a right lower quadrant abscess.  COMPLICATIONS: None  IMPRESSION: Successful right lower quadrant  abscess drain.   Electronically Signed   By: Maryclare Bean M.D.   On: 02/15/2014 11:45    Labs:  CBC:  Recent Labs  02/13/14 2350 02/14/14 0813  02/16/14 0755  02/17/14 0103 02/17/14 0707 02/17/14 1901 02/18/14 0618  WBC 17.6* 17.2*  --  12.8*  --  10.6*  --   --   --   HGB 8.7* 8.7*  < > 7.5*  < > 7.3* 8.3* 7.8* 7.5*  HCT 26.3* 27.3*  < > 23.6*  < > 22.3* 25.5* 24.8* 24.1*  PLT 276 257  --  258  --  255  --   --   --   < > = values in this interval not displayed.  COAGS:  Recent Labs  04/02/13 0725  11/19/13 1448  02/15/14 1907 02/16/14 0755 02/17/14 0707 02/18/14 0618  INR 1.54*  < > 1.25  < > 1.27 1.28 1.34 1.53*  APTT 36  --  42*  --   --   --   --   --   < > = values in this interval not  displayed.  BMP:  Recent Labs  02/13/14 0103 02/14/14 0813 02/16/14 0755 02/17/14 0103  NA 140 144 142 137  K 4.0 4.1 4.0 3.6*  CL 96 100 101 99  CO2 28 29 31 28   GLUCOSE 94 116* 106* 115*  BUN 42* 56* 27* 20  CALCIUM 9.2 9.3 8.7 8.6  CREATININE 1.69* 1.80* 1.20 1.03  GFRNONAA 38* 35* 58* 69*  GFRAA 44* 41* 67* 81*    LIVER FUNCTION TESTS:  Recent Labs  02/10/14 1106 02/11/14 0554 02/14/14 0813 02/16/14 0755  BILITOT 1.3* 1.0 1.2 1.5*  AST 18 15 19 19   ALT 6 6 7 7   ALKPHOS 127* 98 77 83  PROT 8.0 7.2 6.8 6.2  ALBUMIN 3.0* 2.6* 2.4* 2.2*    Assessment and Plan:  RLQ drain intact Output minimal For prob re CT scan 12/11 per CCS note Will follow    I spent a total of 15 minutes face to face in clinical consultation/evaluation, greater than 50% of which was counseling/coordinating care for RLQ abscess drain  Signed: Hulen Mandler A 02/18/2014, 2:10 PM

## 2014-02-18 NOTE — Progress Notes (Signed)
Physical Therapy Treatment Patient Details Name: Xayvier Vallez Peraza MRN: [id number] DOB: 11/25/1940 Today's Date: 02/18/2014    History of Present Illness      PT Comments    Pt progressing well.  Gait distance continues to be limited by fatigue.  Follow Up Recommendations  Home health PT;Supervision/Assistance - 24 hour     Equipment Recommendations  Cane    Recommendations for Other Services       Precautions / Restrictions Precautions Precautions: Fall    Mobility  Bed Mobility                  Transfers   Equipment used: None Transfers: Stand Pivot Transfers Sit to Stand: Min guard Stand pivot transfers: Min guard       General transfer comment: cues for safety  Ambulation/Gait Ambulation/Gait assistance: Min guard Ambulation Distance (Feet): 50 Feet Assistive device: None Gait Pattern/deviations: Step-through pattern;Decreased stride length;Narrow base of support Gait velocity: decreased Gait velocity interpretation: Below normal speed for age/gender General Gait Details: 2 standing rest breaks required during ambulation. Pt used handrail in hallway.   Stairs            Wheelchair Mobility    Modified Rankin (Stroke Patients Only)       Balance                                    Cognition Arousal/Alertness: Awake/alert Behavior During Therapy: WFL for tasks assessed/performed Overall Cognitive Status: Within Functional Limits for tasks assessed                      Exercises      General Comments        Pertinent Vitals/Pain Pain Assessment: 0-10 Pain Score: 3  Pain Location: abdomen Pain Intervention(s): Monitored during session    Home Living                      Prior Function            PT Goals (current goals can now be found in the care plan section) Progress towards PT goals: Progressing toward goals    Frequency  Min 4X/week    PT Plan Current plan remains appropriate     Co-evaluation             End of Session Equipment Utilized During Treatment: Gait belt;Oxygen Activity Tolerance: Patient limited by fatigue Patient left: in chair;with call bell/phone within reach     Time: 1003-1030 PT Time Calculation (min) (ACUTE ONLY): 27 min  Charges:  $Gait Training: 23-37 mins                    G Codes:      Lorriane Shire 02/18/2014, 11:09 AM

## 2014-02-19 LAB — HEMOGLOBIN AND HEMATOCRIT, BLOOD
HEMATOCRIT: 26.6 % — AB (ref 39.0–52.0)
HEMATOCRIT: 27.1 % — AB (ref 39.0–52.0)
HEMOGLOBIN: 8.4 g/dL — AB (ref 13.0–17.0)
Hemoglobin: 8.5 g/dL — ABNORMAL LOW (ref 13.0–17.0)

## 2014-02-19 LAB — PROTIME-INR
INR: 1.66 — ABNORMAL HIGH (ref 0.00–1.49)
Prothrombin Time: 19.7 seconds — ABNORMAL HIGH (ref 11.6–15.2)

## 2014-02-19 LAB — HEPARIN LEVEL (UNFRACTIONATED)
Heparin Unfractionated: 0.48 IU/mL (ref 0.30–0.70)
Heparin Unfractionated: 0.53 IU/mL (ref 0.30–0.70)

## 2014-02-19 MED ORDER — TRAZODONE HCL 50 MG PO TABS
50.0000 mg | ORAL_TABLET | Freq: Every evening | ORAL | Status: DC | PRN
  Administered 2014-02-19 – 2014-02-25 (×7): 50 mg via ORAL
  Filled 2014-02-19 (×7): qty 1

## 2014-02-19 NOTE — Progress Notes (Signed)
Kim for Heparin Indication: atrial fibrillation   Allergies  Allergen Reactions  . Aspirin Other (See Comments)    On coumadin    Patient Measurements: Height: 5\' 6"  (167.6 cm) Weight: 184 lb 11.2 oz (83.779 kg) IBW/kg (Calculated) : 63.8  Heparin dosing weight:83 kg  Vital Signs: Temp: 98.5 F (36.9 C) (12/10 0517) Temp Source: Oral (12/09 2207) BP: 137/73 mmHg (12/10 0517) Pulse Rate: 95 (12/10 0522)  Labs:  Recent Labs  02/17/14 0103 02/17/14 0707 02/17/14 1901 02/18/14 0618 02/18/14 1900 02/19/14 0005 02/19/14 0347 02/19/14 0750  HGB 7.3* 8.3* 7.8* 7.5* 8.2*  --   --   --   HCT 22.3* 25.5* 24.8* 24.1* 26.0*  --   --   --   PLT 255  --   --   --   --   --   --   --   LABPROT  --  16.7*  --  18.5*  --   --   --  19.7*  INR  --  1.34  --  1.53*  --   --   --  1.66*  HEPARINUNFRC  --   --   --   --   --  0.48 0.53  --   CREATININE 1.03  --   --   --   --   --   --   --     Estimated Creatinine Clearance: 63.9 mL/min (by C-G formula based on Cr of 1.03).  Assessment: 73 y.o. Male on Coumadin PTA for afib (Home dose 4mg  daily except for 6mg  on Mon and Thur - admit INR 2.51). Coumadin reversed 12/3 with vit K 5mg  SQ for perc drain placement and held 12/3->12/6 in case further surgery needed. Restarted 12/7 ->12/8. Now being held due to NGT placement for ileus and pt NPO so bridging with heparin. Heparin level therapeutic. INR 1.66 today. Hgb 8.2. Plt stable. No bleeding noted.  Goal of Therapy:  INR 2-3 Monitor platelets by anticoagulation protocol: Yes   Plan:  1) Continue heparin gtt at 1150 units/hr.  2) Will f/u daily heparin level and CBC 3) F/u ability to restart coumadin  Sherlon Handing, PharmD, BCPS Clinical pharmacist, pager [phone] 02/19/2014,8:51 AM

## 2014-02-19 NOTE — Progress Notes (Signed)
[facility] PROGRESS NOTE  Theodore [name] DEY:[id number] DOB: March 25, 1940 DOA: 02/10/2014 PCP: Tula Nakayama, MD  Assessment/Plan:  1. Perforated appendix with abscess/sepsis; CT: Findings are concerning for ruptured appendicitis with an associated approximately 4.5 x 3.2 cm abscess  - General surgery on board and assisting with management. - Cardiology consulted for surgical clearance, currently no plans for further cardiac testing - Pt is s/p drain placement by IR, plan is to repeat CT scan next am - Discussed wound culture results with pharmacy and they have indicated that we can go ahead and narrow antibiotic coverage to Unasyn - Agree with mobilizing.  - Currently on MIVF's - Ileus so General surgery would like to continue NG tube and make patient NPO.  2. CHF, diastolic HF; echo (3149): LVEF 70%, diastolic dysfunction, pulmonary HTN PASP 74, TR -Compensated currently  3. COPD on home oxygen; no wheezing on exam; cont supplemental oxygen, bronchodilators as needed   4. CT: Bilateral lower lobe bronchiectatic changes and consolidation are suspicious for chronic aspiration pneumonitis -Speech therapy evaluation  5. A. fib on Coumadin; HR is stable; coumadin on hold for procedure. INR currently trending down - Coumadin on hold due to NPO status.  - on Heparin  6. AKI on CKD II in the setting of sepsis; likely ATN;  -gentle IVF, close monitor I/O, daily weight; urine output   7. Acute anemia - stable with last value at 8.4  Code Status: stable Family Communication: None at bedside Disposition Plan: Pending recommendations from specialist involved   Consultants:  General surgery  IR  Cardiology  Procedures:  pending  Antibiotics:  Vancomycin and zosyn>>>02/17/14  Ampicillin-Sulbactam  HPI/Subjective: Pt has no new complaints.  Objective: Filed Vitals:   02/19/14 1348  BP: 134/65  Pulse: 98  Temp: 97.9 F (36.6 C)  Resp: 18     Intake/Output Summary (Last 24 hours) at 02/19/14 1621 Last data filed at 02/19/14 1439  Gross per 24 hour  Intake   1595 ml  Output   1202 ml  Net    393 ml   Filed Weights   02/16/14 0550 02/17/14 0625 02/18/14 0500  Weight: 82.056 kg (180 lb 14.4 oz) 84.233 kg (185 lb 11.2 oz) 83.779 kg (184 lb 11.2 oz)    Exam:   General:  Pt in nad, alert and awake  Cardiovascular: s1 and s2 present, no mrg  Respiratory: cta bl, no wheezes  Abdomen: soft, pain on palpation , no guarding  Musculoskeletal: no clubbing or cyanosis   Data Reviewed: Basic Metabolic Panel:  Recent Labs Lab 02/13/14 0103 02/14/14 0813 02/16/14 0755 02/17/14 0103  NA 140 144 142 137  K 4.0 4.1 4.0 3.6*  CL 96 100 101 99  CO2 28 29 31 28   GLUCOSE 94 116* 106* 115*  BUN 42* 56* 27* 20  CREATININE 1.69* 1.80* 1.20 1.03  CALCIUM 9.2 9.3 8.7 8.6   Liver Function Tests:  Recent Labs Lab 02/14/14 0813 02/16/14 0755  AST 19 19  ALT 7 7  ALKPHOS 77 83  BILITOT 1.2 1.5*  PROT 6.8 6.2  ALBUMIN 2.4* 2.2*   No results for input(s): LIPASE, AMYLASE in the last 168 hours. No results for input(s): AMMONIA in the last 168 hours. CBC:  Recent Labs Lab 02/13/14 0103  02/13/14 2350 02/14/14 0813  02/16/14 0755  02/17/14 0103 02/17/14 0707 02/17/14 1901 02/18/14 0618 02/18/14 1900 02/19/14 0750  WBC 16.0*  --  17.6* 17.2*  --  12.8*  --  10.6*  --   --   --   --   --   HGB 8.4*  < > 8.7* 8.7*  < > 7.5*  < > 7.3* 8.3* 7.8* 7.5* 8.2* 8.4*  HCT 26.0*  < > 26.3* 27.3*  < > 23.6*  < > 22.3* 25.5* 24.8* 24.1* 26.0* 26.6*  MCV 86.1  --  86.5 84.5  --  84.6  --  87.1  --   --   --   --   --   PLT 257  --  276 257  --  258  --  255  --   --   --   --   --   < > = values in this interval not displayed. Cardiac Enzymes:  Recent Labs Lab 02/12/14 1904 02/13/14 0103 02/13/14 0615  TROPONINI <0.30 <0.30 <0.30   BNP (last 3 results)  Recent Labs  06/16/13 1554 06/17/13 0537 07/09/13 0555   PROBNP 3560.0* 3222.0* 3842.0*   CBG: No results for input(s): GLUCAP in the last 168 hours.  Recent Results (from the past 240 hour(s))  Blood culture (routine x 2)     Status: None   Collection Time: 02/10/14  1:50 PM  Result Value Ref Range Status   Specimen Description BLOOD RIGHT ANTECUBITAL  Final   Special Requests BOTTLES DRAWN AEROBIC AND ANAEROBIC [facility]  Final   Culture  Setup Time   Final    02/13/2014 23:00 Performed at [facility]    Culture   Final    BACTEROIDES THETAIOTAOMICRON Note: CRITICAL RESULT CALLED TO, READ BACK BY AND VERIFIED WITH: N CAGUIOA AT 2319 02/13/14 S VANHOORNE BETA LACTAMASE NEGATIVE Performed at [facility]    Report Status 02/15/2014 FINAL  Final  Blood culture (routine x 2)     Status: None   Collection Time: 02/10/14  2:10 PM  Result Value Ref Range Status   Specimen Description BLOOD RIGHT ARM  Final   Special Requests   Final    BOTTLES DRAWN AEROBIC AND ANAEROBIC AEB=8CC ANA=6CC   Culture NO GROWTH 5 DAYS  Final   Report Status 02/15/2014 FINAL  Final     Studies: Dg Abd Portable 1v  02/18/2014   CLINICAL DATA:  Acute lower abdominal pain and abdominal distention. Right lower quadrant percutaneous abscess drainage 3 days ago, likely secondary to appendiceal rupture.  EXAM: PORTABLE ABDOMEN - 1 VIEW  COMPARISON:  CT abdomen at time of abscess drainage 02/15/2014. Unenhanced CT abdomen and pelvis 02/14/2014, 02/10/2014. One-view abdomen x-ray 02/12/2014, 02/11/2014.  FINDINGS: Abscess drainage catheter in the right lower quadrant. Persistent moderate to marked gaseous distention of a dilated loop of jejunum in the left upper quadrant. Remainder of the small bowel of normal caliber. Moderate gaseous distention of the stomach with interval nasogastric tube removal. Oral contrast material from the CT 4 days ago remains in the normal caliber colon. No suggestion of free air on the supine image. Airspace consolidation with air  bronchograms medially in the visualized lower lobes, unchanged  IMPRESSION: 1. Stable moderate to marked dilation of a loop of jejunum in the left upper quadrant. As there was no transition point on the prior CT, this likely reflects a localized ileus or atonic segment. 2. Moderate gaseous distention of the stomach with interval NG tube removal. 3. Stable atelectasis versus pneumonia in the medial lower lobes bilaterally.   Electronically Signed   By: Evangeline Dakin M.D.   On: 02/18/2014 10:44  Scheduled Meds: . sodium chloride   Intravenous Once  . allopurinol  300 mg Oral Daily  . ampicillin-sulbactam (UNASYN) IV  3 g Intravenous Q6H  . antiseptic oral rinse  7 mL Mouth Rinse BID  . brimonidine  1 drop Both Eyes Q12H   And  . timolol  1 drop Both Eyes Q12H  . budesonide-formoterol  2 puff Inhalation BID  . latanoprost  1 drop Both Eyes QHS  . metoprolol  5 mg Intravenous 4 times per day  . sodium chloride  3 mL Intravenous Q12H  . tiotropium  18 mcg Inhalation Daily   Continuous Infusions: . dextrose 5 % and 0.45 % NaCl with KCl 20 mEq/L 75 mL/hr at 02/18/14 2250  . heparin 1,150 Units/hr (02/19/14 1438)     Time spent: > 35 minutes    [name]  [facility] Pager [phone]  If 7PM-7AM, please contact night-coverage at [email or web address], password [facility] 02/19/2014, 4:21 PM  LOS: 9 days

## 2014-02-19 NOTE — Progress Notes (Signed)
LOS: 9 days   Subjective: NGT replaced yesterday. Reports no abdominal discomfort. No nausea. Endorses small amounts of flatus but no BM. No specific complaint. Is NPO with ice chips.   Objective: Vital signs in last 24 hours: Temp:  [98.3 F (36.8 C)-98.9 F (37.2 C)] 98.5 F (36.9 C) (12/10 0517) Pulse Rate:  [52-119] 95 (12/10 0522) Resp:  [15-18] 15 (12/10 0517) BP: (122-137)/(68-82) 137/73 mmHg (12/10 0517) SpO2:  [93 %-100 %] 100 % (12/10 0616) Last BM Date: 02/17/14 (as per patient)   Laboratory  CBC  Recent Labs  02/17/14 0103  02/18/14 0618 02/18/14 1900  WBC 10.6*  --   --   --   HGB 7.3*  < > 7.5* 8.2*  HCT 22.3*  < > 24.1* 26.0*  PLT 255  --   --   --   < > = values in this interval not displayed. BMET  Recent Labs  02/17/14 0103  NA 137  K 3.6*  CL 99  CO2 28  GLUCOSE 115*  BUN 20  CREATININE 1.03  CALCIUM 8.6     Physical Exam General appearance: alert, cooperative and no distress Resp: scattered rhonchi Cardio: irregularly irregular rhythm GI: soft, minimally tender in RLQ, distended, hypoactive BS, scant serous output in JP drain. NGT in place draining bilious material. 700cc out over past 24hr.   Assessment/Plan: 1. Acute appendicitis with perforation and intraabdominal abscess. Perc drain placed 02/15/2014. On Unasyn. CT A/P to be repeated tomorrow. Has been ordered.  2. COPD: stable 3.Chronic Afib: currently rate controlled on lopressor. On IV heparin.  4. Ileus: NPO with NGT. 700cc out over last 24 hours. Will continue. CT A/P to be repeated tomorrow, 02/20/2014. Needs to be OOB to chair and ambulate. May clamp NGT for ambulation.  5. Anemia: improving with H/H 8/26 today.   Lahoma Rocker, [facility] Surgery Pager [phone] 02/19/2014

## 2014-02-19 NOTE — Progress Notes (Signed)
ANTICOAGULATION CONSULT NOTE - Follow Up Consult  Pharmacy Consult for heparin Indication: atrial fibrillation   Labs:  Recent Labs  02/16/14 0755  02/17/14 0103 02/17/14 0707 02/17/14 1901 02/18/14 0618 02/18/14 1900 02/19/14 0005  HGB 7.5*  < > 7.3* 8.3* 7.8* 7.5* 8.2*  --   HCT 23.6*  < > 22.3* 25.5* 24.8* 24.1* 26.0*  --   PLT 258  --  255  --   --   --   --   --   LABPROT 16.2*  --   --  16.7*  --  18.5*  --   --   INR 1.28  --   --  1.34  --  1.53*  --   --   HEPARINUNFRC  --   --   --   --   --   --   --  0.48  CREATININE 1.20  --  1.03  --   --   --   --   --   < > = values in this interval not displayed.   Assessment/Plan:  73yo male therapeutic on heparin with initial dosing while Coumadin on hold. Will continue gtt at current rate and confirm stable with am labs.   Wynona Neat, PharmD, BCPS  02/19/2014,12:42 AM

## 2014-02-20 ENCOUNTER — Inpatient Hospital Stay (HOSPITAL_COMMUNITY): Payer: Medicare Other

## 2014-02-20 LAB — MAGNESIUM: Magnesium: 2 mg/dL (ref 1.5–2.5)

## 2014-02-20 LAB — PROTIME-INR
INR: 1.75 — AB (ref 0.00–1.49)
PROTHROMBIN TIME: 20.6 s — AB (ref 11.6–15.2)

## 2014-02-20 LAB — HEPARIN LEVEL (UNFRACTIONATED): Heparin Unfractionated: 0.37 IU/mL (ref 0.30–0.70)

## 2014-02-20 LAB — HEMOGLOBIN AND HEMATOCRIT, BLOOD
HCT: 26.3 % — ABNORMAL LOW (ref 39.0–52.0)
Hemoglobin: 8.1 g/dL — ABNORMAL LOW (ref 13.0–17.0)

## 2014-02-20 MED ORDER — FUROSEMIDE 10 MG/ML IJ SOLN
40.0000 mg | Freq: Every day | INTRAMUSCULAR | Status: DC
  Administered 2014-02-20 – 2014-02-24 (×5): 40 mg via INTRAVENOUS
  Filled 2014-02-20 (×5): qty 4

## 2014-02-20 MED ORDER — IOHEXOL 300 MG/ML  SOLN
100.0000 mL | Freq: Once | INTRAMUSCULAR | Status: AC | PRN
  Administered 2014-02-20: 100 mL via INTRAVENOUS

## 2014-02-20 MED ORDER — IOHEXOL 300 MG/ML  SOLN
25.0000 mL | INTRAMUSCULAR | Status: AC
  Administered 2014-02-20 (×2): 25 mL via ORAL

## 2014-02-20 NOTE — Plan of Care (Signed)
Problem: Phase II Progression Outcomes Goal: Discharge plan established Outcome: Adequate for Discharge Goal: IV changed to normal saline lock Outcome: Progressing

## 2014-02-20 NOTE — Progress Notes (Addendum)
[facility] PROGRESS NOTE  Theodore [name] TFT:[id number] DOB: [birth year] DOA: 02/10/2014 PCP: Tula Nakayama, MD  Assessment/Plan:  1. Perforated appendix with abscess/sepsis; CT: Findings are concerning for ruptured appendicitis with an associated approximately 4.5 x 3.2 cm abscess  - Cardiology consulted for surgical clearance, currently no plans for further cardiac testing - Pt is s/p drain placement by IR, plan is to repeat CT scan next am - Discussed wound culture results with pharmacy and they have indicated that we can go ahead and narrow antibiotic coverage to Unasyn - General surgery on board.  2. CHF, diastolic HF; echo (7062): LVEF 37%, diastolic dysfunction, pulmonary HTN PASP 74, TR -CT reports moderate right heart failure - new problem as such will add lasix and place foley to monitor output  3. COPD on home oxygen supplementation; no wheezing on exam; cont supplemental oxygen, bronchodilators as needed   4. CT: Bilateral lower lobe bronchiectatic changes and consolidation are suspicious for chronic aspiration pneumonitis -Speech therapy evaluation  5. A. fib on Coumadin; HR is stable; coumadin on hold for procedure. INR currently trending down - Coumadin on hold due to NPO status.  - on Heparin  6. AKI on CKD II in the setting of sepsis; likely ATN;  -gentle IVF, close monitor I/O, daily weight; urine output   7. Acute anemia - stable with last value at 8.4  Code Status: stable Family Communication: None at bedside Disposition Plan: Pending recommendations from specialist involved   Consultants:  General surgery  IR  Cardiology  Procedures:  pending  Antibiotics:  Vancomycin and zosyn>>>02/17/14  Ampicillin-Sulbactam  HPI/Subjective: Pt has no new complaints. No acute issues reported to me overnight.  Objective: Filed Vitals:   02/20/14 1233  BP: 123/79  Pulse: 81  Temp: 98.3 F (36.8 C)  Resp: 17    Intake/Output Summary  (Last 24 hours) at 02/20/14 1418 Last data filed at 02/20/14 1220  Gross per 24 hour  Intake    715 ml  Output    735 ml  Net    -20 ml   Filed Weights   02/16/14 0550 02/17/14 0625 02/18/14 0500  Weight: 82.056 kg (180 lb 14.4 oz) 84.233 kg (185 lb 11.2 oz) 83.779 kg (184 lb 11.2 oz)    Exam:   General:  Pt in nad, alert and awake  Cardiovascular: s1 and s2 present, no mrg  Respiratory: cta bl, no wheezes  Abdomen: soft, pain on palpation , no guarding  Musculoskeletal: no clubbing or cyanosis   Data Reviewed: Basic Metabolic Panel:  Recent Labs Lab 02/14/14 0813 02/16/14 0755 02/17/14 0103 02/20/14 0639  NA 144 142 137  --   K 4.1 4.0 3.6*  --   CL 100 101 99  --   CO2 29 31 28   --   GLUCOSE 116* 106* 115*  --   BUN 56* 27* 20  --   CREATININE 1.80* 1.20 1.03  --   CALCIUM 9.3 8.7 8.6  --   MG  --   --   --  2.0   Liver Function Tests:  Recent Labs Lab 02/14/14 0813 02/16/14 0755  AST 19 19  ALT 7 7  ALKPHOS 77 83  BILITOT 1.2 1.5*  PROT 6.8 6.2  ALBUMIN 2.4* 2.2*   No results for input(s): LIPASE, AMYLASE in the last 168 hours. No results for input(s): AMMONIA in the last 168 hours. CBC:  Recent Labs Lab 02/13/14 2350 02/14/14 0813  02/16/14 0755  02/17/14 0103  02/18/14 0618 02/18/14 1900 02/19/14 0750 02/19/14 1916 02/20/14 0639  WBC 17.6* 17.2*  --  12.8*  --  10.6*  --   --   --   --   --   --   HGB 8.7* 8.7*  < > 7.5*  < > 7.3*  < > 7.5* 8.2* 8.4* 8.5* 8.1*  HCT 26.3* 27.3*  < > 23.6*  < > 22.3*  < > 24.1* 26.0* 26.6* 27.1* 26.3*  MCV 86.5 84.5  --  84.6  --  87.1  --   --   --   --   --   --   PLT 276 257  --  258  --  255  --   --   --   --   --   --   < > = values in this interval not displayed. Cardiac Enzymes: No results for input(s): CKTOTAL, CKMB, CKMBINDEX, TROPONINI in the last 168 hours. BNP (last 3 results)  Recent Labs  06/16/13 1554 06/17/13 0537 07/09/13 0555  PROBNP 3560.0* 3222.0* 3842.0*   CBG: No  results for input(s): GLUCAP in the last 168 hours.  No results found for this or any previous visit (from the past 240 hour(s)).   Studies: Ct Abdomen Pelvis W Contrast  02/20/2014   CLINICAL DATA:  Ruptured appendicitis  EXAM: CT ABDOMEN AND PELVIS WITH CONTRAST  TECHNIQUE: Multidetector CT imaging of the abdomen and pelvis was performed using the standard protocol following bolus administration of intravenous contrast.  CONTRAST:  162mL OMNIPAQUE IOHEXOL 300 MG/ML  SOLN  COMPARISON:  02/15/2014  FINDINGS: Lower chest: Moderate cardiac enlargement. Moderate bilateral pleural effusions are identified. Atelectasis is noted within both lower lobes.  Hepatobiliary: Reflux of contrast material from the right side of heart into the hepatic veins identified compatible with passive venous congestion of the liver. No focal liver abnormality noted. Gallbladder is negative. No biliary dilatation.  Pancreas: Appears normal.  Spleen: Normal.  Adrenals/Urinary Tract: The adrenal glands are both normal. Unremarkable appearance of the kidneys an urinary bladder.  Stomach/Bowel: The stomach appears normal. There is an NG tube within the body of stomach. The small bowel loops are mildly increased in caliber measuring up to 3.7 cm. There is enteric contrast material throughout the small and large bowel loops up to the level of the rectum. Improvement in distal small bowel wall thickening. Right lower quadrant pigtail drainage catheter is identified. There has been resolution of the periappendiceal abscess. No new fluid collections identified.  Vascular/Lymphatic: Calcified atherosclerotic disease involves the abdominal aorta. No aneurysm. No enlarged retroperitoneal or mesenteric adenopathy. No enlarged pelvic or inguinal lymph nodes.  Reproductive: Prostate gland and seminal vesicles appear normal.  Other: Moderate abdominal and pelvic ascites identified.  Musculoskeletal: Multi level degenerative disc disease noted  throughout the lower lumbar spine. No aggressive lytic or sclerotic bone lesions identified. There are bilateral L5 pars defects noted.  IMPRESSION: 1. Resolution of right lower quadrant abscess status post drainage placement. 2. Moderate abdominal and pelvic ascites. There is also moderate bilateral pleural effusions and reflux of contrast material from the right atrium into the hepatic veins. Findings are concerning for moderate right heart failure. 3. Suspect continued small bowel enteritis with mild increase caliber of small bowel loops without evidence for bowel obstruction.   Electronically Signed   By: Kerby Moors M.D.   On: 02/20/2014 14:10    Scheduled Meds: . allopurinol  300 mg Oral Daily  . ampicillin-sulbactam (UNASYN) IV  3 g Intravenous Q6H  . antiseptic oral rinse  7 mL Mouth Rinse BID  . brimonidine  1 drop Both Eyes Q12H   And  . timolol  1 drop Both Eyes Q12H  . budesonide-formoterol  2 puff Inhalation BID  . latanoprost  1 drop Both Eyes QHS  . metoprolol  5 mg Intravenous 4 times per day  . sodium chloride  3 mL Intravenous Q12H  . tiotropium  18 mcg Inhalation Daily   Continuous Infusions: . dextrose 5 % and 0.45 % NaCl with KCl 20 mEq/L 75 mL/hr at 02/20/14 1130  . heparin 1,150 Units/hr (02/20/14 1334)     Time spent: > 35 minutes    [name]  [facility] Pager [phone]  If 7PM-7AM, please contact night-coverage at [email or web address], password [email or web address] 02/20/2014, 2:18 PM  LOS: 10 days

## 2014-02-20 NOTE — Progress Notes (Addendum)
INITIAL NUTRITION ASSESSMENT  DOCUMENTATION CODES Per approved criteria  -Not Applicable   INTERVENTION: -RD to follow for diet advancement -Consider initiation of TPN if PO diet is not medically indidcated  NUTRITION DIAGNOSIS: Inadequate oral intake related to altered GI function as evidenced by NPO.   Goal: PO will meet >90% of estimated nutritional needs  Monitor:  Diet advancement, PO intake, labs, weight changes, I/O's, initiation of nutrition support measures  Reason for Assessment: NPO/clear liquids x 10 days  73 y.o. male  Admitting Dx: Appendicitis  pt sent from [facility] due to perforated appendix and hypocoagulable state. Seen by Dr Arnoldo Morale at [facility] and felt that non operative management best. Pt states his RLQ abdominal pain started Monday and has been about the same. Sharp RLQ made worse when he moves around. CT shows perforated appendix and phlegmon. Some SOB and hx of CHF.   ASSESSMENT: Pt s/p drainage of RLQ abcess by IR on 02/10/14. He was transferred to [facility] from [facility] d/t multiple fluid collections/abscess and terminal ileum twisted with possible necrosis. Perc drain was placed on 02/15/14. NGT was been reinserted as of 02/19/14.  Pt has been NPO with minimal consumption of clear liquids x 10 days. Reviewed MD notes, which reveal probable initiation of TPN if pt unable to progress. Wt of 184# likely an outlier. UBW 145#. Wt gain likely due to scale error as well as abdominal distention.  Labs reviewed. K:3.6, Glucose:115.   Height: Ht Readings from Last 1 Encounters:  02/10/14 5\' 6"  (1.676 m)    Weight: Wt Readings from Last 1 Encounters:  02/18/14 184 lb 11.2 oz (83.779 kg)    Ideal Body Weight: 154#  % Ideal Body Weight: 119%  Wt Readings from Last 10 Encounters:  02/18/14 184 lb 11.2 oz (83.779 kg)  01/23/14 142 lb 9.6 oz (64.683 kg)  12/30/13 141 lb 1.9 oz (64.012 kg)  12/02/13 142 lb (64.411 kg)  11/28/13 142 lb 12.8 oz (64.774 kg)  11/21/13 142 lb  (64.411 kg)  11/19/13 142 lb 11.2 oz (64.728 kg)  11/11/13 145 lb (65.772 kg)  11/10/13 145 lb (65.772 kg)  10/27/13 144 lb (65.318 kg)    Usual Body Weight: 145#  % Usual Body Weight: 127%  BMI:  Body mass index is 29.83 kg/(m^2). Overweight  Estimated Nutritional Needs: Kcal: 1700-1900 Protein: 78-98 grams Fluid: 1.7-1.9 L *used UBW of 145#  Skin: cracked skin on feet  Diet Order: Diet NPO time specified Except for: Ice Chips  EDUCATION NEEDS: -Education not appropriate at this time   Intake/Output Summary (Last 24 hours) at 02/20/14 1259 Last data filed at 02/20/14 1220  Gross per 24 hour  Intake   1375 ml  Output    815 ml  Net    560 ml    Last BM: 02/17/14   Labs:   Recent Labs Lab 02/14/14 0813 02/16/14 0755 02/17/14 0103 02/20/14 0639  NA 144 142 137  --   K 4.1 4.0 3.6*  --   CL 100 101 99  --   CO2 29 31 28   --   BUN 56* 27* 20  --   CREATININE 1.80* 1.20 1.03  --   CALCIUM 9.3 8.7 8.6  --   MG  --   --   --  2.0  GLUCOSE 116* 106* 115*  --     CBG (last 3)  No results for input(s): GLUCAP in the last 72 hours.  Scheduled Meds: . allopurinol  300 mg  Oral Daily  . ampicillin-sulbactam (UNASYN) IV  3 g Intravenous Q6H  . antiseptic oral rinse  7 mL Mouth Rinse BID  . brimonidine  1 drop Both Eyes Q12H   And  . timolol  1 drop Both Eyes Q12H  . budesonide-formoterol  2 puff Inhalation BID  . latanoprost  1 drop Both Eyes QHS  . metoprolol  5 mg Intravenous 4 times per day  . sodium chloride  3 mL Intravenous Q12H  . tiotropium  18 mcg Inhalation Daily    Continuous Infusions: . dextrose 5 % and 0.45 % NaCl with KCl 20 mEq/L 75 mL/hr at 02/20/14 1130  . heparin 1,150 Units/hr (02/19/14 1438)    Past Medical History  Diagnosis Date  . Glaucoma     uses eye drops daily  . Allergic rhinitis     takes Singulair at bedtime  . Atrial fibrillation     takes Coumadin daily  . Diastolic heart failure     LVEF 00-86%, rate 2 diastolic  dysfunction 09/6193  . Pulmonary hypertension     70 mmHg 03/2012  . Asthma     Albuterol neb and inhaler as needed  . Anxiety     takes Xanax daily as needed  . COPD (chronic obstructive pulmonary disease)     Symbicort daily  . Essential hypertension, benign     takes Metoprolol and Diltiazem daily  . Anemia     takes ferrous sulfate daily  . History of bronchitis   . Shortness of breath     with exertion  . Numbness     fingertips on both hands  . Lung nodules   . History of blood transfusion     Past Surgical History  Procedure Laterality Date  . Bilateral cataract surgery    . Herniorrhapy    . Tendon repair      Right hand surgical procedure for a tendon repair  . Colonoscopy N/A 11/29/2012    Procedure: COLONOSCOPY;  Surgeon: Danie Binder, MD;  [facility];  Service: Endoscopy;  Laterality: N/A;  1:00  . Esophagogastroduodenoscopy N/A 11/29/2012    Procedure: ESOPHAGOGASTRODUODENOSCOPY (EGD);  Surgeon: Danie Binder, MD;  Location: [facility];  Service: Endoscopy;  Laterality: N/A;  . Eye surgery    . Hernia repair    . Radiology with anesthesia N/A 07/10/2013    Procedure: EMBOLIZATION-RADIOLOGY WITH ANESTHESIA;  Surgeon: Rob Hickman, MD;  Location: [place];  Service: Radiology;  Laterality: N/A;  . Nose surgery      d/t nosebleeds  . Video bronchoscopy with endobronchial ultrasound N/A 11/21/2013    Procedure: VIDEO BRONCHOSCOPY WITH ENDOBRONCHIAL ULTRASOUND;  Surgeon: Melrose Nakayama, MD;  Location: [place];  Service: Thoracic;  Laterality: N/A;  . Mediastinoscopy N/A 11/21/2013    Procedure: MEDIASTINOSCOPY;  Surgeon: Melrose Nakayama, MD;  Location: [place];  Service: Thoracic;  Laterality: N/A;    Mahlani Berninger A. Jimmye Norman, RD, LDN Pager: [phone] After hours Pager: [phone]

## 2014-02-20 NOTE — Progress Notes (Signed)
ANTICOAGULATION and ANTIBIOTIC CONSULT NOTE  Pharmacy Consult for Heparin and Unasyn Indication: atrial fibrillation and perforated appendix  Allergies  Allergen Reactions  . Aspirin Other (See Comments)    On coumadin    Patient Measurements: Height: 5\' 6"  (167.6 cm) Weight: 184 lb 11.2 oz (83.779 kg) IBW/kg (Calculated) : 63.8  Heparin dosing weight: 83 kg  Vital Signs: Temp: 98.5 F (36.9 C) (12/11 0623) Temp Source: Oral (12/11 0623) BP: 138/111 mmHg (12/11 0623) Pulse Rate: 71 (12/11 0623)  Labs:  Recent Labs  02/18/14 0618  02/19/14 0005 02/19/14 0347 02/19/14 0750 02/19/14 1916 02/20/14 0407 02/20/14 0639  HGB 7.5*  < >  --   --  8.4* 8.5*  --  8.1*  HCT 24.1*  < >  --   --  26.6* 27.1*  --  26.3*  LABPROT 18.5*  --   --   --  19.7*  --   --  20.6*  INR 1.53*  --   --   --  1.66*  --   --  1.75*  HEPARINUNFRC  --   --  0.48 0.53  --   --  0.37  --   < > = values in this interval not displayed.  Estimated Creatinine Clearance: 63.9 mL/min (by C-G formula based on Cr of 1.03).  Assessment: 73 y.o. Male on Coumadin PTA for afib (Home dose 4mg  daily except for 6mg  on Mon and Thur - admit INR 2.51). Coumadin reversed 12/3 with vit K 5mg  SQ for perc drain placement and was held 12/3>12/6 in case further surgery needed. Receivied doses 12/7 & 12/8. Now being held due to NGT placement for ileus and pt NPO-bridging with heparin. Currently on 1150 units/hr and heparin level remains therapeutic his morning at 0.37. INR 1.75 today. Hgb 8.1- stable.  No bleeding noted.  Patient also on Unasyn for perforated appendix with abscess. 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 SCr stable.  Goal of Therapy:  INR 2-3 Monitor platelets by anticoagulation protocol: Yes   Plan:  1. Continue heparin gtt at 1150 units/hr.  2. Daily heparin level and CBC 3. F/u ability to restart coumadin 4. Continue Unasyn 3g IV q6h 5. Follow for renal function  changes, abx LOT  Ruwayda Curet D. Caylie Sandquist, PharmD, BCPS Clinical Pharmacist Pager: [phone] 02/20/2014 10:46 AM

## 2014-02-20 NOTE — Progress Notes (Signed)
CCMD notified RN of patient having 10 beat run of V-Tach.  Patient asymptomatic, sleeping, easily arousable with no complaints. VSS. MD notified. Awaiting response or new orders.

## 2014-02-20 NOTE — Progress Notes (Signed)
[facility] Progress Note     Subjective: Pt doing much better.  Not much pain.  No N/V, chewing gum and lifesavers.  NG in place with 388mL/24hr.  Had some flatus yesterday, none today.  No BM since 12/8.  Objective: Vital signs in last 24 hours: Temp:  [97.9 F (36.6 C)-98.9 F (37.2 C)] 98.5 F (36.9 C) (12/11 0623) Pulse Rate:  [55-104] 71 (12/11 0623) Resp:  [18-20] 20 (12/11 0623) BP: (130-138)/(65-111) 138/111 mmHg (12/11 0623) SpO2:  [96 %-100 %] 99 % (12/11 0925) Last BM Date: 02/17/14  Intake/Output from previous day: 12/10 0701 - 12/11 0700 In: 835 [P.O.:170; I.V.:560; IV Piggyback:100] Out: 665 [Urine:360; Emesis/NG output:300; Drains:5] Intake/Output this shift: Total I/O In: 0  Out: 100 [Urine:100]  PE: Gen:  Alert, NAD, pleasant Abd: Soft, NT/ND, +BS, no HSM, incisions C/D/I, drain with minimal serous yellow straw drainage   Lab Results:   Recent Labs  02/19/14 1916 02/20/14 0639  HGB 8.5* 8.1*  HCT 27.1* 26.3*   BMET No results for input(s): NA, K, CL, CO2, GLUCOSE, BUN, CREATININE, CALCIUM in the last 72 hours. PT/INR  Recent Labs  02/19/14 0750 02/20/14 0639  LABPROT 19.7* 20.6*  INR 1.66* 1.75*   CMP     Component Value Date/Time   NA 137 02/17/2014 0103   K 3.6* 02/17/2014 0103   CL 99 02/17/2014 0103   CO2 28 02/17/2014 0103   GLUCOSE 115* 02/17/2014 0103   BUN 20 02/17/2014 0103   CREATININE 1.03 02/17/2014 0103   CREATININE 1.07 09/29/2013 1132   CALCIUM 8.6 02/17/2014 0103   PROT 6.2 02/16/2014 0755   ALBUMIN 2.2* 02/16/2014 0755   AST 19 02/16/2014 0755   ALT 7 02/16/2014 0755   ALKPHOS 83 02/16/2014 0755   BILITOT 1.5* 02/16/2014 0755   GFRNONAA 69* 02/17/2014 0103   GFRNONAA 68 09/29/2013 1132   GFRAA 81* 02/17/2014 0103   GFRAA 79 09/29/2013 1132   Lipase     Component Value Date/Time   LIPASE 9* 02/10/2014 1106       Studies/Results: Dg Abd Portable 1v  02/18/2014   CLINICAL DATA:  Acute  lower abdominal pain and abdominal distention. Right lower quadrant percutaneous abscess drainage 3 days ago, likely secondary to appendiceal rupture.  EXAM: PORTABLE ABDOMEN - 1 VIEW  COMPARISON:  CT abdomen at time of abscess drainage 02/15/2014. Unenhanced CT abdomen and pelvis 02/14/2014, 02/10/2014. One-view abdomen x-ray 02/12/2014, 02/11/2014.  FINDINGS: Abscess drainage catheter in the right lower quadrant. Persistent moderate to marked gaseous distention of a dilated loop of jejunum in the left upper quadrant. Remainder of the small bowel of normal caliber. Moderate gaseous distention of the stomach with interval nasogastric tube removal. Oral contrast material from the CT 4 days ago remains in the normal caliber colon. No suggestion of free air on the supine image. Airspace consolidation with air bronchograms medially in the visualized lower lobes, unchanged  IMPRESSION: 1. Stable moderate to marked dilation of a loop of jejunum in the left upper quadrant. As there was no transition point on the prior CT, this likely reflects a localized ileus or atonic segment. 2. Moderate gaseous distention of the stomach with interval NG tube removal. 3. Stable atelectasis versus pneumonia in the medial lower lobes bilaterally.   Electronically Signed   By: Evangeline Dakin M.D.   On: 02/18/2014 10:44    Anti-infectives: Anti-infectives    Start     Dose/Rate Route Frequency Ordered Stop   02/17/14  1200  Ampicillin-Sulbactam (UNASYN) 3 g in sodium chloride 0.9 % 100 mL IVPB     3 g100 mL/hr over 60 Minutes Intravenous Every 6 hours 02/17/14 1110     02/12/14 2000  piperacillin-tazobactam (ZOSYN) IVPB 3.375 g  Status:  Discontinued     3.375 g12.5 mL/hr over 240 Minutes Intravenous Every 8 hours 02/12/14 1753 02/17/14 1042   02/12/14 2000  vancomycin (VANCOCIN) IVPB 1000 mg/200 mL premix  Status:  Discontinued     1,000 mg200 mL/hr over 60 Minutes Intravenous Every 24 hours 02/12/14 1753 02/17/14 1042    02/11/14 1700  vancomycin (VANCOCIN) IVPB 1000 mg/200 mL premix  Status:  Discontinued     1,000 mg200 mL/hr over 60 Minutes Intravenous Every 24 hours 02/10/14 1607 02/12/14 1753   02/10/14 2200  piperacillin-tazobactam (ZOSYN) IVPB 3.375 g  Status:  Discontinued     3.375 g12.5 mL/hr over 240 Minutes Intravenous Every 8 hours 02/10/14 1602 02/12/14 1753   02/10/14 1700  vancomycin (VANCOCIN) 1,250 mg in sodium chloride 0.9 % 250 mL IVPB     1,250 mg166.7 mL/hr over 90 Minutes Intravenous  Once 02/10/14 1605 02/10/14 1934   02/10/14 1345  piperacillin-tazobactam (ZOSYN) IVPB 3.375 g     3.375 g100 mL/hr over 30 Minutes Intravenous  Once 02/10/14 1344 02/10/14 1452       Assessment/Plan 1. Acute appendicitis with perforation and intraabdominal abscess. Being treated conservative with Perc drain placed 02/15/2014. On Unasyn. CT A/P with contrast to be repeated today. Monitor BMET and Cr due to some acute but now resolved renal failure when he arrived here 2. COPD: stable 3. Chronic Afib: currently rate controlled on lopressor. On IV heparin.  4. Ileus: NPO with NGT. 300cc out over last 24 hours. Will continue. CT to be repeated today, 02/20/2014. Needs to be OOB to chair and ambulate. May clamp NGT for ambulation.  5. Anemia: stabilizing with H/H 8/26 today. Check daily. 6. Encouraged IS, SCD's 7. Consider PICC and TPN if not improving    LOS: 10 days    DORT, Chatara Lucente 02/20/2014, 9:38 AM Pager: [phone]

## 2014-02-20 NOTE — Progress Notes (Signed)
Physical Therapy Treatment Patient Details Name: Theodore [name] MRN: [id number] DOB: 10-23-1940 Today's Date: 02/20/2014    History of Present Illness Patient is a 73 y/o male presents with acute RLQ pain due to acute perforated appendicitis with abscess, s/p perc drain on 02-15-14. PMH of A-fib, diastolic heart failure, pulmonary HTN, COPD, anxiety, HTN and anemia. NG tube to suction. CT abdomen-Moderate abdominal and pelvic ascites. Bil pleural effusions and reflux of contrast material from the right atrium into the hepatic veins concering for right sided heart failure.     PT Comments    Patient not progressing with mobility today due to increased dyspnea with all activity. Required long standing/seated rest breaks during gait training. Poor activity tolerance worsened from prior sessions most likely due to new fluid build up in right side of heart. Plan to diurese today per RN. Education provided on performing short bouts of activity with longer rest breaks until breathing improves. Will continue to follow acutely.   Follow Up Recommendations  Home health PT;Supervision/Assistance - 24 hour     Equipment Recommendations  Cane    Recommendations for Other Services       Precautions / Restrictions Precautions Precautions: Fall Precaution Comments: drain; NG tube, monitor 02 Restrictions Weight Bearing Restrictions: No    Mobility  Bed Mobility               General bed mobility comments: received sitting EOB upon PT arrival.   Transfers Overall transfer level: Needs assistance Equipment used: None Transfers: Sit to/from Stand Sit to Stand: Min guard         General transfer comment: Min guard for safety due to management of IV lines, drain and 02 tubing. Stood from [employer], from toilet x1.  Ambulation/Gait Ambulation/Gait assistance: Min guard Ambulation Distance (Feet): 50 Feet Assistive device: None (Rail) Gait Pattern/deviations: Step-through  pattern;Decreased stride length Gait velocity: decreased   General Gait Details: Required 3 long standing rest breaks during gait training due to dyspnea. Leaning on rail in hallway as position of comfort. not able to obtain SA02 reading due to cold fingers. Ambulated to/from bathroom with long seated rest break due to SOB.   Stairs            Wheelchair Mobility    Modified Rankin (Stroke Patients Only)       Balance Overall balance assessment: Needs assistance Sitting-balance support: Feet supported;No upper extremity supported Sitting balance-Leahy Scale: Good     Standing balance support: During functional activity Standing balance-Leahy Scale: Fair Standing balance comment: Steady during functional balance tasks during toileting and donning/doffing extra gown.                     Cognition Arousal/Alertness: Awake/alert Behavior During Therapy: WFL for tasks assessed/performed Overall Cognitive Status: Within Functional Limits for tasks assessed                      Exercises      General Comments        Pertinent Vitals/Pain Pain Assessment: No/denies pain    Home Living                      Prior Function            PT Goals (current goals can now be found in the care plan section) Progress towards PT goals: Not progressing toward goals - comment    Frequency  Min 4X/week  PT Plan Current plan remains appropriate    Co-evaluation             End of Session Equipment Utilized During Treatment: Gait belt;Oxygen Activity Tolerance: Patient limited by fatigue;Other (comment) (Dyspnea) Patient left: in bed;with call bell/phone within reach;with bed alarm set     Time: 1427-1500 PT Time Calculation (min) (ACUTE ONLY): 33 min  Charges:  $Gait Training: 8-22 mins $Therapeutic Exercise: 8-22 mins                    G CodesCandy Sledge A 25-Feb-2014, 3:22 PM  Candy Sledge, [place], DPT [phone]

## 2014-02-20 NOTE — Progress Notes (Signed)
Referring Physician(s): CCS  Subjective:  Perf appendicitis RLQ abscess drain placed 12/6 Output minimal CT today  Allergies: Aspirin  Medications: Prior to Admission medications   Medication Sig Start Date End Date Taking? Authorizing Provider  albuterol (PROAIR HFA) 108 (90 BASE) MCG/ACT inhaler Inhale 2 puffs into the lungs every 6 (six) hours as needed for wheezing or shortness of breath. 05/23/13  Yes Fayrene Helper, MD  albuterol (PROVENTIL) (2.5 MG/3ML) 0.083% nebulizer solution Take 2.5 mg by nebulization every 8 (eight) hours as needed for wheezing or shortness of breath.   Yes Historical Provider, MD  allopurinol (ZYLOPRIM) 300 MG tablet Take 1 tablet (300 mg total) by mouth daily. 05/23/13  Yes Fayrene Helper, MD  ALPRAZolam Duanne Moron) 0.25 MG tablet Take 0.25 mg by mouth daily as needed for anxiety.   Yes Historical Provider, MD  brimonidine-timolol (COMBIGAN) 0.2-0.5 % ophthalmic solution Place 1 drop into both eyes every 12 (twelve) hours.   Yes Historical Provider, MD  budesonide-formoterol (SYMBICORT) 160-4.5 MCG/ACT inhaler Inhale 2 puffs into the lungs 2 (two) times daily. 05/23/13  Yes Fayrene Helper, MD  diltiazem (CARDIZEM CD) 180 MG 24 hr capsule Take 1 capsule (180 mg total) by mouth daily. 09/22/13  Yes Herminio Commons, MD  mirtazapine (REMERON) 15 MG tablet Take 1 tablet (15 mg total) by mouth at bedtime. 12/30/13  Yes Fayrene Helper, MD  montelukast (SINGULAIR) 10 MG tablet Take 10 mg by mouth at bedtime.   Yes Historical Provider, MD  potassium chloride (K-DUR) 10 MEQ tablet Take 10 mEq by mouth 2 (two) times daily.  06/13/13  Yes Tanna Furry, MD  tiotropium (SPIRIVA) 18 MCG inhalation capsule Place 18 mcg into inhaler and inhale daily.   Yes Historical Provider, MD  torsemide (DEMADEX) 20 MG tablet Take 2 tablets (40 mg total) by mouth daily. 10/02/13  Yes Herminio Commons, MD  traMADol (ULTRAM) 50 MG tablet Take 50 mg by mouth daily as needed  (pain).   Yes Historical Provider, MD  travoprost, benzalkonium, (TRAVATAN) 0.004 % ophthalmic solution Place 1 drop into both eyes at bedtime.   Yes Historical Provider, MD  traZODone (DESYREL) 50 MG tablet Take 0.5-1 tablets (25-50 mg total) by mouth at bedtime as needed for sleep. 03/13/14  Yes Fayrene Helper, MD  warfarin (COUMADIN) 4 MG tablet Take 1.5 tabs (6 mg) on Monday and Thursday and 1 tab (4mg ) on other days Patient taking differently: Take 4-6 mg by mouth daily. Take 1.5 tabs (6 mg) on Monday and Thursday and 1 tab (4mg ) on other days 02/09/14  Yes Herminio Commons, MD  albuterol (PROVENTIL) (2.5 MG/3ML) 0.083% nebulizer solution INHALE THE CONTENTS OF 1 VIAL USING THE NEBULIZER EVERY 6 HOURS AS NEEDED Patient not taking: Reported on 02/10/2014 01/13/14   Fayrene Helper, MD  traMADol (ULTRAM) 50 MG tablet TAKE 1 TABLET BY MOUTH EVERY DAY AS NEEDED Patient not taking: Reported on 02/10/2014 11/19/13   Fayrene Helper, MD    Review of Systems  Vital Signs: BP 123/79 mmHg  Pulse 81  Temp(Src) 98.3 F (36.8 C) (Oral)  Resp 17  Ht 5\' 6"  (1.676 m)  Wt 83.779 kg (184 lb 11.2 oz)  BMI 29.83 kg/m2  SpO2 96%  Physical Exam  Abdominal: Soft. Bowel sounds are normal. He exhibits no distension. There is no tenderness.  RLQ abscess drain site: NT No bleeding Clean and dry afeb Wbc wnl I dont see a cx result CT 12/11:  resolved abscess    Imaging: Ct Abdomen Pelvis W Contrast  02/20/2014   CLINICAL DATA:  Ruptured appendicitis  EXAM: CT ABDOMEN AND PELVIS WITH CONTRAST  TECHNIQUE: Multidetector CT imaging of the abdomen and pelvis was performed using the standard protocol following bolus administration of intravenous contrast.  CONTRAST:  145mL OMNIPAQUE IOHEXOL 300 MG/ML  SOLN  COMPARISON:  02/15/2014  FINDINGS: Lower chest: Moderate cardiac enlargement. Moderate bilateral pleural effusions are identified. Atelectasis is noted within both lower lobes.  Hepatobiliary:  Reflux of contrast material from the right side of heart into the hepatic veins identified compatible with passive venous congestion of the liver. No focal liver abnormality noted. Gallbladder is negative. No biliary dilatation.  Pancreas: Appears normal.  Spleen: Normal.  Adrenals/Urinary Tract: The adrenal glands are both normal. Unremarkable appearance of the kidneys an urinary bladder.  Stomach/Bowel: The stomach appears normal. There is an NG tube within the body of stomach. The small bowel loops are mildly increased in caliber measuring up to 3.7 cm. There is enteric contrast material throughout the small and large bowel loops up to the level of the rectum. Improvement in distal small bowel wall thickening. Right lower quadrant pigtail drainage catheter is identified. There has been resolution of the periappendiceal abscess. No new fluid collections identified.  Vascular/Lymphatic: Calcified atherosclerotic disease involves the abdominal aorta. No aneurysm. No enlarged retroperitoneal or mesenteric adenopathy. No enlarged pelvic or inguinal lymph nodes.  Reproductive: Prostate gland and seminal vesicles appear normal.  Other: Moderate abdominal and pelvic ascites identified.  Musculoskeletal: Multi level degenerative disc disease noted throughout the lower lumbar spine. No aggressive lytic or sclerotic bone lesions identified. There are bilateral L5 pars defects noted.  IMPRESSION: 1. Resolution of right lower quadrant abscess status post drainage placement. 2. Moderate abdominal and pelvic ascites. There is also moderate bilateral pleural effusions and reflux of contrast material from the right atrium into the hepatic veins. Findings are concerning for moderate right heart failure. 3. Suspect continued small bowel enteritis with mild increase caliber of small bowel loops without evidence for bowel obstruction.   Electronically Signed   By: Kerby Moors M.D.   On: 02/20/2014 14:10   Dg Abd Portable  1v  02/18/2014   CLINICAL DATA:  Acute lower abdominal pain and abdominal distention. Right lower quadrant percutaneous abscess drainage 3 days ago, likely secondary to appendiceal rupture.  EXAM: PORTABLE ABDOMEN - 1 VIEW  COMPARISON:  CT abdomen at time of abscess drainage 02/15/2014. Unenhanced CT abdomen and pelvis 02/14/2014, 02/10/2014. One-view abdomen x-ray 02/12/2014, 02/11/2014.  FINDINGS: Abscess drainage catheter in the right lower quadrant. Persistent moderate to marked gaseous distention of a dilated loop of jejunum in the left upper quadrant. Remainder of the small bowel of normal caliber. Moderate gaseous distention of the stomach with interval nasogastric tube removal. Oral contrast material from the CT 4 days ago remains in the normal caliber colon. No suggestion of free air on the supine image. Airspace consolidation with air bronchograms medially in the visualized lower lobes, unchanged  IMPRESSION: 1. Stable moderate to marked dilation of a loop of jejunum in the left upper quadrant. As there was no transition point on the prior CT, this likely reflects a localized ileus or atonic segment. 2. Moderate gaseous distention of the stomach with interval NG tube removal. 3. Stable atelectasis versus pneumonia in the medial lower lobes bilaterally.   Electronically Signed   By: Evangeline Dakin M.D.   On: 02/18/2014 10:44    Labs:  CBC:  Recent Labs  02/13/14 2350 02/14/14 0813  02/16/14 0755  02/17/14 0103  02/18/14 1900 02/19/14 0750 02/19/14 1916 02/20/14 0639  WBC 17.6* 17.2*  --  12.8*  --  10.6*  --   --   --   --   --   HGB 8.7* 8.7*  < > 7.5*  < > 7.3*  < > 8.2* 8.4* 8.5* 8.1*  HCT 26.3* 27.3*  < > 23.6*  < > 22.3*  < > 26.0* 26.6* 27.1* 26.3*  PLT 276 257  --  258  --  255  --   --   --   --   --   < > = values in this interval not displayed.  COAGS:  Recent Labs  04/02/13 0725  11/19/13 1448  02/17/14 0707 02/18/14 0618 02/19/14 0750 02/20/14 0639  INR 1.54*   < > 1.25  < > 1.34 1.53* 1.66* 1.75*  APTT 36  --  42*  --   --   --   --   --   < > = values in this interval not displayed.  BMP:  Recent Labs  02/13/14 0103 02/14/14 0813 02/16/14 0755 02/17/14 0103  NA 140 144 142 137  K 4.0 4.1 4.0 3.6*  CL 96 100 101 99  CO2 28 29 31 28   GLUCOSE 94 116* 106* 115*  BUN 42* 56* 27* 20  CALCIUM 9.2 9.3 8.7 8.6  CREATININE 1.69* 1.80* 1.20 1.03  GFRNONAA 38* 35* 58* 69*  GFRAA 44* 41* 67* 81*    LIVER FUNCTION TESTS:  Recent Labs  02/10/14 1106 02/11/14 0554 02/14/14 0813 02/16/14 0755  BILITOT 1.3* 1.0 1.2 1.5*  AST 18 15 19 19   ALT 6 6 7 7   ALKPHOS 127* 98 77 83  PROT 8.0 7.2 6.8 6.2  ALBUMIN 3.0* 2.6* 2.4* 2.2*    Assessment and Plan: RLQ abscess drain intact CT today shows resolved Wbc wnl afeb Output minimal Plan per CCS Pull drain?    I spent a total of 15 minutes face to face in clinical consultation/evaluation, greater than 50% of which was counseling/coordinating care for abscess drain  Signed: Jaeshawn Silvio A 02/20/2014, 3:49 PM

## 2014-02-21 LAB — BASIC METABOLIC PANEL
ANION GAP: 12 (ref 5–15)
Anion gap: 10 (ref 5–15)
BUN: 8 mg/dL (ref 6–23)
BUN: 8 mg/dL (ref 6–23)
CHLORIDE: 104 meq/L (ref 96–112)
CHLORIDE: 104 meq/L (ref 96–112)
CO2: 28 mEq/L (ref 19–32)
CO2: 25 mEq/L (ref 19–32)
CREATININE: 0.91 mg/dL (ref 0.50–1.35)
Calcium: 8.6 mg/dL (ref 8.4–10.5)
Calcium: 8.7 mg/dL (ref 8.4–10.5)
Creatinine, Ser: 0.86 mg/dL (ref 0.50–1.35)
GFR calc Af Amer: >90 mL/min (ref >90)
GFR calc Af Amer: >90 mL/min (ref >90)
GFR calc non Af Amer: 81 mL/min — ABNORMAL LOW (ref >90)
GFR calc non Af Amer: 83 mL/min — ABNORMAL LOW (ref >90)
GLUCOSE: 102 mg/dL — AB (ref 70–99)
Glucose, Bld: 98 mg/dL (ref 70–99)
POTASSIUM: 4.1 meq/L (ref 3.7–5.3)
POTASSIUM: 4.1 meq/L (ref 3.7–5.3)
Sodium: 142 mEq/L (ref 137–147)
Sodium: 141 mEq/L (ref 137–147)

## 2014-02-21 LAB — PROTIME-INR
INR: 1.79 — AB (ref 0.00–1.49)
Prothrombin Time: 20.9 seconds — ABNORMAL HIGH (ref 11.6–15.2)

## 2014-02-21 LAB — HEPARIN LEVEL (UNFRACTIONATED)
HEPARIN UNFRACTIONATED: 0.28 [IU]/mL — AB (ref 0.30–0.70)
Heparin Unfractionated: 0.37 IU/mL (ref 0.30–0.70)

## 2014-02-21 LAB — CBC
HCT: 23.8 % — ABNORMAL LOW (ref 39.0–52.0)
HEMOGLOBIN: 7.5 g/dL — AB (ref 13.0–17.0)
MCH: 27.6 pg (ref 26.0–34.0)
MCHC: 31.5 g/dL (ref 30.0–36.0)
MCV: 87.5 fL (ref 78.0–100.0)
Platelets: 353 10*3/uL (ref 150–400)
RBC: 2.72 MIL/uL — AB (ref 4.22–5.81)
RDW: 18.8 % — ABNORMAL HIGH (ref 11.5–15.5)
WBC: 11.8 10*3/uL — ABNORMAL HIGH (ref 4.0–10.5)

## 2014-02-21 MED ORDER — FUROSEMIDE 10 MG/ML IJ SOLN: 60.0000 mg | Freq: Once | INTRAMUSCULAR | Status: DC

## 2014-02-21 MED ORDER — FUROSEMIDE 10 MG/ML IJ SOLN
60.0000 mg | Freq: Once | INTRAMUSCULAR | Status: AC
  Administered 2014-02-21: 60 mg via INTRAVENOUS
  Filled 2014-02-21: qty 6

## 2014-02-21 MED ORDER — FUROSEMIDE 10 MG/ML IJ SOLN
20.0000 mg | Freq: Once | INTRAMUSCULAR | Status: AC
  Administered 2014-02-21: 20 mg via INTRAVENOUS
  Filled 2014-02-21: qty 2

## 2014-02-21 NOTE — Progress Notes (Signed)
Patient ID: Theodore [name], male   DOB: 16-Mar-1940, 73 y.o.   MRN: [id number]    Subjective: Feels better today. Had a bowel movement last night. Denies abdominal pain.  Objective: Vital signs in last 24 hours: Temp:  [98.7 F (37.1 C)-99.3 F (37.4 C)] 98.7 F (37.1 C) (12/12 0617) Pulse Rate:  [72-96] 78 (12/12 0617) Resp:  [15-18] 18 (12/12 0617) BP: (121-128)/(62-72) 128/72 mmHg (12/12 0617) SpO2:  [99 %-100 %] 99 % (12/12 0617) Weight:  [196 lb (88.905 kg)] 196 lb (88.905 kg) (12/12 0617) Last BM Date: 02/20/14  Intake/Output from previous day: 12/11 0701 - 12/12 0700 In: 3272.5 [P.O.:340; I.V.:1877.5; NG/GT:650; IV Piggyback:400] Out: 2982.5 [Urine:2350; Emesis/NG output:625; Drains:7.5] Intake/Output this shift:    General appearance: alert, cooperative and no distress GI: normal findings: soft, non-tender and nondistended  Lab Results:   Recent Labs  02/20/14 0639 02/21/14 0436  WBC  --  11.8*  HGB 8.1* 7.5*  HCT 26.3* 23.8*  PLT  --  353   BMET  Recent Labs  02/21/14 0041 02/21/14 0436  NA 142 141  K 4.1 4.1  CL 104 104  CO2 28 25  GLUCOSE 102* 98  BUN 8 8  CREATININE 0.91 0.86  CALCIUM 8.6 8.7     Studies/Results: Ct Abdomen Pelvis W Contrast  02/20/2014   CLINICAL DATA:  Ruptured appendicitis  EXAM: CT ABDOMEN AND PELVIS WITH CONTRAST  TECHNIQUE: Multidetector CT imaging of the abdomen and pelvis was performed using the standard protocol following bolus administration of intravenous contrast.  CONTRAST:  119mL OMNIPAQUE IOHEXOL 300 MG/ML  SOLN  COMPARISON:  02/15/2014  FINDINGS: Lower chest: Moderate cardiac enlargement. Moderate bilateral pleural effusions are identified. Atelectasis is noted within both lower lobes.  Hepatobiliary: Reflux of contrast material from the right side of heart into the hepatic veins identified compatible with passive venous congestion of the liver. No focal liver abnormality noted. Gallbladder is negative. No biliary  dilatation.  Pancreas: Appears normal.  Spleen: Normal.  Adrenals/Urinary Tract: The adrenal glands are both normal. Unremarkable appearance of the kidneys an urinary bladder.  Stomach/Bowel: The stomach appears normal. There is an NG tube within the body of stomach. The small bowel loops are mildly increased in caliber measuring up to 3.7 cm. There is enteric contrast material throughout the small and large bowel loops up to the level of the rectum. Improvement in distal small bowel wall thickening. Right lower quadrant pigtail drainage catheter is identified. There has been resolution of the periappendiceal abscess. No new fluid collections identified.  Vascular/Lymphatic: Calcified atherosclerotic disease involves the abdominal aorta. No aneurysm. No enlarged retroperitoneal or mesenteric adenopathy. No enlarged pelvic or inguinal lymph nodes.  Reproductive: Prostate gland and seminal vesicles appear normal.  Other: Moderate abdominal and pelvic ascites identified.  Musculoskeletal: Multi level degenerative disc disease noted throughout the lower lumbar spine. No aggressive lytic or sclerotic bone lesions identified. There are bilateral L5 pars defects noted.  IMPRESSION: 1. Resolution of right lower quadrant abscess status post drainage placement. 2. Moderate abdominal and pelvic ascites. There is also moderate bilateral pleural effusions and reflux of contrast material from the right atrium into the hepatic veins. Findings are concerning for moderate right heart failure. 3. Suspect continued small bowel enteritis with mild increase caliber of small bowel loops without evidence for bowel obstruction.   Electronically Signed   By: Kerby Moors M.D.   On: 02/20/2014 14:10    Anti-infectives: Anti-infectives    Start  Dose/Rate Route Frequency Ordered Stop   02/17/14 1200  Ampicillin-Sulbactam (UNASYN) 3 g in sodium chloride 0.9 % 100 mL IVPB     3 g100 mL/hr over 60 Minutes Intravenous Every 6 hours  02/17/14 1110     02/12/14 2000  piperacillin-tazobactam (ZOSYN) IVPB 3.375 g  Status:  Discontinued     3.375 g12.5 mL/hr over 240 Minutes Intravenous Every 8 hours 02/12/14 1753 02/17/14 1042   02/12/14 2000  vancomycin (VANCOCIN) IVPB 1000 mg/200 mL premix  Status:  Discontinued     1,000 mg200 mL/hr over 60 Minutes Intravenous Every 24 hours 02/12/14 1753 02/17/14 1042   02/11/14 1700  vancomycin (VANCOCIN) IVPB 1000 mg/200 mL premix  Status:  Discontinued     1,000 mg200 mL/hr over 60 Minutes Intravenous Every 24 hours 02/10/14 1607 02/12/14 1753   02/10/14 2200  piperacillin-tazobactam (ZOSYN) IVPB 3.375 g  Status:  Discontinued     3.375 g12.5 mL/hr over 240 Minutes Intravenous Every 8 hours 02/10/14 1602 02/12/14 1753   02/10/14 1700  vancomycin (VANCOCIN) 1,250 mg in sodium chloride 0.9 % 250 mL IVPB     1,250 mg166.7 mL/hr over 90 Minutes Intravenous  Once 02/10/14 1605 02/10/14 1934   02/10/14 1345  piperacillin-tazobactam (ZOSYN) IVPB 3.375 g     3.375 g100 mL/hr over 30 Minutes Intravenous  Once 02/10/14 1344 02/10/14 1452      Assessment/Plan: Status post percutaneous drainage of appendiceal abscess. Atrial fibrillation-on IV heparin Abscess and previous small bowel obstruction appears resolved on CT scan yesterday. Can likely remove the drainage catheter-per IR DC NG tube, nothing by mouth for now    LOS: 11 days    Namiko Pritts T 02/21/2014

## 2014-02-21 NOTE — Progress Notes (Signed)
[facility] for Heparin Indication: atrial fibrillation  Allergies  Allergen Reactions  . Aspirin Other (See Comments)    On coumadin    Patient Measurements: Height: 5\' 6"  (167.6 cm) Weight: 196 lb (88.905 kg) IBW/kg (Calculated) : 63.8  Heparin dosing weight: 83 kg  Vital Signs: Temp: 98.7 F (37.1 C) (12/12 0617) Temp Source: Oral (12/12 0617) BP: 128/72 mmHg (12/12 0617) Pulse Rate: 78 (12/12 0617)  Labs:  Recent Labs  02/19/14 0750 02/19/14 1916 02/20/14 0407 02/20/14 0639 02/21/14 0041 02/21/14 0436 02/21/14 1607  HGB 8.4* 8.5*  --  8.1*  --  7.5*  --   HCT 26.6* 27.1*  --  26.3*  --  23.8*  --   PLT  --   --   --   --   --  353  --   LABPROT 19.7*  --   --  20.6*  --  20.9*  --   INR 1.66*  --   --  1.75*  --  1.79*  --   HEPARINUNFRC  --   --  0.37  --   --  0.28* 0.37  CREATININE  --   --   --   --  0.91 0.86  --     Estimated Creatinine Clearance: 78.7 mL/min (by C-G formula based on Cr of 0.86).  Assessment: 73 year old male continues on IV heparin for Afib PM Heparin level is therapeutic  Goal of Therapy:  INR 2-3 Monitor platelets by anticoagulation protocol: Yes   Plan:  Continue heparin at 1250 units / hr Follow up am labs  Thank you. Anette Guarneri, PharmD [phone]  02/21/2014 5:34 PM

## 2014-02-21 NOTE — Plan of Care (Signed)
Problem: Phase II Progression Outcomes Goal: IV changed to normal saline lock Outcome: Completed/Met Date Met:  02/21/14 Regular IV line saline locked. Still has Heparin drip.

## 2014-02-21 NOTE — Plan of Care (Signed)
Problem: Phase III Progression Outcomes Goal: Activity at appropriate level-compared to baseline (UP IN CHAIR FOR HEMODIALYSIS)  Outcome: Completed/Met Date Met:  02/21/14 Has sat in recliner most of afternoon. States it feels good.     

## 2014-02-21 NOTE — Progress Notes (Signed)
[facility] PROGRESS NOTE  Theodore [name] IRJ:[id number] DOB: 08/15/1940 DOA: 02/10/2014 PCP: Tula Nakayama, MD  Assessment/Plan:  1. Perforated appendix with abscess/sepsis; CT: Findings are concerning for ruptured appendicitis with an associated approximately 4.5 x 3.2 cm abscess  - Cardiology consulted for surgical clearance, currently no plans for further cardiac testing - Pt is s/p drain placement by IR,plan is to remove drain - Pt is npo - General surgery on board.  2. CHF, diastolic HF; echo (3016): LVEF 01%, diastolic dysfunction, pulmonary HTN PASP 74, TR -CT reports moderate right heart failure - new problem as such will increase lasix dose and monitor output closely - saline locked patient  3. COPD on home oxygen supplementation; no wheezing on exam; cont supplemental oxygen, bronchodilators as needed   4. CT: Bilateral lower lobe bronchiectatic changes and consolidation are suspicious for chronic aspiration pneumonitis -Speech therapy evaluation  5. A. fib on Coumadin; HR is stable; coumadin on hold for procedure. INR currently trending down - Coumadin on hold due to NPO status.  - on Heparin  6. AKI on CKD II in the setting of sepsis; likely ATN;  -gentle IVF, close monitor I/O, daily weight; urine output   7. Acute anemia - stable with last value at 8.4  Code Status: stable Family Communication: None at bedside Disposition Plan: Pending recommendations from specialist involved   Consultants:  General surgery  IR  Cardiology  Procedures:  pending  Antibiotics:  Vancomycin and zosyn>>>02/17/14  Ampicillin-Sulbactam  HPI/Subjective: Pt has no new complaints. No acute issues reported to me overnight.  Objective: Filed Vitals:   02/21/14 0617  BP: 128/72  Pulse: 78  Temp: 98.7 F (37.1 C)  Resp: 18    Intake/Output Summary (Last 24 hours) at 02/21/14 1618 Last data filed at 02/21/14 1459  Gross per 24 hour  Intake    345  ml  Output 4107.5 ml  Net -3762.5 ml   Filed Weights   02/17/14 0625 02/18/14 0500 02/21/14 0617  Weight: 84.233 kg (185 lb 11.2 oz) 83.779 kg (184 lb 11.2 oz) 88.905 kg (196 lb)    Exam:   General:  Pt in nad, alert and awake  Cardiovascular: s1 and s2 present, no mrg  Respiratory: cta bl, no wheezes  Abdomen: soft, pain on palpation , no guarding  Musculoskeletal: no clubbing or cyanosis   Data Reviewed: Basic Metabolic Panel:  Recent Labs Lab 02/16/14 0755 02/17/14 0103 02/20/14 0639 02/21/14 0041 02/21/14 0436  NA 142 137  --  142 141  K 4.0 3.6*  --  4.1 4.1  CL 101 99  --  104 104  CO2 31 28  --  28 25  GLUCOSE 106* 115*  --  102* 98  BUN 27* 20  --  8 8  CREATININE 1.20 1.03  --  0.91 0.86  CALCIUM 8.7 8.6  --  8.6 8.7  MG  --   --  2.0  --   --    Liver Function Tests:  Recent Labs Lab 02/16/14 0755  AST 19  ALT 7  ALKPHOS 83  BILITOT 1.5*  PROT 6.2  ALBUMIN 2.2*   No results for input(s): LIPASE, AMYLASE in the last 168 hours. No results for input(s): AMMONIA in the last 168 hours. CBC:  Recent Labs Lab 02/16/14 0755  02/17/14 0103  02/18/14 1900 02/19/14 0750 02/19/14 1916 02/20/14 0639 02/21/14 0436  WBC 12.8*  --  10.6*  --   --   --   --   --  11.8*  HGB 7.5*  < > 7.3*  < > 8.2* 8.4* 8.5* 8.1* 7.5*  HCT 23.6*  < > 22.3*  < > 26.0* 26.6* 27.1* 26.3* 23.8*  MCV 84.6  --  87.1  --   --   --   --   --  87.5  PLT 258  --  255  --   --   --   --   --  353  < > = values in this interval not displayed. Cardiac Enzymes: No results for input(s): CKTOTAL, CKMB, CKMBINDEX, TROPONINI in the last 168 hours. BNP (last 3 results)  Recent Labs  06/16/13 1554 06/17/13 0537 07/09/13 0555  PROBNP 3560.0* 3222.0* 3842.0*   CBG: No results for input(s): GLUCAP in the last 168 hours.  No results found for this or any previous visit (from the past 240 hour(s)).   Studies: Ct Abdomen Pelvis W Contrast  02/20/2014   CLINICAL DATA:   Ruptured appendicitis  EXAM: CT ABDOMEN AND PELVIS WITH CONTRAST  TECHNIQUE: Multidetector CT imaging of the abdomen and pelvis was performed using the standard protocol following bolus administration of intravenous contrast.  CONTRAST:  143mL OMNIPAQUE IOHEXOL 300 MG/ML  SOLN  COMPARISON:  02/15/2014  FINDINGS: Lower chest: Moderate cardiac enlargement. Moderate bilateral pleural effusions are identified. Atelectasis is noted within both lower lobes.  Hepatobiliary: Reflux of contrast material from the right side of heart into the hepatic veins identified compatible with passive venous congestion of the liver. No focal liver abnormality noted. Gallbladder is negative. No biliary dilatation.  Pancreas: Appears normal.  Spleen: Normal.  Adrenals/Urinary Tract: The adrenal glands are both normal. Unremarkable appearance of the kidneys an urinary bladder.  Stomach/Bowel: The stomach appears normal. There is an NG tube within the body of stomach. The small bowel loops are mildly increased in caliber measuring up to 3.7 cm. There is enteric contrast material throughout the small and large bowel loops up to the level of the rectum. Improvement in distal small bowel wall thickening. Right lower quadrant pigtail drainage catheter is identified. There has been resolution of the periappendiceal abscess. No new fluid collections identified.  Vascular/Lymphatic: Calcified atherosclerotic disease involves the abdominal aorta. No aneurysm. No enlarged retroperitoneal or mesenteric adenopathy. No enlarged pelvic or inguinal lymph nodes.  Reproductive: Prostate gland and seminal vesicles appear normal.  Other: Moderate abdominal and pelvic ascites identified.  Musculoskeletal: Multi level degenerative disc disease noted throughout the lower lumbar spine. No aggressive lytic or sclerotic bone lesions identified. There are bilateral L5 pars defects noted.  IMPRESSION: 1. Resolution of right lower quadrant abscess status post drainage  placement. 2. Moderate abdominal and pelvic ascites. There is also moderate bilateral pleural effusions and reflux of contrast material from the right atrium into the hepatic veins. Findings are concerning for moderate right heart failure. 3. Suspect continued small bowel enteritis with mild increase caliber of small bowel loops without evidence for bowel obstruction.   Electronically Signed   By: Kerby Moors M.D.   On: 02/20/2014 14:10    Scheduled Meds: . allopurinol  300 mg Oral Daily  . ampicillin-sulbactam (UNASYN) IV  3 g Intravenous Q6H  . antiseptic oral rinse  7 mL Mouth Rinse BID  . brimonidine  1 drop Both Eyes Q12H   And  . timolol  1 drop Both Eyes Q12H  . budesonide-formoterol  2 puff Inhalation BID  . furosemide  20 mg Intravenous Once  . furosemide  40 mg Intravenous Daily  .  furosemide  60 mg Intravenous Once  . latanoprost  1 drop Both Eyes QHS  . metoprolol  5 mg Intravenous 4 times per day  . sodium chloride  3 mL Intravenous Q12H  . tiotropium  18 mcg Inhalation Daily   Continuous Infusions: . heparin 1,250 Units/hr (02/21/14 1211)     Time spent: > 35 minutes    [name]  [facility] Pager [phone]  If 7PM-7AM, please contact night-coverage at [email or web address], password [facility] 02/21/2014, 4:18 PM  LOS: 11 days

## 2014-02-21 NOTE — Progress Notes (Signed)
[place] for Heparin Indication: atrial fibrillation  Allergies  Allergen Reactions  . Aspirin Other (See Comments)    On coumadin    Patient Measurements: Height: 5\' 6"  (167.6 cm) Weight: 196 lb (88.905 kg) IBW/kg (Calculated) : 63.8  Heparin dosing weight: 83 kg  Vital Signs: Temp: 98.7 F (37.1 C) (12/12 0617) Temp Source: Oral (12/12 0617) BP: 128/72 mmHg (12/12 0617) Pulse Rate: 78 (12/12 0617)  Labs:  Recent Labs  02/19/14 0347 02/19/14 0750 02/19/14 1916 02/20/14 0407 02/20/14 0639 02/21/14 0041 02/21/14 0436  HGB  --  8.4* 8.5*  --  8.1*  --  7.5*  HCT  --  26.6* 27.1*  --  26.3*  --  23.8*  PLT  --   --   --   --   --   --  353  LABPROT  --  19.7*  --   --  20.6*  --  20.9*  INR  --  1.66*  --   --  1.75*  --  1.79*  HEPARINUNFRC 0.53  --   --  0.37  --   --  0.28*  CREATININE  --   --   --   --   --  0.91 0.86    Estimated Creatinine Clearance: 78.7 mL/min (by C-G formula based on Cr of 0.86).  Assessment: 73 y.o. Male on Coumadin PTA for afib (Home dose 4mg  daily except for 6mg  on Mon and Thur - admit INR 2.51). Coumadin reversed 12/3 with vit K 5mg  SQ for perc drain placement and was held 12/3>12/6 in case further surgery needed. Receivied doses 12/7 & 12/8. Now being held due to NGT placement for ileus and pt NPO-bridging with heparin. Currently on 1150 units/hr, but heparin level fell this morning to below goal at 0.28units/mL. INR 1.79 today- has been rising over the past few days despite not receiving any warfarin. Hgb 7.5- slight drop, plts 353-stable.  No bleeding noted.  Goal of Therapy:  INR 2-3 Monitor platelets by anticoagulation protocol: Yes   Plan:  1. increase heparin gtt to 1250 units/hr.  2. Heparin level in 8 hours 3. Daily heparin level and CBC 4. F/u ability to restart coumadin   Tequita Marrs D. Koichi Platte, PharmD, BCPS Clinical Pharmacist Pager: [phone] 02/21/2014 7:22 AM

## 2014-02-22 LAB — CBC
HEMATOCRIT: 24.1 % — AB (ref 39.0–52.0)
HEMOGLOBIN: 7.5 g/dL — AB (ref 13.0–17.0)
MCH: 27.6 pg (ref 26.0–34.0)
MCHC: 31.1 g/dL (ref 30.0–36.0)
MCV: 88.6 fL (ref 78.0–100.0)
Platelets: 372 10*3/uL (ref 150–400)
RBC: 2.72 MIL/uL — ABNORMAL LOW (ref 4.22–5.81)
RDW: 19.3 % — ABNORMAL HIGH (ref 11.5–15.5)
WBC: 11.4 10*3/uL — ABNORMAL HIGH (ref 4.0–10.5)

## 2014-02-22 LAB — BASIC METABOLIC PANEL
Anion gap: 12 (ref 5–15)
BUN: 6 mg/dL (ref 6–23)
CO2: 32 mEq/L (ref 19–32)
Calcium: 8.8 mg/dL (ref 8.4–10.5)
Chloride: 99 mEq/L (ref 96–112)
Creatinine, Ser: 0.9 mg/dL (ref 0.50–1.35)
GFR, EST NON AFRICAN AMERICAN: 82 mL/min — AB (ref >90)
Glucose, Bld: 78 mg/dL (ref 70–99)
Potassium: 3.6 mEq/L — ABNORMAL LOW (ref 3.7–5.3)
Sodium: 143 mEq/L (ref 137–147)

## 2014-02-22 LAB — HEPARIN LEVEL (UNFRACTIONATED): Heparin Unfractionated: 0.46 IU/mL (ref 0.30–0.70)

## 2014-02-22 LAB — PROTIME-INR
INR: 1.73 — ABNORMAL HIGH (ref 0.00–1.49)
Prothrombin Time: 20.4 seconds — ABNORMAL HIGH (ref 11.6–15.2)

## 2014-02-22 NOTE — Progress Notes (Signed)
Patient ID: Theodore [name], male   DOB: [birth year], 73 y.o.   MRN: [id number]   RLQ abscess drain placed 12/6 Output 5 cc daily- clear serous Wbc 11 CT 12/11 shows resolution afeb  Removed without complication per Dr Excell Seltzer note

## 2014-02-22 NOTE — Progress Notes (Signed)
[place] for Heparin Indication: atrial fibrillation  Allergies  Allergen Reactions  . Aspirin Other (See Comments)    On coumadin    Patient Measurements: Height: 5\' 6"  (167.6 cm) Weight: 151 lb 4.8 oz (68.629 kg) IBW/kg (Calculated) : 63.8  Heparin dosing weight: 83 kg  Vital Signs: Temp: 98.4 F (36.9 C) (12/13 0615) Temp Source: Oral (12/13 0615) BP: 123/74 mmHg (12/13 0615) Pulse Rate: 92 (12/13 0615)  Labs:  Recent Labs  02/20/14 0639 02/21/14 0041 02/21/14 0436 02/21/14 1607 02/22/14 0345  HGB 8.1*  --  7.5*  --  7.5*  HCT 26.3*  --  23.8*  --  24.1*  PLT  --   --  353  --  372  LABPROT 20.6*  --  20.9*  --  20.4*  INR 1.75*  --  1.79*  --  1.73*  HEPARINUNFRC  --   --  0.28* 0.37 0.46  CREATININE  --  0.91 0.86  --  0.90    Estimated Creatinine Clearance: 65 mL/min (by C-G formula based on Cr of 0.9).  Assessment: 73 y.o. Male on Coumadin PTA for afib (Home dose 4mg  daily except for 6mg  on Mon and Thur - admit INR 2.51). Coumadin reversed 12/3 with vit K 5mg  SQ for perc drain placement and was held 12/3>12/6 in case further surgery needed. Receivied doses 12/7 & 12/8. Now being held due to NGT placement for ileus and pt NPO-bridging with heparin. Currently on 1250 units/hr with therapeutic heparin level x2- most recently 0.46 units/mL.  INR 1.73 today- has not dropped over the past few days despite not receiving any warfarin. Hgb 7.5- slight drop, plts 372-stable.  No bleeding noted.  Goal of Therapy:  INR 2-3 Monitor platelets by anticoagulation protocol: Yes   Plan:  1. Continue heparin gtt at 1250 units/hr.  2. Daily heparin level and CBC 3. F/u ability to restart coumadin   Lesleyann Fichter D. Maecyn Panning, PharmD, BCPS Clinical Pharmacist Pager: [phone] 02/22/2014 10:14 AM

## 2014-02-22 NOTE — Progress Notes (Signed)
[facility] PROGRESS NOTE  Theodore [name] SWN:[id number] DOB: [birth year] DOA: 02/10/2014 PCP: Tula Nakayama, MD  Assessment/Plan:  1. Perforated appendix with abscess/sepsis; CT: Findings are concerning for ruptured appendicitis with an associated approximately 4.5 x 3.2 cm abscess  - Cardiology consulted for surgical clearance, currently no plans for further cardiac testing - Pt is s/p drain placement by IR, plan is to remove drain - Pt is npo - General surgery on board. - Will defer nutritional recommendations to general surgery  2. CHF, diastolic HF; echo (9381): LVEF 82%, diastolic dysfunction, pulmonary HTN PASP 74, TR - CT reports moderate right heart failure - new problem continue lasix dose and monitor output closely - saline locked patient  3. COPD on home oxygen supplementation; no wheezing on exam; cont supplemental oxygen, bronchodilators as needed   4. CT: Bilateral lower lobe bronchiectatic changes and consolidation are suspicious for chronic aspiration pneumonitis -Speech therapy evaluation  5. A. fib on Coumadin; HR is stable; coumadin on hold for procedure. INR currently trending down - Coumadin on hold due to NPO status.  - on Heparin  6. AKI on CKD II in the setting of sepsis; likely ATN;  - closely monitor I/O, daily weight; urine output  - Serum creatinine stable  7. Acute anemia - stable with last value at 8.4  Code Status: stable Family Communication: None at bedside Disposition Plan: Pending recommendations from specialist involved   Consultants:  General surgery  IR  Cardiology  Procedures:  pending  Antibiotics:  Vancomycin and zosyn>>>02/17/14  Ampicillin-Sulbactam  HPI/Subjective: Pt has no new complaints. No acute issues reported to me overnight.  Objective: Filed Vitals:   02/22/14 0615  BP: 123/74  Pulse: 92  Temp: 98.4 F (36.9 C)  Resp: 18    Intake/Output Summary (Last 24 hours) at 02/22/14  1403 Last data filed at 02/22/14 0617  Gross per 24 hour  Intake      0 ml  Output   4131 ml  Net  -4131 ml   Filed Weights   02/18/14 0500 02/21/14 0617 02/22/14 0615  Weight: 83.779 kg (184 lb 11.2 oz) 88.905 kg (196 lb) 68.629 kg (151 lb 4.8 oz)    Exam:   General:  Pt in nad, alert and awake  Cardiovascular: s1 and s2 present, no mrg  Respiratory: cta bl, no wheezes  Abdomen: soft, pain on palpation , no guarding  Musculoskeletal: no clubbing or cyanosis   Data Reviewed: Basic Metabolic Panel:  Recent Labs Lab 02/16/14 0755 02/17/14 0103 02/20/14 0639 02/21/14 0041 02/21/14 0436 02/22/14 0345  NA 142 137  --  142 141 143  K 4.0 3.6*  --  4.1 4.1 3.6*  CL 101 99  --  104 104 99  CO2 31 28  --  28 25 32  GLUCOSE 106* 115*  --  102* 98 78  BUN 27* 20  --  8 8 6   CREATININE 1.20 1.03  --  0.91 0.86 0.90  CALCIUM 8.7 8.6  --  8.6 8.7 8.8  MG  --   --  2.0  --   --   --    Liver Function Tests:  Recent Labs Lab 02/16/14 0755  AST 19  ALT 7  ALKPHOS 83  BILITOT 1.5*  PROT 6.2  ALBUMIN 2.2*   No results for input(s): LIPASE, AMYLASE in the last 168 hours. No results for input(s): AMMONIA in the last 168 hours. CBC:  Recent Labs Lab 02/16/14 0755  02/17/14  0103  02/19/14 0750 02/19/14 1916 02/20/14 0639 02/21/14 0436 02/22/14 0345  WBC 12.8*  --  10.6*  --   --   --   --  11.8* 11.4*  HGB 7.5*  < > 7.3*  < > 8.4* 8.5* 8.1* 7.5* 7.5*  HCT 23.6*  < > 22.3*  < > 26.6* 27.1* 26.3* 23.8* 24.1*  MCV 84.6  --  87.1  --   --   --   --  87.5 88.6  PLT 258  --  255  --   --   --   --  353 372  < > = values in this interval not displayed. Cardiac Enzymes: No results for input(s): CKTOTAL, CKMB, CKMBINDEX, TROPONINI in the last 168 hours. BNP (last 3 results)  Recent Labs  06/16/13 1554 06/17/13 0537 07/09/13 0555  PROBNP 3560.0* 3222.0* 3842.0*   CBG: No results for input(s): GLUCAP in the last 168 hours.  No results found for this or any  previous visit (from the past 240 hour(s)).   Studies: No results found.  Scheduled Meds: . allopurinol  300 mg Oral Daily  . ampicillin-sulbactam (UNASYN) IV  3 g Intravenous Q6H  . antiseptic oral rinse  7 mL Mouth Rinse BID  . brimonidine  1 drop Both Eyes Q12H   And  . timolol  1 drop Both Eyes Q12H  . budesonide-formoterol  2 puff Inhalation BID  . furosemide  40 mg Intravenous Daily  . latanoprost  1 drop Both Eyes QHS  . metoprolol  5 mg Intravenous 4 times per day  . sodium chloride  3 mL Intravenous Q12H  . tiotropium  18 mcg Inhalation Daily   Continuous Infusions: . heparin 1,250 Units/hr (02/21/14 1211)     Time spent: > 35 minutes    [name]  [facility] Pager [phone]  If 7PM-7AM, please contact night-coverage at [email or web address], password [facility] 02/22/2014, 2:03 PM  LOS: 12 days

## 2014-02-22 NOTE — Progress Notes (Signed)
Patient ID: Theodore [name], male   DOB: 01/04/1941, 73 y.o.   MRN: [id number]    Subjective: No complaints this morning. No nausea with NG out. Has had a bowel movement. He states he is hungry. Denies pain.  Objective: Vital signs in last 24 hours: Temp:  [98.1 F (36.7 C)-98.6 F (37 C)] 98.4 F (36.9 C) (12/13 0615) Pulse Rate:  [92-116] 92 (12/13 0615) Resp:  [18-22] 18 (12/13 0615) BP: (111-123)/(64-74) 123/74 mmHg (12/13 0615) SpO2:  [94 %-100 %] 97 % (12/13 0840) Weight:  [151 lb 4.8 oz (68.629 kg)] 151 lb 4.8 oz (68.629 kg) (12/13 0615) Last BM Date: 02/20/14  Intake/Output from previous day: 12/12 0701 - 12/13 0700 In: -  Out: 4131 [Urine:4125; Drains:6] Intake/Output this shift:    General appearance: alert, cooperative and no distress GI: normal findings: soft, non-tender and nondistended  Lab Results:   Recent Labs  02/21/14 0436 02/22/14 0345  WBC 11.8* 11.4*  HGB 7.5* 7.5*  HCT 23.8* 24.1*  PLT 353 372   BMET  Recent Labs  02/21/14 0436 02/22/14 0345  NA 141 143  K 4.1 3.6*  CL 104 99  CO2 25 32  GLUCOSE 98 78  BUN 8 6  CREATININE 0.86 0.90  CALCIUM 8.7 8.8     Studies/Results: Ct Abdomen Pelvis W Contrast  02/20/2014   CLINICAL DATA:  Ruptured appendicitis  EXAM: CT ABDOMEN AND PELVIS WITH CONTRAST  TECHNIQUE: Multidetector CT imaging of the abdomen and pelvis was performed using the standard protocol following bolus administration of intravenous contrast.  CONTRAST:  174mL OMNIPAQUE IOHEXOL 300 MG/ML  SOLN  COMPARISON:  02/15/2014  FINDINGS: Lower chest: Moderate cardiac enlargement. Moderate bilateral pleural effusions are identified. Atelectasis is noted within both lower lobes.  Hepatobiliary: Reflux of contrast material from the right side of heart into the hepatic veins identified compatible with passive venous congestion of the liver. No focal liver abnormality noted. Gallbladder is negative. No biliary dilatation.  Pancreas: Appears  normal.  Spleen: Normal.  Adrenals/Urinary Tract: The adrenal glands are both normal. Unremarkable appearance of the kidneys an urinary bladder.  Stomach/Bowel: The stomach appears normal. There is an NG tube within the body of stomach. The small bowel loops are mildly increased in caliber measuring up to 3.7 cm. There is enteric contrast material throughout the small and large bowel loops up to the level of the rectum. Improvement in distal small bowel wall thickening. Right lower quadrant pigtail drainage catheter is identified. There has been resolution of the periappendiceal abscess. No new fluid collections identified.  Vascular/Lymphatic: Calcified atherosclerotic disease involves the abdominal aorta. No aneurysm. No enlarged retroperitoneal or mesenteric adenopathy. No enlarged pelvic or inguinal lymph nodes.  Reproductive: Prostate gland and seminal vesicles appear normal.  Other: Moderate abdominal and pelvic ascites identified.  Musculoskeletal: Multi level degenerative disc disease noted throughout the lower lumbar spine. No aggressive lytic or sclerotic bone lesions identified. There are bilateral L5 pars defects noted.  IMPRESSION: 1. Resolution of right lower quadrant abscess status post drainage placement. 2. Moderate abdominal and pelvic ascites. There is also moderate bilateral pleural effusions and reflux of contrast material from the right atrium into the hepatic veins. Findings are concerning for moderate right heart failure. 3. Suspect continued small bowel enteritis with mild increase caliber of small bowel loops without evidence for bowel obstruction.   Electronically Signed   By: Kerby Moors M.D.   On: 02/20/2014 14:10    Anti-infectives: Anti-infectives  Start     Dose/Rate Route Frequency Ordered Stop   02/17/14 1200  Ampicillin-Sulbactam (UNASYN) 3 g in sodium chloride 0.9 % 100 mL IVPB     3 g100 mL/hr over 60 Minutes Intravenous Every 6 hours 02/17/14 1110     02/12/14 2000   piperacillin-tazobactam (ZOSYN) IVPB 3.375 g  Status:  Discontinued     3.375 g12.5 mL/hr over 240 Minutes Intravenous Every 8 hours 02/12/14 1753 02/17/14 1042   02/12/14 2000  vancomycin (VANCOCIN) IVPB 1000 mg/200 mL premix  Status:  Discontinued     1,000 mg200 mL/hr over 60 Minutes Intravenous Every 24 hours 02/12/14 1753 02/17/14 1042   02/11/14 1700  vancomycin (VANCOCIN) IVPB 1000 mg/200 mL premix  Status:  Discontinued     1,000 mg200 mL/hr over 60 Minutes Intravenous Every 24 hours 02/10/14 1607 02/12/14 1753   02/10/14 2200  piperacillin-tazobactam (ZOSYN) IVPB 3.375 g  Status:  Discontinued     3.375 g12.5 mL/hr over 240 Minutes Intravenous Every 8 hours 02/10/14 1602 02/12/14 1753   02/10/14 1700  vancomycin (VANCOCIN) 1,250 mg in sodium chloride 0.9 % 250 mL IVPB     1,250 mg166.7 mL/hr over 90 Minutes Intravenous  Once 02/10/14 1605 02/10/14 1934   02/10/14 1345  piperacillin-tazobactam (ZOSYN) IVPB 3.375 g     3.375 g100 mL/hr over 30 Minutes Intravenous  Once 02/10/14 1344 02/10/14 1452      Assessment/Plan: Perforated appendicitis, status post percutaneous drain, removed this morning. Small bowel obstruction, resolved White blood count improving Continue antibiotics today. Start clear liquid diet    LOS: 12 days    Pearlie Nies T 02/22/2014

## 2014-02-23 LAB — CBC
HEMATOCRIT: 23.2 % — AB (ref 39.0–52.0)
Hemoglobin: 7.3 g/dL — ABNORMAL LOW (ref 13.0–17.0)
MCH: 27.9 pg (ref 26.0–34.0)
MCHC: 31.5 g/dL (ref 30.0–36.0)
MCV: 88.5 fL (ref 78.0–100.0)
Platelets: 375 10*3/uL (ref 150–400)
RBC: 2.62 MIL/uL — AB (ref 4.22–5.81)
RDW: 19.3 % — ABNORMAL HIGH (ref 11.5–15.5)
WBC: 8.4 10*3/uL (ref 4.0–10.5)

## 2014-02-23 LAB — PROTIME-INR
INR: 1.65 — ABNORMAL HIGH (ref 0.00–1.49)
Prothrombin Time: 19.6 seconds — ABNORMAL HIGH (ref 11.6–15.2)

## 2014-02-23 LAB — HEPARIN LEVEL (UNFRACTIONATED): Heparin Unfractionated: 0.33 IU/mL (ref 0.30–0.70)

## 2014-02-23 MED ORDER — WARFARIN SODIUM 4 MG PO TABS
4.0000 mg | ORAL_TABLET | Freq: Once | ORAL | Status: AC
  Administered 2014-02-23: 4 mg via ORAL
  Filled 2014-02-23: qty 1

## 2014-02-23 MED ORDER — WARFARIN - PHARMACIST DOSING INPATIENT
Freq: Every day | Status: DC
  Administered 2014-02-23 – 2014-02-24 (×2)

## 2014-02-23 NOTE — Progress Notes (Signed)
[facility] PROGRESS NOTE  Prabhav Faulkenberry Ess BPZ:[id number] DOB: 1940/07/16 DOA: 02/10/2014 PCP: Tula Nakayama, MD  Assessment/Plan:  1. Perforated appendix with abscess/sepsis; CT: Findings are concerning for ruptured appendicitis with an associated approximately 4.5 x 3.2 cm abscess  - Cardiology consulted for surgical clearance, currently no plans for further cardiac testing - Pt is s/p drain placement by IR, plan is to remove drain - Pt is npo - General surgery on board. -Diet advance to full liquid  2. CHF, diastolic HF; echo (2423): LVEF 53%, diastolic dysfunction, pulmonary HTN PASP 74, TR - CT reports moderate right heart failure - new problem continue lasix dose and monitor output closely - Continue 40 mg of Lasix IV daily  3. COPD on home oxygen supplementation; no wheezing on exam; cont supplemental oxygen, bronchodilators as needed   4. CT: Bilateral lower lobe bronchiectatic changes and consolidation are suspicious for chronic aspiration pneumonitis -Speech therapy evaluation  5. A. fib on Coumadin; HR is stable; coumadin on hold for procedure. INR currently trending down - Coumadin restarted - on Heparin  6. AKI on CKD II in the setting of sepsis; likely ATN;  - closely monitor I/O, daily weight; urine output  - Serum creatinine evaluation next am.  7. Acute anemia - stable with last value at 8.4  Code Status: stable Family Communication: None at bedside Disposition Plan: Pending recommendations from specialist involved   Consultants:  General surgery  IR  Cardiology  Procedures:  pending  Antibiotics:  Vancomycin and zosyn>>>02/17/14  Ampicillin-Sulbactam  HPI/Subjective: Pt has no new complaints. No acute issues reported to me overnight. Asking when he will be able to go home  Objective: Filed Vitals:   02/23/14 1406  BP: 126/83  Pulse: 98  Temp: 98.6 F (37 C)  Resp: 18    Intake/Output Summary (Last 24 hours) at  02/23/14 1629 Last data filed at 02/23/14 1616  Gross per 24 hour  Intake      0 ml  Output   2600 ml  Net  -2600 ml   Filed Weights   02/21/14 0617 02/22/14 0615 02/23/14 0618  Weight: 88.905 kg (196 lb) 68.629 kg (151 lb 4.8 oz) 66.86 kg (147 lb 6.4 oz)    Exam:   General:  Pt in nad, alert and awake  Cardiovascular: s1 and s2 present, no mrg  Respiratory: cta bl, no wheezes  Abdomen: soft, pain on palpation , no guarding  Musculoskeletal: no clubbing or cyanosis   Data Reviewed: Basic Metabolic Panel:  Recent Labs Lab 02/17/14 0103 02/20/14 0639 02/21/14 0041 02/21/14 0436 02/22/14 0345  NA 137  --  142 141 143  K 3.6*  --  4.1 4.1 3.6*  CL 99  --  104 104 99  CO2 28  --  28 25 32  GLUCOSE 115*  --  102* 98 78  BUN 20  --  8 8 6   CREATININE 1.03  --  0.91 0.86 0.90  CALCIUM 8.6  --  8.6 8.7 8.8  MG  --  2.0  --   --   --    Liver Function Tests: No results for input(s): AST, ALT, ALKPHOS, BILITOT, PROT, ALBUMIN in the last 168 hours. No results for input(s): LIPASE, AMYLASE in the last 168 hours. No results for input(s): AMMONIA in the last 168 hours. CBC:  Recent Labs Lab 02/17/14 0103  02/19/14 1916 02/20/14 0639 02/21/14 0436 02/22/14 0345 02/23/14 0525  WBC 10.6*  --   --   --  11.8* 11.4* 8.4  HGB 7.3*  < > 8.5* 8.1* 7.5* 7.5* 7.3*  HCT 22.3*  < > 27.1* 26.3* 23.8* 24.1* 23.2*  MCV 87.1  --   --   --  87.5 88.6 88.5  PLT 255  --   --   --  353 372 375  < > = values in this interval not displayed. Cardiac Enzymes: No results for input(s): CKTOTAL, CKMB, CKMBINDEX, TROPONINI in the last 168 hours. BNP (last 3 results)  Recent Labs  06/16/13 1554 06/17/13 0537 07/09/13 0555  PROBNP 3560.0* 3222.0* 3842.0*   CBG: No results for input(s): GLUCAP in the last 168 hours.  No results found for this or any previous visit (from the past 240 hour(s)).   Studies: No results found.  Scheduled Meds: . allopurinol  300 mg Oral Daily  .  ampicillin-sulbactam (UNASYN) IV  3 g Intravenous Q6H  . antiseptic oral rinse  7 mL Mouth Rinse BID  . brimonidine  1 drop Both Eyes Q12H   And  . timolol  1 drop Both Eyes Q12H  . budesonide-formoterol  2 puff Inhalation BID  . furosemide  40 mg Intravenous Daily  . latanoprost  1 drop Both Eyes QHS  . metoprolol  5 mg Intravenous 4 times per day  . sodium chloride  3 mL Intravenous Q12H  . tiotropium  18 mcg Inhalation Daily  . warfarin  4 mg Oral ONCE-1800  . Warfarin - Pharmacist Dosing Inpatient   Does not apply q1800   Continuous Infusions: . heparin 1,250 Units/hr (02/23/14 0548)     Time spent: > 35 minutes    [name]  [facility] Pager [phone]  If 7PM-7AM, please contact night-coverage at [email or web address], password [facility] 02/23/2014, 4:29 PM  LOS: 13 days

## 2014-02-23 NOTE — Progress Notes (Signed)
ANTICOAGULATION & ANTIBIOTIC CONSULT NOTE - Follow-Up  Pharmacy Consult for Heparin & Unasyn Indication: atrial fibrillation & bacteroides bacteremia in setting of a perforated appendix with a bscess  Allergies  Allergen Reactions  . Aspirin Other (See Comments)    On coumadin    Patient Measurements: Height: 5\' 6"  (167.6 cm) Weight: 147 lb 6.4 oz (66.86 kg) IBW/kg (Calculated) : 63.8  Heparin dosing weight: 83 kg  Vital Signs: Temp: 98.3 F (36.8 C) (12/14 0618) BP: 127/77 mmHg (12/14 0618) Pulse Rate: 72 (12/14 0618)  Labs:  Recent Labs  02/21/14 0041  02/21/14 0436 02/21/14 1607 02/22/14 0345 02/23/14 0525  HGB  --   < > 7.5*  --  7.5* 7.3*  HCT  --   --  23.8*  --  24.1* 23.2*  PLT  --   --  353  --  372 375  LABPROT  --   --  20.9*  --  20.4* 19.6*  INR  --   --  1.79*  --  1.73* 1.65*  HEPARINUNFRC  --   < > 0.28* 0.37 0.46 0.33  CREATININE 0.91  --  0.86  --  0.90  --   < > = values in this interval not displayed.  Estimated Creatinine Clearance: 65 mL/min (by C-G formula based on Cr of 0.9).  Assessment: 73 YOM on warfarin PTA for hx Afib (Home dose 4mg  daily except for 6mg  on Mon and Thur - admit INR 2.51). Coumadin reversed 12/3 with vit K 5 mg SQ for perc drain placement. Warfarin doses held 12/3-12/6 in case further surgery needed. Receivied doses 12/7 & 12/8 and then held again for  Now NGT placement with ileus and NPO. Heparin bridge started on 12/9. The patient was started on clear liquids on 12/13, no plans for surgery at this time, to resume warfarin today.  The patient's heparin level this morning is therapeutic (HL 0.33 << 0.46, goal of 0.3-0.7). The patient's INR remains fairly high despite no doses of warfarin since 12/8 (INR 1.65). Due to this - will start with a lower dose and watch INR trends closely. Will obtain LFTs on 12/15 to monitor liver function (wnl on 12/7). Hgb 7.3, plts 375. No overt s/sx of bleeding noted.  The patient also continues  on Unasyn for bacteroides bacteremia in the setting of a perforated appendix with abscess. Today is total antibiotic D#14, imaging shows abscess resolved, drain now removed. The patient is afebrile, WBC wnl - could consider stopping antibiotics as 14 days should be a sufficient treatment duration.  Goal of Therapy:  INR 2-3 Heparin level 0.3-0.7 units/ml Monitor platelets by anticoagulation protocol: Yes  Proper antibiotics for infection/cultures adjusted for renal/hepatic function    Plan:  1. Continue Heparin at the current rate of 1250 units/hr (12.5 ml/hr) 2. Warfarin 4 mg x 1 dose at 1800 today 3. Continue Unasyn 3g IV every 6 hours (consider stopping after today's doses) 4. Will continue to monitor for any signs/symptoms of bleeding and will follow up with heparin level and PT/INR in the a.m 5. Will continue to follow renal function, culture results, LOT, and antibiotic de-escalation plans   Alycia Rossetti, PharmD, BCPS Clinical Pharmacist Pager: [phone] 02/23/2014 10:00 AM

## 2014-02-23 NOTE — Progress Notes (Addendum)
Subjective: Alert. No distress. Says he's having bowel movements. Tolerating clear liquids. Says he is hungry. Denies nausea.  still has Foley catheter. Right lower quadrant drain removed yesterday Remains on IV Zosyn per pharmacy consult.  Objective: Vital signs in last 24 hours: Temp:  [97.6 F (36.4 C)-98.3 F (36.8 C)] 98.3 F (36.8 C) (12/14 0618) Pulse Rate:  [72-101] 72 (12/14 0618) Resp:  [14-20] 17 (12/14 0618) BP: (120-127)/(68-77) 127/77 mmHg (12/14 0618) SpO2:  [97 %-100 %] 100 % (12/14 0618) Weight:  [147 lb 6.4 oz (66.86 kg)] 147 lb 6.4 oz (66.86 kg) (12/14 0618) Last BM Date: 02/22/14 (twice per pt)  Intake/Output from previous day: 12/13 0701 - 12/14 0700 In: -  Out: 1950 [Urine:1950] Intake/Output this shift:    General appearance: Alert. Cooperative. Appropriate. Oxygen in place. Does not appear in distress. Frail. Resp: Some rhonchi bilaterally. Moves air reasonably well. GI: Abdomen soft. Nontender. Borderline distended. Hypoactive bowel sounds. Benign.  Lab Results:   Recent Labs  02/22/14 0345 02/23/14 0525  WBC 11.4* 8.4  HGB 7.5* 7.3*  HCT 24.1* 23.2*  PLT 372 375   BMET  Recent Labs  02/21/14 0436 02/22/14 0345  NA 141 143  K 4.1 3.6*  CL 104 99  CO2 25 32  GLUCOSE 98 78  BUN 8 6  CREATININE 0.86 0.90  CALCIUM 8.7 8.8   PT/INR  Recent Labs  02/22/14 0345 02/23/14 0525  LABPROT 20.4* 19.6*  INR 1.73* 1.65*   ABG No results for input(s): PHART, HCO3 in the last 72 hours.  Invalid input(s): PCO2, PO2  Studies/Results: No results found.  Anti-infectives: Anti-infectives    Start     Dose/Rate Route Frequency Ordered Stop   02/17/14 1200  Ampicillin-Sulbactam (UNASYN) 3 g in sodium chloride 0.9 % 100 mL IVPB     3 g100 mL/hr over 60 Minutes Intravenous Every 6 hours 02/17/14 1110     02/12/14 2000  piperacillin-tazobactam (ZOSYN) IVPB 3.375 g  Status:  Discontinued     3.375 g12.5 mL/hr over 240 Minutes Intravenous  Every 8 hours 02/12/14 1753 02/17/14 1042   02/12/14 2000  vancomycin (VANCOCIN) IVPB 1000 mg/200 mL premix  Status:  Discontinued     1,000 mg200 mL/hr over 60 Minutes Intravenous Every 24 hours 02/12/14 1753 02/17/14 1042   02/11/14 1700  vancomycin (VANCOCIN) IVPB 1000 mg/200 mL premix  Status:  Discontinued     1,000 mg200 mL/hr over 60 Minutes Intravenous Every 24 hours 02/10/14 1607 02/12/14 1753   02/10/14 2200  piperacillin-tazobactam (ZOSYN) IVPB 3.375 g  Status:  Discontinued     3.375 g12.5 mL/hr over 240 Minutes Intravenous Every 8 hours 02/10/14 1602 02/12/14 1753   02/10/14 1700  vancomycin (VANCOCIN) 1,250 mg in sodium chloride 0.9 % 250 mL IVPB     1,250 mg166.7 mL/hr over 90 Minutes Intravenous  Once 02/10/14 1605 02/10/14 1934   02/10/14 1345  piperacillin-tazobactam (ZOSYN) IVPB 3.375 g     3.375 g100 mL/hr over 30 Minutes Intravenous  Once 02/10/14 1344 02/10/14 1452      Assessment/Plan:  Perforated appendicitis with abscess.  abscess has resolved and drain removed yesterday. WBC 8400. Continue IV Zosyn No plans for surgery at this point SBO seems to be resolving Frail  Congestive heart failure, diastolic.  COPD on home oxygen chronically   A. fib on Coumadin. INR 1.65.  AKI superimposed on CKI Likely ATN. Creatinine stable at 0.9, BUN 6. Discontinue Foley catheter  Appreciate management of medical  problems by [facility].  Anemia. Chronic disease. Tolerating.   LOS: 13 days    Amelya Mabry M 02/23/2014

## 2014-02-23 NOTE — Progress Notes (Signed)
Physical Therapy Treatment Patient Details Name: Theodore [name] MRN: [id number] DOB: [birth year] Today's Date: 02/23/2014    History of Present Illness Patient is a 73 y/o male presents with acute RLQ pain due to acute perforated appendicitis with abscess, s/p perc drain on 02-15-14. PMH of A-fib, diastolic heart failure, pulmonary HTN, COPD, anxiety, HTN and anemia. NG tube to suction. CT abdomen-Moderate abdominal and pelvic ascites. Bil pleural effusions and reflux of contrast material from the right atrium into the hepatic veins concering for right sided heart failure.     PT Comments    Pt making slow progress with mobility and continues to require min A without AD.  Recommend pt use either RW or cane at home for increased safety and to prevent falls.  Pt seemed very reluctant to using, but therapist assuring pt that it would not be long term, but initially when home to prevent falls.  Pt more agreeable to using device for gait.    Follow Up Recommendations  Home health PT;Supervision/Assistance - 24 hour     Equipment Recommendations  Cane    Recommendations for Other Services       Precautions / Restrictions Precautions Precautions: Fall Precaution Comments: monitor O2, HR Restrictions Weight Bearing Restrictions: No    Mobility  Bed Mobility Overal bed mobility:  (Pt lying half in/half out of bed, but sat up S. )                Transfers Overall transfer level: Needs assistance Equipment used: None Transfers: Sit to/from [facility] Sit to Stand: Supervision Stand pivot transfers: Supervision       General transfer comment: Pt requesting to get to bedside commode prior to leaving room.  Pt able to stand without AD and transfer to bedside commode at S level.  While on bedside commode, RN in room to remove foley and pt also became nauseated.  RN provided nausea meds and removed foley prior to leaving room.  Pt with liquid stools while on bedside  commode.  RN aware.   Ambulation/Gait Ambulation/Gait assistance: Min assist Ambulation Distance (Feet): 100 Feet (with standing rest break in between. ) Assistive device: None (intermittent use of rail) Gait Pattern/deviations: Step-through pattern;Decreased stride length;Staggering right;Staggering left;Narrow base of support Gait velocity: decreased   General Gait Details: Pt continued to require single standing rest break during gait.  PT's pulse ox unable to get reading, however pt reports breathing feels "okay" on 2L O2.  RN states that HR was increased with instances of PVCs during gait.  She is to provide meds as they had been held.    Stairs            Wheelchair Mobility    Modified Rankin (Stroke Patients Only)       Balance Overall balance assessment: Needs assistance Sitting-balance support: Feet supported;Single extremity supported Sitting balance-Leahy Scale: Good     Standing balance support: During functional activity Standing balance-Leahy Scale: Poor Standing balance comment: Requires min A from therapist without AD to maintain standing.                     Cognition Arousal/Alertness: Awake/alert Behavior During Therapy: WFL for tasks assessed/performed Overall Cognitive Status: Within Functional Limits for tasks assessed                      Exercises      General Comments        Pertinent Vitals/Pain Pain  Assessment: No/denies pain Pain Score: 0-No pain    Home Living                      Prior Function            PT Goals (current goals can now be found in the care plan section) Acute Rehab PT Goals Patient Stated Goal: to get home PT Goal Formulation: With patient Time For Goal Achievement: 03/02/14 Potential to Achieve Goals: Good Progress towards PT goals: Progressing toward goals    Frequency  Min 4X/week    PT Plan Current plan remains appropriate    Co-evaluation             End of  Session Equipment Utilized During Treatment: Oxygen Activity Tolerance: Patient tolerated treatment well Patient left: in chair;with call bell/phone within reach;with nursing/sitter in room     Time: 1036-1105 PT Time Calculation (min) (ACUTE ONLY): 29 min  Charges:  $Gait Training: 8-22 mins $Therapeutic Activity: 8-22 mins                    G Codes:      Denice Bors 02/23/2014, 11:14 AM

## 2014-02-23 NOTE — Progress Notes (Signed)
RN notified by central tele that pt's HR staying in the 140s and pt having multiple PVCs.  RN went to pt's room and found that he was working with PT.  At that moment, he was sitting on the [facility] and having some dry heaving.  Pt denied any type of chest pain.  Paged MD to notify him of the telemetry alert.  When pt was finished working with PT and sitting in the chair, he did say that he had a "pain in his chest but it was gone now and that he does that when he does activity".  Gave pt his scheduled Metoprolol, which had been held for the last 2 doses during the night because of BP parameters.  Notified rapid response RN just to double check his telemetry and to check the patient with me.  Telemetry looked better now and HR back down to 99.  Pt has had no other complaints of chest pain.  Will cont to monitor.

## 2014-02-24 DIAGNOSIS — E876 Hypokalemia: Secondary | ICD-10-CM

## 2014-02-24 LAB — PROTIME-INR
INR: 1.61 — ABNORMAL HIGH (ref 0.00–1.49)
Prothrombin Time: 19.3 seconds — ABNORMAL HIGH (ref 11.6–15.2)

## 2014-02-24 LAB — CBC
HEMATOCRIT: 23.1 % — AB (ref 39.0–52.0)
Hemoglobin: 7.2 g/dL — ABNORMAL LOW (ref 13.0–17.0)
MCH: 27.6 pg (ref 26.0–34.0)
MCHC: 31.2 g/dL (ref 30.0–36.0)
MCV: 88.5 fL (ref 78.0–100.0)
PLATELETS: 354 10*3/uL (ref 150–400)
RBC: 2.61 MIL/uL — AB (ref 4.22–5.81)
RDW: 19.3 % — ABNORMAL HIGH (ref 11.5–15.5)
WBC: 6.3 10*3/uL (ref 4.0–10.5)

## 2014-02-24 LAB — BASIC METABOLIC PANEL
Anion gap: 10 (ref 5–15)
BUN: 4 mg/dL — ABNORMAL LOW (ref 6–23)
CALCIUM: 8.2 mg/dL — AB (ref 8.4–10.5)
CO2: 33 meq/L — AB (ref 19–32)
CREATININE: 0.93 mg/dL (ref 0.50–1.35)
Chloride: 99 mEq/L (ref 96–112)
GFR calc Af Amer: >90 mL/min (ref >90)
GFR calc non Af Amer: 81 mL/min — ABNORMAL LOW (ref >90)
GLUCOSE: 86 mg/dL (ref 70–99)
Potassium: 3.1 mEq/L — ABNORMAL LOW (ref 3.7–5.3)
Sodium: 142 mEq/L (ref 137–147)

## 2014-02-24 LAB — ALT: ALT: 6 U/L (ref 0–53)

## 2014-02-24 LAB — HEPARIN LEVEL (UNFRACTIONATED): HEPARIN UNFRACTIONATED: 0.5 [IU]/mL (ref 0.30–0.70)

## 2014-02-24 LAB — AST: AST: 15 U/L (ref 0–37)

## 2014-02-24 MED ORDER — ENSURE COMPLETE PO LIQD
237.0000 mL | Freq: Two times a day (BID) | ORAL | Status: DC
  Administered 2014-02-24 – 2014-02-25 (×3): 237 mL via ORAL

## 2014-02-24 MED ORDER — FERROUS SULFATE 325 (65 FE) MG PO TABS
325.0000 mg | ORAL_TABLET | Freq: Two times a day (BID) | ORAL | Status: DC
  Administered 2014-02-24 – 2014-02-26 (×4): 325 mg via ORAL
  Filled 2014-02-24 (×6): qty 1

## 2014-02-24 MED ORDER — POTASSIUM CHLORIDE CRYS ER 20 MEQ PO TBCR
40.0000 meq | EXTENDED_RELEASE_TABLET | Freq: Once | ORAL | Status: AC
  Administered 2014-02-24: 40 meq via ORAL
  Filled 2014-02-24: qty 2

## 2014-02-24 MED ORDER — FUROSEMIDE 40 MG PO TABS
40.0000 mg | ORAL_TABLET | Freq: Every day | ORAL | Status: DC
  Administered 2014-02-25: 40 mg via ORAL
  Filled 2014-02-24: qty 1

## 2014-02-24 MED ORDER — WARFARIN SODIUM 4 MG PO TABS
4.0000 mg | ORAL_TABLET | Freq: Once | ORAL | Status: AC
  Administered 2014-02-24: 4 mg via ORAL
  Filled 2014-02-24: qty 1

## 2014-02-24 NOTE — Progress Notes (Signed)
[facility] PROGRESS NOTE  Theodore [name] PIR:[id number] DOB: 07-Jun-1940 DOA: 02/10/2014 PCP: Theodore Nakayama, MD  Brief narrative Patient is a 73 year old with past medical history of A. fib on Coumadin, diastolic heart failure, COPD, hypertension, anemia who presented to the ED complaining of abdominal pain CT of abdomen pelvis initially reported ruptured appendicitis with associated 4.5-3.2 cm abscess and enteritis.  Patient was transitioned to [facility] and Gen. surgery consulted. Interventional radiology consulted and placed drain. Abscess resolved after drain placement and drained successfully removed.  Assessment/Plan:  1. Perforated appendix with abscess/sepsis; CT: Findings are concerning for ruptured appendicitis with an associated approximately 4.5 x 3.2 cm abscess  - Cardiology consulted for surgical clearance, currently no plans for further cardiac testing - General surgery on board. -Diet advancement per General surgery: Nursing reports poor oral intake today  2. CHF, diastolic HF; echo (6301): LVEF 60%, diastolic dysfunction, pulmonary HTN PASP 74, TR - CT reports moderate right heart failure - Continue 40 mg of lasix but will change to oral regimen  3. COPD on home oxygen supplementation; no wheezing on exam; cont supplemental oxygen, bronchodilators as needed   4. CT: Bilateral lower lobe bronchiectatic changes and consolidation are suspicious for chronic aspiration pneumonitis -Speech therapy evaluation  5. A. fib on Coumadin; HR is stable; coumadin on hold for procedure. INR currently trending down - Coumadin restarted - on Heparin until INR therapeutic  6. AKI on CKD II in the setting of sepsis; likely ATN;  - resolved  7. Acute anemia - stable - add ferrous sulfate   8. Hypokalemia - Will replace orally and reassess  Code Status: stable Family Communication: None at bedside Disposition Plan: Pending recommendations from specialist  involved   Consultants:  General surgery  IR  Cardiology  Procedures:  pending  Antibiotics:  Vancomycin and zosyn>>>02/17/14  Ampicillin-Sulbactam  HPI/Subjective: Pt has no new complaints. No acute issues reported to me overnight.   Objective: Filed Vitals:   02/24/14 1420  BP: 107/60  Pulse: 53  Temp: 98.3 F (36.8 C)  Resp: 19    Intake/Output Summary (Last 24 hours) at 02/24/14 1552 Last data filed at 02/24/14 1400  Gross per 24 hour  Intake 693.13 ml  Output   1100 ml  Net -406.87 ml   Filed Weights   02/22/14 0615 02/23/14 0618 02/24/14 0500  Weight: 68.629 kg (151 lb 4.8 oz) 66.86 kg (147 lb 6.4 oz) 66.134 kg (145 lb 12.8 oz)    Exam:   General:  Pt in nad, alert and awake  Cardiovascular: No cyanosis  Respiratory: No increased wob, no wheezes  Abdomen: soft, NT, no guarding  Musculoskeletal: no clubbing or cyanosis   Data Reviewed: Basic Metabolic Panel:  Recent Labs Lab 02/20/14 0639 02/21/14 0041 02/21/14 0436 02/22/14 0345 02/24/14 0431  NA  --  142 141 143 142  K  --  4.1 4.1 3.6* 3.1*  CL  --  104 104 99 99  CO2  --  28 25 32 33*  GLUCOSE  --  102* 98 78 86  BUN  --  8 8 6  4*  CREATININE  --  0.91 0.86 0.90 0.93  CALCIUM  --  8.6 8.7 8.8 8.2*  MG 2.0  --   --   --   --    Liver Function Tests:  Recent Labs Lab 02/24/14 0431  AST 15  ALT 6   No results for input(s): LIPASE, AMYLASE in the last 168 hours. No results  for input(s): AMMONIA in the last 168 hours. CBC:  Recent Labs Lab 02/20/14 0639 02/21/14 0436 02/22/14 0345 02/23/14 0525 02/24/14 0431  WBC  --  11.8* 11.4* 8.4 6.3  HGB 8.1* 7.5* 7.5* 7.3* 7.2*  HCT 26.3* 23.8* 24.1* 23.2* 23.1*  MCV  --  87.5 88.6 88.5 88.5  PLT  --  353 372 375 354   Cardiac Enzymes: No results for input(s): CKTOTAL, CKMB, CKMBINDEX, TROPONINI in the last 168 hours. BNP (last 3 results)  Recent Labs  06/16/13 1554 06/17/13 0537 07/09/13 0555  PROBNP 3560.0*  3222.0* 3842.0*   CBG: No results for input(s): GLUCAP in the last 168 hours.  No results found for this or any previous visit (from the past 240 hour(s)).   Studies: No results found.  Scheduled Meds: . allopurinol  300 mg Oral Daily  . ampicillin-sulbactam (UNASYN) IV  3 g Intravenous Q6H  . antiseptic oral rinse  7 mL Mouth Rinse BID  . brimonidine  1 drop Both Eyes Q12H   And  . timolol  1 drop Both Eyes Q12H  . budesonide-formoterol  2 puff Inhalation BID  . feeding supplement (ENSURE COMPLETE)  237 mL Oral BID BM  . furosemide  40 mg Intravenous Daily  . latanoprost  1 drop Both Eyes QHS  . metoprolol  5 mg Intravenous 4 times per day  . sodium chloride  3 mL Intravenous Q12H  . tiotropium  18 mcg Inhalation Daily  . warfarin  4 mg Oral ONCE-1800  . Warfarin - Pharmacist Dosing Inpatient   Does not apply q1800   Continuous Infusions: . heparin 1,250 Units/hr (02/24/14 0321)     Time spent: > 35 minutes    [name]  [facility] Pager [phone]  If 7PM-7AM, please contact night-coverage at [email or web address], password [facility] 02/24/2014, 3:52 PM  LOS: 14 days

## 2014-02-24 NOTE — Progress Notes (Signed)
Patient ID: Theodore [name], male   DOB: 02-Apr-1940, 73 y.o.   MRN: [id number]    Subjective: Pt doing well.  Wants to go home.  Tolerating full liquids and having BMs  Objective: Vital signs in last 24 hours: Temp:  [98.6 F (37 C)-98.8 F (37.1 C)] 98.8 F (37.1 C) (12/15 0557) Pulse Rate:  [66-140] 66 (12/15 0557) Resp:  [16-18] 16 (12/15 0557) BP: (113-126)/(62-83) 120/75 mmHg (12/15 0557) SpO2:  [85 %-100 %] 93 % (12/15 1014) Weight:  [145 lb 12.8 oz (66.134 kg)] 145 lb 12.8 oz (66.134 kg) (12/15 0500) Last BM Date: 02/23/14  Intake/Output from previous day: 12/14 0701 - 12/15 0700 In: 593.1 [I.V.:293.1; IV Piggyback:300] Out: 800 [Urine:800] Intake/Output this shift:    PE: Abd: soft, NT, ND, +BS  Lab Results:   Recent Labs  02/23/14 0525 02/24/14 0431  WBC 8.4 6.3  HGB 7.3* 7.2*  HCT 23.2* 23.1*  PLT 375 354   BMET  Recent Labs  02/22/14 0345 02/24/14 0431  NA 143 142  K 3.6* 3.1*  CL 99 99  CO2 32 33*  GLUCOSE 78 86  BUN 6 4*  CREATININE 0.90 0.93  CALCIUM 8.8 8.2*   PT/INR  Recent Labs  02/23/14 0525 02/24/14 0431  LABPROT 19.6* 19.3*  INR 1.65* 1.61*   CMP     Component Value Date/Time   NA 142 02/24/2014 0431   K 3.1* 02/24/2014 0431   CL 99 02/24/2014 0431   CO2 33* 02/24/2014 0431   GLUCOSE 86 02/24/2014 0431   BUN 4* 02/24/2014 0431   CREATININE 0.93 02/24/2014 0431   CREATININE 1.07 09/29/2013 1132   CALCIUM 8.2* 02/24/2014 0431   PROT 6.2 02/16/2014 0755   ALBUMIN 2.2* 02/16/2014 0755   AST 15 02/24/2014 0431   ALT 6 02/24/2014 0431   ALKPHOS 83 02/16/2014 0755   BILITOT 1.5* 02/16/2014 0755   GFRNONAA 81* 02/24/2014 0431   GFRNONAA 68 09/29/2013 1132   GFRAA >90 02/24/2014 0431   GFRAA 79 09/29/2013 1132   Lipase     Component Value Date/Time   LIPASE 9* 02/10/2014 1106       Studies/Results: No results found.  Anti-infectives: Anti-infectives    Start     Dose/Rate Route Frequency Ordered Stop   02/17/14 1200  Ampicillin-Sulbactam (UNASYN) 3 g in sodium chloride 0.9 % 100 mL IVPB     3 g100 mL/hr over 60 Minutes Intravenous Every 6 hours 02/17/14 1110     02/12/14 2000  piperacillin-tazobactam (ZOSYN) IVPB 3.375 g  Status:  Discontinued     3.375 g12.5 mL/hr over 240 Minutes Intravenous Every 8 hours 02/12/14 1753 02/17/14 1042   02/12/14 2000  vancomycin (VANCOCIN) IVPB 1000 mg/200 mL premix  Status:  Discontinued     1,000 mg200 mL/hr over 60 Minutes Intravenous Every 24 hours 02/12/14 1753 02/17/14 1042   02/11/14 1700  vancomycin (VANCOCIN) IVPB 1000 mg/200 mL premix  Status:  Discontinued     1,000 mg200 mL/hr over 60 Minutes Intravenous Every 24 hours 02/10/14 1607 02/12/14 1753   02/10/14 2200  piperacillin-tazobactam (ZOSYN) IVPB 3.375 g  Status:  Discontinued     3.375 g12.5 mL/hr over 240 Minutes Intravenous Every 8 hours 02/10/14 1602 02/12/14 1753   02/10/14 1700  vancomycin (VANCOCIN) 1,250 mg in sodium chloride 0.9 % 250 mL IVPB     1,250 mg166.7 mL/hr over 90 Minutes Intravenous  Once 02/10/14 1605 02/10/14 1934   02/10/14 1345  piperacillin-tazobactam (ZOSYN)  IVPB 3.375 g     3.375 g100 mL/hr over 30 Minutes Intravenous  Once 02/10/14 1344 02/10/14 1452       Assessment/Plan  1. Acute perforated appendicitis with abscess, s/p perc drain on 02-15-14, now removed 2. COPD 3. Chronic A fib 4. Anemia 5. Ileus, resolved  Plan: 1. Advance to soft diet today.  If he tolerates this he may dc home from our standpoint 2. He will need to follow up with Dr. Arnoldo Morale if he prefers to see someone closer to where he lives vs following up with [place] at [place].  He may need an interval appendectomy.  LOS: 14 days    Rainee Sweatt E 02/24/2014, 10:22 AM Pager: [phone]

## 2014-02-24 NOTE — Progress Notes (Signed)
ANTICOAGULATION CONSULT NOTE - Follow-Up  Pharmacy Consult for Heparin + Warfarin Indication: atrial fibrillation   Allergies  Allergen Reactions  . Aspirin Other (See Comments)    On coumadin    Patient Measurements: Height: 5\' 6"  (167.6 cm) Weight: 145 lb 12.8 oz (66.134 kg) IBW/kg (Calculated) : 63.8  Heparin dosing weight: 83 kg  Vital Signs: Temp: 98.8 F (37.1 C) (12/15 0557) Temp Source: Oral (12/15 0557) BP: 120/75 mmHg (12/15 0557) Pulse Rate: 66 (12/15 0557)  Labs:  Recent Labs  02/22/14 0345 02/23/14 0525 02/24/14 0431  HGB 7.5* 7.3* 7.2*  HCT 24.1* 23.2* 23.1*  PLT 372 375 354  LABPROT 20.4* 19.6* 19.3*  INR 1.73* 1.65* 1.61*  HEPARINUNFRC 0.46 0.33 0.50  CREATININE 0.90  --  0.93    Estimated Creatinine Clearance: 62.9 mL/min (by C-G formula based on Cr of 0.93).  Assessment: 73 YOM on warfarin PTA for hx Afib (Home dose 4mg  daily except for 6mg  on Mon and Thur - admit INR 2.51). Coumadin reversed 12/3 with vit K 5 mg SQ for perc drain placement. Warfarin doses held 12/3-12/6 in case further surgery needed. Receivied doses 12/7 & 12/8 and then held again for NGT placement d/t ileus. Heparin bridge started on 12/9. The patient was started on clear liquids on 12/13, no plans for surgery at this time, warfarin resumed 12/14.   The patient's heparin level this morning is therapeutic (HL 0.5 << 0.33, goal of 0.3-0.7). INR today remains SUBtherapeutic (1.61 << 1.65, goal of 2-3). INR remained elevated despite holding warfarin so dosing cautiously initially and watching INR trends closely. LFTs wnl this AM. Hgb 7.2, plts 354. No overt s/sx of bleeding noted.  Goal of Therapy:  INR 2-3 Heparin level 0.3-0.7 units/ml Monitor platelets by anticoagulation protocol: Yes    Plan:  1. Continue Heparin at the current rate of 1250 units/hr (12.5 ml/hr) 2. Repeat Warfarin 4 mg x 1 dose at 1800 today 3. Will continue to monitor for any signs/symptoms of bleeding and  will follow up with heparin level and PT/INR in the a.m 4. Will plan to discontinue heparin drip once INR >/= 2   Alycia Rossetti, PharmD, BCPS Clinical Pharmacist Pager: [phone] 02/24/2014 7:44 AM

## 2014-02-24 NOTE — Progress Notes (Signed)
PT Cancellation Note  Patient Details Name: Theodore [name] MRN: [id number] DOB: Jun 23, 1940   Cancelled Treatment:    Reason Eval/Treat Not Completed: Fatigue/lethargy limiting ability to participate. Patient politely declining PT session this afternoon, stating that he had been up and walking in room most of the day and was very tired. Will follow up with patient in AM   Collyn Ribas, Tonia Brooms 02/24/2014, 3:06 PM

## 2014-02-24 NOTE — Discharge Instructions (Signed)
Appendicitis Appendicitis is when the appendix is swollen (inflamed). The inflammation can lead to developing a hole (perforation) and a collection of pus (abscess). CAUSES  There is not always an obvious cause of appendicitis. Sometimes it is caused by an obstruction in the appendix. The obstruction can be caused by:  A small, hard, pea-sized ball of stool (fecalith).  Enlarged lymph glands in the appendix. SYMPTOMS   Pain around your belly button (navel) that moves toward your lower right belly (abdomen). The pain can become more severe and sharp as time passes.  Tenderness in the lower right abdomen. Pain gets worse if you cough or make a sudden movement.  Feeling sick to your stomach (nauseous).  Throwing up (vomiting).  Loss of appetite.  Fever.  Constipation.  Diarrhea.  Generally not feeling well. DIAGNOSIS   Physical exam.  Blood tests.  Urine test.  X-rays or a CT scan may confirm the diagnosis. TREATMENT  Once the diagnosis of appendicitis is made, the most common treatment is to remove the appendix as soon as possible. This procedure is called appendectomy. In an open appendectomy, a cut (incision) is made in the lower right abdomen and the appendix is removed. In a laparoscopic appendectomy, usually 3 small incisions are made. Long, thin instruments and a camera tube are used to remove the appendix. Most patients go home in 24 to 48 hours after appendectomy. In some situations, the appendix may have already perforated and an abscess may have formed. The abscess may have a "wall" around it as seen on a CT scan. In this case, a drain may be placed into the abscess to remove fluid, and you may be treated with antibiotic medicines that kill germs. The medicine is given through a tube in your vein (IV). Once the abscess has resolved, it may or may not be necessary to have an appendectomy. You may need to stay in the hospital longer than 48 hours. Document Released:  02/27/2005 Document Revised: 08/29/2011 Document Reviewed: 05/25/2009 [facility] Patient Information 2015 [place], [place]. This information is not intended to replace advice given to you by your health care provider. Make sure you discuss any questions you have with your health care provider.

## 2014-02-24 NOTE — Progress Notes (Signed)
NUTRITION FOLLOW UP  Intervention:   - Ensure Complete po BID, each supplement provides 350 kcal and 13 grams of protein - Recommend diet advancement as medically tolerated  Nutrition Dx:   Inadequate oral intake related to altered GI function as evidenced by NPO.  Goal:   Pt to meet >/= 90% of their estimated nutrition needs   Monitor:   Diet advancement, PO intake, labs, weight changes, I/O's  Assessment:   pt sent from [facility] due to perforated appendix and hypocoagulable state. Seen by Dr Arnoldo Morale at [facility] and felt that non operative management best. Pt states his RLQ abdominal pain started Monday and has been about the same. Sharp RLQ made worse when he moves around. CT shows perforated appendix and phlegmon. Some SOB and hx of CHF.   12/11: Pt s/p drainage of RLQ abcess by IR on 02/10/14. He was transferred to [facility] from [facility] d/t multiple fluid collections/abscess and terminal ileum twisted with possible necrosis. Perc drain was placed on 02/15/14. NGT was been reinserted as of 02/19/14.  Pt has been NPO with minimal consumption of clear liquids x 10 days. Reviewed MD notes, which reveal probable initiation of TPN if pt unable to progress. Wt of 184# likely an outlier. UBW 145#. Wt gain likely due to scale error as well as abdominal distention.   12/15: Pt is tolerating full liquids and having BM's. He is asking for "string beans and mashed potatoes." Per MD note, diet to be advanced to soft diet. Pt drinks Ensure Complete at home BID. Will order while in hospital. Pt advised to continue nutritional supplements at home.  - Per MD note, pt to be discharged if tolerates diet.   Height: Ht Readings from Last 1 Encounters:  02/10/14 5\' 6"  (1.676 m)    Weight Status:   Wt Readings from Last 1 Encounters:  02/24/14 145 lb 12.8 oz (66.134 kg)    Re-estimated needs:  Kcal: 1800-2000 Protein: 90-100 g Fluid: 1.8-2.0 L/day  Skin: intact  Diet Order: DIET SOFT   Intake/Output  Summary (Last 24 hours) at 02/24/14 1027 Last data filed at 02/24/14 0606  Gross per 24 hour  Intake 593.13 ml  Output    800 ml  Net -206.87 ml    Last BM: 12/15   Labs:   Recent Labs Lab 02/20/14 0639  02/21/14 0436 02/22/14 0345 02/24/14 0431  NA  --   < > 141 143 142  K  --   < > 4.1 3.6* 3.1*  CL  --   < > 104 99 99  CO2  --   < > 25 32 33*  BUN  --   < > 8 6 4*  CREATININE  --   < > 0.86 0.90 0.93  CALCIUM  --   < > 8.7 8.8 8.2*  MG 2.0  --   --   --   --   GLUCOSE  --   < > 98 78 86  < > = values in this interval not displayed.  CBG (last 3)  No results for input(s): GLUCAP in the last 72 hours.  Scheduled Meds: . allopurinol  300 mg Oral Daily  . ampicillin-sulbactam (UNASYN) IV  3 g Intravenous Q6H  . antiseptic oral rinse  7 mL Mouth Rinse BID  . brimonidine  1 drop Both Eyes Q12H   And  . timolol  1 drop Both Eyes Q12H  . budesonide-formoterol  2 puff Inhalation BID  . furosemide  40  mg Intravenous Daily  . latanoprost  1 drop Both Eyes QHS  . metoprolol  5 mg Intravenous 4 times per day  . sodium chloride  3 mL Intravenous Q12H  . tiotropium  18 mcg Inhalation Daily  . warfarin  4 mg Oral ONCE-1800  . Warfarin - Pharmacist Dosing Inpatient   Does not apply q1800    Continuous Infusions: . heparin 1,250 Units/hr (02/24/14 0321)    Laurette Schimke MS, RD, LDN

## 2014-02-25 DIAGNOSIS — E876 Hypokalemia: Secondary | ICD-10-CM

## 2014-02-25 DIAGNOSIS — K36 Other appendicitis: Secondary | ICD-10-CM

## 2014-02-25 DIAGNOSIS — I5031 Acute diastolic (congestive) heart failure: Secondary | ICD-10-CM

## 2014-02-25 LAB — BASIC METABOLIC PANEL
Anion gap: 9 (ref 5–15)
BUN: 6 mg/dL (ref 6–23)
CO2: 34 mEq/L — ABNORMAL HIGH (ref 19–32)
Calcium: 8.6 mg/dL (ref 8.4–10.5)
Chloride: 98 mEq/L (ref 96–112)
Creatinine, Ser: 0.82 mg/dL (ref 0.50–1.35)
GFR calc Af Amer: >90 mL/min (ref >90)
GFR, EST NON AFRICAN AMERICAN: 85 mL/min — AB (ref >90)
Glucose, Bld: 74 mg/dL (ref 70–99)
POTASSIUM: 3.9 meq/L (ref 3.7–5.3)
Sodium: 141 mEq/L (ref 137–147)

## 2014-02-25 LAB — CBC
HCT: 23.8 % — ABNORMAL LOW (ref 39.0–52.0)
HEMOGLOBIN: 7.3 g/dL — AB (ref 13.0–17.0)
MCH: 27.5 pg (ref 26.0–34.0)
MCHC: 30.7 g/dL (ref 30.0–36.0)
MCV: 89.8 fL (ref 78.0–100.0)
Platelets: 342 10*3/uL (ref 150–400)
RBC: 2.65 MIL/uL — AB (ref 4.22–5.81)
RDW: 19.7 % — ABNORMAL HIGH (ref 11.5–15.5)
WBC: 7 10*3/uL (ref 4.0–10.5)

## 2014-02-25 LAB — PROTIME-INR
INR: 1.61 — AB (ref 0.00–1.49)
PROTHROMBIN TIME: 19.3 s — AB (ref 11.6–15.2)

## 2014-02-25 LAB — HEPARIN LEVEL (UNFRACTIONATED): HEPARIN UNFRACTIONATED: 0.58 [IU]/mL (ref 0.30–0.70)

## 2014-02-25 LAB — MAGNESIUM: Magnesium: 1.4 mg/dL — ABNORMAL LOW (ref 1.5–2.5)

## 2014-02-25 MED ORDER — DILTIAZEM HCL ER COATED BEADS 180 MG PO CP24
180.0000 mg | ORAL_CAPSULE | Freq: Every day | ORAL | Status: DC
  Administered 2014-02-26: 180 mg via ORAL
  Filled 2014-02-25: qty 1

## 2014-02-25 MED ORDER — ENOXAPARIN SODIUM 60 MG/0.6ML ~~LOC~~ SOLN
60.0000 mg | Freq: Two times a day (BID) | SUBCUTANEOUS | Status: DC
  Administered 2014-02-25 – 2014-02-26 (×3): 60 mg via SUBCUTANEOUS
  Filled 2014-02-25 (×4): qty 0.6

## 2014-02-25 MED ORDER — FUROSEMIDE 40 MG PO TABS
40.0000 mg | ORAL_TABLET | Freq: Two times a day (BID) | ORAL | Status: DC
  Administered 2014-02-25 – 2014-02-26 (×2): 40 mg via ORAL
  Filled 2014-02-25 (×4): qty 1

## 2014-02-25 MED ORDER — POTASSIUM CHLORIDE CRYS ER 20 MEQ PO TBCR: 40.0000 meq | EXTENDED_RELEASE_TABLET | ORAL | Status: DC | PRN

## 2014-02-25 MED ORDER — METOPROLOL TARTRATE 25 MG PO TABS
25.0000 mg | ORAL_TABLET | Freq: Two times a day (BID) | ORAL | Status: DC
  Administered 2014-02-25: 25 mg via ORAL
  Filled 2014-02-25: qty 1

## 2014-02-25 MED ORDER — DILTIAZEM HCL ER COATED BEADS 180 MG PO CP24: 180.0000 mg | ORAL_CAPSULE | Freq: Every day | ORAL | Status: DC

## 2014-02-25 MED ORDER — WARFARIN SODIUM 6 MG PO TABS
6.0000 mg | ORAL_TABLET | Freq: Once | ORAL | Status: AC
  Administered 2014-02-25: 6 mg via ORAL
  Filled 2014-02-25: qty 1

## 2014-02-25 MED ORDER — MONTELUKAST SODIUM 10 MG PO TABS
10.0000 mg | ORAL_TABLET | Freq: Every day | ORAL | Status: DC
  Administered 2014-02-25: 10 mg via ORAL
  Filled 2014-02-25 (×2): qty 1

## 2014-02-25 NOTE — Progress Notes (Signed)
Patient ID: Theodore [name], male   DOB: 1940/11/22, 73 y.o.   MRN: [id number]    Subjective: Pt feels well today.  Ate almost all of his breakfast this morning.  Still passing flatus and having BMs  Objective: Vital signs in last 24 hours: Temp:  [98.1 F (36.7 C)-98.4 F (36.9 C)] 98.1 F (36.7 C) (12/16 0500) Pulse Rate:  [53-86] 86 (12/16 0500) Resp:  [17-19] 17 (12/16 0500) BP: (107-128)/(58-68) 128/58 mmHg (12/16 0500) SpO2:  [93 %-94 %] 94 % (12/16 0500) Weight:  [151 lb (68.493 kg)] 151 lb (68.493 kg) (12/16 0500) Last BM Date: 02/23/14  Intake/Output from previous day: 12/15 0701 - 12/16 0700 In: 946.3 [P.O.:240; I.V.:406.3; IV Piggyback:300] Out: 975 [Urine:975] Intake/Output this shift: Total I/O In: -  Out: 100 [Urine:100]  PE: Abd: soft, +BS, NT  Lab Results:   Recent Labs  02/24/14 0431 02/25/14 0447  WBC 6.3 7.0  HGB 7.2* 7.3*  HCT 23.1* 23.8*  PLT 354 342   BMET  Recent Labs  02/24/14 0431  NA 142  K 3.1*  CL 99  CO2 33*  GLUCOSE 86  BUN 4*  CREATININE 0.93  CALCIUM 8.2*   PT/INR  Recent Labs  02/24/14 0431 02/25/14 0447  LABPROT 19.3* 19.3*  INR 1.61* 1.61*   CMP     Component Value Date/Time   NA 142 02/24/2014 0431   K 3.1* 02/24/2014 0431   CL 99 02/24/2014 0431   CO2 33* 02/24/2014 0431   GLUCOSE 86 02/24/2014 0431   BUN 4* 02/24/2014 0431   CREATININE 0.93 02/24/2014 0431   CREATININE 1.07 09/29/2013 1132   CALCIUM 8.2* 02/24/2014 0431   PROT 6.2 02/16/2014 0755   ALBUMIN 2.2* 02/16/2014 0755   AST 15 02/24/2014 0431   ALT 6 02/24/2014 0431   ALKPHOS 83 02/16/2014 0755   BILITOT 1.5* 02/16/2014 0755   GFRNONAA 81* 02/24/2014 0431   GFRNONAA 68 09/29/2013 1132   GFRAA >90 02/24/2014 0431   GFRAA 79 09/29/2013 1132   Lipase     Component Value Date/Time   LIPASE 9* 02/10/2014 1106       Studies/Results: No results found.  Anti-infectives: Anti-infectives    Start     Dose/Rate Route Frequency  Ordered Stop   02/17/14 1200  Ampicillin-Sulbactam (UNASYN) 3 g in sodium chloride 0.9 % 100 mL IVPB     3 g100 mL/hr over 60 Minutes Intravenous Every 6 hours 02/17/14 1110     02/12/14 2000  piperacillin-tazobactam (ZOSYN) IVPB 3.375 g  Status:  Discontinued     3.375 g12.5 mL/hr over 240 Minutes Intravenous Every 8 hours 02/12/14 1753 02/17/14 1042   02/12/14 2000  vancomycin (VANCOCIN) IVPB 1000 mg/200 mL premix  Status:  Discontinued     1,000 mg200 mL/hr over 60 Minutes Intravenous Every 24 hours 02/12/14 1753 02/17/14 1042   02/11/14 1700  vancomycin (VANCOCIN) IVPB 1000 mg/200 mL premix  Status:  Discontinued     1,000 mg200 mL/hr over 60 Minutes Intravenous Every 24 hours 02/10/14 1607 02/12/14 1753   02/10/14 2200  piperacillin-tazobactam (ZOSYN) IVPB 3.375 g  Status:  Discontinued     3.375 g12.5 mL/hr over 240 Minutes Intravenous Every 8 hours 02/10/14 1602 02/12/14 1753   02/10/14 1700  vancomycin (VANCOCIN) 1,250 mg in sodium chloride 0.9 % 250 mL IVPB     1,250 mg166.7 mL/hr over 90 Minutes Intravenous  Once 02/10/14 1605 02/10/14 1934   02/10/14 1345  piperacillin-tazobactam (ZOSYN) IVPB 3.375  g     3.375 g100 mL/hr over 30 Minutes Intravenous  Once 02/10/14 1344 02/10/14 1452       Assessment/Plan  1. Acute perforated appendicitis with abscess, s/p perc drain on 02-15-14, now removed 2. COPD 3. Chronic A fib 4. Anemia 5. Ileus, resolved  Plan: 1. Patient tolerating his soft diet.  He ate basically all of his breakfast this am and tolerated it well. 2. Patient will follow up in our office.  We are setting that up. 3. He is surgically stable for dc home.   LOS: 15 days    Geral Tuch E 02/25/2014, 9:32 AM Pager: [phone]

## 2014-02-25 NOTE — Progress Notes (Signed)
ANTICOAGULATION CONSULT NOTE - Follow-Up  Pharmacy Consult for Heparin + Warfarin to transition to LMWH and warfarin Indication: atrial fibrillation   Allergies  Allergen Reactions  . Aspirin Other (See Comments)    On coumadin    Patient Measurements: Height: 5\' 6"  (167.6 cm) Weight: 151 lb (68.493 kg) IBW/kg (Calculated) : 63.8  Heparin dosing weight: 83 kg  Vital Signs: Temp: 98.1 F (36.7 C) (12/16 0500) Temp Source: Oral (12/16 0500) BP: 128/58 mmHg (12/16 0500) Pulse Rate: 86 (12/16 0500)  Labs:  Recent Labs  02/23/14 0525 02/24/14 0431 02/25/14 0447  HGB 7.3* 7.2* 7.3*  HCT 23.2* 23.1* 23.8*  PLT 375 354 342  LABPROT 19.6* 19.3* 19.3*  INR 1.65* 1.61* 1.61*  HEPARINUNFRC 0.33 0.50 0.58  CREATININE  --  0.93  --     Estimated Creatinine Clearance: 62.9 mL/min (by C-G formula based on Cr of 0.93).  Assessment: 73 YOM on warfarin PTA for hx Afib (Home dose 4mg  daily except for 6mg  on Mon and Thur - admit INR 2.51). Coumadin reversed 12/3 with vit K 5 mg SQ for perc drain placement. Warfarin doses held 12/3-12/6 in case further surgery needed. Receivied doses 12/7 & 12/8 and then held again for NGT placement d/t ileus. Heparin bridge started on 12/9. The patient was started on clear liquids on 12/13, no plans for surgery at this time, warfarin resumed 12/14. Now pharmacy consulted to switch heparin to LMWH on 12/16.   The patient's heparin level this morning is therapeutic (HL 0.58, goal of 0.3-0.7). INR today remains SUBtherapeutic (1.61, goal of 2-3). INR remained elevated despite holding warfarin so dosing cautiously initially and watching INR trends closely. LFTs wnl this AM. Hgb 7.3, plts 342. No overt s/sx of bleeding noted.  Goal of Therapy: INR 2-3  Plan:  -dc heparin drip -LMWH 60 mg sq q12h until INR > 2 -coumadin 6 mg po x 1 dose today -daily INR  Eudelia Bunch, Pharm.D. [phone] 02/25/2014 10:52 AM

## 2014-02-25 NOTE — Progress Notes (Signed)
Notified Dr. Wynelle Cleveland that pt had a seven run of v-tach and that pt denied chest pain or shortness of breath, order placed for BMET and magnesium.

## 2014-02-25 NOTE — Progress Notes (Signed)
PT Cancellation Note  Patient Details Name: Theodore [name] MRN: [id number] DOB: May 24, 1940   Cancelled Treatment:    Reason Eval/Treat Not Completed: Patient declined, no reason specified Pt politely declined participating in therapy today reporting he has been ambulating in his room. "I am good, I feel good." Pt to d/c tomorrow. Will follow up if pt still in hospital.   Candy Sledge A 02/25/2014, 4:14 PM  Candy Sledge, [place], DPT [phone]

## 2014-02-25 NOTE — Progress Notes (Addendum)
[facility] PROGRESS NOTE  Theodore [name] RSW:[id number] DOB: 08-Jun-1940 DOA: 02/10/2014 PCP: Tula Nakayama, MD  Brief narrative Patient is a 73 year old with past medical history of A. fib on Coumadin, diastolic heart failure, COPD, hypertension, anemia who presented to the ED complaining of abdominal pain CT of abdomen pelvis initially reported ruptured appendicitis with associated 4.5-3.2 cm abscess and enteritis.  Patient was transitioned to [facility] and Gen. surgery consulted. Interventional radiology consulted and placed drain. Abscess resolved after drain placement and drained successfully removed.   HPI/Subjective: Theodore [name] has no complaints-Theodore [name] is coughing up white mucus-no chest pain or dyspnea-eating well  Assessment/Plan:  Perforated appendix with abscess/sepsis; CT: Findings are concerning for ruptured appendicitis with an associated approximately 4.5 x 3.2 cm abscess and enteritis -Theodore [name] is doing well now - tolerating a soft diet without any discomfort -Percutaneous drain placed on 02/15/14 and removed 02/22/14 -Will DC Augmentin/clavulanic acid today - Cardiology consulted for surgical clearance-no need for further cardiac testing - General surgery has been following and will follow the patient in the office-from their point of view Theodore [name] is stable for discharge   CHF, diastolic HF; echo (0938): LVEF 18%, diastolic dysfunction, pulmonary HTN PASP 74, TR - CT from 12/11 reports moderate amount of bilateral pleural effusions and moderate ascites - Continue 40 mg of lasix by mouth-still has crackles on exam today at bilateral bases-see below-SLP eval requested as well  CT: Bilateral lower lobe bronchiectatic changes and consolidation are suspicious for chronic aspiration pneumonitis -Speech therapy evaluation requested today   COPD on home oxygen supplementation - no wheezing on exam; cont supplemental oxygen, bronchodilators as needed   A. fib on Coumadin; HR is stable -  Coumadin was on hold and has been restarted -Theodore [name] has been on IV Lopressor while nothing by mouth-we'll resume his oral Cardizem - on Heparin- transition to Lovenox- allow pt to inject-continue until INR therapeutic-according to new guidelines CHA2DS2 Vasc score 2 or greater requires bridging  AKI on CKD II in the setting of sepsis; likely ATN - resolved  Acute anemia - stable - added ferrous sulfate    Hypokalemia -Due to Lasix - Will continue to replace orally and reassess  Code Status: stable Family Communication: None at bedside Disposition Plan: Home with home health which has already been set up   Consultants:  General surgery  IR  Cardiology  Procedures:  pending  Antibiotics:  Vancomycin and zosyn>>>02/17/14  Ampicillin-Sulbactam 12/8-  12/16   Objective: Filed Vitals:   02/25/14 1213  BP: 106/59  Pulse: 76  Temp:   Resp:     Intake/Output Summary (Last 24 hours) at 02/25/14 1330 Last data filed at 02/25/14 0831  Gross per 24 hour  Intake 946.25 ml  Output    575 ml  Net 371.25 ml   Filed Weights   02/23/14 0618 02/24/14 0500 02/25/14 0500  Weight: 66.86 kg (147 lb 6.4 oz) 66.134 kg (145 lb 12.8 oz) 68.493 kg (151 lb)    Exam:   General:  Pt in nad, alert and awake  Cardiovascular: No cyanosis  Respiratory: No increased wob, no wheezes-crackles at bilateral bases  Abdomen: soft, NT, no guarding  Musculoskeletal: no clubbing or cyanosis   Data Reviewed: Basic Metabolic Panel:  Recent Labs Lab 02/20/14 0639 02/21/14 0041 02/21/14 0436 02/22/14 0345 02/24/14 0431  NA  --  142 141 143 142  K  --  4.1 4.1 3.6* 3.1*  CL  --  104 104 99 99  CO2  --  28 25 32 33*  GLUCOSE  --  102* 98 78 86  BUN  --  8 8 6  4*  CREATININE  --  0.91 0.86 0.90 0.93  CALCIUM  --  8.6 8.7 8.8 8.2*  MG 2.0  --   --   --   --    Liver Function Tests:  Recent Labs Lab 02/24/14 0431  AST 15  ALT 6   No results for input(s): LIPASE, AMYLASE in  the last 168 hours. No results for input(s): AMMONIA in the last 168 hours. CBC:  Recent Labs Lab 02/21/14 0436 02/22/14 0345 02/23/14 0525 02/24/14 0431 02/25/14 0447  WBC 11.8* 11.4* 8.4 6.3 7.0  HGB 7.5* 7.5* 7.3* 7.2* 7.3*  HCT 23.8* 24.1* 23.2* 23.1* 23.8*  MCV 87.5 88.6 88.5 88.5 89.8  PLT 353 372 375 354 342   Cardiac Enzymes: No results for input(s): CKTOTAL, CKMB, CKMBINDEX, TROPONINI in the last 168 hours. BNP (last 3 results)  Recent Labs  06/16/13 1554 06/17/13 0537 07/09/13 0555  PROBNP 3560.0* 3222.0* 3842.0*   CBG: No results for input(s): GLUCAP in the last 168 hours.  No results found for this or any previous visit (from the past 240 hour(s)).   Studies: No results found.  Scheduled Meds: . allopurinol  300 mg Oral Daily  . antiseptic oral rinse  7 mL Mouth Rinse BID  . brimonidine  1 drop Both Eyes Q12H   And  . timolol  1 drop Both Eyes Q12H  . budesonide-formoterol  2 puff Inhalation BID  . enoxaparin (LOVENOX) injection  60 mg Subcutaneous Q12H  . feeding supplement (ENSURE COMPLETE)  237 mL Oral BID BM  . ferrous sulfate  325 mg Oral BID WC  . furosemide  40 mg Oral Daily  . latanoprost  1 drop Both Eyes QHS  . metoprolol tartrate  25 mg Oral BID  . sodium chloride  3 mL Intravenous Q12H  . tiotropium  18 mcg Inhalation Daily  . warfarin  6 mg Oral ONCE-1800  . Warfarin - Pharmacist Dosing Inpatient   Does not apply q1800   Continuous Infusions:     Time spent: > 35 minutes    Joshaua Epple, MD [facility] [email or web address], password [facility] 02/25/2014, 1:30 PM  LOS: 15 days

## 2014-02-26 ENCOUNTER — Inpatient Hospital Stay (HOSPITAL_COMMUNITY): Payer: Medicare Other

## 2014-02-26 DIAGNOSIS — I5031 Acute diastolic (congestive) heart failure: Secondary | ICD-10-CM | POA: Insufficient documentation

## 2014-02-26 DIAGNOSIS — N179 Acute kidney failure, unspecified: Secondary | ICD-10-CM

## 2014-02-26 DIAGNOSIS — I4891 Unspecified atrial fibrillation: Secondary | ICD-10-CM

## 2014-02-26 LAB — BASIC METABOLIC PANEL
ANION GAP: 10 (ref 5–15)
BUN: 6 mg/dL (ref 6–23)
CO2: 34 mEq/L — ABNORMAL HIGH (ref 19–32)
Calcium: 8.7 mg/dL (ref 8.4–10.5)
Chloride: 98 mEq/L (ref 96–112)
Creatinine, Ser: 0.88 mg/dL (ref 0.50–1.35)
GFR calc non Af Amer: 83 mL/min — ABNORMAL LOW (ref >90)
Glucose, Bld: 88 mg/dL (ref 70–99)
POTASSIUM: 3.9 meq/L (ref 3.7–5.3)
Sodium: 142 mEq/L (ref 137–147)

## 2014-02-26 LAB — PROTIME-INR
INR: 1.54 — AB (ref 0.00–1.49)
PROTHROMBIN TIME: 18.6 s — AB (ref 11.6–15.2)

## 2014-02-26 MED ORDER — BRIMONIDINE TARTRATE 0.2 % OP SOLN: 1.0000 [drp] | Freq: Two times a day (BID) | OPHTHALMIC | Status: DC

## 2014-02-26 MED ORDER — TIMOLOL MALEATE 0.5 % OP SOLN: 1.0000 [drp] | Freq: Two times a day (BID) | OPHTHALMIC | Status: DC

## 2014-02-26 MED ORDER — FERROUS SULFATE 325 (65 FE) MG PO TABS: 325.0000 mg | ORAL_TABLET | Freq: Two times a day (BID) | ORAL | Status: AC

## 2014-02-26 MED ORDER — WARFARIN SODIUM 6 MG PO TABS
6.0000 mg | ORAL_TABLET | Freq: Once | ORAL | Status: DC
  Filled 2014-02-26: qty 1

## 2014-02-26 MED ORDER — ENOXAPARIN SODIUM 60 MG/0.6ML ~~LOC~~ SOLN: 60.0000 mg | Freq: Two times a day (BID) | SUBCUTANEOUS | Status: DC

## 2014-02-26 NOTE — Procedures (Signed)
Objective Swallowing Evaluation: Fiberoptic Endoscopic Evaluation of Swallowing  Patient Details  Name: Theodore [name] MRN: [id number] Date of Birth: 22-Jun-1940  Today's Date: 02/26/2014 Time: 1030-1103 SLP Time Calculation (min) (ACUTE ONLY): 33 min  Past Medical History:  Past Medical History  Diagnosis Date  . Glaucoma     uses eye drops daily  . Allergic rhinitis     takes Singulair at bedtime  . Atrial fibrillation     takes Coumadin daily  . Diastolic heart failure     LVEF 27-25%, rate 2 diastolic dysfunction 05/6642  . Pulmonary hypertension     70 mmHg 03/2012  . Asthma     Albuterol neb and inhaler as needed  . Anxiety     takes Xanax daily as needed  . COPD (chronic obstructive pulmonary disease)     Symbicort daily  . Essential hypertension, benign     takes Metoprolol and Diltiazem daily  . Anemia     takes ferrous sulfate daily  . History of bronchitis   . Shortness of breath     with exertion  . Numbness     fingertips on both hands  . Lung nodules   . History of blood transfusion    Past Surgical History:  Past Surgical History  Procedure Laterality Date  . Bilateral cataract surgery    . Herniorrhapy    . Tendon repair      Right hand surgical procedure for a tendon repair  . Colonoscopy N/A 11/29/2012    Procedure: COLONOSCOPY;  Surgeon: Danie Binder, MD;  [facility];  Service: Endoscopy;  Laterality: N/A;  1:00  . Esophagogastroduodenoscopy N/A 11/29/2012    Procedure: ESOPHAGOGASTRODUODENOSCOPY (EGD);  Surgeon: Danie Binder, MD;  [facility];  Service: Endoscopy;  Laterality: N/A;  . Eye surgery    . Hernia repair    . Radiology with anesthesia N/A 07/10/2013    Procedure: EMBOLIZATION-RADIOLOGY WITH ANESTHESIA;  Surgeon: Rob Hickman, MD;  Location: [place];  Service: Radiology;  Laterality: N/A;  . Nose surgery      d/t nosebleeds  . Video bronchoscopy with endobronchial ultrasound N/A 11/21/2013   Procedure: VIDEO BRONCHOSCOPY WITH ENDOBRONCHIAL ULTRASOUND;  Surgeon: Melrose Nakayama, MD;  Location: [place];  Service: Thoracic;  Laterality: N/A;  . Mediastinoscopy N/A 11/21/2013    Procedure: MEDIASTINOSCOPY;  Surgeon: Melrose Nakayama, MD;  Location: [place];  Service: Thoracic;  Laterality: N/A;   HPI:  Patient is a 73 year old with past medical history of A. fib on Coumadin, diastolic heart failure, COPD, hypertension, asthma, anemia who presented to the ED complaining of abdominal pain CT of abdomen pelvis initially reported ruptured appendicitis with associated 4.5-3.2 cm abscess and enteritis. Patient was transitioned to [facility] and Gen. surgery consulted. Interventional radiology consulted and placed drain. Abscess resolved after drain placement and drained successfully removed. CT chest  Bilateral lower lobe bronchiectatic changes and consolidation are suspicious for chronic aspiration pneumonitis.     Assessment / Plan / Recommendation Clinical Impression  Dysphagia Diagnosis: Within Functional Limits Clinical impression: Patient presents with a functional oropharyngeal swallow. Oral phase and timing of swallow WFL. No penetration or aspiration observed. No SLP f/u indicated with recommendations for a regular diet.     Treatment Recommendation  No treatment recommended at this time    Diet Recommendation Regular;Thin liquid   Liquid Administration via: Cup;Straw Medication Administration: Whole meds with liquid Supervision: Patient able to self feed Compensations: Slow rate;Small  sips/bites Postural Changes and/or Swallow Maneuvers: Seated upright 90 degrees    Other  Recommendations Recommended Consults: FEES Oral Care Recommendations: Oral care BID   Follow Up Recommendations  None               General HPI: Patient is a 73 year old with past medical history of A. fib on Coumadin, diastolic heart failure, COPD, hypertension, asthma, anemia who presented to  the ED complaining of abdominal pain CT of abdomen pelvis initially reported ruptured appendicitis with associated 4.5-3.2 cm abscess and enteritis. Patient was transitioned to [facility] and Gen. surgery consulted. Interventional radiology consulted and placed drain. Abscess resolved after drain placement and drained successfully removed. CT chest  Bilateral lower lobe bronchiectatic changes and consolidation are suspicious for chronic aspiration pneumonitis. Type of Study: Fiberoptic Endoscopic Evaluation of Swallowing Reason for Referral: Objectively evaluate swallowing function Previous Swallow Assessment: none Diet Prior to this Study: Dysphagia 3 (soft);Thin liquids (soft, bland diet) Temperature Spikes Noted: No Respiratory Status: Nasal cannula History of Recent Intubation: Yes Length of Intubations (days):  (surgery only) Behavior/Cognition: Alert;Cooperative;Pleasant mood Oral Cavity - Dentition: Dentures, top;Dentures, bottom Oral Motor / Sensory Function: Within functional limits Self-Feeding Abilities: Able to feed self Patient Positioning: Upright in chair Baseline Vocal Quality: Hoarse Volitional Cough: Strong Volitional Swallow: Able to elicit Anatomy: Within functional limits Pharyngeal Secretions: Not observed secondary MBS    Reason for Referral Objectively evaluate swallowing function   Oral Phase Oral Preparation/Oral Phase Oral Phase: WFL   Pharyngeal Phase Pharyngeal Phase Pharyngeal Phase: Within functional limits  Cervical Esophageal Phase    GO   Theodore Rainwater MA, CCC-SLP [phone]  Cervical Esophageal Phase Cervical Esophageal Phase: [facility]         Theodore [name] 02/26/2014, 11:15 AM

## 2014-02-26 NOTE — Progress Notes (Signed)
ANTICOAGULATION CONSULT NOTE - Follow-Up  Pharmacy Consult for warfarin, lovenox Indication: atrial fibrillation   Allergies  Allergen Reactions  . Aspirin Other (See Comments)    On coumadin    Patient Measurements: Height: 5\' 6"  (167.6 cm) Weight: 154 lb 11.2 oz (70.171 kg) IBW/kg (Calculated) : 63.8  Heparin dosing weight: 83 kg  Vital Signs: Temp: 98.3 F (36.8 C) (12/17 0642) Temp Source: Oral (12/16 2209) BP: 147/90 mmHg (12/17 0642) Pulse Rate: 83 (12/17 0642)  Labs:  Recent Labs  02/24/14 0431 02/25/14 0447 02/25/14 1846 02/26/14 0448  HGB 7.2* 7.3*  --   --   HCT 23.1* 23.8*  --   --   PLT 354 342  --   --   LABPROT 19.3* 19.3*  --  18.6*  INR 1.61* 1.61*  --  1.54*  HEPARINUNFRC 0.50 0.58  --   --   CREATININE 0.93  --  0.82 0.88    Estimated Creatinine Clearance: 66.5 mL/min (by C-G formula based on Cr of 0.88).  Assessment: 73 YOM on warfarin PTA for hx Afib (Home dose 4mg  daily except for 6mg  on Mon and Thur - admit INR 2.51). Coumadin reversed 12/3 with vit K 5 mg SQ for perc drain placement. Warfarin doses held 12/3-12/6 in case further surgery needed. Receivied doses 12/7 & 12/8 and then held again for NGT placement d/t ileus. Heparin bridge started on 12/9. The patient was started on clear liquids on 12/13, no plans for surgery at this time, warfarin resumed 12/14. Now pharmacy consulted to switch heparin to LMWH on 12/16. INR remains below goal at 1.54, no new CBC today, no bleeding noted.   Goal of Therapy: INR 2-3  Plan:  1. Repeat warfarin 6mg  PO x 1 tonight 2. Continue lovenox 60mg  SQ Q12H (dose rounded down for ease of use if pt discharged on lovenox) 3. F/u AM INR  Salome Arnt, PharmD, BCPS Pager # [phone] 02/26/2014 9:03 AM

## 2014-02-26 NOTE — Progress Notes (Addendum)
Ilia E Bartok to be D/C'd Home with Home Health per MD order.  Discussed with the patient and all questions fully answered.  VVS, Skin clean, dry and intact without evidence of skin break down, no evidence of skin tears noted. IV catheter discontinued intact. Site without signs and symptoms of complications. Dressing and pressure applied.  An After Visit Summary was printed and given to the patient. Patient received prescription. Per patient, family will bring portable O2 tank for him to use to transport home.  D/c education completed with patient/family including follow up instructions, medication list, d/c activities limitations if indicated, with other d/c instructions as indicated by MD - patient able to verbalize understanding, all questions fully answered.   Patient instructed to return to ED, call 911, or call MD for any changes in condition.   Patient escorted via [place], and D/C home via private auto.  Micki Riley 02/26/2014 11:38 AM

## 2014-02-26 NOTE — Progress Notes (Signed)
Subjective: States he feels great today and would like to be discharged from hospital. No abdominal pain. Tolerating regular diet. Normal bowel function.  Objective: Vital signs in last 24 hours: Temp:  [98.3 F (36.8 C)-98.8 F (37.1 C)] 98.3 F (36.8 C) (12/17 0642) Pulse Rate:  [71-83] 83 (12/17 0642) Resp:  [16-19] 16 (12/17 0642) BP: (106-147)/(59-90) 147/90 mmHg (12/17 0642) SpO2:  [93 %-100 %] 99 % (12/17 0850) Weight:  [70.171 kg (154 lb 11.2 oz)] 70.171 kg (154 lb 11.2 oz) (12/17 6606) Last BM Date: 02/25/14  Intake/Output from previous day: 12/16 0701 - 12/17 0700 In: 360 [P.O.:360] Out: 1225 [Urine:1225] Intake/Output this shift:    General appearance: alert, cooperative, appears stated age and no distress GI: soft, NABS, nontender Incision/Wound: RLQ drain site clean and healed and without drainage  Lab Results:   Recent Labs  02/24/14 0431 02/25/14 0447  WBC 6.3 7.0  HGB 7.2* 7.3*  HCT 23.1* 23.8*  PLT 354 342   BMET  Recent Labs  02/25/14 1846 02/26/14 0448  NA 141 142  K 3.9 3.9  CL 98 98  CO2 34* 34*  GLUCOSE 74 88  BUN 6 6  CREATININE 0.82 0.88  CALCIUM 8.6 8.7   PT/INR  Recent Labs  02/25/14 0447 02/26/14 0448  LABPROT 19.3* 18.6*  INR 1.61* 1.54*   ABG No results for input(s): PHART, HCO3 in the last 72 hours.  Invalid input(s): PCO2, PO2  Studies/Results: Dg Chest Port 1 View  02/26/2014   CLINICAL DATA:  Pulmonary edema  EXAM: PORTABLE CHEST - 1 VIEW  COMPARISON:  02/13/2014  FINDINGS: Cardiomegaly is noted. Central mild vascular congestion without convincing pulmonary edema. Bilateral small pleural effusion with bilateral basilar atelectasis or infiltrate.  IMPRESSION: Cardiomegaly. Central mild vascular congestion without convincing pulmonary edema. Bilateral small pleural effusion with bilateral basilar atelectasis or infiltrate.   Electronically Signed   By: Lahoma Crocker M.D.   On: 02/26/2014 10:06     Anti-infectives: Anti-infectives    Start     Dose/Rate Route Frequency Ordered Stop   02/17/14 1200  Ampicillin-Sulbactam (UNASYN) 3 g in sodium chloride 0.9 % 100 mL IVPB  Status:  Discontinued     3 g100 mL/hr over 60 Minutes Intravenous Every 6 hours 02/17/14 1110 02/25/14 1002   02/12/14 2000  piperacillin-tazobactam (ZOSYN) IVPB 3.375 g  Status:  Discontinued     3.375 g12.5 mL/hr over 240 Minutes Intravenous Every 8 hours 02/12/14 1753 02/17/14 1042   02/12/14 2000  vancomycin (VANCOCIN) IVPB 1000 mg/200 mL premix  Status:  Discontinued     1,000 mg200 mL/hr over 60 Minutes Intravenous Every 24 hours 02/12/14 1753 02/17/14 1042   02/11/14 1700  vancomycin (VANCOCIN) IVPB 1000 mg/200 mL premix  Status:  Discontinued     1,000 mg200 mL/hr over 60 Minutes Intravenous Every 24 hours 02/10/14 1607 02/12/14 1753   02/10/14 2200  piperacillin-tazobactam (ZOSYN) IVPB 3.375 g  Status:  Discontinued     3.375 g12.5 mL/hr over 240 Minutes Intravenous Every 8 hours 02/10/14 1602 02/12/14 1753   02/10/14 1700  vancomycin (VANCOCIN) 1,250 mg in sodium chloride 0.9 % 250 mL IVPB     1,250 mg166.7 mL/hr over 90 Minutes Intravenous  Once 02/10/14 1605 02/10/14 1934   02/10/14 1345  piperacillin-tazobactam (ZOSYN) IVPB 3.375 g     3.375 g100 mL/hr over 30 Minutes Intravenous  Once 02/10/14 1344 02/10/14 1452      Assessment: s/p * No surgery found *  1. Acute perforated appendicitis with abscess, s/p perc drain on 02-15-14, now removed 2. COPD 3. Chronic A fib 4. Anemia 5. Ileus  Plan: 1. Patient tolerating his diet.  2. Patient will follow up in our office per discharge instructions. 3. He is surgically stable for discharge.    LOS: 16 days    Catlin Doria LEE 02/26/2014

## 2014-02-26 NOTE — Evaluation (Signed)
Clinical/Bedside Swallow Evaluation Patient Details  Name: Theodore [name] MRN: [id number] Date of Birth: October 27, 1940  Today's Date: 02/26/2014 Time: 1002-1012 SLP Time Calculation (min) (ACUTE ONLY): 10 min  Past Medical History:  Past Medical History  Diagnosis Date  . Glaucoma     uses eye drops daily  . Allergic rhinitis     takes Singulair at bedtime  . Atrial fibrillation     takes Coumadin daily  . Diastolic heart failure     LVEF 08-65%, rate 2 diastolic dysfunction 09/8467  . Pulmonary hypertension     70 mmHg 03/2012  . Asthma     Albuterol neb and inhaler as needed  . Anxiety     takes Xanax daily as needed  . COPD (chronic obstructive pulmonary disease)     Symbicort daily  . Essential hypertension, benign     takes Metoprolol and Diltiazem daily  . Anemia     takes ferrous sulfate daily  . History of bronchitis   . Shortness of breath     with exertion  . Numbness     fingertips on both hands  . Lung nodules   . History of blood transfusion    Past Surgical History:  Past Surgical History  Procedure Laterality Date  . Bilateral cataract surgery    . Herniorrhapy    . Tendon repair      Right hand surgical procedure for a tendon repair  . Colonoscopy N/A 11/29/2012    Procedure: COLONOSCOPY;  Surgeon: Danie Binder, MD;  [facility];  Service: Endoscopy;  Laterality: N/A;  1:00  . Esophagogastroduodenoscopy N/A 11/29/2012    Procedure: ESOPHAGOGASTRODUODENOSCOPY (EGD);  Surgeon: Danie Binder, MD;  [facility];  Service: Endoscopy;  Laterality: N/A;  . Eye surgery    . Hernia repair    . Radiology with anesthesia N/A 07/10/2013    Procedure: EMBOLIZATION-RADIOLOGY WITH ANESTHESIA;  Surgeon: Rob Hickman, MD;  Location: [place];  Service: Radiology;  Laterality: N/A;  . Nose surgery      d/t nosebleeds  . Video bronchoscopy with endobronchial ultrasound N/A 11/21/2013    Procedure: VIDEO BRONCHOSCOPY WITH ENDOBRONCHIAL  ULTRASOUND;  Surgeon: Melrose Nakayama, MD;  Location: [place];  Service: Thoracic;  Laterality: N/A;  . Mediastinoscopy N/A 11/21/2013    Procedure: MEDIASTINOSCOPY;  Surgeon: Melrose Nakayama, MD;  Location: [place];  Service: Thoracic;  Laterality: N/A;   HPI:  Patient is a 73 year old with past medical history of A. fib on Coumadin, diastolic heart failure, COPD, hypertension, asthma, anemia who presented to the ED complaining of abdominal pain CT of abdomen pelvis initially reported ruptured appendicitis with associated 4.5-3.2 cm abscess and enteritis. Patient was transitioned to [facility] and Gen. surgery consulted. Interventional radiology consulted and placed drain. Abscess resolved after drain placement and drained successfully removed. CT chest  Bilateral lower lobe bronchiectatic changes and consolidation are suspicious for chronic aspiration pneumonitis.   Assessment / Plan / Recommendation Clinical Impression  Patient presents with a functional appearing oropharyngeal swallow without overt indication of aspiration. Discussed with MD. In light of h/o COPD and acute CXR results, MD wishes to proceed with instrumental testing to ensure safe diet recommendations prior to d/c. Will proceed with FEES this pm.     Aspiration Risk  Mild    Diet Recommendation Regular;Thin liquid   Liquid Administration via: Cup;Straw Medication Administration: Whole meds with liquid Supervision: Patient able to self feed Compensations: Slow rate;Small sips/bites  Postural Changes and/or Swallow Maneuvers: Seated upright 90 degrees    Other  Recommendations Recommended Consults: FEES Oral Care Recommendations: Oral care BID   Follow Up Recommendations  None       Pertinent Vitals/Pain n/a     Swallow Study    General HPI: Patient is a 73 year old with past medical history of A. fib on Coumadin, diastolic heart failure, COPD, hypertension, asthma, anemia who presented to the ED complaining  of abdominal pain CT of abdomen pelvis initially reported ruptured appendicitis with associated 4.5-3.2 cm abscess and enteritis. Patient was transitioned to [facility] and Gen. surgery consulted. Interventional radiology consulted and placed drain. Abscess resolved after drain placement and drained successfully removed. CT chest  Bilateral lower lobe bronchiectatic changes and consolidation are suspicious for chronic aspiration pneumonitis. Type of Study: Bedside swallow evaluation Previous Swallow Assessment: none Diet Prior to this Study: Thin liquids;Dysphagia 3 (soft) (soft, bland diet) Temperature Spikes Noted: No Respiratory Status: Nasal cannula History of Recent Intubation: Yes Length of Intubations (days):  (surgery only) Behavior/Cognition: Alert;Cooperative;Pleasant mood Oral Cavity - Dentition: Dentures, top;Dentures, bottom Self-Feeding Abilities: Able to feed self Patient Positioning: Upright in chair Baseline Vocal Quality: Hoarse Volitional Cough: Strong Volitional Swallow: Able to elicit    Oral/Motor/Sensory Function Overall Oral Motor/Sensory Function: Appears within functional limits for tasks assessed   Ice Chips Ice chips: Not tested   Thin Liquid Thin Liquid: Within functional limits Presentation: Cup;Straw;Self Fed    Nectar Thick Nectar Thick Liquid: Not tested   Honey Thick Honey Thick Liquid: Not tested   Puree Puree: Within functional limits Presentation: Self Fed;Spoon   Solid   GO   Theodore [name], CCC-SLP [phone]  Solid: Within functional limits Presentation: Self Fed       Theodore [name] 02/26/2014,11:13 AM

## 2014-02-26 NOTE — Discharge Summary (Signed)
Physician Discharge Summary  Theodore [name] FTD:[id number] DOB: Mar 27, 1940 DOA: 02/10/2014  PCP: Tula Nakayama, MD  Admit date: 02/10/2014 Discharge date: 02/26/2014  Time spent: 60 minutes  Recommendations for Outpatient Follow-up:  1. Follow-up on INR and discontinue Lovenox when therapeutic 2. We'll be discharged home with home health PTand RN 3. 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 fib on Coumadin, diastolic heart failure, COPD, hypertension, anemia who presented to the ED complaining of abdominal pain CT of abdomen pelvis initially reported ruptured appendicitis with associated 4.5-3.2 cm abscess and enteritis. Patient was transitioned to [facility] and Gen. surgery consulted. Interventional radiology consulted and placed drain. Abscess resolved after drain placement and drained successfully removed.  Hospital Course:  Perforated appendix with abscess/sepsis; CT: Findings are concerning for ruptured appendicitis with an associated approximately 4.5 x 3.2 cm abscess and enteritis -He is doing well now - tolerating a soft diet without any discomfort -Percutaneous drain placed on 02/15/14 and removed 02/22/14 - DC'd Augmentin/clavulanic acid 12/16 - Cardiology consulted for surgical clearance-no need for further cardiac testing - General surgery has been following and will follow the patient in the office-from their point of view he is stable for discharge  Acute CHF,  diastolic HF; echo (0623): LVEF 76%, diastolic dysfunction, pulmonary HTN PASP 74, TR - CT from 12/11 reports moderate amount of bilateral pleural effusions and moderate ascites -- CXR today is clear except for atelectasis at bases- no dyspnea on exertion-  can stop Lasix today - he can resume his usual dose of Demadex and potassium tomorrow -still has crackles on exam today which are likely from atelectasis as he has had no fevers, leukocytosis or significant cough- has mild cough with white sputum -He is at baseline O2 requirements of 2 L - SLP eval requested to ensure that he was not aspirating-he has had an FEES today which is normal  COPD on home oxygen supplementation - no wheezing on exam; cont supplemental oxygen, bronchodilators as needed   A. fib on Coumadin; HR is stable -Has been rate controlled - Coumadin was on hold and has been restarted -He has been on IV Lopressor while nothing by mouth-  resumed his oral Cardizem today - on Heparin- transitioned to Lovenox 12/16- allowing pt to inject-continue until INR therapeutic-according to new guidelines CHA2DS2 Vasc score 2 or greater requires bridging -Have home health RN to do INR checks  AKI on CKD II in the setting of sepsis; likely ATN - resolved  Chronic anemia - stable between 7-8 range - added ferrous sulfate - see anemia panel below  Hypokalemia -Due to Lasix and stable today at 3.9   Consultants:  General surgery  IR  Cardiology  Procedures:  pending  Antibiotics:  Vancomycin and zosyn>>>02/17/14  Ampicillin-Sulbactam 12/8- 12/16  Discharge Exam: Filed Weights   02/24/14 0500 02/25/14 0500 02/26/14 0642  Weight: 66.134 kg (145 lb 12.8 oz) 68.493 kg (151 lb) 70.171 kg (154 lb 11.2 oz)   Filed Vitals:   02/26/14  0642  BP: 147/90  Pulse: 83  Temp: 98.3 F (36.8 C)  Resp: 16    General: AAO x 3, no distress Cardiovascular: RRR, no murmurs  Respiratory: Coarse crackles at bilateral  bases-oxygen saturation 99% on 2 L GI: soft, non-tender, non-distended, bowel sound positive Extremities: No cyanosis clubbing or edema  Discharge Instructions You were cared for by a hospitalist during your hospital stay. If you have any questions about your discharge medications or the care you received while you were in the hospital after you are discharged, you can call the unit and asked to speak with the hospitalist on call if the hospitalist that took care of you is not available. Once you are discharged, your primary care physician will handle any further medical issues. Please note that NO REFILLS for any discharge medications will be authorized once you are discharged, as it is imperative that you return to your primary care physician (or establish a relationship with a primary care physician if you do not have one) for your aftercare needs so that they can reassess your need for medications and monitor your lab values.     Medication List    ASK your doctor about these medications        albuterol (2.5 MG/3ML) 0.083% nebulizer solution  Commonly known as:  PROVENTIL  Take 2.5 mg by nebulization every 8 (eight) hours as needed for wheezing or shortness of breath.     albuterol 108 (90 BASE) MCG/ACT inhaler  Commonly known as:  PROAIR HFA  Inhale 2 puffs into the lungs every 6 (six) hours as needed for wheezing or shortness of breath.     albuterol (2.5 MG/3ML) 0.083% nebulizer solution  Commonly known as:  PROVENTIL  INHALE THE CONTENTS OF 1 VIAL USING THE NEBULIZER EVERY 6 HOURS AS NEEDED     allopurinol 300 MG tablet  Commonly known as:  ZYLOPRIM  Take 1 tablet (300 mg total) by mouth daily.     ALPRAZolam 0.25 MG tablet  Commonly known as:  XANAX  Take 0.25 mg by mouth daily as needed for anxiety.     brimonidine-timolol 0.2-0.5 % ophthalmic solution  Commonly known as:  COMBIGAN  Place 1 drop into both eyes every 12 (twelve) hours.     budesonide-formoterol 160-4.5  MCG/ACT inhaler  Commonly known as:  SYMBICORT  Inhale 2 puffs into the lungs 2 (two) times daily.     diltiazem 180 MG 24 hr capsule  Commonly known as:  CARDIZEM CD  Take 1 capsule (180 mg total) by mouth daily.     mirtazapine 15 MG tablet  Commonly known as:  REMERON  Take 1 tablet (15 mg total) by mouth at bedtime.     montelukast 10 MG tablet  Commonly known as:  SINGULAIR  Take 10 mg by mouth at bedtime.     potassium chloride 10 MEQ tablet  Commonly known as:  K-DUR  Take 10 mEq by mouth 2 (two) times daily.     tiotropium 18 MCG inhalation capsule  Commonly known as:  SPIRIVA  Place 18 mcg into inhaler and inhale daily.     torsemide 20 MG tablet  Commonly known as:  DEMADEX  Take 2 tablets (40 mg total) by mouth daily.     traMADol 50 MG tablet  Commonly known as:  ULTRAM  Take 50 mg by mouth daily as needed (pain).     traMADol 50 MG tablet  Commonly known as:  ULTRAM  TAKE  1 TABLET BY MOUTH EVERY DAY AS NEEDED     travoprost (benzalkonium) 0.004 % ophthalmic solution  Commonly known as:  TRAVATAN  Place 1 drop into both eyes at bedtime.     traZODone 50 MG tablet  Commonly known as:  DESYREL  Take 0.5-1 tablets (25-50 mg total) by mouth at bedtime as needed for sleep.  Start taking on:  03/13/2014     warfarin 4 MG tablet  Commonly known as:  COUMADIN  Take 1.5 tabs (6 mg) on Monday and Thursday and 1 tab (4mg ) on other days       Allergies  Allergen Reactions  . Aspirin Other (See Comments)    On coumadin   Follow-up Information    Follow up with Rolm Bookbinder, MD On 03/19/2014.   Specialty:  General Surgery   Why:  4:10pm, arrive no later than 3:40pm for paperwork   Contact information:   [address] [place] [address] [phone]        The results of significant diagnostics from this hospitalization (including imaging, microbiology, ancillary and laboratory) are listed below for reference.    Significant  Diagnostic Studies: Ct Abdomen Pelvis Wo Contrast  02/14/2014   CLINICAL DATA:  Perforated appendicitis with abscess. Re-evaluate for possible drainage  EXAM: CT ABDOMEN AND PELVIS WITHOUT CONTRAST  TECHNIQUE: Multidetector CT imaging of the abdomen and pelvis was performed following the standard protocol without IV contrast.  COMPARISON:  02/10/2014  FINDINGS: Lower chest: Bibasilar dependent bronchovascular nodular opacities could represent pneumonia or aspiration. Trace pleural effusions present. Heart is enlarged. Atherosclerosis of the lower thoracic aorta. No pericardial effusion. NG tube enters the stomach.  Abdomen: Small amount of perihepatic ascites. Liver, collapsed gallbladder, biliary system, or atrophic pancreas, spleen, adrenal glands, and kidneys are within normal limits for age and noncontrast imaging.  Oral contrast present throughout small and large bowel. Negative for obstruction or ileus.  No free air evident.  In the right lower quadrant, there is a slightly larger fluid collection with tiny gas bubbles inferior to the cecum measuring 5.1 x 4.1 cm. This would be amenable to percutaneous drainage. Slight thickening of the adjacent small bowel, suspect reactive enteritis. Findings compatible with perforated appendicitis and right lower quadrant abscess.  Pelvis: No significant pelvic fluid collection, free fluid, hemorrhage, abscess, adenopathy, or inguinal abnormality. Small fat containing right inguinal hernia noted. Prostate gland is mildly enlarged.  Diffuse degenerative changes noted of the spine. No acute osseous finding.  IMPRESSION: Slight enlargement of the right lower quadrant pericecal air-fluid collection measuring 5.1 x 4.1 cm compatible with an abscess related to perforated appendicitis. This would be amenable to CT percutaneous drainage.  Adjacent small bowel enteritis in the right lower quadrant.  Negative for obstruction or free air.   Electronically Signed   By: Daryll Brod  M.D.   On: 02/14/2014 13:45   Dg Chest 2 View  02/13/2014   CLINICAL DATA:  Chest pain, cough, shortness of Breath  EXAM: CHEST  2 VIEW  COMPARISON:  02/11/2014  FINDINGS: Cardiomediastinal silhouette is stable. NG tube in place with tip in mid stomach. Contrast material from recent CT scan noted within left colon. Mild left basilar atelectasis. Mild degenerative changes thoracic spine. No acute infiltrate or pulmonary edema.  IMPRESSION: No acute infiltrate or pulmonary edema. Mild left basilar atelectasis. NG tube in place.   Electronically Signed   By: Lahoma Crocker M.D.   On: 02/13/2014 11:08   Dg Abd 1  View  02/11/2014   CLINICAL DATA:  Initial evaluation for abdominal distention  EXAM: ABDOMEN - 1 VIEW  COMPARISON:  02/10/2014 CT scan  FINDINGS: IV contrast is seen opacifying the bladder. There is significant gaseous distention of the stomach. There is mild small bowel dilatation in the midline to 4.6 cm. There is air and stool into the colon.  IMPRESSION: Findings most consistent with ileus. There is gaseous distention of the stomach.   Electronically Signed   By: Skipper Cliche M.D.   On: 02/11/2014 15:54   Ct Abdomen Pelvis W Contrast  02/20/2014   CLINICAL DATA:  Ruptured appendicitis  EXAM: CT ABDOMEN AND PELVIS WITH CONTRAST  TECHNIQUE: Multidetector CT imaging of the abdomen and pelvis was performed using the standard protocol following bolus administration of intravenous contrast.  CONTRAST:  164mL OMNIPAQUE IOHEXOL 300 MG/ML  SOLN  COMPARISON:  02/15/2014  FINDINGS: Lower chest: Moderate cardiac enlargement. Moderate bilateral pleural effusions are identified. Atelectasis is noted within both lower lobes.  Hepatobiliary: Reflux of contrast material from the right side of heart into the hepatic veins identified compatible with passive venous congestion of the liver. No focal liver abnormality noted. Gallbladder is negative. No biliary dilatation.  Pancreas: Appears normal.  Spleen: Normal.   Adrenals/Urinary Tract: The adrenal glands are both normal. Unremarkable appearance of the kidneys an urinary bladder.  Stomach/Bowel: The stomach appears normal. There is an NG tube within the body of stomach. The small bowel loops are mildly increased in caliber measuring up to 3.7 cm. There is enteric contrast material throughout the small and large bowel loops up to the level of the rectum. Improvement in distal small bowel wall thickening. Right lower quadrant pigtail drainage catheter is identified. There has been resolution of the periappendiceal abscess. No new fluid collections identified.  Vascular/Lymphatic: Calcified atherosclerotic disease involves the abdominal aorta. No aneurysm. No enlarged retroperitoneal or mesenteric adenopathy. No enlarged pelvic or inguinal lymph nodes.  Reproductive: Prostate gland and seminal vesicles appear normal.  Other: Moderate abdominal and pelvic ascites identified.  Musculoskeletal: Multi level degenerative disc disease noted throughout the lower lumbar spine. No aggressive lytic or sclerotic bone lesions identified. There are bilateral L5 pars defects noted.  IMPRESSION: 1. Resolution of right lower quadrant abscess status post drainage placement. 2. Moderate abdominal and pelvic ascites. There is also moderate bilateral pleural effusions and reflux of contrast material from the right atrium into the hepatic veins. Findings are concerning for moderate right heart failure. 3. Suspect continued small bowel enteritis with mild increase caliber of small bowel loops without evidence for bowel obstruction.   Electronically Signed   By: Kerby Moors M.D.   On: 02/20/2014 14:10   Ct Abdomen Pelvis W Contrast  02/10/2014   CLINICAL DATA:  73 year old with left lower quadrant pain and tenderness  EXAM: CT ABDOMEN AND PELVIS WITH CONTRAST  TECHNIQUE: Multidetector CT imaging of the abdomen and pelvis was performed using the standard protocol following bolus administration  of intravenous contrast.  CONTRAST:  22mL OMNIPAQUE IOHEXOL 300 MG/ML SOLN, 16mL OMNIPAQUE IOHEXOL 300 MG/ML SOLN  COMPARISON:  None.  FINDINGS: Chest:The partially visualized chest demonstrates an enlarged heart, specifically the right atrium. Bronchiectatic changes are noted within the lung bases, associated areas of linear consolidation are also present in the lung bases. There is no pleural effusion.  Liver: No focal hepatic masses identified. There is no intrahepatic biliary ductal dilatation.  Gallbladder: Unremarkable.  Spleen: Unremarkable.  Pancreas: Unremarkable.  Adrenal glands: Unremarkable.  Kidneys: There is no nephrolithiasis or hydronephrosis. No suspicious renal mass is seen.  Bowel/gastrointestinal tract: Diffuse inflammatory fat stranding is noted within the right lower quadrant. A small rim enhancing fluid collection is also present within the right lower quadrant measuring approximately 4.5 x 3.2 cm (series 2, image 53). Tiny foci of free air are noted just anterior to this fluid collection within the right lower quadrant. An adjacent loop of small bowel with diffuse wall thickening and wall edema is present. The appendix is not identified. This constellation of finding is concerning for a a ruptured appendicitis.  Pelvis: There is no pelvic mass. The prostate gland is enlarged and measures approximately 5.0 x 4.0 cm.  Miscellaneous: Scattered vascular calcifications are present.  Osseous structures: Multilevel degenerative changes of the spine are noted.  IMPRESSION: 1. Findings are concerning for ruptured appendicitis with an associated approximately 4.5 x 3.2 cm abscess as detailed above. An adjacent loop of small bowel also demonstrates associated secondary enteritis. These results were called by telephone at the time of interpretation on 02/10/2014 at 1:25 pm to Dr. Milton Ferguson , who verbally acknowledged these results.  2. Bilateral lower lobe bronchiectatic changes and consolidation are  suspicious for chronic aspiration pneumonitis.  3.  Cardiac enlargement, specifically involving the right atrium.   Electronically Signed   By: Rosemarie Ax   On: 02/10/2014 13:32   Dg Chest Port 1 View  02/26/2014   CLINICAL DATA:  Pulmonary edema  EXAM: PORTABLE CHEST - 1 VIEW  COMPARISON:  02/13/2014  FINDINGS: Cardiomegaly is noted. Central mild vascular congestion without convincing pulmonary edema. Bilateral small pleural effusion with bilateral basilar atelectasis or infiltrate.  IMPRESSION: Cardiomegaly. Central mild vascular congestion without convincing pulmonary edema. Bilateral small pleural effusion with bilateral basilar atelectasis or infiltrate.   Electronically Signed   By: Lahoma Crocker M.D.   On: 02/26/2014 10:06   Dg Chest Port 1 View  02/11/2014   CLINICAL DATA:  Under show evaluation for dyspnea, history of hypertension asthma and bronchitis  EXAM: PORTABLE CHEST - 1 VIEW  COMPARISON:  11/19/2013  FINDINGS: Severe cardiac enlargement. Mild vascular congestion. No evidence of edema infiltrate or consolidation. No pleural effusion.  IMPRESSION: Stable severe cardiac enlargement   Electronically Signed   By: Skipper Cliche M.D.   On: 02/11/2014 13:42   Dg Abd Portable 1v  02/18/2014   CLINICAL DATA:  Acute lower abdominal pain and abdominal distention. Right lower quadrant percutaneous abscess drainage 3 days ago, likely secondary to appendiceal rupture.  EXAM: PORTABLE ABDOMEN - 1 VIEW  COMPARISON:  CT abdomen at time of abscess drainage 02/15/2014. Unenhanced CT abdomen and pelvis 02/14/2014, 02/10/2014. One-view abdomen x-ray 02/12/2014, 02/11/2014.  FINDINGS: Abscess drainage catheter in the right lower quadrant. Persistent moderate to marked gaseous distention of a dilated loop of jejunum in the left upper quadrant. Remainder of the small bowel of normal caliber. Moderate gaseous distention of the stomach with interval nasogastric tube removal. Oral contrast material from the CT 4  days ago remains in the normal caliber colon. No suggestion of free air on the supine image. Airspace consolidation with air bronchograms medially in the visualized lower lobes, unchanged  IMPRESSION: 1. Stable moderate to marked dilation of a loop of jejunum in the left upper quadrant. As there was no transition point on the prior CT, this likely reflects a localized ileus or atonic segment. 2. Moderate gaseous distention of the stomach with interval NG tube removal. 3. Stable atelectasis versus pneumonia  in the medial lower lobes bilaterally.   Electronically Signed   By: Evangeline Dakin M.D.   On: 02/18/2014 10:44   Dg Abd Portable 1v  02/12/2014   CLINICAL DATA:  NG tube placement  EXAM: PORTABLE ABDOMEN - 1 VIEW  COMPARISON:  04/2013  FINDINGS: NG tube has been inserted into the proximal stomach. Minimal decrease in gastric distention. Dilated small bowel persists in the mid abdomen with a small bowel diameter of 5.8 cm. Contrast within the colon on the right. Heart appears enlarged. Left lower lobe streaky airspace process compatible with pneumonia or aspiration.  IMPRESSION: NGT within stomach. Minimal improving gastric distention and Stable small bowel distention.   Electronically Signed   By: Daryll Brod M.D.   On: 02/12/2014 14:33   Ct Image Guided Drainage By Percutaneous Catheter  02/15/2014   CLINICAL DATA:  Right lower quadrant abscess  EXAM: CT IMAGE GUIDED DRAINAGE BY PERCUTANEOUS CATHETER  FLUOROSCOPY TIME:  Min  MEDICATIONS AND MEDICAL HISTORY: Versed 0.5 mg, Fentanyl 25 mcg.  Additional Medications: None.  ANESTHESIA/SEDATION: Moderate sedation time: 15 minutes  CONTRAST:  Min  PROCEDURE: The procedure, risks, benefits, and alternatives were explained to the patient. Questions regarding the procedure were encouraged and answered. The patient understands and consents to the procedure.  The right lower quadrant was prepped with Betadine in a sterile fashion, and a sterile drape was applied  covering the operative field. A sterile gown and sterile gloves were used for the procedure.  Under CT guidance, an 18 gauge to inserted into the right lower quadrant abscess and removed over an Amplatz. A 12 French dilator followed by a 12 [place] drain were inserted. It was looped and string fixed in the abscess. 15 cc pus was aspirated.  FINDINGS: Images document 33 French drain placement into a right lower quadrant abscess.  COMPLICATIONS: None  IMPRESSION: Successful right lower quadrant abscess drain.   Electronically Signed   By: Maryclare Bean M.D.   On: 02/15/2014 11:45    Microbiology: No results found for this or any previous visit (from the past 240 hour(s)).   Labs: Basic Metabolic Panel:  Recent Labs Lab 02/20/14 0639  02/21/14 0436 02/22/14 0345 02/24/14 0431 02/25/14 1846 02/26/14 0448  NA  --   < > 141 143 142 141 142  K  --   < > 4.1 3.6* 3.1* 3.9 3.9  CL  --   < > 104 99 99 98 98  CO2  --   < > 25 32 33* 34* 34*  GLUCOSE  --   < > 98 78 86 74 88  BUN  --   < > 8 6 4* 6 6  CREATININE  --   < > 0.86 0.90 0.93 0.82 0.88  CALCIUM  --   < > 8.7 8.8 8.2* 8.6 8.7  MG 2.0  --   --   --   --  1.4*  --   < > = values in this interval not displayed. Liver Function Tests:  Recent Labs Lab 02/24/14 0431  AST 15  ALT 6   No results for input(s): LIPASE, AMYLASE in the last 168 hours. No results for input(s): AMMONIA in the last 168 hours. CBC:  Recent Labs Lab 02/21/14 0436 02/22/14 0345 02/23/14 0525 02/24/14 0431 02/25/14 0447  WBC 11.8* 11.4* 8.4 6.3 7.0  HGB 7.5* 7.5* 7.3* 7.2* 7.3*  HCT 23.8* 24.1* 23.2* 23.1* 23.8*  MCV 87.5 88.6 88.5 88.5 89.8  PLT  353 372 375 354 342   Cardiac Enzymes: No results for input(s): CKTOTAL, CKMB, CKMBINDEX, TROPONINI in the last 168 hours. BNP: BNP (last 3 results)  Recent Labs  06/16/13 1554 06/17/13 0537 07/09/13 0555  PROBNP 3560.0* 3222.0* 3842.0*   CBG: No results for input(s): GLUCAP in the last 168  hours.  Results for FOUAD, TAUL (MRN [id number]) as of 02/26/2014 11:21  Ref. Range 06/18/2013 05:28  Iron Latest Range: 42-165 ug/dL 33 (L)  UIBC Latest Range: 125-400 ug/dL 321  TIBC Latest Range: 215-435 ug/dL 354  Saturation Ratios Latest Range: 20-55 % 9 (L)  Ferritin Latest Range: 22-322 ng/mL 60  Transferrin Latest Range: 200-360 mg/dL 267  RBC Folate Latest Range: >280 ng/mL 863 (H)     SignedDebbe Odea, MD [facility] 02/26/2014, 11:24 AM

## 2014-02-28 ENCOUNTER — Other Ambulatory Visit: Payer: Self-pay | Admitting: Family Medicine

## 2014-03-02 ENCOUNTER — Telehealth: Payer: Self-pay | Admitting: Cardiovascular Disease

## 2014-03-02 ENCOUNTER — Other Ambulatory Visit: Payer: Self-pay

## 2014-03-02 MED ORDER — ALBUTEROL SULFATE HFA 108 (90 BASE) MCG/ACT IN AERS: 2.0000 | INHALATION_SPRAY | Freq: Four times a day (QID) | RESPIRATORY_TRACT | Status: AC | PRN

## 2014-03-02 MED ORDER — TIOTROPIUM BROMIDE MONOHYDRATE 18 MCG IN CAPS: 18.0000 ug | ORAL_CAPSULE | Freq: Every day | RESPIRATORY_TRACT | Status: AC

## 2014-03-02 MED ORDER — WARFARIN SODIUM 4 MG PO TABS: ORAL_TABLET | ORAL | Status: DC

## 2014-03-02 NOTE — Telephone Encounter (Signed)
Received fax refill request  Rx # [id number] Medication:  Warfarin Sod 4 mg tablets Qty 45 Sig:  Take one tablet by mouth every day except take 1 and one-half tablet by mouth on Monday and Thursdays as directed  Physician:  Bronson Ing

## 2014-03-03 ENCOUNTER — Ambulatory Visit (INDEPENDENT_AMBULATORY_CARE_PROVIDER_SITE_OTHER): Payer: Medicare Other | Admitting: *Deleted

## 2014-03-03 ENCOUNTER — Ambulatory Visit (INDEPENDENT_AMBULATORY_CARE_PROVIDER_SITE_OTHER): Payer: Medicare Other | Admitting: Family Medicine

## 2014-03-03 ENCOUNTER — Telehealth: Payer: Self-pay | Admitting: *Deleted

## 2014-03-03 ENCOUNTER — Encounter: Payer: Self-pay | Admitting: Family Medicine

## 2014-03-03 ENCOUNTER — Other Ambulatory Visit: Payer: Self-pay | Admitting: Family Medicine

## 2014-03-03 ENCOUNTER — Telehealth: Payer: Self-pay | Admitting: Cardiovascular Disease

## 2014-03-03 VITALS — BP 118/60 | HR 80 | Resp 17 | Ht 66.0 in | Wt 145.0 lb

## 2014-03-03 DIAGNOSIS — I482 Chronic atrial fibrillation, unspecified: Secondary | ICD-10-CM

## 2014-03-03 DIAGNOSIS — F418 Other specified anxiety disorders: Secondary | ICD-10-CM

## 2014-03-03 DIAGNOSIS — J418 Mixed simple and mucopurulent chronic bronchitis: Secondary | ICD-10-CM

## 2014-03-03 DIAGNOSIS — Z5181 Encounter for therapeutic drug level monitoring: Secondary | ICD-10-CM

## 2014-03-03 DIAGNOSIS — I4891 Unspecified atrial fibrillation: Secondary | ICD-10-CM

## 2014-03-03 DIAGNOSIS — Z09 Encounter for follow-up examination after completed treatment for conditions other than malignant neoplasm: Secondary | ICD-10-CM

## 2014-03-03 DIAGNOSIS — F17210 Nicotine dependence, cigarettes, uncomplicated: Secondary | ICD-10-CM

## 2014-03-03 DIAGNOSIS — K353 Acute appendicitis with localized peritonitis: Secondary | ICD-10-CM

## 2014-03-03 DIAGNOSIS — K3533 Acute appendicitis with perforation and localized peritonitis, with abscess: Secondary | ICD-10-CM

## 2014-03-03 DIAGNOSIS — I10 Essential (primary) hypertension: Secondary | ICD-10-CM

## 2014-03-03 DIAGNOSIS — Z720 Tobacco use: Secondary | ICD-10-CM

## 2014-03-03 DIAGNOSIS — K5900 Constipation, unspecified: Secondary | ICD-10-CM

## 2014-03-03 LAB — POCT INR: INR: 2

## 2014-03-03 MED ORDER — WARFARIN SODIUM 4 MG PO TABS: ORAL_TABLET | ORAL | Status: AC

## 2014-03-03 MED ORDER — SENNA 8.6 MG PO TABS: 1.0000 | ORAL_TABLET | Freq: Two times a day (BID) | ORAL | Status: DC

## 2014-03-03 NOTE — Telephone Encounter (Signed)
See coumadin note. 

## 2014-03-03 NOTE — Telephone Encounter (Signed)
inr 2.0 Pt 23.8

## 2014-03-03 NOTE — Patient Instructions (Addendum)
F/u as before  CBc and diff 3 days before next visit  New to help with regular BM is stool softener, twice daily, so you do not strain with BM   Plan on stopping all smoking by Jan 1, this will be good for you  THANKFUL you are much better  All the best for holiday season and 2016!

## 2014-03-03 NOTE — Telephone Encounter (Signed)
Received fax refill request  Rx # [id number] Medication:  Warfarin Sod 4 mg tablets Qty 45 Sig:  Take one tablet by mouth every day except take one and one half tablet by mouth on Monday and Thursdays as directed Physician:  Bronson Ing

## 2014-03-08 DIAGNOSIS — K5900 Constipation, unspecified: Secondary | ICD-10-CM | POA: Insufficient documentation

## 2014-03-08 DIAGNOSIS — Z09 Encounter for follow-up examination after completed treatment for conditions other than malignant neoplasm: Secondary | ICD-10-CM | POA: Insufficient documentation

## 2014-03-08 NOTE — Assessment & Plan Note (Signed)
Controlled, no change in medication  

## 2014-03-08 NOTE — Assessment & Plan Note (Signed)
continues to smoke approx 3 per day, trying to quit, hopefully by New year  Patient counseled for approximately 5 minutes regarding the health risks of ongoing nicotine use, specifically all types of cancer, heart disease, stroke and respiratory failure. The options available for help with cessation ,the behavioral changes to assist the process, and the option to either gradully reduce usage  Or abruptly stop.is also discussed. Pt is also encouraged to set specific goals in number of cigarettes used daily, as well as to set a quit date.

## 2014-03-08 NOTE — Assessment & Plan Note (Signed)
High fiber diet and use of daily softener, as well as daily physical activity discussed

## 2014-03-08 NOTE — Assessment & Plan Note (Signed)
Stable, no med change, needs  To quit nicotine

## 2014-03-08 NOTE — Assessment & Plan Note (Signed)
Currently rate controlled, anticoagulation being followed by coumadin clinic

## 2014-03-08 NOTE — Progress Notes (Signed)
   Subjective:    Patient ID: Theodore [name], male    DOB: 09-24-1940, 73 y.o.   MRN: [id number]  HPI Pt in for re   Evaluation following most recent hospitalization for perforated appendix. Appetite is coming back slowly, he notes constipation , needing to strain at small amounts of stool, denies emesis, fever or chills, states food has little taste or appeal. Managed non operatively with drainage and antibiotics   Review of Systems See HPI Denies recent fever or chills. Denies sinus pressure, nasal congestion, ear pain or sore throat. C/o chronic  chest congestion,  cough and  wheezing. Denies chest pains, palpitations and leg swelling .   Denies dysuria, frequency, hesitancy or incontinence. Denies joint pain, swelling and limitation in mobility. Denies headaches, seizures, numbness, or tingling. Denies uncontrolled depression, anxiety or insomnia. Denies skin break down or rash.        Objective:   Physical Exam BP 118/60 mmHg  Pulse 80  Resp 17  Ht 5\' 6"  (1.676 m)  Wt 145 lb (65.772 kg)  BMI 23.41 kg/m2  SpO2 90% Patient alert and oriented and in no cardiopulmonary distress.  HEENT: No facial asymmetry, EOMI,   oropharynx pink and moist.  Neck supple no JVD, no mass.  Chest: Clear to auscultation bilaterally.Markedly reduced air entry throiughout  CVS: S1, S2 no murmurs, no S3.Regular rate.  ABD: Distended,Soft non tender. No guarding or rebound. Normal BS  Ext: No edema  MS: Adequate ROM spine, shoulders, hips and knees.  Skin: Intact, no ulcerations or rash noted.  Psych: Good eye contact, normal affect. Memory intact not anxious or depressed appearing.  CNS: CN 2-12 intact, power,  normal throughout.no focal deficits noted.        Assessment & Plan:  Appendicitis with abscess Recently hospitalized with perforated appendix, fortunately contained in an abcess , and drained percutaneously and treated with IV antibiotics, much improved, however  appetite is still recovering  Tobacco abuse disorder continues to smoke approx 3 per day, trying to quit, hopefully by New year  Patient counseled for approximately 5 minutes regarding the health risks of ongoing nicotine use, specifically all types of cancer, heart disease, stroke and respiratory failure. The options available for help with cessation ,the behavioral changes to assist the process, and the option to either gradully reduce usage  Or abruptly stop.is also discussed. Pt is also encouraged to set specific goals in number of cigarettes used daily, as well as to set a quit date.   Depression with anxiety Controlled, no change in medication Improved since unexpected loss of spouse earlier this year  COPD (chronic obstructive pulmonary disease) Stable, no med change, needs  To quit nicotine  Essential hypertension Controlled, no change in medication   Atrial fibrillation Currently rate controlled, anticoagulation being followed by coumadin clinic  Constipation High fiber diet and use of daily softener, as well as daily physical activity discussed  Hospital discharge follow-up Improving gradually, regaining appetite and strength, both still less than previously H/H checking on pt for c/p as well as GI standpoint also o obtain blood fo coumadin management, currently being bridged, no complications of excessive bleed noted Rept CBC and chem 7 in next 2 to 3 week

## 2014-03-08 NOTE — Assessment & Plan Note (Signed)
Recently hospitalized with perforated appendix, fortunately contained in an abcess , and drained percutaneously and treated with IV antibiotics, much improved, however appetite is still recovering

## 2014-03-08 NOTE — Assessment & Plan Note (Signed)
Controlled, no change in medication Improved since unexpected loss of spouse earlier this year

## 2014-03-08 NOTE — Assessment & Plan Note (Addendum)
Improving gradually, regaining appetite and strength, both still less than previously H/H checking on pt for c/p as well as GI standpoint also o obtain blood fo coumadin management, currently being bridged, no complications of excessive bleed noted Rept CBC and chem 7 in next 2 to 3 week

## 2014-03-09 ENCOUNTER — Telehealth: Payer: Self-pay

## 2014-03-09 ENCOUNTER — Emergency Department (HOSPITAL_COMMUNITY): Payer: Medicare Other

## 2014-03-09 ENCOUNTER — Inpatient Hospital Stay (HOSPITAL_COMMUNITY): Payer: Medicare Other

## 2014-03-09 ENCOUNTER — Ambulatory Visit (INDEPENDENT_AMBULATORY_CARE_PROVIDER_SITE_OTHER): Payer: Medicare Other | Admitting: *Deleted

## 2014-03-09 ENCOUNTER — Inpatient Hospital Stay (HOSPITAL_COMMUNITY)
Admission: EM | Admit: 2014-03-09 | Discharge: 2014-03-18 | DRG: 393 | Disposition: A | Discharge status: Home Health | Payer: Medicare Other | Attending: Internal Medicine | Admitting: Internal Medicine

## 2014-03-09 ENCOUNTER — Encounter (HOSPITAL_COMMUNITY): Payer: Self-pay

## 2014-03-09 DIAGNOSIS — R54 Age-related physical debility: Secondary | ICD-10-CM | POA: Diagnosis not present

## 2014-03-09 DIAGNOSIS — Z886 Allergy status to analgesic agent status: Secondary | ICD-10-CM

## 2014-03-09 DIAGNOSIS — I482 Chronic atrial fibrillation, unspecified: Secondary | ICD-10-CM | POA: Insufficient documentation

## 2014-03-09 DIAGNOSIS — R791 Abnormal coagulation profile: Secondary | ICD-10-CM | POA: Diagnosis not present

## 2014-03-09 DIAGNOSIS — R140 Abdominal distension (gaseous): Secondary | ICD-10-CM

## 2014-03-09 DIAGNOSIS — Z6823 Body mass index (BMI) 23.0-23.9, adult: Secondary | ICD-10-CM | POA: Diagnosis not present

## 2014-03-09 DIAGNOSIS — D509 Iron deficiency anemia, unspecified: Secondary | ICD-10-CM | POA: Diagnosis not present

## 2014-03-09 DIAGNOSIS — Z7901 Long term (current) use of anticoagulants: Secondary | ICD-10-CM | POA: Diagnosis not present

## 2014-03-09 DIAGNOSIS — K567 Ileus, unspecified: Secondary | ICD-10-CM

## 2014-03-09 DIAGNOSIS — G9341 Metabolic encephalopathy: Secondary | ICD-10-CM | POA: Diagnosis not present

## 2014-03-09 DIAGNOSIS — A419 Sepsis, unspecified organism: Secondary | ICD-10-CM | POA: Diagnosis present

## 2014-03-09 DIAGNOSIS — H409 Unspecified glaucoma: Secondary | ICD-10-CM | POA: Diagnosis present

## 2014-03-09 DIAGNOSIS — R7989 Other specified abnormal findings of blood chemistry: Secondary | ICD-10-CM | POA: Insufficient documentation

## 2014-03-09 DIAGNOSIS — K5900 Constipation, unspecified: Secondary | ICD-10-CM | POA: Diagnosis present

## 2014-03-09 DIAGNOSIS — J9601 Acute respiratory failure with hypoxia: Secondary | ICD-10-CM | POA: Insufficient documentation

## 2014-03-09 DIAGNOSIS — R748 Abnormal levels of other serum enzymes: Secondary | ICD-10-CM | POA: Diagnosis present

## 2014-03-09 DIAGNOSIS — K353 Acute appendicitis with localized peritonitis: Secondary | ICD-10-CM | POA: Diagnosis present

## 2014-03-09 DIAGNOSIS — F17210 Nicotine dependence, cigarettes, uncomplicated: Secondary | ICD-10-CM | POA: Diagnosis present

## 2014-03-09 DIAGNOSIS — I2510 Atherosclerotic heart disease of native coronary artery without angina pectoris: Secondary | ICD-10-CM | POA: Diagnosis present

## 2014-03-09 DIAGNOSIS — J90 Pleural effusion, not elsewhere classified: Secondary | ICD-10-CM | POA: Diagnosis not present

## 2014-03-09 DIAGNOSIS — I5033 Acute on chronic diastolic (congestive) heart failure: Secondary | ICD-10-CM | POA: Diagnosis not present

## 2014-03-09 DIAGNOSIS — I272 Other secondary pulmonary hypertension: Secondary | ICD-10-CM | POA: Diagnosis present

## 2014-03-09 DIAGNOSIS — Z7951 Long term (current) use of inhaled steroids: Secondary | ICD-10-CM | POA: Diagnosis not present

## 2014-03-09 DIAGNOSIS — R599 Enlarged lymph nodes, unspecified: Secondary | ICD-10-CM | POA: Diagnosis not present

## 2014-03-09 DIAGNOSIS — I10 Essential (primary) hypertension: Secondary | ICD-10-CM | POA: Diagnosis present

## 2014-03-09 DIAGNOSIS — J9622 Acute and chronic respiratory failure with hypercapnia: Secondary | ICD-10-CM | POA: Diagnosis not present

## 2014-03-09 DIAGNOSIS — K913 Postprocedural intestinal obstruction: Secondary | ICD-10-CM | POA: Diagnosis present

## 2014-03-09 DIAGNOSIS — Z5181 Encounter for therapeutic drug level monitoring: Secondary | ICD-10-CM

## 2014-03-09 DIAGNOSIS — R0602 Shortness of breath: Secondary | ICD-10-CM | POA: Diagnosis not present

## 2014-03-09 DIAGNOSIS — G9340 Encephalopathy, unspecified: Secondary | ICD-10-CM

## 2014-03-09 DIAGNOSIS — R64 Cachexia: Secondary | ICD-10-CM | POA: Diagnosis present

## 2014-03-09 DIAGNOSIS — J9621 Acute and chronic respiratory failure with hypoxia: Secondary | ICD-10-CM | POA: Diagnosis not present

## 2014-03-09 DIAGNOSIS — J441 Chronic obstructive pulmonary disease with (acute) exacerbation: Secondary | ICD-10-CM | POA: Diagnosis not present

## 2014-03-09 DIAGNOSIS — J45909 Unspecified asthma, uncomplicated: Secondary | ICD-10-CM | POA: Diagnosis not present

## 2014-03-09 DIAGNOSIS — K5660 Unspecified intestinal obstruction: Secondary | ICD-10-CM | POA: Diagnosis not present

## 2014-03-09 DIAGNOSIS — F411 Generalized anxiety disorder: Secondary | ICD-10-CM | POA: Diagnosis not present

## 2014-03-09 DIAGNOSIS — G4700 Insomnia, unspecified: Secondary | ICD-10-CM | POA: Diagnosis not present

## 2014-03-09 DIAGNOSIS — N179 Acute kidney failure, unspecified: Secondary | ICD-10-CM | POA: Diagnosis not present

## 2014-03-09 DIAGNOSIS — D62 Acute posthemorrhagic anemia: Secondary | ICD-10-CM | POA: Diagnosis not present

## 2014-03-09 DIAGNOSIS — I369 Nonrheumatic tricuspid valve disorder, unspecified: Secondary | ICD-10-CM | POA: Diagnosis not present

## 2014-03-09 DIAGNOSIS — I270 Primary pulmonary hypertension: Secondary | ICD-10-CM | POA: Diagnosis not present

## 2014-03-09 DIAGNOSIS — Y838 Other surgical procedures as the cause of abnormal reaction of the patient, or of later complication, without mention of misadventure at the time of the procedure: Secondary | ICD-10-CM | POA: Diagnosis present

## 2014-03-09 DIAGNOSIS — I4891 Unspecified atrial fibrillation: Secondary | ICD-10-CM

## 2014-03-09 DIAGNOSIS — J9690 Respiratory failure, unspecified, unspecified whether with hypoxia or hypercapnia: Secondary | ICD-10-CM

## 2014-03-09 DIAGNOSIS — I509 Heart failure, unspecified: Secondary | ICD-10-CM | POA: Diagnosis not present

## 2014-03-09 DIAGNOSIS — I5032 Chronic diastolic (congestive) heart failure: Secondary | ICD-10-CM

## 2014-03-09 DIAGNOSIS — D689 Coagulation defect, unspecified: Secondary | ICD-10-CM | POA: Insufficient documentation

## 2014-03-09 DIAGNOSIS — K3533 Acute appendicitis with perforation and localized peritonitis, with abscess: Secondary | ICD-10-CM | POA: Insufficient documentation

## 2014-03-09 DIAGNOSIS — D638 Anemia in other chronic diseases classified elsewhere: Secondary | ICD-10-CM | POA: Diagnosis not present

## 2014-03-09 DIAGNOSIS — E874 Mixed disorder of acid-base balance: Secondary | ICD-10-CM | POA: Diagnosis not present

## 2014-03-09 DIAGNOSIS — J9602 Acute respiratory failure with hypercapnia: Secondary | ICD-10-CM

## 2014-03-09 DIAGNOSIS — M109 Gout, unspecified: Secondary | ICD-10-CM | POA: Diagnosis present

## 2014-03-09 DIAGNOSIS — K56609 Unspecified intestinal obstruction, unspecified as to partial versus complete obstruction: Secondary | ICD-10-CM | POA: Diagnosis present

## 2014-03-09 DIAGNOSIS — N19 Unspecified kidney failure: Secondary | ICD-10-CM

## 2014-03-09 DIAGNOSIS — R778 Other specified abnormalities of plasma proteins: Secondary | ICD-10-CM | POA: Insufficient documentation

## 2014-03-09 LAB — CBC WITH DIFFERENTIAL/PLATELET
Basophils Absolute: 0 10*3/uL (ref 0.0–0.1)
Basophils Relative: 0 % (ref 0–1)
EOS PCT: 0 % (ref 0–5)
Eosinophils Absolute: 0 10*3/uL (ref 0.0–0.7)
HCT: 30.1 % — ABNORMAL LOW (ref 39.0–52.0)
Hemoglobin: 9.1 g/dL — ABNORMAL LOW (ref 13.0–17.0)
LYMPHS ABS: 0.5 10*3/uL — AB (ref 0.7–4.0)
LYMPHS PCT: 6 % — AB (ref 12–46)
MCH: 27.8 pg (ref 26.0–34.0)
MCHC: 30.2 g/dL (ref 30.0–36.0)
MCV: 92 fL (ref 78.0–100.0)
Monocytes Absolute: 1.2 10*3/uL — ABNORMAL HIGH (ref 0.1–1.0)
Monocytes Relative: 13 % — ABNORMAL HIGH (ref 3–12)
Neutro Abs: 7 10*3/uL (ref 1.7–7.7)
Neutrophils Relative %: 81 % — ABNORMAL HIGH (ref 43–77)
PLATELETS: 343 10*3/uL (ref 150–400)
RBC: 3.27 MIL/uL — ABNORMAL LOW (ref 4.22–5.81)
RDW: 18.2 % — ABNORMAL HIGH (ref 11.5–15.5)
WBC: 8.8 10*3/uL (ref 4.0–10.5)

## 2014-03-09 LAB — TROPONIN I
Troponin I: 0.04 ng/mL — ABNORMAL HIGH (ref <0.031)
Troponin I: 0.04 ng/mL — ABNORMAL HIGH (ref <0.031)

## 2014-03-09 LAB — COMPREHENSIVE METABOLIC PANEL
ALT: 12 U/L (ref 0–53)
AST: 23 U/L (ref 0–37)
Albumin: 3.3 g/dL — ABNORMAL LOW (ref 3.5–5.2)
Alkaline Phosphatase: 96 U/L (ref 39–117)
Anion gap: 12 (ref 5–15)
BUN: 35 mg/dL — ABNORMAL HIGH (ref 6–23)
CALCIUM: 9.4 mg/dL (ref 8.4–10.5)
CO2: 37 mmol/L — ABNORMAL HIGH (ref 19–32)
Chloride: 91 mEq/L — ABNORMAL LOW (ref 96–112)
Creatinine, Ser: 2.27 mg/dL — ABNORMAL HIGH (ref 0.50–1.35)
GFR calc non Af Amer: 27 mL/min — ABNORMAL LOW (ref >90)
GFR, EST AFRICAN AMERICAN: 31 mL/min — AB (ref >90)
Glucose, Bld: 114 mg/dL — ABNORMAL HIGH (ref 70–99)
Potassium: 4.2 mmol/L (ref 3.5–5.1)
SODIUM: 140 mmol/L (ref 135–145)
Total Bilirubin: 1 mg/dL (ref 0.3–1.2)
Total Protein: 8.3 g/dL (ref 6.0–8.3)

## 2014-03-09 LAB — BLOOD GAS, ARTERIAL
Acid-Base Excess: 8.4 mmol/L — ABNORMAL HIGH (ref 0.0–2.0)
BICARBONATE: 35.8 meq/L — AB (ref 20.0–24.0)
Drawn by: 22223
O2 Content: 6 L/min
O2 Saturation: 97.3 %
PH ART: 7.23 — AB (ref 7.350–7.450)
PO2 ART: 116 mmHg — AB (ref 80.0–100.0)
Patient temperature: 37
TCO2: 35.2 mmol/L (ref 0–100)
pCO2 arterial: 88.6 mmHg (ref 35.0–45.0)

## 2014-03-09 LAB — PROTIME-INR
INR: 4.87 — AB (ref 0.00–1.49)
PROTHROMBIN TIME: 45.8 s — AB (ref 11.6–15.2)

## 2014-03-09 LAB — LIPASE, BLOOD: Lipase: 15 U/L (ref 11–59)

## 2014-03-09 LAB — POCT INR: INR: 7

## 2014-03-09 LAB — BRAIN NATRIURETIC PEPTIDE: B Natriuretic Peptide: 720 pg/mL — ABNORMAL HIGH (ref 0.0–100.0)

## 2014-03-09 LAB — LACTIC ACID, PLASMA: LACTIC ACID, VENOUS: 1.1 mmol/L (ref 0.5–2.2)

## 2014-03-09 MED ORDER — LEVALBUTEROL HCL 0.63 MG/3ML IN NEBU
0.6300 mg | INHALATION_SOLUTION | RESPIRATORY_TRACT | Status: DC
  Administered 2014-03-10: 0.63 mg via RESPIRATORY_TRACT
  Filled 2014-03-09: qty 3

## 2014-03-09 MED ORDER — LEVOFLOXACIN IN D5W 500 MG/100ML IV SOLN
500.0000 mg | INTRAVENOUS | Status: DC
  Administered 2014-03-09: 500 mg via INTRAVENOUS
  Filled 2014-03-09: qty 100

## 2014-03-09 MED ORDER — IPRATROPIUM-ALBUTEROL 0.5-2.5 (3) MG/3ML IN SOLN
3.0000 mL | Freq: Once | RESPIRATORY_TRACT | Status: AC
  Administered 2014-03-09: 3 mL via RESPIRATORY_TRACT
  Filled 2014-03-09: qty 3

## 2014-03-09 MED ORDER — IOHEXOL 300 MG/ML  SOLN
50.0000 mL | Freq: Once | INTRAMUSCULAR | Status: AC | PRN
  Administered 2014-03-09: 50 mL via ORAL

## 2014-03-09 MED ORDER — ALBUTEROL SULFATE (2.5 MG/3ML) 0.083% IN NEBU
INHALATION_SOLUTION | RESPIRATORY_TRACT | Status: AC
  Administered 2014-03-09: 2.5 mg
  Filled 2014-03-09: qty 3

## 2014-03-09 MED ORDER — METHYLPREDNISOLONE SODIUM SUCC 125 MG IJ SOLR
125.0000 mg | Freq: Two times a day (BID) | INTRAMUSCULAR | Status: DC
  Administered 2014-03-09: 125 mg via INTRAVENOUS
  Filled 2014-03-09: qty 2

## 2014-03-09 MED ORDER — SODIUM CHLORIDE 0.9 % IV SOLN
INTRAVENOUS | Status: DC
  Administered 2014-03-09: 17:00:00 via INTRAVENOUS

## 2014-03-09 MED ORDER — LEVALBUTEROL HCL 0.63 MG/3ML IN NEBU: 0.6300 mg | INHALATION_SOLUTION | RESPIRATORY_TRACT | Status: DC | PRN

## 2014-03-09 MED ORDER — DILTIAZEM HCL 100 MG IV SOLR
5.0000 mg/h | INTRAVENOUS | Status: DC
  Administered 2014-03-09 – 2014-03-13 (×6): 5 mg/h via INTRAVENOUS
  Filled 2014-03-09 (×6): qty 100

## 2014-03-09 MED ORDER — ONDANSETRON HCL 4 MG/2ML IJ SOLN
4.0000 mg | Freq: Once | INTRAMUSCULAR | Status: AC
  Administered 2014-03-09: 4 mg via INTRAVENOUS
  Filled 2014-03-09: qty 2

## 2014-03-09 MED ORDER — SODIUM CHLORIDE 0.9 % IJ SOLN
3.0000 mL | Freq: Two times a day (BID) | INTRAMUSCULAR | Status: DC
  Administered 2014-03-10: 3 mL via INTRAVENOUS
  Administered 2014-03-11: 10 mL via INTRAVENOUS
  Administered 2014-03-12 – 2014-03-17 (×9): 3 mL via INTRAVENOUS

## 2014-03-09 MED ORDER — SODIUM CHLORIDE 0.9 % IV SOLN
INTRAVENOUS | Status: DC
  Administered 2014-03-10: 19:00:00 via INTRAVENOUS
  Administered 2014-03-11: 125 mL/h via INTRAVENOUS
  Administered 2014-03-12: 125 mL via INTRAVENOUS
  Administered 2014-03-12 – 2014-03-13 (×3): via INTRAVENOUS

## 2014-03-09 NOTE — Progress Notes (Signed)
Patient placed on BIPAP after ABG CO 2 was 88;patient has went to CT for head scan.

## 2014-03-09 NOTE — H&P (Addendum)
Hospitalist Admission History and Physical  Patient name: Theodore [name] Medical record number: [id number] Date of birth: 1940/06/24 Age: 73 y.o. Gender: male  Primary Care Provider: Tula Nakayama, MD  Chief Complaint: SBO, encephalopathy, AKI, weakness, intermittent atrial fibrillation w/ RVR, mildly elevated trop    History of Present Illness:This is a 73 y.o. year old frail, ill appearing  male with significant past medical history multiple medical problems including chronic resp failure on 2L [place], COPD, CAD, atrial fibrillation on coumadin,  presenting with SBO, encephalopathy AKI, weakness, intermittent atrial fibrillation w/ RVR, mildly elevated troponin. Level V caveat as patient is acutely fatigue/encephalopathic. History primarily from medical record. Patient noted to have been recently admitted December 1 through December 17 for perforated appendiceal abscess. Patient had a drain placed by interventional radiology which was removed on December 13. Was placed on Augmentin/Zosyn empirically. Also had formal cardiology clearance. Was actually seen by his PCP yesterday and was felt to be improving gradually. Per report, patient has not had a bowel movement in 4 days and complained of shortness of breath to nursing staff. Patient currently denies any fevers or chills. No chest pain. Presented to the [facility] ER with temperature 90.8, heart rate in the 30s to 120s, respirations in the tens to 30s, blood pressure in the 110s to 140s over 70s to 100s. Satting 100% on 2 L. White blood cell count 8.8, hemoglobin 9.1, creatinine 2.27, bicarbonate 37, troponin 0.04, INR 4.87. Lactate 1.1. LFTs within normal limits. BNP 720. CT of the abdomen and pelvis obtained showingMultiple prominent dilated loops of proximal and mid small bowel within the upper and mid abdomen concerning for mechanical small bowel obstruction. No free air.   Assessment and Plan: Theodore [name] is a 72 y.o. year old male  presenting with SBO, encephalopathy, AKI, weakness, intermittent atrial fibrillation w/ RVR, mildly elevated trop  Active Problems:   SBO (small bowel obstruction)   AKI (acute kidney injury)   Encephalopathy   1-Encephalopathy/Weakness -multifactorial in setting of SBO, AKI, dehydration, subacute COPD -seems to be fairly deconditioned chronically-but not aware of true baseline -check head CT, UDS, ammonia  -ABG -pan culture -UA  -CXR w/ no acute infiltrate - gently hydrate hydrate pt   2- SBO  -Dr. Arnoldo Morale consulted at [facility]  -Will need formal consult by [facility] at [facility] -lactate WNL currently.  -NG tube  -NPO  -check hemoccult   3- AKI -likely prerenal in etiology  -clinically dry on exam  -gently hydrate pt  -FeNa   4- Chronic resp failure on 2L/COPD -Acute on chronic -active wheezing and increased WOB on exam  -IV solumedrol -xopenex   -cont supplemental O2-oxygenating well currently -ABG -IV levaquin-should give some GI coverage  -QTc 473 msec-follow w/ flouroquinolone use   5- Atrial fibrillation  -intermittent RVR on exam  -start dilt gtt -INR supratherapeutic -coumadin per pharmacy  -cards consult at [facility]  6- Chronic diastolic heart failure, class 4 NYHA -BNP 720 -clinically dry on exam  -gently hydrate -2D ECHO -weight 12/22-154.6  -weight 12/28-154.6  -strict Is and Os, daily weights   7- Mildly elevated trop -trop 0.04 on admission in setting of AKI -no active chest pain though w/ intermittent Afib w/ RVR in setting of above -multiple comorbidities in baseline fairly deconditioned pt  -cycle CEs  -tele monitoring -baby ASA  -f/u cards recs   8- Anemia  -likely anemia of chronic illness -hgb fairly stable from recent admission -no overt signs of bleeding currently  in setting of coumadin use -check hemoccult  -follow   FEN/GI: NPO. noted metabolic alkalosis- ?2/2 to SBO vs. Chronic in setting of COPD-follow    Prophylaxis: coumadin    Disposition: pt overall fairly deconditioned and frail appearing on presentation.  I do not feel that pt needs PCCM involvement currently-however this may change.  As a precautionary measure and given above, I feel pt would benefit from formal transfer to [facility] as there are fairly limited resources overnight here at [facility] if pt has any adverse event overnight. Discussed case w/ Dr. Posey Pronto as well as on call floor coverage at [facility].   Code Status:Full Code    Patient Active Problem List   Diagnosis Date Noted  . SBO (small bowel obstruction) 03/09/2014  . Constipation 03/08/2014  . Hospital discharge follow-up 03/08/2014  . Acute diastolic congestive heart failure   . Hypokalemia 02/24/2014  . Enteritis 02/10/2014  . Appendicitis with abscess   . Need for vaccination with 13-polyvalent pneumococcal conjugate vaccine 01/03/2014  . Lung nodule 11/21/2013  . Abnormal CAT scan 10/10/2013  . Epistaxis, recurrent 07/08/2013  . ARF (acute renal failure) 07/08/2013  . Encounter for therapeutic drug monitoring 07/03/2013  . NSVT (nonsustained ventricular tachycardia) 07/01/2013  . COPD with acute exacerbation 06/24/2013  . Leukocytosis 06/22/2013  . Normocytic anemia 06/18/2013  . Acute on chronic diastolic heart failure 40/98/1191  . Pulmonary arterial hypertension 06/17/2013  . Acute respiratory failure with hypoxia 06/17/2013  . Chronic diastolic CHF (congestive heart failure) 06/16/2013  . Routine general medical examination at a health care facility 05/25/2013  . Hyperuricemia 05/23/2013  . Depression with anxiety 05/21/2013  . Insomnia secondary to anxiety 04/14/2013  . Glaucoma 04/14/2013  . COPD (chronic obstructive pulmonary disease) 04/02/2013  . Epistaxis 04/01/2013  . Acute blood loss anemia 04/01/2013  . 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 Tobacco abuse disorder 12/24/2010  . Encounter for long-term (current) use of other  medications 05/15/2010  . Essential hypertension 02/13/2006  . Atrial fibrillation 02/13/2006   Past Medical History: Past Medical History  Diagnosis Date  . Glaucoma     uses eye drops daily  . Allergic rhinitis     takes Singulair at bedtime  . Atrial fibrillation     takes Coumadin daily  . Diastolic heart failure     LVEF 47-82%, rate 2 diastolic dysfunction 11/5619  . Pulmonary hypertension     70 mmHg 03/2012  . Asthma     Albuterol neb and inhaler as needed  . Anxiety     takes Xanax daily as needed  . COPD (chronic obstructive pulmonary disease)     Symbicort daily  . Essential hypertension, benign     takes Metoprolol and Diltiazem daily  . Anemia     takes ferrous sulfate daily  . History of bronchitis   . Shortness of breath     with exertion  . Numbness     fingertips on both hands  . Lung nodules   . History of blood transfusion     Past Surgical History: Past Surgical History  Procedure Laterality Date  . Bilateral cataract surgery    . Herniorrhapy    . Tendon repair      Right hand surgical procedure for a tendon repair  . Colonoscopy N/A 11/29/2012    Procedure: COLONOSCOPY;  Surgeon: Danie Binder, MD;  Location: [facility];  Service: Endoscopy;  Laterality: N/A;  1:00  .  Esophagogastroduodenoscopy N/A 11/29/2012    Procedure: ESOPHAGOGASTRODUODENOSCOPY (EGD);  Surgeon: Danie Binder, MD;  Location: [facility];  Service: Endoscopy;  Laterality: N/A;  . Eye surgery    . Hernia repair    . Radiology with anesthesia N/A 07/10/2013    Procedure: EMBOLIZATION-RADIOLOGY WITH ANESTHESIA;  Surgeon: Rob Hickman, MD;  Location: [place];  Service: Radiology;  Laterality: N/A;  . Nose surgery      d/t nosebleeds  . Video bronchoscopy with endobronchial ultrasound N/A 11/21/2013    Procedure: VIDEO BRONCHOSCOPY WITH ENDOBRONCHIAL ULTRASOUND;  Surgeon: Melrose Nakayama, MD;  Location: [place];  Service: Thoracic;  Laterality: N/A;  .  Mediastinoscopy N/A 11/21/2013    Procedure: MEDIASTINOSCOPY;  Surgeon: Melrose Nakayama, MD;  Location: [place];  Service: Thoracic;  Laterality: N/A;    Social History: History   Social History  . Marital Status: Widowed    Spouse Name: N/A    Number of Children: 2  . Years of Education: N/A   Occupational History  . Retired   .     Social History Main Topics  . Smoking status: Current Some Day Smoker -- 0.50 packs/day for 60 years    Types: Cigarettes  . Smokeless tobacco: Never Used  . Alcohol Use: No  . Drug Use: No  . Sexual Activity: No   Other Topics Concern  . None   Social History Narrative    Family History: Family History  Problem Relation Age of Onset  . Cancer Mother   . Cancer Father   . Coronary artery disease Brother   . Colon cancer Brother     Allergies: Allergies  Allergen Reactions  . Aspirin Other (See Comments)    On coumadin    Current Facility-Administered Medications  Medication Dose Route Frequency Provider Last Rate Last Dose  . [START ON 03/10/2014] 0.9 %  sodium chloride infusion   Intravenous Continuous Fredia Sorrow, MD 10 mL/hr at 03/09/14 1718    . 0.9 %  sodium chloride infusion   Intravenous Continuous Shanda Howells, MD      . diltiazem (CARDIZEM) 100 mg in dextrose 5 % 100 mL (1 mg/mL) infusion  5-15 mg/hr Intravenous Titrated Shanda Howells, MD      . methylPREDNISolone sodium succinate (SOLU-MEDROL) 125 mg/2 mL injection 125 mg  125 mg Intravenous Q12H Shanda Howells, MD      . sodium chloride 0.9 % injection 3 mL  3 mL Intravenous Q12H Shanda Howells, MD       Current Outpatient Prescriptions  Medication Sig Dispense Refill  . albuterol (PROAIR HFA) 108 (90 BASE) MCG/ACT inhaler Inhale 2 puffs into the lungs every 6 (six) hours as needed for wheezing or shortness of breath. 6.7 g 4  . albuterol (PROVENTIL) (2.5 MG/3ML) 0.083% nebulizer solution Take 2.5 mg by nebulization every 8 (eight) hours as needed for wheezing or  shortness of breath.    . allopurinol (ZYLOPRIM) 300 MG tablet GIVE "Taevyn" 1 TABLET BY MOUTH EVERY DAILY 30 tablet 2  . ALPRAZolam (XANAX) 0.25 MG tablet Take 0.25 mg by mouth daily as needed for anxiety.    . brimonidine-timolol (COMBIGAN) 0.2-0.5 % ophthalmic solution Place 1 drop into both eyes every 12 (twelve) hours.    . budesonide-formoterol (SYMBICORT) 160-4.5 MCG/ACT inhaler Inhale 2 puffs into the lungs 2 (two) times daily. 1 Inhaler 4  . diltiazem (CARDIZEM CD) 180 MG 24 hr capsule Take 1 capsule (180 mg total) by mouth daily. 30 capsule  6  . ferrous sulfate 325 (65 FE) MG tablet Take 1 tablet (325 mg total) by mouth 2 (two) times daily with a meal.  3  . mirtazapine (REMERON) 15 MG tablet Take 1 tablet (15 mg total) by mouth at bedtime. 30 tablet 4  . montelukast (SINGULAIR) 10 MG tablet Take 10 mg by mouth at bedtime.    . potassium chloride (K-DUR) 10 MEQ tablet GIVE "Deyonte" 1 TABLET BY MOUTH TWICE DAILY 60 tablet 2  . senna (SENOKOT) 8.6 MG TABS tablet Take 1 tablet (8.6 mg total) by mouth 2 (two) times daily. 120 each 0  . tiotropium (SPIRIVA) 18 MCG inhalation capsule Place 1 capsule (18 mcg total) into inhaler and inhale daily. 30 capsule 2  . torsemide (DEMADEX) 20 MG tablet Take 2 tablets (40 mg total) by mouth daily. 60 tablet 3  . traMADol (ULTRAM) 50 MG tablet Take 50 mg by mouth daily as needed (pain).    Marland Kitchen travoprost, benzalkonium, (TRAVATAN) 0.004 % ophthalmic solution Place 1 drop into both eyes at bedtime.    Derrill Memo ON 03/13/2014] traZODone (DESYREL) 50 MG tablet Take 0.5-1 tablets (25-50 mg total) by mouth at bedtime as needed for sleep. 30 tablet 5  . warfarin (COUMADIN) 4 MG tablet Take 1.5 tabs (6 mg) on Monday and Thursday and 1 tab (4mg ) on other days 40 tablet 3  . enoxaparin (LOVENOX) 60 MG/0.6ML injection Inject 0.6 mLs (60 mg total) into the skin every 12 (twelve) hours. (Patient not taking: Reported on 03/09/2014) 14 Syringe 0  . traMADol (ULTRAM) 50 MG  tablet TAKE 1 TABLET BY MOUTH EVERY DAY AS NEEDED (Patient not taking: Reported on 03/09/2014) 30 tablet 3   Review Of Systems: 12 point ROS negative except as noted above in HPI.  Physical Exam: Filed Vitals:   03/09/14 2100  BP: 132/84  Pulse: 102  Temp:   Resp: 20    General: cachectic, mild distress and mild to moderate increased WOB HEENT: PERRLA and dry oral mucosa Heart: S1, S2 normal, no murmur, rub or gallop, regular rate and rhythm Lungs: expiratory wheezes and rhonchi throughout both lung fields and mild increased WOB, unable to speak in full sentences  Abdomen:marked distended, tympanic abdomen, non tender  Extremities: extremities normal, atraumatic, no cyanosis or edema Skin:no rashes Neurology: minimally cooperative to exam   Labs and Imaging: Lab Results  Component Value Date/Time   NA 140 03/09/2014 03:33 PM   K 4.2 03/09/2014 03:33 PM   CL 91* 03/09/2014 03:33 PM   CO2 37* 03/09/2014 03:33 PM   BUN 35* 03/09/2014 03:33 PM   CREATININE 2.27* 03/09/2014 03:33 PM   CREATININE 1.07 09/29/2013 11:32 AM   GLUCOSE 114* 03/09/2014 03:33 PM   Lab Results  Component Value Date   WBC 8.8 03/09/2014   HGB 9.1* 03/09/2014   HCT 30.1* 03/09/2014   MCV 92.0 03/09/2014   PLT 343 03/09/2014       Ct Abdomen Pelvis Wo Contrast  03/09/2014   CLINICAL DATA:  Initial evaluation for abdominal distension and nausea. No bowel movement and 4 days. Recent perforated appendix.  EXAM: CT ABDOMEN AND PELVIS WITHOUT CONTRAST  TECHNIQUE: Multidetector CT imaging of the abdomen and pelvis was performed following the standard protocol without IV contrast.  COMPARISON:  Prior radiograph from earlier the same day as well as prior CT from 02/20/2014.  FINDINGS: Small layering bilateral pleural effusions with associated bibasilar opacity present, which may reflect atelectasis or consolidation. There is prominent  cardiomegaly. Scattered coronary artery and mitral valvular calcifications  present. No pericardial effusion.  Limited noncontrast evaluation of the liver is within normal limits. Gallbladder are mildly distended without associated inflammatory changes. No biliary dilatation. Spleen, adrenal glands, and pancreas within normal limits.  Kidneys within normal limits without nephrolithiasis or hydronephrosis.  Stomach is markedly distended with air contrast level present 6 cm in diameter with scattered internal air-fluid levels. There is an apparent transition point within knee lower right abdomen (series 2, image 57). Findings compatible with acute small bowel obstruction. The small bowel is fluid filled distally but of normal caliber. The colon is decompressed. Moderate amount of retained stool within the ascending and proximal transverse colon. No free intraperitoneal air. Small volume ascites present adjacent to the liver.  Previously seen percutaneous pigtail drain has been removed from the right lower quadrant.  Bladder decompressed with a Foley catheter in place. Prostate normal.  Right inguinal lymph nodes measuring up to 1.3 cm in short axis noted, similar to prior, and may be reactive in nature. No other pathologically enlarged intra-abdominal pelvic lymph nodes identified. Present Moderate atheromatous plaque present within the intra-abdominal aorta and its branch vessels. No aneurysm.  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 No acute osseus abnormality. No worrisome lytic or blastic osseous lesions.  IMPRESSION: 1. Multiple prominent dilated loops of proximal and mid small bowel within the upper and mid abdomen, markedly increased in caliber relative to recent CT from 02/24/2014. There is a suspected transition point in the right lower quadrant, concerning for mechanical small bowel obstruction. No free intraperitoneal air. 2. Small volume free perihepatic fluid. 3. Small bilateral pleural effusions with associated bibasilar  atelectasis/consolidation. 4. Right inguinal adenopathy measuring up to 1.3 cm in short axis, similar to prior. These nodes are suspected to be reactive in nature. 5. Moderate cardiomegaly.   Electronically Signed   By: Jeannine Boga M.D.   On: 03/09/2014 21:16   Dg Abd Acute W/chest  03/09/2014   CLINICAL DATA:  Abdominal distension and abdominal pain for 4 weeks. History of ruptured bladder.  EXAM: ACUTE ABDOMEN SERIES (ABDOMEN 2 VIEW & CHEST 1 VIEW)  COMPARISON:  02/26/2014.  FINDINGS: Compared to prior, pulmonary aeration has improved although there is still elevation of both hemidiaphragms and low lung volumes with basilar atelectasis. Cardiopericardial silhouette remains enlarged. Monitoring leads project over the chest. No pulmonary edema or focal airspace consolidation identified.  On the upright abdominal images, there is gas that extends across the central diaphragms which may be within colon but is concerning for free air and decubitus radiographs are recommended. This gas-filled structure does not have the expected mural markings for small bowel, large bowel or stomach.  Large amount of stool is present along the ascending colon. The previously seen RIGHT lower quadrant drain has been removed. On the supine radiographs, there appears to be a dilated loop of small bowel extending across the central abdomen, measuring up to 5 cm. On the upright images, this has a fluid level. There is stool and bowel gas extending along the descending colon however because of technical degradation of the study. Rectal gas cannot be evaluated.  IMPRESSION: 1. Findings suspicious for free air in the abdomen. Decubitus radiographs recommended. Critical Value/emergent results were called by telephone at the time of interpretation on 03/09/2014 at 4:49 pm to Dr. Fredia Sorrow , who verbally acknowledged these results. 2. Low volume chest with cardiomegaly. 3. Dilated loop of small bowel measuring up to  5 cm  suspicious for obstruction. 4. Interval removal of RIGHT lower quadrant percutaneous strain.   Electronically Signed   By: Dereck Ligas M.D.   On: 03/09/2014 16:51   Dg Abd Decub  03/09/2014   CLINICAL DATA:  Suspected intraperitoneal free air, followup assess.  EXAM: ABDOMEN - 1 VIEW DECUBITUS  COMPARISON:  Abdominal radiographs March 09, 2014 at 1614 hr  FINDINGS: Limited assessment as RIGHT-sided imaged down. Large gaseous distended structure with air-fluid level in mid abdomen, in addition to a few bubbles of potentially intraperitoneal free air.  IMPRESSION: Suspected pneumoperitoneum on this limited RIGHT lateral decubitus examination. Large central abdomen air-fluid level, it is unclear if this reflects bowel/stomach or possible abscess. Recommend CT of the abdomen and pelvis with contrast.   Electronically Signed   By: Elon Alas   On: 03/09/2014 17:44           Shanda Howells MD  Pager: [phone]

## 2014-03-09 NOTE — ED Provider Notes (Addendum)
CSN: [id number]     Arrival date & time 03/09/14  1326 History   First MD Initiated Contact with Patient 03/09/14 1404     This chart was scribed for Theodore Sorrow, MD by Theodore [name], ED Scribe. This patient was seen in room [facility]10/[facility]10 and the patient's care was started 9:56 PM.   Chief Complaint  Patient presents with  . Abdominal Pain   Patient is a 73 y.o. male presenting with abdominal pain. The history is provided by the patient. No language interpreter was used.  Abdominal Pain Pain location:  LUQ Pain radiates to:  Does not radiate Pain severity:  Moderate Timing:  Constant Progression:  Unchanged Chronicity:  New Relieved by:  None tried Worsened by:  Nothing tried Ineffective treatments:  None tried Associated symptoms: nausea   Associated symptoms: no chest pain, no chills, no cough, no diarrhea, no dysuria, no fever, no sore throat and no vomiting     HPI Comments: Theodore [name] is a 73 y.o. male with a PMHx of COPD, A-Fib, CHF, who presents to the Emergency Department complaining of constant abdominal distention and LUQ abdominal pain onset few days. Currently he rates pain 5/10 and does not radiate. He also reports nausea but denies any vomiting episodes. Theodore [name] states he is urinating as normal but says he has not had a bowel movement in 4 days. Per triage note, pt had a ruptured bladder recently and was sent to [facility] hospital.  Pt curently lives alone at home. He is on Coumadin daily. Pt with known allergy to Aspirin.  Past Medical History  Diagnosis Date  . Glaucoma     uses eye drops daily  . Allergic rhinitis     takes Singulair at bedtime  . Atrial fibrillation     takes Coumadin daily  . Diastolic heart failure     LVEF 27-25%, rate 2 diastolic dysfunction 05/6642  . Pulmonary hypertension     70 mmHg 03/2012  . Asthma     Albuterol neb and inhaler as needed  . Anxiety     takes Xanax daily as needed  . COPD (chronic obstructive pulmonary disease)      Symbicort daily  . Essential hypertension, benign     takes Metoprolol and Diltiazem daily  . Anemia     takes ferrous sulfate daily  . History of bronchitis   . Shortness of breath     with exertion  . Numbness     fingertips on both hands  . Lung nodules   . History of blood transfusion    Past Surgical History  Procedure Laterality Date  . Bilateral cataract surgery    . Herniorrhapy    . Tendon repair      Right hand surgical procedure for a tendon repair  . Colonoscopy N/A 11/29/2012    Procedure: COLONOSCOPY;  Surgeon: Danie Binder, MD;  Location: [facility];  Service: Endoscopy;  Laterality: N/A;  1:00  . Esophagogastroduodenoscopy N/A 11/29/2012    Procedure: ESOPHAGOGASTRODUODENOSCOPY (EGD);  Surgeon: Danie Binder, MD;  Location: [facility];  Service: Endoscopy;  Laterality: N/A;  . Eye surgery    . Hernia repair    . Radiology with anesthesia N/A 07/10/2013    Procedure: EMBOLIZATION-RADIOLOGY WITH ANESTHESIA;  Surgeon: Rob Hickman, MD;  Location: [place];  Service: Radiology;  Laterality: N/A;  . Nose surgery      d/t nosebleeds  . Video bronchoscopy with endobronchial ultrasound N/A 11/21/2013  Procedure: VIDEO BRONCHOSCOPY WITH ENDOBRONCHIAL ULTRASOUND;  Surgeon: Melrose Nakayama, MD;  Location: [place];  Service: Thoracic;  Laterality: N/A;  . Mediastinoscopy N/A 11/21/2013    Procedure: MEDIASTINOSCOPY;  Surgeon: Melrose Nakayama, MD;  Location: [facility] OR;  Service: Thoracic;  Laterality: N/A;   Family History  Problem Relation Age of Onset  . Cancer Mother   . Cancer Father   . Coronary artery disease Brother   . Colon cancer Brother    History  Substance Use Topics  . Smoking status: Current Some Day Smoker -- 0.50 packs/day for 60 years    Types: Cigarettes  . Smokeless tobacco: Never Used  . Alcohol Use: No    Review of Systems  Constitutional: Negative for fever and chills.  HENT: Negative for congestion, rhinorrhea and sore  throat.   Eyes: Negative for visual disturbance.  Respiratory: Negative for cough.   Cardiovascular: Negative for chest pain and leg swelling.  Gastrointestinal: Positive for nausea, abdominal pain and abdominal distention. Negative for vomiting and diarrhea.  Genitourinary: Negative for dysuria.  Musculoskeletal: Negative for back pain.  Skin: Negative for rash.  Neurological: Negative for headaches.  Hematological: Bruises/bleeds easily.  Psychiatric/Behavioral: Negative for confusion.      Allergies  Aspirin  Home Medications   Prior to Admission medications   Medication Sig Start Date End Date Taking? Authorizing Provider  albuterol (PROAIR HFA) 108 (90 BASE) MCG/ACT inhaler Inhale 2 puffs into the lungs every 6 (six) hours as needed for wheezing or shortness of breath. 03/02/14  Yes Fayrene Helper, MD  albuterol (PROVENTIL) (2.5 MG/3ML) 0.083% nebulizer solution Take 2.5 mg by nebulization every 8 (eight) hours as needed for wheezing or shortness of breath.   Yes Historical Provider, MD  allopurinol (ZYLOPRIM) 300 MG tablet GIVE "Theodore [name]" 1 TABLET BY MOUTH EVERY DAILY 03/02/14  Yes Fayrene Helper, MD  ALPRAZolam Duanne Moron) 0.25 MG tablet Take 0.25 mg by mouth daily as needed for anxiety.   Yes Historical Provider, MD  brimonidine-timolol (COMBIGAN) 0.2-0.5 % ophthalmic solution Place 1 drop into both eyes every 12 (twelve) hours.   Yes Historical Provider, MD  budesonide-formoterol (SYMBICORT) 160-4.5 MCG/ACT inhaler Inhale 2 puffs into the lungs 2 (two) times daily. 05/23/13  Yes Fayrene Helper, MD  diltiazem (CARDIZEM CD) 180 MG 24 hr capsule Take 1 capsule (180 mg total) by mouth daily. 09/22/13  Yes Herminio Commons, MD  ferrous sulfate 325 (65 FE) MG tablet Take 1 tablet (325 mg total) by mouth 2 (two) times daily with a meal. 02/26/14  Yes Debbe Odea, MD  mirtazapine (REMERON) 15 MG tablet Take 1 tablet (15 mg total) by mouth at bedtime. 12/30/13  Yes Fayrene Helper, MD  montelukast (SINGULAIR) 10 MG tablet Take 10 mg by mouth at bedtime.   Yes Historical Provider, MD  potassium chloride (K-DUR) 10 MEQ tablet GIVE "Theodore [name]" 1 TABLET BY MOUTH TWICE DAILY 03/03/14  Yes Fayrene Helper, MD  senna (SENOKOT) 8.6 MG TABS tablet Take 1 tablet (8.6 mg total) by mouth 2 (two) times daily. 03/03/14  Yes Fayrene Helper, MD  tiotropium (SPIRIVA) 18 MCG inhalation capsule Place 1 capsule (18 mcg total) into inhaler and inhale daily. 03/02/14  Yes Fayrene Helper, MD  torsemide (DEMADEX) 20 MG tablet Take 2 tablets (40 mg total) by mouth daily. 10/02/13  Yes Herminio Commons, MD  traMADol (ULTRAM) 50 MG tablet Take 50 mg by mouth daily as needed (pain).   Yes  Historical Provider, MD  travoprost, benzalkonium, (TRAVATAN) 0.004 % ophthalmic solution Place 1 drop into both eyes at bedtime.   Yes Historical Provider, MD  traZODone (DESYREL) 50 MG tablet Take 0.5-1 tablets (25-50 mg total) by mouth at bedtime as needed for sleep. 03/13/14  Yes Fayrene Helper, MD  warfarin (COUMADIN) 4 MG tablet Take 1.5 tabs (6 mg) on Monday and Thursday and 1 tab (4mg ) on other days 03/03/14  Yes Herminio Commons, MD  enoxaparin (LOVENOX) 60 MG/0.6ML injection Inject 0.6 mLs (60 mg total) into the skin every 12 (twelve) hours. Patient not taking: Reported on 03/09/2014 02/26/14   Debbe Odea, MD  traMADol (ULTRAM) 50 MG tablet TAKE 1 TABLET BY MOUTH EVERY DAY AS NEEDED Patient not taking: Reported on 03/09/2014 11/19/13   Fayrene Helper, MD   Triage Vitals: BP 132/84 mmHg  Pulse 102  Temp(Src) 98 F (36.7 C) (Oral)  Resp 20  Ht 5\' 6"  (1.676 m)  SpO2 100%   Physical Exam  Constitutional: He is oriented to person, place, and time. He appears well-developed and well-nourished.  HENT:  Head: Normocephalic and atraumatic.  Dry mucous membranes   Eyes: EOM are normal.  Neck: Normal range of motion.  Cardiovascular: Regular rhythm, normal heart sounds and intact  distal pulses.  Tachycardia present.   Pulmonary/Chest: Effort normal. No respiratory distress. He has rales (Bilateral).  Abdominal: Bowel sounds are normal. He exhibits distension. There is no tenderness.  Musculoskeletal: Normal range of motion. He exhibits no edema.  Neurological: He is alert and oriented to person, place, and time.  Skin: Skin is warm and dry.  Psychiatric: He has a normal mood and affect. Judgment normal.  Nursing note and vitals reviewed.   ED Course  Procedures (including critical care time)  DIAGNOSTIC STUDIES: Oxygen Saturation is 97% on 2 liters of oxygen, adequate by my interpretation.    COORDINATION OF CARE: 9:56 PM-Discussed treatment plan with pt at bedside and pt agreed to plan.     Labs Review Labs Reviewed  COMPREHENSIVE METABOLIC PANEL - Abnormal; Notable for the following:    Chloride 91 (*)    CO2 37 (*)    Glucose, Bld 114 (*)    BUN 35 (*)    Creatinine, Ser 2.27 (*)    Albumin 3.3 (*)    GFR calc non Af Amer 27 (*)    GFR calc Af Amer 31 (*)    All other components within normal limits  CBC WITH DIFFERENTIAL - Abnormal; Notable for the following:    RBC 3.27 (*)    Hemoglobin 9.1 (*)    HCT 30.1 (*)    RDW 18.2 (*)    Neutrophils Relative % 81 (*)    Lymphocytes Relative 6 (*)    Lymphs Abs 0.5 (*)    Monocytes Relative 13 (*)    Monocytes Absolute 1.2 (*)    All other components within normal limits  PROTIME-INR - Abnormal; Notable for the following:    Prothrombin Time 45.8 (*)    INR 4.87 (*)    All other components within normal limits  TROPONIN I - Abnormal; Notable for the following:    Troponin I 0.04 (*)    All other components within normal limits  BRAIN NATRIURETIC PEPTIDE - Abnormal; Notable for the following:    B Natriuretic Peptide 720.0 (*)    All other components within normal limits  CULTURE, BLOOD (ROUTINE X 2)  CULTURE, BLOOD (ROUTINE X 2)  LIPASE, BLOOD  LACTIC ACID, PLASMA  URINALYSIS, ROUTINE W  REFLEX MICROSCOPIC   Results for orders placed or performed during the hospital encounter of 03/09/14  Culture, blood (routine x 2)  Result Value Ref Range   Specimen Description BLOOD RIGHT ARM    Special Requests BOTTLES DRAWN AEROBIC AND ANAEROBIC 6CC BOTTLES    Culture PENDING    Report Status PENDING   Culture, blood (routine x 2)  Result Value Ref Range   Specimen Description BLOOD RIGHT ARM    Special Requests BOTTLES DRAWN AEROBIC AND ANAEROBIC 10CC BOTTLES    Culture NO GROWTH <24 HRS    Report Status PENDING   Comprehensive metabolic panel  Result Value Ref Range   Sodium 140 135 - 145 mmol/L   Potassium 4.2 3.5 - 5.1 mmol/L   Chloride 91 (L) 96 - 112 mEq/L   CO2 37 (H) 19 - 32 mmol/L   Glucose, Bld 114 (H) 70 - 99 mg/dL   BUN 35 (H) 6 - 23 mg/dL   Creatinine, Ser 2.27 (H) 0.50 - 1.35 mg/dL   Calcium 9.4 8.4 - 10.5 mg/dL   Total Protein 8.3 6.0 - 8.3 g/dL   Albumin 3.3 (L) 3.5 - 5.2 g/dL   AST 23 0 - 37 U/L   ALT 12 0 - 53 U/L   Alkaline Phosphatase 96 39 - 117 U/L   Total Bilirubin 1.0 0.3 - 1.2 mg/dL   GFR calc non Af Amer 27 (L) >90 mL/min   GFR calc Af Amer 31 (L) >90 mL/min   Anion gap 12 5 - 15  Lipase, blood  Result Value Ref Range   Lipase 15 11 - 59 U/L  CBC with Differential  Result Value Ref Range   WBC 8.8 4.0 - 10.5 K/uL   RBC 3.27 (L) 4.22 - 5.81 MIL/uL   Hemoglobin 9.1 (L) 13.0 - 17.0 g/dL   HCT 30.1 (L) 39.0 - 52.0 %   MCV 92.0 78.0 - 100.0 fL   MCH 27.8 26.0 - 34.0 pg   MCHC 30.2 30.0 - 36.0 g/dL   RDW 18.2 (H) 11.5 - 15.5 %   Platelets 343 150 - 400 K/uL   Neutrophils Relative % 81 (H) 43 - 77 %   Neutro Abs 7.0 1.7 - 7.7 K/uL   Lymphocytes Relative 6 (L) 12 - 46 %   Lymphs Abs 0.5 (L) 0.7 - 4.0 K/uL   Monocytes Relative 13 (H) 3 - 12 %   Monocytes Absolute 1.2 (H) 0.1 - 1.0 K/uL   Eosinophils Relative 0 0 - 5 %   Eosinophils Absolute 0.0 0.0 - 0.7 K/uL   Basophils Relative 0 0 - 1 %   Basophils Absolute 0.0 0.0 - 0.1 K/uL   Protime-INR  Result Value Ref Range   Prothrombin Time 45.8 (H) 11.6 - 15.2 seconds   INR 4.87 (H) 0.00 - 1.49  Troponin I  Result Value Ref Range   Troponin I 0.04 (H) <0.031 ng/mL  Brain natriuretic peptide  Result Value Ref Range   B Natriuretic Peptide 720.0 (H) 0.0 - 100.0 pg/mL  Lactic acid, plasma  Result Value Ref Range   Lactic Acid, Venous 1.1 0.5 - 2.2 mmol/L     Imaging Review Ct Abdomen Pelvis Wo Contrast  03/09/2014   CLINICAL DATA:  Initial evaluation for abdominal distension and nausea. No bowel movement and 4 days. Recent perforated appendix.  EXAM: CT ABDOMEN AND PELVIS WITHOUT CONTRAST  TECHNIQUE: Multidetector CT imaging  of the abdomen and pelvis was performed following the standard protocol without IV contrast.  COMPARISON:  Prior radiograph from earlier the same day as well as prior CT from 02/20/2014.  FINDINGS: Small layering bilateral pleural effusions with associated bibasilar opacity present, which may reflect atelectasis or consolidation. There is prominent cardiomegaly. Scattered coronary artery and mitral valvular calcifications present. No pericardial effusion.  Limited noncontrast evaluation of the liver is within normal limits. Gallbladder are mildly distended without associated inflammatory changes. No biliary dilatation. Spleen, adrenal glands, and pancreas within normal limits.  Kidneys within normal limits without nephrolithiasis or hydronephrosis.  Stomach is markedly distended with air contrast level present 6 cm in diameter with scattered internal air-fluid levels. There is an apparent transition point within knee lower right abdomen (series 2, image 57). Findings compatible with acute small bowel obstruction. The small bowel is fluid filled distally but of normal caliber. The colon is decompressed. Moderate amount of retained stool within the ascending and proximal transverse colon. No free intraperitoneal air. Small volume ascites present adjacent to the  liver.  Previously seen percutaneous pigtail drain has been removed from the right lower quadrant.  Bladder decompressed with a Foley catheter in place. Prostate normal.  Right inguinal lymph nodes measuring up to 1.3 cm in short axis noted, similar to prior, and may be reactive in nature. No other pathologically enlarged intra-abdominal pelvic lymph nodes identified. Present Moderate atheromatous plaque present within the intra-abdominal aorta and its branch vessels. No aneurysm.  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 No acute osseus abnormality. No worrisome lytic or blastic osseous lesions.  IMPRESSION: 1. Multiple prominent dilated loops of proximal and mid small bowel within the upper and mid abdomen, markedly increased in caliber relative to recent CT from 02/24/2014. There is a suspected transition point in the right lower quadrant, concerning for mechanical small bowel obstruction. No free intraperitoneal air. 2. Small volume free perihepatic fluid. 3. Small bilateral pleural effusions with associated bibasilar atelectasis/consolidation. 4. Right inguinal adenopathy measuring up to 1.3 cm in short axis, similar to prior. These nodes are suspected to be reactive in nature. 5. Moderate cardiomegaly.   Electronically Signed   By: Jeannine Boga M.D.   On: 03/09/2014 21:16   Dg Abd Acute W/chest  03/09/2014   CLINICAL DATA:  Abdominal distension and abdominal pain for 4 weeks. History of ruptured bladder.  EXAM: ACUTE ABDOMEN SERIES (ABDOMEN 2 VIEW & CHEST 1 VIEW)  COMPARISON:  02/26/2014.  FINDINGS: Compared to prior, pulmonary aeration has improved although there is still elevation of both hemidiaphragms and low lung volumes with basilar atelectasis. Cardiopericardial silhouette remains enlarged. Monitoring leads project over the chest. No pulmonary edema or focal airspace consolidation identified.  On the upright abdominal images, there is gas that extends  across the central diaphragms which may be within colon but is concerning for free air and decubitus radiographs are recommended. This gas-filled structure does not have the expected mural markings for small bowel, large bowel or stomach.  Large amount of stool is present along the ascending colon. The previously seen RIGHT lower quadrant drain has been removed. On the supine radiographs, there appears to be a dilated loop of small bowel extending across the central abdomen, measuring up to 5 cm. On the upright images, this has a fluid level. There is stool and bowel gas extending along the descending colon however because of technical degradation of the study. Rectal gas cannot be evaluated.  IMPRESSION: 1. Findings  suspicious for free air in the abdomen. Decubitus radiographs recommended. Critical Value/emergent results were called by telephone at the time of interpretation on 03/09/2014 at 4:49 pm to Dr. Fredia [name] , who verbally acknowledged these results. 2. Low volume chest with cardiomegaly. 3. Dilated loop of small bowel measuring up to 5 cm suspicious for obstruction. 4. Interval removal of RIGHT lower quadrant percutaneous strain.   Electronically Signed   By: Dereck Ligas M.D.   On: 03/09/2014 16:51   Dg Abd Decub  03/09/2014   CLINICAL DATA:  Suspected intraperitoneal free air, followup assess.  EXAM: ABDOMEN - 1 VIEW DECUBITUS  COMPARISON:  Abdominal radiographs March 09, 2014 at 1614 hr  FINDINGS: Limited assessment as RIGHT-sided imaged down. Large gaseous distended structure with air-fluid level in mid abdomen, in addition to a few bubbles of potentially intraperitoneal free air.  IMPRESSION: Suspected pneumoperitoneum on this limited RIGHT lateral decubitus examination. Large central abdomen air-fluid level, it is unclear if this reflects bowel/stomach or possible abscess. Recommend CT of the abdomen and pelvis with contrast.   Electronically Signed   By: Elon Alas   On:  03/09/2014 17:44     EKG Interpretation   Date/Time:  Monday March 09 2014 14:10:01 EST Ventricular Rate:  116 PR Interval:    QRS Duration: 93 QT Interval:  339 QTC Calculation: 471 R Axis:   -65 Text Interpretation:  Atrial fibrillation Inferior infarct, old Consider  anterior infarct Lateral leads are also involved No significant change  since last tracing Reconfirmed by Ludie Pavlik  MD, Kyzer Blowe [id number]) on  03/09/2014 2:22:49 PM      MDM   Final diagnoses:  Abdominal distention  Renal failure  SBO (small bowel obstruction)  Atrial fibrillation, unspecified    CT results consistent with small bowel obstruction. Discussed with Dr. Arnoldo Morale from general surgery is comfortable with the patient staying here and he will consult. Patient also has renal failure that seems to be prerenal. He is also has a sniffing the elevated INR. Patient is in atrial fib but not rapid rate.   Patient status post CT directed drainage of the appendiceal rupture or abscess beginning of the month no further evidence of that. Patient without any vomiting here despite the evidence of bowel obstruction. Patient not hypotensive. Patient not acidotic. No evidence of free air. Patient's INR as mentioned was 1.1. Also mild elevation in the troponin will need serial troponins for further evaluation.  As mentioned marked elevation BUN and creatinine. This is consistent with renal failure no elevation in the potassium. It is possible this could be all prerenal.  Review of the chart shows he was never on the surgical service interventional radiology placed a CT directed drainage into the appendiceal abscess. Surgery was consult and on at [facility].  .Dr. Arnoldo Morale her local general surgeon is fine with consult tingling on [name] here. He does not feel that he needs to go back to [facility].    We'll discuss with the hospitalist.  I personally performed the services described in this documentation, which was scribed in my  presence. The recorded information has been reviewed and is accurate.    Theodore Sorrow, MD 03/09/14 [id number]  Theodore Sorrow, MD 03/09/14 [id number]  Theodore Sorrow, MD 03/09/14 2214

## 2014-03-09 NOTE — ED Notes (Signed)
Pt states he had a ruptured bladder and was sent to [place]. States he has not had a bowel movement in four days and he cannot breath

## 2014-03-09 NOTE — H&P (Signed)
PULMONARY / CRITICAL CARE MEDICINE   Name: Theodore [name] MRN: [id number] DOB: 09-27-1940    ADMISSION DATE:  03/09/2014 CONSULTATION DATE:  03/10/2014  REFERRING MD :  EDP  CHIEF COMPLAINT:  AMS  INITIAL PRESENTATION:  73 y.o. M brought to [facility] ED 12/28 for SOB and AMS.  Had recent admission 12/1 - 12/17 for appendiceal abscess.  In ED, found to have SBO, AKI, acute encephalopathy.  Pt transferred to [facility] for further care, PCCM consulted..     STUDIES:  Abd 12/28 >>> findings suspicious for free air, dilated loop of SB suspicious for SBO. CT abd / pelv 12/28 >>> concerning for SBO, no free air, small volume perihepatic fluid, small bilateral pleural effusions, right inguinal adenopathy suspected to be reactive, mild cardiomegaly. CT head 12/29 >>> no acute process Echo 12/29 >>>  SIGNIFICANT EVENTS: 12/28 - admitted with SBO appy drain  12/1- 12/ 13   HISTORY OF PRESENT ILLNESS:   Theodore [name] is a 73 y.o. M with PMH as outlined below.  He had recent admission to [facility] 12/1 - 12/7 for appendiceal abscess and had a drain placed by IR (removed 12/13).  He was discharged home with home health RN and PT.  He was seen by PCP just one day prior to presentation and was felt to be gradually improving.  On 12/28, he complained of SOB and was sent to ED.  In ED, CT abd / pelv revealed findings concerning for SBO.  In addition, he had acute encephalopathy, AKI, weakness, intermittent AF with RVR, and mildly elevated troponin. He was transferred to [facility] for further management.   PAST MEDICAL HISTORY :   has a past medical history of Glaucoma; Allergic rhinitis; Atrial fibrillation; Diastolic heart failure; Pulmonary hypertension; Asthma; Anxiety; COPD (chronic obstructive pulmonary disease); Essential hypertension, benign; Anemia; History of bronchitis; Shortness of breath; Numbness; Lung nodules; and History of blood transfusion.  has past surgical history that includes Bilateral cataract  surgery; herniorrhapy; Tendon repair; Colonoscopy (N/A, 11/29/2012); Esophagogastroduodenoscopy (N/A, 11/29/2012); Eye surgery; Hernia repair; Radiology with anesthesia (N/A, 07/10/2013); Nose surgery; Video bronchoscopy with endobronchial ultrasound (N/A, 11/21/2013); and Mediastinoscopy (N/A, 11/21/2013). Prior to Admission medications   Medication Sig Start Date End Date Taking? Authorizing Provider  albuterol (PROAIR HFA) 108 (90 BASE) MCG/ACT inhaler Inhale 2 puffs into the lungs every 6 (six) hours as needed for wheezing or shortness of breath. 03/02/14  Yes Fayrene Helper, MD  albuterol (PROVENTIL) (2.5 MG/3ML) 0.083% nebulizer solution Take 2.5 mg by nebulization every 8 (eight) hours as needed for wheezing or shortness of breath.   Yes Historical Provider, MD  allopurinol (ZYLOPRIM) 300 MG tablet GIVE "Rector" 1 TABLET BY MOUTH EVERY DAILY 03/02/14  Yes Fayrene Helper, MD  ALPRAZolam Duanne Moron) 0.25 MG tablet Take 0.25 mg by mouth daily as needed for anxiety.   Yes Historical Provider, MD  brimonidine-timolol (COMBIGAN) 0.2-0.5 % ophthalmic solution Place 1 drop into both eyes every 12 (twelve) hours.   Yes Historical Provider, MD  budesonide-formoterol (SYMBICORT) 160-4.5 MCG/ACT inhaler Inhale 2 puffs into the lungs 2 (two) times daily. 05/23/13  Yes Fayrene Helper, MD  diltiazem (CARDIZEM CD) 180 MG 24 hr capsule Take 1 capsule (180 mg total) by mouth daily. 09/22/13  Yes Herminio Commons, MD  ferrous sulfate 325 (65 FE) MG tablet Take 1 tablet (325 mg total) by mouth 2 (two) times daily with a meal. 02/26/14  Yes Debbe Odea, MD  mirtazapine (REMERON) 15 MG tablet Take  1 tablet (15 mg total) by mouth at bedtime. 12/30/13  Yes Fayrene Helper, MD  montelukast (SINGULAIR) 10 MG tablet Take 10 mg by mouth at bedtime.   Yes Historical Provider, MD  potassium chloride (K-DUR) 10 MEQ tablet GIVE "[place]" 1 TABLET BY MOUTH TWICE DAILY 03/03/14  Yes Fayrene Helper, MD  senna  (SENOKOT) 8.6 MG TABS tablet Take 1 tablet (8.6 mg total) by mouth 2 (two) times daily. 03/03/14  Yes Fayrene Helper, MD  tiotropium (SPIRIVA) 18 MCG inhalation capsule Place 1 capsule (18 mcg total) into inhaler and inhale daily. 03/02/14  Yes Fayrene Helper, MD  torsemide (DEMADEX) 20 MG tablet Take 2 tablets (40 mg total) by mouth daily. 10/02/13  Yes Herminio Commons, MD  traMADol (ULTRAM) 50 MG tablet Take 50 mg by mouth daily as needed (pain).   Yes Historical Provider, MD  travoprost, benzalkonium, (TRAVATAN) 0.004 % ophthalmic solution Place 1 drop into both eyes at bedtime.   Yes Historical Provider, MD  traZODone (DESYREL) 50 MG tablet Take 0.5-1 tablets (25-50 mg total) by mouth at bedtime as needed for sleep. 03/13/14  Yes Fayrene Helper, MD  warfarin (COUMADIN) 4 MG tablet Take 1.5 tabs (6 mg) on Monday and Thursday and 1 tab (4mg ) on other days 03/03/14  Yes Herminio Commons, MD  enoxaparin (LOVENOX) 60 MG/0.6ML injection Inject 0.6 mLs (60 mg total) into the skin every 12 (twelve) hours. Patient not taking: Reported on 03/09/2014 02/26/14   Debbe Odea, MD  traMADol (ULTRAM) 50 MG tablet TAKE 1 TABLET BY MOUTH EVERY DAY AS NEEDED Patient not taking: Reported on 03/09/2014 11/19/13   Fayrene Helper, MD   Allergies  Allergen Reactions  . Aspirin Other (See Comments)    On coumadin    FAMILY HISTORY:  Family History  Problem Relation Age of Onset  . Cancer Mother   . Cancer Father   . Coronary artery disease Brother   . Colon cancer Brother     SOCIAL HISTORY:  reports that he has been smoking Cigarettes.  He has a 30 pack-year smoking history. He has never used smokeless tobacco. He reports that he does not drink alcohol or use illicit drugs.  REVIEW OF SYSTEMS:   All negative; except for those that are bolded, which indicate positives.  Constitutional: weight loss, weight gain, night sweats, fevers, chills, fatigue, weakness.  HEENT: headaches, sore  throat, sneezing, nasal congestion, post nasal drip, difficulty swallowing, tooth/dental problems, visual complaints, visual changes, ear aches. Neuro: difficulty with speech, weakness, numbness, ataxia. CV:  chest pain, orthopnea, PND, swelling in lower extremities, dizziness, palpitations, syncope.  Resp: cough, hemoptysis, dyspnea, wheezing. GI  heartburn, indigestion, abdominal pain, nausea, vomiting, diarrhea, constipation, change in bowel habits, loss of appetite, hematemesis, melena, hematochezia.  GU: dysuria, change in color of urine, urgency or frequency, flank pain, hematuria. MSK: joint pain or swelling, decreased range of motion. Psych: change in mood or affect, depression, anxiety, suicidal ideations, homicidal ideations. Skin: rash, itching, bruising.   SUBJECTIVE:   VITAL SIGNS: Temp:  [98 F (36.7 C)] 98 F (36.7 C) (12/28 1359) Pulse Rate:  [30-114] 67 (12/29 0300) Resp:  [18-31] 20 (12/29 0300) BP: (114-148)/(62-101) 115/62 mmHg (12/29 0300) SpO2:  [73 %-100 %] 98 % (12/29 0300) FiO2 (%):  [35 %-40 %] 40 % (12/29 0511) HEMODYNAMICS:   VENTILATOR SETTINGS: Vent Mode:  [-] BIPAP FiO2 (%):  [35 %-40 %] 40 % Set Rate:  [15 bmp] 15 bmp  INTAKE / OUTPUT: Intake/Output    None     PHYSICAL EXAMINATION: General: Elderly male, in NAD. Neuro: A&O x 3, non-focal.  HEENT: [place]/AT. PERRL, sclerae anicteric. Cardiovascular: IRIR, no M/R/G.  Lungs: Respirations even and unlabored.  Coarse BS with bilateral wheezes.  On BiPAP. Abdomen: BS absent, abd distended and hyperresonant. Musculoskeletal: No gross deformities, no edema.  Skin: Intact, warm, dry, no rashes.  LABS:  CBC  Recent Labs Lab 03/09/14 1533  WBC 8.8  HGB 9.1*  HCT 30.1*  PLT 343   Coag's  Recent Labs Lab 03/09/14 03/09/14 1533  INR 7.0 4.87*   BMET  Recent Labs Lab 03/09/14 1533  NA 140  K 4.2  CL 91*  CO2 37*  BUN 35*  CREATININE 2.27*  GLUCOSE 114*   Electrolytes  Recent  Labs Lab 03/09/14 1533  CALCIUM 9.4   Sepsis Markers  Recent Labs Lab 03/09/14 1744  LATICACIDVEN 1.1   ABG  Recent Labs Lab 03/09/14 2250 03/10/14 0123  PHART 7.230* 7.302*  PCO2ART 88.6* 78.1*  PO2ART 116.0* 75.9*   Liver Enzymes  Recent Labs Lab 03/09/14 1533  AST 23  ALT 12  ALKPHOS 96  BILITOT 1.0  ALBUMIN 3.3*   Cardiac Enzymes  Recent Labs Lab 03/09/14 1533 03/09/14 2239  TROPONINI 0.04* 0.04*   Glucose No results for input(s): GLUCAP in the last 168 hours.  Imaging Ct Abdomen Pelvis Wo Contrast  03/09/2014   CLINICAL DATA:  Initial evaluation for abdominal distension and nausea. No bowel movement and 4 days. Recent perforated appendix.  EXAM: CT ABDOMEN AND PELVIS WITHOUT CONTRAST  TECHNIQUE: Multidetector CT imaging of the abdomen and pelvis was performed following the standard protocol without IV contrast.  COMPARISON:  Prior radiograph from earlier the same day as well as prior CT from 02/20/2014.  FINDINGS: Small layering bilateral pleural effusions with associated bibasilar opacity present, which may reflect atelectasis or consolidation. There is prominent cardiomegaly. Scattered coronary artery and mitral valvular calcifications present. No pericardial effusion.  Limited noncontrast evaluation of the liver is within normal limits. Gallbladder are mildly distended without associated inflammatory changes. No biliary dilatation. Spleen, adrenal glands, and pancreas within normal limits.  Kidneys within normal limits without nephrolithiasis or hydronephrosis.  Stomach is markedly distended with air contrast level present 6 cm in diameter with scattered internal air-fluid levels. There is an apparent transition point within knee lower right abdomen (series 2, image 57). Findings compatible with acute small bowel obstruction. The small bowel is fluid filled distally but of normal caliber. The colon is decompressed. Moderate amount of retained stool within the  ascending and proximal transverse colon. No free intraperitoneal air. Small volume ascites present adjacent to the liver.  Previously seen percutaneous pigtail drain has been removed from the right lower quadrant.  Bladder decompressed with a Foley catheter in place. Prostate normal.  Right inguinal lymph nodes measuring up to 1.3 cm in short axis noted, similar to prior, and may be reactive in nature. No other pathologically enlarged intra-abdominal pelvic lymph nodes identified. Present Moderate atheromatous plaque present within the intra-abdominal aorta and its branch vessels. No aneurysm.  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 No acute osseus abnormality. No worrisome lytic or blastic osseous lesions.  IMPRESSION: 1. Multiple prominent dilated loops of proximal and mid small bowel within the upper and mid abdomen, markedly increased in caliber relative to recent CT from 02/24/2014. There is a suspected transition point in the right lower quadrant, concerning  for mechanical small bowel obstruction. No free intraperitoneal air. 2. Small volume free perihepatic fluid. 3. Small bilateral pleural effusions with associated bibasilar atelectasis/consolidation. 4. Right inguinal adenopathy measuring up to 1.3 cm in short axis, similar to prior. These nodes are suspected to be reactive in nature. 5. Moderate cardiomegaly.   Electronically Signed   By: Jeannine Boga M.D.   On: 03/09/2014 21:16   Ct Head Wo Contrast  03/10/2014   CLINICAL DATA:  Encephalopathy  EXAM: CT HEAD WITHOUT CONTRAST  TECHNIQUE: Contiguous axial images were obtained from the base of the skull through the vertex without intravenous contrast.  COMPARISON:  10/03/2013  FINDINGS: Motion degraded images.  No evidence of parenchymal hemorrhage or extra-axial fluid collection. No mass lesion, mass effect, or midline shift.  No CT evidence of acute infarction.  Subcortical white matter and periventricular  small vessel ischemic changes. Intracranial atherosclerosis.  Age related atrophy.  No ventriculomegaly.  The visualized paranasal sinuses are essentially clear. The mastoid air cells are unopacified.  No evidence of calvarial fracture.  IMPRESSION: Motion degraded images.  No evidence of acute intracranial abnormality.  Atrophy with small vessel ischemic changes and intracranial atherosclerosis.   Electronically Signed   By: Julian Hy M.D.   On: 03/10/2014 00:10   Dg Abd Acute W/chest  03/09/2014   CLINICAL DATA:  Abdominal distension and abdominal pain for 4 weeks. History of ruptured bladder.  EXAM: ACUTE ABDOMEN SERIES (ABDOMEN 2 VIEW & CHEST 1 VIEW)  COMPARISON:  02/26/2014.  FINDINGS: Compared to prior, pulmonary aeration has improved although there is still elevation of both hemidiaphragms and low lung volumes with basilar atelectasis. Cardiopericardial silhouette remains enlarged. Monitoring leads project over the chest. No pulmonary edema or focal airspace consolidation identified.  On the upright abdominal images, there is gas that extends across the central diaphragms which may be within colon but is concerning for free air and decubitus radiographs are recommended. This gas-filled structure does not have the expected mural markings for small bowel, large bowel or stomach.  Large amount of stool is present along the ascending colon. The previously seen RIGHT lower quadrant drain has been removed. On the supine radiographs, there appears to be a dilated loop of small bowel extending across the central abdomen, measuring up to 5 cm. On the upright images, this has a fluid level. There is stool and bowel gas extending along the descending colon however because of technical degradation of the study. Rectal gas cannot be evaluated.  IMPRESSION: 1. Findings suspicious for free air in the abdomen. Decubitus radiographs recommended. Critical Value/emergent results were called by telephone at the time  of interpretation on 03/09/2014 at 4:49 pm to Dr. Fredia Sorrow , who verbally acknowledged these results. 2. Low volume chest with cardiomegaly. 3. Dilated loop of small bowel measuring up to 5 cm suspicious for obstruction. 4. Interval removal of RIGHT lower quadrant percutaneous strain.   Electronically Signed   By: Dereck Ligas M.D.   On: 03/09/2014 16:51   Dg Abd Decub  03/09/2014   CLINICAL DATA:  Suspected intraperitoneal free air, followup assess.  EXAM: ABDOMEN - 1 VIEW DECUBITUS  COMPARISON:  Abdominal radiographs March 09, 2014 at 1614 hr  FINDINGS: Limited assessment as RIGHT-sided imaged down. Large gaseous distended structure with air-fluid level in mid abdomen, in addition to a few bubbles of potentially intraperitoneal free air.  IMPRESSION: Suspected pneumoperitoneum on this limited RIGHT lateral decubitus examination. Large central abdomen air-fluid level, it is unclear if this  reflects bowel/stomach or possible abscess. Recommend CT of the abdomen and pelvis with contrast.   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 Dc BiPAP - try to avoid with distended belly ABG now. Duonebs / Xopenex. Continue solumedrol 40 q 12h. Hold outpatient symbicort, singulair, spiriva.   CARDIOVASCULAR A:  A.Fib with intermittent RVR - on coumadin Acute on chronic diastolic heart failure - echo from 04/15: EF 50-55%, mod LVH. Troponin Leak P:  Continue diltiazem gtt. dc coumadin , will use lovenox per pharmacy once INR <2. Strict I / O's. F/u on echo. Trend troponin. Hold outpatient torsemide (pt appears dry). Day team to please consult cards in AM.  RENAL A:   AKI P:   NS @ 75. BMP.  GASTROINTESTINAL NGT 12/29 >>> A:    SBO Nutrition P:   Place NGT. Day team to please consult CCS. NPO.  HEMATOLOGIC A:   Anemia of chronic disease + iron deficiency Coagulopathy - INR supratherapeutic VTE Prophylaxis P:  Transfuse per usual ICU guidelines. SCD's. CBC.  INFECTIOUS A:   AECOPD P:   BCx 12/28 >>> Abx: Levaquin, start date 12/28, day 1/x.  ENDOCRINE A:   No known issues P:   SSI if glucose consistently > 180.  NEUROLOGIC A:   Acute metabolic encephalopathy Anxiety P:   Monitor clinically. Hold outpatient xanax, remeron, trazodone.   Family updated: None available.  Interdisciplinary Family Meeting v Palliative Care Meeting:  Due by: 01/05.   Montey Hora, [facility] Pgr: [phone]  or [phone] 03/10/2014, 5:29 AM  ATTENDING NOTE: I have personally reviewed patient's available data, including medical history, events of note, physical examination and test results as part of my evaluation. I have discussed with resident/NP and other careteam providers such as pharmacist, RN and RRT & co-ordinated with consultants. In addition, I personally evaluated patient and elicited key history of recent appendix abscess requiring IR drian until 26/83, COPD & diastolic CHF, AF on coumadin, exam findings of distende abdomen, clear lungs & labs showing AKI, INR 2.2, resp acidosis & acute hypercarbic resp failure. -dc bipap with distended belly -NG to LIS , should ventilate better once distension decreases -dc coumadin in case surgery required, use lovenox if INR < 2.0 Dc solumedrol, no bspasm -empiric abx  Rest per NP/medical resident whose note is outlined above and that I agree with and edited in full.    The patient is critically ill with multiple organ systems failure and requires high complexity decision making for assessment and support, frequent evaluation and titration of therapies, application of advanced monitoring technologies and  extensive interpretation of multiple databases. Critical Care Time devoted to patient care services described in this note independent of APP time is 50 minutes.   Rigoberto Noel MD

## 2014-03-09 NOTE — Telephone Encounter (Signed)
needs ED eval if abdomen distended and no BS

## 2014-03-09 NOTE — Telephone Encounter (Signed)
Noted that patient is in the ED

## 2014-03-10 DIAGNOSIS — K5669 Other intestinal obstruction: Secondary | ICD-10-CM

## 2014-03-10 DIAGNOSIS — N179 Acute kidney failure, unspecified: Secondary | ICD-10-CM

## 2014-03-10 DIAGNOSIS — J9601 Acute respiratory failure with hypoxia: Secondary | ICD-10-CM

## 2014-03-10 DIAGNOSIS — J9602 Acute respiratory failure with hypercapnia: Secondary | ICD-10-CM

## 2014-03-10 LAB — BLOOD GAS, ARTERIAL
ACID-BASE EXCESS: 9.8 mmol/L — AB (ref 0.0–2.0)
Acid-Base Excess: 10.9 mmol/L — ABNORMAL HIGH (ref 0.0–2.0)
Bicarbonate: 37.5 mEq/L — ABNORMAL HIGH (ref 20.0–24.0)
Bicarbonate: 35.9 mEq/L — ABNORMAL HIGH (ref 20.0–24.0)
DELIVERY SYSTEMS: POSITIVE
DRAWN BY: 22223
DRAWN BY: 41308
EXPIRATORY PAP: 5
Expiratory PAP: 5
FIO2: 35 %
FIO2: 0.4 %
INSPIRATORY PAP: 15
Inspiratory PAP: 15
Mode: POSITIVE
O2 SAT: 94.3 %
O2 SAT: 97.1 %
PATIENT TEMPERATURE: 37
PCO2 ART: 78.1 mmHg — AB (ref 35.0–45.0)
PCO2 ART: 70.9 mmHg — AB (ref 35.0–45.0)
Patient temperature: 98.6
RATE: 15 resp/min
TCO2: 36.5 mmol/L (ref 0–100)
TCO2: 38.1 mmol/L (ref 0–100)
pH, Arterial: 7.302 — ABNORMAL LOW (ref 7.350–7.450)
pH, Arterial: 7.325 — ABNORMAL LOW (ref 7.350–7.450)
pO2, Arterial: 75.9 mmHg — ABNORMAL LOW (ref 80.0–100.0)
pO2, Arterial: 94.4 mmHg (ref 80.0–100.0)

## 2014-03-10 LAB — PHOSPHORUS: Phosphorus: 5.7 mg/dL — ABNORMAL HIGH (ref 2.3–4.6)

## 2014-03-10 LAB — BASIC METABOLIC PANEL
ANION GAP: 17 — AB (ref 5–15)
BUN: 37 mg/dL — AB (ref 6–23)
CALCIUM: 8.5 mg/dL (ref 8.4–10.5)
CHLORIDE: 86 meq/L — AB (ref 96–112)
CO2: 33 mmol/L — ABNORMAL HIGH (ref 19–32)
CREATININE: 2.35 mg/dL — AB (ref 0.50–1.35)
GFR calc non Af Amer: 26 mL/min — ABNORMAL LOW (ref >90)
GFR, EST AFRICAN AMERICAN: 30 mL/min — AB (ref >90)
Glucose, Bld: 121 mg/dL — ABNORMAL HIGH (ref 70–99)
Potassium: 5.1 mmol/L (ref 3.5–5.1)
Sodium: 136 mmol/L (ref 135–145)

## 2014-03-10 LAB — RAPID URINE DRUG SCREEN, HOSP PERFORMED
Amphetamines: NOT DETECTED
Barbiturates: NOT DETECTED
Benzodiazepines: POSITIVE — AB
COCAINE: NOT DETECTED
Opiates: NOT DETECTED
TETRAHYDROCANNABINOL: NOT DETECTED

## 2014-03-10 LAB — CREATININE, URINE, RANDOM: Creatinine, Urine: 103.78 mg/dL

## 2014-03-10 LAB — CBC
HCT: 24.8 % — ABNORMAL LOW (ref 39.0–52.0)
Hemoglobin: 7.4 g/dL — ABNORMAL LOW (ref 13.0–17.0)
MCH: 26.9 pg (ref 26.0–34.0)
MCHC: 29.8 g/dL — ABNORMAL LOW (ref 30.0–36.0)
MCV: 90.2 fL (ref 78.0–100.0)
PLATELETS: 305 10*3/uL (ref 150–400)
RBC: 2.75 MIL/uL — ABNORMAL LOW (ref 4.22–5.81)
RDW: 18.4 % — AB (ref 11.5–15.5)
WBC: 7.7 10*3/uL (ref 4.0–10.5)

## 2014-03-10 LAB — MRSA PCR SCREENING: MRSA by PCR: NEGATIVE

## 2014-03-10 LAB — TROPONIN I: Troponin I: 0.03 ng/mL (ref <0.031)

## 2014-03-10 LAB — MAGNESIUM: Magnesium: 1.9 mg/dL (ref 1.5–2.5)

## 2014-03-10 LAB — SODIUM, URINE, RANDOM: SODIUM UR: 10 mmol/L

## 2014-03-10 LAB — AMMONIA: Ammonia: 49 umol/L — ABNORMAL HIGH (ref 11–32)

## 2014-03-10 MED ORDER — METHYLPREDNISOLONE SODIUM SUCC 40 MG IJ SOLR
40.0000 mg | Freq: Four times a day (QID) | INTRAMUSCULAR | Status: DC
  Administered 2014-03-10: 40 mg via INTRAVENOUS
  Filled 2014-03-10 (×5): qty 1

## 2014-03-10 MED ORDER — CHLORHEXIDINE GLUCONATE 0.12 % MT SOLN
15.0000 mL | Freq: Two times a day (BID) | OROMUCOSAL | Status: DC
  Administered 2014-03-10 – 2014-03-11 (×4): 15 mL via OROMUCOSAL
  Filled 2014-03-10 (×3): qty 15

## 2014-03-10 MED ORDER — CETYLPYRIDINIUM CHLORIDE 0.05 % MT LIQD
7.0000 mL | Freq: Two times a day (BID) | OROMUCOSAL | Status: DC
  Administered 2014-03-10 (×2): 7 mL via OROMUCOSAL

## 2014-03-10 MED ORDER — LEVOFLOXACIN IN D5W 750 MG/150ML IV SOLN
750.0000 mg | INTRAVENOUS | Status: DC
Start: 2014-03-11 — End: 2014-03-10

## 2014-03-10 MED ORDER — PHENOL 1.4 % MT LIQD
1.0000 | OROMUCOSAL | Status: DC | PRN
  Administered 2014-03-10: 1 via OROMUCOSAL
  Filled 2014-03-10: qty 177

## 2014-03-10 MED ORDER — BISACODYL 10 MG RE SUPP
10.0000 mg | Freq: Every day | RECTAL | Status: DC
  Administered 2014-03-10 – 2014-03-12 (×3): 10 mg via RECTAL
  Filled 2014-03-10 (×4): qty 1

## 2014-03-10 MED ORDER — IPRATROPIUM-ALBUTEROL 0.5-2.5 (3) MG/3ML IN SOLN
3.0000 mL | Freq: Four times a day (QID) | RESPIRATORY_TRACT | Status: DC
  Administered 2014-03-10 – 2014-03-15 (×23): 3 mL via RESPIRATORY_TRACT
  Filled 2014-03-10 (×26): qty 3

## 2014-03-10 MED ORDER — LEVALBUTEROL HCL 0.63 MG/3ML IN NEBU
0.6300 mg | INHALATION_SOLUTION | RESPIRATORY_TRACT | Status: DC | PRN
  Administered 2014-03-12 – 2014-03-18 (×9): 0.63 mg via RESPIRATORY_TRACT
  Filled 2014-03-10 (×11): qty 3

## 2014-03-10 MED ORDER — PIPERACILLIN-TAZOBACTAM 3.375 G IVPB
3.3750 g | Freq: Three times a day (TID) | INTRAVENOUS | Status: DC
  Administered 2014-03-10 – 2014-03-18 (×24): 3.375 g via INTRAVENOUS
  Filled 2014-03-10 (×27): qty 50

## 2014-03-10 NOTE — ED Notes (Signed)
Report given to [facility] with [facility]

## 2014-03-10 NOTE — Consult Note (Signed)
[facility] Surgery Consult Note  Quinten Allerton Brett 1940/04/11  [id number].    Requesting MD: Dr. Elsworth Soho Chief Complaint/Reason for Consult: SBO  HPI:  73 y/o AA male with PMH Afib on coumadin, diastolic HF, Pulmonary HTN, COPD, HTN lung nodule presents to [facility] on 03/09/14 secondary to AMS and abdominal pain/distension for 1 week. He had recent admission to [facility] 12/1 - 12/17 for appendiceal abscess and had a drain placed by IR (removed 12/13). He did not have a lap chole, the was treated conservatively with the drain and IV antibiotics.  He was discharged home with home health RN and PT. He was seen by PCP just one day prior to presentation and was felt to be gradually improving. On 12/28, he complained of SOB and was sent to ED. In ED, CT abd / pelvis revealed findings concerning for SBO. In addition, he had acute encephalopathy, AKI, weakness, intermittent AF with RVR, and mildly elevated troponin.  He was transferred to [facility] for further management by CCM.    He appears less encephalopathic today as he is able to answer most of my questions.  The patient tells me he has been having abdominal distension and pain on and off for >1week.  He currently does not have any pain.  No radicular pain, no precipitating/alleviating factors.  He's noticed no BM's in 5 days.  He admits he is having flatus.  +SOB, no chest pain, urinating well, low appetite.     ROS: All systems reviewed and otherwise negative except for as above  Family History  Problem Relation Age of Onset  . Cancer Mother   . Cancer Father   . Coronary artery disease Brother   . Colon cancer Brother     Past Medical History  Diagnosis Date  . Glaucoma     uses eye drops daily  . Allergic rhinitis     takes Singulair at bedtime  . Atrial fibrillation     takes Coumadin daily  . Diastolic heart failure     LVEF 38-32%, rate 2 diastolic dysfunction 11/1914  . Pulmonary hypertension     70 mmHg 03/2012  . Asthma      Albuterol neb and inhaler as needed  . Anxiety     takes Xanax daily as needed  . COPD (chronic obstructive pulmonary disease)     Symbicort daily  . Essential hypertension, benign     takes Metoprolol and Diltiazem daily  . Anemia     takes ferrous sulfate daily  . History of bronchitis   . Shortness of breath     with exertion  . Numbness     fingertips on both hands  . Lung nodules   . History of blood transfusion     Past Surgical History  Procedure Laterality Date  . Bilateral cataract surgery    . Herniorrhapy    . Tendon repair      Right hand surgical procedure for a tendon repair  . Colonoscopy N/A 11/29/2012    Procedure: COLONOSCOPY;  Surgeon: Danie Binder, MD;  [facility];  Service: Endoscopy;  Laterality: N/A;  1:00  . Esophagogastroduodenoscopy N/A 11/29/2012    Procedure: ESOPHAGOGASTRODUODENOSCOPY (EGD);  Surgeon: Danie Binder, MD;  [facility];  Service: Endoscopy;  Laterality: N/A;  . Eye surgery    . Hernia repair    . Radiology with anesthesia N/A 07/10/2013    Procedure: EMBOLIZATION-RADIOLOGY WITH ANESTHESIA;  Surgeon: Rob Hickman, MD;  Location: [place];  Service: Radiology;  Laterality: N/A;  . Nose surgery      d/t nosebleeds  . Video bronchoscopy with endobronchial ultrasound N/A 11/21/2013    Procedure: VIDEO BRONCHOSCOPY WITH ENDOBRONCHIAL ULTRASOUND;  Surgeon: Melrose Nakayama, MD;  Location: [place];  Service: Thoracic;  Laterality: N/A;  . Mediastinoscopy N/A 11/21/2013    Procedure: MEDIASTINOSCOPY;  Surgeon: Melrose Nakayama, MD;  Location: [place];  Service: Thoracic;  Laterality: N/A;    Social History:  reports that he has been smoking Cigarettes.  He has a 30 pack-year smoking history. He has never used smokeless tobacco. He reports that he does not drink alcohol or use illicit drugs.  Allergies:  Allergies  Allergen Reactions  . Aspirin Other (See Comments)    On coumadin    Medications Prior to  Admission  Medication Sig Dispense Refill  . albuterol (PROAIR HFA) 108 (90 BASE) MCG/ACT inhaler Inhale 2 puffs into the lungs every 6 (six) hours as needed for wheezing or shortness of breath. 6.7 g 4  . albuterol (PROVENTIL) (2.5 MG/3ML) 0.083% nebulizer solution Take 2.5 mg by nebulization every 8 (eight) hours as needed for wheezing or shortness of breath.    . allopurinol (ZYLOPRIM) 300 MG tablet GIVE "Michai" 1 TABLET BY MOUTH EVERY DAILY 30 tablet 2  . ALPRAZolam (XANAX) 0.25 MG tablet Take 0.25 mg by mouth daily as needed for anxiety.    . brimonidine-timolol (COMBIGAN) 0.2-0.5 % ophthalmic solution Place 1 drop into both eyes every 12 (twelve) hours.    . budesonide-formoterol (SYMBICORT) 160-4.5 MCG/ACT inhaler Inhale 2 puffs into the lungs 2 (two) times daily. 1 Inhaler 4  . diltiazem (CARDIZEM CD) 180 MG 24 hr capsule Take 1 capsule (180 mg total) by mouth daily. 30 capsule 6  . ferrous sulfate 325 (65 FE) MG tablet Take 1 tablet (325 mg total) by mouth 2 (two) times daily with a meal.  3  . mirtazapine (REMERON) 15 MG tablet Take 1 tablet (15 mg total) by mouth at bedtime. 30 tablet 4  . montelukast (SINGULAIR) 10 MG tablet Take 10 mg by mouth at bedtime.    . potassium chloride (K-DUR) 10 MEQ tablet GIVE "Omarrion" 1 TABLET BY MOUTH TWICE DAILY 60 tablet 2  . senna (SENOKOT) 8.6 MG TABS tablet Take 1 tablet (8.6 mg total) by mouth 2 (two) times daily. 120 each 0  . tiotropium (SPIRIVA) 18 MCG inhalation capsule Place 1 capsule (18 mcg total) into inhaler and inhale daily. 30 capsule 2  . torsemide (DEMADEX) 20 MG tablet Take 2 tablets (40 mg total) by mouth daily. 60 tablet 3  . traMADol (ULTRAM) 50 MG tablet Take 50 mg by mouth daily as needed (pain).    Marland Kitchen travoprost, benzalkonium, (TRAVATAN) 0.004 % ophthalmic solution Place 1 drop into both eyes at bedtime.    Derrill Memo ON 03/13/2014] traZODone (DESYREL) 50 MG tablet Take 0.5-1 tablets (25-50 mg total) by mouth at bedtime as needed  for sleep. 30 tablet 5  . warfarin (COUMADIN) 4 MG tablet Take 1.5 tabs (6 mg) on Monday and Thursday and 1 tab (8m) on other days 40 tablet 3  . enoxaparin (LOVENOX) 60 MG/0.6ML injection Inject 0.6 mLs (60 mg total) into the skin every 12 (twelve) hours. (Patient not taking: Reported on 03/09/2014) 14 Syringe 0  . traMADol (ULTRAM) 50 MG tablet TAKE 1 TABLET BY MOUTH EVERY DAY AS NEEDED (Patient not taking: Reported on 03/09/2014) 30 tablet 3  Blood pressure 104/66, pulse 57, temperature 96.7 F (35.9 C), temperature source Axillary, resp. rate 16, height _0  (1.676 m), SpO2 100 %. Physical Exam: General: pleasant, WD, malnourished, very thin AA male who is laying in bed in NAD HEENT: head is normocephalic, atraumatic.  Sclera are noninjected.  PERRL.  Ears and nose without any masses or lesions.  Mouth is pink but dry Heart: regular, rate, and rhythm.  No obvious murmurs, gallops, or rubs noted.  Palpable pedal pulses bilaterally Lungs: CTAB, no wheezes, rhonchi, or rales noted.  Respiratory effort nonlabored Abd: tight, grossly distended, not very tender, diminished BS, no masses, hernias, or organomegaly, scar in RLQ where drain was well healed, no surgical scars MS: all 4 extremities are symmetrical with no cyanosis, clubbing, or edema. Skin: warm and dry with no masses, lesions, or rashes Psych: A&Ox3 with an appropriate affect.   Results for orders placed or performed during the hospital encounter of 03/09/14 (from the past 48 hour(s))  Comprehensive metabolic panel     Status: Abnormal   Collection Time: 03/09/14  3:33 PM  Result Value Ref Range   Sodium 140 135 - 145 mmol/L    Comment: Please note change in reference range.   Potassium 4.2 3.5 - 5.1 mmol/L    Comment: Please note change in reference range.   Chloride 91 (L) 96 - 112 mEq/L   CO2 37 (H) 19 - 32 mmol/L   Glucose, Bld 114 (H) 70 - 99 mg/dL   BUN 35 (H) 6 - 23 mg/dL   Creatinine, Ser 2.27 (H) 0.50 - 1.35  mg/dL   Calcium 9.4 8.4 - 10.5 mg/dL   Total Protein 8.3 6.0 - 8.3 g/dL   Albumin 3.3 (L) 3.5 - 5.2 g/dL   AST 23 0 - 37 U/L   ALT 12 0 - 53 U/L   Alkaline Phosphatase 96 39 - 117 U/L   Total Bilirubin 1.0 0.3 - 1.2 mg/dL   GFR calc non Af Amer 27 (L) >90 mL/min   GFR calc Af Amer 31 (L) >90 mL/min    Comment: (NOTE) The eGFR has been calculated using the CKD EPI equation. This calculation has not been validated in all clinical situations. eGFR's persistently <90 mL/min signify possible Chronic Kidney Disease.    Anion gap 12 5 - 15  Lipase, blood     Status: None   Collection Time: 03/09/14  3:33 PM  Result Value Ref Range   Lipase 15 11 - 59 U/L  CBC with Differential     Status: Abnormal   Collection Time: 03/09/14  3:33 PM  Result Value Ref Range   WBC 8.8 4.0 - 10.5 K/uL   RBC 3.27 (L) 4.22 - 5.81 MIL/uL   Hemoglobin 9.1 (L) 13.0 - 17.0 g/dL   HCT 30.1 (L) 39.0 - 52.0 %   MCV 92.0 78.0 - 100.0 fL   MCH 27.8 26.0 - 34.0 pg   MCHC 30.2 30.0 - 36.0 g/dL   RDW 18.2 (H) 11.5 - 15.5 %   Platelets 343 150 - 400 K/uL   Neutrophils Relative % 81 (H) 43 - 77 %   Neutro Abs 7.0 1.7 - 7.7 K/uL   Lymphocytes Relative 6 (L) 12 - 46 %   Lymphs Abs 0.5 (L) 0.7 - 4.0 K/uL   Monocytes Relative 13 (H) 3 - 12 %   Monocytes Absolute 1.2 (H) 0.1 - 1.0 K/uL   Eosinophils Relative 0 0 - 5 %  Eosinophils Absolute 0.0 0.0 - 0.7 K/uL   Basophils Relative 0 0 - 1 %   Basophils Absolute 0.0 0.0 - 0.1 K/uL  Protime-INR     Status: Abnormal   Collection Time: 03/09/14  3:33 PM  Result Value Ref Range   Prothrombin Time 45.8 (H) 11.6 - 15.2 seconds   INR 4.87 (H) 0.00 - 1.49  Troponin I     Status: Abnormal   Collection Time: 03/09/14  3:33 PM  Result Value Ref Range   Troponin I 0.04 (H) <0.031 ng/mL    Comment:        PERSISTENTLY INCREASED TROPONIN VALUES IN THE RANGE OF 0.04-0.49 ng/mL CAN BE SEEN IN:       -UNSTABLE ANGINA       -CONGESTIVE HEART FAILURE       -MYOCARDITIS        -CHEST TRAUMA       -ARRYHTHMIAS       -LATE PRESENTING MYOCARDIAL INFARCTION       -COPD   CLINICAL FOLLOW-UP RECOMMENDED. Please note change in reference range.   Brain natriuretic peptide     Status: Abnormal   Collection Time: 03/09/14  3:33 PM  Result Value Ref Range   B Natriuretic Peptide 720.0 (H) 0.0 - 100.0 pg/mL    Comment: Please note change in reference range.  Culture, blood (routine x 2)     Status: None (Preliminary result)   Collection Time: 03/09/14  3:33 PM  Result Value Ref Range   Specimen Description BLOOD RIGHT ARM    Special Requests BOTTLES DRAWN AEROBIC AND ANAEROBIC 6CC BOTTLES    Culture NO GROWTH 1 DAY    Report Status PENDING   Culture, blood (routine x 2)     Status: None (Preliminary result)   Collection Time: 03/09/14  5:44 PM  Result Value Ref Range   Specimen Description BLOOD RIGHT ARM    Special Requests BOTTLES DRAWN AEROBIC AND ANAEROBIC 10CC BOTTLES    Culture NO GROWTH 1 DAY    Report Status PENDING   Lactic acid, plasma     Status: None   Collection Time: 03/09/14  5:44 PM  Result Value Ref Range   Lactic Acid, Venous 1.1 0.5 - 2.2 mmol/L  Troponin I     Status: Abnormal   Collection Time: 03/09/14 10:39 PM  Result Value Ref Range   Troponin I 0.04 (H) <0.031 ng/mL    Comment:        PERSISTENTLY INCREASED TROPONIN VALUES IN THE RANGE OF 0.04-0.49 ng/mL CAN BE SEEN IN:       -UNSTABLE ANGINA       -CONGESTIVE HEART FAILURE       -MYOCARDITIS       -CHEST TRAUMA       -ARRYHTHMIAS       -LATE PRESENTING MYOCARDIAL INFARCTION       -COPD   CLINICAL FOLLOW-UP RECOMMENDED. Please note change in reference range.   Blood gas, arterial     Status: Abnormal   Collection Time: 03/09/14 10:50 PM  Result Value Ref Range   O2 Content 6.0 L/min   Delivery systems NASAL CANNULA    pH, Arterial 7.230 (L) 7.350 - 7.450   pCO2 arterial 88.6 (HH) 35.0 - 45.0 mmHg    Comment: CRITICAL RESULT CALLED TO, READ BACK BY AND VERIFIED WITH: TINA  TALBOT RN BY K KNICK RRT RCP ON 03/09/2014 AT 2255    pO2, Arterial 116.0 (H) 80.0 - 100.0  mmHg   Bicarbonate 35.8 (H) 20.0 - 24.0 mEq/L   TCO2 35.2 0 - 100 mmol/L   Acid-Base Excess 8.4 (H) 0.0 - 2.0 mmol/L   O2 Saturation 97.3 %   Patient temperature 37.0    Collection site RIGHT RADIAL    Drawn by [id number]    Sample type ARTERIAL    Allens test (pass/fail) PASS PASS  Ammonia     Status: Abnormal   Collection Time: 03/09/14 11:09 PM  Result Value Ref Range   Ammonia 49 (H) 11 - 32 umol/L    Comment: Please note change in reference range.  Blood gas, arterial ([facility] & [facility] ONLY)     Status: Abnormal   Collection Time: 03/10/14  1:23 AM  Result Value Ref Range   FIO2 35.00 %   Inspiratory PAP 15    Expiratory PAP 5    pH, Arterial 7.302 (L) 7.350 - 7.450   pCO2 arterial 78.1 (HH) 35.0 - 45.0 mmHg    Comment: CRITICAL RESULT CALLED TO, READ BACK BY AND VERIFIED WITH:  TINA TALBOTT,RN BY K KNICK RRT RCP ON 03/10/2014 AT 0126    pO2, Arterial 75.9 (L) 80.0 - 100.0 mmHg   Bicarbonate 37.5 (H) 20.0 - 24.0 mEq/L   TCO2 36.5 0 - 100 mmol/L   Acid-Base Excess 10.9 (H) 0.0 - 2.0 mmol/L   O2 Saturation 94.3 %   Patient temperature 37.0    Collection site RIGHT RADIAL    Drawn by [id number]    Sample type ARTERIAL    Allens test (pass/fail) PASS PASS  Urine rapid drug screen (hosp performed)     Status: Abnormal   Collection Time: 03/10/14  5:39 AM  Result Value Ref Range   Opiates NONE DETECTED NONE DETECTED   Cocaine NONE DETECTED NONE DETECTED   Benzodiazepines POSITIVE (A) NONE DETECTED   Amphetamines NONE DETECTED NONE DETECTED   Tetrahydrocannabinol NONE DETECTED NONE DETECTED   Barbiturates NONE DETECTED NONE DETECTED    Comment:        DRUG SCREEN FOR MEDICAL PURPOSES ONLY.  IF CONFIRMATION IS NEEDED FOR ANY PURPOSE, NOTIFY LAB WITHIN 5 DAYS.        LOWEST DETECTABLE LIMITS FOR URINE DRUG SCREEN Drug Class       Cutoff (ng/mL) Amphetamine      1000 Barbiturate       200 Benzodiazepine   546 Tricyclics       568 Opiates          300 Cocaine          300 THC              50   Sodium, urine, random     Status: None   Collection Time: 03/10/14  5:39 AM  Result Value Ref Range   Sodium, Ur 10 mmol/L  Creatinine, urine, random     Status: None   Collection Time: 03/10/14  5:39 AM  Result Value Ref Range   Creatinine, Urine 103.78 mg/dL  Blood gas, arterial     Status: Abnormal   Collection Time: 03/10/14  5:40 AM  Result Value Ref Range   FIO2 0.40 %   Delivery systems BILEVEL POSITIVE AIRWAY PRESSURE    Mode BILEVEL POSITIVE AIRWAY PRESSURE    Rate 15 resp/min   Inspiratory PAP 15    Expiratory PAP 5    pH, Arterial 7.325 (L) 7.350 - 7.450   pCO2 arterial 70.9 (HH) 35.0 - 45.0 mmHg    Comment:  CRITICAL RESULT CALLED TO, READ BACK BY AND VERIFIED WITH: Jaynie Bream, RN AT [phone] BY [place] [employer]ers JOYCE RRT ON 03/10/2014    pO2, Arterial 94.4 80.0 - 100.0 mmHg   Bicarbonate 35.9 (H) 20.0 - 24.0 mEq/L   TCO2 38.1 0 - 100 mmol/L   Acid-Base Excess 9.8 (H) 0.0 - 2.0 mmol/L   O2 Saturation 97.1 %   Patient temperature 98.6    Collection site RIGHT RADIAL    Drawn by [phone]    Sample type ARTERIAL    Allens test (pass/fail) PASS PASS  MRSA PCR Screening     Status: None   Collection Time: 03/10/14  5:47 AM  Result Value Ref Range   MRSA by PCR NEGATIVE NEGATIVE    Comment:        The GeneXpert MRSA Assay (FDA approved for NASAL specimens only), is one component of a comprehensive MRSA colonization surveillance program. It is not intended to diagnose MRSA infection nor to guide or monitor treatment for MRSA infections.   CBC     Status: Abnormal   Collection Time: 03/10/14  7:59 AM  Result Value Ref Range   WBC 7.7 4.0 - 10.5 K/uL   RBC 2.75 (L) 4.22 - 5.81 MIL/uL   Hemoglobin 7.4 (L) 13.0 - 17.0 g/dL   HCT 24.8 (L) 39.0 - 52.0 %   MCV 90.2 78.0 - 100.0 fL   MCH 26.9 26.0 - 34.0 pg   MCHC 29.8 (L) 30.0 - 36.0 g/dL   RDW 18.4 (H) 11.5 -  15.5 %   Platelets 305 150 - 400 K/uL  Basic metabolic panel     Status: Abnormal   Collection Time: 03/10/14  7:59 AM  Result Value Ref Range   Sodium 136 135 - 145 mmol/L    Comment: Please note change in reference range.   Potassium 5.1 3.5 - 5.1 mmol/L    Comment: Please note change in reference range.   Chloride 86 (L) 96 - 112 mEq/L   CO2 33 (H) 19 - 32 mmol/L   Glucose, Bld 121 (H) 70 - 99 mg/dL   BUN 37 (H) 6 - 23 mg/dL   Creatinine, Ser 2.35 (H) 0.50 - 1.35 mg/dL   Calcium 8.5 8.4 - 10.5 mg/dL   GFR calc non Af Amer 26 (L) >90 mL/min   GFR calc Af Amer 30 (L) >90 mL/min    Comment: (NOTE) The eGFR has been calculated using the CKD EPI equation. This calculation has not been validated in all clinical situations. eGFR's persistently <90 mL/min signify possible Chronic Kidney Disease.    Anion gap 17 (H) 5 - 15  Magnesium     Status: None   Collection Time: 03/10/14  7:59 AM  Result Value Ref Range   Magnesium 1.9 1.5 - 2.5 mg/dL  Phosphorus     Status: Abnormal   Collection Time: 03/10/14  7:59 AM  Result Value Ref Range   Phosphorus 5.7 (H) 2.3 - 4.6 mg/dL   Ct Abdomen Pelvis Wo Contrast  03/09/2014   CLINICAL DATA:  Initial evaluation for abdominal distension and nausea. No bowel movement and 4 days. Recent perforated appendix.  EXAM: CT ABDOMEN AND PELVIS WITHOUT CONTRAST  TECHNIQUE: Multidetector CT imaging of the abdomen and pelvis was performed following the standard protocol without IV contrast.  COMPARISON:  Prior radiograph from earlier the same day as well as prior CT from 02/20/2014.  FINDINGS: Small layering bilateral pleural effusions with associated bibasilar opacity present, which may  reflect atelectasis or consolidation. There is prominent cardiomegaly. Scattered coronary artery and mitral valvular calcifications present. No pericardial effusion.  Limited noncontrast evaluation of the liver is within normal limits. Gallbladder are mildly distended without  associated inflammatory changes. No biliary dilatation. Spleen, adrenal glands, and pancreas within normal limits.  Kidneys within normal limits without nephrolithiasis or hydronephrosis.  Stomach is markedly distended with air contrast level present 6 cm in diameter with scattered internal air-fluid levels. There is an apparent transition point within knee lower right abdomen (series 2, image 57). Findings compatible with acute small bowel obstruction. The small bowel is fluid filled distally but of normal caliber. The colon is decompressed. Moderate amount of retained stool within the ascending and proximal transverse colon. No free intraperitoneal air. Small volume ascites present adjacent to the liver.  Previously seen percutaneous pigtail drain has been removed from the right lower quadrant.  Bladder decompressed with a Foley catheter in place. Prostate normal.  Right inguinal lymph nodes measuring up to 1.3 cm in short axis noted, similar to prior, and may be reactive in nature. No other pathologically enlarged intra-abdominal pelvic lymph nodes identified. Present Moderate atheromatous plaque present within the intra-abdominal aorta and its branch vessels. No aneurysm.  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 No acute osseus abnormality. No worrisome lytic or blastic osseous lesions.  IMPRESSION: 1. Multiple prominent dilated loops of proximal and mid small bowel within the upper and mid abdomen, markedly increased in caliber relative to recent CT from 02/24/2014. There is a suspected transition point in the right lower quadrant, concerning for mechanical small bowel obstruction. No free intraperitoneal air. 2. Small volume free perihepatic fluid. 3. Small bilateral pleural effusions with associated bibasilar atelectasis/consolidation. 4. Right inguinal adenopathy measuring up to 1.3 cm in short axis, similar to prior. These nodes are suspected to be reactive in nature.  5. Moderate cardiomegaly.   Electronically Signed   By: Jeannine Boga M.D.   On: 03/09/2014 21:16   Ct Head Wo Contrast  03/10/2014   CLINICAL DATA:  Encephalopathy  EXAM: CT HEAD WITHOUT CONTRAST  TECHNIQUE: Contiguous axial images were obtained from the base of the skull through the vertex without intravenous contrast.  COMPARISON:  10/03/2013  FINDINGS: Motion degraded images.  No evidence of parenchymal hemorrhage or extra-axial fluid collection. No mass lesion, mass effect, or midline shift.  No CT evidence of acute infarction.  Subcortical white matter and periventricular small vessel ischemic changes. Intracranial atherosclerosis.  Age related atrophy.  No ventriculomegaly.  The visualized paranasal sinuses are essentially clear. The mastoid air cells are unopacified.  No evidence of calvarial fracture.  IMPRESSION: Motion degraded images.  No evidence of acute intracranial abnormality.  Atrophy with small vessel ischemic changes and intracranial atherosclerosis.   Electronically Signed   By: Julian Hy M.D.   On: 03/10/2014 00:10   Dg Abd Acute W/chest  03/09/2014   CLINICAL DATA:  Abdominal distension and abdominal pain for 4 weeks. History of ruptured bladder.  EXAM: ACUTE ABDOMEN SERIES (ABDOMEN 2 VIEW & CHEST 1 VIEW)  COMPARISON:  02/26/2014.  FINDINGS: Compared to prior, pulmonary aeration has improved although there is still elevation of both hemidiaphragms and low lung volumes with basilar atelectasis. Cardiopericardial silhouette remains enlarged. Monitoring leads project over the chest. No pulmonary edema or focal airspace consolidation identified.  On the upright abdominal images, there is gas that extends across the central diaphragms which may be within colon but is concerning for free  air and decubitus radiographs are recommended. This gas-filled structure does not have the expected mural markings for small bowel, large bowel or stomach.  Large amount of stool is present  along the ascending colon. The previously seen RIGHT lower quadrant drain has been removed. On the supine radiographs, there appears to be a dilated loop of small bowel extending across the central abdomen, measuring up to 5 cm. On the upright images, this has a fluid level. There is stool and bowel gas extending along the descending colon however because of technical degradation of the study. Rectal gas cannot be evaluated.  IMPRESSION: 1. Findings suspicious for free air in the abdomen. Decubitus radiographs recommended. Critical Value/emergent results were called by telephone at the time of interpretation on 03/09/2014 at 4:49 pm to Dr. Fredia Sorrow , who verbally acknowledged these results. 2. Low volume chest with cardiomegaly. 3. Dilated loop of small bowel measuring up to 5 cm suspicious for obstruction. 4. Interval removal of RIGHT lower quadrant percutaneous strain.   Electronically Signed   By: Dereck Ligas M.D.   On: 03/09/2014 16:51   Dg Abd Decub  03/09/2014   CLINICAL DATA:  Suspected intraperitoneal free air, followup assess.  EXAM: ABDOMEN - 1 VIEW DECUBITUS  COMPARISON:  Abdominal radiographs March 09, 2014 at 1614 hr  FINDINGS: Limited assessment as RIGHT-sided imaged down. Large gaseous distended structure with air-fluid level in mid abdomen, in addition to a few bubbles of potentially intraperitoneal free air.  IMPRESSION: Suspected pneumoperitoneum on this limited RIGHT lateral decubitus examination. Large central abdomen air-fluid level, it is unclear if this reflects bowel/stomach or possible abscess. Recommend CT of the abdomen and pelvis with contrast.   Electronically Signed   By: Elon Alas   On: 03/09/2014 17:44     Assessment/Plan Admitted [facility] 03/09/14 for SOB and AMS, Transferred to [facility] 03/10/14 Recent admission 12/1-12/17 perforated appendicitis with abscess s/p perc drain 12/1 - 12/13 Now with findings concerning for SBO Right inguinal adenopathy - likely  reactive B/L pleural effusion AKI Acute encephalopathy  Plan: 1.  CCM managing, we will consult 2.  Conservative management with NPO, NG tube, bowel rest, IVF, pain control, antiemetics 3.  SCD's and lovenox for DVT proph 4.  Ambulate and IS 5.  Given his recent appendix perforation with abscess and conservative management he very well may have scar tissue which formed and has caused an obstruction or it may be an ileus.  He does have a lot of stool in his rectal vault.  Will try suppositories.  Hopefully he will resolve with conservative management and not require surgical interventions.      Coralie Keens, [facility] 03/10/2014, 12:18 PM Pager: [phone]

## 2014-03-10 NOTE — ED Notes (Signed)
CRITICAL VALUE ALERT  Critical value received:  CO2 78.1 Date of notification: 03/10/2014  Time of notification 0128  Critical value read back:Yes.    Nurse who received alert:  Fabio Neighbors RN  MD notified (1st page):  Dr. Ernestina Patches   Time of first page:  0131

## 2014-03-10 NOTE — Progress Notes (Signed)
Patient active with [facility]. [facility] will continue to follow. Will follow up with patient at bedside at later time. Left voicemail to make inpatient RNCM aware of patient being [facility] active.  Marthenia Rolling, MSN- [facility] [phone]

## 2014-03-10 NOTE — ED Notes (Signed)
Said nurse spoke with pt's sister and she stated she would like for pt to be a full code; pt does not have a POA; sister informed of pt's critical condition and need for transfer to [facility] ICU; pt's sister given room number and phone number to [facility];

## 2014-03-10 NOTE — Progress Notes (Signed)
Pt arrived to 2S room 16 with carelink, pt currently on their bipap, and switched to ours at this time. See docflowsheets for settings. Will continue to monitor.

## 2014-03-10 NOTE — Progress Notes (Addendum)
ANTICOAGULATION and ANTIBIOTIC CONSULT NOTE - Initial Consult  Pharmacy Consult for Warfarin (HOLDING) Indication: atrial fibrillation   Pharmacy Consult for Zosyn Indication: empiric for intra-abd infection  Allergies  Allergen Reactions  . Aspirin Other (See Comments)    On coumadin    Patient Measurements: Height: 5\' 6"  (167.6 cm) IBW/kg (Calculated) : 63.8  Vital Signs: BP: 115/62 mmHg (12/29 0300) Pulse Rate: 67 (12/29 0300)  Labs:  Recent Labs  03/09/14 03/09/14 1533 03/09/14 2239  HGB  --  9.1*  --   HCT  --  30.1*  --   PLT  --  343  --   LABPROT  --  45.8*  --   INR 7.0 4.87*  --   CREATININE  --  2.27*  --   TROPONINI  --  0.04* 0.04*    Estimated Creatinine Clearance: 25.8 mL/min (by C-G formula based on Cr of 2.27).   Medical History: Past Medical History  Diagnosis Date  . Glaucoma     uses eye drops daily  . Allergic rhinitis     takes Singulair at bedtime  . Atrial fibrillation     takes Coumadin daily  . Diastolic heart failure     LVEF 44-96%, rate 2 diastolic dysfunction 09/5914  . Pulmonary hypertension     70 mmHg 03/2012  . Asthma     Albuterol neb and inhaler as needed  . Anxiety     takes Xanax daily as needed  . COPD (chronic obstructive pulmonary disease)     Symbicort daily  . Essential hypertension, benign     takes Metoprolol and Diltiazem daily  . Anemia     takes ferrous sulfate daily  . History of bronchitis   . Shortness of breath     with exertion  . Numbness     fingertips on both hands  . Lung nodules   . History of blood transfusion     Assessment: 73 y.o. male admitted with AMS, SOB. CT abd concerning for SBO. Recent admission 12/1-12/7 for appendiceal abscess with drain placed by IR (removed 12/13).  Anticoagulation: Coumadin PTA for afib (admit INR 4.87; home dose 4mg  daily except for 6mg  on Mon/Thur - last dose 12/27) . Holding coumadin as surgery may be needed. Plan for Lovenox per Rx once INR < 2.    Infectious Disease: Zosyn D#1 for intraabd infection. Afeb. WBC wnl.  12/29 Levaquin>>12/29 12/29 Zosyn>>  12/28 Bld x2>>  Nephrology: ARF. Scr up to 2.27 (baseline 0.9), est CrCl 25 ml/min. Lytes ok.  Goal of Therapy:  INR 2-3 Monitor platelets by anticoagulation protocol: Yes   Plan:  Levaquin 750mg  IV q48h - ok to d/c Levaquin per Dr. Elsworth Soho - lungs clear Zosyn 3.375gm IV q8h - each dose over 4 hours Will f/u micro data, pt's clinical condition, and renal function Coumadin on hold F/u INR - noted plan for Lovenox once INR <2  Sherlon Handing, PharmD, BCPS Clinical pharmacist, pager [phone] 03/10/2014,5:43 AM

## 2014-03-10 NOTE — ED Notes (Signed)
Report given to Duanne Moron on 2S

## 2014-03-11 ENCOUNTER — Inpatient Hospital Stay (HOSPITAL_COMMUNITY): Payer: Medicare Other

## 2014-03-11 LAB — CBC
HEMATOCRIT: 23.7 % — AB (ref 39.0–52.0)
Hemoglobin: 7.2 g/dL — ABNORMAL LOW (ref 13.0–17.0)
MCH: 27 pg (ref 26.0–34.0)
MCHC: 30.4 g/dL (ref 30.0–36.0)
MCV: 88.8 fL (ref 78.0–100.0)
PLATELETS: 281 10*3/uL (ref 150–400)
RBC: 2.67 MIL/uL — ABNORMAL LOW (ref 4.22–5.81)
RDW: 18 % — AB (ref 11.5–15.5)
WBC: 6.9 10*3/uL (ref 4.0–10.5)

## 2014-03-11 LAB — BLOOD GAS, ARTERIAL
ACID-BASE EXCESS: 9.7 mmol/L — AB (ref 0.0–2.0)
Bicarbonate: 35.7 mEq/L — ABNORMAL HIGH (ref 20.0–24.0)
Drawn by: 41308
O2 Content: 3.5 L/min
O2 Saturation: 98.8 %
PCO2 ART: 69.5 mmHg — AB (ref 35.0–45.0)
PO2 ART: 122 mmHg — AB (ref 80.0–100.0)
Patient temperature: 98.6
TCO2: 37.8 mmol/L (ref 0–100)
pH, Arterial: 7.331 — ABNORMAL LOW (ref 7.350–7.450)

## 2014-03-11 LAB — BASIC METABOLIC PANEL
Anion gap: 13 (ref 5–15)
BUN: 45 mg/dL — ABNORMAL HIGH (ref 6–23)
CALCIUM: 8.4 mg/dL (ref 8.4–10.5)
CO2: 38 mmol/L — AB (ref 19–32)
CREATININE: 2.19 mg/dL — AB (ref 0.50–1.35)
Chloride: 89 mEq/L — ABNORMAL LOW (ref 96–112)
GFR calc Af Amer: 32 mL/min — ABNORMAL LOW (ref >90)
GFR, EST NON AFRICAN AMERICAN: 28 mL/min — AB (ref >90)
GLUCOSE: 113 mg/dL — AB (ref 70–99)
Potassium: 4.8 mmol/L (ref 3.5–5.1)
Sodium: 140 mmol/L (ref 135–145)

## 2014-03-11 LAB — OCCULT BLOOD X 1 CARD TO LAB, STOOL: FECAL OCCULT BLD: POSITIVE — AB

## 2014-03-11 LAB — MAGNESIUM: Magnesium: 2.1 mg/dL (ref 1.5–2.5)

## 2014-03-11 LAB — PHOSPHORUS: Phosphorus: 5 mg/dL — ABNORMAL HIGH (ref 2.3–4.6)

## 2014-03-11 LAB — PROTIME-INR
INR: 7.71 — AB (ref 0.00–1.49)
Prothrombin Time: 65.6 seconds — ABNORMAL HIGH (ref 11.6–15.2)

## 2014-03-11 MED ORDER — BUDESONIDE 0.5 MG/2ML IN SUSP
0.5000 mg | Freq: Two times a day (BID) | RESPIRATORY_TRACT | Status: DC
  Administered 2014-03-12 – 2014-03-15 (×8): 0.5 mg via RESPIRATORY_TRACT
  Filled 2014-03-11 (×13): qty 2

## 2014-03-11 MED ORDER — DEXTROSE 5 % IV SOLN
5.0000 mg | Freq: Once | INTRAVENOUS | Status: AC
  Administered 2014-03-11: 5 mg via INTRAVENOUS
  Filled 2014-03-11: qty 0.5

## 2014-03-11 NOTE — Progress Notes (Signed)
Came to visit Theodore [name] at bedside as he is active with [facility]. Discussed with him possibly needing short-term SNF placement at hospital discharge. He states his plan is to go home and not to go to rehab post discharge. Will continue to follow and continue to encourage patient to consider SNF placement at discharge if he remains weak and deconditioned throughout hospitalization. Spoke with inpatient RNCM prior to visiting patient at bedside.  [facility] [phone]

## 2014-03-11 NOTE — Progress Notes (Signed)
PULMONARY / CRITICAL CARE MEDICINE   Name: Theodore [name] MRN: [id number] DOB: Jun 22, 1940    ADMISSION DATE:  03/09/2014 CONSULTATION DATE:  03/11/2014  REFERRING MD :  EDP  CHIEF COMPLAINT:  AMS  INITIAL PRESENTATION:  73 y.o. M brought to [facility] ED 12/28 for SOB and AMS.  Had recent admission 12/1 - 12/17 for appendiceal abscess.  In ED, found to have SBO, AKI, acute encephalopathy.  Pt transferred to [facility] for further care, PCCM consulted..     STUDIES:  Abd 12/28 >>> findings suspicious for free air, dilated loop of SB suspicious for SBO. CT abd / pelv 12/28 >>> concerning for SBO, no free air, small volume perihepatic fluid, small bilateral pleural effusions, right inguinal adenopathy suspected to be reactive, mild cardiomegaly. CT head 12/29 >>> no acute process Echo 12/29 >>>  SIGNIFICANT EVENTS: 12/28 - admitted with SBO appy drain  12/1- 12/ 13  SUBJECTIVE: denies abd pain Afebrile Good UO, NG about 500cc  VITAL SIGNS: Temp:  [96.7 F (35.9 C)-98.3 F (36.8 C)] 97.5 F (36.4 C) (12/30 0710) Pulse Rate:  [57-110] 110 (12/30 0600) Resp:  [15-28] 19 (12/30 0800) BP: (91-129)/(48-70) 107/58 mmHg (12/30 0800) SpO2:  [90 %-100 %] 100 % (12/30 0800) FiO2 (%):  [40 %] 40 % (12/29 1000) Weight:  [60.3 kg (132 lb 15 oz)] 60.3 kg (132 lb 15 oz) (12/30 0600) HEMODYNAMICS:   VENTILATOR SETTINGS: Vent Mode:  [-] BIPAP FiO2 (%):  [40 %] 40 % Set Rate:  [15 bmp] 15 bmp INTAKE / OUTPUT: Intake/Output      12/29 0701 - 12/30 0700 12/30 0701 - 12/31 0700   I.V. (mL/kg) 1550 (25.7)    NG/GT 180    IV Piggyback 150    Total Intake(mL/kg) 1880 (31.2)    Urine (mL/kg/hr) 1025 (0.7)    Emesis/NG output 550 (0.4)    Total Output 1575     Net +305            PHYSICAL EXAMINATION: General: Elderly male, in NAD. Neuro: A&O x 3, non-focal.  HEENT: [place]/AT. PERRL, sclerae anicteric. Cardiovascular: IRIR, no M/R/G.  Lungs: Respirations even and unlabored.  Coarse BS with bilateral  wheezes.  On [place] Abdomen: BS absent, abd distended and hyperresonant. Musculoskeletal: No gross deformities, no edema.  Skin: Intact, warm, dry, no rashes.  LABS:  CBC  Recent Labs Lab 03/09/14 1533 03/10/14 0759 03/11/14 0240  WBC 8.8 7.7 6.9  HGB 9.1* 7.4* 7.2*  HCT 30.1* 24.8* 23.7*  PLT 343 305 281   Coag's  Recent Labs Lab 03/09/14 03/09/14 1533 03/11/14 0240  INR 7.0 4.87* 7.71*   BMET  Recent Labs Lab 03/09/14 1533 03/10/14 0759 03/11/14 0240  NA 140 136 140  K 4.2 5.1 4.8  CL 91* 86* 89*  CO2 37* 33* 38*  BUN 35* 37* 45*  CREATININE 2.27* 2.35* 2.19*  GLUCOSE 114* 121* 113*   Electrolytes  Recent Labs Lab 03/09/14 1533 03/10/14 0759 03/11/14 0240  CALCIUM 9.4 8.5 8.4  MG  --  1.9 2.1  PHOS  --  5.7* 5.0*   Sepsis Markers  Recent Labs Lab 03/09/14 1744  LATICACIDVEN 1.1   ABG  Recent Labs Lab 03/10/14 0123 03/10/14 0540 03/11/14 0040  PHART 7.302* 7.325* 7.331*  PCO2ART 78.1* 70.9* 69.5*  PO2ART 75.9* 94.4 122.0*   Liver Enzymes  Recent Labs Lab 03/09/14 1533  AST 23  ALT 12  ALKPHOS 96  BILITOT 1.0  ALBUMIN 3.3*  Cardiac Enzymes  Recent Labs Lab 03/09/14 1533 03/09/14 2239 03/10/14 1321  TROPONINI 0.04* 0.04* 0.03   Glucose No results for input(s): GLUCAP in the last 168 hours.  Imaging Ct Abdomen Pelvis Wo Contrast  03/09/2014   CLINICAL DATA:  Initial evaluation for abdominal distension and nausea. No bowel movement and 4 days. Recent perforated appendix.  EXAM: CT ABDOMEN AND PELVIS WITHOUT CONTRAST  TECHNIQUE: Multidetector CT imaging of the abdomen and pelvis was performed following the standard protocol without IV contrast.  COMPARISON:  Prior radiograph from earlier the same day as well as prior CT from 02/20/2014.  FINDINGS: Small layering bilateral pleural effusions with associated bibasilar opacity present, which may reflect atelectasis or consolidation. There is prominent cardiomegaly. Scattered  coronary artery and mitral valvular calcifications present. No pericardial effusion.  Limited noncontrast evaluation of the liver is within normal limits. Gallbladder are mildly distended without associated inflammatory changes. No biliary dilatation. Spleen, adrenal glands, and pancreas within normal limits.  Kidneys within normal limits without nephrolithiasis or hydronephrosis.  Stomach is markedly distended with air contrast level present 6 cm in diameter with scattered internal air-fluid levels. There is an apparent transition point within knee lower right abdomen (series 2, image 57). Findings compatible with acute small bowel obstruction. The small bowel is fluid filled distally but of normal caliber. The colon is decompressed. Moderate amount of retained stool within the ascending and proximal transverse colon. No free intraperitoneal air. Small volume ascites present adjacent to the liver.  Previously seen percutaneous pigtail drain has been removed from the right lower quadrant.  Bladder decompressed with a Foley catheter in place. Prostate normal.  Right inguinal lymph nodes measuring up to 1.3 cm in short axis noted, similar to prior, and may be reactive in nature. No other pathologically enlarged intra-abdominal pelvic lymph nodes identified. Present Moderate atheromatous plaque present within the intra-abdominal aorta and its branch vessels. No aneurysm.  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 No acute osseus abnormality. No worrisome lytic or blastic osseous lesions.  IMPRESSION: 1. Multiple prominent dilated loops of proximal and mid small bowel within the upper and mid abdomen, markedly increased in caliber relative to recent CT from 02/24/2014. There is a suspected transition point in the right lower quadrant, concerning for mechanical small bowel obstruction. No free intraperitoneal air. 2. Small volume free perihepatic fluid. 3. Small bilateral pleural  effusions with associated bibasilar atelectasis/consolidation. 4. Right inguinal adenopathy measuring up to 1.3 cm in short axis, similar to prior. These nodes are suspected to be reactive in nature. 5. Moderate cardiomegaly.   Electronically Signed   By: Jeannine Boga M.D.   On: 03/09/2014 21:16   Ct Head Wo Contrast  03/10/2014   CLINICAL DATA:  Encephalopathy  EXAM: CT HEAD WITHOUT CONTRAST  TECHNIQUE: Contiguous axial images were obtained from the base of the skull through the vertex without intravenous contrast.  COMPARISON:  10/03/2013  FINDINGS: Motion degraded images.  No evidence of parenchymal hemorrhage or extra-axial fluid collection. No mass lesion, mass effect, or midline shift.  No CT evidence of acute infarction.  Subcortical white matter and periventricular small vessel ischemic changes. Intracranial atherosclerosis.  Age related atrophy.  No ventriculomegaly.  The visualized paranasal sinuses are essentially clear. The mastoid air cells are unopacified.  No evidence of calvarial fracture.  IMPRESSION: Motion degraded images.  No evidence of acute intracranial abnormality.  Atrophy with small vessel ischemic changes and intracranial atherosclerosis.   Electronically Signed  By: Julian Hy M.D.   On: 03/10/2014 00:10   Dg Chest Port 1 View  03/11/2014   CLINICAL DATA:  Respiratory failure.  EXAM: PORTABLE CHEST - 1 VIEW  COMPARISON:  02/26/2014.  02/26/2014.  FINDINGS: NG tube noted with tip coiled over stomach. Cardiomegaly with pulmonary vascular prominence interstitial prominence suggesting congestive heart failure. Basilar atelectasis and/or infiltrates. Small right pleural effusion. No pneumothorax. Previously identified probable free air in the hemidiaphragm again noted. No acute osseus abnormality.  IMPRESSION: 1. Interim placement NG tube, its tip is in the stomach. 2. Previously described findings suggesting free intraperitoneal air again noted. 3. Congestive heart  failure with mild interstitial edema and small right pleural effusion. 4. Bibasilar atelectasis and/or mild infiltrates.   Electronically Signed   By: Marcello Moores  Register   On: 03/11/2014 07:53   Dg Abd Acute W/chest  03/09/2014   CLINICAL DATA:  Abdominal distension and abdominal pain for 4 weeks. History of ruptured bladder.  EXAM: ACUTE ABDOMEN SERIES (ABDOMEN 2 VIEW & CHEST 1 VIEW)  COMPARISON:  02/26/2014.  FINDINGS: Compared to prior, pulmonary aeration has improved although there is still elevation of both hemidiaphragms and low lung volumes with basilar atelectasis. Cardiopericardial silhouette remains enlarged. Monitoring leads project over the chest. No pulmonary edema or focal airspace consolidation identified.  On the upright abdominal images, there is gas that extends across the central diaphragms which may be within colon but is concerning for free air and decubitus radiographs are recommended. This gas-filled structure does not have the expected mural markings for small bowel, large bowel or stomach.  Large amount of stool is present along the ascending colon. The previously seen RIGHT lower quadrant drain has been removed. On the supine radiographs, there appears to be a dilated loop of small bowel extending across the central abdomen, measuring up to 5 cm. On the upright images, this has a fluid level. There is stool and bowel gas extending along the descending colon however because of technical degradation of the study. Rectal gas cannot be evaluated.  IMPRESSION: 1. Findings suspicious for free air in the abdomen. Decubitus radiographs recommended. Critical Value/emergent results were called by telephone at the time of interpretation on 03/09/2014 at 4:49 pm to Dr. Fredia Sorrow , who verbally acknowledged these results. 2. Low volume chest with cardiomegaly. 3. Dilated loop of small bowel measuring up to 5 cm suspicious for obstruction. 4. Interval removal of RIGHT lower quadrant percutaneous  strain.   Electronically Signed   By: Dereck Ligas M.D.   On: 03/09/2014 16:51   Dg Abd Decub  03/09/2014   CLINICAL DATA:  Suspected intraperitoneal free air, followup assess.  EXAM: ABDOMEN - 1 VIEW DECUBITUS  COMPARISON:  Abdominal radiographs March 09, 2014 at 1614 hr  FINDINGS: Limited assessment as RIGHT-sided imaged down. Large gaseous distended structure with air-fluid level in mid abdomen, in addition to a few bubbles of potentially intraperitoneal free air.  IMPRESSION: Suspected pneumoperitoneum on this limited RIGHT lateral decubitus examination. Large central abdomen air-fluid level, it is unclear if this reflects bowel/stomach or possible abscess. Recommend CT of the abdomen and pelvis with contrast.   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 Dc BiPAP - try to avoid with distended  belly Duonebs / Xopenex. Dc solumedrol  duonebs + pulmicort - hold outpatient symbicort, singulair, spiriva.   CARDIOVASCULAR A:  A.Fib with intermittent RVR - on coumadin Acute on chronic diastolic heart failure - echo from 04/15: EF 50-55%, mod LVH. Troponin Leak P:  Continue diltiazem gtt. dc coumadin , will use lovenox per pharmacy once INR <2. Strict I / O's. F/u on echo. Hold outpatient torsemide (pt appears dry).   RENAL A:   AKI P:   NS @ 75 -increase fluids if low BP or tachy BMP.  GASTROINTESTINAL NGT 12/29 >>> A:   SBO Protein cal malnutrition P:   NGT to LIS NPO.  HEMATOLOGIC A:   Anemia of chronic disease + iron deficiency Coagulopathy - INR supratherapeutic VTE Prophylaxis P:  Transfuse per usual ICU guidelines. SCD's. Vit K 5 mg x1   INFECTIOUS A:   AECOPD P:   BCx 12/28 >>> Abx: zosyn start date  12/29>  ENDOCRINE A:   No known issues P:   SSI if glucose consistently > 180.  NEUROLOGIC A:   Acute metabolic encephalopathy Anxiety P:   Monitor clinically. Hold outpatient xanax, remeron, trazodone.   Family updated: None available.  Interdisciplinary Family Meeting v Palliative Care Meeting:  Due by: 01/05.   Summary - ct conservative management, -NG to LIS , should ventilate better once distension decreases  The patient is critically ill with multiple organ systems failure and requires high complexity decision making for assessment and support, frequent evaluation and titration of therapies, application of advanced monitoring technologies and extensive interpretation of multiple databases. Critical Care Time devoted to patient care services described in this note independent of APP time is 35 minutes.   Rigoberto Noel MD  8:35 AM

## 2014-03-11 NOTE — Progress Notes (Signed)
Subjective: No abdominal pain, no sig flatus nor BM  Objective: Vital signs in last 24 hours: Temp:  [97.4 F (36.3 C)-98.3 F (36.8 C)] 97.4 F (36.3 C) (12/30 1204) Pulse Rate:  [59-110] 82 (12/30 0900) Resp:  [15-28] 18 (12/30 1300) BP: (98-129)/(47-70) 111/62 mmHg (12/30 1300) SpO2:  [90 %-100 %] 100 % (12/30 1200) Weight:  [132 lb 15 oz (60.3 kg)] 132 lb 15 oz (60.3 kg) (12/30 0600)       Intake/Output from previous day: 12/29 0701 - 12/30 0700 In: 2005 [I.V.:1675; NG/GT:180; IV Piggyback:150] Out: 0254 [Urine:1025; Emesis/NG output:550] Intake/Output this shift: Total I/O In: 860 [I.V.:780; NG/GT:30; IV Piggyback:50] Out: 530 [Urine:480; Emesis/NG output:50]  General appearance: alert and cooperative Resp: clear to auscultation bilaterally Cardio: S1, S2 normal GI: soft but still quite distended, remains nontender  Lab Results:   Recent Labs  03/10/14 0759 03/11/14 0240  WBC 7.7 6.9  HGB 7.4* 7.2*  HCT 24.8* 23.7*  PLT 305 281   BMET  Recent Labs  03/10/14 0759 03/11/14 0240  NA 136 140  K 5.1 4.8  CL 86* 89*  CO2 33* 38*  GLUCOSE 121* 113*  BUN 37* 45*  CREATININE 2.35* 2.19*  CALCIUM 8.5 8.4   PT/INR  Recent Labs  03/09/14 1533 03/11/14 0240  LABPROT 45.8* 65.6*  INR 4.87* 7.71*   ABG  Recent Labs  03/10/14 0540 03/11/14 0040  PHART 7.325* 7.331*  HCO3 35.9* 35.7*    Studies/Results: Ct Abdomen Pelvis Wo Contrast  03/09/2014   CLINICAL DATA:  Initial evaluation for abdominal distension and nausea. No bowel movement and 4 days. Recent perforated appendix.  EXAM: CT ABDOMEN AND PELVIS WITHOUT CONTRAST  TECHNIQUE: Multidetector CT imaging of the abdomen and pelvis was performed following the standard protocol without IV contrast.  COMPARISON:  Prior radiograph from earlier the same day as well as prior CT from 02/20/2014.  FINDINGS: Small layering bilateral pleural effusions with associated bibasilar opacity present, which may  reflect atelectasis or consolidation. There is prominent cardiomegaly. Scattered coronary artery and mitral valvular calcifications present. No pericardial effusion.  Limited noncontrast evaluation of the liver is within normal limits. Gallbladder are mildly distended without associated inflammatory changes. No biliary dilatation. Spleen, adrenal glands, and pancreas within normal limits.  Kidneys within normal limits without nephrolithiasis or hydronephrosis.  Stomach is markedly distended with air contrast level present 6 cm in diameter with scattered internal air-fluid levels. There is an apparent transition point within knee lower right abdomen (series 2, image 57). Findings compatible with acute small bowel obstruction. The small bowel is fluid filled distally but of normal caliber. The colon is decompressed. Moderate amount of retained stool within the ascending and proximal transverse colon. No free intraperitoneal air. Small volume ascites present adjacent to the liver.  Previously seen percutaneous pigtail drain has been removed from the right lower quadrant.  Bladder decompressed with a Foley catheter in place. Prostate normal.  Right inguinal lymph nodes measuring up to 1.3 cm in short axis noted, similar to prior, and may be reactive in nature. No other pathologically enlarged intra-abdominal pelvic lymph nodes identified. Present Moderate atheromatous plaque present within the intra-abdominal aorta and its branch vessels. No aneurysm.  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 No acute osseus abnormality. No worrisome lytic or blastic osseous lesions.  IMPRESSION: 1. Multiple prominent dilated loops of proximal and mid small bowel within the upper and mid abdomen, markedly increased in caliber relative to recent CT  from 02/24/2014. There is a suspected transition point in the right lower quadrant, concerning for mechanical small bowel obstruction. No free  intraperitoneal air. 2. Small volume free perihepatic fluid. 3. Small bilateral pleural effusions with associated bibasilar atelectasis/consolidation. 4. Right inguinal adenopathy measuring up to 1.3 cm in short axis, similar to prior. These nodes are suspected to be reactive in nature. 5. Moderate cardiomegaly.   Electronically Signed   By: Jeannine Boga M.D.   On: 03/09/2014 21:16   Ct Head Wo Contrast  03/10/2014   CLINICAL DATA:  Encephalopathy  EXAM: CT HEAD WITHOUT CONTRAST  TECHNIQUE: Contiguous axial images were obtained from the base of the skull through the vertex without intravenous contrast.  COMPARISON:  10/03/2013  FINDINGS: Motion degraded images.  No evidence of parenchymal hemorrhage or extra-axial fluid collection. No mass lesion, mass effect, or midline shift.  No CT evidence of acute infarction.  Subcortical white matter and periventricular small vessel ischemic changes. Intracranial atherosclerosis.  Age related atrophy.  No ventriculomegaly.  The visualized paranasal sinuses are essentially clear. The mastoid air cells are unopacified.  No evidence of calvarial fracture.  IMPRESSION: Motion degraded images.  No evidence of acute intracranial abnormality.  Atrophy with small vessel ischemic changes and intracranial atherosclerosis.   Electronically Signed   By: Julian Hy M.D.   On: 03/10/2014 00:10   Dg Chest Port 1 View  03/11/2014   CLINICAL DATA:  Respiratory failure.  EXAM: PORTABLE CHEST - 1 VIEW  COMPARISON:  02/26/2014.  02/26/2014.  FINDINGS: NG tube noted with tip coiled over stomach. Cardiomegaly with pulmonary vascular prominence interstitial prominence suggesting congestive heart failure. Basilar atelectasis and/or infiltrates. Small right pleural effusion. No pneumothorax. Previously identified probable free air in the hemidiaphragm again noted. No acute osseus abnormality.  IMPRESSION: 1. Interim placement NG tube, its tip is in the stomach. 2. Previously  described findings suggesting free intraperitoneal air again noted. 3. Congestive heart failure with mild interstitial edema and small right pleural effusion. 4. Bibasilar atelectasis and/or mild infiltrates.   Electronically Signed   By: Marcello Moores  Register   On: 03/11/2014 07:53   Dg Abd Acute W/chest  03/09/2014   CLINICAL DATA:  Abdominal distension and abdominal pain for 4 weeks. History of ruptured bladder.  EXAM: ACUTE ABDOMEN SERIES (ABDOMEN 2 VIEW & CHEST 1 VIEW)  COMPARISON:  02/26/2014.  FINDINGS: Compared to prior, pulmonary aeration has improved although there is still elevation of both hemidiaphragms and low lung volumes with basilar atelectasis. Cardiopericardial silhouette remains enlarged. Monitoring leads project over the chest. No pulmonary edema or focal airspace consolidation identified.  On the upright abdominal images, there is gas that extends across the central diaphragms which may be within colon but is concerning for free air and decubitus radiographs are recommended. This gas-filled structure does not have the expected mural markings for small bowel, large bowel or stomach.  Large amount of stool is present along the ascending colon. The previously seen RIGHT lower quadrant drain has been removed. On the supine radiographs, there appears to be a dilated loop of small bowel extending across the central abdomen, measuring up to 5 cm. On the upright images, this has a fluid level. There is stool and bowel gas extending along the descending colon however because of technical degradation of the study. Rectal gas cannot be evaluated.  IMPRESSION: 1. Findings suspicious for free air in the abdomen. Decubitus radiographs recommended. Critical Value/emergent results were called by telephone at the time of interpretation on  03/09/2014 at 4:49 pm to Dr. Fredia Sorrow , who verbally acknowledged these results. 2. Low volume chest with cardiomegaly. 3. Dilated loop of small bowel measuring up to 5  cm suspicious for obstruction. 4. Interval removal of RIGHT lower quadrant percutaneous strain.   Electronically Signed   By: Dereck Ligas M.D.   On: 03/09/2014 16:51   Dg Abd Decub  03/09/2014   CLINICAL DATA:  Suspected intraperitoneal free air, followup assess.  EXAM: ABDOMEN - 1 VIEW DECUBITUS  COMPARISON:  Abdominal radiographs March 09, 2014 at 1614 hr  FINDINGS: Limited assessment as RIGHT-sided imaged down. Large gaseous distended structure with air-fluid level in mid abdomen, in addition to a few bubbles of potentially intraperitoneal free air.  IMPRESSION: Suspected pneumoperitoneum on this limited RIGHT lateral decubitus examination. Large central abdomen air-fluid level, it is unclear if this reflects bowel/stomach or possible abscess. Recommend CT of the abdomen and pelvis with contrast.   Electronically Signed   By: Elon Alas   On: 03/09/2014 17:44    Anti-infectives: Anti-infectives    Start     Dose/Rate Route Frequency Ordered Stop   03/11/14 1800  levofloxacin (LEVAQUIN) IVPB 750 mg  Status:  Discontinued     750 mg100 mL/hr over 90 Minutes Intravenous Every 48 hours 03/10/14 0543 03/10/14 0901   03/10/14 0900  piperacillin-tazobactam (ZOSYN) IVPB 3.375 g     3.375 g12.5 mL/hr over 240 Minutes Intravenous Every 8 hours 03/10/14 0847     03/09/14 2315  levofloxacin (LEVAQUIN) IVPB 500 mg  Status:  Discontinued     500 mg100 mL/hr over 60 Minutes Intravenous Every 24 hours 03/09/14 2303 03/10/14 0543      Assessment/Plan: SBO due to recent perf appy TX by perc drain - no residual abscess, continue NGT. AKI a bit better. Hope to avoid surgery due to high risk of bowel complication and his frailty. I D/W Dr. Elsworth Soho at the bedside  LOS: 2 days    Chandy Tarman E 03/11/2014

## 2014-03-12 DIAGNOSIS — I4891 Unspecified atrial fibrillation: Secondary | ICD-10-CM

## 2014-03-12 DIAGNOSIS — I5030 Unspecified diastolic (congestive) heart failure: Secondary | ICD-10-CM

## 2014-03-12 DIAGNOSIS — Z48815 Encounter for surgical aftercare following surgery on the digestive system: Secondary | ICD-10-CM

## 2014-03-12 DIAGNOSIS — J449 Chronic obstructive pulmonary disease, unspecified: Secondary | ICD-10-CM

## 2014-03-12 LAB — BASIC METABOLIC PANEL
Anion gap: 7 (ref 5–15)
BUN: 31 mg/dL — ABNORMAL HIGH (ref 6–23)
CO2: 35 mmol/L — AB (ref 19–32)
Calcium: 8.2 mg/dL — ABNORMAL LOW (ref 8.4–10.5)
Chloride: 101 mEq/L (ref 96–112)
Creatinine, Ser: 1.39 mg/dL — ABNORMAL HIGH (ref 0.50–1.35)
GFR calc Af Amer: 56 mL/min — ABNORMAL LOW (ref >90)
GFR calc non Af Amer: 48 mL/min — ABNORMAL LOW (ref >90)
GLUCOSE: 82 mg/dL (ref 70–99)
POTASSIUM: 4.1 mmol/L (ref 3.5–5.1)
Sodium: 143 mmol/L (ref 135–145)

## 2014-03-12 LAB — CBC
HEMATOCRIT: 23.5 % — AB (ref 39.0–52.0)
Hemoglobin: 7.1 g/dL — ABNORMAL LOW (ref 13.0–17.0)
MCH: 27.3 pg (ref 26.0–34.0)
MCHC: 30.2 g/dL (ref 30.0–36.0)
MCV: 90.4 fL (ref 78.0–100.0)
PLATELETS: 283 10*3/uL (ref 150–400)
RBC: 2.6 MIL/uL — AB (ref 4.22–5.81)
RDW: 18.5 % — ABNORMAL HIGH (ref 11.5–15.5)
WBC: 8.6 10*3/uL (ref 4.0–10.5)

## 2014-03-12 LAB — PROTIME-INR
INR: 1.85 — ABNORMAL HIGH (ref 0.00–1.49)
PROTHROMBIN TIME: 21.5 s — AB (ref 11.6–15.2)

## 2014-03-12 LAB — MAGNESIUM: Magnesium: 2.1 mg/dL (ref 1.5–2.5)

## 2014-03-12 LAB — PHOSPHORUS: Phosphorus: 2.1 mg/dL — ABNORMAL LOW (ref 2.3–4.6)

## 2014-03-12 MED ORDER — HEPARIN (PORCINE) IN NACL 100-0.45 UNIT/ML-% IJ SOLN
900.0000 [IU]/h | INTRAMUSCULAR | Status: DC
  Filled 2014-03-12: qty 250

## 2014-03-12 MED ORDER — CETYLPYRIDINIUM CHLORIDE 0.05 % MT LIQD
7.0000 mL | Freq: Two times a day (BID) | OROMUCOSAL | Status: DC
  Administered 2014-03-12 – 2014-03-17 (×10): 7 mL via OROMUCOSAL

## 2014-03-12 NOTE — Progress Notes (Signed)
PULMONARY / CRITICAL CARE MEDICINE   Name: Theodore [name] MRN: [id number] DOB: February 26, 1941    ADMISSION DATE:  03/09/2014 CONSULTATION DATE:  03/12/2014  REFERRING MD :  EDP  CHIEF COMPLAINT:  AMS  INITIAL PRESENTATION:  73 y.o. M brought to [facility] ED 12/28 for SOB and AMS.  Had recent admission 12/1 - 12/17 for appendiceal abscess.  In ED, found to have SBO, AKI, acute encephalopathy.  Pt transferred to [facility] for further care, PCCM consulted..     STUDIES:  Abd 12/28 >>> findings suspicious for free air, dilated loop of SB suspicious for SBO. CT abd / pelv 12/28 >>> concerning for SBO, no free air, small volume perihepatic fluid, small bilateral pleural effusions, right inguinal adenopathy suspected to be reactive, mild cardiomegaly. CT head 12/29 >>> no acute process Echo 12/29 >>>  SIGNIFICANT EVENTS: 12/28 - admitted with SBO appy drain  12/1- 12/ 13  SUBJECTIVE: denies abd pain Afebrile Good UO, NG about 50cc  VITAL SIGNS: Temp:  [97.3 F (36.3 C)-97.9 F (36.6 C)] 97.9 F (36.6 C) (12/31 0731) Pulse Rate:  [53-115] 68 (12/31 0600) Resp:  [16-23] 22 (12/31 0600) BP: (110-130)/(55-67) 130/62 mmHg (12/31 0600) SpO2:  [98 %-100 %] 98 % (12/31 0731) HEMODYNAMICS:   VENTILATOR SETTINGS:   INTAKE / OUTPUT: Intake/Output      12/30 0701 - 12/31 0700 12/31 0701 - 01/01 0700   I.V. (mL/kg) 3120 (51.7)    NG/GT 60    IV Piggyback 200    Total Intake(mL/kg) 3380 (56.1)    Urine (mL/kg/hr) 1675 (1.2)    Emesis/NG output 50 (0)    Total Output 1725     Net +1655          Stool Occurrence 1 x      PHYSICAL EXAMINATION: General: Elderly male, in NAD. Neuro: A&O x 3, non-focal.  HEENT: Superior/AT. PERRL, sclerae anicteric. Cardiovascular: IRIR, no M/R/G.  Lungs: Respirations even and unlabored.  Coarse BS without wheezes.  On [place] Abdomen: BS hypoactive, abd less distended and hyperresonant. Musculoskeletal: No gross deformities, no edema.  Skin: Intact, warm, dry, no  rashes.  LABS:  CBC  Recent Labs Lab 03/10/14 0759 03/11/14 0240 03/12/14 0304  WBC 7.7 6.9 8.6  HGB 7.4* 7.2* 7.1*  HCT 24.8* 23.7* 23.5*  PLT 305 281 283   Coag's  Recent Labs Lab 03/09/14 1533 03/11/14 0240 03/12/14 0304  INR 4.87* 7.71* 1.85*   BMET  Recent Labs Lab 03/10/14 0759 03/11/14 0240 03/12/14 0304  NA 136 140 143  K 5.1 4.8 4.1  CL 86* 89* 101  CO2 33* 38* 35*  BUN 37* 45* 31*  CREATININE 2.35* 2.19* 1.39*  GLUCOSE 121* 113* 82   Electrolytes  Recent Labs Lab 03/10/14 0759 03/11/14 0240 03/12/14 0304  CALCIUM 8.5 8.4 8.2*  MG 1.9 2.1 2.1  PHOS 5.7* 5.0* 2.1*   Sepsis Markers  Recent Labs Lab 03/09/14 1744  LATICACIDVEN 1.1   ABG  Recent Labs Lab 03/10/14 0123 03/10/14 0540 03/11/14 0040  PHART 7.302* 7.325* 7.331*  PCO2ART 78.1* 70.9* 69.5*  PO2ART 75.9* 94.4 122.0*   Liver Enzymes  Recent Labs Lab 03/09/14 1533  AST 23  ALT 12  ALKPHOS 96  BILITOT 1.0  ALBUMIN 3.3*   Cardiac Enzymes  Recent Labs Lab 03/09/14 1533 03/09/14 2239 03/10/14 1321  TROPONINI 0.04* 0.04* 0.03   Glucose No results for input(s): GLUCAP in the last 168 hours.  Imaging Dg Chest [facility] 1 [address]  03/11/2014   CLINICAL DATA:  Respiratory failure.  EXAM: PORTABLE CHEST - 1 VIEW  COMPARISON:  02/26/2014.  02/26/2014.  FINDINGS: NG tube noted with tip coiled over stomach. Cardiomegaly with pulmonary vascular prominence interstitial prominence suggesting congestive heart failure. Basilar atelectasis and/or infiltrates. Small right pleural effusion. No pneumothorax. Previously identified probable free air in the hemidiaphragm again noted. No acute osseus abnormality.  IMPRESSION: 1. Interim placement NG tube, its tip is in the stomach. 2. Previously described findings suggesting free intraperitoneal air again noted. 3. Congestive heart failure with mild interstitial edema and small right pleural effusion. 4. Bibasilar atelectasis and/or mild  infiltrates.   Electronically Signed   By: Marcello Moores  Register   On: 03/11/2014 07:53    ASSESSMENT / PLAN:  PULMONARY A: Acute on chronic hypoxemic and hypercarbic respiratory failure AECOPD Pulmonary Hypertension - PAP 74  P:   Continue supplemental O2 as needed to maintain Spo2 > 92%. Dc BiPAP - try to avoid with distended belly Duonebs / Xopenex. Dc solumedrol  duonebs + pulmicort - hold outpatient symbicort, singulair, spiriva.   CARDIOVASCULAR A:  A.Fib with intermittent RVR - on coumadin Acute on chronic diastolic heart failure - echo from 04/15: EF 50-55%, mod LVH. Troponin Leak P:  Continue diltiazem gtt. dc coumadin , may need  lovenox per pharmacy once renal fn normalised, concern for guiaic + Strict I / O's. Hold outpatient torsemide (pt appears dry).   RENAL A:   AKI, resolving P:   NS @ 75 -increase fluids if low BP or tachy BMPdaily  GASTROINTESTINAL NGT 12/29 >>> A:   SBO Protein cal malnutrition P:   NGT to LIS Advance to clears  HEMATOLOGIC A:   Anemia of chronic disease + iron deficiency Coagulopathy - INR supratherapeutic, now down with vit K x 1  P:  Transfuse per usual ICU guidelines. SCD's.   INFECTIOUS A:   AECOPD P:   BCx 12/28 >>>ng Abx: zosyn start date 12/29> Plan for 5-7 days  ENDOCRINE A:   No known issues P:   SSI if glucose consistently > 180.  NEUROLOGIC A:   Acute metabolic encephalopathy Anxiety P:   Monitor clinically. Hold outpatient xanax, remeron, trazodone-resume soon   Family updated: None available.  Interdisciplinary Family Meeting v Palliative Care Meeting:  Due by: 01/05.   Summary - ct conservative management, advance PO, OK to transfer to SDU & to [facility]  The patient is critically ill with multiple organ systems failure and requires high complexity decision making for assessment and support, frequent evaluation and titration of therapies, application of advanced monitoring technologies and  extensive interpretation of multiple databases. Critical Care Time devoted to patient care services described in this note independent of APP time is 31 minutes.   Rigoberto Noel MD  9:50 AM

## 2014-03-12 NOTE — Progress Notes (Addendum)
Addendum (1015): Holding on heparin bridge as pt with FOB positive, Hgb 7.1. Will f/u for further plans.  Sherlon Handing, PharmD, BCPS Clinical pharmacist, pager [phone] 03/12/2014 10:11 AM   [place] for Heparin Indication: atrial fibrillation   Allergies  Allergen Reactions  . Aspirin Other (See Comments)    On coumadin    Patient Measurements: Height: 5\' 6"  (167.6 cm) Weight: 132 lb 15 oz (60.3 kg) IBW/kg (Calculated) : 63.8  Heparin weight: 60 kg  Vital Signs: Temp: 97.9 F (36.6 C) (12/31 0731) Temp Source: Oral (12/31 0731) BP: 130/62 mmHg (12/31 0600) Pulse Rate: 68 (12/31 0600)  Labs:  Recent Labs  03/09/14 1533 03/09/14 2239 03/10/14 0759 03/10/14 1321 03/11/14 0240 03/12/14 0304  HGB 9.1*  --  7.4*  --  7.2* 7.1*  HCT 30.1*  --  24.8*  --  23.7* 23.5*  PLT 343  --  305  --  281 283  LABPROT 45.8*  --   --   --  65.6* 21.5*  INR 4.87*  --   --   --  7.71* 1.85*  CREATININE 2.27*  --  2.35*  --  2.19* 1.39*  TROPONINI 0.04* 0.04*  --  0.03  --   --     Estimated Creatinine Clearance: 39.8 mL/min (by C-G formula based on Cr of 1.39).  Assessment: 73 y.o. male admitted with AMS, SOB. CT abd concerning for SBO. Pt on Coumadin PTA for afib (admit INR 4.87; home dose 4mg  daily except for 6mg  on Mon/Thur - last dose 12/27) . Holding coumadin as surgery may be needed, vit K 5mg  IV x 1 given 12/30 as INR up to 7.7. INR now down to 1.85. Hgb 7.1. (baseline 9.1) - will watch closely. Plt ok. Discussed with Dr. Elsworth Soho who gave verbal orders for heparin bridge per pharmacy.  Goal of Therapy:  INR 2-3 Monitor platelets by anticoagulation protocol: Yes   Plan:  - Heparin gtt at 900 units/hr. No bolus. - Will check 8 hr heparin level - Daily heparin level and CBC - F/u surgery plans and restart of coumadin if no surgery to be done  Sherlon Handing, PharmD, BCPS Clinical pharmacist, pager [phone] 03/12/2014,9:52  AM   Addendum [phone]): Holding on heparin bridge as pt with FOB positive, Hgb 7.1. Will f/u for further plans.  Sherlon Handing, PharmD, BCPS Clinical pharmacist, pager [phone] 03/12/2014 10:11 AM

## 2014-03-12 NOTE — Progress Notes (Signed)
Patient ID: Theodore [name], male   DOB: August 16, 1940, 73 y.o.   MRN: [id number]    Subjective: + flatus and BM, NGT output is way down.    Objective: Vital signs in last 24 hours: Temp:  [97.3 F (36.3 C)-97.9 F (36.6 C)] 97.9 F (36.6 C) (12/31 0731) Pulse Rate:  [53-115] 68 (12/31 0600) Resp:  [16-23] 22 (12/31 0600) BP: (105-130)/(47-67) 130/62 mmHg (12/31 0600) SpO2:  [98 %-100 %] 98 % (12/31 0731)       Intake/Output from previous day: 12/30 0701 - 12/31 0700 In: 3380 [I.V.:3120; NG/GT:60; IV Piggyback:200] Out: 1725 [Urine:1675; Emesis/NG output:50] Intake/Output this shift:    General appearance: alert and cooperative Resp: clear to auscultation bilaterally Cardio: S1, S2 normal GI: soft, minimally distended, non tender  Lab Results:   Recent Labs  03/11/14 0240 03/12/14 0304  WBC 6.9 8.6  HGB 7.2* 7.1*  HCT 23.7* 23.5*  PLT 281 283   BMET  Recent Labs  03/11/14 0240 03/12/14 0304  NA 140 143  K 4.8 4.1  CL 89* 101  CO2 38* 35*  GLUCOSE 113* 82  BUN 45* 31*  CREATININE 2.19* 1.39*  CALCIUM 8.4 8.2*   PT/INR  Recent Labs  03/11/14 0240 03/12/14 0304  LABPROT 65.6* 21.5*  INR 7.71* 1.85*   ABG  Recent Labs  03/10/14 0540 03/11/14 0040  PHART 7.325* 7.331*  HCO3 35.9* 35.7*    Studies/Results: Dg Chest Port 1 View  03/11/2014   CLINICAL DATA:  Respiratory failure.  EXAM: PORTABLE CHEST - 1 VIEW  COMPARISON:  02/26/2014.  02/26/2014.  FINDINGS: NG tube noted with tip coiled over stomach. Cardiomegaly with pulmonary vascular prominence interstitial prominence suggesting congestive heart failure. Basilar atelectasis and/or infiltrates. Small right pleural effusion. No pneumothorax. Previously identified probable free air in the hemidiaphragm again noted. No acute osseus abnormality.  IMPRESSION: 1. Interim placement NG tube, its tip is in the stomach. 2. Previously described findings suggesting free intraperitoneal air again noted. 3.  Congestive heart failure with mild interstitial edema and small right pleural effusion. 4. Bibasilar atelectasis and/or mild infiltrates.   Electronically Signed   By: Marcello Moores  Register   On: 03/11/2014 07:53    Anti-infectives: Anti-infectives    Start     Dose/Rate Route Frequency Ordered Stop   03/11/14 1800  levofloxacin (LEVAQUIN) IVPB 750 mg  Status:  Discontinued     750 mg100 mL/hr over 90 Minutes Intravenous Every 48 hours 03/10/14 0543 03/10/14 0901   03/10/14 0900  piperacillin-tazobactam (ZOSYN) IVPB 3.375 g     3.375 g12.5 mL/hr over 240 Minutes Intravenous Every 8 hours 03/10/14 0847     03/09/14 2315  levofloxacin (LEVAQUIN) IVPB 500 mg  Status:  Discontinued     500 mg100 mL/hr over 60 Minutes Intravenous Every 24 hours 03/09/14 2303 03/10/14 0543      Assessment/Plan: SBO due to recent perf appy TX by perc drain - no residual abscess. AKI a bit better. Hope to avoid surgery due to high risk of bowel complication and his frailty.  D/c NGT, clear liquids now, full liquids for evening meal if tolerates.    LOS: 3 days    [facility] 03/12/2014

## 2014-03-13 DIAGNOSIS — I482 Chronic atrial fibrillation, unspecified: Secondary | ICD-10-CM | POA: Insufficient documentation

## 2014-03-13 DIAGNOSIS — G9341 Metabolic encephalopathy: Secondary | ICD-10-CM

## 2014-03-13 DIAGNOSIS — J9621 Acute and chronic respiratory failure with hypoxia: Secondary | ICD-10-CM

## 2014-03-13 DIAGNOSIS — I272 Pulmonary hypertension, unspecified: Secondary | ICD-10-CM | POA: Insufficient documentation

## 2014-03-13 DIAGNOSIS — D62 Acute posthemorrhagic anemia: Secondary | ICD-10-CM

## 2014-03-13 DIAGNOSIS — F411 Generalized anxiety disorder: Secondary | ICD-10-CM

## 2014-03-13 DIAGNOSIS — N179 Acute kidney failure, unspecified: Secondary | ICD-10-CM | POA: Insufficient documentation

## 2014-03-13 DIAGNOSIS — R7989 Other specified abnormal findings of blood chemistry: Secondary | ICD-10-CM

## 2014-03-13 DIAGNOSIS — R140 Abdominal distension (gaseous): Secondary | ICD-10-CM | POA: Insufficient documentation

## 2014-03-13 DIAGNOSIS — I270 Primary pulmonary hypertension: Secondary | ICD-10-CM

## 2014-03-13 DIAGNOSIS — I5033 Acute on chronic diastolic (congestive) heart failure: Secondary | ICD-10-CM | POA: Insufficient documentation

## 2014-03-13 DIAGNOSIS — R778 Other specified abnormalities of plasma proteins: Secondary | ICD-10-CM | POA: Insufficient documentation

## 2014-03-13 DIAGNOSIS — D638 Anemia in other chronic diseases classified elsewhere: Secondary | ICD-10-CM

## 2014-03-13 DIAGNOSIS — D689 Coagulation defect, unspecified: Secondary | ICD-10-CM | POA: Insufficient documentation

## 2014-03-13 DIAGNOSIS — R791 Abnormal coagulation profile: Secondary | ICD-10-CM

## 2014-03-13 LAB — PREPARE RBC (CROSSMATCH)

## 2014-03-13 LAB — BASIC METABOLIC PANEL
ANION GAP: 9 (ref 5–15)
BUN: 19 mg/dL (ref 6–23)
CHLORIDE: 103 meq/L (ref 96–112)
CO2: 30 mmol/L (ref 19–32)
Calcium: 8.2 mg/dL — ABNORMAL LOW (ref 8.4–10.5)
Creatinine, Ser: 0.99 mg/dL (ref 0.50–1.35)
GFR calc Af Amer: >90 mL/min (ref >90)
GFR calc non Af Amer: 79 mL/min — ABNORMAL LOW (ref >90)
Glucose, Bld: 94 mg/dL (ref 70–99)
Potassium: 3.6 mmol/L (ref 3.5–5.1)
Sodium: 142 mmol/L (ref 135–145)

## 2014-03-13 LAB — CBC
HCT: 25.1 % — ABNORMAL LOW (ref 39.0–52.0)
Hemoglobin: 7.5 g/dL — ABNORMAL LOW (ref 13.0–17.0)
MCH: 27.4 pg (ref 26.0–34.0)
MCHC: 29.9 g/dL — ABNORMAL LOW (ref 30.0–36.0)
MCV: 91.6 fL (ref 78.0–100.0)
Platelets: 301 10*3/uL (ref 150–400)
RBC: 2.74 MIL/uL — ABNORMAL LOW (ref 4.22–5.81)
RDW: 18.7 % — ABNORMAL HIGH (ref 11.5–15.5)
WBC: 6.9 10*3/uL (ref 4.0–10.5)

## 2014-03-13 LAB — PROTIME-INR
INR: 1.7 — ABNORMAL HIGH (ref 0.00–1.49)
Prothrombin Time: 20.1 seconds — ABNORMAL HIGH (ref 11.6–15.2)

## 2014-03-13 LAB — PHOSPHORUS: PHOSPHORUS: 1.4 mg/dL — AB (ref 2.3–4.6)

## 2014-03-13 LAB — MAGNESIUM: MAGNESIUM: 1.8 mg/dL (ref 1.5–2.5)

## 2014-03-13 MED ORDER — ENOXAPARIN SODIUM 80 MG/0.8ML ~~LOC~~ SOLN
70.0000 mg | Freq: Two times a day (BID) | SUBCUTANEOUS | Status: DC
  Administered 2014-03-13 – 2014-03-18 (×10): 70 mg via SUBCUTANEOUS
  Filled 2014-03-13 (×13): qty 0.8

## 2014-03-13 MED ORDER — BISACODYL 10 MG RE SUPP: 10.0000 mg | Freq: Every day | RECTAL | Status: DC | PRN

## 2014-03-13 MED ORDER — SENNOSIDES-DOCUSATE SODIUM 8.6-50 MG PO TABS
1.0000 | ORAL_TABLET | Freq: Every day | ORAL | Status: DC
  Administered 2014-03-13 – 2014-03-16 (×4): 1 via ORAL
  Filled 2014-03-13 (×4): qty 1

## 2014-03-13 MED ORDER — SODIUM CHLORIDE 0.9 % IV SOLN: Freq: Once | INTRAVENOUS | Status: DC

## 2014-03-13 MED ORDER — DIPHENHYDRAMINE HCL 25 MG PO CAPS
25.0000 mg | ORAL_CAPSULE | Freq: Once | ORAL | Status: AC
  Administered 2014-03-13: 25 mg via ORAL
  Filled 2014-03-13: qty 1

## 2014-03-13 NOTE — Progress Notes (Signed)
[facility] TEAM 1 - Stepdown/ICU TEAM Progress Note  Theodore [name] BMW:[id number] DOB: [birth year] DOA: 03/09/2014 PCP: Tula Nakayama, MD  Admit HPI / Brief Narrative: Theodore [name] is a 74 y.o. BM PMHx anxiety, atrial fibrillation, chronic diastolic CHF, pulmonary hypertension, asthma, COPD, lung nodules, HTN, . He had recent admission to [facility] 12/1 - 12/7 for appendiceal abscess and had a drain placed by IR (removed 12/13). He was discharged home with home health RN and PT. He was seen by PCP just one day prior to presentation and was felt to be gradually improving. On 12/28, he complained of SOB and was sent to ED. In ED, CT abd / pelv revealed findings concerning for SBO. In addition, he had acute encephalopathy, AKI, weakness, intermittent AF with RVR, and mildly elevated troponin. He was transferred to [facility] for further management.  HPI/Subjective: 1/1 A/O 4, states on chronic home O2 at 2 L via [place], states continues to smoke, negative N/V, negative CP, negative abdominal pain. Request that his Foley catheter be removed.    Assessment/Plan: Sepsis/SBO/appendiceal abscess  -[facility] 12/1 - 12/7 for appendiceal abscess and had a drain placed by IR (removed 12/13). -Resolving, patient now on full liquid diet -Continue Zosyn minimum 7-10 days?; discuss with ID in a.m. -Continue normal saline at 75 ml/hr  Acute on chronic hypoxemic and hypercarbic respiratory failure -Multifactorial acute exacerbation of COPD, sepsis secondary to appendiceal abscess, and small bowel obstruction. -Resolved patient on home O2 regimen of 2 L O2 via [place] -Continue DuoNeb QID -Continue Pulmicort nebs BID -Flutter valve q 4hr  -Hold outpatient symbicort, singulair, spiriva.  A.Fib chronic with intermittent RVR - coumadin stopped secondary to GI bleed (supratherapeutic INR) -INR < 2 ; start Lovenox per pharmacy -Continue Cardizem drip for rate control, fairly well controlled will consider transitioning to  by mouth in the a.m.  Acute on chronic diastolic heart failure - echo from 04/15: EF 50-55%, mod LVH. -See A. fib with RVR -1/1 Transfuse one unit PRBC to maintain hemoglobin>8 -Hold outpatient torsemide (pt appears dry). -Strict I&O -Daily a.m. Weight -Echocardiogram pending  Pulmonary Hypertension - PAP 74 from echo 04/15 -If BP improves would benefit from afterload reducing agent  Elevated Troponin/ Leak -Resolved -Consult cardiology in the a.m.  Acute renal failure  -Resolved, continue to monitor   Acute blood loss anemia/Anemia of chronic disease + iron deficiency -Supratherapeutic INR resolved -Start Lovenox per pharmacy for A. fib  Coagulopathy - INR supratherapeutic -Resolved  -See acute blood loss anemia  Acute metabolic encephalopathy -Multifactorial see above -Resolved  Anxiety -Monitor clinically. -Hold outpatient xanax, remeron, trazodone.    Code Status: FULL Family Communication: no family present at time of exam Disposition Plan: Per surgery    Consultants: Dr.Faera Byerly (surgery) Dr. Kara Mead [facility] M)   Procedure/Significant Events: 1/1 transfuse one unit PRBC    Culture 12/28 blood     Antibiotics: Levaquin, start date 12/28, day 1/x. Zosyn 12/29>>  DVT prophylaxis: SCD/Lovenox   Devices    LINES / TUBES:      Continuous Infusions: . sodium chloride Stopped (03/10/14 1900)  . sodium chloride 125 mL/hr at 03/13/14 0950  . diltiazem (CARDIZEM) infusion 5 mg/hr (03/13/14 0600)    Objective: VITAL SIGNS: Temp: 98.5 F (36.9 C) (01/01 1229) Temp Source: Oral (01/01 1229) BP: 116/61 mmHg (01/01 1229) Pulse Rate: 88 (01/01 1229) SPO2; FIO2:   Intake/Output Summary (Last 24 hours) at 03/13/14 1436 Last data filed at 03/13/14 0929  Gross per  24 hour  Intake   2833 ml  Output   1175 ml  Net   1658 ml     Exam: General: A/O 4, NAD, No acute respiratory distress Lungs: Poor air movement in all lung  fields, bibasilar crackles, negative wheezing Cardiovascular: Regular rate and rhythm without murmur gallop or rub normal S1 and S2 Abdomen: Nontender, distended, soft, bowel sounds positive, no rebound, no ascites, no appreciable mass Extremities: No significant cyanosis, clubbing, or edema bilateral lower extremities  Data Reviewed: Basic Metabolic Panel:  Recent Labs Lab 03/09/14 1533 03/10/14 0759 03/11/14 0240 03/12/14 0304 03/13/14 0259  NA 140 136 140 143 142  K 4.2 5.1 4.8 4.1 3.6  CL 91* 86* 89* 101 103  CO2 37* 33* 38* 35* 30  GLUCOSE 114* 121* 113* 82 94  BUN 35* 37* 45* 31* 19  CREATININE 2.27* 2.35* 2.19* 1.39* 0.99  CALCIUM 9.4 8.5 8.4 8.2* 8.2*  MG  --  1.9 2.1 2.1 1.8  PHOS  --  5.7* 5.0* 2.1* 1.4*   Liver Function Tests:  Recent Labs Lab 03/09/14 1533  AST 23  ALT 12  ALKPHOS 96  BILITOT 1.0  PROT 8.3  ALBUMIN 3.3*    Recent Labs Lab 03/09/14 1533  LIPASE 15    Recent Labs Lab 03/09/14 2309  AMMONIA 49*   CBC:  Recent Labs Lab 03/09/14 1533 03/10/14 0759 03/11/14 0240 03/12/14 0304 03/13/14 0259  WBC 8.8 7.7 6.9 8.6 6.9  NEUTROABS 7.0  --   --   --   --   HGB 9.1* 7.4* 7.2* 7.1* 7.5*  HCT 30.1* 24.8* 23.7* 23.5* 25.1*  MCV 92.0 90.2 88.8 90.4 91.6  PLT 343 305 281 283 301   Cardiac Enzymes:  Recent Labs Lab 03/09/14 1533 03/09/14 2239 03/10/14 1321  TROPONINI 0.04* 0.04* 0.03   BNP (last 3 results)  Recent Labs  06/16/13 1554 06/17/13 0537 07/09/13 0555  PROBNP 3560.0* 3222.0* 3842.0*   CBG: No results for input(s): GLUCAP in the last 168 hours.  Recent Results (from the past 240 hour(s))  Culture, blood (routine x 2)     Status: None (Preliminary result)   Collection Time: 03/09/14  3:33 PM  Result Value Ref Range Status   Specimen Description BLOOD RIGHT ARM  Final   Special Requests BOTTLES DRAWN AEROBIC AND ANAEROBIC 6CC BOTTLES  Final   Culture NO GROWTH 3 DAYS  Final   Report Status PENDING   Incomplete  Culture, blood (routine x 2)     Status: None (Preliminary result)   Collection Time: 03/09/14  5:44 PM  Result Value Ref Range Status   Specimen Description BLOOD RIGHT ARM  Final   Special Requests BOTTLES DRAWN AEROBIC AND ANAEROBIC 10CC BOTTLES  Final   Culture NO GROWTH 3 DAYS  Final   Report Status PENDING  Incomplete  MRSA PCR Screening     Status: None   Collection Time: 03/10/14  5:47 AM  Result Value Ref Range Status   MRSA by PCR NEGATIVE NEGATIVE Final    Comment:        The GeneXpert MRSA Assay (FDA approved for NASAL specimens only), is one component of a comprehensive MRSA colonization surveillance program. It is not intended to diagnose MRSA infection nor to guide or monitor treatment for MRSA infections.      Studies:  Recent x-ray studies have been reviewed in detail by the Attending Physician  Scheduled Meds:  Scheduled Meds: . antiseptic oral rinse  7 mL Mouth Rinse BID  . budesonide (PULMICORT) nebulizer solution  0.5 mg Nebulization BID  . ipratropium-albuterol  3 mL Nebulization Q6H  . piperacillin-tazobactam (ZOSYN)  IV  3.375 g Intravenous Q8H  . senna-docusate  1 tablet Oral QHS  . sodium chloride  3 mL Intravenous Q12H    Time spent on care of this patient: 40 mins   Allie Bossier , MD   [facility] Office  [phone] Pager [phone]  On-Call/Text Page:      [email or web address]      password [email or web address]  If 7PM-7AM, please contact night-coverage [email or web address] Password [email or web address] 03/13/2014, 2:36 PM   LOS: 4 days

## 2014-03-13 NOTE — Progress Notes (Signed)
Called into patient's. NG tube inadvertantly pulled per Patient. NP notified no orders given. Patient in no acute  Distress. Will continue to monitor Pt closely.

## 2014-03-13 NOTE — Progress Notes (Signed)
ANTICOAGULATION CONSULT NOTE - Initial Consult  Pharmacy Consult for Lovenox Indication: atrial fibrillation  Allergies  Allergen Reactions  . Aspirin Other (See Comments)    On coumadin    Patient Measurements: Height: 5\' 6"  (167.6 cm) Weight: 158 lb 1.1 oz (71.7 kg) IBW/kg (Calculated) : 63.8 Heparin Dosing Weight:   Vital Signs: Temp: 98.9 F (37.2 C) (01/01 1601) Temp Source: Oral (01/01 1601) BP: 127/74 mmHg (01/01 1600) Pulse Rate: 88 (01/01 1229)  Labs:  Recent Labs  03/11/14 0240 03/12/14 0304 03/13/14 0259  HGB 7.2* 7.1* 7.5*  HCT 23.7* 23.5* 25.1*  PLT 281 283 301  LABPROT 65.6* 21.5* 20.1*  INR 7.71* 1.85* 1.70*  CREATININE 2.19* 1.39* 0.99    Estimated Creatinine Clearance: 59.1 mL/min (by C-G formula based on Cr of 0.99).   Medical History: Past Medical History  Diagnosis Date  . Glaucoma     uses eye drops daily  . Allergic rhinitis     takes Singulair at bedtime  . Atrial fibrillation     takes Coumadin daily  . Diastolic heart failure     LVEF 63-01%, rate 2 diastolic dysfunction 08/107  . Pulmonary hypertension     70 mmHg 03/2012  . Asthma     Albuterol neb and inhaler as needed  . Anxiety     takes Xanax daily as needed  . COPD (chronic obstructive pulmonary disease)     Symbicort daily  . Essential hypertension, benign     takes Metoprolol and Diltiazem daily  . Anemia     takes ferrous sulfate daily  . History of bronchitis   . Shortness of breath     with exertion  . Numbness     fingertips on both hands  . Lung nodules   . History of blood transfusion     Medications:  See EMR  Assessment: 74 yo male on warfarin PTA for AFib. Pt admitted with supratherapeutic INR up to 7.7, pt rec'd Vitamin K on 12/30, INR now down to 1.7. Warfarin was held due to potential need for surgery. Pt also had FOBT (+) on 12/30 with concomitant low hemoglobin 9.1 > 7.5, plts remain stable and wnl, pt has not had any overt bleeding.  AKI now  improved, eCrCl 55-60 ml/min.   PTA warfarin dose: 6 mg on Mon/Thurs, 4 mg on all other days  Goal of Therapy:  Anti-Xa level 0.6-1 units/ml 4hrs after LMWH dose given Monitor platelets by anticoagulation protocol: Yes   Plan:  -Lovenox 70 mg [place] q12h -CBC q72h -Monitor closely for s/sx bleeding -F/u INR tomorrow, watch renal function  Hughes Better, PharmD, BCPS Clinical Pharmacist Pager: [phone] 03/13/2014 6:56 PM

## 2014-03-13 NOTE — Progress Notes (Signed)
Patient ID: Theodore [name], male   DOB: 1940-08-05, 74 y.o.   MRN: [id number]    Subjective: NGT removed yesterday, no N/V.  Tolerating clears.  + flatus, + 3 stools.    Objective: Vital signs in last 24 hours: Temp:  [97.4 F (36.3 C)-98.3 F (36.8 C)] 98.3 F (36.8 C) (01/01 0746) Pulse Rate:  [46-106] 76 (01/01 0746) Resp:  [19-26] 26 (01/01 0746) BP: (107-143)/(57-80) 126/64 mmHg (01/01 0746) SpO2:  [85 %-100 %] 98 % (01/01 0817) Last BM Date: 03/13/14     Intake/Output from previous day: 12/31 0701 - 01/01 0700 In: 3070 [I.V.:2990; NG/GT:30; IV Piggyback:50] Out: 1225 [Urine:1225] Intake/Output this shift:    General appearance: alert and cooperative Resp: clear to auscultation bilaterally Cardio: RR&R GI: soft, sl more distended, non tender  Lab Results:   Recent Labs  03/12/14 0304 03/13/14 0259  WBC 8.6 6.9  HGB 7.1* 7.5*  HCT 23.5* 25.1*  PLT 283 301   BMET  Recent Labs  03/12/14 0304 03/13/14 0259  NA 143 142  K 4.1 3.6  CL 101 103  CO2 35* 30  GLUCOSE 82 94  BUN 31* 19  CREATININE 1.39* 0.99  CALCIUM 8.2* 8.2*   PT/INR  Recent Labs  03/12/14 0304 03/13/14 0259  LABPROT 21.5* 20.1*  INR 1.85* 1.70*   ABG  Recent Labs  03/11/14 0040  PHART 7.331*  HCO3 35.7*    Studies/Results: No results found.  Anti-infectives: Anti-infectives    Start     Dose/Rate Route Frequency Ordered Stop   03/11/14 1800  levofloxacin (LEVAQUIN) IVPB 750 mg  Status:  Discontinued     750 mg100 mL/hr over 90 Minutes Intravenous Every 48 hours 03/10/14 0543 03/10/14 0901   03/10/14 0900  piperacillin-tazobactam (ZOSYN) IVPB 3.375 g     3.375 g12.5 mL/hr over 240 Minutes Intravenous Every 8 hours 03/10/14 0847     03/09/14 2315  levofloxacin (LEVAQUIN) IVPB 500 mg  Status:  Discontinued     500 mg100 mL/hr over 60 Minutes Intravenous Every 24 hours 03/09/14 2303 03/10/14 0543      Assessment/Plan: SBO due to recent perf appy TX by perc drain - no  residual abscess. AKI appears resolved.  SBO - Improving.    Advance diet.  Full liquids and crackers OK for today.       LOS: 4 days    [facility] 03/13/2014

## 2014-03-13 NOTE — Progress Notes (Signed)
ANTIBIOTIC CONSULT NOTE - FOLLOW UP  Pharmacy Consult for Zosyn Indication: Intra-abdominal infection  Allergies  Allergen Reactions  . Aspirin Other (See Comments)    On coumadin    Patient Measurements: Height: 5\' 6"  (167.6 cm) Weight: 132 lb 15 oz (60.3 kg) IBW/kg (Calculated) : 63.8 Adjusted Body Weight:   Vital Signs: Temp: 98.3 F (36.8 C) (01/01 0746) Temp Source: Oral (01/01 0746) BP: 126/64 mmHg (01/01 0746) Pulse Rate: 76 (01/01 0746) Intake/Output from previous day: 12/31 0701 - 01/01 0700 In: 3200 [I.V.:3120; NG/GT:30; IV Piggyback:50] Out: 7989 [Urine:1225] Intake/Output from this shift: Total I/O In: 623 [P.O.:360; I.V.:263] Out: 300 [Urine:300]  Labs:  Recent Labs  03/11/14 0240 03/12/14 0304 03/13/14 0259  WBC 6.9 8.6 6.9  HGB 7.2* 7.1* 7.5*  PLT 281 283 301  CREATININE 2.19* 1.39* 0.99   Estimated Creatinine Clearance: 55.8 mL/min (by C-G formula based on Cr of 0.99). No results for input(s): VANCOTROUGH, VANCOPEAK, VANCORANDOM, GENTTROUGH, GENTPEAK, GENTRANDOM, TOBRATROUGH, TOBRAPEAK, TOBRARND, AMIKACINPEAK, AMIKACINTROU, AMIKACIN in the last 72 hours.   Microbiology: Recent Results (from the past 720 hour(s))  Culture, blood (routine x 2)     Status: None (Preliminary result)   Collection Time: 03/09/14  3:33 PM  Result Value Ref Range Status   Specimen Description BLOOD RIGHT ARM  Final   Special Requests BOTTLES DRAWN AEROBIC AND ANAEROBIC 6CC BOTTLES  Final   Culture NO GROWTH 3 DAYS  Final   Report Status PENDING  Incomplete  Culture, blood (routine x 2)     Status: None (Preliminary result)   Collection Time: 03/09/14  5:44 PM  Result Value Ref Range Status   Specimen Description BLOOD RIGHT ARM  Final   Special Requests BOTTLES DRAWN AEROBIC AND ANAEROBIC 10CC BOTTLES  Final   Culture NO GROWTH 3 DAYS  Final   Report Status PENDING  Incomplete  MRSA PCR Screening     Status: None   Collection Time: 03/10/14  5:47 AM  Result  Value Ref Range Status   MRSA by PCR NEGATIVE NEGATIVE Final    Comment:        The GeneXpert MRSA Assay (FDA approved for NASAL specimens only), is one component of a comprehensive MRSA colonization surveillance program. It is not intended to diagnose MRSA infection nor to guide or monitor treatment for MRSA infections.     Anti-infectives    Start     Dose/Rate Route Frequency Ordered Stop   03/11/14 1800  levofloxacin (LEVAQUIN) IVPB 750 mg  Status:  Discontinued     750 mg100 mL/hr over 90 Minutes Intravenous Every 48 hours 03/10/14 0543 03/10/14 0901   03/10/14 0900  piperacillin-tazobactam (ZOSYN) IVPB 3.375 g     3.375 g12.5 mL/hr over 240 Minutes Intravenous Every 8 hours 03/10/14 0847     03/09/14 2315  levofloxacin (LEVAQUIN) IVPB 500 mg  Status:  Discontinued     500 mg100 mL/hr over 60 Minutes Intravenous Every 24 hours 03/09/14 2303 03/10/14 0543      Assessment: 25 yoM on Zosyn day #4 for intraabdominal infection.  Patient remains afebrile and has a normal WBC.  Blood cultures have no growth to date. Patient's renal function has returned back to baseline at 0.99. Patient has good urine output. No plans for surgery yet.  Goal of Therapy:  Resolution of infection and clinical improvement  Plan:  - Continue Zosyn 3.375 g IV q8h (each dose over 4 hours) - F/U micro data, patient's clinical condition, and renal function  Theron Arista, PharmD Clinical Pharmacist - Resident Pager: [phone] 1/1/201611:28 AM

## 2014-03-14 DIAGNOSIS — K3533 Acute appendicitis with perforation and localized peritonitis, with abscess: Secondary | ICD-10-CM | POA: Insufficient documentation

## 2014-03-14 DIAGNOSIS — K353 Acute appendicitis with localized peritonitis: Secondary | ICD-10-CM

## 2014-03-14 LAB — CULTURE, BLOOD (ROUTINE X 2)
Culture: NO GROWTH
Culture: NO GROWTH

## 2014-03-14 LAB — TYPE AND SCREEN
ABO/RH(D): O POS
ANTIBODY SCREEN: NEGATIVE
Unit division: 0

## 2014-03-14 LAB — CBC WITH DIFFERENTIAL/PLATELET
Basophils Absolute: 0 10*3/uL (ref 0.0–0.1)
Basophils Relative: 0 % (ref 0–1)
EOS PCT: 3 % (ref 0–5)
Eosinophils Absolute: 0.2 10*3/uL (ref 0.0–0.7)
HCT: 27.9 % — ABNORMAL LOW (ref 39.0–52.0)
HEMOGLOBIN: 8.5 g/dL — AB (ref 13.0–17.0)
Lymphocytes Relative: 8 % — ABNORMAL LOW (ref 12–46)
Lymphs Abs: 0.6 10*3/uL — ABNORMAL LOW (ref 0.7–4.0)
MCH: 27.7 pg (ref 26.0–34.0)
MCHC: 30.5 g/dL (ref 30.0–36.0)
MCV: 90.9 fL (ref 78.0–100.0)
MONOS PCT: 13 % — AB (ref 3–12)
Monocytes Absolute: 1 10*3/uL (ref 0.1–1.0)
NEUTROS ABS: 6.2 10*3/uL (ref 1.7–7.7)
Neutrophils Relative %: 76 % (ref 43–77)
Platelets: 289 10*3/uL (ref 150–400)
RBC: 3.07 MIL/uL — ABNORMAL LOW (ref 4.22–5.81)
RDW: 17.9 % — ABNORMAL HIGH (ref 11.5–15.5)
WBC: 8.1 10*3/uL (ref 4.0–10.5)

## 2014-03-14 LAB — COMPREHENSIVE METABOLIC PANEL
ALT: 12 U/L (ref 0–53)
AST: 27 U/L (ref 0–37)
Albumin: 2.3 g/dL — ABNORMAL LOW (ref 3.5–5.2)
Alkaline Phosphatase: 67 U/L (ref 39–117)
Anion gap: 12 (ref 5–15)
BUN: 9 mg/dL (ref 6–23)
CO2: 26 mmol/L (ref 19–32)
CREATININE: 0.79 mg/dL (ref 0.50–1.35)
Calcium: 8.1 mg/dL — ABNORMAL LOW (ref 8.4–10.5)
Chloride: 104 mEq/L (ref 96–112)
GFR calc non Af Amer: 86 mL/min — ABNORMAL LOW (ref >90)
Glucose, Bld: 96 mg/dL (ref 70–99)
Potassium: 3.5 mmol/L (ref 3.5–5.1)
SODIUM: 142 mmol/L (ref 135–145)
Total Bilirubin: 0.7 mg/dL (ref 0.3–1.2)
Total Protein: 6.2 g/dL (ref 6.0–8.3)

## 2014-03-14 LAB — PROTIME-INR
INR: 1.85 — AB (ref 0.00–1.49)
Prothrombin Time: 21.5 seconds — ABNORMAL HIGH (ref 11.6–15.2)

## 2014-03-14 LAB — MAGNESIUM: Magnesium: 1.7 mg/dL (ref 1.5–2.5)

## 2014-03-14 LAB — PHOSPHORUS: Phosphorus: 1.9 mg/dL — ABNORMAL LOW (ref 2.3–4.6)

## 2014-03-14 MED ORDER — MONTELUKAST SODIUM 10 MG PO TABS: 10.0000 mg | ORAL_TABLET | Freq: Every day | ORAL | Status: DC

## 2014-03-14 MED ORDER — WHITE PETROLATUM GEL
Status: AC
  Administered 2014-03-14: 09:00:00
  Filled 2014-03-14: qty 5

## 2014-03-14 MED ORDER — DILTIAZEM HCL ER COATED BEADS 180 MG PO CP24
180.0000 mg | ORAL_CAPSULE | Freq: Every day | ORAL | Status: DC
  Administered 2014-03-14 – 2014-03-18 (×5): 180 mg via ORAL
  Filled 2014-03-14 (×7): qty 1

## 2014-03-14 MED ORDER — TRAZODONE 25 MG HALF TABLET
25.0000 mg | ORAL_TABLET | Freq: Every day | ORAL | Status: DC
  Administered 2014-03-14 – 2014-03-17 (×4): 25 mg via ORAL
  Filled 2014-03-14 (×5): qty 1

## 2014-03-14 MED ORDER — MONTELUKAST SODIUM 10 MG PO TABS
10.0000 mg | ORAL_TABLET | Freq: Every day | ORAL | Status: DC
  Administered 2014-03-14 – 2014-03-17 (×4): 10 mg via ORAL
  Filled 2014-03-14 (×5): qty 1

## 2014-03-14 NOTE — Progress Notes (Signed)
Patient ID: Theodore [name], male   DOB: May 12, 1940, 74 y.o.   MRN: [id number]    Subjective: Pt tolerated full liquids yesterday.  No n/v.  Continues to have flatus and BM.    Objective: Vital signs in last 24 hours: Temp:  [97.5 F (36.4 C)-98.9 F (37.2 C)] 97.5 F (36.4 C) (01/02 0700) Pulse Rate:  [45-95] 95 (01/02 0323) Resp:  [19-25] 23 (01/02 0323) BP: (116-135)/(54-74) 116/54 mmHg (01/02 0323) SpO2:  [90 %-100 %] 100 % (01/02 0845) Weight:  [154 lb 8.7 oz (70.1 kg)-158 lb 1.1 oz (71.7 kg)] 154 lb 8.7 oz (70.1 kg) (01/02 0323) Last BM Date: 03/13/14     Intake/Output from previous day: 01/01 0701 - 01/02 0700 In: 3590.1 [P.O.:600; I.V.:2590.1; Blood:350; IV Piggyback:50] Out: 975 [Urine:975] Intake/Output this shift:    General appearance: alert and cooperative Resp: clear to auscultation bilaterally Cardio: RR&R GI: soft, remains distended, non tender  Lab Results:   Recent Labs  03/12/14 0304 03/13/14 0259  WBC 8.6 6.9  HGB 7.1* 7.5*  HCT 23.5* 25.1*  PLT 283 301   BMET  Recent Labs  03/12/14 0304 03/13/14 0259  NA 143 142  K 4.1 3.6  CL 101 103  CO2 35* 30  GLUCOSE 82 94  BUN 31* 19  CREATININE 1.39* 0.99  CALCIUM 8.2* 8.2*   PT/INR  Recent Labs  03/13/14 0259 03/14/14 0225  LABPROT 20.1* 21.5*  INR 1.70* 1.85*   ABG No results for input(s): PHART, HCO3 in the last 72 hours.  Invalid input(s): PCO2, PO2  Studies/Results: No results found.  Anti-infectives: Anti-infectives    Start     Dose/Rate Route Frequency Ordered Stop   03/11/14 1800  levofloxacin (LEVAQUIN) IVPB 750 mg  Status:  Discontinued     750 mg100 mL/hr over 90 Minutes Intravenous Every 48 hours 03/10/14 0543 03/10/14 0901   03/10/14 0900  piperacillin-tazobactam (ZOSYN) IVPB 3.375 g     3.375 g12.5 mL/hr over 240 Minutes Intravenous Every 8 hours 03/10/14 0847     03/09/14 2315  levofloxacin (LEVAQUIN) IVPB 500 mg  Status:  Discontinued     500 mg100 mL/hr over  60 Minutes Intravenous Every 24 hours 03/09/14 2303 03/10/14 0543      Assessment/Plan: SBO due to recent perf appy TX by perc drain - no residual abscess. AKI appears resolved.  SBO - Improving.    Advance diet. Try regular food today.       LOS: 5 days    [facility] 03/14/2014

## 2014-03-14 NOTE — Progress Notes (Addendum)
[place] TEAM 1 - Stepdown/ICU TEAM Progress Note  Theodore [name] WFU:[id number] DOB: [birth year] DOA: 03/09/2014 PCP: Tula Nakayama, MD  Admit HPI / Brief Narrative: Theodore [name] is a 74 y.o. BM PMHx anxiety, atrial fibrillation, chronic diastolic CHF, pulmonary hypertension, asthma, COPD, lung nodules, HTN, . He had recent admission to [facility] 12/1 - 12/7 for appendiceal abscess and had a drain placed by IR (removed 12/13). He was discharged home with home health RN and PT. He was seen by PCP just one day prior to presentation and was felt to be gradually improving. On 12/28, he complained of SOB and was sent to ED. In ED, CT abd / pelv revealed findings concerning for SBO. In addition, he had acute encephalopathy, AKI, weakness, intermittent AF with RVR, and mildly elevated troponin. He was transferred to [facility] for further management.  HPI/Subjective: 1/2 A/O 4, states on chronic home O2 at 2 L via [place], states continues to smoke, negative N/V, negative CP, negative abdominal pain. Would like to and being around the ward, having episodes of insomnia..    Assessment/Plan: Sepsis/SBO/appendiceal abscess  -[facility] 12/1 - 12/7 for appendiceal abscess and had a drain placed by IR (removed 12/13). -Resolving, patient now on regular diet, tolerating without N/V/D -Continue Zosyn minimum 7-10 days?; discuss with ID in a.m. -Decrease normal saline to [facility]  -Ambulate patient around ward per shift  Acute on chronic hypoxemic and hypercarbic respiratory failure -Multifactorial acute exacerbation of COPD, sepsis secondary to appendiceal abscess, and small bowel obstruction. -Resolved patient on home O2 regimen of 2 L O2 via  -Continue DuoNeb QID -Continue Pulmicort nebs BID -Flutter valve q 4hr  -Restart Singulair 10 mg daily -Hold outpatient symbicort, spiriva.  A.Fib chronic with intermittent RVR - coumadin stopped secondary to GI bleed (supratherapeutic INR) -INR < 2 ; start Lovenox per  pharmacy -Discontinue Cardizem drip for rate control. -Start home dose of Cardizem 180 mg daily  Acute on chronic diastolic heart failure - echo from 04/15: EF 50-55%, mod LVH. -See A. fib with RVR -1/1 Transfuse one unit PRBC to maintain hemoglobin>8 -Hold outpatient torsemide (pt appears dry). -Strict I&O; since admission + 5.7 L -Daily a.m. Weight;  admission weight= 65.7 kg;    1/2 weight bed= 70.1 kg -Echocardiogram pending  Pulmonary Hypertension - PAP 74 from echo 04/15 -If BP improves would benefit from afterload reducing agent  Elevated Troponin/ Leak -Resolved   Acute renal failure  -Resolved, continue to monitor   Acute blood loss anemia/Anemia of chronic disease + iron deficiency -Supratherapeutic INR resolved -Start Lovenox per pharmacy for A. Fib -1/1 received 1 unit PRBC, will continue to monitor closely  Coagulopathy - INR supratherapeutic -Resolved  -See acute blood loss anemia  Acute metabolic encephalopathy -Multifactorial see above -Resolved  Anxiety -Monitor clinically. -Hold outpatient xanax, remeron, trazodone.  Insomnia  -Trazodone 25 mg QHS PRN    Code Status: FULL Family Communication: no family present at time of exam Disposition Plan: Per surgery    Consultants: Dr.Faera Byerly (surgery) Dr. Kara Mead [facility] M)   Procedure/Significant Events: 1/1 transfuse one unit PRBC    Culture 12/28 blood  right arm 2 negative    Antibiotics: Levaquin, start date 12/28, day 1/x. Zosyn 12/29>>  DVT prophylaxis: SCD/Lovenox   Devices    LINES / TUBES:      Continuous Infusions: . sodium chloride Stopped (03/10/14 1900)  . sodium chloride 75 mL/hr at 03/13/14 1900  . diltiazem (CARDIZEM) infusion 5 mg/hr (03/14/14 1300)  Objective: VITAL SIGNS: Temp: 97.7 F (36.5 C) (01/02 1100) Temp Source: Oral (01/02 1100) BP: 119/74 mmHg (01/02 1125) Pulse Rate: 45 (01/02 1125) SPO2; FIO2:   Intake/Output Summary (Last  24 hours) at 03/14/14 1329 Last data filed at 03/14/14 1300  Gross per 24 hour  Intake 3827.08 ml  Output    675 ml  Net 3152.08 ml     Exam: General: A/O 4, NAD, No acute respiratory distress Lungs: Clear to auscultation bilaterally, positive bronchial congestion, negative wheezing Cardiovascular: Regular rate and rhythm without murmur gallop or rub normal S1 and S2 Abdomen: Nontender, distended, soft, bowel sounds positive, no rebound, no ascites, no appreciable mass Extremities: No significant cyanosis, clubbing, or edema bilateral lower extremities  Data Reviewed: Basic Metabolic Panel:  Recent Labs Lab 03/09/14 1533 03/10/14 0759 03/11/14 0240 03/12/14 0304 03/13/14 0259  NA 140 136 140 143 142  K 4.2 5.1 4.8 4.1 3.6  CL 91* 86* 89* 101 103  CO2 37* 33* 38* 35* 30  GLUCOSE 114* 121* 113* 82 94  BUN 35* 37* 45* 31* 19  CREATININE 2.27* 2.35* 2.19* 1.39* 0.99  CALCIUM 9.4 8.5 8.4 8.2* 8.2*  MG  --  1.9 2.1 2.1 1.8  PHOS  --  5.7* 5.0* 2.1* 1.4*   Liver Function Tests:  Recent Labs Lab 03/09/14 1533  AST 23  ALT 12  ALKPHOS 96  BILITOT 1.0  PROT 8.3  ALBUMIN 3.3*    Recent Labs Lab 03/09/14 1533  LIPASE 15    Recent Labs Lab 03/09/14 2309  AMMONIA 49*   CBC:  Recent Labs Lab 03/09/14 1533 03/10/14 0759 03/11/14 0240 03/12/14 0304 03/13/14 0259  WBC 8.8 7.7 6.9 8.6 6.9  NEUTROABS 7.0  --   --   --   --   HGB 9.1* 7.4* 7.2* 7.1* 7.5*  HCT 30.1* 24.8* 23.7* 23.5* 25.1*  MCV 92.0 90.2 88.8 90.4 91.6  PLT 343 305 281 283 301   Cardiac Enzymes:  Recent Labs Lab 03/09/14 1533 03/09/14 2239 03/10/14 1321  TROPONINI 0.04* 0.04* 0.03   BNP (last 3 results)  Recent Labs  06/16/13 1554 06/17/13 0537 07/09/13 0555  PROBNP 3560.0* 3222.0* 3842.0*   CBG: No results for input(s): GLUCAP in the last 168 hours.  Recent Results (from the past 240 hour(s))  Culture, blood (routine x 2)     Status: None   Collection Time: 03/09/14   3:33 PM  Result Value Ref Range Status   Specimen Description BLOOD RIGHT ARM  Final   Special Requests BOTTLES DRAWN AEROBIC AND ANAEROBIC 6CC BOTTLES  Final   Culture NO GROWTH 5 DAYS  Final   Report Status 03/14/2014 FINAL  Final  Culture, blood (routine x 2)     Status: None   Collection Time: 03/09/14  5:44 PM  Result Value Ref Range Status   Specimen Description BLOOD RIGHT ARM  Final   Special Requests BOTTLES DRAWN AEROBIC AND ANAEROBIC 10CC BOTTLES  Final   Culture NO GROWTH 5 DAYS  Final   Report Status 03/14/2014 FINAL  Final  MRSA PCR Screening     Status: None   Collection Time: 03/10/14  5:47 AM  Result Value Ref Range Status   MRSA by PCR NEGATIVE NEGATIVE Final    Comment:        The GeneXpert MRSA Assay (FDA approved for NASAL specimens only), is one component of a comprehensive MRSA colonization surveillance program. It is not intended to diagnose MRSA infection  nor to guide or monitor treatment for MRSA infections.      Studies:  Recent x-ray studies have been reviewed in detail by the Attending Physician  Scheduled Meds:  Scheduled Meds: . sodium chloride   Intravenous Once  . antiseptic oral rinse  7 mL Mouth Rinse BID  . budesonide (PULMICORT) nebulizer solution  0.5 mg Nebulization BID  . enoxaparin (LOVENOX) injection  70 mg Subcutaneous Q12H  . ipratropium-albuterol  3 mL Nebulization Q6H  . piperacillin-tazobactam (ZOSYN)  IV  3.375 g Intravenous Q8H  . senna-docusate  1 tablet Oral QHS  . sodium chloride  3 mL Intravenous Q12H    Time spent on care of this patient: 40 mins   Allie Bossier , MD   [facility] Office  [phone] Pager [phone]  On-Call/Text Page:      [email or web address]      password [email or web address]  If 7PM-7AM, please contact night-coverage [email or web address] Password [email or web address] 03/14/2014, 1:29 PM   LOS: 5 days

## 2014-03-15 DIAGNOSIS — I369 Nonrheumatic tricuspid valve disorder, unspecified: Secondary | ICD-10-CM

## 2014-03-15 LAB — CBC WITH DIFFERENTIAL/PLATELET
BASOS ABS: 0 10*3/uL (ref 0.0–0.1)
BASOS PCT: 0 % (ref 0–1)
EOS PCT: 2 % (ref 0–5)
Eosinophils Absolute: 0.2 10*3/uL (ref 0.0–0.7)
HCT: 28.4 % — ABNORMAL LOW (ref 39.0–52.0)
Hemoglobin: 8.8 g/dL — ABNORMAL LOW (ref 13.0–17.0)
LYMPHS ABS: 0.7 10*3/uL (ref 0.7–4.0)
LYMPHS PCT: 7 % — AB (ref 12–46)
MCH: 28.9 pg (ref 26.0–34.0)
MCHC: 31 g/dL (ref 30.0–36.0)
MCV: 93.4 fL (ref 78.0–100.0)
MONO ABS: 1.1 10*3/uL — AB (ref 0.1–1.0)
Monocytes Relative: 12 % (ref 3–12)
Neutro Abs: 6.8 10*3/uL (ref 1.7–7.7)
Neutrophils Relative %: 79 % — ABNORMAL HIGH (ref 43–77)
PLATELETS: 276 10*3/uL (ref 150–400)
RBC: 3.04 MIL/uL — ABNORMAL LOW (ref 4.22–5.81)
RDW: 18 % — ABNORMAL HIGH (ref 11.5–15.5)
WBC: 8.8 10*3/uL (ref 4.0–10.5)

## 2014-03-15 LAB — COMPREHENSIVE METABOLIC PANEL
ALT: 13 U/L (ref 0–53)
AST: 20 U/L (ref 0–37)
Albumin: 2.2 g/dL — ABNORMAL LOW (ref 3.5–5.2)
Alkaline Phosphatase: 66 U/L (ref 39–117)
Anion gap: 0 — ABNORMAL LOW (ref 5–15)
BUN: 7 mg/dL (ref 6–23)
CALCIUM: 8.1 mg/dL — AB (ref 8.4–10.5)
CO2: 38 mmol/L — AB (ref 19–32)
Chloride: 104 mEq/L (ref 96–112)
Creatinine, Ser: 0.82 mg/dL (ref 0.50–1.35)
GFR calc Af Amer: >90 mL/min (ref >90)
GFR calc non Af Amer: 85 mL/min — ABNORMAL LOW (ref >90)
Glucose, Bld: 100 mg/dL — ABNORMAL HIGH (ref 70–99)
Potassium: 3.6 mmol/L (ref 3.5–5.1)
Sodium: 142 mmol/L (ref 135–145)
Total Bilirubin: 0.4 mg/dL (ref 0.3–1.2)
Total Protein: 6.3 g/dL (ref 6.0–8.3)

## 2014-03-15 LAB — PROTIME-INR
INR: 1.87 — AB (ref 0.00–1.49)
Prothrombin Time: 21.7 seconds — ABNORMAL HIGH (ref 11.6–15.2)

## 2014-03-15 LAB — MAGNESIUM: MAGNESIUM: 1.7 mg/dL (ref 1.5–2.5)

## 2014-03-15 MED ORDER — IPRATROPIUM-ALBUTEROL 0.5-2.5 (3) MG/3ML IN SOLN
3.0000 mL | Freq: Four times a day (QID) | RESPIRATORY_TRACT | Status: DC
  Administered 2014-03-16 (×2): 3 mL via RESPIRATORY_TRACT
  Filled 2014-03-15 (×2): qty 3

## 2014-03-15 NOTE — Progress Notes (Signed)
Patient ID: Theodore [name], male   DOB: January 02, 1941, 74 y.o.   MRN: [id number]    Subjective: Pt tolerated regular diet yesterday.  No n/v.  Continues to pass flatus and have BMs.    Objective: Vital signs in last 24 hours: Temp:  [97.7 F (36.5 C)-99 F (37.2 C)] 99 F (37.2 C) (01/03 0700) Pulse Rate:  [45-88] 55 (01/03 0719) Resp:  [19-27] 24 (01/03 0719) BP: (113-143)/(67-89) 140/76 mmHg (01/03 0717) SpO2:  [94 %-100 %] 98 % (01/03 0850) Weight:  [150 lb 9.2 oz (68.3 kg)] 150 lb 9.2 oz (68.3 kg) (01/03 0320) Last BM Date: 03/14/14     Intake/Output from previous day: 01/02 0701 - 01/03 0700 In: 0175 [P.O.:800; I.V.:675; IV Piggyback:50] Out: 500 [Urine:500] Intake/Output this shift: Total I/O In: 10 [I.V.:10] Out: -   General appearance: alert and cooperative Resp: clear to auscultation bilaterally Cardio: RR&R GI: soft, remains distended, non tender  Lab Results:   Recent Labs  03/14/14 1525 03/15/14 0230  WBC 8.1 8.8  HGB 8.5* 8.8*  HCT 27.9* 28.4*  PLT 289 276   BMET  Recent Labs  03/14/14 1525 03/15/14 0230  NA 142 142  K 3.5 3.6  CL 104 104  CO2 26 38*  GLUCOSE 96 100*  BUN 9 7  CREATININE 0.79 0.82  CALCIUM 8.1* 8.1*   PT/INR  Recent Labs  03/14/14 0225 03/15/14 0230  LABPROT 21.5* 21.7*  INR 1.85* 1.87*   ABG No results for input(s): PHART, HCO3 in the last 72 hours.  Invalid input(s): PCO2, PO2  Studies/Results: No results found.  Anti-infectives: Anti-infectives    Start     Dose/Rate Route Frequency Ordered Stop   03/11/14 1800  levofloxacin (LEVAQUIN) IVPB 750 mg  Status:  Discontinued     750 mg100 mL/hr over 90 Minutes Intravenous Every 48 hours 03/10/14 0543 03/10/14 0901   03/10/14 0900  piperacillin-tazobactam (ZOSYN) IVPB 3.375 g     3.375 g12.5 mL/hr over 240 Minutes Intravenous Every 8 hours 03/10/14 0847     03/09/14 2315  levofloxacin (LEVAQUIN) IVPB 500 mg  Status:  Discontinued     500 mg100 mL/hr over 60  Minutes Intravenous Every 24 hours 03/09/14 2303 03/10/14 0543      Assessment/Plan: SBO due to recent perf appy TX by perc drain - no residual abscess. AKI appears resolved.  SBO - resolved. Regular diet.   Will sign off, call for questions.        LOS: 6 days    [facility] 03/15/2014

## 2014-03-15 NOTE — Progress Notes (Signed)
  Echocardiogram 2D Echocardiogram has been performed.  Diamond Nickel 03/15/2014, 3:39 PM

## 2014-03-15 NOTE — Progress Notes (Signed)
[place] TEAM 1 - Stepdown/ICU TEAM Progress Note  Markas Aldredge Dacey IEP:[id number] DOB: 28-Apr-1940 DOA: 03/09/2014 PCP: Tula Nakayama, MD  Admit HPI / Brief Narrative: Theodore [name] is a 74 y.o. BM PMHx anxiety, atrial fibrillation, chronic diastolic CHF, pulmonary hypertension, asthma, COPD, lung nodules, HTN, . He had recent admission to [facility] 12/1 - 12/7 for appendiceal abscess and had a drain placed by IR (removed 12/13). He was discharged home with home health RN and PT. He was seen by PCP just one day prior to presentation and was felt to be gradually improving. On 12/28, he complained of SOB and was sent to ED. In ED, CT abd / pelv revealed findings concerning for SBO. In addition, he had acute encephalopathy, AKI, weakness, intermittent AF with RVR, and mildly elevated troponin. He was transferred to [facility] for further management.  HPI/Subjective: 1/3 A/O 4, states on chronic home O2 at 2 L via [place], states continues to smoke, negative N/V, negative CP, negative abdominal pain. Would like to and being around the ward, having episodes of insomnia..    Assessment/Plan: Sepsis/SBO/appendiceal abscess  -[facility] 12/1 - 12/7 for appendiceal abscess and had a drain placed by IR (removed 12/13). -Resolving, patient now on regular diet, tolerating without N/V/D -Continue appropriate antibiotic for minimum 14 days. Plan discussed with Dr. Bobby Rumpf (infectious disease). -Ambulate patient around ward per shift  Acute on chronic hypoxemic and hypercarbic respiratory failure -Multifactorial acute exacerbation of COPD, sepsis secondary to appendiceal abscess, and small bowel obstruction. -Resolved patient on home O2 regimen of 2 L O2 via [place] -Continue DuoNeb QID -Continue Pulmicort nebs BID -Flutter valve q 4hr  -Restart Singulair 10 mg daily -Hold outpatient symbicort, spiriva.  A.Fib chronic with intermittent RVR - coumadin stopped secondary to GI bleed (supratherapeutic INR) -INR  < 2 ; continue  Lovenox per pharmacy -Discontinue Cardizem drip for rate control. -Continue home dose of Cardizem 180 mg daily  Acute on chronic diastolic heart failure - echo from 04/15: EF 50-55%, mod LVH. -See A. fib with RVR -1/1 Transfuse one unit PRBC to maintain hemoglobin>8 -Hold outpatient torsemide (pt appears dry). -Strict I&O; since admission + 6.2 L -Daily a.m. Weight;  admission weight= 65.7 kg;    1/3 weight bed= 68.3 kg -Echocardiogram pending  Pulmonary Hypertension - PAP 74 from echo 04/15 -If BP improves would benefit from afterload reducing agent  Elevated Troponin/ Leak -Resolved   Acute renal failure  -Resolved, continue to monitor   Acute blood loss anemia/Anemia of chronic disease + iron deficiency -Supratherapeutic INR resolved -Start Lovenox per pharmacy for A. Fib -1/1 received 1 unit PRBC, will continue to monitor closely  Coagulopathy - INR supratherapeutic -Resolved  -See acute blood loss anemia  Acute metabolic encephalopathy -Multifactorial see above -Resolved  Anxiety -Monitor clinically. -Hold outpatient xanax, remeron, trazodone.  Insomnia  -Trazodone 25 mg QHS PRN    Code Status: FULL Family Communication: no family present at time of exam Disposition Plan: Per surgery    Consultants: Dr.Faera Byerly (surgery) Dr. Kara Mead [facility] M)   Procedure/Significant Events: 1/1 transfuse one unit PRBC    Culture 12/28 blood  right arm 2 negative    Antibiotics: Levaquin, start date 12/28, day 1/x. Zosyn 12/29>>  DVT prophylaxis: SCD/Lovenox   Devices    LINES / TUBES:      Continuous Infusions: . sodium chloride Stopped (03/10/14 1900)  . sodium chloride 10 mL/hr at 03/14/14 1900    Objective: VITAL SIGNS: Temp: 98.7 F (  37.1 C) (01/03 1608) Temp Source: Oral (01/03 1608) BP: 116/81 mmHg (01/03 1604) Pulse Rate: 96 (01/03 1605) SPO2; FIO2:   Intake/Output Summary (Last 24 hours) at 03/15/14  1851 Last data filed at 03/15/14 1800  Gross per 24 hour  Intake    740 ml  Output    800 ml  Net    -60 ml     Exam: General: A/O 4, NAD, No acute respiratory distress Lungs: Clear to auscultation bilaterally, positive bronchial congestion, negative wheezing Cardiovascular: Regular rate and rhythm without murmur gallop or rub normal S1 and S2 Abdomen: Nontender, distended, soft, bowel sounds positive, no rebound, no ascites, no appreciable mass Extremities: No significant cyanosis, clubbing, or edema bilateral lower extremities  Data Reviewed: Basic Metabolic Panel:  Recent Labs Lab 03/10/14 0759 03/11/14 0240 03/12/14 0304 03/13/14 0259 03/14/14 1525 03/15/14 0230  NA 136 140 143 142 142 142  K 5.1 4.8 4.1 3.6 3.5 3.6  CL 86* 89* 101 103 104 104  CO2 33* 38* 35* 30 26 38*  GLUCOSE 121* 113* 82 94 96 100*  BUN 37* 45* 31* 19 9 7   CREATININE 2.35* 2.19* 1.39* 0.99 0.79 0.82  CALCIUM 8.5 8.4 8.2* 8.2* 8.1* 8.1*  MG 1.9 2.1 2.1 1.8 1.7 1.7  PHOS 5.7* 5.0* 2.1* 1.4* 1.9*  --    Liver Function Tests:  Recent Labs Lab 03/09/14 1533 03/14/14 1525 03/15/14 0230  AST 23 27 20   ALT 12 12 13   ALKPHOS 96 67 66  BILITOT 1.0 0.7 0.4  PROT 8.3 6.2 6.3  ALBUMIN 3.3* 2.3* 2.2*    Recent Labs Lab 03/09/14 1533  LIPASE 15    Recent Labs Lab 03/09/14 2309  AMMONIA 49*   CBC:  Recent Labs Lab 03/09/14 1533  03/11/14 0240 03/12/14 0304 03/13/14 0259 03/14/14 1525 03/15/14 0230  WBC 8.8  < > 6.9 8.6 6.9 8.1 8.8  NEUTROABS 7.0  --   --   --   --  6.2 6.8  HGB 9.1*  < > 7.2* 7.1* 7.5* 8.5* 8.8*  HCT 30.1*  < > 23.7* 23.5* 25.1* 27.9* 28.4*  MCV 92.0  < > 88.8 90.4 91.6 90.9 93.4  PLT 343  < > 281 283 301 289 276  < > = values in this interval not displayed. Cardiac Enzymes:  Recent Labs Lab 03/09/14 1533 03/09/14 2239 03/10/14 1321  TROPONINI 0.04* 0.04* 0.03   BNP (last 3 results)  Recent Labs  06/16/13 1554 06/17/13 0537 07/09/13 0555   PROBNP 3560.0* 3222.0* 3842.0*   CBG: No results for input(s): GLUCAP in the last 168 hours.  Recent Results (from the past 240 hour(s))  Culture, blood (routine x 2)     Status: None   Collection Time: 03/09/14  3:33 PM  Result Value Ref Range Status   Specimen Description BLOOD RIGHT ARM  Final   Special Requests BOTTLES DRAWN AEROBIC AND ANAEROBIC 6CC BOTTLES  Final   Culture NO GROWTH 5 DAYS  Final   Report Status 03/14/2014 FINAL  Final  Culture, blood (routine x 2)     Status: None   Collection Time: 03/09/14  5:44 PM  Result Value Ref Range Status   Specimen Description BLOOD RIGHT ARM  Final   Special Requests BOTTLES DRAWN AEROBIC AND ANAEROBIC 10CC BOTTLES  Final   Culture NO GROWTH 5 DAYS  Final   Report Status 03/14/2014 FINAL  Final  MRSA PCR Screening     Status: None  Collection Time: 03/10/14  5:47 AM  Result Value Ref Range Status   MRSA by PCR NEGATIVE NEGATIVE Final    Comment:        The GeneXpert MRSA Assay (FDA approved for NASAL specimens only), is one component of a comprehensive MRSA colonization surveillance program. It is not intended to diagnose MRSA infection nor to guide or monitor treatment for MRSA infections.      Studies:  Recent x-ray studies have been reviewed in detail by the Attending Physician  Scheduled Meds:  Scheduled Meds: . sodium chloride   Intravenous Once  . antiseptic oral rinse  7 mL Mouth Rinse BID  . budesonide (PULMICORT) nebulizer solution  0.5 mg Nebulization BID  . diltiazem  180 mg Oral Daily  . enoxaparin (LOVENOX) injection  70 mg Subcutaneous Q12H  . ipratropium-albuterol  3 mL Nebulization Q6H  . montelukast  10 mg Oral QHS  . piperacillin-tazobactam (ZOSYN)  IV  3.375 g Intravenous Q8H  . senna-docusate  1 tablet Oral QHS  . sodium chloride  3 mL Intravenous Q12H  . traZODone  25 mg Oral QHS    Time spent on care of this patient: 40 mins   Allie Bossier , MD   [facility] Office   [phone] Pager - [phone]  On-Call/Text Page:      [email or web address]      password [email or web address]  If 7PM-7AM, please contact night-coverage [email or web address] Password [email or web address] 03/15/2014, 6:51 PM   LOS: 6 days

## 2014-03-15 NOTE — Progress Notes (Signed)
Respiratory assessment done. Patient has upper airway wheezes but worse Rhonchi throughout. Started patient on flutter device post treatment and changed to QID per protocol with a PRN at night as needed. Patient states he takes an inhaler 4 times a day at home but none on file match that. Educating patient on MDI's and will continue to monitor.

## 2014-03-16 LAB — CBC WITH DIFFERENTIAL/PLATELET
BASOS ABS: 0 10*3/uL (ref 0.0–0.1)
BASOS PCT: 0 % (ref 0–1)
EOS ABS: 0.2 10*3/uL (ref 0.0–0.7)
EOS PCT: 2 % (ref 0–5)
HCT: 28.5 % — ABNORMAL LOW (ref 39.0–52.0)
Hemoglobin: 8.5 g/dL — ABNORMAL LOW (ref 13.0–17.0)
Lymphocytes Relative: 10 % — ABNORMAL LOW (ref 12–46)
Lymphs Abs: 0.8 10*3/uL (ref 0.7–4.0)
MCH: 27.6 pg (ref 26.0–34.0)
MCHC: 29.8 g/dL — AB (ref 30.0–36.0)
MCV: 92.5 fL (ref 78.0–100.0)
Monocytes Absolute: 0.9 10*3/uL (ref 0.1–1.0)
Monocytes Relative: 10 % (ref 3–12)
Neutro Abs: 6.8 10*3/uL (ref 1.7–7.7)
Neutrophils Relative %: 78 % — ABNORMAL HIGH (ref 43–77)
PLATELETS: 279 10*3/uL (ref 150–400)
RBC: 3.08 MIL/uL — ABNORMAL LOW (ref 4.22–5.81)
RDW: 18.2 % — AB (ref 11.5–15.5)
WBC: 8.7 10*3/uL (ref 4.0–10.5)

## 2014-03-16 LAB — MAGNESIUM: Magnesium: 1.5 mg/dL (ref 1.5–2.5)

## 2014-03-16 LAB — COMPREHENSIVE METABOLIC PANEL
ALT: 13 U/L (ref 0–53)
AST: 17 U/L (ref 0–37)
Albumin: 2.3 g/dL — ABNORMAL LOW (ref 3.5–5.2)
Alkaline Phosphatase: 64 U/L (ref 39–117)
Anion gap: 6 (ref 5–15)
CO2: 32 mmol/L (ref 19–32)
CREATININE: 0.78 mg/dL (ref 0.50–1.35)
Calcium: 8.4 mg/dL (ref 8.4–10.5)
Chloride: 106 mEq/L (ref 96–112)
GFR calc Af Amer: >90 mL/min (ref >90)
GFR calc non Af Amer: 87 mL/min — ABNORMAL LOW (ref >90)
GLUCOSE: 95 mg/dL (ref 70–99)
POTASSIUM: 3.9 mmol/L (ref 3.5–5.1)
SODIUM: 144 mmol/L (ref 135–145)
Total Bilirubin: 0.5 mg/dL (ref 0.3–1.2)
Total Protein: 6.2 g/dL (ref 6.0–8.3)

## 2014-03-16 LAB — PROTIME-INR
INR: 1.52 — AB (ref 0.00–1.49)
PROTHROMBIN TIME: 18.5 s — AB (ref 11.6–15.2)

## 2014-03-16 MED ORDER — TORSEMIDE 20 MG PO TABS
40.0000 mg | ORAL_TABLET | Freq: Every day | ORAL | Status: DC
  Administered 2014-03-16 – 2014-03-18 (×3): 40 mg via ORAL
  Filled 2014-03-16 (×3): qty 2

## 2014-03-16 MED ORDER — COUMADIN BOOK
1.0000 | Freq: Once | Status: AC
  Administered 2014-03-16: 1
  Filled 2014-03-16: qty 1

## 2014-03-16 MED ORDER — WARFARIN VIDEO
1.0000 | Freq: Once | Status: AC
  Administered 2014-03-17: 1

## 2014-03-16 MED ORDER — WARFARIN SODIUM 4 MG PO TABS
4.0000 mg | ORAL_TABLET | Freq: Once | ORAL | Status: AC
  Administered 2014-03-16: 4 mg via ORAL
  Filled 2014-03-16: qty 1

## 2014-03-16 MED ORDER — TIMOLOL MALEATE 0.5 % OP SOLN
1.0000 [drp] | Freq: Two times a day (BID) | OPHTHALMIC | Status: DC
  Administered 2014-03-16 – 2014-03-18 (×4): 1 [drp] via OPHTHALMIC
  Filled 2014-03-16: qty 5

## 2014-03-16 MED ORDER — TRAVOPROST 0.004 % OP SOLN: 1.0000 [drp] | Freq: Every day | OPHTHALMIC | Status: DC

## 2014-03-16 MED ORDER — BRIMONIDINE TARTRATE 0.2 % OP SOLN
1.0000 [drp] | Freq: Two times a day (BID) | OPHTHALMIC | Status: DC
  Administered 2014-03-16 – 2014-03-18 (×4): 1 [drp] via OPHTHALMIC
  Filled 2014-03-16: qty 5

## 2014-03-16 MED ORDER — TIOTROPIUM BROMIDE MONOHYDRATE 18 MCG IN CAPS
18.0000 ug | ORAL_CAPSULE | Freq: Every day | RESPIRATORY_TRACT | Status: DC
  Administered 2014-03-17 – 2014-03-18 (×2): 18 ug via RESPIRATORY_TRACT
  Filled 2014-03-16: qty 5

## 2014-03-16 MED ORDER — LATANOPROST 0.005 % OP SOLN
1.0000 [drp] | Freq: Every day | OPHTHALMIC | Status: DC
  Administered 2014-03-16 – 2014-03-17 (×2): 1 [drp] via OPHTHALMIC
  Filled 2014-03-16: qty 2.5

## 2014-03-16 MED ORDER — MIRTAZAPINE 15 MG PO TABS
15.0000 mg | ORAL_TABLET | Freq: Every day | ORAL | Status: DC
  Administered 2014-03-16 – 2014-03-17 (×2): 15 mg via ORAL
  Filled 2014-03-16 (×3): qty 1

## 2014-03-16 MED ORDER — BRIMONIDINE TARTRATE-TIMOLOL 0.2-0.5 % OP SOLN: 1.0000 [drp] | Freq: Two times a day (BID) | OPHTHALMIC | Status: DC

## 2014-03-16 MED ORDER — WARFARIN - PHARMACIST DOSING INPATIENT
Freq: Every day | Status: DC
  Administered 2014-03-17: 18:00:00

## 2014-03-16 MED ORDER — BUDESONIDE-FORMOTEROL FUMARATE 160-4.5 MCG/ACT IN AERO
2.0000 | INHALATION_SPRAY | Freq: Two times a day (BID) | RESPIRATORY_TRACT | Status: DC
  Administered 2014-03-16 – 2014-03-18 (×4): 2 via RESPIRATORY_TRACT
  Filled 2014-03-16: qty 6

## 2014-03-16 NOTE — Progress Notes (Signed)
Utilization review completed.  

## 2014-03-16 NOTE — Progress Notes (Signed)
Medicare Important Message given? YES  (If response is "NO", the following Medicare IM given date fields will be blank)  Date Medicare IM given: 03/16/14 Medicare IM given by:  Lorenia Hoston  

## 2014-03-16 NOTE — Progress Notes (Addendum)
Attempted to call report to RN on 5W, awaiting call back. VS stable. Pt aware of transfer.  2135: report called to [facility] on 5W, VS stable. Will transfer pt and belongings to 5W.

## 2014-03-16 NOTE — Progress Notes (Signed)
ANTICOAGULATION CONSULT NOTE - Initial Consult  Pharmacy Consult for Lovenox and Coumadin Indication: atrial fibrillation  Allergies  Allergen Reactions  . Aspirin Other (See Comments)    On coumadin    Patient Measurements: Height: 5\' 6"  (167.6 cm) Weight: 153 lb 10.6 oz (69.7 kg) IBW/kg (Calculated) : 63.8 Heparin Dosing Weight:   Vital Signs: Temp: 97.6 F (36.4 C) (01/04 1123) Temp Source: Oral (01/04 1123) BP: 116/89 mmHg (01/04 1123) Pulse Rate: 92 (01/04 1123)  Labs:  Recent Labs  03/14/14 0225  03/14/14 1525 03/15/14 0230 03/16/14 0322  HGB  --   < > 8.5* 8.8* 8.5*  HCT  --   --  27.9* 28.4* 28.5*  PLT  --   --  289 276 279  LABPROT 21.5*  --   --  21.7* 18.5*  INR 1.85*  --   --  1.87* 1.52*  CREATININE  --   --  0.79 0.82 0.78  < > = values in this interval not displayed.  Estimated Creatinine Clearance: 73.1 mL/min (by C-G formula based on Cr of 0.78).   Medical History: Past Medical History  Diagnosis Date  . Glaucoma     uses eye drops daily  . Allergic rhinitis     takes Singulair at bedtime  . Atrial fibrillation     takes Coumadin daily  . Diastolic heart failure     LVEF 37-34%, rate 2 diastolic dysfunction 04/8766  . Pulmonary hypertension     70 mmHg 03/2012  . Asthma     Albuterol neb and inhaler as needed  . Anxiety     takes Xanax daily as needed  . COPD (chronic obstructive pulmonary disease)     Symbicort daily  . Essential hypertension, benign     takes Metoprolol and Diltiazem daily  . Anemia     takes ferrous sulfate daily  . History of bronchitis   . Shortness of breath     with exertion  . Numbness     fingertips on both hands  . Lung nodules   . History of blood transfusion     Medications:  Scheduled:  . antiseptic oral rinse  7 mL Mouth Rinse BID  . budesonide (PULMICORT) nebulizer solution  0.5 mg Nebulization BID  . diltiazem  180 mg Oral Daily  . enoxaparin (LOVENOX) injection  70 mg Subcutaneous Q12H  .  ipratropium-albuterol  3 mL Nebulization QID  . montelukast  10 mg Oral QHS  . piperacillin-tazobactam (ZOSYN)  IV  3.375 g Intravenous Q8H  . senna-docusate  1 tablet Oral QHS  . sodium chloride  3 mL Intravenous Q12H  . traZODone  25 mg Oral QHS    Assessment: 74yo male admitted with supratherapeutic INR on home dose of Coumadin of 4mg  daily x 6mg  on Mon/Thur.  He has been on full dose Lovenox since 1/1, have contacted MD and OK to resume Coumadin as well.  Do not see any probable cause for increased INR on med rec (e.g. Antibiotics), however expect pt with decreased PO intake given abdominal complaints on admission and expect this may have contributed.  INR 1.52 this AM, Hg, 8.5, pltc wnl.  Pt received Vit K on 12/30.  Goal of Therapy:  INR 2-3 Monitor platelets by anticoagulation protocol: Yes   Plan:  1-  Coumadin 4mg  2-  Daily INR 3-  Coumadin book and video 4-  Continue Lovenox 70mg  SQ q12  Gracy Bruins, [facility]

## 2014-03-16 NOTE — Progress Notes (Signed)
[name] - Stepdown/ICU TEAM Progress Note  Theodore [name] Gajda GUR:[id number] DOB: 1940/08/28 DOA: 03/09/2014 PCP: Tula Nakayama, MD  Admit HPI / Brief Narrative: 74 y.o. M Hx anxiety, atrial fibrillation, chronic diastolic CHF, pulmonary hypertension, asthma, COPD, lung nodules, and HTN who had recent admission to [facility] 12/1 - 12/7 for appendiceal abscess and had a drain placed by IR (removed 12/13). He was discharged home with home health RN and PT. He was seen by PCP just one day prior to presentation and was felt to be gradually improving. On 12/28, he complained of SOB and was sent to ED. In the ED CT abd / pelv revealed findings concerning for SBO. In addition, he had acute encephalopathy, AKI, weakness, intermittent AF with RVR, and mildly elevated troponin.  He was transferred to [facility] for further management.  HPI/Subjective: Pt is sitting up in a chair comfortably.  He does c/o some sob w/ exertion, and does not feel this is back to his baseline as of yet.  He denies abdom pain.  He is tolerating his diet w/o trouble, but is "taking it easy" when he eats so as to "not push it too hard."     Assessment/Plan:  Sepsis / SBO / appendiceal abscess  -admitted to [facility] 12/1 - 12/7 for appendiceal abscess and had a drain placed by IR (removed 12/13) -now on regular diet without N/V/D -continue antibiotic for 14 days -Ambulate patient around ward per shift  Acute on chronic hypoxemic and hypercarbic respiratory failure -Multifactorial acute exacerbation of COPD, sepsis secondary to appendiceal abscess, and small bowel obstruction -patient on home O2 regimen of 2 L O2 via [place] -respiratory status remains quite fragile, especially w/ attempts to ambulate - maximize medical tx - begin PT/OT   Chronic A Fib with intermittent RVR -coumadin stopped secondary to GI bleed (supratherapeutic INR) -INR < 2 ; continue  Lovenox per pharmacy but resume coumadin now w/ no sign of bleeding    -Continue home dose of Cardizem 180 mg daily  Acute on chronic diastolic heart failure - echo from 04/15 EF 50-55%, mod LVH -Strict I&O; since admission + 5.7 L -Daily a.m. Weight;  admission weight 65.7 kg > weight today 69.7 kg -TTE 1/3 confirms preserved systolic fxn w/ EF 28-31% w/ findings c/w diastolic CHF -appears to be mildly volume overloaded - resume diuresis - watch weights   Pulmonary Hypertension - PAP 39mmHg from echo 04/15 -trial of afterload reducing agent  Elevated Troponin  -Resolved  Acute renal failure  -Resolved, continue to monitor   Acute blood loss anemia / Anemia of chronic disease + iron deficiency -Supratherapeutic INR resolved -Started Lovenox per pharmacy for A. Fib > resume coumadin  -1/1 received 1 unit PRBC, will continue to monitor closely  Coagulopathy - INR supratherapeutic -Resolved  -See acute blood loss anemia  Acute metabolic encephalopathy -Multifactorial see above -Resolved  Anxiety -Monitor clinically -Hold outpatient xanax, remeron, trazodone.  Insomnia  -Trazodone 25 mg QHS PRN   Code Status: FULL Family Communication: no family present at time of exam Disposition Plan: stable for transfer to med bed - begin PT/OT - work on resp stability   Consultants: Dr.Faera Byerly (surgery) Dr. Kara Mead (PCCM)  Antibiotics: Levaquin 12/28 Zosyn 12/29 >   DVT prophylaxis: SCD/Lovenox  Objective: Blood pressure 116/89, pulse 92, temperature 97.6 F (36.4 C), temperature source Oral, resp. rate 20, height 5\' 6"  (1.676 m), weight 69.7 kg (153 lb 10.6 oz), SpO2 96 %.  Intake/Output Summary (  Last 24 hours) at 03/16/14 1359 Last data filed at 03/16/14 0855  Gross per 24 hour  Intake    250 ml  Output    900 ml  Net   -650 ml   Exam: General: A/O 4, NAD Lungs: distant bs th/o all fields - mild diffuse exp wheeze - no focal crackles  Cardiovascular: Regular rate without murmur gallop or rub normal S1 and S2 Abdomen:  Nontender, distended, soft, bowel sounds positive, no rebound, no ascites, no appreciable mass Extremities: No significant cyanosis, clubbing;  Trace edema bilateral lower extremities  Data Reviewed: Basic Metabolic Panel:  Recent Labs Lab 03/10/14 0759 03/11/14 0240 03/12/14 0304 03/13/14 0259 03/14/14 1525 03/15/14 0230 03/16/14 0322  NA 136 140 143 142 142 142 144  K 5.1 4.8 4.1 3.6 3.5 3.6 3.9  CL 86* 89* 101 103 104 104 106  CO2 33* 38* 35* 30 26 38* 32  GLUCOSE 121* 113* 82 94 96 100* 95  BUN 37* 45* 31* 19 9 7  <5*  CREATININE 2.35* 2.19* 1.39* 0.99 0.79 0.82 0.78  CALCIUM 8.5 8.4 8.2* 8.2* 8.1* 8.1* 8.4  MG 1.9 2.1 2.1 1.8 1.7 1.7 1.5  PHOS 5.7* 5.0* 2.1* 1.4* 1.9*  --   --    Liver Function Tests:  Recent Labs Lab 03/09/14 1533 03/14/14 1525 03/15/14 0230 03/16/14 0322  AST 23 27 20 17   ALT 12 12 13 13   ALKPHOS 96 67 66 64  BILITOT 1.0 0.7 0.4 0.5  PROT 8.3 6.2 6.3 6.2  ALBUMIN 3.3* 2.3* 2.2* 2.3*    Recent Labs Lab 03/09/14 1533  LIPASE 15    Recent Labs Lab 03/09/14 2309  AMMONIA 49*   CBC:  Recent Labs Lab 03/09/14 1533  03/12/14 0304 03/13/14 0259 03/14/14 1525 03/15/14 0230 03/16/14 0322  WBC 8.8  < > 8.6 6.9 8.1 8.8 8.7  NEUTROABS 7.0  --   --   --  6.2 6.8 6.8  HGB 9.1*  < > 7.1* 7.5* 8.5* 8.8* 8.5*  HCT 30.1*  < > 23.5* 25.1* 27.9* 28.4* 28.5*  MCV 92.0  < > 90.4 91.6 90.9 93.4 92.5  PLT 343  < > 283 301 289 276 279  < > = values in this interval not displayed.   Cardiac Enzymes:  Recent Labs Lab 03/09/14 1533 03/09/14 2239 03/10/14 1321  TROPONINI 0.04* 0.04* 0.03   BNP (last 3 results)  Recent Labs  06/16/13 1554 06/17/13 0537 07/09/13 0555  PROBNP 3560.0* 3222.0* 3842.0*    Recent Results (from the past 240 hour(s))  Culture, blood (routine x 2)     Status: None   Collection Time: 03/09/14  3:33 PM  Result Value Ref Range Status   Specimen Description BLOOD RIGHT ARM  Final   Special Requests BOTTLES  DRAWN AEROBIC AND ANAEROBIC 6CC BOTTLES  Final   Culture NO GROWTH 5 DAYS  Final   Report Status 03/14/2014 FINAL  Final  Culture, blood (routine x 2)     Status: None   Collection Time: 03/09/14  5:44 PM  Result Value Ref Range Status   Specimen Description BLOOD RIGHT ARM  Final   Special Requests BOTTLES DRAWN AEROBIC AND ANAEROBIC 10CC BOTTLES  Final   Culture NO GROWTH 5 DAYS  Final   Report Status 03/14/2014 FINAL  Final  MRSA PCR Screening     Status: None   Collection Time: 03/10/14  5:47 AM  Result Value Ref Range Status   MRSA by  PCR NEGATIVE NEGATIVE Final    Comment:        The GeneXpert MRSA Assay (FDA approved for NASAL specimens only), is one component of a comprehensive MRSA colonization surveillance program. It is not intended to diagnose MRSA infection nor to guide or monitor treatment for MRSA infections.      Studies:  Recent x-ray studies have been reviewed in detail by the Attending Physician  Scheduled Meds:  Scheduled Meds: . sodium chloride   Intravenous Once  . antiseptic oral rinse  7 mL Mouth Rinse BID  . budesonide (PULMICORT) nebulizer solution  0.5 mg Nebulization BID  . diltiazem  180 mg Oral Daily  . enoxaparin (LOVENOX) injection  70 mg Subcutaneous Q12H  . ipratropium-albuterol  3 mL Nebulization QID  . montelukast  10 mg Oral QHS  . piperacillin-tazobactam (ZOSYN)  IV  3.375 g Intravenous Q8H  . senna-docusate  1 tablet Oral QHS  . sodium chloride  3 mL Intravenous Q12H  . traZODone  25 mg Oral QHS    Time spent on care of this patient: 35 mins  Cherene Altes, MD [facility] For Consults/Admissions - Flow Manager - [phone] Office  [phone]  Contact MD directly via text page:      [email or web address]      password [facility]  03/16/2014, 1:59 PM   LOS: 7 days

## 2014-03-17 ENCOUNTER — Inpatient Hospital Stay (HOSPITAL_COMMUNITY): Payer: Medicare Other

## 2014-03-17 LAB — BLOOD GAS, ARTERIAL
Acid-Base Excess: 15.6 mmol/L — ABNORMAL HIGH (ref 0.0–2.0)
Bicarbonate: 41.6 mEq/L — ABNORMAL HIGH (ref 20.0–24.0)
Drawn by: 246861
O2 Content: 4 L/min
O2 Saturation: 100 %
PATIENT TEMPERATURE: 98.6
PH ART: 7.374 (ref 7.350–7.450)
TCO2: 43.9 mmol/L (ref 0–100)
pCO2 arterial: 73.1 mmHg (ref 35.0–45.0)
pO2, Arterial: 182 mmHg — ABNORMAL HIGH (ref 80.0–100.0)

## 2014-03-17 LAB — CBC
HCT: 30.7 % — ABNORMAL LOW (ref 39.0–52.0)
Hemoglobin: 9.4 g/dL — ABNORMAL LOW (ref 13.0–17.0)
MCH: 29 pg (ref 26.0–34.0)
MCHC: 30.6 g/dL (ref 30.0–36.0)
MCV: 94.8 fL (ref 78.0–100.0)
Platelets: 309 10*3/uL (ref 150–400)
RBC: 3.24 MIL/uL — AB (ref 4.22–5.81)
RDW: 18.1 % — AB (ref 11.5–15.5)
WBC: 7.9 10*3/uL (ref 4.0–10.5)

## 2014-03-17 LAB — BASIC METABOLIC PANEL
Anion gap: 5 (ref 5–15)
BUN: <5 mg/dL — ABNORMAL LOW (ref 6–23)
CALCIUM: 8.3 mg/dL — AB (ref 8.4–10.5)
CHLORIDE: 95 meq/L — AB (ref 96–112)
CO2: 39 mmol/L — AB (ref 19–32)
Creatinine, Ser: 0.73 mg/dL (ref 0.50–1.35)
GFR calc Af Amer: >90 mL/min (ref >90)
GFR calc non Af Amer: 89 mL/min — ABNORMAL LOW (ref >90)
GLUCOSE: 91 mg/dL (ref 70–99)
Potassium: 3.5 mmol/L (ref 3.5–5.1)
Sodium: 139 mmol/L (ref 135–145)

## 2014-03-17 LAB — PROTIME-INR
INR: 1.68 — AB (ref 0.00–1.49)
Prothrombin Time: 20 seconds — ABNORMAL HIGH (ref 11.6–15.2)

## 2014-03-17 LAB — BRAIN NATRIURETIC PEPTIDE: B NATRIURETIC PEPTIDE 5: 882.9 pg/mL — AB (ref 0.0–100.0)

## 2014-03-17 MED ORDER — DOCUSATE SODIUM 100 MG PO CAPS
100.0000 mg | ORAL_CAPSULE | Freq: Two times a day (BID) | ORAL | Status: DC
  Administered 2014-03-18: 100 mg via ORAL
  Filled 2014-03-17 (×3): qty 1

## 2014-03-17 MED ORDER — POLYETHYLENE GLYCOL 3350 17 G PO PACK
17.0000 g | PACK | Freq: Once | ORAL | Status: AC
  Administered 2014-03-17: 17 g via ORAL
  Filled 2014-03-17: qty 1

## 2014-03-17 MED ORDER — WARFARIN SODIUM 4 MG PO TABS
4.0000 mg | ORAL_TABLET | Freq: Once | ORAL | Status: AC
  Administered 2014-03-17: 4 mg via ORAL
  Filled 2014-03-17: qty 1

## 2014-03-17 MED ORDER — BISACODYL 10 MG RE SUPP
10.0000 mg | Freq: Once | RECTAL | Status: AC
  Administered 2014-03-17: 10 mg via RECTAL
  Filled 2014-03-17: qty 1

## 2014-03-17 NOTE — Progress Notes (Signed)
Pt arrived A/O. VSS, though O2 sats reading mid 80's. On 2L. Increased to 4L. Rechecked and sats were unable to be obtained using dinamap and handheld pulse ox, warm packet, ear, toes...  . Pt is showing no distress. R/T (KiM) called to assist and give a PRN breathing tx. Requested a continuous pulse ox to try to obtain results. Monitoring pt closely.

## 2014-03-17 NOTE — Evaluation (Signed)
Physical Therapy Evaluation Patient Details Name: Theodore [name] MRN: [id number] DOB: 1940-06-21 Today's Date: 03/17/2014   History of Present Illness   74 y.o. M brought to [facility] ED 12/28 for SOB and AMS.  Had recent admission 12/1 - 12/17 for appendiceal abscess.  In ED, found to have SBO, AKI, acute encephalopathy.  Pt transferred to [facility] for further care, PCCM consulted..    Clinical Impression  Pt admitted with/for SOB and AMS.  Pt currently limited functionally due to the problems listed below.  (see problems list.)  Pt will benefit from PT to maximize function and safety to be able to get home safely with available assist of family.     Follow Up Recommendations Home health PT;Supervision/Assistance - 24 hour    Equipment Recommendations  Other (comment) (TBD)    Recommendations for Other Services       Precautions / Restrictions Precautions Precautions: Fall Precaution Comments: watch O2 or pt response to treatment Restrictions Weight Bearing Restrictions: No      Mobility  Bed Mobility Overal bed mobility: Needs Assistance Bed Mobility: Supine to Sit     Supine to sit: Supervision;HOB elevated     General bed mobility comments: no rails needed  Transfers Overall transfer level: Needs assistance Equipment used: None Transfers: Sit to/from Stand Sit to Stand: Min guard         General transfer comment: good safety  Ambulation/Gait Ambulation/Gait assistance: Min guard Ambulation Distance (Feet): 120 Feet Assistive device: None (or iv pole) Gait Pattern/deviations: Step-through pattern;Trunk flexed Gait velocity: decreased   General Gait Details: Mildly unsteady gait overall with period of obvious, but varying dyspnea and some distress.  Unable to get a single O2 sat reading throughout treatment.  Nurse and rapid response prior to session unable to get a reading.  Pt with one episode of ashiness with distress.  Stairs            Wheelchair  Mobility    Modified Rankin (Stroke Patients Only)       Balance Overall balance assessment: Needs assistance Sitting-balance support: Feet supported;No upper extremity supported Sitting balance-Leahy Scale: Good     Standing balance support: No upper extremity supported Standing balance-Leahy Scale: Fair Standing balance comment: but can not accept challenge                             Pertinent Vitals/Pain Pain Assessment: No/denies pain    Home Living Family/patient expects to be discharged to:: Private residence Living Arrangements: Children Available Help at Discharge: Family;Available 24 hours/day Type of Home: House Home Access: Stairs to enter Entrance Stairs-Rails: Psychiatric nurse of Steps: 4 Home Layout: Able to live on main level with bedroom/bathroom Home Equipment: Cane - single point      Prior Function Level of Independence: Independent         Comments: Pt wears supplemental 02 at night and PRN during the day at 2L 02 [place].     Hand Dominance        Extremity/Trunk Assessment   Upper Extremity Assessment: Defer to OT evaluation           Lower Extremity Assessment: Overall WFL for tasks assessed;Generalized weakness         Communication   Communication: No difficulties  Cognition Arousal/Alertness: Lethargic;Awake/alert (lethargic at first) Behavior During Therapy: [facility] for tasks assessed/performed Overall Cognitive Status: Within Functional Limits for tasks assessed  General Comments General comments (skin integrity, edema, etc.): Several noticeable episodes of dyspnea and 1 episode of distress and inablility to talk well.    Exercises        Assessment/Plan    PT Assessment Patient needs continued PT services  PT Diagnosis Difficulty walking;Generalized weakness (decreased activity tolerance)   PT Problem List Decreased strength;Decreased activity  tolerance;Decreased balance;Decreased mobility;Cardiopulmonary status limiting activity  PT Treatment Interventions Gait training;Functional mobility training;Therapeutic activities;Balance training;Patient/family education   PT Goals (Current goals can be found in the Care Plan section) Acute Rehab PT Goals Patient Stated Goal: not stated PT Goal Formulation: With patient Time For Goal Achievement: 03/31/14 Potential to Achieve Goals: Good    Frequency Min 3X/week   Barriers to discharge        Co-evaluation               End of Session   Activity Tolerance: Patient limited by fatigue Patient left: in chair;with call bell/phone within reach;with nursing/sitter in room Nurse Communication: Mobility status         Time: 1218-1249 PT Time Calculation (min) (ACUTE ONLY): 31 min   Charges:   PT Evaluation $Initial PT Evaluation Tier I: 1 Procedure PT Treatments $Gait Training: 8-22 mins $Therapeutic Activity: 8-22 mins   PT G Codes:        Les Longmore, Tessie Fass 03/17/2014, 1:18 PM  03/17/2014  Donnella Sham, PT [phone]  (pager)

## 2014-03-17 NOTE — Evaluation (Addendum)
Occupational Therapy Evaluation Patient Details Name: Theodore [name] MRN: [id number] DOB: [birth year] Today's Date: 03/17/2014    History of Present Illness 74 y.o. year old frail, ill appearing  male with significant past medical history multiple medical problems including chronic resp failure on 2L [place], COPD, CAD, atrial fibrillation on coumadin,  presenting with SBO, encephalopathy AKI, weakness, intermittent atrial fibrillation w/ RVR, mildly elevated troponin. Admitted with AMS.   Clinical Impression   Pt admitted with above. Pt independent with ADLs, PTA. Feel pt will benefit from acute OT to increase independence and activity tolerance prior to d/c. Recommending HHOT upon d/c. Pt on 4 L of O2 when O2 arrived, and when pulse oximeter finally read it was in 70's-80's%. Nurse bumped up O2 to 6L for session until near then end (returned it to 4L of O2). Pulse oximeter would only read one time in session despite multiple attempts.    Follow Up Recommendations  Home health OT;Supervision/Assistance - 24 hour    Equipment Recommendations  3 in 1 bedside comode    Recommendations for Other Services       Precautions / Restrictions Precautions Precautions: Fall Precaution Comments: watch O2  Restrictions Weight Bearing Restrictions: No      Mobility Bed Mobility               General bed mobility comments: not assessed  Transfers Overall transfer level: Needs assistance Equipment used: None Transfers: Sit to/from Stand Sit to Stand: Min guard         General transfer comment: Min guard for safety    Balance                                            ADL Overall ADL's : Needs assistance/impaired     Grooming: Wash/dry face;Oral care;Applying deodorant;Wash/dry hands;Set up;Supervision/safety;Standing (applied lotion)   Upper Body Bathing: Set up;Supervision/ safety;Standing   Lower Body Bathing: Min guard;Sit to/from stand   Upper Body  Dressing : Set up;Supervision/safety;Sitting   Lower Body Dressing: Minimal assistance;Sit to/from stand   Toilet Transfer: Minimal assistance;Ambulation;Regular Museum/gallery exhibitions officer and Hygiene: Min guard;Sit to/from stand       Functional mobility during ADLs: Minimal assistance General ADL Comments: Educated on energy conservation techniques. educated on safety such as safe shoewear and recommended sitting for LB bathing. Discussed using 3 in 1 as shower chair. Briefly discussed tub transfer.  Pt performed some ADL tasks at sink. Educated on deep breathing technique.     Vision  Pt wears glasses for reading and driving.                   Perception     Praxis      Pertinent Vitals/Pain Pain Assessment: No/denies pain     Hand Dominance     Extremity/Trunk Assessment Upper Extremity Assessment Upper Extremity Assessment: Generalized weakness   Lower Extremity Assessment Lower Extremity Assessment: Defer to PT evaluation       Communication Communication Communication: No difficulties   Cognition Arousal/Alertness: Awake/alert Behavior During Therapy: WFL for tasks assessed/performed Overall Cognitive Status: No family/caregiver present to determine baseline cognitive functioning                     General Comments       Exercises       Shoulder Instructions  Home Living Family/patient expects to be discharged to:: Private residence Living Arrangements: Children Available Help at Discharge: Family;Available 24 hours/day Type of Home: House Home Access: Stairs to enter [employer] of Steps: 4 Entrance Stairs-Rails: Right;Left Home Layout: Able to live on main level with bedroom/bathroom     Bathroom Shower/Tub: Teacher, early years/pre: Standard     Home Equipment: Cane - single point          Prior Functioning/Environment Level of Independence: Independent             OT  Diagnosis: Generalized weakness   OT Problem List: Decreased strength;Decreased activity tolerance;Cardiopulmonary status limiting activity;Decreased knowledge of precautions;Decreased knowledge of use of DME or AE;Impaired balance (sitting and/or standing)   OT Treatment/Interventions: Self-care/ADL training;Therapeutic exercise;Energy conservation;DME and/or AE instruction;Therapeutic activities;Patient/family education;Balance training    OT Goals(Current goals can be found in the care plan section) Acute Rehab OT Goals Patient Stated Goal: not stated OT Goal Formulation: With patient Time For Goal Achievement: 03/24/14 Potential to Achieve Goals: Good ADL Goals Pt Will Perform Upper Body Bathing: with set-up;standing;sitting Pt Will Perform Lower Body Bathing: with set-up;sit to/from stand Pt Will Perform Lower Body Dressing: with set-up;sit to/from stand Pt Will Transfer to Toilet: with supervision;ambulating Pt Will Perform Toileting - Clothing Manipulation and hygiene: with modified independence;sit to/from stand  OT Frequency: Min 2X/week   Barriers to D/C:            Co-evaluation              End of Session Equipment Utilized During Treatment: Gait belt;Oxygen Nurse Communication: Other (comment) (O2 sats)  Activity Tolerance: Patient tolerated treatment well Patient left: in chair;with call bell/phone within reach   Time: 1002-1038 OT Time Calculation (min): 36 min (increased time spent trying to get pulse oximeter to read O2) Charges:  OT General Charges $OT Visit: 1 Procedure OT Evaluation $Initial OT Evaluation Tier I: 1 Procedure OT Treatments $Self Care/Home Management : 8-22 mins G-CodesBenito Mccreedy  OTR/L [phone]  03/17/2014, 1:07 PM

## 2014-03-17 NOTE — Progress Notes (Signed)
[place] for Lovenox and Coumadin Indication: atrial fibrillation  Allergies  Allergen Reactions  . Aspirin Other (See Comments)    On coumadin    Patient Measurements: Height: 5\' 6"  (167.6 cm) Weight: 143 lb 15.4 oz (65.3 kg) IBW/kg (Calculated) : 63.8 Heparin Dosing Weight:   Vital Signs: Temp: 98.2 F (36.8 C) (01/05 0504) Temp Source: Oral (01/05 0504) BP: 94/71 mmHg (01/05 1223) Pulse Rate: 40 (01/05 0504)  Labs:  Recent Labs  03/15/14 0230 03/16/14 0322 03/17/14 0026  HGB 8.8* 8.5* 9.4*  HCT 28.4* 28.5* 30.7*  PLT 276 279 309  LABPROT 21.7* 18.5* 20.0*  INR 1.87* 1.52* 1.68*  CREATININE 0.82 0.78 0.73    Estimated Creatinine Clearance: 73.1 mL/min (by C-G formula based on Cr of 0.73).  Assessment: 74yo male admitted with supratherapeutic INR on home dose of Coumadin of 4mg  daily x 6mg  on Mon/Thur.  He has been on full dose Lovenox since 1/1, have contacted MD and OK to resume Coumadin as well.  Do not see any probable cause for increased INR on med rec (e.g. Antibiotics), however expect pt with decreased PO intake given abdominal complaints on admission and expect this may have contributed.  Received Vitamin K on 12/30   INR 1.68 this AM, CBC stable  Goal of Therapy:  INR 2-3 Monitor platelets by anticoagulation protocol: Yes   Plan:  1-  Repeat Coumadin 4mg  2-  Daily INR 3-  Continue Lovenox 70mg  SQ q12  Thank you. Anette Guarneri, PharmD [phone]

## 2014-03-17 NOTE — Progress Notes (Signed)
[facility] PROGRESS NOTE  Theodore [name] ZOX:[id number] DOB: 01-12-1941 DOA: 03/09/2014 PCP: Tula Nakayama, MD  Assessment/Plan: Acute on Chronic Hypoxemic and Hypercarbic Respiratory Failure -Multifactorial due to COPD exacerbation, sepsis secondary to appendiceal abscess and SBO. -1/5- w/ episode of inc O2 demands- Placed on 4L [place] (on 2L [place] chronically).  Pt is alert and oriented X3 in NAD, will continue to monitor  - Chest x-ray with improved CHF, persistent bibasilar effusions and atelectasis - ABG reviewed, 7.3/73.1/182, likely has chronic CO2 retention, bicarbonate within normal limits - Patient reexamined, alert awake and oriented, no need of BiPAP unless lethargic or confused or CO2 rising - BNP pending   Sepsis secondary to SBO/appendiceal abscess -Recent hospitalization 12/1-12/7 for appendiceal abscess, had drain placed and removed 12/13. Presented with lack of BM, abd distension, and nausea.   -1/5- with abd distension- KUB with gaseous distention otherwise no obstruction, placed on bowel regimen -Has well tolerated a regular diet with no N/V/ abd pain -CT abd 12/28-with questionable SBO, no free air -Continue Zosyn (day 8)- per ID will continue abx for 14 days -continue Sen[place]ot, Dulcolax daily  -Ambulate patient   Chronic A fib with intermitten RVR -Rate controlled -On chronic Coumadin and Cardizem -INR sub-therapeutic 1.68-continue Cardizem 180mg  daily, Coumadin and Lovenox per pharmacy -Monitor INR daily  Acute on chronic diastolic heart failure -Lungs clear, 1+ LE edema  -2D echo 04/15- EF 50-55%; mod LVH -Continue Torsemide 40mg  daily. -03/17/13-  Neg 2 L overnight, + 3 L since admission -Repeat BNP pending -Continue Strict I&O, Daily weight  COPD -Lungs with good air movement, no wheeze -Continue Symbicort, Spiriva inhaler, Xopenex duoneb, and oral Singulair   HTN -BP stable -Continue Cardiazem 180mg  daily, Torsemide 40mg  daily  Elevated  troponin -Denies any chest pain -Resolved  Acute renal failure -Creatinine 0.73 -Repeat BMET in  morning  Acute blood loss anemia/ Anemia of chronic disease/ Iron deficiency anemia -Hgb  Stable at 9.4- no signs of bleeding -S/p 1 unit [facility] 03/13/13- with episode of supratheraputic INR of 7.71, and Hgb 7.1 -Repeat CBC in am  Coagulopathy -INR sub-therapeutic at 1.63  Pulmonary hypertension -2D echo 14/15- PAP 84 mmHg  Acute metabolic encephalopathy -Resolved, A & OX3 -Ct head 12/28 with no abnormalities  Anxiety -Stable, no complaints. Continue Remeron  Insomnia -Resume Trazodone 25 mg daily at bedtime when necessary  Inguinal adenopathy -CT abd/pelvis- Rt 1.3 cm inguinal adenopathy, similar to prior    DVT Prophylaxis: on Coumadin, Lovenox Code Status: Full Family Communication: No family at bedside Disposition Plan: Inpatient   Consultants:  Surgery:    Procedures:  None  Antibiotics:  Zosyn 03/10/14 >>>   HPI/Subjective: Theodore [name] is a 74 yo male with PMH of Atrial fibrillation on , chronic diastolic CHF (EF  ), pulmonary hypertention, COPD (on 2L O2 [place]), hypertension, anxiety, pulmonary hypertension, lung nodules that presented to the ED on 12/28 with complaints of shortness of breath, nausea, and no BM. Patient was recently admitted to [facility] 12/1-12/7 for appendiceal abscess and had a drain placed by IR which was removed on 12/13.  Patient was seen one day prior to presentation by his PCP Ricard Dillon felt to be gradually improving.  In the ED, pt with acute encephalopathy, AKI, intermitten AF with RVR, mildly elevated troponin, and CT ab/pelvis with evidence concerning for SBO.     No complaints- denies sob, n/v/ or abd pain. Has well tolerated a regular diet.  Has ambulated and has had BM  Objective:  Filed Vitals:   03/17/14 0504  BP: 131/92  Pulse: 40  Temp: 98.2 F (36.8 C)  Resp: 20    Intake/Output Summary (Last 24 hours) at 03/17/14  1042 Last data filed at 03/17/14 0918  Gross per 24 hour  Intake    120 ml  Output   2427 ml  Net  -2307 ml   Filed Weights   03/15/14 0320 03/16/14 0327 03/17/14 0312  Weight: 68.3 kg (150 lb 9.2 oz) 69.7 kg (153 lb 10.6 oz) 65.3 kg (143 lb 15.4 oz)    Exam:  Gen: Alert AA male in NAD. Sitting comfortably in chair.  On O2 [place].  Chest: clear to auscultate bilaterally, no ronchi or rales  Cardiac: Regular rate and rhythm, S1-S2, no rubs murmurs or gallops  Abdomen: distended, soft, non tender, +bowel sounds. No guarding or rigidity  Extremities: Symmetrical in appearance without cyanosis. 1+ LE edema Neurological: Alert awake oriented to time place and person.    Data Reviewed: Basic Metabolic Panel:  Recent Labs Lab 03/11/14 0240 03/12/14 0304 03/13/14 0259 03/14/14 1525 03/15/14 0230 03/16/14 0322 03/17/14 0026  NA 140 143 142 142 142 144 139  K 4.8 4.1 3.6 3.5 3.6 3.9 3.5  CL 89* 101 103 104 104 106 95*  CO2 38* 35* 30 26 38* 32 39*  GLUCOSE 113* 82 94 96 100* 95 91  BUN 45* 31* 19 9 7  <5* <5*  CREATININE 2.19* 1.39* 0.99 0.79 0.82 0.78 0.73  CALCIUM 8.4 8.2* 8.2* 8.1* 8.1* 8.4 8.3*  MG 2.1 2.1 1.8 1.7 1.7 1.5  --   PHOS 5.0* 2.1* 1.4* 1.9*  --   --   --    Liver Function Tests:  Recent Labs Lab 03/14/14 1525 03/15/14 0230 03/16/14 0322  AST 27 20 17   ALT 12 13 13   ALKPHOS 67 66 64  BILITOT 0.7 0.4 0.5  PROT 6.2 6.3 6.2  ALBUMIN 2.3* 2.2* 2.3*   No results for input(s): LIPASE, AMYLASE in the last 168 hours. No results for input(s): AMMONIA in the last 168 hours. CBC:  Recent Labs Lab 03/13/14 0259 03/14/14 1525 03/15/14 0230 03/16/14 0322 03/17/14 0026  WBC 6.9 8.1 8.8 8.7 7.9  NEUTROABS  --  6.2 6.8 6.8  --   HGB 7.5* 8.5* 8.8* 8.5* 9.4*  HCT 25.1* 27.9* 28.4* 28.5* 30.7*  MCV 91.6 90.9 93.4 92.5 94.8  PLT 301 289 276 279 309   Cardiac Enzymes:  Recent Labs Lab 03/10/14 1321  TROPONINI 0.03   BNP (last 3 results)  Recent Labs   06/16/13 1554 06/17/13 0537 07/09/13 0555  PROBNP 3560.0* 3222.0* 3842.0*   CBG: No results for input(s): GLUCAP in the last 168 hours.  Recent Results (from the past 240 hour(s))  Culture, blood (routine x 2)     Status: None   Collection Time: 03/09/14  3:33 PM  Result Value Ref Range Status   Specimen Description BLOOD RIGHT ARM  Final   Special Requests BOTTLES DRAWN AEROBIC AND ANAEROBIC 6CC BOTTLES  Final   Culture NO GROWTH 5 DAYS  Final   Report Status 03/14/2014 FINAL  Final  Culture, blood (routine x 2)     Status: None   Collection Time: 03/09/14  5:44 PM  Result Value Ref Range Status   Specimen Description BLOOD RIGHT ARM  Final   Special Requests BOTTLES DRAWN AEROBIC AND ANAEROBIC 10CC BOTTLES  Final   Culture NO GROWTH 5 DAYS  Final   Report  Status 03/14/2014 FINAL  Final  MRSA PCR Screening     Status: None   Collection Time: 03/10/14  5:47 AM  Result Value Ref Range Status   MRSA by PCR NEGATIVE NEGATIVE Final    Comment:        The GeneXpert MRSA Assay (FDA approved for NASAL specimens only), is one component of a comprehensive MRSA colonization surveillance program. It is not intended to diagnose MRSA infection nor to guide or monitor treatment for MRSA infections.      Studies: No results found.  Scheduled Meds: . antiseptic oral rinse  7 mL Mouth Rinse BID  . brimonidine  1 drop Both Eyes BID   And  . timolol  1 drop Both Eyes BID  . budesonide-formoterol  2 puff Inhalation BID  . diltiazem  180 mg Oral Daily  . enoxaparin (LOVENOX) injection  70 mg Subcutaneous Q12H  . latanoprost  1 drop Both Eyes QHS  . mirtazapine  15 mg Oral QHS  . montelukast  10 mg Oral QHS  . piperacillin-tazobactam (ZOSYN)  IV  3.375 g Intravenous Q8H  . senna-docusate  1 tablet Oral QHS  . sodium chloride  3 mL Intravenous Q12H  . tiotropium  18 mcg Inhalation Daily  . torsemide  40 mg Oral Daily  . traZODone  25 mg Oral QHS  . warfarin  1 each Does not  apply Once  . Warfarin - Pharmacist Dosing Inpatient   Does not apply q1800   Continuous Infusions:   Active Problems:   SBO (small bowel obstruction)   AKI (acute kidney injury)   Encephalopathy   Acute respiratory failure with hypoxia and hypercarbia   Abdominal distention   Acute on chronic respiratory failure with hypoxia   Atrial fibrillation, chronic   Acute on chronic diastolic CHF (congestive heart failure)   Pulmonary hypertension   Elevated troponin   Acute renal failure syndrome   Anemia of chronic disease   Coagulopathy   Supratherapeutic INR   Metabolic encephalopathy   Anxiety state   Appendiceal abscess    Time spent: [place], [facility]  [facility] Pager [phone]. If 7PM-7AM, please contact night-coverage at [email or web address], password [email or web address] 03/17/2014, 10:42 AM  LOS: 8 days      I have personally examined the patient and reviewed the entire database. Agree with the above note and plan as outlined, any necessary changes made in italics. We will monitor closely for any respiratory deterioration, if continues to improve, ambulate, PTOT evaluation, hopefully DC tomorrow   Eyanna Mcgonagle M.D. [facility] 03/17/2014, 1:24 PM Pager: [phone]  If 7PM-7AM, please contact night-coverage [email or web address] Password [email or web address]

## 2014-03-18 LAB — BASIC METABOLIC PANEL
Anion gap: 11 (ref 5–15)
BUN: 6 mg/dL (ref 6–23)
CO2: 34 mmol/L — ABNORMAL HIGH (ref 19–32)
CREATININE: 0.81 mg/dL (ref 0.50–1.35)
Calcium: 8.2 mg/dL — ABNORMAL LOW (ref 8.4–10.5)
Chloride: 98 mEq/L (ref 96–112)
GFR calc Af Amer: >90 mL/min (ref >90)
GFR calc non Af Amer: 85 mL/min — ABNORMAL LOW (ref >90)
Glucose, Bld: 72 mg/dL (ref 70–99)
POTASSIUM: 3.7 mmol/L (ref 3.5–5.1)
Sodium: 143 mmol/L (ref 135–145)

## 2014-03-18 LAB — CBC
HCT: 28.6 % — ABNORMAL LOW (ref 39.0–52.0)
Hemoglobin: 8.9 g/dL — ABNORMAL LOW (ref 13.0–17.0)
MCH: 29.3 pg (ref 26.0–34.0)
MCHC: 31.1 g/dL (ref 30.0–36.0)
MCV: 94.1 fL (ref 78.0–100.0)
PLATELETS: 291 10*3/uL (ref 150–400)
RBC: 3.04 MIL/uL — ABNORMAL LOW (ref 4.22–5.81)
RDW: 18.3 % — AB (ref 11.5–15.5)
WBC: 7.4 10*3/uL (ref 4.0–10.5)

## 2014-03-18 LAB — PROTIME-INR
INR: 1.74 — ABNORMAL HIGH (ref 0.00–1.49)
Prothrombin Time: 20.5 seconds — ABNORMAL HIGH (ref 11.6–15.2)

## 2014-03-18 MED ORDER — AMOXICILLIN-POT CLAVULANATE 875-125 MG PO TABS: 1.0000 | ORAL_TABLET | Freq: Two times a day (BID) | ORAL | Status: DC

## 2014-03-18 MED ORDER — DSS 100 MG PO CAPS: 100.0000 mg | ORAL_CAPSULE | Freq: Two times a day (BID) | ORAL | Status: AC

## 2014-03-18 NOTE — Progress Notes (Signed)
DC HOME WITH SISTER(MARIE). PT VERBALLY UNDERSTOOD DC INSTRUCTIONS, NO QUESTIONS ASKED

## 2014-03-18 NOTE — Care Management Note (Signed)
    Page 1 of 2   03/18/2014     11:31:26 AM CARE MANAGEMENT NOTE 03/18/2014  Patient:  PERSHING, SKIDMORE   Account Number:  [id number]  Date Initiated:  03/11/2014  Documentation initiated by:  [facility]  Subjective/Objective Assessment:   Readmitted with AMS - SOB.  Found to have ARF, SBO.     Action/Plan:   Anticipated DC Date:  03/18/2014   Anticipated DC Plan:  [place]  CM consult      [facility] Choice  Resumption Of Svcs/PTA Provider   Choice offered to / List presented to:  C-1 Patient        [place] arranged  HH-1 RN  [place].   Status of service:  Completed, signed off Medicare Important Message given?  YES (If response is "NO", the following Medicare IM given date fields will be blank) Date Medicare IM given:  03/12/2014 Medicare IM given by:  MAYO,HENRIETTA Date Additional Medicare IM given:  03/16/2014 Additional Medicare IM given by:  Marvetta Gibbons  Discharge Disposition:  [place]  Per UR Regulation:  Reviewed for med. necessity/level of care/duration of stay  If discussed at [place] of Stay Meetings, dates discussed:   03/17/2014    Comments:  03/18/14 [place], BSN [phone]  NCM spoke with patient, he states he has [place] which he has no problems getting his medications, he states his sister will transport him home today. NCM notified [facility] of dc today.  03-11-14 9:10am Luz Lex, [place] [phone] Lives at home with 2 sons.  Followed by [facility] - Active on last discharge with [facility] for RN and PT.  [facility] questionable about support at home.  Apparently on edge on last discharge between SNF for rehab and home.  Feel may need SNF prior to discharge this admission.  Talked with patient - awake alert, with NG tube.  States gets along fine at home with sons, discussed rehab prior to going home - refused at this point.  CM will continue to  follow.

## 2014-03-18 NOTE — Discharge Summary (Signed)
Physician Discharge Summary  Theodore [name]:[id number] DOB: 12-May-1940 DOA: 03/09/2014  PCP: Tula Nakayama, MD  Admit date: 03/09/2014 Discharge date: 03/18/2014  Time spent: 50 minutes  Recommendations for Outpatient Follow-up:  1. Follow up with PCP for INR recheck in 2 days, and CBC for reassessment of Hgb.  2. Discharge home with home health PT/OT 3.          Will arrange home health PT, OT, RN for follow-up.  Discharge Diagnoses:  Active Problems:   SBO (small bowel obstruction)   AKI (acute kidney injury)   Encephalopathy   Acute respiratory failure with hypoxia and hypercarbia   Abdominal distention   Acute on chronic respiratory failure with hypoxia   Atrial fibrillation, chronic   Acute on chronic diastolic CHF (congestive heart failure)   Pulmonary hypertension   Elevated troponin   Acute renal failure syndrome   Anemia of chronic disease   Coagulopathy   Supratherapeutic INR   Metabolic encephalopathy   Anxiety state   Appendiceal abscess   Discharge Condition: stable  Diet recommendation: Regular diet  Filed Weights   03/15/14 0320 03/16/14 0327 03/17/14 0312  Weight: 68.3 kg (150 lb 9.2 oz) 69.7 kg (153 lb 10.6 oz) 65.3 kg (143 lb 15.4 oz)    History of present illness:  Acute on chronic hypoxemic and hypercarbic respiratory failure -He then presented with altered mental status, lack of bowel movements, abdominal pain, and increased oxygen demands.  In the ED, he was found septic secondary to SBO/appendiceal abscess.  He was initially placed on BiPAP for his respiratory status, placed on IV antibiotics, made NPO,  and NG tube was placed for SBO. Patient was then weaned off BiPAP to nasal cannula, with improvement in respiratory status.  He responded well to NGT placement and had bowel movements.  NGT was discontinued, and diet was advanced as tolerated. -On discharge, he was A&O X3, was back to his home O2 requirements of 2 L [place], and  tolerated  regular diet with no N/V/abd pain.  -On discharge, take Augmentin two times daily for 1 week.  Take senekot 8.6 BID  -PT/OT with recommendations for home health PT/OT  Sepsis secondary to small bowel obstruction/appendiceal abscess -Patient with a recent admission for appendiceal abscess in which drain was placed and removed prior to discharge. Ct abd/pelvis with concerns for SBO, with no free air. He was placed on IV Zosyn for 9 days, and SBO resolved with NGT tube, thus not requiring surgery.  He then was placed on a bowel regimen, and had many bowel movements before discharge.  Repeat KUB 1/5- with gaseous distension, and no evidence of obstruction. -Continue antibiotic for 5 days.  Chronic A. Fib with intermittent RVR -HR is controlled on discharge.  Due to subtherapeutic INR, he was placed on Cardizem 180 mg daily, Coumadin, and Lovenox. -On discharge, continue Coumadin,  Cardizem 180 mg daily  Acute on chronic diastolic heart failure -Initially, patient presented with acute kidney injury, and diuretic was held.  With improvement in kidney function, diuretic was restarted.  On discharge, lungs clear to auscultate bilaterally, 1+ LE edema.  2D echo 04/15- EF 50-55% with mod LVH -On discharge, continue Torsemide 40 mg BID.  Acute renal failure -Patient was well hydrated with IV fluids, and on discharge, creatinine stable at 0.81.    Acute blood loss anemia/Anemia of chronic disease/ iron deficiency anemia -Hgb stable at 8.9 on discharge.  Patient received, 1 unit BRBC on 03/13/13 due to supratherapeutic  INR and Hgb 7.1 -Continue iron supplements daily -Follow up with PCP for CBC to reassess Hgb  Coagulopathy -INR 1.74 on discharge.  Follow up with PCP for INR recheck in 2days.  Pulmonary hypertension -2D echo 14/15 PAP 84 mmHg.   -Follow up with PCP for further management  Acute metabolic encephalopathy -Presented with altered mental status, CT head 12/28 with no abnormalities.   Patient is alert and oriented X3 on discharge  Anxiety -Stable, no complaints.  -Continue Xanax 0.25 MG as needed  Gout -no complaints, continue allopurinol 300 mg daily  COPD -Lungs are clear to auscultate bilaterally, no wheeze -Continue Singulair 10 mg daily; Symbicort and Spiriva inhalers when necessary  Insomnia -Continue Trazadone 25-50 mg daily QHS PRN  Inguinal adenopathy. -CT abd/Pelvis- Rt 1.3 cm inguinal adenopathy, similar to prior -Follow up with PCP for further management and reassessment   Hospital Course:  Theodore [name] is a 74 yo male with PMH of Atrial fibrillation on , chronic diastolic CHF (EF ), pulmonary hypertention, COPD (on 2L O2 [place]), hypertension, anxiety, pulmonary hypertension, lung nodules that presented to the ED on 12/28 with complaints of shortness of breath, nausea, and no BM. Patient was recently admitted to [facility] 12/1-12/7 for appendiceal abscess and had a drain placed by IR which was removed on 12/13. Patient was seen one day prior to presentation by his PCP Theodore [name] felt to be gradually improving. In the ED, pt with acute encephalopathy, AKI, intermitten AF with RVR, mildly elevated troponin, and CT ab/pelvis with evidence concerning for SBO.    Procedures:  NGT  Consultations:  Critical Care  Surgery   Discharge Exam: Filed Vitals:   03/18/14 0650  BP: 129/66  Pulse: 102  Temp: 98.4 F (36.9 C)  Resp: 18     Exam General: Alert AA male in NAD. On 2 L [place] Cardiovascular: Regular rate and rhythm.  No murmurs, rubs, or gallops. Respiratory: Clear to auscultate bilaterally.  No rhonchi or crepitations. Abdomen: Soft nontender bowel sounds present. Mild distension. No guarding or rigidity.  Musculoskeletal: 1+ edema.  Neurologic: Alert awake oriented to time place and person.    Discharge Instructions     Discharge Instructions    Diet - low sodium heart healthy    Complete by:  As directed      Increase activity  slowly    Complete by:  As directed             Medication List    STOP taking these medications        enoxaparin 60 MG/0.6ML injection  Commonly known as:  LOVENOX      TAKE these medications        albuterol (2.5 MG/3ML) 0.083% nebulizer solution  Commonly known as:  PROVENTIL  Take 2.5 mg by nebulization every 8 (eight) hours as needed for wheezing or shortness of breath.     albuterol 108 (90 BASE) MCG/ACT inhaler  Commonly known as:  PROAIR HFA  Inhale 2 puffs into the lungs every 6 (six) hours as needed for wheezing or shortness of breath.     allopurinol 300 MG tablet  Commonly known as:  ZYLOPRIM  GIVE "Dozier" 1 TABLET BY MOUTH EVERY DAILY     ALPRAZolam 0.25 MG tablet  Commonly known as:  XANAX  Take 0.25 mg by mouth daily as needed for anxiety.     amoxicillin-clavulanate 875-125 MG per tablet  Commonly known as:  AUGMENTIN  Take 1 tablet by mouth 2 (  two) times daily. X 1 week     brimonidine-timolol 0.2-0.5 % ophthalmic solution  Commonly known as:  COMBIGAN  Place 1 drop into both eyes every 12 (twelve) hours.     budesonide-formoterol 160-4.5 MCG/ACT inhaler  Commonly known as:  SYMBICORT  Inhale 2 puffs into the lungs 2 (two) times daily.     diltiazem 180 MG 24 hr capsule  Commonly known as:  CARDIZEM CD  Take 1 capsule (180 mg total) by mouth daily.     DSS 100 MG Caps  Take 100 mg by mouth 2 (two) times daily.     ferrous sulfate 325 (65 FE) MG tablet  Take 1 tablet (325 mg total) by mouth 2 (two) times daily with a meal.     mirtazapine 15 MG tablet  Commonly known as:  REMERON  Take 1 tablet (15 mg total) by mouth at bedtime.     montelukast 10 MG tablet  Commonly known as:  SINGULAIR  Take 10 mg by mouth at bedtime.     potassium chloride 10 MEQ tablet  Commonly known as:  K-DUR  GIVE "Dmarion" 1 TABLET BY MOUTH TWICE DAILY     senna 8.6 MG Tabs tablet  Commonly known as:  SENOKOT  Take 1 tablet (8.6 mg total) by mouth 2 (two)  times daily.     tiotropium 18 MCG inhalation capsule  Commonly known as:  SPIRIVA  Place 1 capsule (18 mcg total) into inhaler and inhale daily.     torsemide 20 MG tablet  Commonly known as:  DEMADEX  Take 2 tablets (40 mg total) by mouth daily.     traMADol 50 MG tablet  Commonly known as:  ULTRAM  Take 50 mg by mouth daily as needed (pain).     traMADol 50 MG tablet  Commonly known as:  ULTRAM  TAKE 1 TABLET BY MOUTH EVERY DAY AS NEEDED     travoprost (benzalkonium) 0.004 % ophthalmic solution  Commonly known as:  TRAVATAN  Place 1 drop into both eyes at bedtime.     traZODone 50 MG tablet  Commonly known as:  DESYREL  Take 0.5-1 tablets (25-50 mg total) by mouth at bedtime as needed for sleep.     warfarin 4 MG tablet  Commonly known as:  COUMADIN  Take 1.5 tabs (6 mg) on Monday and Thursday and 1 tab (4mg ) on other days       Allergies  Allergen Reactions  . Aspirin Other (See Comments)    On coumadin   Follow-up Information    Follow up with Tula Nakayama, MD. Schedule an appointment as soon as possible for a visit in 10 days.   Specialty:  Family Medicine   Why:  for hospital follow-up, obtain labs, for BMET, CBC and INR/PT   Contact information:   [address] [place] [address] [phone]        The results of significant diagnostics from this hospitalization (including imaging, microbiology, ancillary and laboratory) are listed below for reference.    Significant Diagnostic Studies: Ct Abdomen Pelvis Wo Contrast  03/09/2014   CLINICAL DATA:  Initial evaluation for abdominal distension and nausea. No bowel movement and 4 days. Recent perforated appendix.  EXAM: CT ABDOMEN AND PELVIS WITHOUT CONTRAST  TECHNIQUE: Multidetector CT imaging of the abdomen and pelvis was performed following the standard protocol without IV contrast.  COMPARISON:  Prior radiograph from earlier the same day as well as prior CT from 02/20/2014.  FINDINGS:  Small layering bilateral pleural effusions with associated bibasilar opacity present, which may reflect atelectasis or consolidation. There is prominent cardiomegaly. Scattered coronary artery and mitral valvular calcifications present. No pericardial effusion.  Limited noncontrast evaluation of the liver is within normal limits. Gallbladder are mildly distended without associated inflammatory changes. No biliary dilatation. Spleen, adrenal glands, and pancreas within normal limits.  Kidneys within normal limits without nephrolithiasis or hydronephrosis.  Stomach is markedly distended with air contrast level present 6 cm in diameter with scattered internal air-fluid levels. There is an apparent transition point within knee lower right abdomen (series 2, image 57). Findings compatible with acute small bowel obstruction. The small bowel is fluid filled distally but of normal caliber. The colon is decompressed. Moderate amount of retained stool within the ascending and proximal transverse colon. No free intraperitoneal air. Small volume ascites present adjacent to the liver.  Previously seen percutaneous pigtail drain has been removed from the right lower quadrant.  Bladder decompressed with a Foley catheter in place. Prostate normal.  Right inguinal lymph nodes measuring up to 1.3 cm in short axis noted, similar to prior, and may be reactive in nature. No other pathologically enlarged intra-abdominal pelvic lymph nodes identified. Present Moderate atheromatous plaque present within the intra-abdominal aorta and its branch vessels. No aneurysm.  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 No acute osseus abnormality. No worrisome lytic or blastic osseous lesions.  IMPRESSION: 1. Multiple prominent dilated loops of proximal and mid small bowel within the upper and mid abdomen, markedly increased in caliber relative to recent CT from 02/24/2014. There is a suspected transition point in  the right lower quadrant, concerning for mechanical small bowel obstruction. No free intraperitoneal air. 2. Small volume free perihepatic fluid. 3. Small bilateral pleural effusions with associated bibasilar atelectasis/consolidation. 4. Right inguinal adenopathy measuring up to 1.3 cm in short axis, similar to prior. These nodes are suspected to be reactive in nature. 5. Moderate cardiomegaly.   Electronically Signed   By: Jeannine Boga M.D.   On: 03/09/2014 21:16   Ct Head Wo Contrast  03/10/2014   CLINICAL DATA:  Encephalopathy  EXAM: CT HEAD WITHOUT CONTRAST  TECHNIQUE: Contiguous axial images were obtained from the base of the skull through the vertex without intravenous contrast.  COMPARISON:  10/03/2013  FINDINGS: Motion degraded images.  No evidence of parenchymal hemorrhage or extra-axial fluid collection. No mass lesion, mass effect, or midline shift.  No CT evidence of acute infarction.  Subcortical white matter and periventricular small vessel ischemic changes. Intracranial atherosclerosis.  Age related atrophy.  No ventriculomegaly.  The visualized paranasal sinuses are essentially clear. The mastoid air cells are unopacified.  No evidence of calvarial fracture.  IMPRESSION: Motion degraded images.  No evidence of acute intracranial abnormality.  Atrophy with small vessel ischemic changes and intracranial atherosclerosis.   Electronically Signed   By: Julian Hy M.D.   On: 03/10/2014 00:10   Ct Abdomen Pelvis W Contrast  02/20/2014   CLINICAL DATA:  Ruptured appendicitis  EXAM: CT ABDOMEN AND PELVIS WITH CONTRAST  TECHNIQUE: Multidetector CT imaging of the abdomen and pelvis was performed using the standard protocol following bolus administration of intravenous contrast.  CONTRAST:  167mL OMNIPAQUE IOHEXOL 300 MG/ML  SOLN  COMPARISON:  02/15/2014  FINDINGS: Lower chest: Moderate cardiac enlargement. Moderate bilateral pleural effusions are identified. Atelectasis is noted within  both lower lobes.  Hepatobiliary: Reflux of contrast material from the right side of heart into the  hepatic veins identified compatible with passive venous congestion of the liver. No focal liver abnormality noted. Gallbladder is negative. No biliary dilatation.  Pancreas: Appears normal.  Spleen: Normal.  Adrenals/Urinary Tract: The adrenal glands are both normal. Unremarkable appearance of the kidneys an urinary bladder.  Stomach/Bowel: The stomach appears normal. There is an NG tube within the body of stomach. The small bowel loops are mildly increased in caliber measuring up to 3.7 cm. There is enteric contrast material throughout the small and large bowel loops up to the level of the rectum. Improvement in distal small bowel wall thickening. Right lower quadrant pigtail drainage catheter is identified. There has been resolution of the periappendiceal abscess. No new fluid collections identified.  Vascular/Lymphatic: Calcified atherosclerotic disease involves the abdominal aorta. No aneurysm. No enlarged retroperitoneal or mesenteric adenopathy. No enlarged pelvic or inguinal lymph nodes.  Reproductive: Prostate gland and seminal vesicles appear normal.  Other: Moderate abdominal and pelvic ascites identified.  Musculoskeletal: Multi level degenerative disc disease noted throughout the lower lumbar spine. No aggressive lytic or sclerotic bone lesions identified. There are bilateral L5 pars defects noted.  IMPRESSION: 1. Resolution of right lower quadrant abscess status post drainage placement. 2. Moderate abdominal and pelvic ascites. There is also moderate bilateral pleural effusions and reflux of contrast material from the right atrium into the hepatic veins. Findings are concerning for moderate right heart failure. 3. Suspect continued small bowel enteritis with mild increase caliber of small bowel loops without evidence for bowel obstruction.   Electronically Signed   By: Kerby Moors M.D.   On: 02/20/2014  14:10   Dg Chest Port 1 View  03/17/2014   CLINICAL DATA:  Shortness of breath today, abdominal discomfort, ileus  EXAM: PORTABLE CHEST - 1 VIEW  COMPARISON:  Portable exam 1103 hr compared to 03/11/2014  FINDINGS: Enlargement of cardiac silhouette with pulmonary vascular congestion.  Bibasilar effusions and atelectasis.  Minimal perihilar edema, improved.  No pneumothorax.  IMPRESSION: Improved CHF with persistent bibasilar effusions and atelectasis.   Electronically Signed   By: Lavonia Dana M.D.   On: 03/17/2014 11:36   Dg Chest Port 1 View  03/11/2014   CLINICAL DATA:  Respiratory failure.  EXAM: PORTABLE CHEST - 1 VIEW  COMPARISON:  02/26/2014.  02/26/2014.  FINDINGS: NG tube noted with tip coiled over stomach. Cardiomegaly with pulmonary vascular prominence interstitial prominence suggesting congestive heart failure. Basilar atelectasis and/or infiltrates. Small right pleural effusion. No pneumothorax. Previously identified probable free air in the hemidiaphragm again noted. No acute osseus abnormality.  IMPRESSION: 1. Interim placement NG tube, its tip is in the stomach. 2. Previously described findings suggesting free intraperitoneal air again noted. 3. Congestive heart failure with mild interstitial edema and small right pleural effusion. 4. Bibasilar atelectasis and/or mild infiltrates.   Electronically Signed   By: Marcello Moores  Register   On: 03/11/2014 07:53   Dg Chest Port 1 View  02/26/2014   CLINICAL DATA:  Pulmonary edema  EXAM: PORTABLE CHEST - 1 VIEW  COMPARISON:  02/13/2014  FINDINGS: Cardiomegaly is noted. Central mild vascular congestion without convincing pulmonary edema. Bilateral small pleural effusion with bilateral basilar atelectasis or infiltrate.  IMPRESSION: Cardiomegaly. Central mild vascular congestion without convincing pulmonary edema. Bilateral small pleural effusion with bilateral basilar atelectasis or infiltrate.   Electronically Signed   By: Lahoma Crocker M.D.   On: 02/26/2014  10:06   Dg Abd Acute W/chest  03/09/2014   CLINICAL DATA:  Abdominal distension and abdominal pain for  4 weeks. History of ruptured bladder.  EXAM: ACUTE ABDOMEN SERIES (ABDOMEN 2 VIEW & CHEST 1 VIEW)  COMPARISON:  02/26/2014.  FINDINGS: Compared to prior, pulmonary aeration has improved although there is still elevation of both hemidiaphragms and low lung volumes with basilar atelectasis. Cardiopericardial silhouette remains enlarged. Monitoring leads project over the chest. No pulmonary edema or focal airspace consolidation identified.  On the upright abdominal images, there is gas that extends across the central diaphragms which may be within colon but is concerning for free air and decubitus radiographs are recommended. This gas-filled structure does not have the expected mural markings for small bowel, large bowel or stomach.  Large amount of stool is present along the ascending colon. The previously seen RIGHT lower quadrant drain has been removed. On the supine radiographs, there appears to be a dilated loop of small bowel extending across the central abdomen, measuring up to 5 cm. On the upright images, this has a fluid level. There is stool and bowel gas extending along the descending colon however because of technical degradation of the study. Rectal gas cannot be evaluated.  IMPRESSION: 1. Findings suspicious for free air in the abdomen. Decubitus radiographs recommended. Critical Value/emergent results were called by telephone at the time of interpretation on 03/09/2014 at 4:49 pm to Dr. Fredia Sorrow , who verbally acknowledged these results. 2. Low volume chest with cardiomegaly. 3. Dilated loop of small bowel measuring up to 5 cm suspicious for obstruction. 4. Interval removal of RIGHT lower quadrant percutaneous strain.   Electronically Signed   By: Dereck Ligas M.D.   On: 03/09/2014 16:51   Dg Abd Decub  03/09/2014   CLINICAL DATA:  Suspected intraperitoneal free air, followup assess.   EXAM: ABDOMEN - 1 VIEW DECUBITUS  COMPARISON:  Abdominal radiographs March 09, 2014 at 1614 hr  FINDINGS: Limited assessment as RIGHT-sided imaged down. Large gaseous distended structure with air-fluid level in mid abdomen, in addition to a few bubbles of potentially intraperitoneal free air.  IMPRESSION: Suspected pneumoperitoneum on this limited RIGHT lateral decubitus examination. Large central abdomen air-fluid level, it is unclear if this reflects bowel/stomach or possible abscess. Recommend CT of the abdomen and pelvis with contrast.   Electronically Signed   By: Elon Alas   On: 03/09/2014 17:44   Dg Abd Portable 1v  03/17/2014   CLINICAL DATA:  Shortness of breath today, ileus, abdominal discomfort  EXAM: PORTABLE ABDOMEN - 1 VIEW  COMPARISON:  Portable exam 1101 hr compared to 03/09/2014  FINDINGS: Gaseous distention of stomach.  Gas and stool throughout colon.  No bowel dilatation or bowel wall thickening.  Scattered atherosclerotic calcifications.  Degenerative disc disease changes lower lumbar spine.  No urinary tract calcification.  IMPRESSION: Gaseous distention of stomach.  Otherwise normal bowel gas pattern.   Electronically Signed   By: Lavonia Dana M.D.   On: 03/17/2014 11:27   Dg Abd Portable 1v  02/18/2014   CLINICAL DATA:  Acute lower abdominal pain and abdominal distention. Right lower quadrant percutaneous abscess drainage 3 days ago, likely secondary to appendiceal rupture.  EXAM: PORTABLE ABDOMEN - 1 VIEW  COMPARISON:  CT abdomen at time of abscess drainage 02/15/2014. Unenhanced CT abdomen and pelvis 02/14/2014, 02/10/2014. One-view abdomen x-ray 02/12/2014, 02/11/2014.  FINDINGS: Abscess drainage catheter in the right lower quadrant. Persistent moderate to marked gaseous distention of a dilated loop of jejunum in the left upper quadrant. Remainder of the small bowel of normal caliber. Moderate gaseous distention of the stomach with  interval nasogastric tube removal. Oral  contrast material from the CT 4 days ago remains in the normal caliber colon. No suggestion of free air on the supine image. Airspace consolidation with air bronchograms medially in the visualized lower lobes, unchanged  IMPRESSION: 1. Stable moderate to marked dilation of a loop of jejunum in the left upper quadrant. As there was no transition point on the prior CT, this likely reflects a localized ileus or atonic segment. 2. Moderate gaseous distention of the stomach with interval NG tube removal. 3. Stable atelectasis versus pneumonia in the medial lower lobes bilaterally.   Electronically Signed   By: Evangeline Dakin M.D.   On: 02/18/2014 10:44    Microbiology: Recent Results (from the past 240 hour(s))  Culture, blood (routine x 2)     Status: None   Collection Time: 03/09/14  3:33 PM  Result Value Ref Range Status   Specimen Description BLOOD RIGHT ARM  Final   Special Requests BOTTLES DRAWN AEROBIC AND ANAEROBIC 6CC BOTTLES  Final   Culture NO GROWTH 5 DAYS  Final   Report Status 03/14/2014 FINAL  Final  Culture, blood (routine x 2)     Status: None   Collection Time: 03/09/14  5:44 PM  Result Value Ref Range Status   Specimen Description BLOOD RIGHT ARM  Final   Special Requests BOTTLES DRAWN AEROBIC AND ANAEROBIC 10CC BOTTLES  Final   Culture NO GROWTH 5 DAYS  Final   Report Status 03/14/2014 FINAL  Final  MRSA PCR Screening     Status: None   Collection Time: 03/10/14  5:47 AM  Result Value Ref Range Status   MRSA by PCR NEGATIVE NEGATIVE Final    Comment:        The GeneXpert MRSA Assay (FDA approved for NASAL specimens only), is one component of a comprehensive MRSA colonization surveillance program. It is not intended to diagnose MRSA infection nor to guide or monitor treatment for MRSA infections.      Labs: Basic Metabolic Panel:  Recent Labs Lab 03/12/14 0304 03/13/14 0259 03/14/14 1525 03/15/14 0230 03/16/14 0322 03/17/14 0026 03/18/14 0605  NA 143  142 142 142 144 139 143  K 4.1 3.6 3.5 3.6 3.9 3.5 3.7  CL 101 103 104 104 106 95* 98  CO2 35* 30 26 38* 32 39* 34*  GLUCOSE 82 94 96 100* 95 91 72  BUN 31* 19 9 7  <5* <5* 6  CREATININE 1.39* 0.99 0.79 0.82 0.78 0.73 0.81  CALCIUM 8.2* 8.2* 8.1* 8.1* 8.4 8.3* 8.2*  MG 2.1 1.8 1.7 1.7 1.5  --   --   PHOS 2.1* 1.4* 1.9*  --   --   --   --    Liver Function Tests:  Recent Labs Lab 03/14/14 1525 03/15/14 0230 03/16/14 0322  AST 27 20 17   ALT 12 13 13   ALKPHOS 67 66 64  BILITOT 0.7 0.4 0.5  PROT 6.2 6.3 6.2  ALBUMIN 2.3* 2.2* 2.3*   No results for input(s): LIPASE, AMYLASE in the last 168 hours. No results for input(s): AMMONIA in the last 168 hours. CBC:  Recent Labs Lab 03/14/14 1525 03/15/14 0230 03/16/14 0322 03/17/14 0026 03/18/14 0605  WBC 8.1 8.8 8.7 7.9 7.4  NEUTROABS 6.2 6.8 6.8  --   --   HGB 8.5* 8.8* 8.5* 9.4* 8.9*  HCT 27.9* 28.4* 28.5* 30.7* 28.6*  MCV 90.9 93.4 92.5 94.8 94.1  PLT 289 276 279 309 291   Cardiac  Enzymes: No results for input(s): CKTOTAL, CKMB, CKMBINDEX, TROPONINI in the last 168 hours. BNP: BNP (last 3 results)  Recent Labs  06/16/13 1554 06/17/13 0537 07/09/13 0555  PROBNP 3560.0* 3222.0* 3842.0*   CBG: No results for input(s): GLUCAP in the last 168 hours.     Signed:  Lacy Duverney PA-C  [facility] 03/18/2014, 11:02 AM  I have personally examined the patient and reviewed the entire database. Agree with the above note and plan as outlined, any necessary changes made. Discussed in detail with the patient regarding the management, plan and dc instructions. Appointment with his PCP scheduled on 03/20/14. Will need to be made, CBC, PT/INR at the follow-up appointment.  RAI,RIPUDEEP M.D. [facility] 03/18/2014, 11:59 AM Pager: [phone]  If 7PM-7AM, please contact night-coverage [email or web address] Password [email or web address]

## 2014-03-19 DIAGNOSIS — F329 Major depressive disorder, single episode, unspecified: Secondary | ICD-10-CM | POA: Diagnosis not present

## 2014-03-19 DIAGNOSIS — Z5181 Encounter for therapeutic drug level monitoring: Secondary | ICD-10-CM | POA: Diagnosis not present

## 2014-03-19 DIAGNOSIS — J449 Chronic obstructive pulmonary disease, unspecified: Secondary | ICD-10-CM | POA: Diagnosis not present

## 2014-03-19 DIAGNOSIS — Z7901 Long term (current) use of anticoagulants: Secondary | ICD-10-CM | POA: Diagnosis not present

## 2014-03-19 DIAGNOSIS — I5030 Unspecified diastolic (congestive) heart failure: Secondary | ICD-10-CM | POA: Diagnosis not present

## 2014-03-19 DIAGNOSIS — K37 Unspecified appendicitis: Secondary | ICD-10-CM | POA: Diagnosis not present

## 2014-03-19 DIAGNOSIS — Z48815 Encounter for surgical aftercare following surgery on the digestive system: Secondary | ICD-10-CM | POA: Diagnosis not present

## 2014-03-19 DIAGNOSIS — I4891 Unspecified atrial fibrillation: Secondary | ICD-10-CM | POA: Diagnosis not present

## 2014-03-19 DIAGNOSIS — Z9981 Dependence on supplemental oxygen: Secondary | ICD-10-CM | POA: Diagnosis not present

## 2014-03-19 DIAGNOSIS — I10 Essential (primary) hypertension: Secondary | ICD-10-CM | POA: Diagnosis not present

## 2014-03-20 ENCOUNTER — Ambulatory Visit (INDEPENDENT_AMBULATORY_CARE_PROVIDER_SITE_OTHER): Payer: Medicare Other | Admitting: Family Medicine

## 2014-03-20 ENCOUNTER — Encounter: Payer: Self-pay | Admitting: Family Medicine

## 2014-03-20 ENCOUNTER — Ambulatory Visit (INDEPENDENT_AMBULATORY_CARE_PROVIDER_SITE_OTHER): Payer: Medicare Other | Admitting: Pharmacist

## 2014-03-20 ENCOUNTER — Other Ambulatory Visit: Payer: Self-pay | Admitting: Family Medicine

## 2014-03-20 ENCOUNTER — Encounter (INDEPENDENT_AMBULATORY_CARE_PROVIDER_SITE_OTHER): Payer: Self-pay

## 2014-03-20 ENCOUNTER — Telehealth: Payer: Self-pay | Admitting: *Deleted

## 2014-03-20 VITALS — BP 130/70 | HR 88 | Resp 16 | Ht 66.0 in | Wt 137.8 lb

## 2014-03-20 DIAGNOSIS — K56609 Unspecified intestinal obstruction, unspecified as to partial versus complete obstruction: Secondary | ICD-10-CM

## 2014-03-20 DIAGNOSIS — N179 Acute kidney failure, unspecified: Secondary | ICD-10-CM

## 2014-03-20 DIAGNOSIS — I482 Chronic atrial fibrillation, unspecified: Secondary | ICD-10-CM

## 2014-03-20 DIAGNOSIS — Z48815 Encounter for surgical aftercare following surgery on the digestive system: Secondary | ICD-10-CM | POA: Diagnosis not present

## 2014-03-20 DIAGNOSIS — F418 Other specified anxiety disorders: Secondary | ICD-10-CM

## 2014-03-20 DIAGNOSIS — Z5181 Encounter for therapeutic drug level monitoring: Secondary | ICD-10-CM

## 2014-03-20 DIAGNOSIS — I4891 Unspecified atrial fibrillation: Secondary | ICD-10-CM

## 2014-03-20 DIAGNOSIS — K5669 Other intestinal obstruction: Secondary | ICD-10-CM

## 2014-03-20 DIAGNOSIS — Z9981 Dependence on supplemental oxygen: Secondary | ICD-10-CM | POA: Diagnosis not present

## 2014-03-20 DIAGNOSIS — Z09 Encounter for follow-up examination after completed treatment for conditions other than malignant neoplasm: Secondary | ICD-10-CM

## 2014-03-20 DIAGNOSIS — J449 Chronic obstructive pulmonary disease, unspecified: Secondary | ICD-10-CM | POA: Diagnosis not present

## 2014-03-20 DIAGNOSIS — I5033 Acute on chronic diastolic (congestive) heart failure: Secondary | ICD-10-CM | POA: Diagnosis not present

## 2014-03-20 DIAGNOSIS — I10 Essential (primary) hypertension: Secondary | ICD-10-CM

## 2014-03-20 DIAGNOSIS — F329 Major depressive disorder, single episode, unspecified: Secondary | ICD-10-CM | POA: Diagnosis not present

## 2014-03-20 DIAGNOSIS — Z7901 Long term (current) use of anticoagulants: Secondary | ICD-10-CM | POA: Diagnosis not present

## 2014-03-20 DIAGNOSIS — I5030 Unspecified diastolic (congestive) heart failure: Secondary | ICD-10-CM | POA: Diagnosis not present

## 2014-03-20 LAB — POCT INR: INR: 1.8

## 2014-03-20 NOTE — Progress Notes (Signed)
   Subjective:    Patient ID: Theodore [name], male    DOB: [birth year], 74 y.o.   MRN: [id number]  HPI Pt in for f/u from hospital D/C for SBO. His appetite is fair, and he denies abdominal pain or distension and reports passage of flatus. Still feels weak Now using oxygen continually as he should have been for some time, c/o leg swelling and shortness of breath with activity Denies fever or chills or productive cough    Review of Systems See HPI Denies recent fever or chills. Denies sinus pressure, nasal congestion, ear pain or sore throat. Denies chest pains, palpitations  Denies abdominal pain, nausea, vomiting,diarrhea or constipation.   Denies dysuria, frequency, hesitancy or incontinence. Denies joint pain, swelling and limitation in mobility. Denies headaches, seizures, numbness, or tingling. Denies uncontrolled  depression, anxiety or insomnia. Denies skin break down or rash.        Objective:   Physical Exam BP 130/70 mmHg  Pulse 88  Resp 16  Ht $R'5\' 6"'Mp$  (1.676 m)  Wt 137 lb 12.8 oz (62.506 kg)  BMI 22.25 kg/m2 Patient alert and oriented and in mild cardiopulmonary distress.Pt on supplemental oxygen with poor exercise tolerance  HEENT: No facial asymmetry, EOMI,   oropharynx pink and moist.  Neck supple no JVD, no mass.  Chest: decreased air entry, bilateral crackles in bases  CVS: S1, S2 no murmurs, no S3.Regular rate.  ABD: Soft non tender. Normal bowel sounds, not distended  Ext: one plus  Edema bilaterally  MS: Adequate ROM spine, shoulders, hips and knees.  Skin: Intact, no ulcerations or rash noted.  Psych: Good eye contact, normal affect. Memory intact not anxious or depressed appearing.  CNS: CN 2-12 intact, power,  normal throughout.no focal deficits noted.        Assessment & Plan:  Acute on chronic diastolic CHF (congestive heart failure) Bibasilar crackles and one plus leg edema, torsemide 3 per day for 3 days including today and  potassium 3 per day for 3 days including today , thenm resume as before one twice daily, cBc, and chem 7 and EGFR on 03/25/2014  Atrial fibrillation, chronic Rate currently controlled, pt maintained on coumadin  Essential hypertension Controlled, no change in medication   COPD (chronic obstructive pulmonary disease) Currently using oxygen continually, which he has n needed for some time, but has been non compliant  SBO (small bowel obstruction) Resolved, discharged from hospital 2 days ago, reports passage of flatus and denies abdominal pain and distension  AKI (acute kidney injury) Repeat lab in next 1 week  Depression with anxiety Controlled, no change in medication   Hospital discharge follow-up Readmitted for small bowel obstruction less than 1 week following most recent d/c , now states though weak with decreased appetite, hopeful that he is on the road to recovery, using oxygen

## 2014-03-20 NOTE — Assessment & Plan Note (Signed)
Bibasilar crackles and one plus leg edema, torsemide 3 per day for 3 days including today and potassium 3 per day for 3 days including today , thenm resume as before one twice daily, cBc, and chem 7 and EGFR on 03/25/2014

## 2014-03-20 NOTE — Telephone Encounter (Signed)
Returned call to [place], RN [facility] advised per discharge instructions on 03/18/14 pt needs INR recheck in 2 days.  Advised to check INR today 03/20/14 and call results to [facility] office since Edrick Oh is off today.

## 2014-03-20 NOTE — Telephone Encounter (Signed)
They need to know when next INR check should be

## 2014-03-20 NOTE — Patient Instructions (Signed)
F/u in 4.5 weeks, call if you need me before  Take torsemide, fluid pill, TWO in the morning and ONE at 2 pm every day , starting today , do this on Saturday and Sunday also.  On Monday GO BACK TO TAKING THE FLUID PILL, TORSEMIDE AS BEFORE , ONE TWICE DAILY   Starting today take POTASSIUM two in the morning and one at 2 pm do this on Saturday and Sunday also On  Monday GO Back to taking potassium on twice daily  Non fasting CBC and chem 7 on Jan 13

## 2014-03-22 NOTE — Assessment & Plan Note (Signed)
Controlled, no change in medication  

## 2014-03-22 NOTE — Assessment & Plan Note (Signed)
Rate currently controlled, pt maintained on coumadin

## 2014-03-22 NOTE — Assessment & Plan Note (Signed)
Readmitted for small bowel obstruction less than 1 week following most recent d/c , now states though weak with decreased appetite, hopeful that he is on the road to recovery, using oxygen

## 2014-03-22 NOTE — Assessment & Plan Note (Signed)
Repeat lab in next 1 week

## 2014-03-22 NOTE — Assessment & Plan Note (Signed)
Resolved, discharged from hospital 2 days ago, reports passage of flatus and denies abdominal pain and distension

## 2014-03-22 NOTE — Assessment & Plan Note (Signed)
Currently using oxygen continually, which he has n needed for some time, but has been non compliant

## 2014-03-23 ENCOUNTER — Telehealth: Payer: Self-pay

## 2014-03-23 NOTE — Telephone Encounter (Signed)
Noted  

## 2014-03-24 ENCOUNTER — Other Ambulatory Visit: Payer: Self-pay | Admitting: Cardiovascular Disease

## 2014-03-24 ENCOUNTER — Telehealth: Payer: Self-pay

## 2014-03-24 ENCOUNTER — Ambulatory Visit (INDEPENDENT_AMBULATORY_CARE_PROVIDER_SITE_OTHER): Payer: Medicare Other | Admitting: *Deleted

## 2014-03-24 ENCOUNTER — Other Ambulatory Visit: Payer: Self-pay | Admitting: Family Medicine

## 2014-03-24 DIAGNOSIS — I5030 Unspecified diastolic (congestive) heart failure: Secondary | ICD-10-CM | POA: Diagnosis not present

## 2014-03-24 DIAGNOSIS — Z5181 Encounter for therapeutic drug level monitoring: Secondary | ICD-10-CM | POA: Diagnosis not present

## 2014-03-24 DIAGNOSIS — Z9981 Dependence on supplemental oxygen: Secondary | ICD-10-CM | POA: Diagnosis not present

## 2014-03-24 DIAGNOSIS — I4891 Unspecified atrial fibrillation: Secondary | ICD-10-CM

## 2014-03-24 DIAGNOSIS — J449 Chronic obstructive pulmonary disease, unspecified: Secondary | ICD-10-CM | POA: Diagnosis not present

## 2014-03-24 DIAGNOSIS — I10 Essential (primary) hypertension: Secondary | ICD-10-CM | POA: Diagnosis not present

## 2014-03-24 DIAGNOSIS — Z7901 Long term (current) use of anticoagulants: Secondary | ICD-10-CM | POA: Diagnosis not present

## 2014-03-24 DIAGNOSIS — F329 Major depressive disorder, single episode, unspecified: Secondary | ICD-10-CM | POA: Diagnosis not present

## 2014-03-24 DIAGNOSIS — Z48815 Encounter for surgical aftercare following surgery on the digestive system: Secondary | ICD-10-CM | POA: Diagnosis not present

## 2014-03-24 LAB — POCT INR: INR: 2.6

## 2014-03-24 MED ORDER — TORSEMIDE 20 MG PO TABS: 40.0000 mg | ORAL_TABLET | Freq: Every day | ORAL | Status: AC

## 2014-03-24 NOTE — Telephone Encounter (Signed)
Received fax refill request  Rx # [phone] Medication:  Torsemide 20 mg tablets Qty 60 Sig:  Take 2 tablets by mouth every day Physician:  Bronson Ing

## 2014-03-24 NOTE — Telephone Encounter (Signed)
Refill complete 

## 2014-03-24 NOTE — Telephone Encounter (Signed)
Potassium was 2.8. Called patient and advised him to to take 2 tabs now and 2 more before bed and we would call tomorrow with further directions. (per dr Moshe Cipro)

## 2014-03-25 ENCOUNTER — Encounter (HOSPITAL_COMMUNITY): Payer: Self-pay | Admitting: *Deleted

## 2014-03-25 ENCOUNTER — Emergency Department (HOSPITAL_COMMUNITY): Payer: Medicare Other

## 2014-03-25 ENCOUNTER — Emergency Department (HOSPITAL_COMMUNITY)
Admission: EM | Admit: 2014-03-25 | Discharge: 2014-03-25 | Disposition: A | Discharge status: Home / Self Care | Payer: Medicare Other | Attending: Emergency Medicine | Admitting: Emergency Medicine

## 2014-03-25 DIAGNOSIS — I10 Essential (primary) hypertension: Secondary | ICD-10-CM | POA: Insufficient documentation

## 2014-03-25 DIAGNOSIS — Z720 Tobacco use: Secondary | ICD-10-CM | POA: Insufficient documentation

## 2014-03-25 DIAGNOSIS — J90 Pleural effusion, not elsewhere classified: Secondary | ICD-10-CM | POA: Diagnosis not present

## 2014-03-25 DIAGNOSIS — J441 Chronic obstructive pulmonary disease with (acute) exacerbation: Secondary | ICD-10-CM | POA: Insufficient documentation

## 2014-03-25 DIAGNOSIS — Z792 Long term (current) use of antibiotics: Secondary | ICD-10-CM | POA: Insufficient documentation

## 2014-03-25 DIAGNOSIS — E876 Hypokalemia: Secondary | ICD-10-CM | POA: Diagnosis not present

## 2014-03-25 DIAGNOSIS — Z7951 Long term (current) use of inhaled steroids: Secondary | ICD-10-CM | POA: Insufficient documentation

## 2014-03-25 DIAGNOSIS — D649 Anemia, unspecified: Secondary | ICD-10-CM | POA: Insufficient documentation

## 2014-03-25 DIAGNOSIS — I5040 Unspecified combined systolic (congestive) and diastolic (congestive) heart failure: Secondary | ICD-10-CM | POA: Insufficient documentation

## 2014-03-25 DIAGNOSIS — R7989 Other specified abnormal findings of blood chemistry: Secondary | ICD-10-CM | POA: Diagnosis present

## 2014-03-25 DIAGNOSIS — R0600 Dyspnea, unspecified: Secondary | ICD-10-CM

## 2014-03-25 DIAGNOSIS — I517 Cardiomegaly: Secondary | ICD-10-CM | POA: Diagnosis not present

## 2014-03-25 DIAGNOSIS — Z8669 Personal history of other diseases of the nervous system and sense organs: Secondary | ICD-10-CM | POA: Diagnosis not present

## 2014-03-25 DIAGNOSIS — F419 Anxiety disorder, unspecified: Secondary | ICD-10-CM | POA: Diagnosis not present

## 2014-03-25 DIAGNOSIS — Z79899 Other long term (current) drug therapy: Secondary | ICD-10-CM | POA: Insufficient documentation

## 2014-03-25 DIAGNOSIS — R0602 Shortness of breath: Secondary | ICD-10-CM | POA: Diagnosis not present

## 2014-03-25 DIAGNOSIS — J811 Chronic pulmonary edema: Secondary | ICD-10-CM | POA: Diagnosis not present

## 2014-03-25 LAB — CBC WITH DIFFERENTIAL/PLATELET
Basophils Absolute: 0 10*3/uL (ref 0.0–0.1)
Basophils Relative: 1 % (ref 0–1)
Eosinophils Absolute: 0.2 10*3/uL (ref 0.0–0.7)
Eosinophils Relative: 3 % (ref 0–5)
HCT: 29.6 % — ABNORMAL LOW (ref 39.0–52.0)
Hemoglobin: 9.1 g/dL — ABNORMAL LOW (ref 13.0–17.0)
LYMPHS ABS: 0.6 10*3/uL — AB (ref 0.7–4.0)
LYMPHS PCT: 11 % — AB (ref 12–46)
MCH: 29.5 pg (ref 26.0–34.0)
MCHC: 30.7 g/dL (ref 30.0–36.0)
MCV: 96.1 fL (ref 78.0–100.0)
Monocytes Absolute: 0.8 10*3/uL (ref 0.1–1.0)
Monocytes Relative: 15 % — ABNORMAL HIGH (ref 3–12)
NEUTROS PCT: 70 % (ref 43–77)
Neutro Abs: 3.6 10*3/uL (ref 1.7–7.7)
PLATELETS: 264 10*3/uL (ref 150–400)
RBC: 3.08 MIL/uL — AB (ref 4.22–5.81)
RDW: 18.4 % — ABNORMAL HIGH (ref 11.5–15.5)
WBC: 5.2 10*3/uL (ref 4.0–10.5)

## 2014-03-25 LAB — PROTIME-INR
INR: 2.5 — ABNORMAL HIGH (ref 0.00–1.49)
PROTHROMBIN TIME: 27.2 s — AB (ref 11.6–15.2)

## 2014-03-25 LAB — BASIC METABOLIC PANEL
Anion gap: 10 (ref 5–15)
BUN: 8 mg/dL (ref 6–23)
CHLORIDE: 90 meq/L — AB (ref 96–112)
CO2: 40 mmol/L (ref 19–32)
Calcium: 8 mg/dL — ABNORMAL LOW (ref 8.4–10.5)
Creatinine, Ser: 0.94 mg/dL (ref 0.50–1.35)
GFR calc non Af Amer: 80 mL/min — ABNORMAL LOW (ref >90)
GLUCOSE: 125 mg/dL — AB (ref 70–99)
Potassium: 3 mmol/L — ABNORMAL LOW (ref 3.5–5.1)
Sodium: 140 mmol/L (ref 135–145)

## 2014-03-25 LAB — TYPE AND SCREEN
ABO/RH(D): O POS
Antibody Screen: NEGATIVE

## 2014-03-25 MED ORDER — POTASSIUM CHLORIDE CRYS ER 20 MEQ PO TBCR
40.0000 meq | EXTENDED_RELEASE_TABLET | Freq: Once | ORAL | Status: AC
  Administered 2014-03-25: 40 meq via ORAL
  Filled 2014-03-25: qty 2

## 2014-03-25 NOTE — Discharge Instructions (Signed)
Anemia, Nonspecific Anemia is a condition in which the concentration of red blood cells or hemoglobin in the blood is below normal. Hemoglobin is a substance in red blood cells that carries oxygen to the tissues of the body. Anemia results in not enough oxygen reaching these tissues.  CAUSES  Common causes of anemia include:   Excessive bleeding. Bleeding may be internal or external. This includes excessive bleeding from periods (in women) or from the intestine.   Poor nutrition.   Chronic kidney, thyroid, and liver disease.  Bone marrow disorders that decrease red blood cell production.  Cancer and treatments for cancer.  HIV, AIDS, and their treatments.  Spleen problems that increase red blood cell destruction.  Blood disorders.  Excess destruction of red blood cells due to infection, medicines, and autoimmune disorders. SIGNS AND SYMPTOMS   Minor weakness.   Dizziness.   Headache.  Palpitations.   Shortness of breath, especially with exercise.   Paleness.  Cold sensitivity.  Indigestion.  Nausea.  Difficulty sleeping.  Difficulty concentrating. Symptoms may occur suddenly or they may develop slowly.  DIAGNOSIS  Additional blood tests are often needed. These help your health care provider determine the best treatment. Your health care provider will check your stool for blood and look for other causes of blood loss.  TREATMENT  Treatment varies depending on the cause of the anemia. Treatment can include:   Supplements of iron, vitamin B12, or folic acid.   Hormone medicines.   A blood transfusion. This may be needed if blood loss is severe.   Hospitalization. This may be needed if there is significant continual blood loss.   Dietary changes.  Spleen removal. HOME CARE INSTRUCTIONS Keep all follow-up appointments. It often takes many weeks to correct anemia, and having your health care provider check on your condition and your response to  treatment is very important. SEEK IMMEDIATE MEDICAL CARE IF:   You develop extreme weakness, shortness of breath, or chest pain.   You become dizzy or have trouble concentrating.  You develop heavy vaginal bleeding.   You develop a rash.   You have bloody or black, tarry stools.   You faint.   You vomit up blood.   You vomit repeatedly.   You have abdominal pain.  You have a fever or persistent symptoms for more than 2-3 days.   You have a fever and your symptoms suddenly get worse.   You are dehydrated.  MAKE SURE YOU:  Understand these instructions.  Will watch your condition.  Will get help right away if you are not doing well or get worse. Document Released: 04/06/2004 Document Revised: 10/30/2012 Document Reviewed: 08/23/2012 ExitCare Patient Information 2015 [employer]. This information is not intended to replace advice given to you by your health care provider. Make sure you discuss any questions you have with your health care provider.  

## 2014-03-25 NOTE — ED Notes (Signed)
CRITICAL VALUE ALERT  Critical value received:  co2 40  Date of notification:  03/25/2013  Time of notification:  7824  Critical value read back:Yes.    Nurse who received alert:  [facility]  MD notified (1st page): Dr. Christy Gentles  Time of first page:  56  MD notified (2nd page):  Time of second page:  Responding MD:  Dr. Christy Gentles  Time MD responded:  1055

## 2014-03-25 NOTE — ED Notes (Signed)
Patient walking around room on RA. Stats 73% on RA. Patient instructed to get back in bed and put oxygen back on. Nurse Crystal and Dr. Christy Gentles informed.

## 2014-03-25 NOTE — ED Provider Notes (Signed)
CSN: [id number]     Arrival date & time 03/25/14  5462 History  This chart was scribed for Theodore Cable, MD by Edison Simon, ED Scribe. This patient was seen in room APA08/APA08 and the patient's care was started at 10:12 AM.    Chief Complaint  Patient presents with  . Abnormal Lab   Patient is a 74 y.o. male presenting with general illness. The history is provided by the patient. No language interpreter was used.  Illness Location:  Diffuse Quality:  Fatigue Severity:  Mild Onset quality:  Gradual Duration:  3 weeks Timing:  Constant Progression:  Unchanged Chronicity:  Recurrent Context:  Low hemoglobin level Relieved by:  None tried Worsened by:  None tried Ineffective treatments:  None tried Associated symptoms: fatigue and shortness of breath   Associated symptoms: no abdominal pain, no chest pain, no headaches and no vomiting     HPI Comments: Theodore [name] is a 74 y.o. male who takes Coumadin and has history of COPD who presents to the Emergency Department complaining of fatigue since prior hospitalization for bladder infection 12/28-1/5; he was referred here today after getting a call from Dr. Griffin Dakin office this morning and being told his hemoglobin count was 7. He states he sometimes feels SOB but notes history of COPD and uses 2L oxygen at home as needed. He denies fever, vomiting, chest pain, headache, abdominal pain, syncope, blood in stool, black stool, or hematuria.  Past Medical History  Diagnosis Date  . Glaucoma     uses eye drops daily  . Allergic rhinitis     takes Singulair at bedtime  . Atrial fibrillation     takes Coumadin daily  . Diastolic heart failure     LVEF 70-35%, rate 2 diastolic dysfunction 0/0938  . Pulmonary hypertension     70 mmHg 03/2012  . Asthma     Albuterol neb and inhaler as needed  . Anxiety     takes Xanax daily as needed  . COPD (chronic obstructive pulmonary disease)     Symbicort daily  . Essential hypertension,  benign     takes Metoprolol and Diltiazem daily  . Anemia     takes ferrous sulfate daily  . History of bronchitis   . Shortness of breath     with exertion  . Numbness     fingertips on both hands  . Lung nodules   . History of blood transfusion    Past Surgical History  Procedure Laterality Date  . Bilateral cataract surgery    . Herniorrhapy    . Tendon repair      Right hand surgical procedure for a tendon repair  . Colonoscopy N/A 11/29/2012    Procedure: COLONOSCOPY;  Surgeon: Danie Binder, MD;  Location: [facility];  Service: Endoscopy;  Laterality: N/A;  1:00  . Esophagogastroduodenoscopy N/A 11/29/2012    Procedure: ESOPHAGOGASTRODUODENOSCOPY (EGD);  Surgeon: Danie Binder, MD;  Location: [facility];  Service: Endoscopy;  Laterality: N/A;  . Eye surgery    . Hernia repair    . Radiology with anesthesia N/A 07/10/2013    Procedure: EMBOLIZATION-RADIOLOGY WITH ANESTHESIA;  Surgeon: Rob Hickman, MD;  Location: [place];  Service: Radiology;  Laterality: N/A;  . Nose surgery      d/t nosebleeds  . Video bronchoscopy with endobronchial ultrasound N/A 11/21/2013    Procedure: VIDEO BRONCHOSCOPY WITH ENDOBRONCHIAL ULTRASOUND;  Surgeon: Melrose Nakayama, MD;  Location: [place];  Service: Thoracic;  Laterality: N/A;  . Mediastinoscopy N/A 11/21/2013    Procedure: MEDIASTINOSCOPY;  Surgeon: Melrose Nakayama, MD;  Location[facility] OR;  Service: Thoracic;  Laterality: N/A;   Family History  Problem Relation Age of Onset  . Cancer Mother   . Cancer Father   . Coronary artery disease Brother   . Colon cancer Brother    History  Substance Use Topics  . Smoking status: Current Some Day Smoker -- 0.50 packs/day for 60 years    Types: Cigarettes  . Smokeless tobacco: Never Used  . Alcohol Use: No    Review of Systems  Constitutional: Positive for fatigue.  Respiratory: Positive for shortness of breath.   Cardiovascular: Negative for chest pain.  Gastrointestinal:  Negative for vomiting, abdominal pain and blood in stool.  Genitourinary: Negative for hematuria.  Neurological: Negative for syncope and headaches.  All other systems reviewed and are negative.     Allergies  Aspirin  Home Medications   Prior to Admission medications   Medication Sig Start Date End Date Taking? Authorizing Provider  albuterol (PROAIR HFA) 108 (90 BASE) MCG/ACT inhaler Inhale 2 puffs into the lungs every 6 (six) hours as needed for wheezing or shortness of breath. 03/02/14  Yes Fayrene Helper, MD  albuterol (PROVENTIL) (2.5 MG/3ML) 0.083% nebulizer solution Take 2.5 mg by nebulization every 8 (eight) hours as needed for wheezing or shortness of breath.   Yes Historical Provider, MD  allopurinol (ZYLOPRIM) 300 MG tablet GIVE "Ramonte" 1 TABLET BY MOUTH EVERY DAILY 03/02/14   Fayrene Helper, MD  ALPRAZolam Duanne Moron) 0.25 MG tablet TAKE 1 TABLET BY MOUTH AS NEEDED FOR ANXIETY 03/24/14   Fayrene Helper, MD  amoxicillin-clavulanate (AUGMENTIN) 875-125 MG per tablet Take 1 tablet by mouth 2 (two) times daily. X 1 week 03/18/14   Ripudeep K Rai, MD  brimonidine-timolol (COMBIGAN) 0.2-0.5 % ophthalmic solution Place 1 drop into both eyes every 12 (twelve) hours.    Historical Provider, MD  budesonide-formoterol (SYMBICORT) 160-4.5 MCG/ACT inhaler Inhale 2 puffs into the lungs 2 (two) times daily. 05/23/13   Fayrene Helper, MD  diltiazem (CARDIZEM CD) 180 MG 24 hr capsule Take 1 capsule (180 mg total) by mouth daily. 09/22/13   Herminio Commons, MD  docusate sodium 100 MG CAPS Take 100 mg by mouth 2 (two) times daily. 03/18/14   Ripudeep Krystal Eaton, MD  ferrous sulfate 325 (65 FE) MG tablet Take 1 tablet (325 mg total) by mouth 2 (two) times daily with a meal. 02/26/14   Debbe Odea, MD  mirtazapine (REMERON) 15 MG tablet Take 1 tablet (15 mg total) by mouth at bedtime. 12/30/13   Fayrene Helper, MD  montelukast (SINGULAIR) 10 MG tablet Take 10 mg by mouth at bedtime.     Historical Provider, MD  potassium chloride (K-DUR) 10 MEQ tablet GIVE "Dvante" 1 TABLET BY MOUTH TWICE DAILY Patient not taking: Reported on 03/20/2014 03/03/14   Fayrene Helper, MD  senna (SENOKOT) 8.6 MG TABS tablet Take 1 tablet (8.6 mg total) by mouth 2 (two) times daily. Patient not taking: Reported on 03/20/2014 03/03/14   Fayrene Helper, MD  temazepam (RESTORIL) 15 MG capsule TAKE 1 CAPSULE BY MOUTH EVERY NIGHT AT BEDTIME AS NEEDED FOR SLEEP 03/20/14   Fayrene Helper, MD  tiotropium (SPIRIVA) 18 MCG inhalation capsule Place 1 capsule (18 mcg total) into inhaler and inhale daily. 03/02/14   Fayrene Helper, MD  torsemide (DEMADEX) 20 MG tablet Take 2  tablets (40 mg total) by mouth daily. 03/24/14   Herminio Commons, MD  traMADol (ULTRAM) 50 MG tablet TAKE 1 TABLET BY MOUTH EVERY DAY AS NEEDED Patient not taking: Reported on 03/09/2014 11/19/13   Fayrene Helper, MD  travoprost, benzalkonium, (TRAVATAN) 0.004 % ophthalmic solution Place 1 drop into both eyes at bedtime.    Historical Provider, MD  traZODone (DESYREL) 50 MG tablet Take 0.5-1 tablets (25-50 mg total) by mouth at bedtime as needed for sleep. 03/13/14   Fayrene Helper, MD  warfarin (COUMADIN) 4 MG tablet Take 1.5 tabs (6 mg) on Monday and Thursday and 1 tab (4mg ) on other days 03/03/14   Herminio Commons, MD   BP 135/72 mmHg  Pulse 102  Temp(Src) 97.5 F (36.4 C) (Oral)  Resp 24 Physical Exam  Nursing note and vitals reviewed.  CONSTITUTIONAL: elderly, frail HEAD: Normocephalic/atraumatic EYES: EOMI/PERRL ENMT: Mucous membranes moist NECK: supple no meningeal signs SPINE/BACK:entire spine nontender CV: S1/S2 noted LUNGS: crackles bilaterally ABDOMEN: soft, nontender, no rebound or guarding, bowel sounds noted throughout abdomen GU:no cva tenderness Rectal: no blood or melena hemoccult negative, chaperone present NEURO: Pt is awake/alert/appropriate, moves all extremitiesx4.  No facial droop.    EXTREMITIES: pulses normal/equal, full ROM SKIN: warm, color normal PSYCH: no abnormalities of mood noted, alert and oriented to situation   ED Course  Procedures   DIAGNOSTIC STUDIES: Oxygen Saturation is 100% on room air, normal by my interpretation.    COORDINATION OF CARE: 10:20 AM Discussed treatment plan with patient at beside, the patient agrees with the plan and has no further questions at this time.   Labs revealed that his anemia is improved He otherwise feels at his baseline on oxygen 2L He would like to be discharged homed BP 121/97 mmHg  Pulse 90  Temp(Src) 99.2 F (37.3 C) (Oral)  Resp 22  SpO2 98%   Labs Review Labs Reviewed  BASIC METABOLIC PANEL - Abnormal; Notable for the following:    Potassium 3.0 (*)    Chloride 90 (*)    CO2 40 (*)    Glucose, Bld 125 (*)    Calcium 8.0 (*)    GFR calc non Af Amer 80 (*)    All other components within normal limits  CBC WITH DIFFERENTIAL - Abnormal; Notable for the following:    RBC 3.08 (*)    Hemoglobin 9.1 (*)    HCT 29.6 (*)    RDW 18.4 (*)    Lymphocytes Relative 11 (*)    Lymphs Abs 0.6 (*)    Monocytes Relative 15 (*)    All other components within normal limits  PROTIME-INR - Abnormal; Notable for the following:    Prothrombin Time 27.2 (*)    INR 2.50 (*)    All other components within normal limits  POC OCCULT BLOOD, ED  TYPE AND SCREEN    Imaging Review Dg Chest Portable 1 View  03/25/2014   CLINICAL DATA:  Shortness of breath.  Decreased hemoglobin.  EXAM: PORTABLE CHEST - 1 VIEW  COMPARISON:  Single view of the chest 03/17/2014.  FINDINGS: There is marked cardiomegaly with vascular congestion. Small bilateral pleural effusions, greater on the right, seen on the prior examination have decreased. There is no pneumothorax.  IMPRESSION: Decreased small bilateral pleural effusions, greater on the right.  Cardiomegaly and vascular congestion.  No new abnormality since the comparison examination.    Electronically Signed   By: Inge Rise M.D.   On: 03/25/2014  10:44     EKG Interpretation   Date/Time:  Wednesday March 25 2014 10:27:40 EST Ventricular Rate:  101 PR Interval:    QRS Duration: 103 QT Interval:  376 QTC Calculation: 487 R Axis:   -75 Text Interpretation:  Atrial fibrillation Paired ventricular premature  complexes LAD, consider LAFB or inferior infarct RSR' in V1 or V2, right  VCD or RVH Consider anterior infarct Repol abnrm suggests ischemia,  lateral leads Baseline wander in lead(s) V3 artifact noted No significant  change since last tracing Confirmed by Christy Gentles  MD, Elenore Rota ([id number]) on  03/25/2014 10:31:53 AM        MDM   Final diagnoses:  Dyspnea  Anemia, unspecified anemia type  Hypokalemia    Nursing notes including past medical history and social history reviewed and considered in documentation xrays/imaging reviewed by myself and considered during evaluation Labs/vital reviewed myself and considered during evaluation Previous records reviewed and considered   I personally performed the services described in this documentation, which was scribed in my presence. The recorded information has been reviewed and is accurate.      Theodore Cable, MD 03/25/14 1515

## 2014-03-25 NOTE — ED Notes (Signed)
Pt received a call from Dr. Griffin Dakin office telling him to come to the ED for evaluation of possible blood transfusion for Hgb of 7.

## 2014-03-25 NOTE — Telephone Encounter (Signed)
Review of labs shows low hb , pt sent to the ED

## 2014-03-26 DIAGNOSIS — I10 Essential (primary) hypertension: Secondary | ICD-10-CM | POA: Diagnosis not present

## 2014-03-26 DIAGNOSIS — I4891 Unspecified atrial fibrillation: Secondary | ICD-10-CM | POA: Diagnosis not present

## 2014-03-26 DIAGNOSIS — Z48815 Encounter for surgical aftercare following surgery on the digestive system: Secondary | ICD-10-CM | POA: Diagnosis not present

## 2014-03-26 DIAGNOSIS — F329 Major depressive disorder, single episode, unspecified: Secondary | ICD-10-CM | POA: Diagnosis not present

## 2014-03-26 DIAGNOSIS — Z9981 Dependence on supplemental oxygen: Secondary | ICD-10-CM | POA: Diagnosis not present

## 2014-03-26 DIAGNOSIS — I5030 Unspecified diastolic (congestive) heart failure: Secondary | ICD-10-CM | POA: Diagnosis not present

## 2014-03-26 DIAGNOSIS — Z7901 Long term (current) use of anticoagulants: Secondary | ICD-10-CM | POA: Diagnosis not present

## 2014-03-26 DIAGNOSIS — Z5181 Encounter for therapeutic drug level monitoring: Secondary | ICD-10-CM | POA: Diagnosis not present

## 2014-03-26 DIAGNOSIS — J449 Chronic obstructive pulmonary disease, unspecified: Secondary | ICD-10-CM | POA: Diagnosis not present

## 2014-03-27 DIAGNOSIS — Z9981 Dependence on supplemental oxygen: Secondary | ICD-10-CM | POA: Diagnosis not present

## 2014-03-27 DIAGNOSIS — Z48815 Encounter for surgical aftercare following surgery on the digestive system: Secondary | ICD-10-CM | POA: Diagnosis not present

## 2014-03-27 DIAGNOSIS — I10 Essential (primary) hypertension: Secondary | ICD-10-CM | POA: Diagnosis not present

## 2014-03-27 DIAGNOSIS — F329 Major depressive disorder, single episode, unspecified: Secondary | ICD-10-CM | POA: Diagnosis not present

## 2014-03-27 DIAGNOSIS — Z7901 Long term (current) use of anticoagulants: Secondary | ICD-10-CM | POA: Diagnosis not present

## 2014-03-27 DIAGNOSIS — I4891 Unspecified atrial fibrillation: Secondary | ICD-10-CM | POA: Diagnosis not present

## 2014-03-27 DIAGNOSIS — I5030 Unspecified diastolic (congestive) heart failure: Secondary | ICD-10-CM | POA: Diagnosis not present

## 2014-03-27 DIAGNOSIS — J449 Chronic obstructive pulmonary disease, unspecified: Secondary | ICD-10-CM | POA: Diagnosis not present

## 2014-03-27 DIAGNOSIS — Z5181 Encounter for therapeutic drug level monitoring: Secondary | ICD-10-CM | POA: Diagnosis not present

## 2014-03-30 ENCOUNTER — Encounter: Payer: Self-pay | Admitting: Adult Health

## 2014-03-30 ENCOUNTER — Ambulatory Visit (INDEPENDENT_AMBULATORY_CARE_PROVIDER_SITE_OTHER): Payer: Medicare Other | Admitting: Adult Health

## 2014-03-30 VITALS — BP 120/62 | HR 80 | Ht 66.0 in | Wt 136.6 lb

## 2014-03-30 DIAGNOSIS — Z48815 Encounter for surgical aftercare following surgery on the digestive system: Secondary | ICD-10-CM | POA: Diagnosis not present

## 2014-03-30 DIAGNOSIS — Z5181 Encounter for therapeutic drug level monitoring: Secondary | ICD-10-CM | POA: Diagnosis not present

## 2014-03-30 DIAGNOSIS — K353 Acute appendicitis with localized peritonitis: Secondary | ICD-10-CM | POA: Diagnosis not present

## 2014-03-30 DIAGNOSIS — E876 Hypokalemia: Secondary | ICD-10-CM

## 2014-03-30 DIAGNOSIS — R5383 Other fatigue: Secondary | ICD-10-CM

## 2014-03-30 DIAGNOSIS — I4891 Unspecified atrial fibrillation: Secondary | ICD-10-CM | POA: Diagnosis not present

## 2014-03-30 DIAGNOSIS — I10 Essential (primary) hypertension: Secondary | ICD-10-CM | POA: Diagnosis not present

## 2014-03-30 DIAGNOSIS — I5030 Unspecified diastolic (congestive) heart failure: Secondary | ICD-10-CM | POA: Diagnosis not present

## 2014-03-30 DIAGNOSIS — Z9981 Dependence on supplemental oxygen: Secondary | ICD-10-CM | POA: Diagnosis not present

## 2014-03-30 DIAGNOSIS — Z7901 Long term (current) use of anticoagulants: Secondary | ICD-10-CM | POA: Diagnosis not present

## 2014-03-30 DIAGNOSIS — F329 Major depressive disorder, single episode, unspecified: Secondary | ICD-10-CM | POA: Diagnosis not present

## 2014-03-30 DIAGNOSIS — J449 Chronic obstructive pulmonary disease, unspecified: Secondary | ICD-10-CM | POA: Diagnosis not present

## 2014-03-30 DIAGNOSIS — R0602 Shortness of breath: Secondary | ICD-10-CM

## 2014-03-30 DIAGNOSIS — K3533 Acute appendicitis with perforation and localized peritonitis, with abscess: Secondary | ICD-10-CM

## 2014-03-30 DIAGNOSIS — R040 Epistaxis: Secondary | ICD-10-CM | POA: Diagnosis not present

## 2014-03-30 LAB — CBC WITH DIFFERENTIAL/PLATELET
Basophils Absolute: 0.1 10*3/uL (ref 0.0–0.1)
Basophils Relative: 1 % (ref 0–1)
EOS ABS: 0.1 10*3/uL (ref 0.0–0.7)
Eosinophils Relative: 2 % (ref 0–5)
HEMATOCRIT: 28 % — AB (ref 39.0–52.0)
HEMOGLOBIN: 8.4 g/dL — AB (ref 13.0–17.0)
LYMPHS ABS: 0.8 10*3/uL (ref 0.7–4.0)
Lymphocytes Relative: 15 % (ref 12–46)
MCH: 27.7 pg (ref 26.0–34.0)
MCHC: 30 g/dL (ref 30.0–36.0)
MCV: 92.4 fL (ref 78.0–100.0)
MONO ABS: 0.7 10*3/uL (ref 0.1–1.0)
MPV: 9.9 fL (ref 8.6–12.4)
Monocytes Relative: 13 % — ABNORMAL HIGH (ref 3–12)
NEUTROS PCT: 69 % (ref 43–77)
Neutro Abs: 3.9 10*3/uL (ref 1.7–7.7)
PLATELETS: 369 10*3/uL (ref 150–400)
RBC: 3.03 MIL/uL — ABNORMAL LOW (ref 4.22–5.81)
RDW: 17 % — ABNORMAL HIGH (ref 11.5–15.5)
WBC: 5.6 10*3/uL (ref 4.0–10.5)

## 2014-03-30 LAB — BASIC METABOLIC PANEL
BUN: 7 mg/dL (ref 6–23)
CO2: 37 mEq/L — ABNORMAL HIGH (ref 19–32)
CREATININE: 0.79 mg/dL (ref 0.50–1.35)
Calcium: 8.7 mg/dL (ref 8.4–10.5)
Chloride: 94 mEq/L — ABNORMAL LOW (ref 96–112)
Glucose, Bld: 80 mg/dL (ref 70–99)
Potassium: 3.8 mEq/L (ref 3.5–5.3)
Sodium: 139 mEq/L (ref 135–145)

## 2014-03-30 LAB — IRON AND TIBC
%SAT: 16 % — AB (ref 20–55)
Iron: 39 ug/dL — ABNORMAL LOW (ref 42–165)
TIBC: 244 ug/dL (ref 215–435)
UIBC: 205 ug/dL (ref 125–400)

## 2014-03-30 NOTE — Progress Notes (Deleted)
Name: Theodore [name]    DOB: Dec 05, 1940  Age: 74 y.o.  MR#: [id number]       PCP:  Tula Nakayama, MD      Insurance: Payor: Onnie Boer MEDICARE / Plan: [facility] MEDICARE / Product Type: *No Product type* /   CC:    Chief Complaint  Patient presents with  . Congestive Heart Failure  . Atrial Fibrillation    VS Filed Vitals:   03/30/14 1309  BP: 120/62  Pulse: 80  Height: '5\' 6"'  (1.676 m)  Weight: 136 lb 9.6 oz (61.961 kg)    Weights Current Weight  03/30/14 136 lb 9.6 oz (61.961 kg)  03/20/14 137 lb 12.8 oz (62.506 kg)  03/17/14 143 lb 15.4 oz (65.3 kg)    Blood Pressure  BP Readings from Last 3 Encounters:  03/30/14 120/62  03/25/14 121/97  03/20/14 130/70     Admit date:  (Not on file) Last encounter with RMR:  Visit date not found   Allergy Aspirin  Current Outpatient Prescriptions  Medication Sig Dispense Refill  . albuterol (PROAIR HFA) 108 (90 BASE) MCG/ACT inhaler Inhale 2 puffs into the lungs every 6 (six) hours as needed for wheezing or shortness of breath. 6.7 g 4  . albuterol (PROVENTIL) (2.5 MG/3ML) 0.083% nebulizer solution Take 2.5 mg by nebulization every 8 (eight) hours as needed for wheezing or shortness of breath.    . allopurinol (ZYLOPRIM) 300 MG tablet GIVE "Regan" 1 TABLET BY MOUTH EVERY DAILY 30 tablet 2  . ALPRAZolam (XANAX) 0.25 MG tablet TAKE 1 TABLET BY MOUTH AS NEEDED FOR ANXIETY 30 tablet 2  . budesonide-formoterol (SYMBICORT) 160-4.5 MCG/ACT inhaler Inhale 2 puffs into the lungs 2 (two) times daily. 1 Inhaler 4  . diltiazem (CARDIZEM CD) 180 MG 24 hr capsule Take 1 capsule (180 mg total) by mouth daily. 30 capsule 6  . docusate sodium 100 MG CAPS Take 100 mg by mouth 2 (two) times daily. 60 capsule 3  . ferrous sulfate 325 (65 FE) MG tablet Take 1 tablet (325 mg total) by mouth 2 (two) times daily with a meal.  3  . mirtazapine (REMERON) 15 MG tablet Take 1 tablet (15 mg total) by mouth at bedtime. 30 tablet 4  . montelukast  (SINGULAIR) 10 MG tablet Take 10 mg by mouth at bedtime.    . potassium chloride (K-DUR) 10 MEQ tablet GIVE "Daronte" 1 TABLET BY MOUTH TWICE DAILY 60 tablet 2  . temazepam (RESTORIL) 15 MG capsule TAKE 1 CAPSULE BY MOUTH EVERY NIGHT AT BEDTIME AS NEEDED FOR SLEEP 30 capsule 2  . tiotropium (SPIRIVA) 18 MCG inhalation capsule Place 1 capsule (18 mcg total) into inhaler and inhale daily. 30 capsule 2  . torsemide (DEMADEX) 20 MG tablet Take 2 tablets (40 mg total) by mouth daily. 60 tablet 3  . traMADol (ULTRAM) 50 MG tablet TAKE 1 TABLET BY MOUTH EVERY DAY AS NEEDED 30 tablet 3  . travoprost, benzalkonium, (TRAVATAN) 0.004 % ophthalmic solution Place 1 drop into both eyes at bedtime.    . traZODone (DESYREL) 50 MG tablet Take 0.5-1 tablets (25-50 mg total) by mouth at bedtime as needed for sleep. 30 tablet 5  . warfarin (COUMADIN) 4 MG tablet Take 1.5 tabs (6 mg) on Monday and Thursday and 1 tab (76m) on other days 40 tablet 3   No current facility-administered medications for this visit.    Discontinued Meds:    Medications Discontinued During This Encounter  Medication Reason  .  amoxicillin-clavulanate (AUGMENTIN) 875-125 MG per tablet Error  . brimonidine-timolol (COMBIGAN) 0.2-0.5 % ophthalmic solution Error  . senna (SENOKOT) 8.6 MG TABS tablet Error    Patient Active Problem List   Diagnosis Date Noted  . Appendiceal abscess   . Abdominal distention   . Acute on chronic respiratory failure with hypoxia   . Atrial fibrillation, chronic   . Acute on chronic diastolic CHF (congestive heart failure)   . Pulmonary hypertension   . Elevated troponin   . Acute renal failure syndrome   . Anemia of chronic disease   . Coagulopathy   . Supratherapeutic INR   . Metabolic encephalopathy   . Anxiety state   . Acute respiratory failure with hypoxia and hypercarbia   . SBO (small bowel obstruction) 03/09/2014  . AKI (acute kidney injury) 03/09/2014  . Encephalopathy 03/09/2014  .  Constipation 03/08/2014  . Hospital discharge follow-up 03/08/2014  . Acute diastolic congestive heart failure   . Hypokalemia 02/24/2014  . Enteritis 02/10/2014  . Appendicitis with abscess   . Need for vaccination with 13-polyvalent pneumococcal conjugate vaccine 01/03/2014  . Lung nodule 11/21/2013  . Abnormal CAT scan 10/10/2013  . Epistaxis, recurrent 07/08/2013  . ARF (acute renal failure) 07/08/2013  . Encounter for therapeutic drug monitoring 07/03/2013  . NSVT (nonsustained ventricular tachycardia) 07/01/2013  . COPD with acute exacerbation 06/24/2013  . Leukocytosis 06/22/2013  . Normocytic anemia 06/18/2013  . Acute on chronic diastolic heart failure 52/77/8242  . Pulmonary arterial hypertension 06/17/2013  . Acute respiratory failure with hypoxia 06/17/2013  . Chronic diastolic CHF (congestive heart failure) 06/16/2013  . Routine general medical examination at a health care facility 05/25/2013  . Hyperuricemia 05/23/2013  . Depression with anxiety 05/21/2013  . Insomnia secondary to anxiety 04/14/2013  . Glaucoma 04/14/2013  . COPD (chronic obstructive pulmonary disease) 04/02/2013  . Epistaxis 04/01/2013  . Acute blood loss anemia 04/01/2013  . 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 Tobacco abuse disorder 12/24/2010  . Encounter for long-term (current) use of other medications 05/15/2010  . Essential hypertension 02/13/2006  . Atrial fibrillation 02/13/2006    LABS    Component Value Date/Time   NA 140 03/25/2014 1022   NA 143 03/18/2014 0605   NA 139 03/17/2014 0026   K 3.0* 03/25/2014 1022   K 3.7 03/18/2014 0605   K 3.5 03/17/2014 0026   CL 90* 03/25/2014 1022   CL 98 03/18/2014 0605   CL 95* 03/17/2014 0026   CO2 40* 03/25/2014 1022   CO2 34* 03/18/2014 0605   CO2 39* 03/17/2014 0026   GLUCOSE 125* 03/25/2014 1022   GLUCOSE 72 03/18/2014 0605   GLUCOSE 91 03/17/2014 0026   BUN 8 03/25/2014 1022   BUN 6  03/18/2014 0605   BUN <5* 03/17/2014 0026   CREATININE 0.94 03/25/2014 1022   CREATININE 0.81 03/18/2014 0605   CREATININE 0.73 03/17/2014 0026   CREATININE 1.07 09/29/2013 1132   CREATININE 1.28 05/27/2013 1644   CREATININE 1.06 05/21/2013 1614   CALCIUM 8.0* 03/25/2014 1022   CALCIUM 8.2* 03/18/2014 0605   CALCIUM 8.3* 03/17/2014 0026   GFRNONAA 80* 03/25/2014 1022   GFRNONAA 85* 03/18/2014 0605   GFRNONAA 89* 03/17/2014 0026   GFRNONAA 68 09/29/2013 1132   GFRAA >90 03/25/2014 1022   GFRAA >90 03/18/2014 0605   GFRAA >90 03/17/2014 0026   GFRAA 79 09/29/2013 1132   CMP     Component Value Date/Time  NA 140 03/25/2014 1022   K 3.0* 03/25/2014 1022   CL 90* 03/25/2014 1022   CO2 40* 03/25/2014 1022   GLUCOSE 125* 03/25/2014 1022   BUN 8 03/25/2014 1022   CREATININE 0.94 03/25/2014 1022   CREATININE 1.07 09/29/2013 1132   CALCIUM 8.0* 03/25/2014 1022   PROT 6.2 03/16/2014 0322   ALBUMIN 2.3* 03/16/2014 0322   AST 17 03/16/2014 0322   ALT 13 03/16/2014 0322   ALKPHOS 64 03/16/2014 0322   BILITOT 0.5 03/16/2014 0322   GFRNONAA 80* 03/25/2014 1022   GFRNONAA 68 09/29/2013 1132   GFRAA >90 03/25/2014 1022   GFRAA 79 09/29/2013 1132       Component Value Date/Time   WBC 5.2 03/25/2014 1022   WBC 7.4 03/18/2014 0605   WBC 7.9 03/17/2014 0026   HGB 9.1* 03/25/2014 1022   HGB 8.9* 03/18/2014 0605   HGB 9.4* 03/17/2014 0026   HCT 29.6* 03/25/2014 1022   HCT 28.6* 03/18/2014 0605   HCT 30.7* 03/17/2014 0026   MCV 96.1 03/25/2014 1022   MCV 94.1 03/18/2014 0605   MCV 94.8 03/17/2014 0026    Lipid Panel     Component Value Date/Time   CHOL  12/19/2009 0620    123        ATP III CLASSIFICATION:  <200     mg/dL   Desirable  200-239  mg/dL   Borderline High  >=240    mg/dL   High          TRIG 68 12/19/2009 0620   HDL 38* 12/19/2009 0620   CHOLHDL 3.2 12/19/2009 0620   VLDL 14 12/19/2009 0620   LDLCALC  12/19/2009 0620    71        Total  Cholesterol/HDL:CHD Risk Coronary Heart Disease Risk Table                     Men   Women  1/2 Average Risk   3.4   3.3  Average Risk       5.0   4.4  2 X Average Risk   9.6   7.1  3 X Average Risk  23.4   11.0        Use the calculated Patient Ratio above and the CHD Risk Table to determine the patient's CHD Risk.        ATP III CLASSIFICATION (LDL):  <100     mg/dL   Optimal  100-129  mg/dL   Near or Above                    Optimal  130-159  mg/dL   Borderline  160-189  mg/dL   High  >190     mg/dL   Very High    ABG    Component Value Date/Time   PHART 7.374 03/17/2014 1200   PCO2ART 73.1* 03/17/2014 1200   PO2ART 182.0* 03/17/2014 1200   HCO3 41.6* 03/17/2014 1200   TCO2 43.9 03/17/2014 1200   O2SAT 100.0 03/17/2014 1200     Lab Results  Component Value Date   TSH 2.610 02/10/2014   BNP (last 3 results)  Recent Labs  06/16/13 1554 06/17/13 0537 07/09/13 0555  PROBNP 3560.0* 3222.0* 3842.0*   Cardiac Panel (last 3 results) No results for input(s): CKTOTAL, CKMB, TROPONINI, RELINDX in the last 72 hours.  Iron/TIBC/Ferritin/ %Sat    Component Value Date/Time   IRON 38* 09/29/2013 1132   TIBC 354 06/18/2013 0528  FERRITIN 133 10/27/2013 1434   IRONPCTSAT 9* 06/18/2013 0528     EKG Orders placed or performed during the hospital encounter of 03/25/14  . ED EKG  . ED EKG  . EKG 12-Lead  . EKG 12-Lead  . EKG     Prior Assessment and Plan Problem List as of 03/30/2014      Cardiovascular and Mediastinum   Atrial fibrillation   Last Assessment & Plan 03/03/2014 Office Visit Written 03/08/2014 11:57 AM by Fayrene Helper, MD    Currently rate controlled, anticoagulation being followed by coumadin clinic      Diastolic CHF with preserved left ventricular function, NYHA class 4   Last Assessment & Plan 05/21/2013 Office Visit Written 05/25/2013  5:19 PM by Fayrene Helper, MD    3 plus bilateral pitting edema, pt has not been taking meds as  prescribed, importance of same stressed and message also to be relayed by the nurse to case managerfor follow up at home      Acute on chronic diastolic heart failure   Last Assessment & Plan 12/30/2013 Office Visit Written 12/30/2013  9:33 AM by Fayrene Helper, MD    Not decompensatred, but re educated re the need to measure and limit fluid and salt intake       Pulmonary arterial hypertension   NSVT (nonsustained ventricular tachycardia)   Essential hypertension   Last Assessment & Plan 03/20/2014 Office Visit Written 03/22/2014  2:37 AM by Fayrene Helper, MD    Controlled, no change in medication       Chronic diastolic CHF (congestive heart failure)   Last Assessment & Plan 09/29/2013 Office Visit Written 01/03/2014 11:51 AM by Fayrene Helper, MD    No symptoms or signs of decompensation at this time Pt to continue salt and water restriction      Acute diastolic congestive heart failure   Atrial fibrillation, chronic   Last Assessment & Plan 03/20/2014 Office Visit Written 03/22/2014  2:37 AM by Fayrene Helper, MD    Rate currently controlled, pt maintained on coumadin      Acute on chronic diastolic CHF (congestive heart failure)   Last Assessment & Plan 03/20/2014 Office Visit Written 03/20/2014 11:26 AM by Fayrene Helper, MD    Bibasilar crackles and one plus leg edema, torsemide 3 per day for 3 days including today and potassium 3 per day for 3 days including today , thenm resume as before one twice daily, cBc, and chem 7 and EGFR on 03/25/2014      Pulmonary hypertension     Respiratory   Epistaxis   Last Assessment & Plan 07/17/2013 Office Visit Written 07/20/2013 11:04 PM by Fayrene Helper, MD    S/p recent hospitalization for recurrent severe epistaxis , hopefully recent vascular intervention will negate recurrence. Cardiology also to re evaluate risk benefit ratio as far as chronic coumadin is concerned.Currently off coumadin      COPD (chronic  obstructive pulmonary disease)   Last Assessment & Plan 03/20/2014 Office Visit Written 03/22/2014  2:38 AM by Fayrene Helper, MD    Currently using oxygen continually, which he has n needed for some time, but has been non compliant      Acute respiratory failure with hypoxia   COPD with acute exacerbation   Epistaxis, recurrent   Acute respiratory failure with hypoxia and hypercarbia   Acute on chronic respiratory failure with hypoxia     Digestive   Enteritis  Appendicitis with abscess   Last Assessment & Plan 03/03/2014 Office Visit Written 03/08/2014 11:53 AM by Fayrene Helper, MD    Recently hospitalized with perforated appendix, fortunately contained in an abcess , and drained percutaneously and treated with IV antibiotics, much improved, however appetite is still recovering      Constipation   Last Assessment & Plan 03/03/2014 Office Visit Written 03/08/2014 11:58 AM by Fayrene Helper, MD    High fiber diet and use of daily softener, as well as daily physical activity discussed      SBO (small bowel obstruction)   Last Assessment & Plan 03/20/2014 Office Visit Written 03/22/2014  2:39 AM by Fayrene Helper, MD    Resolved, discharged from hospital 2 days ago, reports passage of flatus and denies abdominal pain and distension      Appendiceal abscess     Nervous and Auditory   Encephalopathy   Metabolic encephalopathy     Genitourinary   ARF (acute renal failure)   AKI (acute kidney injury)   Last Assessment & Plan 03/20/2014 Office Visit Written 03/22/2014  2:40 AM by Fayrene Helper, MD    Repeat lab in next 1 week      Acute renal failure syndrome     Hematopoietic and Hemostatic   Coagulopathy     Other   Tobacco abuse disorder   Last Assessment & Plan 03/03/2014 Office Visit Written 03/08/2014 11:54 AM by Fayrene Helper, MD    continues to smoke approx 3 per day, trying to quit, hopefully by New year  Patient counseled for approximately 5  minutes regarding the health risks of ongoing nicotine use, specifically all types of cancer, heart disease, stroke and respiratory failure. The options available for help with cessation ,the behavioral changes to assist the process, and the option to either gradully reduce usage  Or abruptly stop.is also discussed. Pt is also encouraged to set specific goals in number of cigarettes used daily, as well as to set a quit date.       Encounter for long-term (current) use of other medications   Acute blood loss anemia   Insomnia secondary to anxiety   Last Assessment & Plan 04/07/2013 Office Visit Written 04/14/2013  8:33 PM by Fayrene Helper, MD    Controlled, no change in medication       Glaucoma   Last Assessment & Plan 09/29/2013 Office Visit Written 01/03/2014 11:54 AM by Fayrene Helper, MD    Importance of regular usre of eye drops and follow up is streessed      Depression with anxiety   Last Assessment & Plan 03/20/2014 Office Visit Written 03/22/2014  2:40 AM by Fayrene Helper, MD    Controlled, no change in medication       Hyperuricemia   Last Assessment & Plan 09/29/2013 Office Visit Written 01/03/2014 11:55 AM by Fayrene Helper, MD    Daily allopurinol keeps this controlled , hence reducing gout flares, continue same      Routine general medical examination at a health care facility   Last Assessment & Plan 05/21/2013 Office Visit Written 05/25/2013  5:18 PM by Fayrene Helper, MD    Annual exam as documented. Counseling done  re healthy lifestyle involving commitment to 150 minutes exercise per week, heart healthy diet, and attaining healthy weight.The importance of adequate sleep also discussed. Regular seat belt use and safe storage  of firearms if patient has them, is also discussed. Changes  in health habits are decided on by the patient with goals and time frames  set for achieving them. Immunization and cancer screening needs are specifically addressed at  this visit. Fall prevention al;so discussed      Normocytic anemia   Leukocytosis   Encounter for therapeutic drug monitoring   Abnormal CAT scan   Last Assessment & Plan 10/10/2013 Office Visit Edited 10/10/2013  2:54 PM by Fayrene Helper, MD    Chronic nicotine use, over 30 pack yearhistory , with new abnormal findings on recent imaging study, need oncology evaluation and management . Pt understands, and he is willing to move forward with the best recommendations per the medical team' He understands there is no established diagnosis at this time, but there is a real concern that he may have lung cancer He will be  accompanied by [name] to the oncology appointment, the dept will schedule his appt by direct contact with her      Lung nodule   Need for vaccination with 13-polyvalent pneumococcal conjugate vaccine   Last Assessment & Plan 09/29/2013 Office Visit Written 01/03/2014 11:56 AM by Fayrene Helper, MD    Vaccine administered at visit.       Hypokalemia   Hospital discharge follow-up   Last Assessment & Plan 03/20/2014 Office Visit Written 03/22/2014  2:41 AM by Fayrene Helper, MD    Readmitted for small bowel obstruction less than 1 week following most recent d/c , now states though weak with decreased appetite, hopeful that he is on the road to recovery, using oxygen      Abdominal distention   Elevated troponin   Anemia of chronic disease   Supratherapeutic INR   Anxiety state       Imaging: Ct Abdomen Pelvis Wo Contrast  03/09/2014   CLINICAL DATA:  Initial evaluation for abdominal distension and nausea. No bowel movement and 4 days. Recent perforated appendix.  EXAM: CT ABDOMEN AND PELVIS WITHOUT CONTRAST  TECHNIQUE: Multidetector CT imaging of the abdomen and pelvis was performed following the standard protocol without IV contrast.  COMPARISON:  Prior radiograph from earlier the same day as well as prior CT from 02/20/2014.  FINDINGS: Small layering bilateral  pleural effusions with associated bibasilar opacity present, which may reflect atelectasis or consolidation. There is prominent cardiomegaly. Scattered coronary artery and mitral valvular calcifications present. No pericardial effusion.  Limited noncontrast evaluation of the liver is within normal limits. Gallbladder are mildly distended without associated inflammatory changes. No biliary dilatation. Spleen, adrenal glands, and pancreas within normal limits.  Kidneys within normal limits without nephrolithiasis or hydronephrosis.  Stomach is markedly distended with air contrast level present 6 cm in diameter with scattered internal air-fluid levels. There is an apparent transition point within knee lower right abdomen (series 2, image 57). Findings compatible with acute small bowel obstruction. The small bowel is fluid filled distally but of normal caliber. The colon is decompressed. Moderate amount of retained stool within the ascending and proximal transverse colon. No free intraperitoneal air. Small volume ascites present adjacent to the liver.  Previously seen percutaneous pigtail drain has been removed from the right lower quadrant.  Bladder decompressed with a Foley catheter in place. Prostate normal.  Right inguinal lymph nodes measuring up to 1.3 cm in short axis noted, similar to prior, and may be reactive in nature. No other pathologically enlarged intra-abdominal pelvic lymph nodes identified. Present Moderate atheromatous plaque present within the intra-abdominal aorta and its branch vessels. No aneurysm.  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 No acute osseus abnormality. No worrisome lytic or blastic osseous lesions.  IMPRESSION: 1. Multiple prominent dilated loops of proximal and mid small bowel within the upper and mid abdomen, markedly increased in caliber relative to recent CT from 02/24/2014. There is a suspected transition point in the right lower quadrant,  concerning for mechanical small bowel obstruction. No free intraperitoneal air. 2. Small volume free perihepatic fluid. 3. Small bilateral pleural effusions with associated bibasilar atelectasis/consolidation. 4. Right inguinal adenopathy measuring up to 1.3 cm in short axis, similar to prior. These nodes are suspected to be reactive in nature. 5. Moderate cardiomegaly.   Electronically Signed   By: Jeannine Boga M.D.   On: 03/09/2014 21:16   Ct Head Wo Contrast  03/10/2014   CLINICAL DATA:  Encephalopathy  EXAM: CT HEAD WITHOUT CONTRAST  TECHNIQUE: Contiguous axial images were obtained from the base of the skull through the vertex without intravenous contrast.  COMPARISON:  10/03/2013  FINDINGS: Motion degraded images.  No evidence of parenchymal hemorrhage or extra-axial fluid collection. No mass lesion, mass effect, or midline shift.  No CT evidence of acute infarction.  Subcortical white matter and periventricular small vessel ischemic changes. Intracranial atherosclerosis.  Age related atrophy.  No ventriculomegaly.  The visualized paranasal sinuses are essentially clear. The mastoid air cells are unopacified.  No evidence of calvarial fracture.  IMPRESSION: Motion degraded images.  No evidence of acute intracranial abnormality.  Atrophy with small vessel ischemic changes and intracranial atherosclerosis.   Electronically Signed   By: Julian Hy M.D.   On: 03/10/2014 00:10   Dg Chest Portable 1 View  03/25/2014   CLINICAL DATA:  Shortness of breath.  Decreased hemoglobin.  EXAM: PORTABLE CHEST - 1 VIEW  COMPARISON:  Single view of the chest 03/17/2014.  FINDINGS: There is marked cardiomegaly with vascular congestion. Small bilateral pleural effusions, greater on the right, seen on the prior examination have decreased. There is no pneumothorax.  IMPRESSION: Decreased small bilateral pleural effusions, greater on the right.  Cardiomegaly and vascular congestion.  No new abnormality since the  comparison examination.   Electronically Signed   By: Inge Rise M.D.   On: 03/25/2014 10:44   Dg Chest Port 1 View  03/17/2014   CLINICAL DATA:  Shortness of breath today, abdominal discomfort, ileus  EXAM: PORTABLE CHEST - 1 VIEW  COMPARISON:  Portable exam 1103 hr compared to 03/11/2014  FINDINGS: Enlargement of cardiac silhouette with pulmonary vascular congestion.  Bibasilar effusions and atelectasis.  Minimal perihilar edema, improved.  No pneumothorax.  IMPRESSION: Improved CHF with persistent bibasilar effusions and atelectasis.   Electronically Signed   By: Lavonia Dana M.D.   On: 03/17/2014 11:36   Dg Chest Port 1 View  03/11/2014   CLINICAL DATA:  Respiratory failure.  EXAM: PORTABLE CHEST - 1 VIEW  COMPARISON:  02/26/2014.  02/26/2014.  FINDINGS: NG tube noted with tip coiled over stomach. Cardiomegaly with pulmonary vascular prominence interstitial prominence suggesting congestive heart failure. Basilar atelectasis and/or infiltrates. Small right pleural effusion. No pneumothorax. Previously identified probable free air in the hemidiaphragm again noted. No acute osseus abnormality.  IMPRESSION: 1. Interim placement NG tube, its tip is in the stomach. 2. Previously described findings suggesting free intraperitoneal air again noted. 3. Congestive heart failure with mild interstitial edema and small right pleural effusion. 4. Bibasilar atelectasis and/or mild infiltrates.   Electronically Signed   By: Marcello Moores  Register  On: 03/11/2014 07:53   Dg Abd Acute W/chest  03/09/2014   CLINICAL DATA:  Abdominal distension and abdominal pain for 4 weeks. History of ruptured bladder.  EXAM: ACUTE ABDOMEN SERIES (ABDOMEN 2 VIEW & CHEST 1 VIEW)  COMPARISON:  02/26/2014.  FINDINGS: Compared to prior, pulmonary aeration has improved although there is still elevation of both hemidiaphragms and low lung volumes with basilar atelectasis. Cardiopericardial silhouette remains enlarged. Monitoring leads project  over the chest. No pulmonary edema or focal airspace consolidation identified.  On the upright abdominal images, there is gas that extends across the central diaphragms which may be within colon but is concerning for free air and decubitus radiographs are recommended. This gas-filled structure does not have the expected mural markings for small bowel, large bowel or stomach.  Large amount of stool is present along the ascending colon. The previously seen RIGHT lower quadrant drain has been removed. On the supine radiographs, there appears to be a dilated loop of small bowel extending across the central abdomen, measuring up to 5 cm. On the upright images, this has a fluid level. There is stool and bowel gas extending along the descending colon however because of technical degradation of the study. Rectal gas cannot be evaluated.  IMPRESSION: 1. Findings suspicious for free air in the abdomen. Decubitus radiographs recommended. Critical Value/emergent results were called by telephone at the time of interpretation on 03/09/2014 at 4:49 pm to Dr. Fredia Sorrow , who verbally acknowledged these results. 2. Low volume chest with cardiomegaly. 3. Dilated loop of small bowel measuring up to 5 cm suspicious for obstruction. 4. Interval removal of RIGHT lower quadrant percutaneous strain.   Electronically Signed   By: Dereck Ligas M.D.   On: 03/09/2014 16:51   Dg Abd Decub  03/09/2014   CLINICAL DATA:  Suspected intraperitoneal free air, followup assess.  EXAM: ABDOMEN - 1 VIEW DECUBITUS  COMPARISON:  Abdominal radiographs March 09, 2014 at 1614 hr  FINDINGS: Limited assessment as RIGHT-sided imaged down. Large gaseous distended structure with air-fluid level in mid abdomen, in addition to a few bubbles of potentially intraperitoneal free air.  IMPRESSION: Suspected pneumoperitoneum on this limited RIGHT lateral decubitus examination. Large central abdomen air-fluid level, it is unclear if this reflects  bowel/stomach or possible abscess. Recommend CT of the abdomen and pelvis with contrast.   Electronically Signed   By: Elon Alas   On: 03/09/2014 17:44   Dg Abd Portable 1v  03/17/2014   CLINICAL DATA:  Shortness of breath today, ileus, abdominal discomfort  EXAM: PORTABLE ABDOMEN - 1 VIEW  COMPARISON:  Portable exam 1101 hr compared to 03/09/2014  FINDINGS: Gaseous distention of stomach.  Gas and stool throughout colon.  No bowel dilatation or bowel wall thickening.  Scattered atherosclerotic calcifications.  Degenerative disc disease changes lower lumbar spine.  No urinary tract calcification.  IMPRESSION: Gaseous distention of stomach.  Otherwise normal bowel gas pattern.   Electronically Signed   By: Lavonia Dana M.D.   On: 03/17/2014 11:27

## 2014-03-30 NOTE — Assessment & Plan Note (Signed)
Currently very well controlled.  Heart rate.  He remains on Coumadin therapy.most recent INR during ER visit on 03/25/2014 demonstrated INR 2.50.  Home health nurse is going by this week to check his INR status.  I am going to order a CBC in the setting of anemia for comparison.  He denies any tarry stools, bleeding or epistaxis  the Taxus.  He is on iron replacement. With use of anticoagulant.  This will need to be followed closely.

## 2014-03-30 NOTE — Progress Notes (Signed)
HPI: [name] is a 74 year old male patient of Dr. Bronson [name], that we follow for ongoing assessment and management of chronic mixed CHF, with chronic diastolic heart failure, and right heart failure, atrial fibrillation with CHADS Vasc Score of 3, on Coumadin therapy.  The patient was recently seen in [facility] ER request of Dr. Moshe [name] for hemoglobin of 7.0.He also has a history of lung nodules, but is not scheduled for surgical intervention.  He was recently admitted to the hospital and December of 2015 for small bowel obstruction.   Theodore [name] states that he is not going to have an appendectomy.  He has spoken with his surgeon and has decided against proceeding with surgery. He is also recently been seen in the emergency room on 03/25/2014 in the setting of overall weakness.  The patient's potassium was 3.0.  His hemoglobin was 9.1.  He was increased in his potassium dose to 20 mEq twice a day.  He had no active bleeding.   He states, he is doing well.  He feels like he is on too much medicine.  But otherwise he is without complaint.  He is followed by home health nurses, who monitored his Coumadin dosing.on evaluation in the emergency room his hemoglobin was 9.1.  He did not require blood transfusion.  Allergies  Allergen Reactions  . Aspirin Other (See Comments)    On coumadin    Current Outpatient Prescriptions  Medication Sig Dispense Refill  . albuterol (PROAIR HFA) 108 (90 BASE) MCG/ACT inhaler Inhale 2 puffs into the lungs every 6 (six) hours as needed for wheezing or shortness of breath. 6.7 g 4  . albuterol (PROVENTIL) (2.5 MG/3ML) 0.083% nebulizer solution Take 2.5 mg by nebulization every 8 (eight) hours as needed for wheezing or shortness of breath.    . allopurinol (ZYLOPRIM) 300 MG tablet GIVE "Theodore [name]" 1 TABLET BY MOUTH EVERY DAILY 30 tablet 2  . ALPRAZolam (XANAX) 0.25 MG tablet TAKE 1 TABLET BY MOUTH AS NEEDED FOR ANXIETY 30 tablet 2  . budesonide-formoterol (SYMBICORT)  160-4.5 MCG/ACT inhaler Inhale 2 puffs into the lungs 2 (two) times daily. 1 Inhaler 4  . diltiazem (CARDIZEM CD) 180 MG 24 hr capsule Take 1 capsule (180 mg total) by mouth daily. 30 capsule 6  . docusate sodium 100 MG CAPS Take 100 mg by mouth 2 (two) times daily. 60 capsule 3  . ferrous sulfate 325 (65 FE) MG tablet Take 1 tablet (325 mg total) by mouth 2 (two) times daily with a meal.  3  . mirtazapine (REMERON) 15 MG tablet Take 1 tablet (15 mg total) by mouth at bedtime. 30 tablet 4  . montelukast (SINGULAIR) 10 MG tablet Take 10 mg by mouth at bedtime.    . potassium chloride (K-DUR) 10 MEQ tablet GIVE "Theodore [name]" 1 TABLET BY MOUTH TWICE DAILY 60 tablet 2  . temazepam (RESTORIL) 15 MG capsule TAKE 1 CAPSULE BY MOUTH EVERY NIGHT AT BEDTIME AS NEEDED FOR SLEEP 30 capsule 2  . tiotropium (SPIRIVA) 18 MCG inhalation capsule Place 1 capsule (18 mcg total) into inhaler and inhale daily. 30 capsule 2  . torsemide (DEMADEX) 20 MG tablet Take 2 tablets (40 mg total) by mouth daily. 60 tablet 3  . traMADol (ULTRAM) 50 MG tablet TAKE 1 TABLET BY MOUTH EVERY DAY AS NEEDED 30 tablet 3  . travoprost, benzalkonium, (TRAVATAN) 0.004 % ophthalmic solution Place 1 drop into both eyes at bedtime.    . traZODone (DESYREL) 50 MG tablet Take 0.5-1  tablets (25-50 mg total) by mouth at bedtime as needed for sleep. 30 tablet 5  . warfarin (COUMADIN) 4 MG tablet Take 1.5 tabs (6 mg) on Monday and Thursday and 1 tab (4mg ) on other days 40 tablet 3   No current facility-administered medications for this visit.    Past Medical History  Diagnosis Date  . Glaucoma     uses eye drops daily  . Allergic rhinitis     takes Singulair at bedtime  . Atrial fibrillation     takes Coumadin daily  . Diastolic heart failure     LVEF 71-06%, rate 2 diastolic dysfunction 04/6946  . Pulmonary hypertension     70 mmHg 03/2012  . Asthma     Albuterol neb and inhaler as needed  . Anxiety     takes Xanax daily as needed  . COPD  (chronic obstructive pulmonary disease)     Symbicort daily  . Essential hypertension, benign     takes Metoprolol and Diltiazem daily  . Anemia     takes ferrous sulfate daily  . History of bronchitis   . Shortness of breath     with exertion  . Numbness     fingertips on both hands  . Lung nodules   . History of blood transfusion     Past Surgical History  Procedure Laterality Date  . Bilateral cataract surgery    . Herniorrhapy    . Tendon repair      Right hand surgical procedure for a tendon repair  . Colonoscopy N/A 11/29/2012    Procedure: COLONOSCOPY;  Surgeon: Theodore Binder, MD;  [facility];  Service: Endoscopy;  Laterality: N/A;  1:00  . Esophagogastroduodenoscopy N/A 11/29/2012    Procedure: ESOPHAGOGASTRODUODENOSCOPY (EGD);  Surgeon: Theodore Binder, MD;  [facility];  Service: Endoscopy;  Laterality: N/A;  . Eye surgery    . Hernia repair    . Radiology with anesthesia N/A 07/10/2013    Procedure: EMBOLIZATION-RADIOLOGY WITH ANESTHESIA;  Surgeon: Theodore Hickman, MD;  Location: [facility];  Service: Radiology;  Laterality: N/A;  . Nose surgery      d/t nosebleeds  . Video bronchoscopy with endobronchial ultrasound N/A 11/21/2013    Procedure: VIDEO BRONCHOSCOPY WITH ENDOBRONCHIAL ULTRASOUND;  Surgeon: Theodore Nakayama, MD;  Location: [facility];  Service: Thoracic;  Laterality: N/A;  . Mediastinoscopy N/A 11/21/2013    Procedure: MEDIASTINOSCOPY;  Surgeon: Theodore Nakayama, MD;  Location: [facility];  Service: Thoracic;  Laterality: N/A;    ROS: Complete review of systems performed and found to be negative unless outlined above  PHYSICAL EXAM BP 120/62 mmHg  Pulse 80  Ht 5\' 6"  (1.676 m)  Wt 136 lb 9.6 oz (61.961 kg)  BMI 22.06 kg/m2 General: Well developed, well nourished, in no acute distress Head: Eyes PERRLA, No xanthomas.   Normal cephalic and atramatic  Lungs:Bibasilar crackles,no wheezes or rhonchi. Heart: HRRR S1 S2, without MRG.   Pulses are 2+ & equal.            No carotid bruit. No JVD.  No abdominal bruits. No femoral bruits. Abdomen: Bowel sounds are positive, abdomen soft and non-tender without masses or                  Hernia's noted. Msk:  Back normal, normal gait. Normal strength and tone for age. Extremities: No clubbing, cyanosis or edema.  DP +1 Neuro: Alert and oriented X 3.HOH Psych:  Good  affect, responds appropriately  ASSESSMENT AND PLAN

## 2014-03-30 NOTE — Assessment & Plan Note (Signed)
He states he has elected to be treated medically with antibiotics.  He and the surgeon decided that he does not have to undergo appendectomy at this time.  If surgery is needed, will need to hold Coumadin for 3-4 days until INR is therapeutic and safe to proceed with surgical intervention if necessary.  From a cardiovascular standpoint he is at low risk. If he does undergo general anesthesia, and lung issues will be his greatest concern.

## 2014-03-30 NOTE — Assessment & Plan Note (Signed)
There is no evidence of fluid overload currently.  His lungs do have some bibasal crackles chronically with history of COPD.  There is no fluid retention, abdominal distention.  Recently, his potassium is been increased to 20 mEq twice a day in the setting of hypokalemia.  Recently seen in the emergency room on 03/25/2004.  I will repeat his BMET to evaluate his current status.

## 2014-03-30 NOTE — Assessment & Plan Note (Signed)
Excellent control of blood pressure.  He is on diltiazem for heart rate and blood pressure control, both of which are found to be normal.  I will continue him on his current medication regimen.

## 2014-03-30 NOTE — Patient Instructions (Signed)
Your physician wants you to follow-up in: 6 months with Jory Sims, NP. You will receive a reminder letter in the mail two months in advance. If you don't receive a letter, please call our office to schedule the follow-up appointment.  Your physician recommends that you continue on your current medications as directed. Please refer to the Current Medication list given to you today.  Your physician recommends that you return for lab work CBC, BMET, Anemia Panel   Thank you for choosing [facility]!

## 2014-03-31 DIAGNOSIS — Z7901 Long term (current) use of anticoagulants: Secondary | ICD-10-CM | POA: Diagnosis not present

## 2014-03-31 DIAGNOSIS — I10 Essential (primary) hypertension: Secondary | ICD-10-CM | POA: Diagnosis not present

## 2014-03-31 DIAGNOSIS — J449 Chronic obstructive pulmonary disease, unspecified: Secondary | ICD-10-CM | POA: Diagnosis not present

## 2014-03-31 DIAGNOSIS — I4891 Unspecified atrial fibrillation: Secondary | ICD-10-CM | POA: Diagnosis not present

## 2014-03-31 DIAGNOSIS — Z5181 Encounter for therapeutic drug level monitoring: Secondary | ICD-10-CM | POA: Diagnosis not present

## 2014-03-31 DIAGNOSIS — F329 Major depressive disorder, single episode, unspecified: Secondary | ICD-10-CM | POA: Diagnosis not present

## 2014-03-31 DIAGNOSIS — Z48815 Encounter for surgical aftercare following surgery on the digestive system: Secondary | ICD-10-CM | POA: Diagnosis not present

## 2014-03-31 DIAGNOSIS — Z9981 Dependence on supplemental oxygen: Secondary | ICD-10-CM | POA: Diagnosis not present

## 2014-03-31 DIAGNOSIS — I5030 Unspecified diastolic (congestive) heart failure: Secondary | ICD-10-CM | POA: Diagnosis not present

## 2014-03-31 LAB — FOLATE: Folate: 5.6 ng/mL

## 2014-03-31 LAB — FERRITIN: Ferritin: 237 ng/mL (ref 22–322)

## 2014-03-31 LAB — VITAMIN B12: VITAMIN B 12: 695 pg/mL (ref 211–911)

## 2014-04-01 ENCOUNTER — Ambulatory Visit (INDEPENDENT_AMBULATORY_CARE_PROVIDER_SITE_OTHER): Payer: Medicare Other | Admitting: *Deleted

## 2014-04-01 ENCOUNTER — Ambulatory Visit: Payer: Medicare Other | Admitting: Family Medicine

## 2014-04-01 ENCOUNTER — Other Ambulatory Visit: Payer: Self-pay | Admitting: Family Medicine

## 2014-04-01 DIAGNOSIS — I5030 Unspecified diastolic (congestive) heart failure: Secondary | ICD-10-CM | POA: Diagnosis not present

## 2014-04-01 DIAGNOSIS — Z5181 Encounter for therapeutic drug level monitoring: Secondary | ICD-10-CM

## 2014-04-01 DIAGNOSIS — I4891 Unspecified atrial fibrillation: Secondary | ICD-10-CM

## 2014-04-01 DIAGNOSIS — F329 Major depressive disorder, single episode, unspecified: Secondary | ICD-10-CM | POA: Diagnosis not present

## 2014-04-01 DIAGNOSIS — Z48815 Encounter for surgical aftercare following surgery on the digestive system: Secondary | ICD-10-CM | POA: Diagnosis not present

## 2014-04-01 DIAGNOSIS — J449 Chronic obstructive pulmonary disease, unspecified: Secondary | ICD-10-CM | POA: Diagnosis not present

## 2014-04-01 DIAGNOSIS — Z9981 Dependence on supplemental oxygen: Secondary | ICD-10-CM | POA: Diagnosis not present

## 2014-04-01 DIAGNOSIS — I10 Essential (primary) hypertension: Secondary | ICD-10-CM | POA: Diagnosis not present

## 2014-04-01 DIAGNOSIS — Z7901 Long term (current) use of anticoagulants: Secondary | ICD-10-CM | POA: Diagnosis not present

## 2014-04-01 LAB — POCT INR: INR: 3.8

## 2014-04-02 DIAGNOSIS — I5030 Unspecified diastolic (congestive) heart failure: Secondary | ICD-10-CM | POA: Diagnosis not present

## 2014-04-02 DIAGNOSIS — Z9981 Dependence on supplemental oxygen: Secondary | ICD-10-CM | POA: Diagnosis not present

## 2014-04-02 DIAGNOSIS — F329 Major depressive disorder, single episode, unspecified: Secondary | ICD-10-CM | POA: Diagnosis not present

## 2014-04-02 DIAGNOSIS — I10 Essential (primary) hypertension: Secondary | ICD-10-CM | POA: Diagnosis not present

## 2014-04-02 DIAGNOSIS — Z7901 Long term (current) use of anticoagulants: Secondary | ICD-10-CM | POA: Diagnosis not present

## 2014-04-02 DIAGNOSIS — Z5181 Encounter for therapeutic drug level monitoring: Secondary | ICD-10-CM | POA: Diagnosis not present

## 2014-04-02 DIAGNOSIS — Z48815 Encounter for surgical aftercare following surgery on the digestive system: Secondary | ICD-10-CM | POA: Diagnosis not present

## 2014-04-02 DIAGNOSIS — I4891 Unspecified atrial fibrillation: Secondary | ICD-10-CM | POA: Diagnosis not present

## 2014-04-02 DIAGNOSIS — J449 Chronic obstructive pulmonary disease, unspecified: Secondary | ICD-10-CM | POA: Diagnosis not present

## 2014-04-06 DIAGNOSIS — I5030 Unspecified diastolic (congestive) heart failure: Secondary | ICD-10-CM | POA: Diagnosis not present

## 2014-04-06 DIAGNOSIS — I10 Essential (primary) hypertension: Secondary | ICD-10-CM | POA: Diagnosis not present

## 2014-04-06 DIAGNOSIS — Z7901 Long term (current) use of anticoagulants: Secondary | ICD-10-CM | POA: Diagnosis not present

## 2014-04-06 DIAGNOSIS — F329 Major depressive disorder, single episode, unspecified: Secondary | ICD-10-CM | POA: Diagnosis not present

## 2014-04-06 DIAGNOSIS — J449 Chronic obstructive pulmonary disease, unspecified: Secondary | ICD-10-CM | POA: Diagnosis not present

## 2014-04-06 DIAGNOSIS — Z5181 Encounter for therapeutic drug level monitoring: Secondary | ICD-10-CM | POA: Diagnosis not present

## 2014-04-06 DIAGNOSIS — I4891 Unspecified atrial fibrillation: Secondary | ICD-10-CM | POA: Diagnosis not present

## 2014-04-06 DIAGNOSIS — Z9981 Dependence on supplemental oxygen: Secondary | ICD-10-CM | POA: Diagnosis not present

## 2014-04-06 DIAGNOSIS — Z48815 Encounter for surgical aftercare following surgery on the digestive system: Secondary | ICD-10-CM | POA: Diagnosis not present

## 2014-04-08 ENCOUNTER — Ambulatory Visit (INDEPENDENT_AMBULATORY_CARE_PROVIDER_SITE_OTHER): Payer: Medicare Other | Admitting: *Deleted

## 2014-04-08 ENCOUNTER — Telehealth: Payer: Self-pay

## 2014-04-08 ENCOUNTER — Telehealth: Payer: Self-pay | Admitting: *Deleted

## 2014-04-08 DIAGNOSIS — I4891 Unspecified atrial fibrillation: Secondary | ICD-10-CM

## 2014-04-08 DIAGNOSIS — Z5181 Encounter for therapeutic drug level monitoring: Secondary | ICD-10-CM

## 2014-04-08 DIAGNOSIS — Z7901 Long term (current) use of anticoagulants: Secondary | ICD-10-CM | POA: Diagnosis not present

## 2014-04-08 DIAGNOSIS — Z9981 Dependence on supplemental oxygen: Secondary | ICD-10-CM | POA: Diagnosis not present

## 2014-04-08 DIAGNOSIS — I10 Essential (primary) hypertension: Secondary | ICD-10-CM | POA: Diagnosis not present

## 2014-04-08 DIAGNOSIS — F329 Major depressive disorder, single episode, unspecified: Secondary | ICD-10-CM | POA: Diagnosis not present

## 2014-04-08 DIAGNOSIS — J449 Chronic obstructive pulmonary disease, unspecified: Secondary | ICD-10-CM | POA: Diagnosis not present

## 2014-04-08 DIAGNOSIS — I5030 Unspecified diastolic (congestive) heart failure: Secondary | ICD-10-CM | POA: Diagnosis not present

## 2014-04-08 DIAGNOSIS — Z48815 Encounter for surgical aftercare following surgery on the digestive system: Secondary | ICD-10-CM | POA: Diagnosis not present

## 2014-04-08 LAB — POCT INR: INR: 4.2

## 2014-04-08 NOTE — Telephone Encounter (Signed)
See coumadin note. 

## 2014-04-08 NOTE — Telephone Encounter (Signed)
Noted, will monitor.

## 2014-04-08 NOTE — Telephone Encounter (Signed)
inr 4.2 , no missed doses

## 2014-04-13 ENCOUNTER — Telehealth: Payer: Self-pay | Admitting: Family Medicine

## 2014-04-13 DIAGNOSIS — Z9981 Dependence on supplemental oxygen: Secondary | ICD-10-CM

## 2014-04-13 DIAGNOSIS — D509 Iron deficiency anemia, unspecified: Secondary | ICD-10-CM

## 2014-04-13 DIAGNOSIS — J449 Chronic obstructive pulmonary disease, unspecified: Secondary | ICD-10-CM

## 2014-04-13 NOTE — Telephone Encounter (Addendum)
Pt has iron deficiency anemia despite oral iron, pls explain to pt and Alyssa, that I am referring him to hematology to see if he will benefit from IV iron, I have entered the referral for hoim to be seen at the [facility] specialty clinic ( hematology) On review he i already seen there regularly, not certain if for this, and he has an appt already scheduled there for 2/12, I am still leaving in the referral however

## 2014-04-15 ENCOUNTER — Telehealth: Payer: Self-pay

## 2014-04-15 ENCOUNTER — Ambulatory Visit (INDEPENDENT_AMBULATORY_CARE_PROVIDER_SITE_OTHER): Payer: Medicare Other | Admitting: *Deleted

## 2014-04-15 DIAGNOSIS — Z7901 Long term (current) use of anticoagulants: Secondary | ICD-10-CM | POA: Diagnosis not present

## 2014-04-15 DIAGNOSIS — I5030 Unspecified diastolic (congestive) heart failure: Secondary | ICD-10-CM | POA: Diagnosis not present

## 2014-04-15 DIAGNOSIS — I480 Paroxysmal atrial fibrillation: Secondary | ICD-10-CM

## 2014-04-15 DIAGNOSIS — Z5181 Encounter for therapeutic drug level monitoring: Secondary | ICD-10-CM | POA: Diagnosis not present

## 2014-04-15 DIAGNOSIS — Z9981 Dependence on supplemental oxygen: Secondary | ICD-10-CM | POA: Diagnosis not present

## 2014-04-15 DIAGNOSIS — I4891 Unspecified atrial fibrillation: Secondary | ICD-10-CM | POA: Diagnosis not present

## 2014-04-15 DIAGNOSIS — Z48815 Encounter for surgical aftercare following surgery on the digestive system: Secondary | ICD-10-CM | POA: Diagnosis not present

## 2014-04-15 DIAGNOSIS — J449 Chronic obstructive pulmonary disease, unspecified: Secondary | ICD-10-CM | POA: Diagnosis not present

## 2014-04-15 DIAGNOSIS — I10 Essential (primary) hypertension: Secondary | ICD-10-CM | POA: Diagnosis not present

## 2014-04-15 DIAGNOSIS — F329 Major depressive disorder, single episode, unspecified: Secondary | ICD-10-CM | POA: Diagnosis not present

## 2014-04-15 LAB — POCT INR: INR: 2.1

## 2014-04-15 NOTE — Telephone Encounter (Addendum)
Need to let us know if he is having leg swelling, with this much weight gain in 1 week, lI suggest reminding pt to limit water and salt intake , and increase demadex to 3 daily for the next two days, will need to inc the potassium by one extra tablet for those 2 days as well

## 2014-04-15 NOTE — Telephone Encounter (Signed)
Is this ok for a 1 week period.  He is on remeron

## 2014-04-15 NOTE — Telephone Encounter (Signed)
Spoke with home health nurse and she states that she checked patient for edema and found none.  He is also not having any shortness of breath.  He does state to her that his appetite has increased greatly.  Would you like to still increase fluid pill.

## 2014-04-15 NOTE — Telephone Encounter (Signed)
Home health nurse Tabitha made aware.

## 2014-04-15 NOTE — Telephone Encounter (Signed)
Leave fluid pill as is, she needs to call if she is concerned about heart failure issues

## 2014-04-15 NOTE — Telephone Encounter (Signed)
Patient aware.

## 2014-04-20 ENCOUNTER — Other Ambulatory Visit: Payer: Self-pay | Admitting: Family Medicine

## 2014-04-23 ENCOUNTER — Ambulatory Visit: Payer: Self-pay | Admitting: *Deleted

## 2014-04-23 DIAGNOSIS — Z5181 Encounter for therapeutic drug level monitoring: Secondary | ICD-10-CM

## 2014-04-23 DIAGNOSIS — I480 Paroxysmal atrial fibrillation: Secondary | ICD-10-CM

## 2014-04-24 ENCOUNTER — Other Ambulatory Visit (HOSPITAL_COMMUNITY): Payer: Medicare Other

## 2014-04-24 ENCOUNTER — Ambulatory Visit (HOSPITAL_COMMUNITY): Payer: Medicare Other | Admitting: Hematology & Oncology

## 2014-04-24 NOTE — Progress Notes (Deleted)
Tula Nakayama, MD [address] [place] [address]    DIAGNOSIS:Severe Anemia, dating back to at least 2014 with clear worsening over last 2 years Pulmonary nodules/mediastinal adeopathy with bronchoscopy/transbronchial ultrasound guided lymph node biopsy with no evidence of mlignancy 11/21/2013  CURRENT THERAPY:  INTERVAL HISTORY: Theodore [name] 74 y.o. male returns for   MEDICAL HISTORY: Past Medical History  Diagnosis Date  . Glaucoma     uses eye drops daily  . Allergic rhinitis     takes Singulair at bedtime  . Atrial fibrillation     takes Coumadin daily  . Diastolic heart failure     LVEF 56-38%, rate 2 diastolic dysfunction 09/5641  . Pulmonary hypertension     70 mmHg 03/2012  . Asthma     Albuterol neb and inhaler as needed  . Anxiety     takes Xanax daily as needed  . COPD (chronic obstructive pulmonary disease)     Symbicort daily  . Essential hypertension, benign     takes Metoprolol and Diltiazem daily  . Anemia     takes ferrous sulfate daily  . History of bronchitis   . Shortness of breath     with exertion  . Numbness     fingertips on both hands  . Lung nodules   . History of blood transfusion     has Essential hypertension; Atrial fibrillation; Encounter for long-term (current) use of other medications; Tobacco abuse disorder; Diastolic CHF with preserved left ventricular function, NYHA class 4; Epistaxis; Acute blood loss anemia; COPD (chronic obstructive pulmonary disease); Insomnia secondary to anxiety; Glaucoma; Depression with anxiety; Hyperuricemia; Routine general medical examination at a health care facility; Chronic diastolic CHF (congestive heart failure); Acute on chronic diastolic heart failure; Pulmonary arterial hypertension; Acute respiratory failure with hypoxia; Normocytic anemia; Leukocytosis; COPD with acute exacerbation; NSVT (nonsustained ventricular tachycardia); Encounter for therapeutic drug monitoring;  Epistaxis, recurrent; ARF (acute renal failure); Abnormal CAT scan; Lung nodule; Need for vaccination with 13-polyvalent pneumococcal conjugate vaccine; Enteritis; Appendicitis with abscess; Hypokalemia; Acute diastolic congestive heart failure; Constipation; Hospital discharge follow-up; SBO (small bowel obstruction); AKI (acute kidney injury); Encephalopathy; Acute respiratory failure with hypoxia and hypercarbia; Abdominal distention; Acute on chronic respiratory failure with hypoxia; Atrial fibrillation, chronic; Acute on chronic diastolic CHF (congestive heart failure); Pulmonary hypertension; Elevated troponin; Acute renal failure syndrome; Anemia of chronic disease; Coagulopathy; Supratherapeutic INR; Metabolic encephalopathy; Anxiety state; and Appendiceal abscess on his problem list.     is allergic to aspirin.  Theodore [name] does not currently have medications on file.  SURGICAL HISTORY: Past Surgical History  Procedure Laterality Date  . Bilateral cataract surgery    . Herniorrhapy    . Tendon repair      Right hand surgical procedure for a tendon repair  . Colonoscopy N/A 11/29/2012    Procedure: COLONOSCOPY;  Surgeon: Danie Binder, MD;  [facility];  Service: Endoscopy;  Laterality: N/A;  1:00  . Esophagogastroduodenoscopy N/A 11/29/2012    Procedure: ESOPHAGOGASTRODUODENOSCOPY (EGD);  Surgeon: Danie Binder, MD;  [facility];  Service: Endoscopy;  Laterality: N/A;  . Eye surgery    . Hernia repair    . Radiology with anesthesia N/A 07/10/2013    Procedure: EMBOLIZATION-RADIOLOGY WITH ANESTHESIA;  Surgeon: Rob Hickman, MD;  Location: [facility];  Service: Radiology;  Laterality: N/A;  . Nose surgery      d/t nosebleeds  . Video bronchoscopy with endobronchial ultrasound  N/A 11/21/2013    Procedure: VIDEO BRONCHOSCOPY WITH ENDOBRONCHIAL ULTRASOUND;  Surgeon: Melrose Nakayama, MD;  Location: [facility];  Service: Thoracic;  Laterality: N/A;  . Mediastinoscopy  N/A 11/21/2013    Procedure: MEDIASTINOSCOPY;  Surgeon: Melrose Nakayama, MD;  Location: [facility];  Service: Thoracic;  Laterality: N/A;    SOCIAL HISTORY: History   Social History  . Marital Status: Widowed    Spouse Name: N/A  . Number of Children: 2  . Years of Education: N/A   Occupational History  . Retired   .     Social History Main Topics  . Smoking status: Current Some Day Smoker -- 0.50 packs/day for 60 years    Types: Cigarettes  . Smokeless tobacco: Never Used  . Alcohol Use: No  . Drug Use: No  . Sexual Activity: No   Other Topics Concern  . Not on file   Social History Narrative    FAMILY HISTORY: Family History  Problem Relation Age of Onset  . Cancer Mother   . Cancer Father   . Coronary artery disease Brother   . Colon cancer Brother     ROS  PHYSICAL EXAMINATION  ECOG PERFORMANCE STATUS: {CHL ONC ECOG PS:[phone]}  There were no vitals filed for this visit.  Physical Exam  LABORATORY DATA:  CBC    Component Value Date/Time   WBC 5.6 03/30/2014 1419   RBC 3.03* 03/30/2014 1419   HGB 8.4* 03/30/2014 1419   HCT 28.0* 03/30/2014 1419   PLT 369 03/30/2014 1419   MCV 92.4 03/30/2014 1419   MCH 27.7 03/30/2014 1419   MCHC 30.0 03/30/2014 1419   RDW 17.0* 03/30/2014 1419   LYMPHSABS 0.8 03/30/2014 1419   MONOABS 0.7 03/30/2014 1419   EOSABS 0.1 03/30/2014 1419   BASOSABS 0.1 03/30/2014 1419   CMP     Component Value Date/Time   NA 139 03/30/2014 1419   K 3.8 03/30/2014 1419   CL 94* 03/30/2014 1419   CO2 37* 03/30/2014 1419   GLUCOSE 80 03/30/2014 1419   BUN 7 03/30/2014 1419   CREATININE 0.79 03/30/2014 1419   CREATININE 0.94 03/25/2014 1022   CALCIUM 8.7 03/30/2014 1419   PROT 6.2 03/16/2014 0322   ALBUMIN 2.3* 03/16/2014 0322   AST 17 03/16/2014 0322   ALT 13 03/16/2014 0322   ALKPHOS 64 03/16/2014 0322   BILITOT 0.5 03/16/2014 0322   GFRNONAA 80* 03/25/2014 1022   GFRNONAA 68 09/29/2013 1132   GFRAA >90  03/25/2014 1022   GFRAA 79 09/29/2013 1132     PENDING LABS:   RADIOGRAPHIC STUDIES:  No results found.   PATHOLOGY:     ASSESSMENT and THERAPY PLAN:    No problem-specific assessment & plan notes found for this encounter.   All questions were answered. The patient knows to call the clinic with any problems, questions or concerns. We can certainly see the patient much sooner if necessary. This note was electronically signed. Molli Hazard 04/24/2014     This encounter was created in error - please disregard.

## 2014-04-26 DIAGNOSIS — J449 Chronic obstructive pulmonary disease, unspecified: Secondary | ICD-10-CM | POA: Diagnosis not present

## 2014-04-26 NOTE — Progress Notes (Signed)
Erroneous encounter

## 2014-04-27 ENCOUNTER — Ambulatory Visit: Payer: Medicare Other | Admitting: Family Medicine

## 2014-05-12 DEATH — deceased

## 2014-06-08 NOTE — Progress Notes (Signed)
Mr. Persley expired on 05-09-2014.

## 2014-06-08 NOTE — Progress Notes (Signed)
Patient expired 05-02-2014  [facility]  [address] [place], [place] [address] [email or web address]   [phone] (mobile)  [phone] (main office) [phone] (fax)

## 2014-06-08 NOTE — Progress Notes (Signed)
Patient expired.

## 2015-10-27 IMAGING — CT CT ABD-PELV W/ CM
2 of 5 series · 11 of 46 positions shown, 12 images · IV contrast (Iodine)
Comparison: 02/15/2014

CLINICAL DATA: Ruptured appendicitis

EXAM:
CT ABDOMEN AND PELVIS WITH CONTRAST
TECHNIQUE: Multidetector CT imaging of the abdomen and pelvis was performed
using the standard protocol following bolus administration of
intravenous contrast.
CONTRAST:  100mL OMNIPAQUE IOHEXOL 300 MG/ML  SOLN

[Series 201: routine, idose (2) · axial · 0.88mm/px · z∈[-426,-76]mm · 8 of 86 slices shown, 9 images]
[im 11/86  soft-tissue]
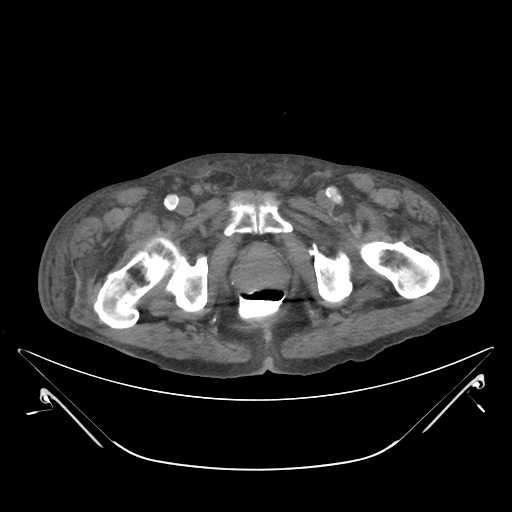
[im 11/86  bone]
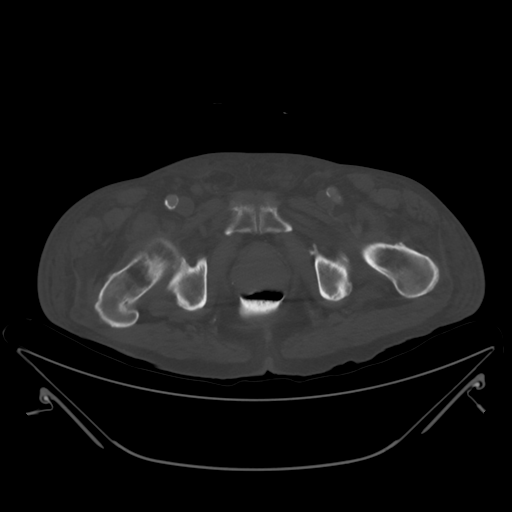
[im 21/86  soft-tissue]
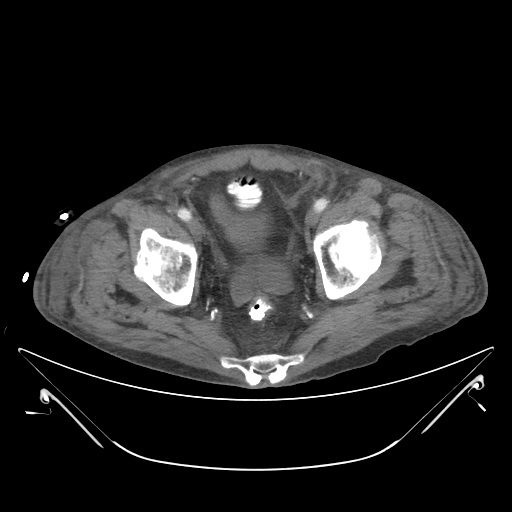
[im 31/86  soft-tissue]
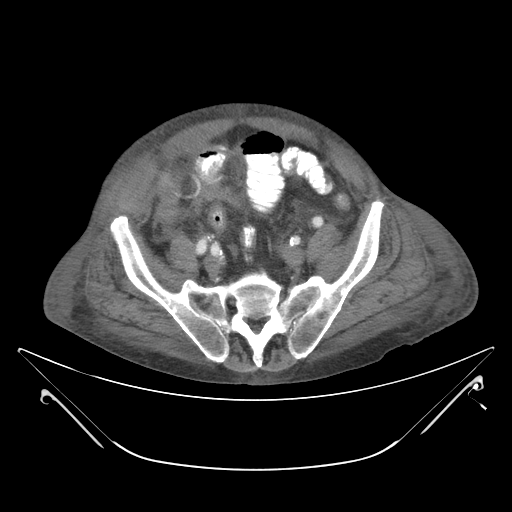
[im 41/86  soft-tissue]
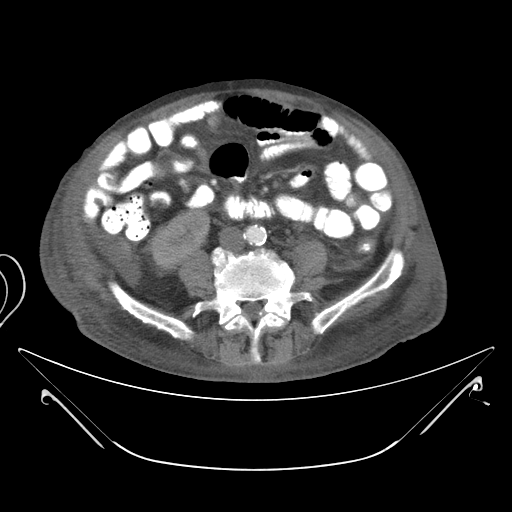
[im 51/86  soft-tissue]
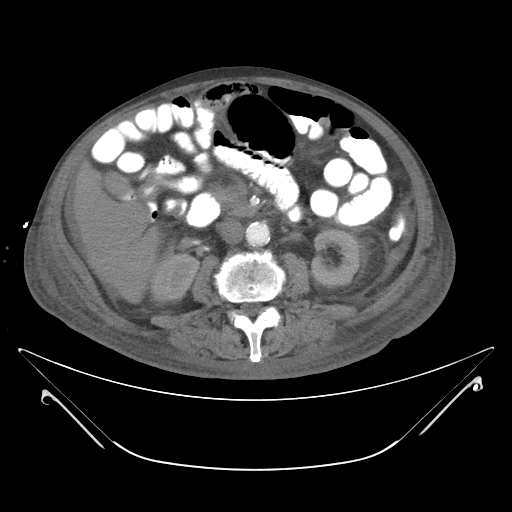
[im 61/86  soft-tissue]
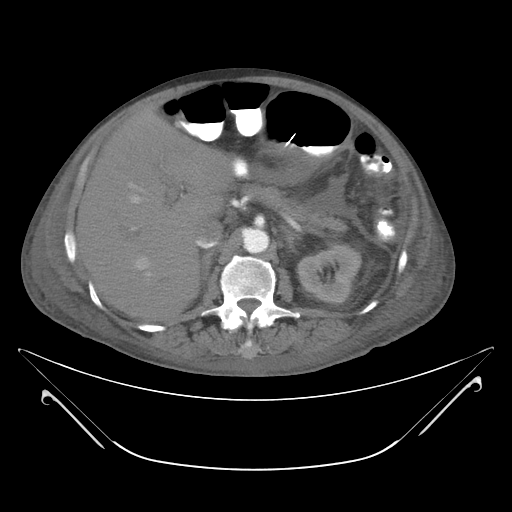
[im 71/86  soft-tissue]
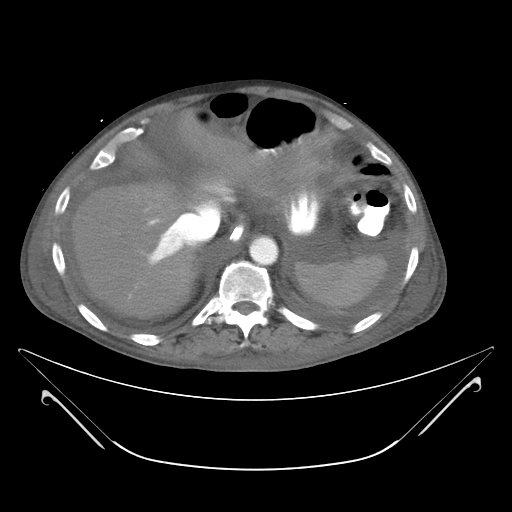
[im 81/86  soft-tissue]
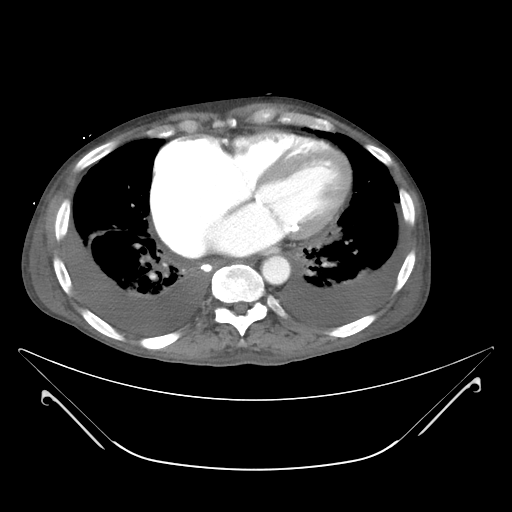

[Series 202: coronals, idose (2) · coronal · 0.50mm/px · 3 of 108 slices shown]
[im 36/108  soft-tissue]
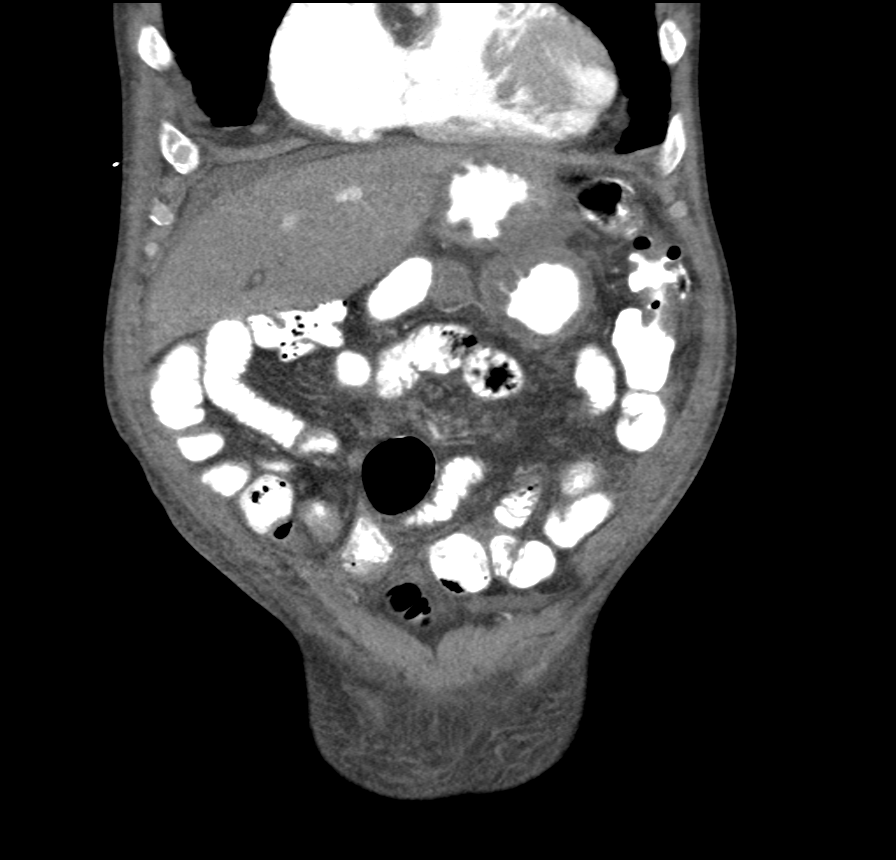
[im 48/108  soft-tissue]
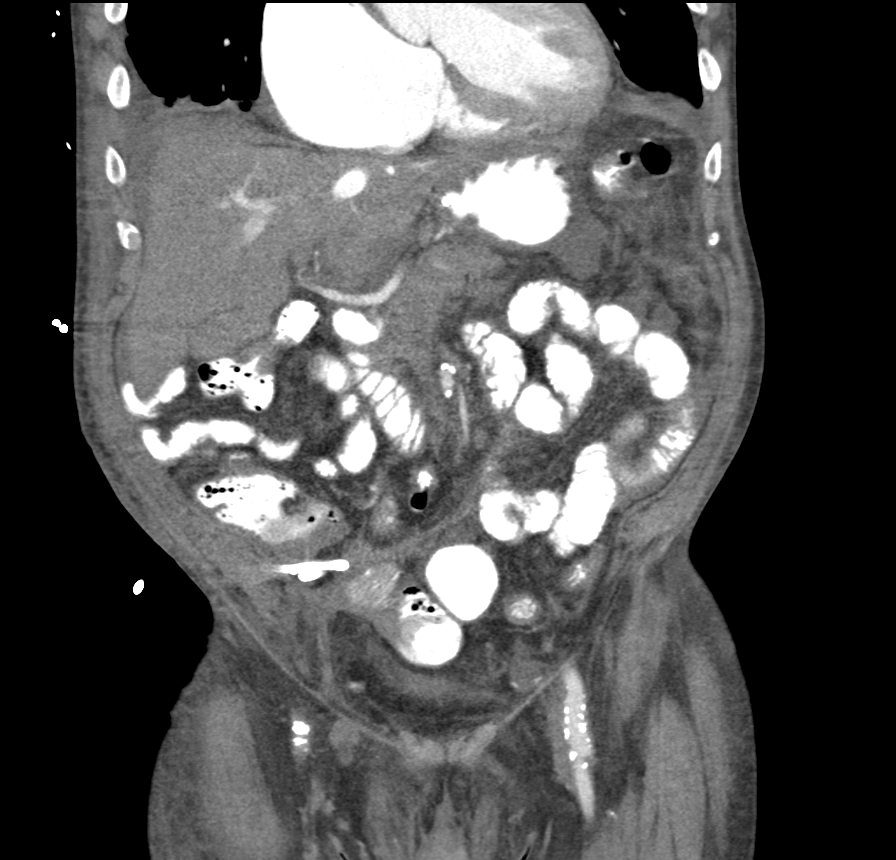
[im 60/108  soft-tissue]
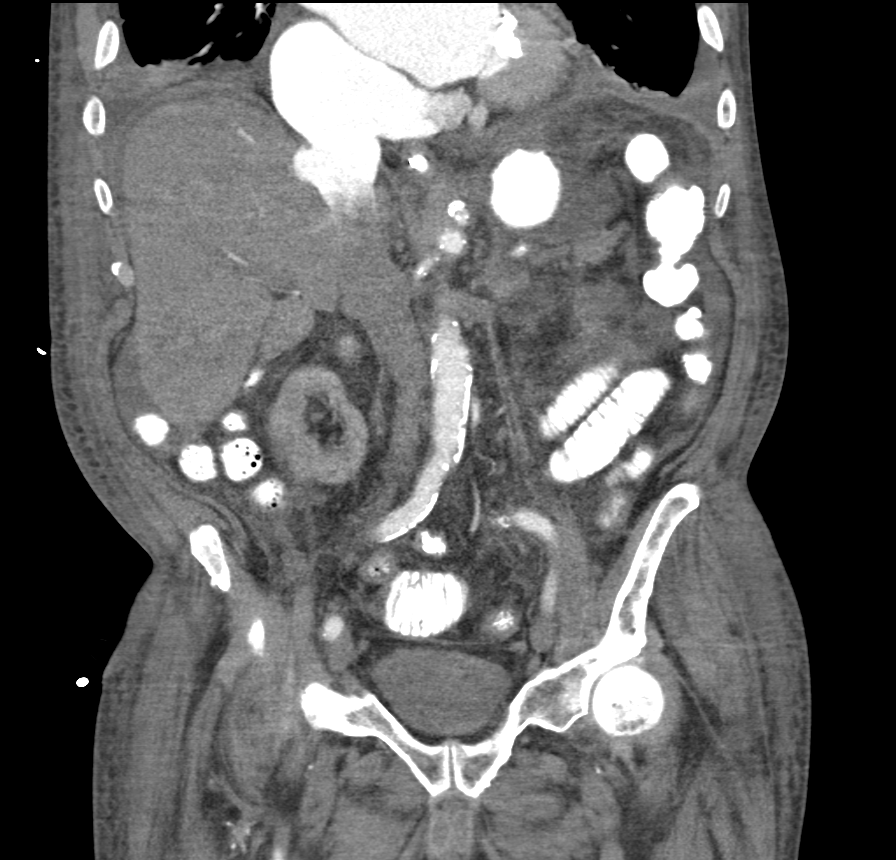

[11 of 46 positions shown; findings below may reference images not displayed]

FINDINGS: Lower chest: Moderate cardiac enlargement. Moderate bilateral
pleural effusions are identified. Atelectasis is noted within both
lower lobes.

Hepatobiliary: Reflux of contrast material from the right side of
heart into the hepatic veins identified compatible with passive
venous congestion of the liver. No focal liver abnormality noted.
Gallbladder is negative. No biliary dilatation.

Pancreas: Appears normal.

Spleen: Normal.

Adrenals/Urinary Tract: The adrenal glands are both normal.
Unremarkable appearance of the kidneys an urinary bladder.

Stomach/Bowel: The stomach appears normal. There is an NG tube
within the body of stomach. The small bowel loops are mildly
increased in caliber measuring up to 3.7 cm. There is enteric
contrast material throughout the small and large bowel loops up to
the level of the rectum. Improvement in distal small bowel wall
thickening. Right lower quadrant pigtail drainage catheter is
identified. There has been resolution of the periappendiceal
abscess. No new fluid collections identified.

Vascular/Lymphatic: Calcified atherosclerotic disease involves the
abdominal aorta. No aneurysm. No enlarged retroperitoneal or
mesenteric adenopathy. No enlarged pelvic or inguinal lymph nodes.

Reproductive: Prostate gland and seminal vesicles appear normal.

Other: Moderate abdominal and pelvic ascites identified.

Musculoskeletal: Multi level degenerative disc disease noted
throughout the lower lumbar spine. No aggressive lytic or sclerotic
bone lesions identified. There are bilateral L5 pars defects noted.
IMPRESSION: 1. Resolution of right lower quadrant abscess status post drainage
placement.
2. Moderate abdominal and pelvic ascites. There is also moderate
bilateral pleural effusions and reflux of contrast material from the
right atrium into the hepatic veins. Findings are concerning for
moderate right heart failure.
3. Suspect continued small bowel enteritis with mild increase
caliber of small bowel loops without evidence for bowel obstruction.

## 2015-11-21 IMAGING — CR DG CHEST 1V PORT
1 series · 1 of 1 positions shown · non-contrast
Comparison: Portable exam 8848 hr compared to 03/11/2014

CLINICAL DATA: Shortness of breath today, abdominal discomfort,
ileus

EXAM:
PORTABLE CHEST - 1 VIEW

[AP]
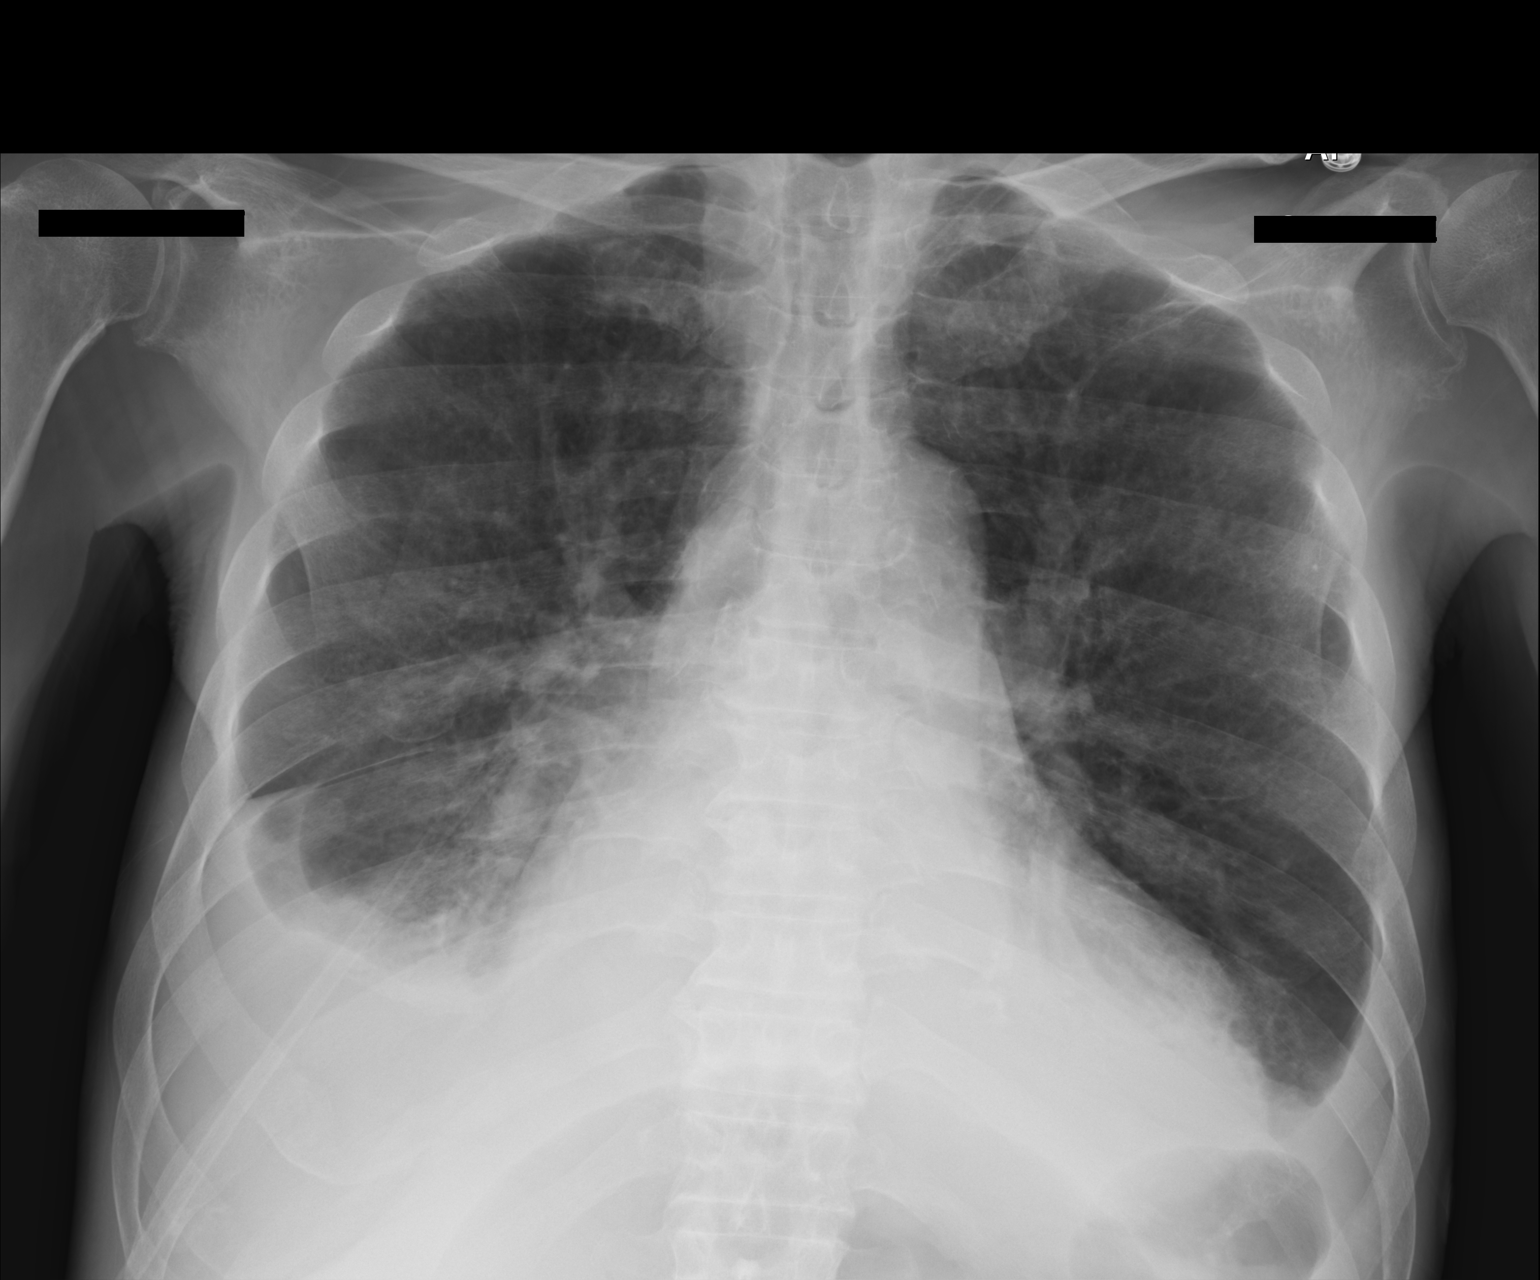

[1 of 1 positions shown; findings below may reference images not displayed]

FINDINGS: Enlargement of cardiac silhouette with pulmonary vascular
congestion.

Bibasilar effusions and atelectasis.

Minimal perihilar edema, improved.

No pneumothorax.
IMPRESSION: Improved CHF with persistent bibasilar effusions and atelectasis.
